# Patient Record
Sex: Female | Born: 1955 | Race: White | Hispanic: No | Marital: Single | Smoking: Never smoker
Health system: Southern US, Community
[De-identification: ages and names within clinical notes are randomized; demographics above are authoritative.]

## PROBLEM LIST (undated history)

## (undated) ENCOUNTER — Emergency Department (HOSPITAL_BASED_OUTPATIENT_CLINIC_OR_DEPARTMENT_OTHER): Admission: EM | Payer: Medicare Other | Source: Home / Self Care

## (undated) DIAGNOSIS — F1120 Opioid dependence, uncomplicated: Secondary | ICD-10-CM

## (undated) DIAGNOSIS — G43909 Migraine, unspecified, not intractable, without status migrainosus: Secondary | ICD-10-CM

## (undated) DIAGNOSIS — R7881 Bacteremia: Secondary | ICD-10-CM

## (undated) DIAGNOSIS — F419 Anxiety disorder, unspecified: Secondary | ICD-10-CM

## (undated) DIAGNOSIS — R479 Unspecified speech disturbances: Secondary | ICD-10-CM

## (undated) DIAGNOSIS — G629 Polyneuropathy, unspecified: Secondary | ICD-10-CM

## (undated) DIAGNOSIS — M51369 Other intervertebral disc degeneration, lumbar region without mention of lumbar back pain or lower extremity pain: Secondary | ICD-10-CM

## (undated) DIAGNOSIS — Z5181 Encounter for therapeutic drug level monitoring: Secondary | ICD-10-CM

## (undated) DIAGNOSIS — G479 Sleep disorder, unspecified: Secondary | ICD-10-CM

## (undated) DIAGNOSIS — E785 Hyperlipidemia, unspecified: Secondary | ICD-10-CM

## (undated) DIAGNOSIS — I10 Essential (primary) hypertension: Secondary | ICD-10-CM

## (undated) DIAGNOSIS — G253 Myoclonus: Secondary | ICD-10-CM

## (undated) DIAGNOSIS — E079 Disorder of thyroid, unspecified: Secondary | ICD-10-CM

## (undated) DIAGNOSIS — K5909 Other constipation: Secondary | ICD-10-CM

## (undated) DIAGNOSIS — F1110 Opioid abuse, uncomplicated: Secondary | ICD-10-CM

## (undated) DIAGNOSIS — J309 Allergic rhinitis, unspecified: Secondary | ICD-10-CM

## (undated) DIAGNOSIS — M1990 Unspecified osteoarthritis, unspecified site: Secondary | ICD-10-CM

## (undated) DIAGNOSIS — E119 Type 2 diabetes mellitus without complications: Secondary | ICD-10-CM

## (undated) DIAGNOSIS — T8149XA Infection following a procedure, other surgical site, initial encounter: Secondary | ICD-10-CM

## (undated) DIAGNOSIS — L930 Discoid lupus erythematosus: Secondary | ICD-10-CM

## (undated) DIAGNOSIS — Z22322 Carrier or suspected carrier of Methicillin resistant Staphylococcus aureus: Secondary | ICD-10-CM

## (undated) DIAGNOSIS — F3289 Other specified depressive episodes: Secondary | ICD-10-CM

## (undated) DIAGNOSIS — R9089 Other abnormal findings on diagnostic imaging of central nervous system: Secondary | ICD-10-CM

## (undated) DIAGNOSIS — K219 Gastro-esophageal reflux disease without esophagitis: Secondary | ICD-10-CM

## (undated) DIAGNOSIS — L409 Psoriasis, unspecified: Secondary | ICD-10-CM

## (undated) DIAGNOSIS — F6810 Factitious disorder, unspecified: Secondary | ICD-10-CM

## (undated) DIAGNOSIS — D509 Iron deficiency anemia, unspecified: Secondary | ICD-10-CM

## (undated) DIAGNOSIS — G894 Chronic pain syndrome: Secondary | ICD-10-CM

## (undated) DIAGNOSIS — T801XXA Vascular complications following infusion, transfusion and therapeutic injection, initial encounter: Secondary | ICD-10-CM

## (undated) DIAGNOSIS — G43709 Chronic migraine without aura, not intractable, without status migrainosus: Secondary | ICD-10-CM

## (undated) DIAGNOSIS — I739 Peripheral vascular disease, unspecified: Secondary | ICD-10-CM

## (undated) DIAGNOSIS — E039 Hypothyroidism, unspecified: Secondary | ICD-10-CM

## (undated) DIAGNOSIS — F609 Personality disorder, unspecified: Secondary | ICD-10-CM

## (undated) DIAGNOSIS — R51 Headache: Secondary | ICD-10-CM

## (undated) DIAGNOSIS — C787 Secondary malignant neoplasm of liver and intrahepatic bile duct: Secondary | ICD-10-CM

## (undated) DIAGNOSIS — IMO0002 Reserved for concepts with insufficient information to code with codable children: Secondary | ICD-10-CM

## (undated) DIAGNOSIS — R519 Headache, unspecified: Secondary | ICD-10-CM

## (undated) DIAGNOSIS — C50919 Malignant neoplasm of unspecified site of unspecified female breast: Secondary | ICD-10-CM

## (undated) DIAGNOSIS — M069 Rheumatoid arthritis, unspecified: Secondary | ICD-10-CM

## (undated) DIAGNOSIS — I809 Phlebitis and thrombophlebitis of unspecified site: Secondary | ICD-10-CM

## (undated) DIAGNOSIS — M5416 Radiculopathy, lumbar region: Secondary | ICD-10-CM

## (undated) DIAGNOSIS — M5136 Other intervertebral disc degeneration, lumbar region: Secondary | ICD-10-CM

## (undated) DIAGNOSIS — M329 Systemic lupus erythematosus, unspecified: Secondary | ICD-10-CM

## (undated) DIAGNOSIS — R569 Unspecified convulsions: Secondary | ICD-10-CM

## (undated) DIAGNOSIS — F329 Major depressive disorder, single episode, unspecified: Secondary | ICD-10-CM

## (undated) DIAGNOSIS — Z9181 History of falling: Secondary | ICD-10-CM

## (undated) DIAGNOSIS — M797 Fibromyalgia: Secondary | ICD-10-CM

## (undated) DIAGNOSIS — D72829 Elevated white blood cell count, unspecified: Secondary | ICD-10-CM

## (undated) DIAGNOSIS — E271 Primary adrenocortical insufficiency: Secondary | ICD-10-CM

## (undated) DIAGNOSIS — A6920 Lyme disease, unspecified: Secondary | ICD-10-CM

## (undated) DIAGNOSIS — I8290 Acute embolism and thrombosis of unspecified vein: Secondary | ICD-10-CM

## (undated) HISTORY — PX: PERIPHERALLY INSERTED CENTRAL CATHETER INSERTION: SHX2221

## (undated) HISTORY — DX: Elevated white blood cell count, unspecified: D72.829

## (undated) HISTORY — DX: Infection following a procedure, other surgical site, initial encounter: T81.49XA

## (undated) HISTORY — DX: Lyme disease, unspecified: A69.20

## (undated) HISTORY — DX: Malignant neoplasm of unspecified site of unspecified female breast: C50.919

## (undated) HISTORY — PX: OTHER SURGICAL HISTORY: SHX169

## (undated) HISTORY — DX: Other specified depressive episodes: F32.89

## (undated) HISTORY — DX: Disorder of thyroid, unspecified: E07.9

## (undated) HISTORY — DX: Personality disorder, unspecified: F60.9

## (undated) HISTORY — DX: Peripheral vascular disease, unspecified: I73.9

## (undated) HISTORY — DX: Iron deficiency anemia, unspecified: D50.9

## (undated) HISTORY — DX: Migraine, unspecified, not intractable, without status migrainosus: G43.909

## (undated) HISTORY — DX: Opioid dependence, uncomplicated: F11.20

## (undated) HISTORY — DX: Fibromyalgia: M79.7

## (undated) HISTORY — DX: Secondary malignant neoplasm of liver and intrahepatic bile duct: C78.7

## (undated) HISTORY — DX: Encounter for therapeutic drug level monitoring: Z51.81

## (undated) HISTORY — DX: Psoriasis, unspecified: L40.9

## (undated) HISTORY — DX: Hyperlipidemia, unspecified: E78.5

## (undated) HISTORY — DX: Unspecified convulsions: R56.9

## (undated) HISTORY — DX: Unspecified osteoarthritis, unspecified site: M19.90

## (undated) HISTORY — DX: Polyneuropathy, unspecified: G62.9

## (undated) HISTORY — DX: Other intervertebral disc degeneration, lumbar region: M51.36

## (undated) HISTORY — PX: I & D EXTREMITY: SHX5045

## (undated) HISTORY — DX: Radiculopathy, lumbar region: M54.16

## (undated) HISTORY — DX: Unspecified speech disturbances: R47.9

## (undated) HISTORY — DX: Myoclonus: G25.3

## (undated) HISTORY — DX: History of falling: Z91.81

## (undated) HISTORY — DX: Discoid lupus erythematosus: L93.0

## (undated) HISTORY — DX: Other intervertebral disc degeneration, lumbar region without mention of lumbar back pain or lower extremity pain: M51.369

## (undated) HISTORY — DX: Sleep disorder, unspecified: G47.9

## (undated) HISTORY — DX: Other abnormal findings on diagnostic imaging of central nervous system: R90.89

## (undated) HISTORY — DX: Major depressive disorder, single episode, unspecified: F32.9

## (undated) HISTORY — DX: Headache, unspecified: R51.9

## (undated) HISTORY — DX: Headache: R51

## (undated) HISTORY — DX: Carrier or suspected carrier of methicillin resistant Staphylococcus aureus: Z22.322

## (undated) HISTORY — DX: Bacteremia: R78.81

## (undated) HISTORY — DX: Chronic pain syndrome: G89.4

## (undated) HISTORY — DX: Gastro-esophageal reflux disease without esophagitis: K21.9

## (undated) HISTORY — DX: Chronic migraine without aura, not intractable, without status migrainosus: G43.709

## (undated) HISTORY — DX: Type 2 diabetes mellitus without complications: E11.9

## (undated) HISTORY — DX: Other constipation: K59.09

## (undated) HISTORY — DX: Opioid abuse, uncomplicated: F11.10

## (undated) HISTORY — DX: Essential (primary) hypertension: I10

## (undated) HISTORY — DX: Allergic rhinitis, unspecified: J30.9

## (undated) HISTORY — DX: Primary adrenocortical insufficiency: E27.1

## (undated) HISTORY — DX: Factitious disorder imposed on self, unspecified: F68.10

## (undated) HISTORY — DX: Systemic lupus erythematosus, unspecified: M32.9

---

## 1999-11-21 ENCOUNTER — Emergency Department (HOSPITAL_COMMUNITY): Admission: EM | Admit: 1999-11-21 | Discharge: 1999-11-22 | Payer: Self-pay | Admitting: Emergency Medicine

## 1999-11-23 ENCOUNTER — Emergency Department (HOSPITAL_COMMUNITY): Admission: EM | Admit: 1999-11-23 | Discharge: 1999-11-23 | Payer: Self-pay | Admitting: Emergency Medicine

## 2004-08-14 ENCOUNTER — Emergency Department (HOSPITAL_COMMUNITY): Admission: EM | Admit: 2004-08-14 | Discharge: 2004-08-15 | Payer: Self-pay | Admitting: Emergency Medicine

## 2006-09-25 ENCOUNTER — Emergency Department (HOSPITAL_COMMUNITY): Admission: EM | Admit: 2006-09-25 | Discharge: 2006-09-25 | Payer: Self-pay | Admitting: Emergency Medicine

## 2007-08-12 ENCOUNTER — Emergency Department (HOSPITAL_BASED_OUTPATIENT_CLINIC_OR_DEPARTMENT_OTHER): Admission: EM | Admit: 2007-08-12 | Discharge: 2007-08-12 | Payer: Self-pay | Admitting: Emergency Medicine

## 2008-08-09 ENCOUNTER — Emergency Department (HOSPITAL_COMMUNITY): Admission: EM | Admit: 2008-08-09 | Discharge: 2008-08-09 | Payer: Self-pay | Admitting: Emergency Medicine

## 2009-01-11 ENCOUNTER — Emergency Department (HOSPITAL_BASED_OUTPATIENT_CLINIC_OR_DEPARTMENT_OTHER): Admission: EM | Admit: 2009-01-11 | Discharge: 2009-01-11 | Payer: Self-pay | Admitting: Emergency Medicine

## 2009-01-25 ENCOUNTER — Emergency Department (HOSPITAL_BASED_OUTPATIENT_CLINIC_OR_DEPARTMENT_OTHER): Admission: EM | Admit: 2009-01-25 | Discharge: 2009-01-26 | Payer: Self-pay | Admitting: Emergency Medicine

## 2009-02-07 ENCOUNTER — Emergency Department (HOSPITAL_BASED_OUTPATIENT_CLINIC_OR_DEPARTMENT_OTHER)
Admission: EM | Admit: 2009-02-07 | Discharge: 2009-02-07 | Payer: Self-pay | Source: Home / Self Care | Admitting: Emergency Medicine

## 2009-02-14 ENCOUNTER — Emergency Department (HOSPITAL_COMMUNITY): Admission: EM | Admit: 2009-02-14 | Discharge: 2009-02-14 | Payer: Self-pay | Admitting: Emergency Medicine

## 2009-02-18 ENCOUNTER — Emergency Department (HOSPITAL_COMMUNITY): Admission: EM | Admit: 2009-02-18 | Discharge: 2009-02-19 | Payer: Self-pay | Admitting: Emergency Medicine

## 2009-02-27 ENCOUNTER — Emergency Department (HOSPITAL_COMMUNITY): Admission: EM | Admit: 2009-02-27 | Discharge: 2009-02-28 | Payer: Self-pay | Admitting: Emergency Medicine

## 2009-03-04 ENCOUNTER — Emergency Department (HOSPITAL_BASED_OUTPATIENT_CLINIC_OR_DEPARTMENT_OTHER): Admission: EM | Admit: 2009-03-04 | Discharge: 2009-03-05 | Payer: Self-pay | Admitting: Emergency Medicine

## 2009-03-08 ENCOUNTER — Ambulatory Visit: Payer: Self-pay | Admitting: Diagnostic Radiology

## 2009-03-08 ENCOUNTER — Encounter: Payer: Self-pay | Admitting: Emergency Medicine

## 2009-03-08 ENCOUNTER — Inpatient Hospital Stay (HOSPITAL_COMMUNITY): Admission: EM | Admit: 2009-03-08 | Discharge: 2009-03-12 | Payer: Self-pay | Admitting: Internal Medicine

## 2009-03-19 ENCOUNTER — Emergency Department (HOSPITAL_BASED_OUTPATIENT_CLINIC_OR_DEPARTMENT_OTHER): Admission: EM | Admit: 2009-03-19 | Discharge: 2009-03-19 | Payer: Self-pay | Admitting: Emergency Medicine

## 2009-04-22 ENCOUNTER — Observation Stay (HOSPITAL_COMMUNITY): Admission: EM | Admit: 2009-04-22 | Discharge: 2009-04-22 | Payer: Self-pay | Admitting: Emergency Medicine

## 2009-04-29 ENCOUNTER — Encounter: Admission: RE | Admit: 2009-04-29 | Discharge: 2009-05-04 | Payer: Self-pay | Admitting: Orthopedic Surgery

## 2009-05-04 ENCOUNTER — Ambulatory Visit: Payer: Self-pay | Admitting: Diagnostic Radiology

## 2009-05-04 ENCOUNTER — Encounter: Payer: Self-pay | Admitting: Emergency Medicine

## 2009-05-05 ENCOUNTER — Inpatient Hospital Stay (HOSPITAL_COMMUNITY): Admission: EM | Admit: 2009-05-05 | Discharge: 2009-05-09 | Payer: Self-pay

## 2009-05-21 ENCOUNTER — Emergency Department (HOSPITAL_BASED_OUTPATIENT_CLINIC_OR_DEPARTMENT_OTHER): Admission: EM | Admit: 2009-05-21 | Discharge: 2009-05-21 | Payer: Self-pay | Admitting: Emergency Medicine

## 2009-05-21 ENCOUNTER — Ambulatory Visit: Payer: Self-pay | Admitting: Diagnostic Radiology

## 2009-06-04 ENCOUNTER — Emergency Department (HOSPITAL_BASED_OUTPATIENT_CLINIC_OR_DEPARTMENT_OTHER): Admission: EM | Admit: 2009-06-04 | Discharge: 2009-06-05 | Payer: Self-pay | Admitting: Emergency Medicine

## 2009-06-10 ENCOUNTER — Ambulatory Visit: Payer: Self-pay | Admitting: Diagnostic Radiology

## 2009-06-10 ENCOUNTER — Emergency Department (HOSPITAL_BASED_OUTPATIENT_CLINIC_OR_DEPARTMENT_OTHER): Admission: EM | Admit: 2009-06-10 | Discharge: 2009-06-10 | Payer: Self-pay | Admitting: Emergency Medicine

## 2009-06-19 ENCOUNTER — Emergency Department (HOSPITAL_BASED_OUTPATIENT_CLINIC_OR_DEPARTMENT_OTHER): Admission: EM | Admit: 2009-06-19 | Discharge: 2009-06-20 | Payer: Self-pay | Admitting: Emergency Medicine

## 2009-06-24 ENCOUNTER — Emergency Department (HOSPITAL_BASED_OUTPATIENT_CLINIC_OR_DEPARTMENT_OTHER): Admission: EM | Admit: 2009-06-24 | Discharge: 2009-06-25 | Payer: Self-pay | Admitting: Emergency Medicine

## 2009-06-25 ENCOUNTER — Ambulatory Visit: Payer: Self-pay | Admitting: Radiology

## 2009-07-27 ENCOUNTER — Emergency Department (HOSPITAL_BASED_OUTPATIENT_CLINIC_OR_DEPARTMENT_OTHER): Admission: EM | Admit: 2009-07-27 | Discharge: 2009-07-27 | Payer: Self-pay | Admitting: Emergency Medicine

## 2009-07-29 ENCOUNTER — Emergency Department (HOSPITAL_BASED_OUTPATIENT_CLINIC_OR_DEPARTMENT_OTHER): Admission: EM | Admit: 2009-07-29 | Discharge: 2009-07-30 | Payer: Self-pay | Admitting: Emergency Medicine

## 2009-08-06 ENCOUNTER — Emergency Department (HOSPITAL_BASED_OUTPATIENT_CLINIC_OR_DEPARTMENT_OTHER): Admission: EM | Admit: 2009-08-06 | Discharge: 2009-08-06 | Payer: Self-pay | Admitting: Emergency Medicine

## 2009-08-08 ENCOUNTER — Inpatient Hospital Stay (HOSPITAL_COMMUNITY): Admission: EM | Admit: 2009-08-08 | Discharge: 2009-08-14 | Payer: Self-pay | Admitting: Internal Medicine

## 2009-08-08 ENCOUNTER — Encounter: Payer: Self-pay | Admitting: Internal Medicine

## 2009-08-08 ENCOUNTER — Ambulatory Visit: Payer: Self-pay | Admitting: Cardiovascular Disease

## 2009-08-08 ENCOUNTER — Encounter: Payer: Self-pay | Admitting: Emergency Medicine

## 2009-08-08 ENCOUNTER — Ambulatory Visit: Payer: Self-pay | Admitting: Diagnostic Radiology

## 2009-08-08 LAB — CONVERTED CEMR LAB: Hgb A1c MFr Bld: 6.7 %

## 2009-08-09 ENCOUNTER — Encounter: Payer: Self-pay | Admitting: Internal Medicine

## 2009-08-09 LAB — CONVERTED CEMR LAB
HDL: 88 mg/dL
Triglyceride fasting, serum: 109 mg/dL

## 2009-08-11 ENCOUNTER — Encounter (INDEPENDENT_AMBULATORY_CARE_PROVIDER_SITE_OTHER): Payer: Self-pay | Admitting: Internal Medicine

## 2009-08-11 ENCOUNTER — Ambulatory Visit: Payer: Self-pay | Admitting: Surgery

## 2009-08-12 ENCOUNTER — Ambulatory Visit: Payer: Self-pay | Admitting: Physical Medicine & Rehabilitation

## 2009-08-13 ENCOUNTER — Ambulatory Visit: Payer: Self-pay | Admitting: Physical Medicine & Rehabilitation

## 2009-08-14 ENCOUNTER — Encounter (INDEPENDENT_AMBULATORY_CARE_PROVIDER_SITE_OTHER): Payer: Self-pay | Admitting: Internal Medicine

## 2009-08-14 ENCOUNTER — Encounter: Payer: Self-pay | Admitting: Internal Medicine

## 2009-08-14 LAB — CONVERTED CEMR LAB
HCT: 29.6 %
Hemoglobin: 9.2 g/dL
Platelets: 221 10*3/uL
RBC: 3.9 M/uL
WBC: 7 10*3/uL

## 2009-08-15 LAB — HM MAMMOGRAPHY: HM Mammogram: NORMAL

## 2009-08-24 ENCOUNTER — Emergency Department (HOSPITAL_BASED_OUTPATIENT_CLINIC_OR_DEPARTMENT_OTHER): Admission: EM | Admit: 2009-08-24 | Discharge: 2009-08-25 | Payer: Self-pay | Admitting: Emergency Medicine

## 2009-08-26 ENCOUNTER — Emergency Department (HOSPITAL_BASED_OUTPATIENT_CLINIC_OR_DEPARTMENT_OTHER): Admission: EM | Admit: 2009-08-26 | Discharge: 2009-08-26 | Payer: Self-pay | Admitting: Emergency Medicine

## 2009-08-31 ENCOUNTER — Emergency Department (HOSPITAL_BASED_OUTPATIENT_CLINIC_OR_DEPARTMENT_OTHER): Admission: EM | Admit: 2009-08-31 | Discharge: 2009-08-31 | Payer: Self-pay | Admitting: Emergency Medicine

## 2009-08-31 ENCOUNTER — Ambulatory Visit: Payer: Self-pay | Admitting: Diagnostic Radiology

## 2009-09-01 ENCOUNTER — Encounter: Payer: Self-pay | Admitting: Internal Medicine

## 2009-09-01 DIAGNOSIS — F609 Personality disorder, unspecified: Secondary | ICD-10-CM | POA: Insufficient documentation

## 2009-09-01 DIAGNOSIS — N259 Disorder resulting from impaired renal tubular function, unspecified: Secondary | ICD-10-CM | POA: Insufficient documentation

## 2009-09-01 DIAGNOSIS — F339 Major depressive disorder, recurrent, unspecified: Secondary | ICD-10-CM

## 2009-09-01 LAB — CONVERTED CEMR LAB
AST: 16 units/L
BUN: 9 mg/dL
Calcium: 8.7 mg/dL
Creatinine, Ser: 1.2 mg/dL
HCT: 28.9 %
Hemoglobin: 8.9 g/dL
Potassium: 3.4 meq/L
RDW: 18.1 %
Total Bilirubin: 0.4 mg/dL
Total Protein: 6.3 g/dL

## 2009-09-05 ENCOUNTER — Ambulatory Visit: Payer: Self-pay | Admitting: Diagnostic Radiology

## 2009-09-05 ENCOUNTER — Emergency Department (HOSPITAL_BASED_OUTPATIENT_CLINIC_OR_DEPARTMENT_OTHER): Admission: EM | Admit: 2009-09-05 | Discharge: 2009-09-05 | Payer: Self-pay | Admitting: Emergency Medicine

## 2009-09-07 ENCOUNTER — Ambulatory Visit: Payer: Self-pay | Admitting: Internal Medicine

## 2009-09-07 ENCOUNTER — Telehealth: Payer: Self-pay | Admitting: Internal Medicine

## 2009-09-07 DIAGNOSIS — D509 Iron deficiency anemia, unspecified: Secondary | ICD-10-CM

## 2009-09-07 DIAGNOSIS — K219 Gastro-esophageal reflux disease without esophagitis: Secondary | ICD-10-CM

## 2009-09-07 DIAGNOSIS — J309 Allergic rhinitis, unspecified: Secondary | ICD-10-CM | POA: Insufficient documentation

## 2009-09-07 DIAGNOSIS — R569 Unspecified convulsions: Secondary | ICD-10-CM

## 2009-09-07 DIAGNOSIS — E785 Hyperlipidemia, unspecified: Secondary | ICD-10-CM

## 2009-09-07 DIAGNOSIS — C50919 Malignant neoplasm of unspecified site of unspecified female breast: Secondary | ICD-10-CM | POA: Insufficient documentation

## 2009-09-07 DIAGNOSIS — G43909 Migraine, unspecified, not intractable, without status migrainosus: Secondary | ICD-10-CM | POA: Insufficient documentation

## 2009-09-07 DIAGNOSIS — IMO0002 Reserved for concepts with insufficient information to code with codable children: Secondary | ICD-10-CM

## 2009-09-07 DIAGNOSIS — G894 Chronic pain syndrome: Secondary | ICD-10-CM | POA: Insufficient documentation

## 2009-09-07 DIAGNOSIS — E119 Type 2 diabetes mellitus without complications: Secondary | ICD-10-CM | POA: Insufficient documentation

## 2009-09-07 HISTORY — PX: OTHER SURGICAL HISTORY: SHX169

## 2009-09-07 HISTORY — PX: BREAST SURGERY: SHX581

## 2009-09-08 ENCOUNTER — Emergency Department (HOSPITAL_BASED_OUTPATIENT_CLINIC_OR_DEPARTMENT_OTHER): Admission: EM | Admit: 2009-09-08 | Discharge: 2009-09-09 | Payer: Self-pay | Admitting: Emergency Medicine

## 2009-09-12 ENCOUNTER — Emergency Department (HOSPITAL_BASED_OUTPATIENT_CLINIC_OR_DEPARTMENT_OTHER): Admission: EM | Admit: 2009-09-12 | Discharge: 2009-09-12 | Payer: Self-pay | Admitting: Emergency Medicine

## 2009-09-14 ENCOUNTER — Encounter: Payer: Self-pay | Admitting: Internal Medicine

## 2009-09-16 ENCOUNTER — Encounter: Payer: Self-pay | Admitting: Internal Medicine

## 2009-09-17 ENCOUNTER — Telehealth: Payer: Self-pay | Admitting: Internal Medicine

## 2009-09-28 ENCOUNTER — Emergency Department (HOSPITAL_BASED_OUTPATIENT_CLINIC_OR_DEPARTMENT_OTHER): Admission: EM | Admit: 2009-09-28 | Discharge: 2009-09-29 | Payer: Self-pay | Admitting: Emergency Medicine

## 2009-10-13 ENCOUNTER — Encounter: Payer: Self-pay | Admitting: Internal Medicine

## 2009-11-11 ENCOUNTER — Emergency Department (HOSPITAL_BASED_OUTPATIENT_CLINIC_OR_DEPARTMENT_OTHER): Admission: EM | Admit: 2009-11-11 | Discharge: 2009-11-11 | Payer: Self-pay | Admitting: Emergency Medicine

## 2009-11-14 ENCOUNTER — Emergency Department (HOSPITAL_BASED_OUTPATIENT_CLINIC_OR_DEPARTMENT_OTHER): Admission: EM | Admit: 2009-11-14 | Discharge: 2009-11-15 | Payer: Self-pay | Admitting: Emergency Medicine

## 2009-11-27 ENCOUNTER — Emergency Department (HOSPITAL_BASED_OUTPATIENT_CLINIC_OR_DEPARTMENT_OTHER): Admission: EM | Admit: 2009-11-27 | Discharge: 2009-11-27 | Payer: Self-pay | Admitting: Emergency Medicine

## 2009-12-07 ENCOUNTER — Encounter: Payer: Self-pay | Admitting: Internal Medicine

## 2009-12-11 ENCOUNTER — Telehealth: Payer: Self-pay | Admitting: Internal Medicine

## 2009-12-12 ENCOUNTER — Emergency Department (HOSPITAL_BASED_OUTPATIENT_CLINIC_OR_DEPARTMENT_OTHER): Admission: EM | Admit: 2009-12-12 | Discharge: 2009-12-13 | Payer: Self-pay | Admitting: Emergency Medicine

## 2009-12-14 ENCOUNTER — Telehealth: Payer: Self-pay | Admitting: Internal Medicine

## 2009-12-17 ENCOUNTER — Emergency Department (HOSPITAL_BASED_OUTPATIENT_CLINIC_OR_DEPARTMENT_OTHER): Admission: EM | Admit: 2009-12-17 | Discharge: 2009-12-17 | Payer: Self-pay | Admitting: Emergency Medicine

## 2009-12-19 ENCOUNTER — Emergency Department (HOSPITAL_BASED_OUTPATIENT_CLINIC_OR_DEPARTMENT_OTHER): Admission: EM | Admit: 2009-12-19 | Discharge: 2009-12-19 | Payer: Self-pay | Admitting: Emergency Medicine

## 2009-12-26 ENCOUNTER — Emergency Department (HOSPITAL_BASED_OUTPATIENT_CLINIC_OR_DEPARTMENT_OTHER): Admission: EM | Admit: 2009-12-26 | Discharge: 2009-12-26 | Payer: Self-pay | Admitting: Emergency Medicine

## 2009-12-29 ENCOUNTER — Emergency Department (HOSPITAL_BASED_OUTPATIENT_CLINIC_OR_DEPARTMENT_OTHER): Admission: EM | Admit: 2009-12-29 | Discharge: 2009-12-29 | Payer: Self-pay | Admitting: Emergency Medicine

## 2010-01-01 ENCOUNTER — Emergency Department (HOSPITAL_BASED_OUTPATIENT_CLINIC_OR_DEPARTMENT_OTHER): Admission: EM | Admit: 2010-01-01 | Discharge: 2010-01-01 | Payer: Self-pay | Admitting: Emergency Medicine

## 2010-01-03 ENCOUNTER — Emergency Department (HOSPITAL_BASED_OUTPATIENT_CLINIC_OR_DEPARTMENT_OTHER): Admission: EM | Admit: 2010-01-03 | Discharge: 2010-01-03 | Payer: Self-pay | Admitting: Emergency Medicine

## 2010-01-04 ENCOUNTER — Encounter: Payer: Self-pay | Admitting: Internal Medicine

## 2010-01-05 ENCOUNTER — Encounter: Payer: Self-pay | Admitting: Internal Medicine

## 2010-01-09 ENCOUNTER — Emergency Department (HOSPITAL_BASED_OUTPATIENT_CLINIC_OR_DEPARTMENT_OTHER): Admission: EM | Admit: 2010-01-09 | Discharge: 2010-01-09 | Payer: Self-pay | Admitting: Emergency Medicine

## 2010-01-13 ENCOUNTER — Encounter: Payer: Self-pay | Admitting: Internal Medicine

## 2010-01-14 ENCOUNTER — Emergency Department (HOSPITAL_COMMUNITY): Admission: EM | Admit: 2010-01-14 | Discharge: 2009-07-25 | Payer: Self-pay | Admitting: Emergency Medicine

## 2010-01-14 ENCOUNTER — Emergency Department (HOSPITAL_BASED_OUTPATIENT_CLINIC_OR_DEPARTMENT_OTHER): Admission: EM | Admit: 2010-01-14 | Discharge: 2009-06-01 | Payer: Self-pay | Admitting: Emergency Medicine

## 2010-01-23 ENCOUNTER — Emergency Department (HOSPITAL_BASED_OUTPATIENT_CLINIC_OR_DEPARTMENT_OTHER)
Admission: EM | Admit: 2010-01-23 | Discharge: 2010-01-23 | Payer: Self-pay | Source: Home / Self Care | Admitting: Emergency Medicine

## 2010-01-31 ENCOUNTER — Emergency Department (HOSPITAL_BASED_OUTPATIENT_CLINIC_OR_DEPARTMENT_OTHER)
Admission: EM | Admit: 2010-01-31 | Discharge: 2010-01-31 | Payer: Self-pay | Source: Home / Self Care | Admitting: Emergency Medicine

## 2010-02-12 ENCOUNTER — Emergency Department (HOSPITAL_BASED_OUTPATIENT_CLINIC_OR_DEPARTMENT_OTHER)
Admission: EM | Admit: 2010-02-12 | Discharge: 2010-02-13 | Payer: Self-pay | Source: Home / Self Care | Admitting: Emergency Medicine

## 2010-02-13 ENCOUNTER — Emergency Department (HOSPITAL_BASED_OUTPATIENT_CLINIC_OR_DEPARTMENT_OTHER)
Admission: EM | Admit: 2010-02-13 | Discharge: 2010-02-14 | Payer: Self-pay | Source: Home / Self Care | Admitting: Emergency Medicine

## 2010-02-16 ENCOUNTER — Emergency Department (HOSPITAL_BASED_OUTPATIENT_CLINIC_OR_DEPARTMENT_OTHER)
Admission: EM | Admit: 2010-02-16 | Discharge: 2010-02-17 | Payer: Self-pay | Source: Home / Self Care | Admitting: Emergency Medicine

## 2010-02-20 ENCOUNTER — Emergency Department (HOSPITAL_BASED_OUTPATIENT_CLINIC_OR_DEPARTMENT_OTHER)
Admission: EM | Admit: 2010-02-20 | Discharge: 2010-02-21 | Payer: Self-pay | Source: Home / Self Care | Admitting: Emergency Medicine

## 2010-02-22 LAB — CBC
HCT: 39.1 % (ref 36.0–46.0)
Hemoglobin: 12.9 g/dL (ref 12.0–15.0)
MCH: 28.5 pg (ref 26.0–34.0)
MCHC: 33 g/dL (ref 30.0–36.0)
MCV: 86.5 fL (ref 78.0–100.0)
Platelets: 197 10*3/uL (ref 150–400)
RBC: 4.52 MIL/uL (ref 3.87–5.11)
RDW: 14.8 % (ref 11.5–15.5)
WBC: 7.3 10*3/uL (ref 4.0–10.5)

## 2010-02-22 LAB — DIFFERENTIAL
Basophils Absolute: 0 10*3/uL (ref 0.0–0.1)
Basophils Relative: 0 % (ref 0–1)
Eosinophils Absolute: 0.1 10*3/uL (ref 0.0–0.7)
Eosinophils Relative: 1 % (ref 0–5)
Lymphocytes Relative: 13 % (ref 12–46)
Lymphs Abs: 0.9 10*3/uL (ref 0.7–4.0)
Monocytes Absolute: 0.6 10*3/uL (ref 0.1–1.0)
Monocytes Relative: 8 % (ref 3–12)
Neutro Abs: 5.7 10*3/uL (ref 1.7–7.7)
Neutrophils Relative %: 79 % — ABNORMAL HIGH (ref 43–77)

## 2010-02-22 LAB — BASIC METABOLIC PANEL
BUN: 13 mg/dL (ref 6–23)
CO2: 30 mEq/L (ref 19–32)
Calcium: 8.5 mg/dL (ref 8.4–10.5)
Chloride: 94 mEq/L — ABNORMAL LOW (ref 96–112)
Creatinine, Ser: 0.9 mg/dL (ref 0.4–1.2)
GFR calc Af Amer: >60 mL/min (ref >60)
GFR calc non Af Amer: >60 mL/min (ref >60)
Glucose, Bld: 129 mg/dL — ABNORMAL HIGH (ref 70–99)
Potassium: 3.1 mEq/L — ABNORMAL LOW (ref 3.5–5.1)
Sodium: 138 mEq/L (ref 135–145)

## 2010-02-27 ENCOUNTER — Emergency Department (HOSPITAL_BASED_OUTPATIENT_CLINIC_OR_DEPARTMENT_OTHER)
Admission: EM | Admit: 2010-02-27 | Discharge: 2010-02-27 | Payer: Self-pay | Source: Home / Self Care | Admitting: Emergency Medicine

## 2010-02-27 ENCOUNTER — Emergency Department (HOSPITAL_COMMUNITY)
Admission: EM | Admit: 2010-02-27 | Discharge: 2010-02-28 | Payer: Self-pay | Source: Home / Self Care | Admitting: Emergency Medicine

## 2010-03-09 NOTE — Progress Notes (Signed)
  Phone Note Refill Request Message from:  Patient on December 11, 2009 3:07 PM  Refills Requested: Medication #1:  PREDNISONE 5 MG TABS take 1 by mouth once daily  Medication #2:  TRAZODONE HCL 150 MG TABS take 2 at bedtime RXs prescribed by previous MD   Method Requested: Electronic Initial call taken by: Margaret Pyle, CMA,  December 11, 2009 3:07 PM  Follow-up for Phone Call        ok to prescribe both as requested, each with 6 month refills Follow-up by: Newt Lukes MD,  December 11, 2009 4:37 PM    Prescriptions: TRAZODONE HCL 150 MG TABS (TRAZODONE HCL) take 2 at bedtime  #60 x 5   Entered by:   Margaret Pyle, CMA   Authorized by:   Newt Lukes MD   Signed by:   Margaret Pyle, CMA on 12/11/2009   Method used:   Electronically to        [facility] [phone]* (retail)       [address]       [place], [place]  [address]       Ph: [phone]       Fax: [phone]   RxID:   [id number] PREDNISONE 5 MG TABS (PREDNISONE) take 1 by mouth once daily  #30 x 5   Entered by:   Margaret Pyle, CMA   Authorized by:   Newt Lukes MD   Signed by:   Margaret Pyle, CMA on 12/11/2009   Method used:   Electronically to        [facility] [phone]* (retail)       [address]       [place], [place]  [address]       Ph: [phone]       Fax: [phone]   RxID:   [id number]

## 2010-03-09 NOTE — Letter (Signed)
[facility]   Imported By: Sherian Rein 09/25/2009 15:07:40  _____________________________________________________________________  External Attachment:    Type:   Image     Comment:   External Document

## 2010-03-09 NOTE — Letter (Signed)
Summary: [facility]N for Diabetes Supplies/[facility]  [facility] for [facility] Supplies/[facility]   Imported By: Sherian Rein 01/08/2010 08:33:37  _____________________________________________________________________  External Attachment:    Type:   Image     Comment:   External Document

## 2010-03-09 NOTE — Assessment & Plan Note (Signed)
Summary: NEW / MEDICARE,MEDICAID/#/CD   Vital Signs:  Patient profile:   55 year old female Height:      64.5 inches (163.83 cm) Weight:      197.8 pounds (89.91 kg) BMI:     33.55 O2 Sat:      97 % on Room air Temp:     98.8 degrees F (37.11 degrees C) oral Pulse rate:   87 / minute BP sitting:   128 / 72  (left arm) Cuff size:   large  Vitals Entered By: Orlan Leavens RMA (September 07, 2009 1:19 PM)  O2 Flow:  Room air CC: New patient/ D/c from hosp 08/14/09 Is Patient Diabetic? No Pain Assessment Patient in pain? no        Primary Care Provider:  Newt Lukes MD  CC:  New patient/ D/c from hosp 08/14/09.  History of Present Illness: new pt to me and our practice, here to est care  1) hosp 7/2-7/8 - CP at [facility] - neg r/o MI - feels most her pain is realted to pericarditis from SLE - follows with rheum for same at [facility]  2) hx SLE - on chronic pred for same -   3) chronic back pain - chronic narc dep issues - follows with [facility] but would like different pain mgmt center if available -  has seen ortho spine and has 7 reuptured disks but not surgical canidate per her report  4) reports n/v - daily with any oral intake - planning eval and tx of same with GI in [place] - no weight loss  5) new dx breast cancer  08/25/09, reports large mass on right side just noted in past 2 weeks - planning for double mastectomy per her report - seeing gen surg in [place] - OV 8/8 to discuss surg  6) c/o pain in left  lateral upper arm - onset 3 days ago - precipitated by benadryl shot given for pain c/o daily fever and chills - a/w redness and warmth and tender to touch- +hx sme - knots and "necrosis" thru out butttocks from similar shots in past  Preventive Screening-Counseling & Management  Alcohol-Tobacco     Alcohol drinks/day: 0     Alcohol Counseling: not indicated; patient does not drink     Smoking Status: never     Tobacco Counseling: not indicated; no tobacco  use  Caffeine-Diet-Exercise     Does Patient Exercise: no     Exercise Counseling: to improve exercise regimen     Depression Counseling: not indicated; screening negative for depression  Safety-Violence-Falls     Seat Belt Counseling: not indicated; patient wears seat belts     Helmet Counseling: not indicated; patient wears helmet when riding bicycle/motocycle     Firearms in the Home: no firearms in the home     Firearm Counseling: not indicated; uses recommended firearm safety measures     Smoke Detectors: yes     Violence in the Home: no risk noted  Clinical Review Panels:  Prevention   Last Mammogram:  No specific mammographic evidence of malignancy.   (08/25/2009)  Immunizations   Last Tetanus Booster:  Historical (02/07/2006)   Last Pneumovax:  Historical (02/08/2008)  Lipid Management   Cholesterol:  272 (08/09/2009)   LDL (bad choesterol):  162 (08/09/2009)   HDL (good cholesterol):  88 (08/09/2009)   Triglycerides:  109 (08/09/2009)  Diabetes Management   HgBA1C:  6.7 (08/08/2009)   Creatinine:  1.20 (09/01/2009)   Last Pneumovax:  Historical (02/08/2008)  CBC   WBC:  9.6 (09/01/2009)   RBC:  3.82 (09/01/2009)   Hgb:  8.9 (09/01/2009)   Hct:  28.9 (09/01/2009)   Platelets:  231 (09/01/2009)   MCV  75.7 (09/01/2009)   RDW  18.1 (09/01/2009)   PMN:  74 (09/01/2009)   Monos:  7 (09/01/2009)   Eosinophils:  1 (09/01/2009)   Basophil:  3 (09/01/2009)  Complete Metabolic Panel   Glucose:  87 (09/01/2009)   Sodium:  140 (09/01/2009)   Potassium:  3.4 (09/01/2009)   Chloride:  104 (09/01/2009)   CO2:  27 (09/01/2009)   BUN:  9 (09/01/2009)   Creatinine:  1.20 (09/01/2009)   Albumin:  3.4 (09/01/2009)   Total Protein:  6.3 (09/01/2009)   Calcium:  8.7 (09/01/2009)   Total Bili:  0.4 (09/01/2009)   Alk Phos:  76 (09/01/2009)   SGPT (ALT):  17 (09/01/2009)   SGOT (AST):  16 (09/01/2009)   Current Medications (verified): 1)  Reglan 10 Mg Tabs  (Metoclopramide Hcl) .... Take 1 Before Meals and Bedtime Prn 2)  Duragesic-75 75 Mcg/hr Pt72 (Fentanyl) .... Use Transdermally Q 3 Days 3)  Neurontin 300 Mg Caps (Gabapentin) .... Take 3 Three Times A Day 4)  Klonopin 1 Mg Tabs (Clonazepam) .... Take 1-2 At Bedtime 5)  Prednisone 5 Mg Tabs (Prednisone) .... Take 1 By Mouth Once Daily 6)  Klor-Con M20 20 Meq Cr-Tabs (Potassium Chloride Crys Cr) .... Take 1 Two Times A Day 7)  Trazodone Hcl 150 Mg Tabs (Trazodone Hcl) .... Take 2 At Bedtime 8)  Probiotic  Caps (Probiotic Product) .... Take 1-3 Two Times A Day 9)  Topamax 100 Mg Tabs (Topiramate) .... Take 1 Q Am and 2 At Bedtime 10)  Hydrocodone-Acetaminophen 10-325 Mg Tabs (Hydrocodone-Acetaminophen) .... Take 1-2 By Mouth Once Daily As Needed 11)  Cymbalta 60 Mg Cpep (Duloxetine Hcl) .... Take 1 By Mouth Two Times A Day 12)  Prilosec 20 Mg Cpdr (Omeprazole) .... Take 1 Two Times A Day 13)  Colace 100 Mg Caps (Docusate Sodium) .... Take 2 Two Times A Day 14)  Soma 350 Mg Tabs (Carisoprodol) .... Take 1-4 By Mouth Once Daily As Needed 15)  Aldactazide 25-25 Mg Tabs (Spironolactone-Hctz) .... Take 1 Two Times A Day  Allergies (verified): 1)  ! Nsaids 2)  ! * Biactin 3)  ! Nubain 4)  ! Methotrexate 5)  ! * Contrast Dye 6)  ! Toradol 7)  ! * Amonia 8)  ! Celebrex 9)  ! * Remicade 10)  ! 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 Insuff - d/t chronic pred migraines  MD roster: ortho - [facility] - [facility] - [facility]) neuro - [facility] ([place]) rheum - [facility] ([facility]) gyn - cope ([place]) GI - draelos  Past Surgical History: Breast biospy (08/2009)  Family History: Family History  of Arthritis Family History Diabetes 1st degree relative Family History High cholesterol Family History Hypertension Family History Lung cancer (both parents) mom expired late 3s yo - lung ca - smoker dad expired 70yo - lung ca -smoker  Social History: Never Smoked, no alcohol single, lives alone disabled - former  nurse/EMT/youth minister Smoking Status:  never Does Patient Exercise:  no  Review of Systems       The patient complains of anorexia, fever, hoarseness, syncope, peripheral edema, headaches, abdominal pain, severe indigestion/heartburn, incontinence, suspicious skin lesions, unusual weight change, and enlarged lymph nodes.         also see HPI above. I have reviewed all other systems and they were negative.   Physical Exam  General:  overweight-appearing.  alert, well-developed, well-nourished, and cooperative to examination.  female friend at side Head:  Normocephalic and atraumatic without obvious abnormalities. No apparent alopecia or balding. Eyes:  vision grossly intact; pupils equal, round and reactive to light.  conjunctiva and lids normal.    Ears:  normal pinnae bilaterally, without erythema, swelling, or tenderness to palpation. TMs clear, without effusion, or cerumen impaction. Hearing grossly normal bilaterally  Mouth:  teeth and gums in good repair; mucous membranes moist, without lesions or ulcers. oropharynx clear without exudate, no erythema.  Neck:  thick, supple, full ROM, no masses, no thyromegaly; no thyroid nodules or tenderness. no JVD or carotid bruits.   Lungs:  normal respiratory effort, no intercostal retractions or use of accessory muscles; normal breath sounds bilaterally - no crackles and no wheezes.    Heart:  normal rate, regular rhythm, no murmur, and no rub. BLE without edema. normal DP pulses and normal cap refill in all 4 extremities    Msk:  No deformity or scoliosis noted of thoracic or lumbar spine.   Neurologic:  alert & oriented X3  and cranial nerves II-XII symetrically intact.  strength normal in all extremities, sensation intact to light touch, and gait normal. speech fluent without dysarthria or aphasia; follows commands with good comprehension.  Skin:  infiltration changes: warmth/redness - over lateral upper left arm at site of injection - no nodules or abscess - Psych:  Oriented X3, memory intact for recent and remote, normally interactive, good eye contact, not anxious appearing, not depressed appearing, and not agitated.     Impression & Recommendations:  Problem # 1:  CELLULITIS, ARM (ICD-682.3)  tx with emperic doxy - though pt expresses uncertaintly re: keeping meds down - request home IV abx but poor veins for IV - supportive care with ice and heat - no fever or need for IV abx at this time - advised against IM shots Her updated medication list for this problem includes:    Doxycycline Hyclate 100 Mg Tabs (Doxycycline hyclate) .Marland Kitchen... 1 by mouth two times a day x 7 days  Orders: Prescription Created Electronically [phone])  Problem # 2:  CHRONIC PAIN SYNDROME (ICD-338.4) long hx reviewed and [place] narc registry reviewed- i declined to provide pt's narc meds but can make referral as needed - pt to cont to follow with current providers until such time as new clinic can be arranged -  Problem # 3:  MIGRAINE HEADACHE (ICD-346.90) on Topamax for same - to cont to follow with neuro as ongoing The following medications were removed from the medication list:    Dilaudid 2 Mg Tabs (Hydromorphone hcl) .Marland Kitchen... Take 1 by mouth q 8 hours as needed    Vicodin 5-500 Mg Tabs (Hydrocodone-acetaminophen) .Marland Kitchen... Take 1 q 6 hours as needed Her updated medication list for this problem includes:    Duragesic-75 75 Mcg/hr Pt72 (Fentanyl) ..... Use transdermally q 3 days    Hydrocodone-acetaminophen 10-325 Mg Tabs (Hydrocodone-acetaminophen) .Marland Kitchen... Take 1-2 by mouth once daily as needed  Problem # 4:  CARCINOMA, BREAST (  ICD-174.9) i  have no info re: this recent dx -  i have asked pt to have her providers outside of the [facility] include me in these tx going forward, but will not send for records at this time  Problem # 5:  DEPRESSION (ICD-311)  Her updated medication list for this problem includes:    Klonopin 1 Mg Tabs (Clonazepam) .Marland Kitchen... Take 1-2 at bedtime    Trazodone Hcl 150 Mg Tabs (Trazodone hcl) .Marland Kitchen... Take 2 at bedtime    Cymbalta 60 Mg Cpep (Duloxetine hcl) .Marland Kitchen... Take 1 by mouth two times a day reports cymbalta primarily used (ineffectively) for pain, not depression - would consider psyc input if pt agreeable as pain symptoms distressing to pt - but no referral done at this time Time spent with patient 45 minutes, more than 50% of this time was spent counseling patient on pain and reviewing her hosp at [facility] last months - also referral to new pain mgmt ---- note, pt then declined referral to pain ceneter - states she will stay at [facility]  Complete Medication List: 1)  Reglan 10 Mg Tabs (Metoclopramide hcl) .... Take 1 before meals and bedtime prn 2)  Duragesic-75 75 Mcg/hr Pt72 (Fentanyl) .... Use transdermally q 3 days 3)  Neurontin 300 Mg Caps (Gabapentin) .... Take 3 three times a day 4)  Klonopin 1 Mg Tabs (Clonazepam) .... Take 1-2 at bedtime 5)  Prednisone 5 Mg Tabs (Prednisone) .... Take 1 by mouth once daily 6)  Klor-con M20 20 Meq Cr-tabs (Potassium chloride crys cr) .... Take 1 two times a day 7)  Trazodone Hcl 150 Mg Tabs (Trazodone hcl) .... Take 2 at bedtime 8)  Probiotic Caps (Probiotic product) .... Take 1-3 two times a day 9)  Topamax 100 Mg Tabs (Topiramate) .... Take 1 q am and 2 at bedtime 10)  Hydrocodone-acetaminophen 10-325 Mg Tabs (Hydrocodone-acetaminophen) .... Take 1-2 by mouth once daily as needed 11)  Cymbalta 60 Mg Cpep (Duloxetine hcl) .... Take 1 by mouth two times a day 12)  Prilosec 20 Mg Cpdr (Omeprazole) .... Take 1 two times a day 13)  Colace 100 Mg Caps (Docusate sodium) .... Take 2  two times a day 14)  Soma 350 Mg Tabs (Carisoprodol) .... Take 1-4 by mouth once daily as needed 15)  Aldactazide 25-25 Mg Tabs (Spironolactone-hctz) .... Take 1 two times a day 16)  Doxycycline Hyclate 100 Mg Tabs (Doxycycline hyclate) .Marland Kitchen.. 1 by mouth two times a day x 7 days 17)  Promethazine Hcl 25 Mg Supp (Promethazine hcl) .Marland Kitchen.. 1 pr every 4 hours as needed for n/v (if unable to take by mouth) 18)  Promethazine Hcl 25 Mg Tabs (Promethazine hcl) .Marland Kitchen.. 1 by mouth every 4 hours as needed for nausea or vomitting  Patient Instructions: 1)  it was good to see you today. 2)  medical history, recent hosp at [facility] andmedications reviewed today - 3)  we'll make referral to new pain mgmt clinic (besides [facility]) . Our office will contact you regarding this appointment once made. you should continue going to [facility] until this is done because this office will not prescribe ongoing chronic pain medications or refills 4)  doxyclcyline antibioitcs and phenergan as discussed - 5)  use alternating heat and cold packs on your left arm as needed 6)  please have your other providers send me fax copies of their notes at future visits going forward - especially regarding your breast cancer and treatment, GI problems and lupus -  7)  Please schedule a follow-up appointment in 3-4 months to continue review of primary care needs, call sooner if problems.  Prescriptions: PROMETHAZINE HCL 25 MG TABS (PROMETHAZINE HCL) 1 by mouth every 4 hours as needed for nausea or vomitting  #40 x 1   Entered and Authorized by:   Newt Lukes MD   Signed by:   Newt Lukes MD on 09/07/2009   Method used:   Print then Give to Patient   RxID:   [id number] PROMETHAZINE HCL 25 MG SUPP (PROMETHAZINE HCL) 1 PR every 4 hours as needed for n/v (if unable to take by mouth)  #30 x 1   Entered and Authorized by:   Newt Lukes MD   Signed by:   Newt Lukes MD on 09/07/2009   Method used:   Print then Give to  Patient   RxID:   [id number] DOXYCYCLINE HYCLATE 100 MG TABS (DOXYCYCLINE HYCLATE) 1 by mouth two times a day x 7 days  #14 x 0   Entered and Authorized by:   Newt Lukes MD   Signed by:   Newt Lukes MD on 09/07/2009   Method used:   Print then Give to Patient   RxID:   [id number]    Immunization History:  Tetanus/Td Immunization History:    Tetanus/Td:  historical (02/07/2006)  Pneumovax Immunization History:    Pneumovax:  historical (02/08/2008)    Mammogram  Procedure date:  08/25/2009  Findings:      No specific mammographic evidence of malignancy.

## 2010-03-09 NOTE — Letter (Signed)
[facility]   Imported By: Sherian Rein 09/17/2009 11:45:00  _____________________________________________________________________  External Attachment:    Type:   Image     Comment:   External Document

## 2010-03-09 NOTE — Progress Notes (Signed)
Summary: Rx refill req  Phone Note Call from Patient Call back at Home Phone [phone]   Caller: Patient Summary of Call: Pt called stating that she needed refills of HCTZ per Cancer center before [facility] a Cath is placed tomorrow. I called pt back to verify Rx and was informed that medication was removed in eror, she states that she is still taking medication but had ran out. Pt requested that we list medication and send refills to [facility]. Initial call taken by: Margaret Pyle, CMA,  September 17, 2009 3:38 PM    New/Updated Medications: HYDROCHLOROTHIAZIDE 25 MG TABS (HYDROCHLOROTHIAZIDE) 1 by mouth once daily Prescriptions: HYDROCHLOROTHIAZIDE 25 MG TABS (HYDROCHLOROTHIAZIDE) 1 by mouth once daily  #30 x 11   Entered by:   Margaret Pyle, CMA   Authorized by:   Newt Lukes MD   Signed by:   Margaret Pyle, CMA on 09/17/2009   Method used:   Electronically to        [facility] [phone]* (retail)       [address]       [place], [place]  [address]       Ph: [phone]       Fax: [phone]   RxID:   [id number]

## 2010-03-09 NOTE — Progress Notes (Signed)
Summary: pharmacy change  Phone Note Refill Request Message from:  Patient on December 14, 2009 3:52 PM  Refills Requested: Medication #1:  TRAZODONE HCL 150 MG TABS take 2 at bedtime  Medication #2:  PREDNISONE 5 MG TABS take 1 by mouth once daily Pt states original Rx was sent ot the wrong pharmacy, Rx re-sent.   Method Requested: Electronic Initial call taken by: Margaret Pyle, CMA,  December 14, 2009 3:55 PM    Prescriptions: TRAZODONE HCL 150 MG TABS (TRAZODONE HCL) take 2 at bedtime  #60 x 5   Entered by:   Margaret Pyle, CMA   Authorized by:   Newt Lukes MD   Signed by:   Margaret Pyle, CMA on 12/14/2009   Method used:   Electronically to        [facility] # [phone]* (retail)       [address] [place], [place]  [address]       Ph: [phone]       Fax: [phone]   RxID:   [id number] PREDNISONE 5 MG TABS (PREDNISONE) take 1 by mouth once daily  #30 x 5   Entered by:   Margaret Pyle, CMA   Authorized by:   Newt Lukes MD   Signed by:   Margaret Pyle, CMA on 12/14/2009   Method used:   Electronically to        [facility] # [phone]* (retail)       [address] [place], [place]  [address]       Ph: [phone]       Fax: [phone]   RxID:   [id number]

## 2010-03-09 NOTE — Letter (Signed)
Summary: [facility]/Oncology  [facility]/Oncology   Imported By: Lester [place] 12/10/2009 10:44:13  _____________________________________________________________________  External Attachment:    Type:   Image     Comment:   External Document

## 2010-03-09 NOTE — Letter (Signed)
Summary: [name]   Imported By: Lennie Odor 09/09/2009 11:17:37  _____________________________________________________________________  External Attachment:    Type:   Image     Comment:   External Document

## 2010-03-09 NOTE — Medication Information (Signed)
Summary: Diabetic Supplies / [facility]  Diabetic Supplies / [facility]   Imported By: Lennie Odor 01/06/2010 15:11:22  _____________________________________________________________________  External Attachment:    Type:   Image     Comment:   External Document

## 2010-03-09 NOTE — Letter (Signed)
Summary: Diabetes Testing Supplies / [facility]  Diabetes Testing Supplies / [facility]   Imported By: Lennie Odor 10/16/2009 15:43:52  _____________________________________________________________________  External Attachment:    Type:   Image     Comment:   External Document

## 2010-03-09 NOTE — Progress Notes (Signed)
Summary: pt declines pain referral  ---- Converted from flag ---- ---- 09/07/2009 2:15 PM, Orlan Leavens RMA wrote: Per pt she states she will stay with Dr. Tommy Rainwater (pain management). No need for new referral ------------------------------  ok, noted - thanks

## 2010-03-11 NOTE — Procedures (Signed)
Summary: [facility]  [facility]   Imported By: Sherian Rein 01/21/2010 14:25:46  _____________________________________________________________________  External Attachment:    Type:   Image     Comment:   External Document

## 2010-03-13 ENCOUNTER — Emergency Department (HOSPITAL_BASED_OUTPATIENT_CLINIC_OR_DEPARTMENT_OTHER)
Admission: EM | Admit: 2010-03-13 | Discharge: 2010-03-13 | Disposition: A | Discharge status: Home / Self Care | Payer: Medicare Other | Attending: Emergency Medicine | Admitting: Emergency Medicine

## 2010-03-13 DIAGNOSIS — Z853 Personal history of malignant neoplasm of breast: Secondary | ICD-10-CM | POA: Insufficient documentation

## 2010-03-13 DIAGNOSIS — R51 Headache: Secondary | ICD-10-CM | POA: Insufficient documentation

## 2010-03-13 DIAGNOSIS — E119 Type 2 diabetes mellitus without complications: Secondary | ICD-10-CM | POA: Insufficient documentation

## 2010-03-13 DIAGNOSIS — Z79899 Other long term (current) drug therapy: Secondary | ICD-10-CM | POA: Insufficient documentation

## 2010-03-13 DIAGNOSIS — G8929 Other chronic pain: Secondary | ICD-10-CM | POA: Insufficient documentation

## 2010-04-08 HISTORY — PX: PORT-A-CATH REMOVAL: SHX5289

## 2010-04-19 LAB — BASIC METABOLIC PANEL
BUN: 12 mg/dL (ref 6–23)
CO2: 29 mEq/L (ref 19–32)
Chloride: 100 mEq/L (ref 96–112)
Chloride: 98 mEq/L (ref 96–112)
GFR calc Af Amer: >60 mL/min (ref >60)
GFR calc non Af Amer: 58 mL/min — ABNORMAL LOW (ref >60)
Glucose, Bld: 110 mg/dL — ABNORMAL HIGH (ref 70–99)
Glucose, Bld: 123 mg/dL — ABNORMAL HIGH (ref 70–99)
Potassium: 3.6 mEq/L (ref 3.5–5.1)
Sodium: 139 mEq/L (ref 135–145)

## 2010-04-19 LAB — DIFFERENTIAL
Basophils Absolute: 0 K/uL (ref 0.0–0.1)
Basophils Relative: 0 % (ref 0–1)
Eosinophils Absolute: 0.2 K/uL (ref 0.0–0.7)
Eosinophils Relative: 2 % (ref 0–5)
Eosinophils Relative: 2 % (ref 0–5)
Lymphocytes Relative: 17 % (ref 12–46)
Lymphocytes Relative: 17 % (ref 12–46)
Lymphs Abs: 1.2 K/uL (ref 0.7–4.0)
Monocytes Absolute: 0.3 10*3/uL (ref 0.1–1.0)
Monocytes Absolute: 0.9 10*3/uL (ref 0.1–1.0)
Monocytes Relative: 12 % (ref 3–12)
Monocytes Relative: 5 % (ref 3–12)
Neutro Abs: 5.2 10*3/uL (ref 1.7–7.7)
Neutro Abs: 5.4 10*3/uL (ref 1.7–7.7)
Neutrophils Relative %: 76 % (ref 43–77)

## 2010-04-19 LAB — CBC
HCT: 37 % (ref 36.0–46.0)
HCT: 40.6 % (ref 36.0–46.0)
Hemoglobin: 11.8 g/dL — ABNORMAL LOW (ref 12.0–15.0)
Hemoglobin: 13.1 g/dL (ref 12.0–15.0)
MCH: 28.8 pg (ref 26.0–34.0)
MCHC: 31.9 g/dL (ref 30.0–36.0)
MCHC: 32.3 g/dL (ref 30.0–36.0)
MCV: 89.2 fL (ref 78.0–100.0)
MCV: 89.4 fL (ref 78.0–100.0)
Platelets: 250 K/uL (ref 150–400)
RBC: 4.55 MIL/uL (ref 3.87–5.11)
RDW: 15.7 % — ABNORMAL HIGH (ref 11.5–15.5)
WBC: 6.9 10*3/uL (ref 4.0–10.5)
WBC: 7.9 10*3/uL (ref 4.0–10.5)

## 2010-04-19 LAB — BASIC METABOLIC PANEL WITH GFR
BUN: 14 mg/dL (ref 6–23)
Calcium: 8.9 mg/dL (ref 8.4–10.5)
Creatinine, Ser: 0.9 mg/dL (ref 0.4–1.2)
GFR calc non Af Amer: >60 mL/min (ref >60)
Potassium: 3.7 meq/L (ref 3.5–5.1)
Sodium: 138 meq/L (ref 135–145)

## 2010-04-20 LAB — BASIC METABOLIC PANEL
BUN: 12 mg/dL (ref 6–23)
BUN: 14 mg/dL (ref 6–23)
BUN: 18 mg/dL (ref 6–23)
CO2: 30 mEq/L (ref 19–32)
Calcium: 8.7 mg/dL (ref 8.4–10.5)
Chloride: 101 mEq/L (ref 96–112)
Chloride: 101 mEq/L (ref 96–112)
Chloride: 102 mEq/L (ref 96–112)
Creatinine, Ser: 0.9 mg/dL (ref 0.4–1.2)
Creatinine, Ser: 0.9 mg/dL (ref 0.4–1.2)
Creatinine, Ser: 0.9 mg/dL (ref 0.4–1.2)
Creatinine, Ser: 0.9 mg/dL (ref 0.4–1.2)
GFR calc Af Amer: >60 mL/min (ref >60)
GFR calc non Af Amer: >60 mL/min (ref >60)
Glucose, Bld: 128 mg/dL — ABNORMAL HIGH (ref 70–99)
Glucose, Bld: 90 mg/dL (ref 70–99)
Potassium: 3.6 mEq/L (ref 3.5–5.1)

## 2010-04-20 LAB — CBC
MCH: 29.8 pg (ref 26.0–34.0)
MCH: 30.2 pg (ref 26.0–34.0)
MCHC: 33.8 g/dL (ref 30.0–36.0)
MCV: 88.2 fL (ref 78.0–100.0)
MCV: 89.1 fL (ref 78.0–100.0)
Platelets: 127 10*3/uL — ABNORMAL LOW (ref 150–400)
Platelets: 156 10*3/uL (ref 150–400)
Platelets: 238 10*3/uL (ref 150–400)
RBC: 3.27 MIL/uL — ABNORMAL LOW (ref 3.87–5.11)
RBC: 3.3 MIL/uL — ABNORMAL LOW (ref 3.87–5.11)
RDW: 21.1 % — ABNORMAL HIGH (ref 11.5–15.5)
RDW: 22.4 % — ABNORMAL HIGH (ref 11.5–15.5)
WBC: 3.6 10*3/uL — ABNORMAL LOW (ref 4.0–10.5)
WBC: 7.2 10*3/uL (ref 4.0–10.5)

## 2010-04-20 LAB — DIFFERENTIAL
Basophils Absolute: 0 10*3/uL (ref 0.0–0.1)
Eosinophils Absolute: 0.1 10*3/uL (ref 0.0–0.7)
Eosinophils Relative: 1 % (ref 0–5)
Lymphocytes Relative: 5 % — ABNORMAL LOW (ref 12–46)
Lymphs Abs: 0.3 10*3/uL — ABNORMAL LOW (ref 0.7–4.0)
Lymphs Abs: 0.7 10*3/uL (ref 0.7–4.0)
Lymphs Abs: 1 10*3/uL (ref 0.7–4.0)
Monocytes Absolute: 0.2 10*3/uL (ref 0.1–1.0)
Monocytes Absolute: 0.3 10*3/uL (ref 0.1–1.0)
Monocytes Relative: 5 % (ref 3–12)
Neutrophils Relative %: 95 % — ABNORMAL HIGH (ref 43–77)

## 2010-04-22 LAB — URINE CULTURE: Culture  Setup Time: 201110091205

## 2010-04-22 LAB — CULTURE, BLOOD (ROUTINE X 2)
Culture  Setup Time: 201110090106
Culture: NO GROWTH

## 2010-04-22 LAB — URINALYSIS, ROUTINE W REFLEX MICROSCOPIC
Bilirubin Urine: NEGATIVE
Glucose, UA: NEGATIVE mg/dL
Hgb urine dipstick: NEGATIVE
Ketones, ur: NEGATIVE mg/dL
Nitrite: NEGATIVE

## 2010-04-22 LAB — COMPREHENSIVE METABOLIC PANEL
ALT: 32 U/L (ref 0–35)
Albumin: 3.8 g/dL (ref 3.5–5.2)
Alkaline Phosphatase: 93 U/L (ref 39–117)
BUN: 13 mg/dL (ref 6–23)
Chloride: 101 mEq/L (ref 96–112)
Potassium: 3.3 mEq/L — ABNORMAL LOW (ref 3.5–5.1)
Sodium: 137 mEq/L (ref 135–145)
Total Bilirubin: 0.6 mg/dL (ref 0.3–1.2)

## 2010-04-22 LAB — CBC
HCT: 38.7 % (ref 36.0–46.0)
MCV: 84.6 fL (ref 78.0–100.0)
Platelets: 275 10*3/uL (ref 150–400)
RBC: 4.57 MIL/uL (ref 3.87–5.11)
WBC: 14.8 10*3/uL — ABNORMAL HIGH (ref 4.0–10.5)

## 2010-04-22 LAB — DIFFERENTIAL
Basophils Relative: 3 % — ABNORMAL HIGH (ref 0–1)
Eosinophils Absolute: 0 10*3/uL (ref 0.0–0.7)
Lymphs Abs: 1.6 10*3/uL (ref 0.7–4.0)
Monocytes Absolute: 0.9 10*3/uL (ref 0.1–1.0)
Neutro Abs: 11.9 10*3/uL — ABNORMAL HIGH (ref 1.7–7.7)
Neutrophils Relative %: 80 % — ABNORMAL HIGH (ref 43–77)

## 2010-04-23 LAB — DIFFERENTIAL
Basophils Absolute: 0.2 10*3/uL — ABNORMAL HIGH (ref 0.0–0.1)
Basophils Relative: 3 % — ABNORMAL HIGH (ref 0–1)
Eosinophils Absolute: 0.1 10*3/uL (ref 0.0–0.7)
Eosinophils Relative: 2 % (ref 0–5)
Lymphocytes Relative: 18 % (ref 12–46)
Monocytes Absolute: 0.7 10*3/uL (ref 0.1–1.0)

## 2010-04-23 LAB — CBC
HCT: 40.6 % (ref 36.0–46.0)
MCH: 26 pg (ref 26.0–34.0)
MCHC: 32.2 g/dL (ref 30.0–36.0)
MCV: 80.9 fL (ref 78.0–100.0)
RDW: 20.6 % — ABNORMAL HIGH (ref 11.5–15.5)
WBC: 8.6 10*3/uL (ref 4.0–10.5)

## 2010-04-23 LAB — COMPREHENSIVE METABOLIC PANEL
Alkaline Phosphatase: 96 U/L (ref 39–117)
BUN: 12 mg/dL (ref 6–23)
Glucose, Bld: 82 mg/dL (ref 70–99)
Potassium: 4 mEq/L (ref 3.5–5.1)
Total Bilirubin: 0.5 mg/dL (ref 0.3–1.2)
Total Protein: 7.6 g/dL (ref 6.0–8.3)

## 2010-04-24 LAB — DIFFERENTIAL
Basophils Absolute: 0.2 10*3/uL — ABNORMAL HIGH (ref 0.0–0.1)
Basophils Relative: 2 % — ABNORMAL HIGH (ref 0–1)
Lymphocytes Relative: 15 % (ref 12–46)
Lymphocytes Relative: 19 % (ref 12–46)
Lymphs Abs: 1.4 10*3/uL (ref 0.7–4.0)
Monocytes Relative: 8 % (ref 3–12)
Neutro Abs: 6 10*3/uL (ref 1.7–7.7)
Neutrophils Relative %: 74 % (ref 43–77)

## 2010-04-24 LAB — CBC
HCT: 30.3 % — ABNORMAL LOW (ref 36.0–46.0)
Hemoglobin: 8.9 g/dL — ABNORMAL LOW (ref 12.0–15.0)
MCH: 23.5 pg — ABNORMAL LOW (ref 26.0–34.0)
MCHC: 31 g/dL (ref 30.0–36.0)
MCV: 75.3 fL — ABNORMAL LOW (ref 78.0–100.0)
RBC: 4.02 MIL/uL (ref 3.87–5.11)
WBC: 8.7 10*3/uL (ref 4.0–10.5)

## 2010-04-24 LAB — BASIC METABOLIC PANEL
Chloride: 99 mEq/L (ref 96–112)
GFR calc Af Amer: >60 mL/min (ref >60)
Potassium: 3.4 mEq/L — ABNORMAL LOW (ref 3.5–5.1)
Sodium: 139 mEq/L (ref 135–145)

## 2010-04-24 LAB — COMPREHENSIVE METABOLIC PANEL
AST: 16 U/L (ref 0–37)
CO2: 30 mEq/L (ref 19–32)
Calcium: 8.7 mg/dL (ref 8.4–10.5)
Creatinine, Ser: 1.2 mg/dL (ref 0.4–1.2)
GFR calc Af Amer: 57 mL/min — ABNORMAL LOW (ref >60)
GFR calc non Af Amer: 47 mL/min — ABNORMAL LOW (ref >60)
Glucose, Bld: 105 mg/dL — ABNORMAL HIGH (ref 70–99)

## 2010-04-24 LAB — POCT CARDIAC MARKERS
CKMB, poc: 1.2 ng/mL (ref 1.0–8.0)
Myoglobin, poc: 101 ng/mL (ref 12–200)

## 2010-04-24 LAB — LIPASE, BLOOD: Lipase: 121 U/L (ref 23–300)

## 2010-04-25 LAB — LIPID PANEL
Cholesterol: 272 mg/dL — ABNORMAL HIGH (ref 0–200)
HDL: 88 mg/dL (ref >39)
LDL Cholesterol: 162 mg/dL — ABNORMAL HIGH (ref 0–99)
Total CHOL/HDL Ratio: 3.1 RATIO
Triglycerides: 109 mg/dL (ref <150)

## 2010-04-25 LAB — MAGNESIUM
Magnesium: 2.1 mg/dL (ref 1.5–2.5)
Magnesium: 2.1 mg/dL (ref 1.5–2.5)
Magnesium: 2.1 mg/dL (ref 1.5–2.5)
Magnesium: 2.2 mg/dL (ref 1.5–2.5)

## 2010-04-25 LAB — GLUCOSE, CAPILLARY
Glucose-Capillary: 101 mg/dL — ABNORMAL HIGH (ref 70–99)
Glucose-Capillary: 107 mg/dL — ABNORMAL HIGH (ref 70–99)
Glucose-Capillary: 111 mg/dL — ABNORMAL HIGH (ref 70–99)
Glucose-Capillary: 113 mg/dL — ABNORMAL HIGH (ref 70–99)
Glucose-Capillary: 114 mg/dL — ABNORMAL HIGH (ref 70–99)
Glucose-Capillary: 117 mg/dL — ABNORMAL HIGH (ref 70–99)
Glucose-Capillary: 129 mg/dL — ABNORMAL HIGH (ref 70–99)
Glucose-Capillary: 133 mg/dL — ABNORMAL HIGH (ref 70–99)
Glucose-Capillary: 133 mg/dL — ABNORMAL HIGH (ref 70–99)
Glucose-Capillary: 137 mg/dL — ABNORMAL HIGH (ref 70–99)
Glucose-Capillary: 103 mg/dL — ABNORMAL HIGH (ref 70–99)
Glucose-Capillary: 114 mg/dL — ABNORMAL HIGH (ref 70–99)
Glucose-Capillary: 121 mg/dL — ABNORMAL HIGH (ref 70–99)
Glucose-Capillary: 136 mg/dL — ABNORMAL HIGH (ref 70–99)
Glucose-Capillary: 94 mg/dL (ref 70–99)

## 2010-04-25 LAB — COMPREHENSIVE METABOLIC PANEL
ALT: 16 U/L (ref 0–35)
ALT: 18 U/L (ref 0–35)
ALT: 20 U/L (ref 0–35)
ALT: 10 U/L (ref 0–35)
AST: 16 U/L (ref 0–37)
AST: 22 U/L (ref 0–37)
AST: 17 U/L (ref 0–37)
Albumin: 3.2 g/dL — ABNORMAL LOW (ref 3.5–5.2)
Albumin: 3.8 g/dL (ref 3.5–5.2)
Alkaline Phosphatase: 69 U/L (ref 39–117)
Alkaline Phosphatase: 63 U/L (ref 39–117)
BUN: 14 mg/dL (ref 6–23)
CO2: 26 mEq/L (ref 19–32)
CO2: 26 mEq/L (ref 19–32)
CO2: 33 mEq/L — ABNORMAL HIGH (ref 19–32)
Calcium: 8.4 mg/dL (ref 8.4–10.5)
Calcium: 8.7 mg/dL (ref 8.4–10.5)
Chloride: 99 mEq/L (ref 96–112)
Creatinine, Ser: 1.02 mg/dL (ref 0.4–1.2)
GFR calc Af Amer: 55 mL/min — ABNORMAL LOW (ref >60)
GFR calc Af Amer: >60 mL/min (ref >60)
GFR calc Af Amer: >60 mL/min (ref >60)
GFR calc Af Amer: >60 mL/min (ref >60)
GFR calc non Af Amer: 56 mL/min — ABNORMAL LOW (ref >60)
GFR calc non Af Amer: >60 mL/min (ref >60)
GFR calc non Af Amer: >60 mL/min (ref >60)
Glucose, Bld: 128 mg/dL — ABNORMAL HIGH (ref 70–99)
Glucose, Bld: 98 mg/dL (ref 70–99)
Potassium: 4.1 mEq/L (ref 3.5–5.1)
Potassium: 3.6 mEq/L (ref 3.5–5.1)
Sodium: 137 mEq/L (ref 135–145)
Sodium: 139 mEq/L (ref 135–145)
Sodium: 140 mEq/L (ref 135–145)
Sodium: 140 mEq/L (ref 135–145)
Total Bilirubin: 0.3 mg/dL (ref 0.3–1.2)
Total Protein: 6.7 g/dL (ref 6.0–8.3)

## 2010-04-25 LAB — TSH: TSH: 0.763 u[IU]/mL (ref 0.350–4.500)

## 2010-04-25 LAB — CBC
HCT: 28.9 % — ABNORMAL LOW (ref 36.0–46.0)
HCT: 29.6 % — ABNORMAL LOW (ref 36.0–46.0)
Hemoglobin: 10 g/dL — ABNORMAL LOW (ref 12.0–15.0)
Hemoglobin: 9.2 g/dL — ABNORMAL LOW (ref 12.0–15.0)
Hemoglobin: 9.3 g/dL — ABNORMAL LOW (ref 12.0–15.0)
Hemoglobin: 9.4 g/dL — ABNORMAL LOW (ref 12.0–15.0)
MCH: 24.1 pg — ABNORMAL LOW (ref 26.0–34.0)
MCHC: 31.2 g/dL (ref 30.0–36.0)
MCHC: 32 g/dL (ref 30.0–36.0)
MCHC: 32.1 g/dL (ref 30.0–36.0)
MCV: 76 fL — ABNORMAL LOW (ref 78.0–100.0)
MCV: 75.3 fL — ABNORMAL LOW (ref 78.0–100.0)
Platelets: 266 10*3/uL (ref 150–400)
Platelets: 185 10*3/uL (ref 150–400)
Platelets: 280 10*3/uL (ref 150–400)
RBC: 3.88 MIL/uL (ref 3.87–5.11)
RBC: 3.93 MIL/uL (ref 3.87–5.11)
RBC: 4.25 MIL/uL (ref 3.87–5.11)
RDW: 17.2 % — ABNORMAL HIGH (ref 11.5–15.5)
RDW: 18.1 % — ABNORMAL HIGH (ref 11.5–15.5)
WBC: 7 10*3/uL (ref 4.0–10.5)
WBC: 8.1 10*3/uL (ref 4.0–10.5)
WBC: 12.2 10*3/uL — ABNORMAL HIGH (ref 4.0–10.5)

## 2010-04-25 LAB — BASIC METABOLIC PANEL
BUN: 14 mg/dL (ref 6–23)
BUN: 9 mg/dL (ref 6–23)
BUN: 24 mg/dL — ABNORMAL HIGH (ref 6–23)
CO2: 29 mEq/L (ref 19–32)
CO2: 32 mEq/L (ref 19–32)
Calcium: 9.1 mg/dL (ref 8.4–10.5)
Calcium: 8.4 mg/dL (ref 8.4–10.5)
Calcium: 8.4 mg/dL (ref 8.4–10.5)
Chloride: 104 mEq/L (ref 96–112)
Chloride: 98 mEq/L (ref 96–112)
Chloride: 98 mEq/L (ref 96–112)
Creatinine, Ser: 1.1 mg/dL (ref 0.4–1.2)
GFR calc Af Amer: >60 mL/min (ref >60)
GFR calc Af Amer: >60 mL/min (ref >60)
GFR calc Af Amer: >60 mL/min (ref >60)
GFR calc non Af Amer: 52 mL/min — ABNORMAL LOW (ref >60)
GFR calc non Af Amer: 47 mL/min — ABNORMAL LOW (ref >60)
GFR calc non Af Amer: 49 mL/min — ABNORMAL LOW (ref >60)
Glucose, Bld: 146 mg/dL — ABNORMAL HIGH (ref 70–99)
Glucose, Bld: 103 mg/dL — ABNORMAL HIGH (ref 70–99)
Glucose, Bld: 87 mg/dL (ref 70–99)
Glucose, Bld: 115 mg/dL — ABNORMAL HIGH (ref 70–99)
Potassium: 2.7 mEq/L — CL (ref 3.5–5.1)
Potassium: 3 mEq/L — ABNORMAL LOW (ref 3.5–5.1)
Potassium: 3.4 mEq/L — ABNORMAL LOW (ref 3.5–5.1)
Potassium: 3.6 mEq/L (ref 3.5–5.1)
Potassium: 3 mEq/L — ABNORMAL LOW (ref 3.5–5.1)
Sodium: 140 mEq/L (ref 135–145)
Sodium: 136 mEq/L (ref 135–145)
Sodium: 136 mEq/L (ref 135–145)
Sodium: 138 mEq/L (ref 135–145)
Sodium: 140 mEq/L (ref 135–145)

## 2010-04-25 LAB — DIFFERENTIAL
Basophils Absolute: 0.2 10*3/uL — ABNORMAL HIGH (ref 0.0–0.1)
Basophils Absolute: 0.1 10*3/uL (ref 0.0–0.1)
Basophils Absolute: 0.3 10*3/uL — ABNORMAL HIGH (ref 0.0–0.1)
Basophils Relative: 2 % — ABNORMAL HIGH (ref 0–1)
Basophils Relative: 0 % (ref 0–1)
Basophils Relative: 1 % (ref 0–1)
Basophils Relative: 3 % — ABNORMAL HIGH (ref 0–1)
Eosinophils Absolute: 0.1 10*3/uL (ref 0.0–0.7)
Eosinophils Absolute: 0.1 10*3/uL (ref 0.0–0.7)
Eosinophils Absolute: 0.1 10*3/uL (ref 0.0–0.7)
Eosinophils Absolute: 0.1 10*3/uL (ref 0.0–0.7)
Eosinophils Relative: 1 % (ref 0–5)
Eosinophils Relative: 2 % (ref 0–5)
Eosinophils Relative: 1 % (ref 0–5)
Lymphocytes Relative: 21 % (ref 12–46)
Lymphocytes Relative: 17 % (ref 12–46)
Lymphocytes Relative: 16 % (ref 12–46)
Lymphs Abs: 1.4 10*3/uL (ref 0.7–4.0)
Lymphs Abs: 1.7 10*3/uL (ref 0.7–4.0)
Lymphs Abs: 2 10*3/uL (ref 0.7–4.0)
Monocytes Absolute: 0.8 10*3/uL (ref 0.1–1.0)
Monocytes Absolute: 0.6 10*3/uL (ref 0.1–1.0)
Monocytes Absolute: 1 10*3/uL (ref 0.1–1.0)
Monocytes Relative: 6 % (ref 3–12)
Monocytes Relative: 7 % (ref 3–12)
Monocytes Relative: 8 % (ref 3–12)
Monocytes Relative: 8 % (ref 3–12)
Neutro Abs: 7.3 10*3/uL (ref 1.7–7.7)
Neutro Abs: 4.9 10*3/uL (ref 1.7–7.7)
Neutro Abs: 8.8 10*3/uL — ABNORMAL HIGH (ref 1.7–7.7)
Neutrophils Relative %: 75 % (ref 43–77)
Neutrophils Relative %: 71 % (ref 43–77)
Neutrophils Relative %: 73 % (ref 43–77)

## 2010-04-25 LAB — HEMOGLOBIN A1C
Hgb A1c MFr Bld: 6.7 % — ABNORMAL HIGH (ref <5.7)
Mean Plasma Glucose: 146 mg/dL — ABNORMAL HIGH (ref <117)

## 2010-04-25 LAB — RAPID URINE DRUG SCREEN, HOSP PERFORMED
Benzodiazepines: POSITIVE — AB
Cocaine: NOT DETECTED
Opiates: POSITIVE — AB
Tetrahydrocannabinol: NOT DETECTED

## 2010-04-25 LAB — CARDIAC PANEL(CRET KIN+CKTOT+MB+TROPI)
CK, MB: 1.6 ng/mL (ref 0.3–4.0)
CK, MB: 2 ng/mL (ref 0.3–4.0)
Total CK: 50 U/L (ref 7–177)
Total CK: 50 U/L (ref 7–177)

## 2010-04-25 LAB — POCT CARDIAC MARKERS
CKMB, poc: 1 ng/mL (ref 1.0–8.0)
Myoglobin, poc: 76.1 ng/mL (ref 12–200)

## 2010-04-25 LAB — T4: T4, Total: 4.9 ug/dL — ABNORMAL LOW (ref 5.0–12.5)

## 2010-04-25 LAB — T3: T3, Total: 66.1 ng/dl — ABNORMAL LOW (ref 80.0–204.0)

## 2010-04-25 LAB — LACTIC ACID, PLASMA: Lactic Acid, Venous: 2.1 mmol/L (ref 0.5–2.2)

## 2010-04-26 LAB — CBC
HCT: 30.3 % — ABNORMAL LOW (ref 36.0–46.0)
Hemoglobin: 9.8 g/dL — ABNORMAL LOW (ref 12.0–15.0)
RBC: 3.97 MIL/uL (ref 3.87–5.11)

## 2010-04-26 LAB — BASIC METABOLIC PANEL
CO2: 28 mEq/L (ref 19–32)
Calcium: 8.4 mg/dL (ref 8.4–10.5)
GFR calc Af Amer: >60 mL/min (ref >60)
GFR calc non Af Amer: >60 mL/min (ref >60)
Potassium: 3.7 mEq/L (ref 3.5–5.1)
Sodium: 138 mEq/L (ref 135–145)

## 2010-04-26 LAB — DIFFERENTIAL
Lymphocytes Relative: 16 % (ref 12–46)
Monocytes Absolute: 0.8 10*3/uL (ref 0.1–1.0)
Monocytes Relative: 7 % (ref 3–12)
Neutro Abs: 8 10*3/uL — ABNORMAL HIGH (ref 1.7–7.7)

## 2010-04-28 LAB — BASIC METABOLIC PANEL
CO2: 28 mEq/L (ref 19–32)
Chloride: 101 mEq/L (ref 96–112)
Glucose, Bld: 94 mg/dL (ref 70–99)
Potassium: 4 mEq/L (ref 3.5–5.1)
Sodium: 140 mEq/L (ref 135–145)

## 2010-04-28 LAB — CBC
HCT: 32.2 % — ABNORMAL LOW (ref 36.0–46.0)
Hemoglobin: 10.2 g/dL — ABNORMAL LOW (ref 12.0–15.0)
MCHC: 31.6 g/dL (ref 30.0–36.0)
MCV: 78 fL (ref 78.0–100.0)
RDW: 15.8 % — ABNORMAL HIGH (ref 11.5–15.5)

## 2010-04-28 LAB — DIFFERENTIAL
Basophils Absolute: 0.1 10*3/uL (ref 0.0–0.1)
Basophils Relative: 2 % — ABNORMAL HIGH (ref 0–1)
Eosinophils Absolute: 0 10*3/uL (ref 0.0–0.7)
Eosinophils Relative: 0 % (ref 0–5)
Lymphocytes Relative: 21 % (ref 12–46)
Monocytes Absolute: 0.5 10*3/uL (ref 0.1–1.0)

## 2010-04-28 LAB — POCT CARDIAC MARKERS: Troponin i, poc: <0.05 ng/mL (ref 0.00–0.09)

## 2010-04-28 LAB — GLUCOSE, CAPILLARY
Glucose-Capillary: 112 mg/dL — ABNORMAL HIGH (ref 70–99)
Glucose-Capillary: 129 mg/dL — ABNORMAL HIGH (ref 70–99)
Glucose-Capillary: 129 mg/dL — ABNORMAL HIGH (ref 70–99)
Glucose-Capillary: 149 mg/dL — ABNORMAL HIGH (ref 70–99)
Glucose-Capillary: 119 mg/dL — ABNORMAL HIGH (ref 70–99)
Glucose-Capillary: 143 mg/dL — ABNORMAL HIGH (ref 70–99)
Glucose-Capillary: 152 mg/dL — ABNORMAL HIGH (ref 70–99)
Glucose-Capillary: 180 mg/dL — ABNORMAL HIGH (ref 70–99)

## 2010-04-28 LAB — CARDIAC PANEL(CRET KIN+CKTOT+MB+TROPI): Relative Index: INVALID (ref 0.0–2.5)

## 2010-04-28 LAB — WOUND CULTURE: Gram Stain: NONE SEEN

## 2010-04-28 LAB — ANA: Anti Nuclear Antibody(ANA): NEGATIVE

## 2010-05-02 LAB — BASIC METABOLIC PANEL
CO2: 31 mEq/L (ref 19–32)
Chloride: 98 mEq/L (ref 96–112)
GFR calc Af Amer: >60 mL/min (ref >60)
Sodium: 138 mEq/L (ref 135–145)

## 2010-05-02 LAB — DIFFERENTIAL
Basophils Relative: 2 % — ABNORMAL HIGH (ref 0–1)
Eosinophils Absolute: 0.1 10*3/uL (ref 0.0–0.7)
Lymphs Abs: 1.9 10*3/uL (ref 0.7–4.0)
Monocytes Absolute: 0.7 10*3/uL (ref 0.1–1.0)
Monocytes Relative: 7 % (ref 3–12)
Neutro Abs: 6.7 10*3/uL (ref 1.7–7.7)

## 2010-05-02 LAB — CBC
Hemoglobin: 10.2 g/dL — ABNORMAL LOW (ref 12.0–15.0)
MCHC: 32.4 g/dL (ref 30.0–36.0)
MCV: 78.6 fL (ref 78.0–100.0)
RBC: 4 MIL/uL (ref 3.87–5.11)
WBC: 9.6 10*3/uL (ref 4.0–10.5)

## 2010-05-03 LAB — COMPREHENSIVE METABOLIC PANEL
AST: 23 U/L (ref 0–37)
BUN: 18 mg/dL (ref 6–23)
CO2: 29 mEq/L (ref 19–32)
Calcium: 9.2 mg/dL (ref 8.4–10.5)
Chloride: 97 mEq/L (ref 96–112)
Creatinine, Ser: 1 mg/dL (ref 0.4–1.2)
GFR calc Af Amer: >60 mL/min (ref >60)
GFR calc non Af Amer: 58 mL/min — ABNORMAL LOW (ref >60)
Total Bilirubin: 0.7 mg/dL (ref 0.3–1.2)

## 2010-05-03 LAB — CBC
HCT: 29 % — ABNORMAL LOW (ref 36.0–46.0)
HCT: 35.3 % — ABNORMAL LOW (ref 36.0–46.0)
Hemoglobin: 9.1 g/dL — ABNORMAL LOW (ref 12.0–15.0)
Hemoglobin: 9.4 g/dL — ABNORMAL LOW (ref 12.0–15.0)
MCHC: 31.9 g/dL (ref 30.0–36.0)
MCHC: 32.5 g/dL (ref 30.0–36.0)
MCV: 78.7 fL (ref 78.0–100.0)
RBC: 3.56 MIL/uL — ABNORMAL LOW (ref 3.87–5.11)
RBC: 3.67 MIL/uL — ABNORMAL LOW (ref 3.87–5.11)
RBC: 4.49 MIL/uL (ref 3.87–5.11)
RDW: 16.4 % — ABNORMAL HIGH (ref 11.5–15.5)
WBC: 7.2 10*3/uL (ref 4.0–10.5)

## 2010-05-03 LAB — BASIC METABOLIC PANEL
CO2: 29 mEq/L (ref 19–32)
CO2: 32 mEq/L (ref 19–32)
Calcium: 8.5 mg/dL (ref 8.4–10.5)
Calcium: 8.6 mg/dL (ref 8.4–10.5)
Chloride: 102 mEq/L (ref 96–112)
Chloride: 106 mEq/L (ref 96–112)
Creatinine, Ser: 0.91 mg/dL (ref 0.4–1.2)
GFR calc Af Amer: >60 mL/min (ref >60)
GFR calc Af Amer: >60 mL/min (ref >60)
Glucose, Bld: 103 mg/dL — ABNORMAL HIGH (ref 70–99)
Glucose, Bld: 126 mg/dL — ABNORMAL HIGH (ref 70–99)
Potassium: 3.1 mEq/L — ABNORMAL LOW (ref 3.5–5.1)
Sodium: 139 mEq/L (ref 135–145)

## 2010-05-03 LAB — IRON AND TIBC
Iron: 67 ug/dL (ref 42–135)
Saturation Ratios: 13 % — ABNORMAL LOW (ref 20–55)
TIBC: 512 ug/dL — ABNORMAL HIGH (ref 250–470)
UIBC: 445 ug/dL

## 2010-05-03 LAB — RETICULOCYTES: Retic Ct Pct: 1.5 % (ref 0.4–3.1)

## 2010-05-17 LAB — URINALYSIS, ROUTINE W REFLEX MICROSCOPIC
Bilirubin Urine: NEGATIVE
Glucose, UA: NEGATIVE mg/dL
Ketones, ur: NEGATIVE mg/dL
Nitrite: NEGATIVE
pH: 8 (ref 5.0–8.0)

## 2010-05-17 LAB — COMPREHENSIVE METABOLIC PANEL
ALT: 27 U/L (ref 0–35)
AST: 25 U/L (ref 0–37)
CO2: 28 mEq/L (ref 19–32)
Calcium: 8.7 mg/dL (ref 8.4–10.5)
Chloride: 103 mEq/L (ref 96–112)
GFR calc Af Amer: >60 mL/min (ref >60)
GFR calc non Af Amer: >60 mL/min (ref >60)
Potassium: 3.8 mEq/L (ref 3.5–5.1)
Sodium: 137 mEq/L (ref 135–145)

## 2010-05-17 LAB — DIFFERENTIAL
Eosinophils Absolute: 0.1 10*3/uL (ref 0.0–0.7)
Eosinophils Relative: 1 % (ref 0–5)
Lymphs Abs: 2 10*3/uL (ref 0.7–4.0)

## 2010-05-17 LAB — CBC
HCT: 32.6 % — ABNORMAL LOW (ref 36.0–46.0)
Hemoglobin: 10.9 g/dL — ABNORMAL LOW (ref 12.0–15.0)
MCV: 88.3 fL (ref 78.0–100.0)
RBC: 3.69 MIL/uL — ABNORMAL LOW (ref 3.87–5.11)
WBC: 6.5 10*3/uL (ref 4.0–10.5)

## 2010-05-17 LAB — TROPONIN I: Troponin I: 0.02 ng/mL (ref 0.00–0.06)

## 2010-05-17 LAB — CK TOTAL AND CKMB (NOT AT ARMC): Total CK: 138 U/L (ref 7–177)

## 2010-05-17 LAB — LIPASE, BLOOD: Lipase: 21 U/L (ref 11–59)

## 2010-05-17 LAB — URINE MICROSCOPIC-ADD ON

## 2010-06-03 ENCOUNTER — Other Ambulatory Visit: Payer: Self-pay | Admitting: Internal Medicine

## 2010-06-17 ENCOUNTER — Encounter: Payer: Self-pay | Admitting: Internal Medicine

## 2010-06-19 ENCOUNTER — Inpatient Hospital Stay (HOSPITAL_COMMUNITY)
Admission: EM | Admit: 2010-06-19 | Discharge: 2010-06-28 | DRG: 092 | Disposition: A | Discharge status: Home / Self Care | Payer: Medicare Other | Attending: Internal Medicine | Admitting: Internal Medicine

## 2010-06-19 DIAGNOSIS — Z79899 Other long term (current) drug therapy: Secondary | ICD-10-CM

## 2010-06-19 DIAGNOSIS — F609 Personality disorder, unspecified: Secondary | ICD-10-CM | POA: Diagnosis present

## 2010-06-19 DIAGNOSIS — F329 Major depressive disorder, single episode, unspecified: Secondary | ICD-10-CM | POA: Diagnosis present

## 2010-06-19 DIAGNOSIS — M329 Systemic lupus erythematosus, unspecified: Secondary | ICD-10-CM | POA: Diagnosis present

## 2010-06-19 DIAGNOSIS — K5900 Constipation, unspecified: Secondary | ICD-10-CM | POA: Diagnosis present

## 2010-06-19 DIAGNOSIS — G620 Drug-induced polyneuropathy: Secondary | ICD-10-CM | POA: Diagnosis present

## 2010-06-19 DIAGNOSIS — K5289 Other specified noninfective gastroenteritis and colitis: Secondary | ICD-10-CM | POA: Diagnosis not present

## 2010-06-19 DIAGNOSIS — M1990 Unspecified osteoarthritis, unspecified site: Secondary | ICD-10-CM | POA: Diagnosis present

## 2010-06-19 DIAGNOSIS — E876 Hypokalemia: Secondary | ICD-10-CM | POA: Diagnosis not present

## 2010-06-19 DIAGNOSIS — R0602 Shortness of breath: Secondary | ICD-10-CM | POA: Diagnosis not present

## 2010-06-19 DIAGNOSIS — L408 Other psoriasis: Secondary | ICD-10-CM | POA: Diagnosis present

## 2010-06-19 DIAGNOSIS — F1920 Other psychoactive substance dependence, uncomplicated: Secondary | ICD-10-CM | POA: Diagnosis present

## 2010-06-19 DIAGNOSIS — D649 Anemia, unspecified: Secondary | ICD-10-CM | POA: Diagnosis present

## 2010-06-19 DIAGNOSIS — R110 Nausea: Secondary | ICD-10-CM | POA: Diagnosis present

## 2010-06-19 DIAGNOSIS — F3289 Other specified depressive episodes: Secondary | ICD-10-CM | POA: Diagnosis present

## 2010-06-19 DIAGNOSIS — C50919 Malignant neoplasm of unspecified site of unspecified female breast: Secondary | ICD-10-CM | POA: Diagnosis present

## 2010-06-19 DIAGNOSIS — G43909 Migraine, unspecified, not intractable, without status migrainosus: Secondary | ICD-10-CM | POA: Diagnosis present

## 2010-06-19 DIAGNOSIS — Z8614 Personal history of Methicillin resistant Staphylococcus aureus infection: Secondary | ICD-10-CM

## 2010-06-19 DIAGNOSIS — C787 Secondary malignant neoplasm of liver and intrahepatic bile duct: Secondary | ICD-10-CM | POA: Diagnosis present

## 2010-06-19 DIAGNOSIS — G894 Chronic pain syndrome: Principal | ICD-10-CM | POA: Diagnosis present

## 2010-06-19 DIAGNOSIS — T451X5A Adverse effect of antineoplastic and immunosuppressive drugs, initial encounter: Secondary | ICD-10-CM | POA: Diagnosis present

## 2010-06-20 ENCOUNTER — Inpatient Hospital Stay (HOSPITAL_COMMUNITY): Payer: Medicare Other

## 2010-06-20 LAB — COMPREHENSIVE METABOLIC PANEL
ALT: 16 U/L (ref 0–35)
AST: 24 U/L (ref 0–37)
Albumin: 3.5 g/dL (ref 3.5–5.2)
CO2: 28 mEq/L (ref 19–32)
Calcium: 8.9 mg/dL (ref 8.4–10.5)
Chloride: 97 mEq/L (ref 96–112)
GFR calc Af Amer: >60 mL/min (ref >60)
GFR calc non Af Amer: 58 mL/min — ABNORMAL LOW (ref >60)
Sodium: 135 mEq/L (ref 135–145)
Total Bilirubin: 0.2 mg/dL — ABNORMAL LOW (ref 0.3–1.2)

## 2010-06-20 LAB — URINALYSIS, ROUTINE W REFLEX MICROSCOPIC
Bilirubin Urine: NEGATIVE
Hgb urine dipstick: NEGATIVE
Ketones, ur: NEGATIVE mg/dL
Nitrite: NEGATIVE
pH: 7.5 (ref 5.0–8.0)

## 2010-06-20 LAB — CBC
Hemoglobin: 11.8 g/dL — ABNORMAL LOW (ref 12.0–15.0)
RBC: 4.16 MIL/uL (ref 3.87–5.11)

## 2010-06-20 LAB — DIFFERENTIAL
Basophils Absolute: 0 10*3/uL (ref 0.0–0.1)
Basophils Relative: 1 % (ref 0–1)
Neutro Abs: 4.5 10*3/uL (ref 1.7–7.7)
Neutrophils Relative %: 62 % (ref 43–77)

## 2010-06-21 ENCOUNTER — Ambulatory Visit: Payer: Medicare Other | Admitting: Internal Medicine

## 2010-06-21 LAB — GLUCOSE, CAPILLARY: Glucose-Capillary: 67 mg/dL — ABNORMAL LOW (ref 70–99)

## 2010-06-22 ENCOUNTER — Inpatient Hospital Stay (HOSPITAL_COMMUNITY): Payer: Medicare Other

## 2010-06-22 LAB — GLUCOSE, CAPILLARY: Glucose-Capillary: 99 mg/dL (ref 70–99)

## 2010-06-24 ENCOUNTER — Inpatient Hospital Stay (HOSPITAL_COMMUNITY): Payer: Medicare Other

## 2010-06-24 LAB — COMPREHENSIVE METABOLIC PANEL
BUN: 14 mg/dL (ref 6–23)
CO2: 34 mEq/L — ABNORMAL HIGH (ref 19–32)
Calcium: 8.8 mg/dL (ref 8.4–10.5)
Creatinine, Ser: 1.01 mg/dL (ref 0.4–1.2)
GFR calc Af Amer: >60 mL/min (ref >60)
GFR calc non Af Amer: 57 mL/min — ABNORMAL LOW (ref >60)
Glucose, Bld: 140 mg/dL — ABNORMAL HIGH (ref 70–99)
Total Bilirubin: 0.6 mg/dL (ref 0.3–1.2)

## 2010-06-24 LAB — DIFFERENTIAL
Basophils Absolute: 0 K/uL (ref 0.0–0.1)
Basophils Relative: 0 % (ref 0–1)
Eosinophils Absolute: 0.2 K/uL (ref 0.0–0.7)
Eosinophils Relative: 1 % (ref 0–5)
Lymphocytes Relative: 12 % (ref 12–46)
Lymphs Abs: 1.9 K/uL (ref 0.7–4.0)
Monocytes Absolute: 1.9 K/uL — ABNORMAL HIGH (ref 0.1–1.0)
Monocytes Relative: 12 % (ref 3–12)
Neutro Abs: 11.6 K/uL — ABNORMAL HIGH (ref 1.7–7.7)
Neutrophils Relative %: 75 % (ref 43–77)

## 2010-06-24 LAB — CBC
HCT: 35.9 % — ABNORMAL LOW (ref 36.0–46.0)
Hemoglobin: 11.2 g/dL — ABNORMAL LOW (ref 12.0–15.0)
MCH: 27.7 pg (ref 26.0–34.0)
MCHC: 31.2 g/dL (ref 30.0–36.0)
MCV: 88.6 fL (ref 78.0–100.0)

## 2010-06-24 LAB — URINALYSIS, ROUTINE W REFLEX MICROSCOPIC
Bilirubin Urine: NEGATIVE
Ketones, ur: NEGATIVE mg/dL
Nitrite: NEGATIVE
pH: 7.5 (ref 5.0–8.0)

## 2010-06-24 LAB — MAGNESIUM: Magnesium: 2.2 mg/dL (ref 1.5–2.5)

## 2010-06-24 MED ORDER — XENON XE 133 GAS
8.1000 | GAS_FOR_INHALATION | Freq: Once | RESPIRATORY_TRACT | Status: AC | PRN
  Administered 2010-06-24: 8.1 via RESPIRATORY_TRACT

## 2010-06-24 MED ORDER — TECHNETIUM TO 99M ALBUMIN AGGREGATED
5.6000 | Freq: Once | INTRAVENOUS | Status: AC | PRN
  Administered 2010-06-24: 5.6 via INTRAVENOUS

## 2010-06-25 LAB — DIFFERENTIAL
Basophils Absolute: 0 10*3/uL (ref 0.0–0.1)
Basophils Relative: 0 % (ref 0–1)
Eosinophils Absolute: 0.1 10*3/uL (ref 0.0–0.7)
Neutro Abs: 11.6 10*3/uL — ABNORMAL HIGH (ref 1.7–7.7)
Neutrophils Relative %: 82 % — ABNORMAL HIGH (ref 43–77)

## 2010-06-25 LAB — COMPREHENSIVE METABOLIC PANEL
AST: 22 U/L (ref 0–37)
Albumin: 3.1 g/dL — ABNORMAL LOW (ref 3.5–5.2)
Alkaline Phosphatase: 92 U/L (ref 39–117)
Chloride: 88 mEq/L — ABNORMAL LOW (ref 96–112)
GFR calc Af Amer: >60 mL/min (ref >60)
Potassium: 3.3 mEq/L — ABNORMAL LOW (ref 3.5–5.1)
Total Bilirubin: 0.4 mg/dL (ref 0.3–1.2)

## 2010-06-25 LAB — WOUND CULTURE

## 2010-06-25 LAB — CBC
Hemoglobin: 10.1 g/dL — ABNORMAL LOW (ref 12.0–15.0)
MCV: 86.5 fL (ref 78.0–100.0)
Platelets: 160 10*3/uL (ref 150–400)
RBC: 3.56 MIL/uL — ABNORMAL LOW (ref 3.87–5.11)
WBC: 14.3 10*3/uL — ABNORMAL HIGH (ref 4.0–10.5)

## 2010-06-25 LAB — URINE CULTURE

## 2010-06-26 LAB — CBC
HCT: 29.8 % — ABNORMAL LOW (ref 36.0–46.0)
MCV: 87.4 fL (ref 78.0–100.0)
Platelets: 149 10*3/uL — ABNORMAL LOW (ref 150–400)
RBC: 3.41 MIL/uL — ABNORMAL LOW (ref 3.87–5.11)
WBC: 6.2 10*3/uL (ref 4.0–10.5)

## 2010-06-26 LAB — DIFFERENTIAL
Eosinophils Absolute: 0.2 10*3/uL (ref 0.0–0.7)
Lymphocytes Relative: 15 % (ref 12–46)
Lymphs Abs: 1 10*3/uL (ref 0.7–4.0)
Neutrophils Relative %: 70 % (ref 43–77)

## 2010-06-26 LAB — COMPREHENSIVE METABOLIC PANEL
ALT: 11 U/L (ref 0–35)
Albumin: 3 g/dL — ABNORMAL LOW (ref 3.5–5.2)
Alkaline Phosphatase: 84 U/L (ref 39–117)
BUN: 13 mg/dL (ref 6–23)
Chloride: 92 mEq/L — ABNORMAL LOW (ref 96–112)
Potassium: 3.3 mEq/L — ABNORMAL LOW (ref 3.5–5.1)
Total Bilirubin: 0.2 mg/dL — ABNORMAL LOW (ref 0.3–1.2)

## 2010-06-27 LAB — CBC
MCH: 27.7 pg (ref 26.0–34.0)
MCHC: 31.3 g/dL (ref 30.0–36.0)
Platelets: 179 10*3/uL (ref 150–400)
RBC: 4.01 MIL/uL (ref 3.87–5.11)
RDW: 13.8 % (ref 11.5–15.5)

## 2010-06-27 LAB — DIFFERENTIAL
Basophils Relative: 1 % (ref 0–1)
Eosinophils Absolute: 0.2 10*3/uL (ref 0.0–0.7)
Eosinophils Relative: 3 % (ref 0–5)
Monocytes Absolute: 0.6 10*3/uL (ref 0.1–1.0)
Monocytes Relative: 9 % (ref 3–12)
Neutrophils Relative %: 59 % (ref 43–77)

## 2010-06-27 LAB — COMPREHENSIVE METABOLIC PANEL
AST: 17 U/L (ref 0–37)
Albumin: 3.5 g/dL (ref 3.5–5.2)
BUN: 11 mg/dL (ref 6–23)
Calcium: 9.5 mg/dL (ref 8.4–10.5)
Creatinine, Ser: 0.86 mg/dL (ref 0.4–1.2)
GFR calc Af Amer: >60 mL/min (ref >60)

## 2010-06-28 NOTE — Discharge Summary (Signed)
NAMEJESSECA, [name] Carr NO.:  [id number]  MEDICAL RECORD NO.:  [id number]           PATIENT TYPE:  I  LOCATION:  1311                         FACILITY:  [facility]  PHYSICIAN:  Talmage Nap, MD  DATE OF BIRTH:  July 11, 1955  DATE OF ADMISSION:  06/19/2010 DATE OF DISCHARGE:  06/28/2010                        DISCHARGE SUMMARY - REFERRING   DISCHARGING PHYSICIAN:  Talmage Nap, M.D.  PRIMARY CARE PHYSICIAN:  Valerie A. Felicity Coyer, M.D.  Haem-Oncology: Fredricka Bonine  DISCHARGE DIAGNOSES: 1. Chronic pain syndrome. 2. Sigmoid colitis. 3. Hypokalemia, corrected. 4. History of metastatic breast carcinoma. 5. Anemia of chronic disorder. 6. Psoriasis, right elbow. 7. History of lupus. 8. History of chronic migraines. 9. History of chronic narcotic dependence. 10.Depression. 11.Personality disorder.  HISTORY:  The patient is a 55 year old Caucasian female with history of metastatic breast CA and chronic narcotic dependence who was admitted to the hospital on Jun 19, 2010, by Dr. Zannie Cove with a 5-day history of pain in the legs and in the arms and this was said to be getting progressively worse.  She denied any systemic symptoms, i.e. no fever, no chills, no rigor.  She denied any involvement of the bowel or of the genitourinary tract.  She denied any dysuria or hematuria.  Her pain was said to be waxing and waning and progressively getting worse.  Hence, she presented to the hospital to be evaluated.  MEDICATIONS:  Please, for her preadmission medications, refer to home medications.  ALLERGIES: 1. REMICADE. 2. METHOTREXATE. 3. NSAID. 4. NUBAIN. 5. BIAXIN. 6. CONTRAST DYE.  PAST SURGICAL HISTORY:  Laceration in the forearm, complicated by MRSA status post debridement.  SOCIAL HISTORY:  Negative for alcohol or tobacco use.  The patient is said to be presently on disability.  FAMILY HISTORY:  Family history is positive for lupus.  REVIEW  OF SYSTEMS:  Review of systems is essentially documented in the initial history and physical.  ADMISSION PHYSICAL EXAMINATION:  VITAL SIGNS:  At the time that the patient was seen by the admitting physician, her temperature 98.2, pulse is 69, blood pressure is 120/65, respiratory rate 20, saturating 98% on room air. OTHER SYSTEMIC EXAMINATIONS:  Not documented in the initial history and physical.  LABORATORY DATA:  Initial complete blood count with differential showed a WBC of 7.2, hemoglobin 11.8, hematocrit of 37.3, MCV of 89.7 with a platelet count of 201.  Normal differentials.  Comprehensive metabolic panel showed a sodium of 135, potassium of 3.9, chloride of 97 with a bicarbonate of 28, glucose is 100, BUN is 13, creatinine is 1.10. Urinalysis unremarkable.  Routine MRSA screening negative.  Ammonia level 3.5.  The wound culture done on May 14th was said to be positive for enterococci species.  Blood culture, no growth.  Magnesium level is 2.4.  Urine culture, no growth.  A repeat complete blood count done on Jun 26, 2010, showed a WBC of 6.2, hemoglobin of 9.7, hematocrit of 29.8, MCV of 87.4 with a platelet count of 149.  Comprehensive metabolic panel done on Jun 26, 2010, showed a sodium of 131, potassium of 3.3, chloride of 92 with a  bicarbonate of 33, glucose is 105, BUN is 13, creatinine 0.80, magnesium level is 2.3 and a repeat complete blood count with differential done on Jun 27, 2010, showed a WBC of 6.6, hemoglobin 11.1, hematocrit of 35.5, MCV of 88.5 with a platelet count of 179.  Normal differentials and comprehensive metabolic panel showed a sodium of 137, potassium of 3.8, chloride of 90 with a bicarbonate of 31, glucose is 97, BUN is 11, creatinine is 0.86 and magnesium level is 2.2.  IMAGING STUDIES:  Imaging studies done include a chest x-ray, which showed the tip of the cavoatrial junction in good position.  Acute abdominal series showed constipation  without bowel obstruction.  Chest x- ray done on Jun 24, 2010, showed low lung volume with no acute cardiopulmonary abnormality.  CT angiogram, no evidence of pulmonary embolism.  CT head normal.  CT of the abdomen and pelvis done on Jun 24, 2010, showed a sigmoid colitis and small hiatal hernia with patulous gastroesophageal junction.  HOSPITAL COURSE:  The patient was admitted to the medical surgical floor with an impression of chronic pain.  She was started on IV Dilaudid that was periodically adjusted and also given fentanyl patch 150 mcg hourly times 72 hours and because of her chronic constipation was given Colace 100 mg p.o. b.i.d.  She was placed on Lovenox 40 mg subcutaneous q.24 hours for GI prophylaxis and she was also given Zofran for nausea.  Also given to the patient for pain control was OxyContin 20 mg p.o. b.i.d. to be held because of sedation.  The patient was seen by me for the very first time in this index admission on Jun 24, 2010, and during this encounter she complained about being short of breath and a subsequent CT thorax with PE protocol ordered was negative for PE and because of chronic constipation that was not resolvable with Colace, she was given Fleet's enema times one and she was able to move her bowels.  She was followed and evaluated by me on a daily basis.  On Jun 25, 2010, a review of the patient's CT of the abdomen which showed sigmoid colitis and subsequently was started on Flagyl 500 mg IV q.8 hours and Cipro 400 mg IV q.12 hours.  She was found to be hypokalemic and subsequently given KCl 20 mEq p.o. b.i.d.  The patient was again evaluated and followed by me on a daily basis and adjustments were made in her pain medications.  She was seen by me today, which is Jun 28, 2010.  The patient was very insistent on going home with her PICC line.  However, after extensive discussion with the patient alongside with my case manager and the RN, she was  agreeable that the PICC line should be taken off.  So far she has remained medically stable.  Occasionally complaining about abdominal discomfort, but there is no nausea or vomiting and no systemic symptoms, i.e. no fever, no chills, no rigor. Examination of the patient was unremarkable.  Her vital signs:  Blood pressure is 130/84, temperature is 98.3, pulse is 82, respiratory rate 20.  She is medically stable.  DISPOSITION:  The plan is for the patient to be discharged home today.  ACTIVITY:  As tolerated.  DIET:  Low-sodium, low-cholesterol diet.  FOLLOWUP:  Follow up with her primary care doctor as well as with oncologist.  DISCHARGE MEDICATIONS:  Medications to be taken at home will include: 1. Ciprofloxacin 500 mg one p.o. b.i.d. for the next 5 days.  2. Bacitracin ointment applied topically daily. 3. Colace 100 mg two tablets p.o. b.i.d. 4. Cymbalta (duloxetine) 60 mg one p.o. b.i.d. 5. Dilaudid (hydromorphone) 4 mg one p.o. q.4 p.r.n. 6. Fentanyl patch 100 mcg per hour patch transdermal q.72 h. 7. Fentanyl patch 50 mg per hour transdermal patch q.72 h. 8. Gabapentin 300 mg three capsules p.o. t.i.d. 9. Hydrochlorothiazide 25 mg one p.o. daily. 10.Hydrocortisone topical cream apply daily. 11.Klonopin (clonazepam) 1 mg p.o. b.i.d. 12.Maalox (magnesium hydroxide/aluminum hydroxide) 30 cc p.o. b.i.d.     p.r.n. 13.Migraine cocktail (diphenhydramine/ketorolac) IM p.r.n. 14.Nystatin topical cream applied daily p.r.n. 15.Nystatin topical powder apply topically. 16.Phenergan (promethazine) 25 mg one to two tablets p.o. q.6 h.     p.r.n. 17.Phenergan rectal suppository (promethazine) 25 mg q.4 p.r.n. 18.Prednisone 5 mg one p.o. daily with meals. 19.Prilosec (omeprazole) 40 mg one p.o. daily. 20.Potassium chloride 20 mEq one p.o. daily. 21.Senna/docusate four tablets p.o. daily p.r.n. 22.Soma (carisoprodol) 350 mg one to two tablets p.o. q.4-6 h.  P.r.n. 23.Spironolactone/hydrochlorothiazide 25/25 one p.o. b.i.d. 24.Trazodone 150 mg one to two tablets p.o. daily at bedtime. 25.Tums (calcium carbonate) over-the-counter two tablets p.o. q.a.m.     Talmage Nap, MD     CN/MEDQ  D:  06/28/2010  T:  06/28/2010  Job:  [id number]  cc:   Vikki Ports A. Felicity Coyer, MD [address] [place], [place] [address]  Electronically Signed by Talmage Nap  on 06/28/2010 06:27:38 PM

## 2010-06-30 LAB — CULTURE, BLOOD (ROUTINE X 2)
Culture  Setup Time: 201205170925
Culture: NO GROWTH

## 2010-07-07 ENCOUNTER — Other Ambulatory Visit: Payer: Self-pay

## 2010-07-07 MED ORDER — CLONAZEPAM 1 MG PO TABS: 1.0000 mg | ORAL_TABLET | Freq: Every evening | ORAL | Status: DC | PRN

## 2010-07-07 NOTE — Telephone Encounter (Signed)
Rx faxed to pharmacy  

## 2010-07-14 ENCOUNTER — Ambulatory Visit (INDEPENDENT_AMBULATORY_CARE_PROVIDER_SITE_OTHER): Payer: Medicare Other | Admitting: Internal Medicine

## 2010-07-14 ENCOUNTER — Encounter: Payer: Self-pay | Admitting: Internal Medicine

## 2010-07-14 DIAGNOSIS — C50919 Malignant neoplasm of unspecified site of unspecified female breast: Secondary | ICD-10-CM

## 2010-07-14 DIAGNOSIS — G894 Chronic pain syndrome: Secondary | ICD-10-CM

## 2010-07-14 DIAGNOSIS — F329 Major depressive disorder, single episode, unspecified: Secondary | ICD-10-CM

## 2010-07-14 DIAGNOSIS — F3289 Other specified depressive episodes: Secondary | ICD-10-CM

## 2010-07-14 DIAGNOSIS — L409 Psoriasis, unspecified: Secondary | ICD-10-CM

## 2010-07-14 DIAGNOSIS — L408 Other psoriasis: Secondary | ICD-10-CM

## 2010-07-14 MED ORDER — EPINEPHRINE 0.3 MG/0.3ML IJ DEVI: 0.3000 mg | Freq: Once | INTRAMUSCULAR | Status: DC

## 2010-07-14 MED ORDER — CLONAZEPAM 1 MG PO TABS: 1.0000 mg | ORAL_TABLET | Freq: Two times a day (BID) | ORAL | Status: DC | PRN

## 2010-07-14 MED ORDER — CLOBETASOL PROPIONATE 0.05 % EX OINT: TOPICAL_OINTMENT | Freq: Two times a day (BID) | CUTANEOUS | Status: DC

## 2010-07-14 NOTE — Patient Instructions (Signed)
It was good to see you today. We have reviewed your prior records including labs and tests today Medications reviewed, see below for changes at this time. Refill on medication(s) as discussed today. Let us know if you need referral to [facility] oncology group or if you will continue to follow with Dr. Jeanie Sewer Please schedule followup in 3-4 months, call sooner if problems.

## 2010-07-14 NOTE — Progress Notes (Signed)
  Subjective:    Patient ID: [name] Carr, female    DOB: 07/15/1955, 55 y.o.   MRN: [id number]  HPI Here for follow up - interval hx since last OV reviewed - complains of skin wound R breast - denies injury or surgery at same location Tender and swollen but no drainage, fever or redness of breast  R Breast cancer with reported liver mets - s/p chemo [place] [facility] but now on hold - pt reports interest in palliative approach and no resumption of chemo, but may need new oncologist in [place] because has been dismissed by her onc in [place] in past few weeks while hospitalized at [facility]..  hosp 06/2010 for colitis and abd pain - GI symptoms resolved - pain level at baseline  Chronic pain - managed by pain clinic Dr. Nilsa Nutting  SLE - hx pericarditis - on chronic pred for same - still follows with rheum at [facility] but not seen in past months - no fever, no new rash - plaque L elbow from psoriasis - ?cream for same  Past Medical History  Diagnosis Date  . PERSONALITY DISORDER   . Chronic pain syndrome     chronic narcotics, dependancy hx - [facility] for pain mgmt  . MIGRAINE HEADACHE   . Lyme disease   . ALLERGIC RHINITIS   . ANEMIA-IRON DEFICIENCY   . HYPERLIPIDEMIA   . GERD   . DIABETES MELLITUS, TYPE II   . DEPRESSION   . SEIZURE DISORDER   . CARCINOMA, BREAST 08/2009 dx    R breast, reports mets to liver  . SLE (systemic lupus erythematosus)     [place] [place] - chronic pred  . Psoriasis     Review of Systems  Constitutional: Positive for fatigue. Negative for fever.  Respiratory: Negative for shortness of breath.   Cardiovascular: Negative for leg swelling.  Psychiatric/Behavioral: Negative for confusion.       Objective:   Physical Exam BP 118/70  Pulse 80  Temp(Src) 97.7 F (36.5 C) (Oral)  Ht 5' 4.5" (1.638 m)  Wt 194 lb (87.998 kg)  BMI 32.79 kg/m2  SpO2 96% Physical Exam  Constitutional: She is obese; oriented to person, place, and time. She appears well-developed and  well-nourished. No distress.  HENT: Head: Normocephalic and atraumatic. Ears; B TMs ok, no erythema or effusion; Nose: Nose normal.  Mouth/Throat: Oropharynx is clear and moist. No oropharyngeal exudate.  Eyes: Conjunctivae and EOM are normal. Pupils are equal, round, and reactive to light. No scleral icterus.  Neck: Thick; Normal range of motion. Neck supple. No JVD present. No thyromegaly present.  Cardiovascular: Normal rate, regular rhythm and normal heart sounds.  No murmur heard. No BLE edema. Pulmonary/Chest: Effort normal and breath sounds normal. No respiratory distress. She has no wheezes.   Abdomen: obese, soft, non tender and non distended Skin:R lateral breast at 9o'clock region with 2cmx1cm granulated wound - no cellulitis or fluctuance, no drainage or odor.  Psorasis rash/plaque noted over right elbow, extensor surface. No erythema.  Psychiatric: She has a normal mood and affect. Her behavior is normal. Judgment and thought content normal.        Assessment & Plan:  See problem list. Medications and labs reviewed today. Time spent with pt today 45 minutes, greater than 50% time spent counseling patient on breast cancer, colitis, breast wound and medication review. Also review of prior records

## 2010-07-14 NOTE — Assessment & Plan Note (Signed)
Dx 08/2009 - follows with onc [place] [facility] - susan williford, but may considering change to [place] provider Mets to liver reported by pt - last chemo 04/2010 and no plans to resume due to quality of life issues, neuropathy Superficial wound on right breast - continue silvadene and local care with follow up at onc as planned

## 2010-07-15 ENCOUNTER — Encounter: Payer: Self-pay | Admitting: Internal Medicine

## 2010-07-15 NOTE — Assessment & Plan Note (Signed)
Narcotic dependency - follows with pain mgmt for same No change in med mgmt as ongoing

## 2010-07-15 NOTE — Assessment & Plan Note (Signed)
Long hx same complicated with personality disorder and medical comorbidities Reports long use of bid klonopin - rx adjustment done Cont cymbalta, trazadone and BZ

## 2010-07-17 ENCOUNTER — Emergency Department (HOSPITAL_COMMUNITY)
Admission: EM | Admit: 2010-07-17 | Discharge: 2010-07-18 | Disposition: A | Discharge status: Home / Self Care | Payer: Medicare Other | Attending: Emergency Medicine | Admitting: Emergency Medicine

## 2010-07-17 DIAGNOSIS — C50919 Malignant neoplasm of unspecified site of unspecified female breast: Secondary | ICD-10-CM | POA: Insufficient documentation

## 2010-07-17 DIAGNOSIS — N390 Urinary tract infection, site not specified: Secondary | ICD-10-CM | POA: Insufficient documentation

## 2010-07-17 DIAGNOSIS — R197 Diarrhea, unspecified: Secondary | ICD-10-CM | POA: Insufficient documentation

## 2010-07-17 DIAGNOSIS — G8929 Other chronic pain: Secondary | ICD-10-CM | POA: Insufficient documentation

## 2010-07-17 DIAGNOSIS — E119 Type 2 diabetes mellitus without complications: Secondary | ICD-10-CM | POA: Insufficient documentation

## 2010-07-17 DIAGNOSIS — C801 Malignant (primary) neoplasm, unspecified: Secondary | ICD-10-CM | POA: Insufficient documentation

## 2010-07-17 DIAGNOSIS — R112 Nausea with vomiting, unspecified: Secondary | ICD-10-CM | POA: Insufficient documentation

## 2010-07-18 ENCOUNTER — Emergency Department (HOSPITAL_COMMUNITY): Payer: Medicare Other

## 2010-07-18 LAB — COMPREHENSIVE METABOLIC PANEL
ALT: 12 U/L (ref 0–35)
Alkaline Phosphatase: 83 U/L (ref 39–117)
BUN: 12 mg/dL (ref 6–23)
CO2: 30 mEq/L (ref 19–32)
GFR calc Af Amer: >60 mL/min (ref >60)
GFR calc non Af Amer: 57 mL/min — ABNORMAL LOW (ref >60)
Glucose, Bld: 121 mg/dL — ABNORMAL HIGH (ref 70–99)
Potassium: 3.4 mEq/L — ABNORMAL LOW (ref 3.5–5.1)
Sodium: 136 mEq/L (ref 135–145)
Total Protein: 5.9 g/dL — ABNORMAL LOW (ref 6.0–8.3)

## 2010-07-18 LAB — URINALYSIS, ROUTINE W REFLEX MICROSCOPIC
Bilirubin Urine: NEGATIVE
Hgb urine dipstick: NEGATIVE
Ketones, ur: NEGATIVE mg/dL
Nitrite: NEGATIVE
Protein, ur: NEGATIVE mg/dL
Specific Gravity, Urine: 1.013 (ref 1.005–1.030)
Urobilinogen, UA: 0.2 mg/dL (ref 0.0–1.0)

## 2010-07-18 LAB — CBC
HCT: 35.6 % — ABNORMAL LOW (ref 36.0–46.0)
Hemoglobin: 11.3 g/dL — ABNORMAL LOW (ref 12.0–15.0)
MCH: 27.8 pg (ref 26.0–34.0)
MCHC: 31.7 g/dL (ref 30.0–36.0)
RBC: 4.06 MIL/uL (ref 3.87–5.11)

## 2010-07-18 LAB — DIFFERENTIAL
Basophils Absolute: 0 10*3/uL (ref 0.0–0.1)
Lymphocytes Relative: 26 % (ref 12–46)
Monocytes Absolute: 0.4 10*3/uL (ref 0.1–1.0)
Monocytes Relative: 7 % (ref 3–12)
Neutro Abs: 4.2 10*3/uL (ref 1.7–7.7)
Neutrophils Relative %: 63 % (ref 43–77)

## 2010-07-18 LAB — LIPASE, BLOOD: Lipase: 24 U/L (ref 11–59)

## 2010-07-20 ENCOUNTER — Telehealth: Payer: Self-pay | Admitting: Internal Medicine

## 2010-07-20 MED ORDER — PREDNISONE 5 MG PO TABS: 5.0000 mg | ORAL_TABLET | Freq: Every day | ORAL | Status: DC

## 2010-07-20 NOTE — Telephone Encounter (Signed)
Called pt to verify which pharmacy [facility] [phone]. Rx was fax to correct pharmacy. Called pharmacy spoke with Mijadalia/pharmacist gave clonazepam refill over phone. Let pt know rx should be ready for pick-up today, also pt states she needed refill on her prednisone, also sent electronically to [facility].Marland KitchenMarland Kitchen6/12/12@ 1:49pm/LMB

## 2010-07-20 NOTE — Telephone Encounter (Signed)
Pt request refill of clonazapam,[facility] - pt states she has called pharmacy. [phone]

## 2010-07-29 ENCOUNTER — Telehealth: Payer: Self-pay

## 2010-07-29 DIAGNOSIS — C50919 Malignant neoplasm of unspecified site of unspecified female breast: Secondary | ICD-10-CM

## 2010-07-29 NOTE — Telephone Encounter (Signed)
done

## 2010-07-29 NOTE — Telephone Encounter (Signed)
Pt called requesting referral to Dr Cyndie Chime as discussed at last OV.

## 2010-07-29 NOTE — Telephone Encounter (Signed)
Pt advised and will expect a call from [facility] with appt info 

## 2010-07-31 ENCOUNTER — Other Ambulatory Visit: Payer: Self-pay | Admitting: Internal Medicine

## 2010-08-09 ENCOUNTER — Emergency Department (HOSPITAL_COMMUNITY)
Admission: EM | Admit: 2010-08-09 | Discharge: 2010-08-09 | Disposition: A | Discharge status: Home / Self Care | Payer: Medicare Other | Attending: Emergency Medicine | Admitting: Emergency Medicine

## 2010-08-09 ENCOUNTER — Ambulatory Visit (INDEPENDENT_AMBULATORY_CARE_PROVIDER_SITE_OTHER): Payer: Medicare Other | Admitting: Internal Medicine

## 2010-08-09 ENCOUNTER — Encounter: Payer: Self-pay | Admitting: Internal Medicine

## 2010-08-09 ENCOUNTER — Emergency Department (HOSPITAL_COMMUNITY): Payer: Medicare Other

## 2010-08-09 DIAGNOSIS — G894 Chronic pain syndrome: Secondary | ICD-10-CM

## 2010-08-09 DIAGNOSIS — F3289 Other specified depressive episodes: Secondary | ICD-10-CM | POA: Insufficient documentation

## 2010-08-09 DIAGNOSIS — E119 Type 2 diabetes mellitus without complications: Secondary | ICD-10-CM | POA: Insufficient documentation

## 2010-08-09 DIAGNOSIS — Z853 Personal history of malignant neoplasm of breast: Secondary | ICD-10-CM | POA: Insufficient documentation

## 2010-08-09 DIAGNOSIS — R112 Nausea with vomiting, unspecified: Secondary | ICD-10-CM

## 2010-08-09 DIAGNOSIS — E2749 Other adrenocortical insufficiency: Secondary | ICD-10-CM | POA: Insufficient documentation

## 2010-08-09 DIAGNOSIS — M2550 Pain in unspecified joint: Secondary | ICD-10-CM | POA: Insufficient documentation

## 2010-08-09 DIAGNOSIS — R0602 Shortness of breath: Secondary | ICD-10-CM | POA: Insufficient documentation

## 2010-08-09 DIAGNOSIS — M1990 Unspecified osteoarthritis, unspecified site: Secondary | ICD-10-CM | POA: Insufficient documentation

## 2010-08-09 DIAGNOSIS — R079 Chest pain, unspecified: Secondary | ICD-10-CM | POA: Insufficient documentation

## 2010-08-09 DIAGNOSIS — R109 Unspecified abdominal pain: Secondary | ICD-10-CM

## 2010-08-09 DIAGNOSIS — R509 Fever, unspecified: Secondary | ICD-10-CM | POA: Insufficient documentation

## 2010-08-09 DIAGNOSIS — M329 Systemic lupus erythematosus, unspecified: Secondary | ICD-10-CM | POA: Insufficient documentation

## 2010-08-09 DIAGNOSIS — F329 Major depressive disorder, single episode, unspecified: Secondary | ICD-10-CM | POA: Insufficient documentation

## 2010-08-09 LAB — URINALYSIS, ROUTINE W REFLEX MICROSCOPIC
Glucose, UA: NEGATIVE mg/dL
Nitrite: NEGATIVE
Specific Gravity, Urine: 1.011 (ref 1.005–1.030)
pH: 7.5 (ref 5.0–8.0)

## 2010-08-09 LAB — URINE MICROSCOPIC-ADD ON

## 2010-08-09 LAB — COMPREHENSIVE METABOLIC PANEL
AST: 14 U/L (ref 0–37)
Albumin: 3.5 g/dL (ref 3.5–5.2)
Alkaline Phosphatase: 79 U/L (ref 39–117)
BUN: 15 mg/dL (ref 6–23)
CO2: 27 mEq/L (ref 19–32)
Chloride: 100 mEq/L (ref 96–112)
Creatinine, Ser: 1.02 mg/dL (ref 0.50–1.10)
GFR calc non Af Amer: 56 mL/min — ABNORMAL LOW (ref >60)
Potassium: 4.3 mEq/L (ref 3.5–5.1)
Total Bilirubin: 0.2 mg/dL — ABNORMAL LOW (ref 0.3–1.2)

## 2010-08-09 LAB — DIFFERENTIAL
Basophils Absolute: 0 10*3/uL (ref 0.0–0.1)
Lymphocytes Relative: 20 % (ref 12–46)
Lymphs Abs: 1.7 10*3/uL (ref 0.7–4.0)
Neutro Abs: 5.9 10*3/uL (ref 1.7–7.7)
Neutrophils Relative %: 72 % (ref 43–77)

## 2010-08-09 LAB — TROPONIN I: Troponin I: <0.3 ng/mL (ref <0.30)

## 2010-08-09 LAB — CBC
HCT: 36.4 % (ref 36.0–46.0)
Hemoglobin: 11.5 g/dL — ABNORMAL LOW (ref 12.0–15.0)
MCV: 86.3 fL (ref 78.0–100.0)
RBC: 4.22 MIL/uL (ref 3.87–5.11)
RDW: 14.8 % (ref 11.5–15.5)
WBC: 8.2 10*3/uL (ref 4.0–10.5)

## 2010-08-09 LAB — LIPASE, BLOOD: Lipase: 25 U/L (ref 11–59)

## 2010-08-09 LAB — CK TOTAL AND CKMB (NOT AT ARMC): Total CK: 48 U/L (ref 7–177)

## 2010-08-09 MED ORDER — METOCLOPRAMIDE HCL 5 MG PO TABS: 10.0000 mg | ORAL_TABLET | Freq: Two times a day (BID) | ORAL | Status: DC

## 2010-08-09 MED ORDER — ONDANSETRON HCL 8 MG PO TABS: 8.0000 mg | ORAL_TABLET | Freq: Three times a day (TID) | ORAL | Status: AC | PRN

## 2010-08-09 MED ORDER — PROMETHAZINE HCL 25 MG/ML IJ SOLN
25.0000 mg | Freq: Four times a day (QID) | INTRAMUSCULAR | Status: DC | PRN
  Administered 2010-08-09: 25 mg via INTRAMUSCULAR

## 2010-08-09 MED ORDER — PROMETHAZINE HCL 25 MG RE SUPP: 25.0000 mg | RECTAL | Status: DC | PRN

## 2010-08-09 MED ORDER — MEPERIDINE HCL 100 MG/ML IJ SOLN
100.0000 mg | Freq: Once | INTRAMUSCULAR | Status: AC
  Administered 2010-08-09: 100 mg via INTRAMUSCULAR

## 2010-08-09 MED ORDER — ONDANSETRON 8 MG PO TBDP: 8.0000 mg | ORAL_TABLET | Freq: Three times a day (TID) | ORAL | Status: DC | PRN

## 2010-08-09 NOTE — Assessment & Plan Note (Signed)
Narcotic dependency - follows with pain mgmt for same - [facility] No change in med mgmt as ongoing -  i declined to provide rx for home injectable narcotics or arrange for IP tx at this time but should go to ER prn

## 2010-08-09 NOTE — Patient Instructions (Signed)
It was good to see you today. if your symptoms continue to worsen (pain, fever, etc), or if you are unable take anything by mouth (pills, fluids, etc), you should go to the emergency room for further evaluation and treatment. Demerol with phenergan given in office today Use zofran with phenergan and retry reglan at lower dose for nausea and vomiting symptoms  - Your prescription(s) have been submitted to your pharmacy. Please take as directed and contact our office if you believe you are having problem(s) with the medication(s).

## 2010-08-09 NOTE — Progress Notes (Signed)
Subjective:    Patient ID: [name] Carr, female    DOB: 23-Jul-1955, 55 y.o.   MRN: [id number]  HPI  Here for follow up -complains of uncontrolled pain - unable to keep down dilaudid pills Also nausea and vomiting - similar to 06/2010 hosp Onset 1 week ago - no diarrhea and no fever No travel, no high risk foods Not improved with promethazine or zofran - not using both together Prev on reglan for gastroparesis - stopped due to "violent shaking" ?if can be hospitalized for pain control  Also reviewed chronic medical issues: R Breast cancer with reported liver mets - s/p chemo [place] [facility] but now tx on hold - pt previously reports interest in palliative approach OV 07/14/10 and no resumption of chemo, but now reconsidering - pending eval by new oncologist in [place] because has been dismissed by her onc in [place] while hospitalized [facility] 06/2010.  hosp 06/2010 for colitis and abd pain - GI symptoms recur in past 1 week - see above  Chronic pain - managed by pain clinic Dr. Nilsa Nutting - reports remotely on home self administered narcotic injections and wishes to resume same due to nausea and vomiting and difficulty keeping pills down - duragesic patchs (2-74mcg) "helps but not enough"  SLE - hx pericarditis - on chronic pred for same -  follows with rheum at [facility] but not seen in recent months - no fever, no new rash -   Psoriasis - chronic plaque L elbow - uses steroid cream for same  Past Medical History  Diagnosis Date  . PERSONALITY DISORDER   . Chronic pain syndrome     chronic narcotics, dependancy hx - [facility] for pain mgmt  . MIGRAINE HEADACHE   . Lyme disease   . ALLERGIC RHINITIS   . ANEMIA-IRON DEFICIENCY   . HYPERLIPIDEMIA   . GERD   . DIABETES MELLITUS, TYPE II   . DEPRESSION   . SEIZURE DISORDER   . CARCINOMA, BREAST 08/2009 dx    R breast, reports mets to liver  . SLE (systemic lupus erythematosus)     [place] - chronic pred  . Psoriasis     Review of Systems    Constitutional: Positive for fatigue. Negative for fever.  Respiratory: Negative for shortness of breath.   Cardiovascular: Negative for palpitations.  Gastrointestinal: Negative for blood in stool.  Neurological: Negative for dizziness.       Objective:   Physical Exam  BP 112/74  Pulse 96  Temp(Src) 98.5 F (36.9 C) (Oral)  Ht 5' 4.5" (1.638 m)  Wt 199 lb (90.266 kg)  BMI 33.63 kg/m2  SpO2 99% Physical Exam  Constitutional: She is obese; oriented to person, place, and time. She appears well-developed and well-nourished. No distress. 2 friends at side Cardiovascular: Normal rate, regular rhythm and normal heart sounds.  No murmur heard. No BLE edema. Pulmonary/Chest: Effort normal and breath sounds normal. No respiratory distress. She has no wheezes.   Abdomen: obese, soft, non tender and non distended Psychiatric: She has a normal mood and nonchalant affect. Her behavior is normal. Judgment and thought content normal.       Assessment & Plan:  See problem list. Medications and labs reviewed today.  Abdominal pain with nausea and vomiting - nontoxic exam today, VSS - reports unable to keep down her dilaudid causing pain crisis - pt may go to ER if she feels symptoms severe but i have no reason to recommend hospitalization  at this time - IM demerol+phenergan given today - also ok to use zofran with prometh and resume low dose reglan (high dose previously caused "jerking" but will try again to balance benefit with side effects) - new erx done

## 2010-08-11 NOTE — H&P (Signed)
[name] Carr, [name] Carr NO.:  [id number]  MEDICAL RECORD NO.:  [id number]           PATIENT TYPE:  I  LOCATION:  1311                         FACILITY:  [facility]  PHYSICIAN:  Zannie Cove, MD     DATE OF BIRTH:  Nov 30, 1955  DATE OF ADMISSION:  06/19/2010 DATE OF DISCHARGE:                             HISTORY & PHYSICAL   PRIMARY CARE PHYSICIAN:  Valerie A. Felicity Coyer, MD, pain physician of the Pain Clinic in [place].  ONCOLOGIST:  Fredricka Bonine, in [place].  CHIEF COMPLAINT:  Pain in her arms and legs.  HISTORY OF PRESENT ILLNESS:  Ms. [name] Carr is a 55 year old female with history of chronic degenerative joint disease, chronic narcotic dependence, presented to the hospital with complaints of increasing pain in her arms and legs for the last 5 days.  The patient reports that she has had pain in her arms and legs for the last several months ever since she started chemo back in August 2011 for her stage IV breast cancer. However, her pain started getting increasingly severe at home, pins and needle sharp pain in her legs and was not controlled with 3 doses of IV Dilaudid that she got in the ED and was admitted subsequently for pain control.  In addition, the patient also reports that she has chronic degenerative joint disease in her lower back, which is currently stable. She denies any fevers or chills.  She denies any changes in her bowel habits.  She takes 150 mcg of fentanyl patch, n.p.o. Dilaudid at home for this along with gabapentin, Cymbalta and several other medicines. Denies any trauma.  PAST MEDICAL HISTORY: 1. Significant for stage IV breast cancer, had her last chemo in     February, sees Dr. Fredricka Bonine at [facility]. 2. History of lupus. 3. History of chronic migraines. 4. Chronic narcotic dependence. 5. Chronic pain syndrome. 6. Personality disorder. 7. Depression. 8. History of chronic migraine headaches. 9. Personality disorder,  not otherwise specified.  HOME MEDICATIONS:  Include: 1. Fentanyl patch 150 mcg. 2. P.o. Dilaudid. 3. Cymbalta. 4. Trazodone. 5. Gabapentin, etc. doses of which need to be confirmed.  ALLERGIES:  Include REMICADE, METHOTREXATE, NSAID, NUBAIN, BIAXIN, TORADOL and CONTRAST DYE.  SOCIAL HISTORY:  Lives alone at home, has lady who helps to take care of her.  She is single.  Denies any history of alcohol, smoking or illicit drugs.  FAMILY HISTORY:  Both parents deceased secondary to oat-cell cancer of the lungs.  REVIEW OF SYSTEMS:  Negative except per HPI.  PHYSICAL EXAMINATION:  VITAL SIGNS:  Temperature is 98.2, pulse 69, blood pressure 120/75, respirations 20, satting 98% room air.  LABORATORY DATA:  CBC unremarkable except for hemoglobin 11.8.  BMET is normal.  UA is normal.  ASSESSMENT/PLAN:  Ms. [name] Carr is a 55 year old with: 1. Acute on chronic neuropathy pain. 2. Chronic pain syndrome. 3. Chronic narcotic dependence. 4. Personality disorder, not otherwise specified. 5. History of stage IV breast cancer.  PLAN:  We will continue her fentanyl patch, start her on IV Dilaudid 1-2 mg q.4 h p.r.n., stool softeners, laxatives.  Continue her gabapentin  and Cymbalta.  Unfortunately, she is intolerant to Lyrica, also get a physical therapy evaluation.  I expect this to be a challenge to wean her off narcotics based on prior documentation of difficulty with being able to do this before.  Further management as condition evolves.  Rest of the chronic medical problems remained stable.     Zannie Cove, MD     PJ/MEDQ  D:  06/20/2010  T:  06/20/2010  Job:  [id number]  Electronically Signed by Zannie Cove  on 08/11/2010 02:14:22 PM

## 2010-08-31 ENCOUNTER — Telehealth: Payer: Self-pay

## 2010-08-31 NOTE — Telephone Encounter (Signed)
Tammi called requesting Dr Felicity Coyer either call her directly at [phone] or page her at [phone] to discuss pt.

## 2010-09-01 NOTE — Telephone Encounter (Signed)
i paged Tammi this AM- spoke with her about pt's behavior (demanding port, IV dilaudid, etc) in interaction with them - confirmed this is not unusual behavior for pt - i advised direction of her to her pain mgmt team or ER as needed for future episodes or to direct pt here for non onc mgmt issues

## 2010-09-09 ENCOUNTER — Other Ambulatory Visit: Payer: Self-pay | Admitting: Internal Medicine

## 2010-09-09 NOTE — Telephone Encounter (Signed)
[facility] for Soma only. MD denied the prednisone...09/09/10@1 :25pm/LMB

## 2010-09-09 NOTE — Telephone Encounter (Signed)
Soma yes, pred pak no -

## 2010-09-10 ENCOUNTER — Other Ambulatory Visit: Payer: Self-pay

## 2010-09-10 NOTE — Telephone Encounter (Signed)
Pt called requesting refills of Prednisone 10 mg 1 po qd and Gabapentin 300 mg 3 caps po tid. This is not the same dosages we have on file for her, please advise. Pt also states she take daily prednisone for Lupus.

## 2010-09-13 MED ORDER — PREDNISONE 10 MG PO TABS: 10.0000 mg | ORAL_TABLET | Freq: Every day | ORAL | Status: DC

## 2010-09-13 MED ORDER — GABAPENTIN 300 MG PO CAPS: 900.0000 mg | ORAL_CAPSULE | Freq: Three times a day (TID) | ORAL | Status: DC

## 2010-09-13 NOTE — Telephone Encounter (Signed)
Update med list and refills done as requested - but pt also needs to follow regularly with her rheumatologist for lupus - i need a copy of these consult notes re: lupus if i am going to continue to rx daily pred -i can make referral to rheum if needed - thanks

## 2010-09-13 NOTE — Telephone Encounter (Signed)
Pt advised and states she will contact a Rheumatologist for an appt.

## 2010-09-23 ENCOUNTER — Other Ambulatory Visit: Payer: Self-pay | Admitting: Oncology

## 2010-09-23 ENCOUNTER — Encounter (HOSPITAL_BASED_OUTPATIENT_CLINIC_OR_DEPARTMENT_OTHER): Payer: Medicare Other | Admitting: Oncology

## 2010-09-23 DIAGNOSIS — C50419 Malignant neoplasm of upper-outer quadrant of unspecified female breast: Secondary | ICD-10-CM

## 2010-09-23 LAB — CBC WITH DIFFERENTIAL/PLATELET
BASO%: 0.4 % (ref 0.0–2.0)
EOS%: 0.8 % (ref 0.0–7.0)
MCH: 28.1 pg (ref 25.1–34.0)
MCHC: 33.5 g/dL (ref 31.5–36.0)
MCV: 84 fL (ref 79.5–101.0)
MONO%: 1 % (ref 0.0–14.0)
RBC: 4.68 10*6/uL (ref 3.70–5.45)
RDW: 15.6 % — ABNORMAL HIGH (ref 11.2–14.5)
lymph#: 1.7 10*3/uL (ref 0.9–3.3)

## 2010-09-23 LAB — COMPREHENSIVE METABOLIC PANEL
ALT: 12 U/L (ref 0–35)
AST: 13 U/L (ref 0–37)
Albumin: 4.1 g/dL (ref 3.5–5.2)
Alkaline Phosphatase: 99 U/L (ref 39–117)
BUN: 16 mg/dL (ref 6–23)
Potassium: 2.7 mEq/L — CL (ref 3.5–5.3)
Sodium: 133 mEq/L — ABNORMAL LOW (ref 135–145)
Total Protein: 6.9 g/dL (ref 6.0–8.3)

## 2010-09-27 ENCOUNTER — Telehealth: Payer: Self-pay | Admitting: Internal Medicine

## 2010-09-27 DIAGNOSIS — E876 Hypokalemia: Secondary | ICD-10-CM

## 2010-09-27 NOTE — Telephone Encounter (Signed)
i received fax of labs done at heme-onc office 8/16 showing K=2.7 - please call pt and verify that this has been addressed with her -  if not previously addressed, please ask pt to increase potassium tab from daily to qid x 3 days and recheck lab only K on Thursday (icd9 276.8) - thanks

## 2010-09-27 NOTE — Telephone Encounter (Signed)
Pt informed and labs schedule

## 2010-09-29 ENCOUNTER — Encounter (HOSPITAL_BASED_OUTPATIENT_CLINIC_OR_DEPARTMENT_OTHER): Payer: Medicare Other | Admitting: Oncology

## 2010-09-29 DIAGNOSIS — C50419 Malignant neoplasm of upper-outer quadrant of unspecified female breast: Secondary | ICD-10-CM

## 2010-10-01 ENCOUNTER — Other Ambulatory Visit: Payer: Self-pay | Admitting: Internal Medicine

## 2010-10-01 ENCOUNTER — Other Ambulatory Visit (INDEPENDENT_AMBULATORY_CARE_PROVIDER_SITE_OTHER): Payer: Medicare Other

## 2010-10-01 DIAGNOSIS — E876 Hypokalemia: Secondary | ICD-10-CM

## 2010-10-01 LAB — POTASSIUM: Potassium: 3.1 mEq/L — ABNORMAL LOW (ref 3.5–5.1)

## 2010-10-01 MED ORDER — POTASSIUM CHLORIDE CRYS ER 20 MEQ PO TBCR: 20.0000 meq | EXTENDED_RELEASE_TABLET | Freq: Two times a day (BID) | ORAL | Status: DC

## 2010-10-06 ENCOUNTER — Ambulatory Visit (INDEPENDENT_AMBULATORY_CARE_PROVIDER_SITE_OTHER): Payer: Medicare Other | Admitting: General Surgery

## 2010-10-06 ENCOUNTER — Encounter (INDEPENDENT_AMBULATORY_CARE_PROVIDER_SITE_OTHER): Payer: Self-pay | Admitting: General Surgery

## 2010-10-06 VITALS — BP 136/84 | HR 72 | Temp 97.5°F | Ht 65.0 in | Wt 205.0 lb

## 2010-10-06 DIAGNOSIS — C50919 Malignant neoplasm of unspecified site of unspecified female breast: Secondary | ICD-10-CM

## 2010-10-06 DIAGNOSIS — C787 Secondary malignant neoplasm of liver and intrahepatic bile duct: Secondary | ICD-10-CM

## 2010-10-06 NOTE — Progress Notes (Signed)
Subjective:   Breast cancer  Patient ID: [name] Carr, female   DOB: August 17, 1955, 55 y.o.   MRN: [id number]  HPI Patient is a 55 year old female referred by Dr. Marlena Clipper for consideration for surgical treatment for breast cancer. The patient initially felt a mass in her right breast in July of 2011. Her initial workup and treatment was in  [facility]. I have secondhand reports of her initial findings but not the original studies. 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 PET scan unfortunately also revealed liver and department and biopsy confirmed poorly differentiated adenocarcinoma consistent with metastatic breast.  The patient initially underwent chemotherapy in [place] with Cytoxan and Taxotere. She was switched to a Abraxane due to poor tolerance. She had good clinical response with shrinkage of her breast mass. She subsequently elected to stop treatment. Her Port-A-Cath was removed due to a staph infection. She subsequently ended up seeing Dr. Marlena Clipper and is now on oral Xeloda. CT scan in [place] in May when she presented to the emergency room with multiple complaints did not show any evidence of liver disease.  She is here today to discuss surgical treatment for her primary tumor. Of note is that the patient requests bilateral mastectomy. She does not want to worry about followup and screening of her breasts. She also has macromastia and has back pain that she feels is related to the weight of her breasts and also chronic fungal infections inframammary and for this reason feels that she would also benefit from bilateral mastectomy.  Most recent imaging in [facility] is reviewed. This was just done in the last couple of weeks and by mammogram and her tumor now measures 2.9 cm down from 4.3 cm at presentation. Past Medical History  Diagnosis Date  . PERSONALITY DISORDER   . Chronic pain syndrome     chronic  narcotics, dependancy hx - [facility] for pain mgmt  . MIGRAINE HEADACHE   . Lyme disease   . ALLERGIC RHINITIS   . ANEMIA-IRON DEFICIENCY   . HYPERLIPIDEMIA   . GERD   . DIABETES MELLITUS, TYPE II   . DEPRESSION   . SEIZURE DISORDER   . CARCINOMA, BREAST 08/2009 dx    R breast, reports mets to liver  . SLE (systemic lupus erythematosus)     [facility] - chronic pred  . Psoriasis   . Addison's disease   . Metastases to the liver   . Degenerative joint disease   . Migraine headache    Past Surgical History  Procedure Date  . Breast surgery     breast biopsy  . Liver bopsy   . Port a cath placement   . Port-a-cath removal    Current Outpatient Prescriptions  Medication Sig Dispense Refill  . carisoprodol (SOMA) 350 MG tablet TAKE 1 TABLET BY MOUTH FOUR TIMES DAILY AS NEEDED  120 tablet  0  . clobetasol (TEMOVATE) 0.05 % ointment Apply topically 2 (two) times daily.  30 g  0  . clonazePAM (KLONOPIN) 1 MG tablet Take 1 tablet (1 mg total) by mouth 2 (two) times daily as needed.  60 tablet  6  . docusate sodium (COLACE) 100 MG capsule Take 100 mg by mouth 2 (two) times daily.        . DULoxetine (CYMBALTA) 60 MG capsule Take 60 mg by mouth 2 (two) times daily.        Marland Kitchen EPINEPHrine (EPIPEN) 0.3 mg/0.3  mL DEVI Inject 0.3 mLs (0.3 mg total) into the muscle once.  1 Device  1  . fentaNYL (DURAGESIC - DOSED MCG/HR) 75 MCG/HR Place 2 patches onto the skin every 3 (three) days.        Marland Kitchen gabapentin (NEURONTIN) 300 MG capsule Take 3 capsules (900 mg total) by mouth 3 (three) times daily.  270 capsule  3  . hydrochlorothiazide 25 MG tablet Take 25 mg by mouth daily.        Marland Kitchen HYDROmorphone (DILAUDID) 2 MG tablet Take 2 mg by mouth every 4 (four) hours as needed.        . metoCLOPramide (REGLAN) 5 MG tablet Take 2 tablets (10 mg total) by mouth 2 (two) times daily.  60 tablet  1  . omeprazole (PRILOSEC) 40 MG capsule Take 40 mg by mouth every morning.        . potassium chloride SA  (K-DUR,KLOR-CON) 20 MEQ tablet Take 1 tablet (20 mEq total) by mouth 2 (two) times daily. Or as directed  60 tablet  3  . predniSONE (DELTASONE) 10 MG tablet Take 1 tablet (10 mg total) by mouth daily.  30 tablet  3  . Probiotic Product (PROBIOTIC FORMULA) CAPS Take by mouth daily.        . promethazine (PHENERGAN) 25 MG tablet TAKE 1 TABLET BY MOUTH EVERY 4 HOURS AS NEEDED FOR NAUSEA AND VOMITING  40 tablet  0  . spironolactone-hydrochlorothiazide (ALDACTAZIDE) 25-25 MG per tablet Take 1 tablet by mouth 2 (two) times daily.        . traZODone (DESYREL) 150 MG tablet Take 150 mg by mouth at bedtime.         Current Facility-Administered Medications  Medication Dose Route Frequency Provider Last Rate Last Dose  . promethazine (PHENERGAN) injection 25 mg  25 mg Intramuscular Q6H PRN Rene Paci, MD   25 mg at 08/09/10 1515   Allergies  Allergen Reactions  . Celecoxib   . Infliximab   . Iohexol      Code: SOB   . Ketorolac Tromethamine     REACTION: but pt states not injectable  . Methotrexate   . Nalbuphine   . Nsaids    History  Substance Use Topics  . Smoking status: Never Smoker   . Smokeless tobacco: Not on file  . Alcohol Use: No     .Review of Systems  Constitutional: Positive for chills and fatigue.  Eyes: Positive for visual disturbance.  Cardiovascular: Positive for palpitations.  Gastrointestinal: Positive for nausea and constipation. Negative for diarrhea.  Musculoskeletal: Positive for arthralgias.  Neurological: Positive for headaches.       Objective:   Physical Exam General: Morbid obese Caucasian female in no acute distress Skin: No rash or infection although there may be some minor monilial rash beneath her breasts HEENT: No palpable mass or thyromegaly. Sclera nonicteric. Lungs: Clear without wheezing or increased work of breathing Cardiovascular: Regular rate and rhythm. No murmur. Trace ankle edema. Breasts: Very large and ptotic breasts  bilaterally. There is likely a palpable mass in the lateral aspect of the right breast but this is supple. No skin changes. Left breast negative for masses. Lymph nodes: No cervical or subclavicular or axillary nodes palpable. Abdomen: Obese soft and nontender without masses or organomegaly appreciated Extremities: Trace edema. No joint swelling Neurologic: Alert and fully oriented. Gait normal.    Assessment:     55 year old female with multiple medical problems and morbid obesity who presents with carcinoma  of the right breast metastatic to the liver. She has had a good response to chemotherapy with no tumor apparent in her liver on recent CT scan and shrinkage of the tumor in her breast. We discussed and she understands that this is a treatable but not curable disease. I believe she would benefit from surgical treatment to control her primary tumor as this was likely be an early site a symptomatic recurrence and could become a difficult open wound.  I discussed with her that lobectomy would be a very reasonable choice as it is minimally invasive and would remove her primary tumor. She understands that long-term risks of recurrence or new cancer in the opposite breast are really not very significant in her clinical situation. She however still strongly prefers bilateral mastectomy. She does have ongoing problems from macromastia including back pain and skin rashes. Including these issues I do not think it would be entirely unreasonable to go ahead with bilateral mastectomy as long as she understands all the issues which she appears to. I asked her to think about options for the next few days but if she still prefers bilateral mastectomy I would agree to do this. I will discuss with Dr. Marlena Clipper if she needs a Port-A-Cath placement as well.    Plan:     Patient will call me back next week with her decision. Likely bilateral total mastectomy and Port-A-Cath placement. I will discuss this with Dr.  Marlena Clipper.

## 2010-10-06 NOTE — Patient Instructions (Signed)
We were confirmed by phone next week surgical options we discussed today. Please call Thursday if you have not heard from me.

## 2010-10-08 ENCOUNTER — Telehealth: Payer: Self-pay

## 2010-10-08 ENCOUNTER — Other Ambulatory Visit: Payer: Self-pay | Admitting: Internal Medicine

## 2010-10-08 NOTE — Telephone Encounter (Signed)
Pt informed, 20 meq bid.

## 2010-10-08 NOTE — Telephone Encounter (Signed)
Please continue same k+ supplement

## 2010-10-08 NOTE — Telephone Encounter (Signed)
Pt called stating she was scheduled to have potassium checked at our lab today (please see telephone encounter from 08/20) but she was in ER last in in [facility] for an unrelated problem and had labs done there. Per pt her potassium was 3.7. Does MD have any advisement for pt?

## 2010-10-08 NOTE — Telephone Encounter (Signed)
Sent harcopy to pharmacy

## 2010-10-13 ENCOUNTER — Telehealth (INDEPENDENT_AMBULATORY_CARE_PROVIDER_SITE_OTHER): Payer: Self-pay | Admitting: General Surgery

## 2010-10-13 ENCOUNTER — Encounter (HOSPITAL_BASED_OUTPATIENT_CLINIC_OR_DEPARTMENT_OTHER): Payer: Self-pay | Admitting: *Deleted

## 2010-10-13 ENCOUNTER — Emergency Department (HOSPITAL_BASED_OUTPATIENT_CLINIC_OR_DEPARTMENT_OTHER)
Admission: EM | Admit: 2010-10-13 | Discharge: 2010-10-13 | Disposition: A | Discharge status: Home / Self Care | Payer: Medicare Other | Attending: Emergency Medicine | Admitting: Emergency Medicine

## 2010-10-13 ENCOUNTER — Other Ambulatory Visit: Payer: Self-pay | Admitting: Internal Medicine

## 2010-10-13 DIAGNOSIS — E119 Type 2 diabetes mellitus without complications: Secondary | ICD-10-CM | POA: Insufficient documentation

## 2010-10-13 DIAGNOSIS — L0390 Cellulitis, unspecified: Secondary | ICD-10-CM

## 2010-10-13 DIAGNOSIS — Z79899 Other long term (current) drug therapy: Secondary | ICD-10-CM | POA: Insufficient documentation

## 2010-10-13 DIAGNOSIS — M79609 Pain in unspecified limb: Secondary | ICD-10-CM | POA: Insufficient documentation

## 2010-10-13 DIAGNOSIS — E785 Hyperlipidemia, unspecified: Secondary | ICD-10-CM | POA: Insufficient documentation

## 2010-10-13 DIAGNOSIS — L03119 Cellulitis of unspecified part of limb: Secondary | ICD-10-CM | POA: Insufficient documentation

## 2010-10-13 DIAGNOSIS — L02419 Cutaneous abscess of limb, unspecified: Secondary | ICD-10-CM | POA: Insufficient documentation

## 2010-10-13 DIAGNOSIS — G8929 Other chronic pain: Secondary | ICD-10-CM | POA: Insufficient documentation

## 2010-10-13 HISTORY — DX: Reserved for concepts with insufficient information to code with codable children: IMO0002

## 2010-10-13 MED ORDER — CEPHALEXIN 500 MG PO CAPS: 500.0000 mg | ORAL_CAPSULE | Freq: Four times a day (QID) | ORAL | Status: DC

## 2010-10-13 MED ORDER — HYDROMORPHONE HCL 2 MG PO TABS
2.0000 mg | ORAL_TABLET | Freq: Once | ORAL | Status: DC
  Filled 2010-10-13: qty 1

## 2010-10-13 MED ORDER — ONDANSETRON 4 MG PO TBDP
4.0000 mg | ORAL_TABLET | Freq: Once | ORAL | Status: AC
  Administered 2010-10-13: 4 mg via ORAL
  Filled 2010-10-13: qty 1

## 2010-10-13 MED ORDER — CEPHALEXIN 250 MG PO CAPS
500.0000 mg | ORAL_CAPSULE | Freq: Once | ORAL | Status: AC
  Administered 2010-10-13: 500 mg via ORAL
  Filled 2010-10-13: qty 2

## 2010-10-13 MED ORDER — HYDROCODONE-ACETAMINOPHEN 5-325 MG PO TABS
2.0000 | ORAL_TABLET | Freq: Once | ORAL | Status: AC
  Administered 2010-10-13: 2 via ORAL
  Filled 2010-10-13: qty 2

## 2010-10-13 NOTE — ED Notes (Signed)
Pt assisted back to bed. Dr Bernette Mayers at bedside.

## 2010-10-13 NOTE — ED Notes (Signed)
Pt instructed to call for a ride home before receiving pain meds. Pt states she will try to contact someone to pick her up.

## 2010-10-13 NOTE — ED Notes (Signed)
Pt states she started a new round of chemo pills Monday, which was when the pain started to worsen. Also has been running a low grade temperature since that time.

## 2010-10-13 NOTE — Telephone Encounter (Signed)
Called patient and discussed again her surgical options. She still strongly desires bilateral mastectomy. All her questions were answered and we will schedule this for her.

## 2010-10-13 NOTE — ED Notes (Signed)
Pt assisted to bedside commode. Instructed to use callbell when finished and allow staff to assist her back to bed

## 2010-10-13 NOTE — ED Notes (Signed)
Pt upset about not receiving a dilaudid injection. Pt made aware that we do not have the dilaudid tablets, and she became  Very upset stating "i dont want any pills. im sick cant you see? i dont want pills. Please give me vicodin if anything. Oh gosh im going to report this"

## 2010-10-13 NOTE — ED Notes (Signed)
No ride has come to pick pt up yet. Will continue to wait. Brena aware that her friend has not yet arrived. Lights dimmed for comfort.

## 2010-10-13 NOTE — ED Notes (Signed)
Per EMS: pt called ambulance out to home for c/o "hurting all over", mainly to the bilateral legs. Pt described it as "like a bone marrow transplant".

## 2010-10-13 NOTE — ED Notes (Signed)
Pt's friend arrived to drive her home. Pt assisted from wheelchair to car without difficulty.

## 2010-10-13 NOTE — ED Notes (Signed)
Patient off the bsc without assistance and with no difficulty. Patient back to bed without difficulty.

## 2010-10-13 NOTE — ED Provider Notes (Signed)
History     CSN: [id number] Arrival date & time: 10/13/2010  1:05 AM  Chief Complaint  Patient presents with  . Leg Pain   HPI Pt with multiple chronic pain syndromes reports severe sharp and aching pain in bilateral legs, mostly in the thighs, not controlled with home pain medications. She is currently using fentanyl patches (178mcg/hr) and dilaudid 2mg  PO. She states she has been vomiting for 2 days since starting a new chemo therapy drug and unable to keep down her meds tonight. She has had multiple ED visits for pain complaints and has apparently been going to [facility] ED recently. Seen there twice in the last 5 days.   Past Medical History  Diagnosis Date  . PERSONALITY DISORDER   . Chronic pain syndrome     chronic narcotics, dependancy hx - [facility] for pain mgmt  . MIGRAINE HEADACHE   . Lyme disease   . ALLERGIC RHINITIS   . ANEMIA-IRON DEFICIENCY   . HYPERLIPIDEMIA   . GERD   . DIABETES MELLITUS, TYPE II   . DEPRESSION   . SEIZURE DISORDER   . CARCINOMA, BREAST 08/2009 dx    R breast, reports mets to liver  . SLE (systemic lupus erythematosus)     [place] - chronic pred  . Psoriasis   . Addison's disease   . Metastases to the liver   . Degenerative joint disease   . Migraine headache   . DDD (degenerative disc disease)     Past Surgical History  Procedure Date  . Breast surgery     breast biopsy  . Liver bopsy   . Port a cath placement   . Port-a-cath removal     Family History  Problem Relation Age of Onset  . Lung cancer Mother   . Lung cancer Father   . Arthritis Other   . Diabetes Other   . Hyperlipidemia Other     History  Substance Use Topics  . Smoking status: Never Smoker   . Smokeless tobacco: Not on file  . Alcohol Use: No    OB History    Grav Para Term Preterm Abortions TAB SAB Ect Mult Living                  Review of Systems All other systems reviewed and are negative except as noted in HPI.   Physical Exam    BP 99/59  Pulse 91  Temp(Src) 99.7 F (37.6 C) (Oral)  Resp 18  Ht 5\' 5"  (1.651 m)  Wt 215 lb (97.523 kg)  BMI 35.78 kg/m2  Physical Exam  Nursing note and vitals reviewed. Constitutional: She is oriented to person, place, and time. She appears well-developed and well-nourished.  HENT:  Head: Normocephalic and atraumatic.  Eyes: EOM are normal. Pupils are equal, round, and reactive to light.  Neck: Normal range of motion. Neck supple.  Cardiovascular: Normal rate, normal heart sounds and intact distal pulses.   Pulmonary/Chest: Effort normal and breath sounds normal.  Abdominal: Bowel sounds are normal. She exhibits no distension. There is no tenderness.  Musculoskeletal: Normal range of motion. She exhibits tenderness. She exhibits no edema.       Soft tissue tenderness of the thighs bilaterally. There is mild bilateral lower leg erythema with some warmth, no skin breaks, no edema  Neurological: She is alert and oriented to person, place, and time. No cranial nerve deficit.  Skin: Skin is warm and dry. No rash noted.  Psychiatric:  She has a normal mood and affect.    ED Course  Procedures  MDM Pt has not had any vomiting while in the ED for the last 2 hours. Offered her oral medications for pain and antibiotic for possible early cellulitis of her legs. She became very agitated and argumentative. Demanding IV meds and admission for treatment stating she cannot take antibiotics by mouth and denying that she has any chronic pain. Advised her that she does not show any signs of dehydration or vomiting in the ED and that parenteral medications were not indicated for her. She has no apparent reason for admission.       Charles B. Bernette Mayers, MD 10/13/10 [phone]

## 2010-10-20 ENCOUNTER — Ambulatory Visit (INDEPENDENT_AMBULATORY_CARE_PROVIDER_SITE_OTHER): Payer: Medicare Other | Admitting: Internal Medicine

## 2010-10-20 ENCOUNTER — Other Ambulatory Visit (INDEPENDENT_AMBULATORY_CARE_PROVIDER_SITE_OTHER): Payer: Medicare Other

## 2010-10-20 ENCOUNTER — Encounter: Payer: Self-pay | Admitting: Internal Medicine

## 2010-10-20 VITALS — BP 108/60 | HR 88 | Temp 98.6°F | Ht 66.0 in | Wt 218.1 lb

## 2010-10-20 DIAGNOSIS — R609 Edema, unspecified: Secondary | ICD-10-CM

## 2010-10-20 DIAGNOSIS — G894 Chronic pain syndrome: Secondary | ICD-10-CM

## 2010-10-20 DIAGNOSIS — E876 Hypokalemia: Secondary | ICD-10-CM

## 2010-10-20 DIAGNOSIS — F329 Major depressive disorder, single episode, unspecified: Secondary | ICD-10-CM

## 2010-10-20 DIAGNOSIS — C50919 Malignant neoplasm of unspecified site of unspecified female breast: Secondary | ICD-10-CM

## 2010-10-20 MED ORDER — DULOXETINE HCL 60 MG PO CPEP: 60.0000 mg | ORAL_CAPSULE | Freq: Two times a day (BID) | ORAL | Status: DC

## 2010-10-20 MED ORDER — FUROSEMIDE 20 MG PO TABS: 20.0000 mg | ORAL_TABLET | Freq: Every day | ORAL | Status: DC

## 2010-10-20 NOTE — Patient Instructions (Signed)
It was good to see you today. Test(s) ordered today. Your results will be called to you after review (48-72hours after test completion). If any changes need to be made, you will be notified at that time. Start lasix for swelling and refill Cymbalata - Your prescription(s) have been submitted to your pharmacy. Please take as directed and contact our office if you believe you are having problem(s) with the medication(s). Other Medications reviewed, no changes at this time. Please schedule followup in 4 months, call sooner if problems.

## 2010-10-20 NOTE — Assessment & Plan Note (Signed)
Long hx same complicated with personality disorder and medical comorbidities Reports long use of bid klonopin - rx adjustment done Cont cymbalta, trazadone and BZ - refills provided

## 2010-10-20 NOTE — Assessment & Plan Note (Addendum)
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  PET scan unfortunately also revealed liver involvement and biopsy confirmed poorly differentiated adenocarcinoma consistent with metastatic breast.  initially underwent chemotherapy in [place] with Cytoxan and Taxotere. She was switched to a Abraxane due to poor tolerance. She had good clinical response with shrinkage of her breast mass. She subsequently elected to stop treatment. Her Port-A-Cath was removed due to a staph infection. She subsequently ended up seeing Dr. Marlena Clipper who rx'd oral Xeloda, but not tolerating med due to nausea. CT scan in [place] in May 2012 when she presented to the emergency room with multiple complaints did not show any evidence of liver disease. Most recent imaging in [facility] (mammogram) shows her tumor now measures 2.9 cm down from 4.3 cm at presentation. Planning double mastectomy 11/19/10

## 2010-10-20 NOTE — Assessment & Plan Note (Signed)
Narcotic dependency - follows with pain mgmt for same - [facility] No change in med mgmt as ongoing -

## 2010-10-20 NOTE — Progress Notes (Signed)
Subjective:    Patient ID: [name] Carr, female    DOB: 1955-06-07, 55 y.o.   MRN: [id number]  HPI  Here for follow up - reviewed chronic medical issues:  R Breast cancer with reported liver mets - s/p chemo at [place] but now tx on hold - pt previously reports interest in palliative approach OV 07/14/10 and no resumption of chemo, but now reconsidering - dismissed by her onc in [place] while hospitalized at [facility] 06/2010. Now seen by by Dr. Cyndie Chime & hoxworth for consideration for surgical treatment for breast cancer. The patient initially felt a mass in her right breast in July 2011. 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 PET scan unfortunately also revealed liver involvement and biopsy confirmed poorly differentiated adenocarcinoma consistent with metastatic breast.  initially underwent chemotherapy in [place] with Cytoxan and Taxotere. She was switched to a Abraxane due to poor tolerance. She had good clinical response with shrinkage of her breast mass. She subsequently elected to stop treatment. Her Port-A-Cath was removed due to a staph infection. She subsequently ended up seeing Dr. Marlena Clipper who rx'd oral Xeloda, but not tolerating med due to nausea. CT scan in [place] in May 2012 when she presented to the emergency room with multiple complaints did not show any evidence of liver disease. Most recent imaging in [facility] (mammogram) shows her tumor now measures 2.9 cm down from 4.3 cm at presentation.  Chronic pain - managed by pain clinic Dr. Nilsa Nutting - reports remotely on home self administered narcotic injections and wishes to resume same due to nausea and vomiting and difficulty keeping pills down - duragesic patchs (2-45mcg) "helps but not enough" - requests refill on cymbalta  SLE - hx pericarditis - on chronic pred for same -  follows with rheum at [facility] but not seen in recent months - no fever, no new rash    Psoriasis - chronic plaque L elbow - uses steroid cream for same  Past Medical History  Diagnosis Date  . PERSONALITY DISORDER   . Chronic pain syndrome     chronic narcotics, dependancy hx - [place] for pain mgmt  . MIGRAINE HEADACHE   . Lyme disease   . ALLERGIC RHINITIS   . ANEMIA-IRON DEFICIENCY   . HYPERLIPIDEMIA   . GERD   . DIABETES MELLITUS, TYPE II   . DEPRESSION   . SEIZURE DISORDER   . CARCINOMA, BREAST 08/2009 dx    R breast, reports mets to liver  . SLE (systemic lupus erythematosus)     [place] ([place] - chronic pred  . Psoriasis   . Addison's disease   . Metastases to the liver   . Degenerative joint disease   . Migraine headache   . DDD (degenerative disc disease)     Review of Systems  Constitutional: Positive for fatigue. Negative for fever.  Respiratory: Negative for shortness of breath.   Cardiovascular: Negative for palpitations.  Gastrointestinal: Negative for blood in stool.  Neurological: Negative for dizziness.       Objective:   Physical Exam  BP 108/60  Pulse 88  Temp(Src) 98.6 F (37 C) (Oral)  Ht 5\' 6"  (1.676 m)  Wt 218 lb 1.9 oz (98.939 kg)  BMI 35.21 kg/m2  SpO2 93% Wt Readings from Last 3 Encounters:  10/20/10 218 lb 1.9 oz (98.939 kg)  10/13/10 215 lb (97.523 kg)  10/06/10 205 lb (92.987 kg)   Constitutional: She  is obese; oriented to person, place, and time. She appears well-developed and well-nourished. No distress.  Cardiovascular: Normal rate, regular rhythm and normal heart sounds.  No murmur heard. 1+ pitting BLE edema. Pulmonary/Chest: Effort normal and breath sounds normal. No respiratory distress. She has no wheezes.   Abdomen: obese, soft, non tender and non distended Psychiatric: She has a normal mood and nonchalant affect. Her behavior is normal. Judgment and thought content normal.       Assessment & Plan:  See problem list. Medications and labs reviewed today.  Edema - resume Lasix

## 2010-10-26 ENCOUNTER — Encounter (HOSPITAL_BASED_OUTPATIENT_CLINIC_OR_DEPARTMENT_OTHER): Payer: Medicare Other | Admitting: Oncology

## 2010-10-26 DIAGNOSIS — Z171 Estrogen receptor negative status [ER-]: Secondary | ICD-10-CM

## 2010-10-26 DIAGNOSIS — IMO0001 Reserved for inherently not codable concepts without codable children: Secondary | ICD-10-CM

## 2010-10-26 DIAGNOSIS — C50519 Malignant neoplasm of lower-outer quadrant of unspecified female breast: Secondary | ICD-10-CM

## 2010-10-26 DIAGNOSIS — Z23 Encounter for immunization: Secondary | ICD-10-CM

## 2010-11-02 ENCOUNTER — Telehealth: Payer: Self-pay | Admitting: *Deleted

## 2010-11-02 MED ORDER — SPIRONOLACTONE-HCTZ 25-25 MG PO TABS: 1.0000 | ORAL_TABLET | Freq: Two times a day (BID) | ORAL | Status: DC

## 2010-11-02 NOTE — Telephone Encounter (Signed)
Pt left vm need new rx for spironlactone. [facility] in [facility].Marland KitchenMarland KitchenNotified pt  [facility] med to [facility]/ 11/02/10@12 :08pm/LMB

## 2010-11-04 ENCOUNTER — Emergency Department (HOSPITAL_COMMUNITY)
Admission: EM | Admit: 2010-11-04 | Discharge: 2010-11-05 | Disposition: A | Discharge status: Home / Self Care | Payer: Medicare Other | Attending: Emergency Medicine | Admitting: Emergency Medicine

## 2010-11-04 ENCOUNTER — Emergency Department (HOSPITAL_COMMUNITY): Payer: Medicare Other

## 2010-11-04 DIAGNOSIS — R079 Chest pain, unspecified: Secondary | ICD-10-CM | POA: Insufficient documentation

## 2010-11-04 DIAGNOSIS — Z79899 Other long term (current) drug therapy: Secondary | ICD-10-CM | POA: Insufficient documentation

## 2010-11-04 DIAGNOSIS — N390 Urinary tract infection, site not specified: Secondary | ICD-10-CM | POA: Insufficient documentation

## 2010-11-04 DIAGNOSIS — E119 Type 2 diabetes mellitus without complications: Secondary | ICD-10-CM | POA: Insufficient documentation

## 2010-11-04 DIAGNOSIS — R109 Unspecified abdominal pain: Secondary | ICD-10-CM | POA: Insufficient documentation

## 2010-11-04 DIAGNOSIS — C50919 Malignant neoplasm of unspecified site of unspecified female breast: Secondary | ICD-10-CM | POA: Insufficient documentation

## 2010-11-04 DIAGNOSIS — M329 Systemic lupus erythematosus, unspecified: Secondary | ICD-10-CM | POA: Insufficient documentation

## 2010-11-04 DIAGNOSIS — C787 Secondary malignant neoplasm of liver and intrahepatic bile duct: Secondary | ICD-10-CM | POA: Insufficient documentation

## 2010-11-04 DIAGNOSIS — E2749 Other adrenocortical insufficiency: Secondary | ICD-10-CM | POA: Insufficient documentation

## 2010-11-04 LAB — URINALYSIS, ROUTINE W REFLEX MICROSCOPIC
Ketones, ur: 15 — AB
Nitrite: NEGATIVE
Nitrite: NEGATIVE
Protein, ur: NEGATIVE
Specific Gravity, Urine: 1.012 (ref 1.005–1.030)
Urobilinogen, UA: 0.2 mg/dL (ref 0.0–1.0)
pH: 6.5

## 2010-11-04 LAB — URINE MICROSCOPIC-ADD ON

## 2010-11-04 LAB — BASIC METABOLIC PANEL
BUN: 13
Calcium: 9.6
Creatinine, Ser: 1
GFR calc non Af Amer: 58 — ABNORMAL LOW

## 2010-11-04 LAB — DIFFERENTIAL
Lymphocytes Relative: 7 — ABNORMAL LOW
Lymphs Abs: 0.6 — ABNORMAL LOW
Neutrophils Relative %: 87 — ABNORMAL HIGH

## 2010-11-04 LAB — CBC
Platelets: 150
WBC: 9.5

## 2010-11-05 LAB — DIFFERENTIAL
Basophils Absolute: 0 10*3/uL (ref 0.0–0.1)
Basophils Relative: 0 % (ref 0–1)
Eosinophils Absolute: 0.2 10*3/uL (ref 0.0–0.7)
Eosinophils Relative: 2 % (ref 0–5)
Lymphocytes Relative: 23 % (ref 12–46)
Lymphs Abs: 2.1 10*3/uL (ref 0.7–4.0)
Monocytes Absolute: 0.9 10*3/uL (ref 0.1–1.0)
Monocytes Relative: 10 % (ref 3–12)
Neutro Abs: 5.8 10*3/uL (ref 1.7–7.7)
Neutrophils Relative %: 65 % (ref 43–77)

## 2010-11-05 LAB — COMPREHENSIVE METABOLIC PANEL
ALT: 13 U/L (ref 0–35)
AST: 20 U/L (ref 0–37)
Albumin: 3.5 g/dL (ref 3.5–5.2)
Alkaline Phosphatase: 76 U/L (ref 39–117)
BUN: 25 mg/dL — ABNORMAL HIGH (ref 6–23)
CO2: 34 mEq/L — ABNORMAL HIGH (ref 19–32)
Calcium: 9 mg/dL (ref 8.4–10.5)
Chloride: 95 mEq/L — ABNORMAL LOW (ref 96–112)
Creatinine, Ser: 1.07 mg/dL (ref 0.50–1.10)
GFR calc Af Amer: >60 mL/min (ref >60)
GFR calc non Af Amer: 53 mL/min — ABNORMAL LOW (ref >60)
Glucose, Bld: 85 mg/dL (ref 70–99)
Potassium: 2.8 mEq/L — ABNORMAL LOW (ref 3.5–5.1)
Sodium: 138 mEq/L (ref 135–145)
Total Bilirubin: 0.2 mg/dL — ABNORMAL LOW (ref 0.3–1.2)
Total Protein: 6.4 g/dL (ref 6.0–8.3)

## 2010-11-05 LAB — CBC
HCT: 32.5 % — ABNORMAL LOW (ref 36.0–46.0)
Hemoglobin: 10.7 g/dL — ABNORMAL LOW (ref 12.0–15.0)
MCH: 27.9 pg (ref 26.0–34.0)
MCHC: 32.9 g/dL (ref 30.0–36.0)
MCV: 84.6 fL (ref 78.0–100.0)
Platelets: 186 10*3/uL (ref 150–400)
RBC: 3.84 MIL/uL — ABNORMAL LOW (ref 3.87–5.11)
RDW: 15.7 % — ABNORMAL HIGH (ref 11.5–15.5)
WBC: 9 10*3/uL (ref 4.0–10.5)

## 2010-11-05 LAB — GLUCOSE, CAPILLARY: Glucose-Capillary: 121 mg/dL — ABNORMAL HIGH (ref 70–99)

## 2010-11-06 LAB — URINE CULTURE: Colony Count: 70000

## 2010-11-11 ENCOUNTER — Encounter (HOSPITAL_COMMUNITY)
Admission: RE | Admit: 2010-11-11 | Discharge: 2010-11-11 | Disposition: A | Discharge status: Home / Self Care | Payer: Medicare Other | Source: Ambulatory Visit | Attending: General Surgery | Admitting: General Surgery

## 2010-11-11 LAB — CBC
HCT: 40.1 % (ref 36.0–46.0)
Hemoglobin: 13.1 g/dL (ref 12.0–15.0)
MCH: 27.5 pg (ref 26.0–34.0)
MCHC: 32.7 g/dL (ref 30.0–36.0)
MCV: 84.1 fL (ref 78.0–100.0)
RBC: 4.77 MIL/uL (ref 3.87–5.11)

## 2010-11-11 LAB — COMPREHENSIVE METABOLIC PANEL
ALT: 17 U/L (ref 0–35)
BUN: 24 mg/dL — ABNORMAL HIGH (ref 6–23)
CO2: 30 mEq/L (ref 19–32)
Calcium: 9.5 mg/dL (ref 8.4–10.5)
GFR calc Af Amer: 61 mL/min — ABNORMAL LOW (ref >90)
GFR calc non Af Amer: 53 mL/min — ABNORMAL LOW (ref >90)
Glucose, Bld: 125 mg/dL — ABNORMAL HIGH (ref 70–99)
Total Protein: 7.4 g/dL (ref 6.0–8.3)

## 2010-11-11 LAB — SURGICAL PCR SCREEN: MRSA, PCR: NEGATIVE

## 2010-11-18 ENCOUNTER — Telehealth (INDEPENDENT_AMBULATORY_CARE_PROVIDER_SITE_OTHER): Payer: Self-pay

## 2010-11-18 NOTE — Telephone Encounter (Signed)
Pt called and states she is scheduled for surgery tomorrow.  She had a fever of 101.8 this am and sweats.  She took Tylenol and her temp is now 97 degrees.  She has no cold symptoms or congestion and no signs of open wounds or skin infection.  She was in the hospital 2 days ago for chest pain and pressure at [facility].  All tests were negative and they ruled it out as stress/anxiety.  I spoke to Dr Biagio Quint who advised she can still go for surgery tomorrow with the understanding that surgery could be cancelled if any sign of infection is found.  I left Dr Johna Sheriff a voicemail and notified the patient.  She agrees.

## 2010-11-19 ENCOUNTER — Inpatient Hospital Stay (HOSPITAL_COMMUNITY): Payer: Medicare Other

## 2010-11-19 ENCOUNTER — Other Ambulatory Visit (INDEPENDENT_AMBULATORY_CARE_PROVIDER_SITE_OTHER): Payer: Self-pay | Admitting: General Surgery

## 2010-11-19 ENCOUNTER — Inpatient Hospital Stay (HOSPITAL_COMMUNITY)
Admission: RE | Admit: 2010-11-19 | Discharge: 2010-11-22 | DRG: 580 | Disposition: A | Discharge status: Home / Self Care | Payer: Medicare Other | Source: Ambulatory Visit | Attending: General Surgery | Admitting: General Surgery

## 2010-11-19 DIAGNOSIS — E119 Type 2 diabetes mellitus without complications: Secondary | ICD-10-CM | POA: Diagnosis present

## 2010-11-19 DIAGNOSIS — K219 Gastro-esophageal reflux disease without esophagitis: Secondary | ICD-10-CM | POA: Diagnosis present

## 2010-11-19 DIAGNOSIS — M329 Systemic lupus erythematosus, unspecified: Secondary | ICD-10-CM | POA: Diagnosis present

## 2010-11-19 DIAGNOSIS — C787 Secondary malignant neoplasm of liver and intrahepatic bile duct: Secondary | ICD-10-CM | POA: Diagnosis present

## 2010-11-19 DIAGNOSIS — C50919 Malignant neoplasm of unspecified site of unspecified female breast: Principal | ICD-10-CM | POA: Diagnosis present

## 2010-11-19 DIAGNOSIS — D249 Benign neoplasm of unspecified breast: Secondary | ICD-10-CM

## 2010-11-19 DIAGNOSIS — IMO0002 Reserved for concepts with insufficient information to code with codable children: Secondary | ICD-10-CM

## 2010-11-19 DIAGNOSIS — Z01812 Encounter for preprocedural laboratory examination: Secondary | ICD-10-CM

## 2010-11-19 DIAGNOSIS — Z79899 Other long term (current) drug therapy: Secondary | ICD-10-CM

## 2010-11-19 HISTORY — PX: MASTECTOMY: SHX3

## 2010-11-19 LAB — GLUCOSE, CAPILLARY
Glucose-Capillary: 128 mg/dL — ABNORMAL HIGH (ref 70–99)
Glucose-Capillary: 130 mg/dL — ABNORMAL HIGH (ref 70–99)

## 2010-11-20 LAB — GLUCOSE, CAPILLARY
Glucose-Capillary: 152 mg/dL — ABNORMAL HIGH (ref 70–99)
Glucose-Capillary: 152 mg/dL — ABNORMAL HIGH (ref 70–99)

## 2010-11-21 LAB — GLUCOSE, CAPILLARY
Glucose-Capillary: 118 mg/dL — ABNORMAL HIGH (ref 70–99)
Glucose-Capillary: 144 mg/dL — ABNORMAL HIGH (ref 70–99)

## 2010-11-22 LAB — GLUCOSE, CAPILLARY

## 2010-11-23 ENCOUNTER — Telehealth (INDEPENDENT_AMBULATORY_CARE_PROVIDER_SITE_OTHER): Payer: Self-pay

## 2010-11-23 NOTE — Telephone Encounter (Signed)
Patient called in inquiring about pain medication, she wanted to know if she could take oxycontin with diluadid?  Patient was informed that she need's to take her medication as directed by the prescribing Physician.  [name] Carr called our office again stating she's has had a continuous fever since she was in the hospital of 100.5, I was having difficulty understanding the patient because her difficulty with speaking clearly (slurring).  Patient states the cost of her medication  (oxycontin) was not covered by her insurance and would like another prescription for pain medication since she couldn't afford this one, I did repeat the question back to the patient? I paged Dr. Johna Sheriff and reviewed this with him while I kept the patient on hold and I was informed that she need's to be seen due to fever and under no circumstances is [name] Carr to receive a prescription for any form of pain medication.

## 2010-11-24 ENCOUNTER — Encounter (INDEPENDENT_AMBULATORY_CARE_PROVIDER_SITE_OTHER): Payer: Medicare Other | Admitting: General Surgery

## 2010-11-26 ENCOUNTER — Emergency Department (HOSPITAL_COMMUNITY)
Admission: EM | Admit: 2010-11-26 | Discharge: 2010-11-26 | Disposition: A | Discharge status: Home / Self Care | Payer: Medicare Other | Attending: Emergency Medicine | Admitting: Emergency Medicine

## 2010-11-26 ENCOUNTER — Emergency Department (HOSPITAL_COMMUNITY): Payer: Medicare Other

## 2010-11-26 ENCOUNTER — Other Ambulatory Visit: Payer: Self-pay | Admitting: Internal Medicine

## 2010-11-26 DIAGNOSIS — E119 Type 2 diabetes mellitus without complications: Secondary | ICD-10-CM | POA: Insufficient documentation

## 2010-11-26 DIAGNOSIS — G43909 Migraine, unspecified, not intractable, without status migrainosus: Secondary | ICD-10-CM | POA: Insufficient documentation

## 2010-11-26 DIAGNOSIS — C787 Secondary malignant neoplasm of liver and intrahepatic bile duct: Secondary | ICD-10-CM | POA: Insufficient documentation

## 2010-11-26 DIAGNOSIS — R112 Nausea with vomiting, unspecified: Secondary | ICD-10-CM | POA: Insufficient documentation

## 2010-11-26 DIAGNOSIS — E2749 Other adrenocortical insufficiency: Secondary | ICD-10-CM | POA: Insufficient documentation

## 2010-11-26 DIAGNOSIS — Z79899 Other long term (current) drug therapy: Secondary | ICD-10-CM | POA: Insufficient documentation

## 2010-11-26 DIAGNOSIS — R509 Fever, unspecified: Secondary | ICD-10-CM | POA: Insufficient documentation

## 2010-11-26 DIAGNOSIS — G8918 Other acute postprocedural pain: Secondary | ICD-10-CM | POA: Insufficient documentation

## 2010-11-26 DIAGNOSIS — M1990 Unspecified osteoarthritis, unspecified site: Secondary | ICD-10-CM | POA: Insufficient documentation

## 2010-11-26 DIAGNOSIS — L930 Discoid lupus erythematosus: Secondary | ICD-10-CM | POA: Insufficient documentation

## 2010-11-26 DIAGNOSIS — E876 Hypokalemia: Secondary | ICD-10-CM | POA: Insufficient documentation

## 2010-11-26 DIAGNOSIS — F329 Major depressive disorder, single episode, unspecified: Secondary | ICD-10-CM | POA: Insufficient documentation

## 2010-11-26 DIAGNOSIS — C50919 Malignant neoplasm of unspecified site of unspecified female breast: Secondary | ICD-10-CM | POA: Insufficient documentation

## 2010-11-26 DIAGNOSIS — R079 Chest pain, unspecified: Secondary | ICD-10-CM | POA: Insufficient documentation

## 2010-11-26 DIAGNOSIS — F3289 Other specified depressive episodes: Secondary | ICD-10-CM | POA: Insufficient documentation

## 2010-11-26 LAB — POCT I-STAT, CHEM 8
Chloride: 92 mEq/L — ABNORMAL LOW (ref 96–112)
Glucose, Bld: 113 mg/dL — ABNORMAL HIGH (ref 70–99)
HCT: 26 % — ABNORMAL LOW (ref 36.0–46.0)
Hemoglobin: 8.8 g/dL — ABNORMAL LOW (ref 12.0–15.0)
Potassium: 2.9 mEq/L — ABNORMAL LOW (ref 3.5–5.1)
Sodium: 133 mEq/L — ABNORMAL LOW (ref 135–145)

## 2010-11-26 LAB — CBC
HCT: 28.3 % — ABNORMAL LOW (ref 36.0–46.0)
MCHC: 31.4 g/dL (ref 30.0–36.0)
RDW: 15.8 % — ABNORMAL HIGH (ref 11.5–15.5)
WBC: 10.3 10*3/uL (ref 4.0–10.5)

## 2010-11-26 LAB — URINALYSIS, ROUTINE W REFLEX MICROSCOPIC
Leukocytes, UA: NEGATIVE
Nitrite: NEGATIVE
Specific Gravity, Urine: 1.013 (ref 1.005–1.030)
Urobilinogen, UA: 0.2 mg/dL (ref 0.0–1.0)
pH: 7 (ref 5.0–8.0)

## 2010-11-26 LAB — POTASSIUM: Potassium: 3.5 mEq/L (ref 3.5–5.1)

## 2010-11-26 LAB — DIFFERENTIAL
Basophils Absolute: 0 10*3/uL (ref 0.0–0.1)
Basophils Relative: 0 % (ref 0–1)
Eosinophils Relative: 1 % (ref 0–5)
Lymphocytes Relative: 20 % (ref 12–46)
Neutro Abs: 7.3 10*3/uL (ref 1.7–7.7)

## 2010-11-27 NOTE — Consult Note (Signed)
[name] Carr, [name] NO.:  [id number]  MEDICAL RECORD NO.:  [id number]  LOCATION:  WLED                         FACILITY:  [facility]  PHYSICIAN:  Ardeth Sportsman, MD     DATE OF BIRTH:  [birth year]  DATE OF CONSULTATION: DATE OF DISCHARGE:                                CONSULTATION   PRIMARY CARE PHYSICIAN:  Valerie A. Felicity Coyer, M.D.  SURGEON:  Lorne Skeens. Hoxworth, M.D.  MEDICAL ONCOLOGIST:  Genene Churn. Cyndie Chime, M.D., F.A.C.P.  REQUESTING PHYSICIAN:  Doug Sou, M.D., [facility].  REASON FOR VISIT: 1. Increased surgical pain, status post mastectomy. 2. Hypokalemia. 3. Anemia.  HISTORY OF PRESENT ILLNESS:  [name] Carr is a 55 year old morbidly obese female with numerous chronic medical problems including chronic pain& narcotic dependence, on fentanyl patches and oral Dilaudid followed by chronic pain specialist; history of chronic migraines, depression, questionable personality disorder, lupus, stage IV breast cancer with metastasis to liver followed by Dr. Fredricka Bonine at [facility].    She had a significant large right breast cancer.  She had good control for metastatic disease and it was felt for local control as she is secondary to lot of recurrent skin infections, fungal infections, and bulkiness, she underwent bilateral mastectomies done by Dr. Johna Sheriff on October 12, 7 days ago.  She stayed for a few days for pain control issues and went home.  She was sent home on oral OxyContin and oxycodone and to continue her baseline fentanyl and Dilaudid.  The patient notes that the oxycodone pills have helped, but do not last very long.  She never got the OxyContin pills secondary to cost issues.    She claimed she had a temperature up to 100.  She had an episode of vomiting.  She has a history of urinary tract infection causing vomiting.  She came to the emergency room today secondary to the vomiting, low-grade fever, and  increased pain.  Workup noted some anemia and decreased potassium.  Chest x-ray showed some gas in the chest wall Dr. Ethelda Chick of [facility] ED requested surgical evaluation.  He was concerned about possible infection and perhaps a seroma or that the incision needed to be opened up.  The patient denies any sick contacts aside from a friend who has bronchitis.  No gastroenteritis, no change in her diet, no rectal bleeding, no hematochezia or hematemesis.  She does have a history of chronic anemia.  She has been using a little Tylenol and again 3 different narcotics.  PAST MEDICAL HISTORY: 1. Noted above with stage IV breast cancer with metastasis to the     liver.  Pathology from last week reveals a 4.3-cm high-grade poorly-     differentiated invasive ductal carcinoma with tumor of 1.4 cm from     nearest margin.  Left breast does not show any malignancy.  Given     her metastatic disease, she did not have a separate lymph node     dissection that day. 2. Other diagnoses include lupus managed on oral prednisone. 3. 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 4. Chronic narcotic dependence and chronic pain followed by a pain     specialist. 5. Depression.  6. Personality disorder. 7. Chronic migraines. 8. Intermittent hypokalemia. 9. History of sigmoid colitis in the past, but no definite flares     recently. 10.History of MRSA in the past.  MEDICATIONS:  Noted in the chart and include; 1. Two fentanyl patches 150 mcg daily. 2. Oxycodone 5 mg p.o. q.4 hours p.r.n. pain. 3. Cymbalta 60 mg p.o. b.i.d. 4. Prednisone 10 mg daily. 5. Neurontin 900 mg p.o. t.i.d. 6. Hydrochlorothiazide 25 mg daily. 7. Lasix 20 mg daily. 8. Reglan 10 mg p.o. b.i.d. 9. Prilosec 20 mg daily. 10.Colace 100 mg daily. 11.Lemon juice, olive oil, and bowel regimen per patient. 12.Klonopin 1 mg b.i.d. 13.Trazodone 150-300 mg nightly. 14.Dilaudid 2 mg p.o. q.4-6 hours p.r.n. pain. 15.Recent records note she uses Tums,  spirolactone, Soma, potassium, and     Phenergan in the past; but she denies it right now.  ALLERGIES:  Numerous and include BIAXIN, IVP DYE, METHOTREXATE, ALL NSAIDS, NUBAIN, PLAQUENIL, __________, VERSED, CELEBREX, AMMONIA, VIOXX, REMICADE, TORADOL, and INSECT STINGS.  SOCIAL HISTORY:  No tobacco use, narcotically dependent, comes in with family member.  FAMILY HISTORY:  Noted for diabetes, hypertension, and stroke.  Some question that she has diabetes as well although I do not see she is on any diabetic medications.  There is question of Addison disease as well that may be the reason why she is on prednisone.  REVIEW OF SYSTEMS:  As noted per HPI.  GENERAL:  She has had low-grade fever.  No chills or sweats and her weight is stable.  ENT:  Negative for now.  CHEST:  No major chest pain or cold, coughs, or flus.  She is walking relatively well. NEUROLOGICAL:  She had a little bit of confusion, but seems to be stable now.  No specific focal weakness. PSYCH:  No auditory or visual hallucinations.  GI:  No severe nausea now.  No diarrhea, no hematochezia or melena as noted above.  GYN, GU, HEME, LYMPH, ALLERGIC:  Otherwise negative.  HEPATIC, RENAL, ENDOCRINE: Otherwise negative except as noted above.  PHYSICAL EXAMINATION:  VITAL SIGNS:  Temperature 99.5, respirations 18, pulse 88, blood pressure was 156/101, currently 136/68. GENERAL:  She is a well-developed, well-nourished, morbidly obese female walking in the hallways, in no acute distress. PSYCH:  She has a little bit of that mild grogginess and fogginess of a narcotic patient, but no evidence of any dementia, delirium, psychosis, or paranoia.  Her recall actually seems pretty good. HEENT:  Eyes; pupils equal, round, reactive to light.  Extraocular movements are intact.  Her sclerae are not really injected.  there is no evidence of any icterus. NEUROLOGICAL:  Cranial nerves II through XII are intact.  Hand grip is 5/5 and  equal and symmetrical.  She has normal gait. NECK:  Supple.  No masses.  I can obviously see the catheter from her Port-A-Cath going to left IJ, normal.  She was convinced something a bit left in there and once I reassured her that that is just the Port-A-Cath tubing, she felt reassured. CHEST:  Clear to auscultation bilaterally.  No wheezes, rales or rhonchi. HEART:  Regular rate and rhythm.  No murmurs, gallops, or rubs. BREASTS:  She is status post bilateral mastectomy and a morbidly obese chest wall.  Staple line looks clear.  There is minimal ecchymoses.  On the right middle area, there is a 1 x 1 cm area of mild erythema at the staples consistent with postoperative changes.  I feel no fluid wave, I see  no enlarged hematoma.  Her right mastectomy drain is gone.  She said she accidentally pulled that out on postop day 1.  Left mastectomy drain is in place with thin serosanguineous fluid.  No evidence of any purulence. ABDOMEN:  Soft, nontender, nondistended. HEENT:  She is normocephalic.  Mucous membranes are moist.  Nasopharynx and oropharynx are clear. SKIN:  I see mastectomy areas as noted above.  I see no cellulitis or any significant psoriasis flares nor any deep fungal infections or appendicular infections.  LABORATORY VALUES:  Her urinalysis is normal.  Her potassium is 2.9 and her sodium is 133.  Hemoglobin is 8.9.  In the past year, it has been 9.7 to 13.1.  White count is normal at 10.3 without a left shift.  Her glucose is running 116 to 144 over the past week, currently at 113 in the ER.  Chest x-ray shows no pneumothorax.  The port is coming into left IJ and internal jugular vein looks stable without any migration.  There are a few dots of subcutaneous area on the chest wall consistent with weak postop, especially on the right side where there is no drain anymore.  ASSESSMENT AND PLAN: 1. Morbidly obese female, heavily narcotically-dependent, chronically     pain  patient who I think felt pine on her pain control secondary to     not feeling her OxyContin, want to stop all oxycodone. 2. Increase Dilaudid to 4-8 mg p.o. q.6 hours p.r.n. pain. 3. Do Tylenol around the clock.  She cannot tolerate other NSAIDs. 4. Ice knee p.r.n. 5. Keep appointment with Dr. Johna Sheriff next week for probable drain     removal. 6. Call if she has worsening drainage or pain from her incisions. 7. Anemia.  Probably postop and stable.  I trust the 10.7 more than     the 13 as that was an outlayer from last week and given bilateral     mastectomies, it would not surprise if she drop to a point or two     after surgery, but she does not look symptomatic from at this     point.  With her history of severe constipation, I would hold off     on doing any iron right now and followup. 8. Increase oral potassium to prior 20 mEq p.o. b.i.d. over the     weekend and then see Dr. Felicity Coyer, her primary care physician early     next week in about 4-5 days to make sure she is doing okay. 9. Follow up with her chronic pain specialist to help further manage. 10.There is no evidence of any abscess or infection.  I would not     change anything on the incision at this time.  I suspect or prior     do some staple removal and probable drain removal next week.  I     discussed this with patient, her friend, the nurse, and Dr.     Ethelda Chick.  All are in agreement with this plan.     Ardeth Sportsman, MD     SCG/MEDQ  D:  11/26/2010  T:  11/26/2010  Job:  [id number]  cc:   Fredricka Bonine, MD Fax: [phone]  Electronically Signed by Karie Soda MD on 11/27/2010 08:11:45 AM

## 2010-11-29 NOTE — Op Note (Signed)
[name] Carr, [name] Carr NO.:  [id number]  MEDICAL RECORD NO.:  [id number]  LOCATION:  2550                         FACILITY:  [facility]  PHYSICIAN:  Sharlet Salina T. Shakaya Bhullar, M.D.DATE OF BIRTH:  August 07, 1955  DATE OF PROCEDURE:  11/19/2010 DATE OF DISCHARGE:                              OPERATIVE REPORT   PREOPERATIVE DIAGNOSIS:  Cancer of the right breast.  POSTOPERATIVE DIAGNOSIS:  Cancer of the right breast.  SURGICAL PROCEDURE: 1. Bilateral total mastectomy. 2. Placement of PowerPort, subcutaneous venous port via left internal     jugular vein.  SURGEON:  Lorne Skeens. Alysa Duca, MD  ASSISTANT:  Abigail Miyamoto, MD.  BRIEF HISTORY:  This patient is a 55 year old female who was diagnosed number of months ago with a large primary invasive ductal carcinoma of the right breast.  She was also found to have a metastatic lesion to the liver biopsy proven.  She underwent neoadjuvant chemotherapy with shrinkage of the mass.  She has been followed by her medical oncologist and myself that local control would be beneficial due to her minimal metastatic disease.  The patient has marked macromastia with fungal infections beneath her breasts and chronic back pain due to the size of her breast and she desires bilateral total mastectomy.  We discussed these procedures in detail with the patient including risks of anesthetic complications, bleeding, infection, wound healing problems and chronic pain.  We discussed alternatives i.e. lumpectomy.  After several discussions and counseling, is very firm and comfortable and decision for bilateral mastectomy.  She will also need venous access and we have recommended placement of a subcutaneous venous port at the same operation.  The nature of the surgery and risks of bleeding, infection, pneumothorax, thrombosis, catheter malfunction were discussed and understood.  She is now brought to operating room for these  procedures.  DESCRIPTION OPERATION:  The patient was brought to the operating room, placed in supine position on the operating table and general orotracheal anesthesia was induced.  The entire chest, upper abdomen, shoulders and neck were then widely sterilely prepped and draped.  She received preoperative IV antibiotics.  PAS were in place.  Correct patient and procedure were verified.  We approached the right side first.  An elliptical incision was planned encompassing the breast nipple-areolar complex obliquely oriented from the sternum up toward the axilla creating fairly short skin and subcutaneous flaps relative the breast size and the patient was not interested in reconstruction.  Incision was made and dissection carried down to the subcutaneous tissue.  Skin and subcutaneous flaps were then raised superiorly toward the clavicle, inferiorly toward the origin of the rectus, medially to the edge of the sternum and laterally out to the anterior border latissimus dorsi which was defined and dissected.  The breast was then reflected up off the anterior chest wall with cautery.  We worked medial to lateral.  The perforators were controlled with cautery or figure-of-eight sutures of 3- 0 Vicryl.  The breast was reflected off of the serratus and lateral edge of the pectoralis major and then freed off of the latissimus up toward the axilla until we were down just to the attachment of the tail of [address]  to the axillary contents.  We then came across the low axilla with [facility] clamps.  The specimens removed and this was tied with 3-0 Vicryl.  The wound was copiously irrigated with saline and complete hemostasis obtained with cautery and a couple of figure-of-eight sutures of Vicryl.  A single close suction drain [address] was left through a lateral stab wound beneath both flaps.  The subcu was closed with running 2-0 Vicryl and skin closed with staples.  Following this, attention was turned to  the right side.  An identical incision, dissection and procedure was then performed on the right side.  There was no gross evidence of tumor at any point during the dissection.  The axilla was grossly negative and again we came across the low axilla with Kelly clamps and tied this with 3-0 Vicryl.  Both specimens were oriented and sent for permanent pathology.  The wounds were closed identically on either side as described previously.  Following this, attention was turned to the Port-A-Cath placement.  I changed gown and gloves.  The left anterior neck was already widely sterilely prepped. Initially, ultrasound was used to localize the left internal jugular vein, and this was marked.  I was then able to easily cannulate the left internal jugular vein at the mid neck with a needle and guidewire, which was threaded distally and the guidewire confirmed in the superior vena cava.  Following this, the introducer was threaded over the guidewire and the flushed catheter placed via the introducer and it was positioned with its tip in the superior vena cava atrial junction after stripping away the introducer.  A site on the left anterior chest wall wasidentified, a small transverse incision was made and subcutaneous pocket created.  The catheter was tunneled subcutaneously to the pocket, trimmed to length and attached to the port which was sutured to the anterior chest wall with interrupted Prolene.  This incision was closed with a running 3-0 Vicryl subcu and skin was closed with Monocryl, Steri- Strips and Dermabond.  Final fluoroscopy confirmed the catheter in good position.  The port was accessed and aspirated and flushed easily and was left accessed and flushed with concentrated heparin solution. Sponge and needle counts correct.  Dry sterile dressings and a breast binder were placed.  The patient was taken to recovery in good condition.     Lorne Skeens. Kijana Cromie, M.D.     Tory Emerald  D:   11/19/2010  T:  11/19/2010  Job:  [id number]  cc:   Levert Feinstein, M.D., F.A.C.P.  Electronically Signed by Glenna Fellows M.D. on 11/29/2010 02:43:55 PM

## 2010-11-30 ENCOUNTER — Inpatient Hospital Stay (HOSPITAL_COMMUNITY)
Admission: RE | Admit: 2010-11-30 | Discharge: 2010-12-08 | DRG: 862 | Disposition: A | Discharge status: Home Health | Payer: Medicare Other | Source: Other Acute Inpatient Hospital | Attending: General Surgery | Admitting: General Surgery

## 2010-11-30 DIAGNOSIS — N61 Mastitis without abscess: Secondary | ICD-10-CM | POA: Diagnosis present

## 2010-11-30 DIAGNOSIS — F329 Major depressive disorder, single episode, unspecified: Secondary | ICD-10-CM | POA: Diagnosis present

## 2010-11-30 DIAGNOSIS — F609 Personality disorder, unspecified: Secondary | ICD-10-CM | POA: Diagnosis present

## 2010-11-30 DIAGNOSIS — IMO0002 Reserved for concepts with insufficient information to code with codable children: Secondary | ICD-10-CM

## 2010-11-30 DIAGNOSIS — Y838 Other surgical procedures as the cause of abnormal reaction of the patient, or of later complication, without mention of misadventure at the time of the procedure: Secondary | ICD-10-CM | POA: Diagnosis present

## 2010-11-30 DIAGNOSIS — Z880 Allergy status to penicillin: Secondary | ICD-10-CM

## 2010-11-30 DIAGNOSIS — T8140XA Infection following a procedure, unspecified, initial encounter: Principal | ICD-10-CM | POA: Diagnosis present

## 2010-11-30 DIAGNOSIS — F3289 Other specified depressive episodes: Secondary | ICD-10-CM | POA: Diagnosis present

## 2010-11-30 DIAGNOSIS — Z8614 Personal history of Methicillin resistant Staphylococcus aureus infection: Secondary | ICD-10-CM

## 2010-11-30 DIAGNOSIS — C50919 Malignant neoplasm of unspecified site of unspecified female breast: Secondary | ICD-10-CM | POA: Diagnosis present

## 2010-11-30 DIAGNOSIS — M329 Systemic lupus erythematosus, unspecified: Secondary | ICD-10-CM | POA: Diagnosis present

## 2010-11-30 DIAGNOSIS — A412 Sepsis due to unspecified staphylococcus: Secondary | ICD-10-CM | POA: Diagnosis present

## 2010-11-30 LAB — DIFFERENTIAL
Eosinophils Absolute: 0.1 10*3/uL (ref 0.0–0.7)
Eosinophils Relative: 1 % (ref 0–5)
Lymphocytes Relative: 16 % (ref 12–46)
Lymphs Abs: 2.7 10*3/uL (ref 0.7–4.0)
Monocytes Relative: 10 % (ref 3–12)

## 2010-11-30 LAB — CBC
HCT: 32.9 % — ABNORMAL LOW (ref 36.0–46.0)
MCH: 26.7 pg (ref 26.0–34.0)
MCV: 83.7 fL (ref 78.0–100.0)
Platelets: 383 10*3/uL (ref 150–400)
RDW: 15.9 % — ABNORMAL HIGH (ref 11.5–15.5)
WBC: 16.9 10*3/uL — ABNORMAL HIGH (ref 4.0–10.5)

## 2010-12-01 LAB — CBC
HCT: 29.4 % — ABNORMAL LOW (ref 36.0–46.0)
Hemoglobin: 9.3 g/dL — ABNORMAL LOW (ref 12.0–15.0)
MCH: 26.5 pg (ref 26.0–34.0)
MCHC: 31.6 g/dL (ref 30.0–36.0)
RDW: 15.8 % — ABNORMAL HIGH (ref 11.5–15.5)

## 2010-12-01 LAB — VANCOMYCIN, TROUGH: Vancomycin Tr: 22.6 ug/mL — ABNORMAL HIGH (ref 10.0–20.0)

## 2010-12-01 LAB — GLUCOSE, CAPILLARY: Glucose-Capillary: 143 mg/dL — ABNORMAL HIGH (ref 70–99)

## 2010-12-02 LAB — CBC
Hemoglobin: 9.5 g/dL — ABNORMAL LOW (ref 12.0–15.0)
MCH: 26.3 pg (ref 26.0–34.0)
MCHC: 30.4 g/dL (ref 30.0–36.0)
MCV: 86.4 fL (ref 78.0–100.0)
RBC: 3.61 MIL/uL — ABNORMAL LOW (ref 3.87–5.11)

## 2010-12-02 LAB — CULTURE, BLOOD (ROUTINE X 2)

## 2010-12-03 ENCOUNTER — Encounter (INDEPENDENT_AMBULATORY_CARE_PROVIDER_SITE_OTHER): Payer: Medicare Other | Admitting: General Surgery

## 2010-12-03 DIAGNOSIS — C50919 Malignant neoplasm of unspecified site of unspecified female breast: Secondary | ICD-10-CM

## 2010-12-03 DIAGNOSIS — T8140XA Infection following a procedure, unspecified, initial encounter: Secondary | ICD-10-CM

## 2010-12-04 LAB — VANCOMYCIN, TROUGH: Vancomycin Tr: 20.2 ug/mL — ABNORMAL HIGH (ref 10.0–20.0)

## 2010-12-04 NOTE — H&P (Signed)
[name] Carr, [name] Carr NO.:  [id number]  MEDICAL RECORD NO.:  [id number]  LOCATION:  1517                         FACILITY:  [facility]  PHYSICIAN:  Sandria Bales. Ezzard Standing, M.D.  DATE OF BIRTH:  Sep 19, 1955  DATE OF ADMISSION:  11/30/2010                              HISTORY & PHYSICAL   HISTORY OF PRESENT ILLNESS:  [name] Carr is a 55 year old white female, who is a patient of Dr. Glenna Fellows, currently seeing Dr. Cephas Darby for the surgical treatment of her breast cancer.  Her story goes back that she actually initially had a right breast cancer diagnosed in July 2011, which was high-grade triple negative.  A PET scan revealed liver mets.  She underwent chemotherapy at [facility] with Cytoxan and Taxotere, had a good clinical response.  She after seeing Dr. Cyndie Chime in [place] was on Xeloda.  A CT scan in May 2012, did not show any evidence of liver disease.  Dr. Johna Sheriff took her to the operating room on November 19, 2010, where he did a bilateral mastectomy and placed a left internal jugular port.  She has been back to the [facility] Emergency Room on October 19 and saw Dr. Michaell Cowing in the emergency room because of increasing pain in her sites, hypokalemia, and anemia, but he let her go home increasing her oral Dilaudid.  She then represented to the [facility] Emergency Room today, but her potassium was low.  She is followed by the [facility] physician, who thought she had increasing redness of her mastectomy incisions of left worse than the right, and white blood count was drawn, that was 21,200, a significant increase from just 3 days earlier.  The [facility] ER physician contacted me, I thought she would be best hospitalized at least for 24-hour observation to make sure there is not something more significant going on.  PAST MEDICAL HISTORY: 1. Significant that she has again a stage IV breast cancer with     evidence of mass of the liver, which  appears to have improved with     her treatment. 2. She has systemic lupus erythematosus. 3. She is on chronic oral prednisone for this. 4. She has psoriasis of her elbows. 5. She is chronic narcotic dependence with chronic back pain and other     pains.  She sees a pain specialist for this. 6. She suffers from depression. 7. She has personality disorder. 8. She has chronic migraines.  This was main reason why she goes to     the [facility] ER.  According to the ER at [facility], she has been     there for some 40 times this year. 9. She has intermittent hypokalemia.  I am not sure how this is     related to her symptoms. 10.She has a history of sigmoid colitis. 11.She has a history of MRSA, though her cultures before her     mastectomy, which showed she was staph positive but not MRSA     positive.  She is a little fuzzy on her medicines what I have that she takes: 1. Fentanyl 150 mg patch every 3 days. 2. She takes oxycodone  5 mg every 4 hours p.r.n., pain. 3. She takes Cymbalta 60 mg b.i.d. 4. She takes prednisone 50 mg daily. 5. She takes Neurontin 100 mg daily. 6. She takes hydrochlorothiazide 25 mg daily. 7. She takes Lasix 20 mg daily. 8. She takes Reglan 10 mg b.i.d. 9. She takes Prilosec 20 mg daily. 10.She takes Colace. 11.She also takes Klonopin and she has Dilaudid 2 mg every 4 to 6     hours p.r.n., pain. She says she has multiple allergies, which include: 1. IVP DYE. 2. CELEBREX. 3. ANY KIND OF NSAID. 4. TORADOL. 5. METHOTREXATE.  PHYSICAL EXAMINATION:  VITAL SIGNS:  Her temperature is 99.9, pulse is 89, respirations 18, blood pressure is 165/70. HEENT:  Unremarkable. NECK:  Supple.  I feel no mass. LUNGS:  Symmetric with good breath sounds. HEART:  Has a regular rate and rhythm.  She has some redness to her left mastectomy incision on left; on the right, she has a drain coming up also, the right-sided syringe fell out.  I probed both the right and left  mastectomy incisions with Q-tips, but got no fluid, actually she is very easily distracted and she is complaining about pain where I even probed this with Q-tips, it did not bother. ABDOMEN:  Soft. EXTREMITIES:  She has scars on both of her forearms.  She said it got cut from tin.  Again, I did not repeat her labs here.  Her labs done in [facility] showed a hemoglobin of 12, hematocrit of 38, white blood count 21,200, platelets were in clumps, I do not see a cannula there.  Sodium is 131, potassium is 3.5, chloride of 89, CO2 of 26, glucose of 192, BUN of 10, creatinine of 1.06.  Her liver functions look okay.  IMPRESSION: 1. Cellulitis of the breast incisions, left worse than right, though     really I think it is not severe as I expected and could possibly if     after 24 hours, there is no worse, can be sent home on oral     antibiotics.  I will plan IV antibiotics for now with vancomycin     and Zosyn, but we will recheck white blood count on her and go from     there. 2. History of metastatic breast cancer, though apparently no gross     injury at this time. 3. Systemic lupus erythematosus. 4. Chronic steroids with oral prednisone. 5. Chronic narcotic dependence with chronic back pain and other     elements. 6. Depression. 7. Personality disorder. 8. Chronic migraines. 9. History of sigmoid colitis.    Sandria Bales. Ezzard Standing, M.D.    DHN/MEDQ  D:  11/30/2010  T:  11/30/2010  Job:  [id number]  cc:   Lorne Skeens. Hoxworth, M.D. [address] [place] [address]  Levert Feinstein, M.D., F.A.C.P. Fax: [phone]  Franne Forts, MD Fax: [phone]  Vikki Ports A. Felicity Coyer, MD [address] [place], [place] [address]  Paulino Rily, MD [facility]  Tonye Royalty, MD Fax: [phone]  Electronically Signed by Ovidio Kin M.D. on 12/04/2010 11:51:00 AM

## 2010-12-06 LAB — CREATININE, SERUM
Creatinine, Ser: 1 mg/dL (ref 0.50–1.10)
GFR calc Af Amer: 72 mL/min — ABNORMAL LOW (ref >90)

## 2010-12-07 DIAGNOSIS — C50919 Malignant neoplasm of unspecified site of unspecified female breast: Secondary | ICD-10-CM

## 2010-12-08 LAB — CREATININE, SERUM
Creatinine, Ser: 0.86 mg/dL (ref 0.50–1.10)
GFR calc non Af Amer: 74 mL/min — ABNORMAL LOW (ref >90)

## 2010-12-13 LAB — CULTURE, BLOOD (SINGLE)

## 2010-12-14 ENCOUNTER — Telehealth (INDEPENDENT_AMBULATORY_CARE_PROVIDER_SITE_OTHER): Payer: Self-pay | Admitting: General Surgery

## 2010-12-14 NOTE — Discharge Summary (Signed)
NAMEXCARET, MORAD NO.:  [id number]  MEDICAL RECORD NO.:  [id number]  LOCATION:  1517                         FACILITY:  [facility]  PHYSICIAN:  Sharlet Salina T. 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  SURGICAL PROCEDURE:  Bilateral simple mastectomy and placement of PowerPort subcutaneous venous port via left internal jugular vein, Dr. Johna Sheriff on November 19, 2010.  HISTORY OF PRESENT ILLNESS:  The patient is a 55 year old female diagnosed with invasive cancer of the right breast in July 2011 in [place].  Biopsy revealed triple negative invasive mammary carcinoma and PET scan unfortunately revealed a liver metastasis confirmed with core biopsy.  The patient initially underwent chemotherapy in [facility] with Cytoxan and Taxotere, switched to Abraxane with good clinical response. She subsequently elected to stop treatment.  She had a Port-A-Cath that was removed due to staph infection.  She transferred her care to Dr. Cyndie Chime, is now on oral Xeloda.  Followup CT in [place] in May showed no evidence of liver disease.  I have evaluated her for initial surgical treatment options.  Dr. Cyndie Chime and I both feel that local control despite metastatic disease will be beneficial.  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  After extensive consultation in the office,  we have elected to proceed with bilateral total mastectomy.  She is admitted for this procedure.  PAST MEDICAL HISTORY:  She has a diagnosis of personality disorder, chronic pain syndrome, migraines headaches, history of Lyme disease, allergic rhinitis, iron deficiency  anemia, hyperlipidemia, GERD, type diabetes diet controlled, depression, and seizure disorder.  Also history of Addison disease and degenerative joint disease.  Previous surgery only as above.  Medications are multiple.  Please see admission H and P dictated.  ALLERGIES:  CELECOXIB, INFLIXIMAB, IOHEXOL, KETOROLAC, METHOTREXATE, NALBUPHINE, and NSAIDs.  SOCIAL HISTORY:  See detailed H and P.  FAMILY HISTORY:  See detailed H and P.  REVIEW OF SYSTEMS:  See detailed H and P.  PERTINENT PHYSICAL EXAMINATION:  GENERAL:  She is an obese Caucasian female in no acute distress. LYMPH NODES:  No palpable adenopathy. LUNGS:  Clear. BREASTS:  Large breasts bilaterally.  There is palpable mass in the lateral aspect of the right breast.  No skin changes.  HOSPITAL COURSE:  The patient was admitted on the morning of procedure. She underwent uneventful bilateral simple mastectomy and placement of Port-A-Cath via the right internal jugular vein.  Postoperatively her course was relatively smooth.  She did accidentally pull out her JP drain on the right one night and this was dressed and left out.  Pain was controlled with IV Dilaudid.  Pain control was somewhat of an issue, but this was resolved with oral OxyIR and OxyContin recommended by pharmacy.  She was discharged on November 22, 2010.  Her wounds are healing well.  Left JP is in place.  Discharge medications  are the same as admission plus OxyIR 5 to 10 mg every 4 hours p.r.n. and OxyContin 80 mg b.i.d.  Followup is to be in my office in 1 week.  Pathology is pending.     Lorne Skeens. Jillisa Harris, M.D.     Tory Emerald  D:  12/06/2010  T:  12/06/2010  Job:  [id number]

## 2010-12-14 NOTE — Telephone Encounter (Signed)
Nurse called re: patient's wound care. She needs extension to continue wound care and wanted to change orders to hydrogel so they can come out every other day. Wound care orders ended yesterday and she did not hear back yesterday. Paged Dr Johna Sheriff. He gave the okay to change orders to hydrogel every other day and to extend her wound care. They will come out Monday, Wednesday, Friday. Deanna, nurse with [facility] made aware.

## 2010-12-14 NOTE — Discharge Summary (Signed)
NAMENAVI, EWTON NO.:  [id number]  MEDICAL RECORD NO.:  [id number]  LOCATION:  1614                         FACILITY:  [facility]  PHYSICIAN:  [name] Carr, M.D.DATE OF BIRTH:  1956/01/17  DATE OF ADMISSION:  11/30/2010 DATE OF DISCHARGE:  12/08/2010                              DISCHARGE SUMMARY   DISCHARGE DIAGNOSIS:  Cellulitis bilaterally, status post bilateral mastectomy; and staph septicemia.  PROCEDURES:  None.  HISTORY OF PRESENT ILLNESS:  [name] is a 55 year old female, status post bilateral mastectomy for stage IV cancer of the breast with large tumor on the right.  She underwent bilateral simple mastectomy and Port- A-Cath placement on October 12th.  She began to feel ill a day or 2 before admission and presented to [facility] Emergency Room.  At that point, she was found to have some redness of her mastectomy incisions bilaterally, left worse than right; and [place] [address]. She was transferred here for admission and further care.  PAST MEDICAL HISTORY: 1. Stage IV breast cancer as above. 2. Systemic lupus erythematosus. 3. Psoriasis. 4. Chronic pain and narcotic dependence with back pain and other     chronic pain. 5. Depression. 6. Personality disorder. 7. Chronic migraines. 8. History of MRSA.  ADMISSION MEDICATIONS: 1. Zofran p.r.n. 2. Prednisone 5 daily. 3. EpiPen p.r.n. 4. Calcium 5. Maalox. 6. Gabapentin 300 mg t.i.d. 7. Cymbalta 60 b.i.d. 8. Trazodone 150 q.h.s. 9. Klonopin p.r.n. 10.Docusate sodium p.r.n. 11.Soma. 12.Spironolactone 25 b.i.d. 13.Dilaudid 4 mg q.4 hours p.r.n. 14.Fentanyl patch total 150 mcg q.3 days. 15.Promethazine p.r.n. 16.Metoclopramide 10 mg b.i.d. 17.Protonix 40 daily. 18.Tums.  ALLERGIES:  Penicillin, Nubain, contrast media, NSAIDs, Versed, infliximab, methotrexate, hydroxychloroquine, ketorolac.  SOCIAL HISTORY:  See detailed H and P.  FAMILY HISTORY:  See  detailed H and P.  PERTINENT PHYSICAL EXAMINATION:  VITAL SIGNS:  The patient was febrile up to as high as 102 on admission.  GENERAL:  Mildly obese, in no distress. BREASTS:  Examination of the mastectomy sites showed bilateral moderate cellulitis.  Port site looked fine. ABDOMEN:  Soft and nontender.  HOSPITAL COURSE:  The patient was admitted, started on IV vancomycin and Zosyn.  First day, white count was down to 16.9.  Second day; she did have temp to 101.4, white count was down to 11.7.  Her cellulitis continued to improve.  Her regular medications were all continued.  By October 25, her white count was normal at 7.3.  Blood cultures from [facility] did return showing Gram-positive cocci.  Dr. __________ saw the patient, and we both felt that her Port-A-Cath to be salvaged as there was no evidence of infection around the port.  She would have required 3 weeks of IV vancomycin.  The patient continued to improve overall, although she developed a persistent area of induration and redness in the lateral aspect of the left wound only.  At about the fifth day, I took several staples out here and opened a small portion of this wound and drained a small to moderate amount of pus from beneath the flaps. She continued with saline packing of this area daily.  This area continued to steadily  improve.  She was felt ready for discharge on October 31.  She has minimal erythema around the 2 cm open area for lateral left mastectomy site and we will have moist saline gauze packing done daily by home health.  We will continue IV vancomycin for 2 more weeks via Port-A-Cath by home health.  Follow up in my office in 1 week. Discharge medications are the same as admission.     [name] Carr, M.D.     Tory Emerald  D:  12/08/2010  T:  12/08/2010  Job:  [id number]  Electronically Signed by Glenna Fellows M.D. on 12/14/2010 12:10:17 PM

## 2010-12-17 ENCOUNTER — Ambulatory Visit (INDEPENDENT_AMBULATORY_CARE_PROVIDER_SITE_OTHER): Payer: Medicare Other | Admitting: General Surgery

## 2010-12-17 ENCOUNTER — Encounter (INDEPENDENT_AMBULATORY_CARE_PROVIDER_SITE_OTHER): Payer: Self-pay | Admitting: General Surgery

## 2010-12-17 VITALS — BP 138/88 | HR 64 | Temp 96.8°F | Resp 20 | Ht 65.75 in | Wt 190.5 lb

## 2010-12-17 DIAGNOSIS — T8140XA Infection following a procedure, unspecified, initial encounter: Secondary | ICD-10-CM

## 2010-12-17 NOTE — Progress Notes (Signed)
History: Patient returns to the office with a history of bilateral total mastectomy approximately 4 weeks ago complicated by postoperative bilateral wound cellulitis and a superficial abscess laterally in the left wound and positive blood cultures for staph. The [place] [facility] I received a report of gram-positive cocci in clusters on her blood culture but I requested it did not obtain the sensitivity results. The patient was hospitalized on IV vancomycin for over a week in the hospital with marked improvement. She had a Port-A-Cath placed the time of her mastectomy but there is never been any cellulitis around the port. She is now on IV vancomycin at home. She reports she is generally feeling better. She reports a low-grade fever but nothing over 100. She has developed an area of redness and tenderness in her left anterior thigh where she states she had a heparin injection while in the hospital.  Exam: Generally in no distress. The mastectomy wounds are much improved with really no evidence of infection. I removed half the staples. There is a small open area lateral to the left pack which appears clean and this was repacked. There is a moderate seroma on the right side and I aspirated 80 cc of serous fluid.On the left anterior thigh is a 2 cm area of induration and redness with some slight central fluctuance.  After discussing the procedure and local anesthesia did a small I&D of her left anterior thigh and obtained a clot consistent with sterile hematoma. A small iodoform wick was placed which she will remove tomorrow.  Assessment and plan: Overall improving status post wound cellulitis and gram-positive septicemia. She will be on her IV vancomycin another week. I will see her at that time and remove the remainder of her staples.

## 2010-12-17 NOTE — Patient Instructions (Signed)
Continue mastectomy wound care as you are doing. Removed the small gauze packing from the leg tomorrow and keep covered with a Band-Aid.

## 2010-12-18 ENCOUNTER — Other Ambulatory Visit: Payer: Self-pay | Admitting: Internal Medicine

## 2010-12-19 ENCOUNTER — Encounter (HOSPITAL_COMMUNITY): Payer: Self-pay

## 2010-12-19 ENCOUNTER — Inpatient Hospital Stay (HOSPITAL_COMMUNITY)
Admission: EM | Admit: 2010-12-19 | Discharge: 2011-01-03 | DRG: 314 | Disposition: A | Discharge status: Home Health | Payer: Medicare Other | Attending: Internal Medicine | Admitting: Internal Medicine

## 2010-12-19 DIAGNOSIS — R609 Edema, unspecified: Secondary | ICD-10-CM | POA: Diagnosis not present

## 2010-12-19 DIAGNOSIS — R600 Localized edema: Secondary | ICD-10-CM

## 2010-12-19 DIAGNOSIS — F3289 Other specified depressive episodes: Secondary | ICD-10-CM | POA: Diagnosis present

## 2010-12-19 DIAGNOSIS — IMO0002 Reserved for concepts with insufficient information to code with codable children: Secondary | ICD-10-CM

## 2010-12-19 DIAGNOSIS — Z8614 Personal history of Methicillin resistant Staphylococcus aureus infection: Secondary | ICD-10-CM

## 2010-12-19 DIAGNOSIS — I82C19 Acute embolism and thrombosis of unspecified internal jugular vein: Secondary | ICD-10-CM | POA: Diagnosis not present

## 2010-12-19 DIAGNOSIS — R509 Fever, unspecified: Secondary | ICD-10-CM

## 2010-12-19 DIAGNOSIS — T80211A Bloodstream infection due to central venous catheter, initial encounter: Principal | ICD-10-CM | POA: Diagnosis present

## 2010-12-19 DIAGNOSIS — E876 Hypokalemia: Secondary | ICD-10-CM

## 2010-12-19 DIAGNOSIS — A419 Sepsis, unspecified organism: Secondary | ICD-10-CM | POA: Diagnosis present

## 2010-12-19 DIAGNOSIS — D62 Acute posthemorrhagic anemia: Secondary | ICD-10-CM | POA: Diagnosis not present

## 2010-12-19 DIAGNOSIS — C50919 Malignant neoplasm of unspecified site of unspecified female breast: Secondary | ICD-10-CM | POA: Diagnosis present

## 2010-12-19 DIAGNOSIS — M542 Cervicalgia: Secondary | ICD-10-CM | POA: Diagnosis not present

## 2010-12-19 DIAGNOSIS — J309 Allergic rhinitis, unspecified: Secondary | ICD-10-CM | POA: Diagnosis present

## 2010-12-19 DIAGNOSIS — I82409 Acute embolism and thrombosis of unspecified deep veins of unspecified lower extremity: Secondary | ICD-10-CM | POA: Diagnosis not present

## 2010-12-19 DIAGNOSIS — K5900 Constipation, unspecified: Secondary | ICD-10-CM | POA: Diagnosis not present

## 2010-12-19 DIAGNOSIS — F329 Major depressive disorder, single episode, unspecified: Secondary | ICD-10-CM | POA: Diagnosis present

## 2010-12-19 DIAGNOSIS — R109 Unspecified abdominal pain: Secondary | ICD-10-CM

## 2010-12-19 DIAGNOSIS — F609 Personality disorder, unspecified: Secondary | ICD-10-CM | POA: Diagnosis present

## 2010-12-19 DIAGNOSIS — L409 Psoriasis, unspecified: Secondary | ICD-10-CM

## 2010-12-19 DIAGNOSIS — E119 Type 2 diabetes mellitus without complications: Secondary | ICD-10-CM | POA: Diagnosis present

## 2010-12-19 DIAGNOSIS — D649 Anemia, unspecified: Secondary | ICD-10-CM | POA: Diagnosis present

## 2010-12-19 DIAGNOSIS — Z801 Family history of malignant neoplasm of trachea, bronchus and lung: Secondary | ICD-10-CM

## 2010-12-19 DIAGNOSIS — R040 Epistaxis: Secondary | ICD-10-CM | POA: Diagnosis not present

## 2010-12-19 DIAGNOSIS — Y849 Medical procedure, unspecified as the cause of abnormal reaction of the patient, or of later complication, without mention of misadventure at the time of the procedure: Secondary | ICD-10-CM | POA: Diagnosis present

## 2010-12-19 DIAGNOSIS — R4182 Altered mental status, unspecified: Secondary | ICD-10-CM | POA: Diagnosis present

## 2010-12-19 DIAGNOSIS — M1990 Unspecified osteoarthritis, unspecified site: Secondary | ICD-10-CM | POA: Diagnosis present

## 2010-12-19 DIAGNOSIS — A4189 Other specified sepsis: Secondary | ICD-10-CM | POA: Diagnosis present

## 2010-12-19 DIAGNOSIS — M329 Systemic lupus erythematosus, unspecified: Secondary | ICD-10-CM | POA: Diagnosis present

## 2010-12-19 DIAGNOSIS — E669 Obesity, unspecified: Secondary | ICD-10-CM | POA: Diagnosis present

## 2010-12-19 DIAGNOSIS — L02219 Cutaneous abscess of trunk, unspecified: Secondary | ICD-10-CM | POA: Diagnosis present

## 2010-12-19 DIAGNOSIS — E2749 Other adrenocortical insufficiency: Secondary | ICD-10-CM | POA: Diagnosis present

## 2010-12-19 DIAGNOSIS — R112 Nausea with vomiting, unspecified: Secondary | ICD-10-CM

## 2010-12-19 DIAGNOSIS — L408 Other psoriasis: Secondary | ICD-10-CM | POA: Diagnosis present

## 2010-12-19 DIAGNOSIS — G894 Chronic pain syndrome: Secondary | ICD-10-CM | POA: Diagnosis present

## 2010-12-19 DIAGNOSIS — G40909 Epilepsy, unspecified, not intractable, without status epilepticus: Secondary | ICD-10-CM | POA: Diagnosis present

## 2010-12-19 DIAGNOSIS — K219 Gastro-esophageal reflux disease without esophagitis: Secondary | ICD-10-CM | POA: Diagnosis present

## 2010-12-19 DIAGNOSIS — E785 Hyperlipidemia, unspecified: Secondary | ICD-10-CM | POA: Diagnosis present

## 2010-12-19 DIAGNOSIS — T80219A Unspecified infection due to central venous catheter, initial encounter: Secondary | ICD-10-CM | POA: Diagnosis present

## 2010-12-19 DIAGNOSIS — Z91041 Radiographic dye allergy status: Secondary | ICD-10-CM

## 2010-12-19 DIAGNOSIS — Z9010 Acquired absence of unspecified breast and nipple: Secondary | ICD-10-CM

## 2010-12-19 DIAGNOSIS — J3489 Other specified disorders of nose and nasal sinuses: Secondary | ICD-10-CM | POA: Diagnosis not present

## 2010-12-19 DIAGNOSIS — G43909 Migraine, unspecified, not intractable, without status migrainosus: Secondary | ICD-10-CM | POA: Diagnosis present

## 2010-12-19 DIAGNOSIS — C787 Secondary malignant neoplasm of liver and intrahepatic bile duct: Secondary | ICD-10-CM | POA: Diagnosis present

## 2010-12-19 DIAGNOSIS — K30 Functional dyspepsia: Secondary | ICD-10-CM

## 2010-12-19 DIAGNOSIS — Z79899 Other long term (current) drug therapy: Secondary | ICD-10-CM

## 2010-12-19 LAB — URINALYSIS, ROUTINE W REFLEX MICROSCOPIC
Leukocytes, UA: NEGATIVE
Nitrite: NEGATIVE
Specific Gravity, Urine: 1.003 — ABNORMAL LOW (ref 1.005–1.030)
pH: 6 (ref 5.0–8.0)

## 2010-12-19 MED ORDER — PIPERACILLIN-TAZOBACTAM 3.375 G IVPB: 3.3750 g | Freq: Three times a day (TID) | INTRAVENOUS | Status: DC

## 2010-12-19 NOTE — ED Notes (Signed)
Pt rec'd 2 runs of KCL for Potassium 2.5.  Second is running now. Also 60 meq KCL po also completed.  Also rec'd zosyn 4.5g, Dilaudid 1mg  and zofran 4mg . Tylenol 1 gm po. Pt arrived with xray report from [facility].

## 2010-12-19 NOTE — ED Notes (Signed)
Pt has portacath IV in place from [place].  Pt had site accessed prior to arrival for home meds.

## 2010-12-19 NOTE — ED Provider Notes (Signed)
History     CSN: [id number] Arrival date & time: 12/19/2010  7:58 PM   First MD Initiated Contact with Patient 12/19/10 2044      Chief Complaint  Patient presents with  . Altered Mental Status   Patient is a 55 y.o. female presenting with altered mental status.  Altered Mental Status   history provided by EMS in previous records. Patient transferred from [facility] for further evaluation of fever and altered mental status. Patient has significant history for stage IV breast cancer and recent bilateral mastectomy last month that was complicated with an wound cellulitis and abscess. Patient arrived to [facility] regional earlier today with complaints of headache dizziness and feeling sick. On presentation the patient had a temperature of 102.1 orally blood pressure 155/58 heart rate of 104 and harsh and saturation of 94% room air. Patient had labs significant for mild WBC elevation of 11.5 hypokalemia with 2.5. Patient had a normal-appearing urine sample and an unchanged chest x-ray with left Port-A-Cath in normal position.  The on-call oncology specialist was notified and patient was recommended for transfer to list on emergency room for admission by the hospitalist for continued evaluation of fever in a cancer patient with unknown origin. Patient was given Zosyn and 2 runs of potassium at [facility].  Past Medical History  Diagnosis Date  . PERSONALITY DISORDER   . Chronic pain syndrome     chronic narcotics, dependancy hx - [place] for pain mgmt  . MIGRAINE HEADACHE   . Lyme disease   . ALLERGIC RHINITIS   . ANEMIA-IRON DEFICIENCY   . HYPERLIPIDEMIA   . GERD   . DIABETES MELLITUS, TYPE II   . DEPRESSION   . SEIZURE DISORDER   . CARCINOMA, BREAST 08/2009 dx    R breast, reports mets to liver  . SLE (systemic lupus erythematosus)     r[facility]um [facility] - chronic pred  . Psoriasis   . Addison's disease   . Metastases to the liver   . Degenerative joint disease   .  Migraine headache   . DDD (degenerative disc disease)     Past Surgical History  Procedure Date  . Breast surgery     breast biopsy  . Liver bopsy   . Port a cath placement   . Port-a-cath removal   . Mastectomy 11/19/10    bilateral mastectomy     Family History  Problem Relation Age of Onset  . Lung cancer Mother   . Lung cancer Father   . Arthritis Other   . Diabetes Other   . Hyperlipidemia Other     History  Substance Use Topics  . Smoking status: Never Smoker   . Smokeless tobacco: Never Used  . Alcohol Use: No    OB History    Grav Para Term Preterm Abortions TAB SAB Ect Mult Living                  Review of Systems  Unable to perform ROS Psychiatric/Behavioral: Positive for altered mental status.    Allergies  Celecoxib; Infliximab; Iohexol; Ketorolac tromethamine; Methotrexate; Nalbuphine; and Nsaids  Home Medications   Current Outpatient Rx  Name Route Sig Dispense Refill  . CARISOPRODOL 350 MG PO TABS  TAKE 1 TABLET BY MOUTH FOUR TIMES DAILY AS NEEDED 120 tablet 0  . CEPHALEXIN 500 MG PO CAPS Oral Take 1 capsule (500 mg total) by mouth 4 (four) times daily. 20 capsule 0  . CLOBETASOL PROPIONATE  0.05 % EX OINT Topical Apply topically 2 (two) times daily. 30 g 0  . CLONAZEPAM 1 MG PO TABS Oral Take 1 tablet (1 mg total) by mouth 2 (two) times daily as needed. 60 tablet 6  . DOCUSATE SODIUM 100 MG PO CAPS Oral Take 100 mg by mouth 2 (two) times daily.      . DULOXETINE HCL 60 MG PO CPEP Oral Take 1 capsule (60 mg total) by mouth 2 (two) times daily. 60 capsule 6  . EPINEPHRINE 0.3 MG/0.3ML IJ DEVI Intramuscular Inject 0.3 mLs (0.3 mg total) into the muscle once. 1 Device 1  . FENTANYL 75 MCG/HR TD PT72 Transdermal Place 2 patches onto the skin every 3 (three) days.      . FUROSEMIDE 20 MG PO TABS Oral Take 1 tablet (20 mg total) by mouth daily. 30 tablet 3  . GABAPENTIN 300 MG PO CAPS Oral Take 3 capsules (900 mg total) by mouth 3 (three) times  daily. 270 capsule 3  . HYDROCHLOROTHIAZIDE 25 MG PO TABS  TAKE 1 TABLET BY MOUTH EVERY DAY 30 tablet 5  . HYDROMORPHONE HCL 2 MG PO TABS Oral Take 4 mg by mouth every 4 (four) hours as needed.     Marland Kitchen METOCLOPRAMIDE HCL 5 MG PO TABS Oral Take 2 tablets (10 mg total) by mouth 2 (two) times daily. 60 tablet 1  . OMEPRAZOLE 40 MG PO CPDR Oral Take 40 mg by mouth every morning.      Marland Kitchen POTASSIUM CHLORIDE CRYS CR 20 MEQ PO TBCR Oral Take 1 tablet (20 mEq total) by mouth 2 (two) times daily. Or as directed 60 tablet 3  . PREDNISONE 10 MG PO TABS Oral Take 1 tablet (10 mg total) by mouth daily. 30 tablet 3  . PROBIOTIC FORMULA PO CAPS Oral Take by mouth daily.      Marland Kitchen PROMETHAZINE HCL 25 MG RE SUPP Rectal Place 1 suppository (25 mg total) rectally every 4 (four) hours as needed for nausea. 12 each 0  . PROMETHAZINE HCL 25 MG PO TABS  TAKE 1 TABLET BY MOUTH EVERY 4 HOURS AS NEEDED FOR NAUSEA AND VOMITING 40 tablet 0  . SPIRONOLACTONE-HCTZ 25-25 MG PO TABS Oral Take 1 tablet by mouth 2 (two) times daily. 60 tablet 5  . TRAZODONE HCL 150 MG PO TABS Oral Take 150 mg by mouth at bedtime.        BP 146/72  Pulse 85  Temp(Src) 99.4 F (37.4 C) (Oral)  Resp 17  SpO2 100%  Physical Exam  Constitutional: She appears well-developed and well-nourished.  HENT:  Head: Normocephalic and atraumatic.  Eyes: EOM are normal. Pupils are equal, round, and reactive to light.  Cardiovascular: Normal rate.   No murmur heard. Pulmonary/Chest: Effort normal and breath sounds normal. She has no rales.  Abdominal: Soft. Normal appearance. There is tenderness in the right lower quadrant. There is tenderness at McBurney's point. There is no rebound, no guarding and negative Murphy's sign.  Musculoskeletal: She exhibits no edema and no tenderness.       Movement all extremities. No deformity, swelling, erythema.  Neurological: She is alert.       Patient with altered mental status awake and alert but unaware of  surroundings. Patient follows simple commands and answers questions normally. She appears drowsy.  Skin: Skin is warm.       Bilateral healing mastectomy incisions with staples in place. Incision clean dry and intact. Skin with no erythema or induration.  Left chest Port-A-Cath in place with no erythema or skin. The 5 mm healing incision over anterior left thigh with mild skin induration from likely recent I&D.    ED Course  Procedures (including critical care time)  1. Fever   2. Altered mental status     MDM  Patient seen and evaluated.  Spoke with Dr. hospitalist they will see patient and admit.      Angus Seller, [place] 12/19/10 2141

## 2010-12-19 NOTE — ED Notes (Signed)
JXB:[id number]<GN> Expected date:12/19/10<BR> Expected time:<BR> Means of arrival:<BR> Comments:<BR> Transfer from [facility]- [facility] - cancer pt - bil mastectomy - port-o-cath accessed - K 2.5, WBC, 11.5, temp 102, blood culture

## 2010-12-19 NOTE — H&P (Signed)
PCP:   Rene Paci, MD, MD  Oncology: Dr. Jonny Ruiz Surgeon: Dr. Glenna Fellows  Chief Complaint:  Fever  HPI: This is an unfortunate 55 year old female, whose had breast cancer since 2011. She's undergone incomplete chemotherapy and more recently bilateral mastectomies on October 12. She subsequently developed a staph infection and sepsis from her surgical wound site. She was discharged home in IV antibiotic - vancomycin. Yesterday she noted she could not think straight, she was confused and disoriented. She checked her temperature it was 103.8. She reports no nausea no vomiting. No increased drainage from her surgical site. She reports some shortness of breath and chills. She reports no wheeze, no burning on urination. She had missed a single dose of vancomycin yesterday morning because she woke up late. Because of her temperature she called 911 she was brought to the [facility], her oncologist was contacted he recommended transfer to [facility] with admission to the hospitalist service for treatment of sepsis in an immunocompromised patient. History obtained from patient who appears reliable.  Review of Systems: [Positive bolded] The patient denies anorexia, fever, weight loss,, vision loss, decreased hearing, hoarseness, chest pain, syncope, dyspnea on exertion, peripheral edema, balance deficits, hemoptysis, abdominal pain, melena, hematochezia, severe indigestion/heartburn, hematuria, incontinence, genital sores, muscle weakness, suspicious skin lesions, transient blindness, difficulty walking, depression, unusual weight change, abnormal bleeding, enlarged lymph nodes, angioedema, and breast masses.  Past Medical History: Past Medical History  Diagnosis Date  . PERSONALITY DISORDER   . Chronic pain syndrome     chronic narcotics, dependancy hx - [place] for pain mgmt  . MIGRAINE HEADACHE   . Lyme disease   . ALLERGIC RHINITIS   . ANEMIA-IRON DEFICIENCY   . HYPERLIPIDEMIA   . GERD    . DIABETES MELLITUS, TYPE II   . DEPRESSION   . SEIZURE DISORDER   . CARCINOMA, BREAST 08/2009 dx    R breast, reports mets to liver  . SLE (systemic lupus erythematosus)     [place] ([place]) - chronic pred  . Psoriasis   . Addison's disease   . Metastases to the liver   . Degenerative joint disease   . Migraine headache   . DDD (degenerative disc disease)    Past Surgical History  Procedure Date  . Breast surgery     breast biopsy  . Liver bopsy   . Port a cath placement   . Port-a-cath removal   . Mastectomy 11/19/10    bilateral mastectomy     Medications: Prior to Admission medications   Medication Sig Start Date End Date Taking? Authorizing Provider  carisoprodol (SOMA) 350 MG tablet TAKE 1 TABLET BY MOUTH FOUR TIMES DAILY AS NEEDED 10/13/10   Rene Paci, MD  cephALEXin (KEFLEX) 500 MG capsule Take 1 capsule (500 mg total) by mouth 4 (four) times daily. 10/13/10 10/23/10  Charles B. Bernette Mayers, MD  clobetasol (TEMOVATE) 0.05 % ointment Apply topically 2 (two) times daily. 07/14/10 07/14/11  Rene Paci, MD  clonazePAM (KLONOPIN) 1 MG tablet Take 1 tablet (1 mg total) by mouth 2 (two) times daily as needed. 07/14/10   Rene Paci, MD  docusate sodium (COLACE) 100 MG capsule Take 100 mg by mouth 2 (two) times daily.      Historical Provider, MD  DULoxetine (CYMBALTA) 60 MG capsule Take 1 capsule (60 mg total) by mouth 2 (two) times daily. 10/20/10   Rene Paci, MD  EPINEPHrine (EPIPEN) 0.3 mg/0.3 mL DEVI Inject 0.3 mLs (0.3 mg total) into the  muscle once. 07/14/10   Rene Paci, MD  fentaNYL (DURAGESIC - DOSED MCG/HR) 75 MCG/HR Place 2 patches onto the skin every 3 (three) days.      Historical Provider, MD  furosemide (LASIX) 20 MG tablet Take 1 tablet (20 mg total) by mouth daily. 10/20/10 10/20/11  Rene Paci, MD  gabapentin (NEURONTIN) 300 MG capsule Take 3 capsules (900 mg total) by mouth 3 (three) times daily. 09/13/10   Rene Paci, MD    hydrochlorothiazide (HYDRODIURIL) 25 MG tablet TAKE 1 TABLET BY MOUTH EVERY DAY 11/26/10   Rene Paci, MD  HYDROmorphone (DILAUDID) 2 MG tablet Take 4 mg by mouth every 4 (four) hours as needed.     Historical Provider, MD  metoCLOPramide (REGLAN) 5 MG tablet Take 2 tablets (10 mg total) by mouth 2 (two) times daily. 08/09/10   Rene Paci, MD  omeprazole (PRILOSEC) 40 MG capsule Take 40 mg by mouth every morning.      Historical Provider, MD  potassium chloride SA (K-DUR,KLOR-CON) 20 MEQ tablet Take 1 tablet (20 mEq total) by mouth 2 (two) times daily. Or as directed 10/01/10   Rene Paci, MD  predniSONE (DELTASONE) 10 MG tablet Take 1 tablet (10 mg total) by mouth daily. 09/13/10   Rene Paci, MD  Probiotic Product (PROBIOTIC FORMULA) CAPS Take by mouth daily.      Historical Provider, MD  promethazine (PHENERGAN) 25 MG suppository Place 1 suppository (25 mg total) rectally every 4 (four) hours as needed for nausea. 08/09/10 08/16/10  Rene Paci, MD  promethazine (PHENERGAN) 25 MG tablet TAKE 1 TABLET BY MOUTH EVERY 4 HOURS AS NEEDED FOR NAUSEA AND VOMITING 06/03/10   Rene Paci, MD  spironolactone-hydrochlorothiazide (ALDACTAZIDE) 25-25 MG per tablet Take 1 tablet by mouth 2 (two) times daily. 11/02/10   Rene Paci, MD  traZODone (DESYREL) 150 MG tablet Take 150 mg by mouth at bedtime.      Historical Provider, MD    Allergies:   Allergies  Allergen Reactions  . Celecoxib   . Infliximab   . Iohexol      Code: SOB   . Ketorolac Tromethamine     REACTION: but pt states not injectable  . Methotrexate   . Nalbuphine   . Nsaids     Social History:  reports that she has never smoked. She has never used smokeless tobacco. She reports that she does not drink alcohol or use illicit drugs. patient lives alone. Visiting home nurse comes every other day for dressing change. From advanced nursing. Patient uses a wheelchair, walker and cane. She is a fall risk. No  home oxygen.  Family History: Family History  Problem Relation Age of Onset  . Lung cancer Mother   . Lung cancer Father   . Arthritis Other   . Diabetes Other   . Hyperlipidemia Other     Physical Exam: Filed Vitals:   12/19/10 1948 12/19/10 2008  BP: 139/72 146/72  Pulse: 86 85  Temp:  99.4 F (37.4 C)  TempSrc:  Oral  Resp: 16 17  SpO2: 97% 100%    General:  Alert and oriented times three, well developed and nourished, ill-appearing female, weak Eyes: PERRLA, pink conjunctiva, no scleral icterus, dilated pupils ENT: Dry oral mucosa, neck supple, no thyromegaly Lungs: clear to ascultation, no wheeze, no crackles, no use of accessory muscles Chest: Bilateral mastectomy scars, staples in place. Right breast surgical scar with drainage and dressing. Mediport left chest wall. No cellulitis, no erythema. Mediport doesn't  appear infected. Cardiovascular: regular rate and rhythm, no regurgitation, no gallops, no murmurs. No carotid bruits, no JVD Abdomen: soft, positive BS, non-tender, non-distended, no organomegaly, not an acute abdomen GU: not examined Neuro: CN II - XII grossly intact, decreased sensation bilateral lower extremity Musculoskeletal: strength 5/5 all extremities, no clubbing, cyanosis or edema, myotonic jerks Skin: no rash, no subcutaneous crepitation, no decubitus Psych: appropriate patient, generally weak patient   Labs on Admission: Erect [done at [place]] UA negative no ketones, no nitrates, no leukocytes esterase CBC: White blood count 11.5, hemoglobin 10.7, platelets 180 CMP: Sodium 128, potassium 2.5, chloride 86, CO2 28, glucose 159, BUN 11, creatinine 1.03, LFTs are okay.  Radiological Exams on Admission: Chest x-ray: No changes, no acute findings.  Assessment/Plan Present on Admission:  .Sepsis syndrome .Immunocompromised patient admit patient Blood cultures x 3, including through medi-port. IV antibiotics, continue vancomycin, add  zosyn Patient's prednisone will be held  Electrolyte imbalance Check urine sodium and osmolarity. Check plasma osmolarity Normal saline IV, with potassium Check magnesium level  Likely due to diuretics, these have been discontinued Repeat BMP in a.m.  Marland KitchenCARCINOMA, BREAST Consults oncology Dr. Jonny Ruiz in a.m.  S/p B/L Mastectomy Done by Dr. Johna Sheriff, consult in a.m. Consults wound care for dressing change Will obtain wound culture .DIABETES MELLITUS, TYPE II .HYPERLIPIDEMIA .PERSONALITY DISORDER .MIGRAINE HEADACHE .Chronic pain syndrome .GERD Stable Resume home meds    CODE STATUS: Full code  DVT/GI prophylaxis Team 2/Dr. Lorel Monaco, Larayne Baxley 12/19/2010, 10:48 PM

## 2010-12-20 LAB — BASIC METABOLIC PANEL
CO2: 31 mEq/L (ref 19–32)
Calcium: 7.9 mg/dL — ABNORMAL LOW (ref 8.4–10.5)
Creatinine, Ser: 1.01 mg/dL (ref 0.50–1.10)
GFR calc non Af Amer: 61 mL/min — ABNORMAL LOW (ref >90)

## 2010-12-20 LAB — CBC
MCH: 25.3 pg — ABNORMAL LOW (ref 26.0–34.0)
MCV: 82 fL (ref 78.0–100.0)
Platelets: 164 10*3/uL (ref 150–400)
RDW: 15.8 % — ABNORMAL HIGH (ref 11.5–15.5)
WBC: 6.8 10*3/uL (ref 4.0–10.5)

## 2010-12-20 LAB — MRSA PCR SCREENING: MRSA by PCR: NEGATIVE

## 2010-12-20 LAB — OSMOLALITY: Osmolality: 263 mOsm/kg — ABNORMAL LOW (ref 275–300)

## 2010-12-20 LAB — SODIUM, URINE, RANDOM: Sodium, Ur: 41 mEq/L

## 2010-12-20 LAB — MAGNESIUM: Magnesium: 1.3 mg/dL — ABNORMAL LOW (ref 1.5–2.5)

## 2010-12-20 MED ORDER — DOCUSATE SODIUM 100 MG PO CAPS
100.0000 mg | ORAL_CAPSULE | Freq: Two times a day (BID) | ORAL | Status: DC
  Administered 2010-12-20 – 2011-01-03 (×27): 100 mg via ORAL
  Filled 2010-12-20 (×31): qty 1

## 2010-12-20 MED ORDER — DULOXETINE HCL 60 MG PO CPEP
60.0000 mg | ORAL_CAPSULE | Freq: Two times a day (BID) | ORAL | Status: DC
  Administered 2010-12-20 – 2011-01-03 (×28): 60 mg via ORAL
  Filled 2010-12-20 (×33): qty 1

## 2010-12-20 MED ORDER — ONDANSETRON HCL 4 MG PO TABS
4.0000 mg | ORAL_TABLET | Freq: Four times a day (QID) | ORAL | Status: DC | PRN
  Filled 2010-12-20: qty 1

## 2010-12-20 MED ORDER — ACETAMINOPHEN 650 MG RE SUPP
650.0000 mg | Freq: Four times a day (QID) | RECTAL | Status: DC | PRN
  Filled 2010-12-20: qty 1

## 2010-12-20 MED ORDER — ONDANSETRON HCL 4 MG/2ML IJ SOLN
4.0000 mg | Freq: Four times a day (QID) | INTRAMUSCULAR | Status: DC | PRN
  Administered 2010-12-20 – 2011-01-02 (×29): 4 mg via INTRAVENOUS
  Filled 2010-12-20 (×31): qty 2

## 2010-12-20 MED ORDER — HYDROMORPHONE HCL 4 MG PO TABS
ORAL_TABLET | ORAL | Status: AC
  Administered 2010-12-20: 4 mg via ORAL
  Filled 2010-12-20: qty 1

## 2010-12-20 MED ORDER — PROMETHAZINE HCL 25 MG PO TABS
12.5000 mg | ORAL_TABLET | Freq: Four times a day (QID) | ORAL | Status: DC | PRN
  Filled 2010-12-20: qty 1

## 2010-12-20 MED ORDER — METOCLOPRAMIDE HCL 10 MG PO TABS
10.0000 mg | ORAL_TABLET | Freq: Two times a day (BID) | ORAL | Status: DC
  Administered 2010-12-20 – 2011-01-03 (×17): 10 mg via ORAL
  Filled 2010-12-20 (×34): qty 1

## 2010-12-20 MED ORDER — PROMETHAZINE HCL 25 MG/ML IJ SOLN
12.5000 mg | Freq: Four times a day (QID) | INTRAMUSCULAR | Status: DC | PRN
  Administered 2010-12-20 – 2010-12-26 (×16): 12.5 mg via INTRAVENOUS
  Filled 2010-12-20 (×16): qty 1

## 2010-12-20 MED ORDER — CLONAZEPAM 1 MG PO TABS
1.0000 mg | ORAL_TABLET | Freq: Two times a day (BID) | ORAL | Status: DC | PRN
  Administered 2010-12-20 – 2010-12-29 (×9): 1 mg via ORAL
  Filled 2010-12-20 (×9): qty 1

## 2010-12-20 MED ORDER — TRAZODONE HCL 150 MG PO TABS
150.0000 mg | ORAL_TABLET | Freq: Every day | ORAL | Status: DC
  Administered 2010-12-21 – 2011-01-02 (×11): 150 mg via ORAL
  Filled 2010-12-20 (×16): qty 1

## 2010-12-20 MED ORDER — VANCOMYCIN HCL IN DEXTROSE 1-5 GM/200ML-% IV SOLN
1000.0000 mg | Freq: Three times a day (TID) | INTRAVENOUS | Status: DC
  Administered 2010-12-20: 1000 mg via INTRAVENOUS
  Filled 2010-12-20 (×3): qty 200

## 2010-12-20 MED ORDER — VANCOMYCIN HCL IN DEXTROSE 1-5 GM/200ML-% IV SOLN
1000.0000 mg | Freq: Two times a day (BID) | INTRAVENOUS | Status: DC
  Administered 2010-12-20 – 2010-12-22 (×5): 1000 mg via INTRAVENOUS
  Filled 2010-12-20 (×6): qty 200

## 2010-12-20 MED ORDER — HYDROMORPHONE HCL 4 MG PO TABS
4.0000 mg | ORAL_TABLET | ORAL | Status: DC | PRN
  Administered 2010-12-20: 4 mg via ORAL
  Filled 2010-12-20: qty 1

## 2010-12-20 MED ORDER — CARISOPRODOL 350 MG PO TABS
350.0000 mg | ORAL_TABLET | Freq: Three times a day (TID) | ORAL | Status: DC | PRN
Start: 2010-12-20 — End: 2011-01-03

## 2010-12-20 MED ORDER — ACETAMINOPHEN 325 MG PO TABS
650.0000 mg | ORAL_TABLET | Freq: Four times a day (QID) | ORAL | Status: DC | PRN
  Administered 2010-12-20 – 2011-01-02 (×3): 650 mg via ORAL
  Filled 2010-12-20 (×3): qty 2

## 2010-12-20 MED ORDER — POTASSIUM CHLORIDE 10 MEQ/100ML IV SOLN
10.0000 meq | INTRAVENOUS | Status: AC
  Administered 2010-12-20 (×2): 10 meq via INTRAVENOUS
  Filled 2010-12-20 (×4): qty 100

## 2010-12-20 MED ORDER — GABAPENTIN 300 MG PO CAPS
900.0000 mg | ORAL_CAPSULE | Freq: Three times a day (TID) | ORAL | Status: DC
  Administered 2010-12-20 – 2011-01-03 (×42): 900 mg via ORAL
  Filled 2010-12-20 (×47): qty 3

## 2010-12-20 MED ORDER — POTASSIUM CHLORIDE CRYS ER 20 MEQ PO TBCR
20.0000 meq | EXTENDED_RELEASE_TABLET | ORAL | Status: AC
  Administered 2010-12-20: 20 meq via ORAL
  Filled 2010-12-20 (×3): qty 1

## 2010-12-20 MED ORDER — POTASSIUM CHLORIDE IN NACL 20-0.9 MEQ/L-% IV SOLN
INTRAVENOUS | Status: DC
  Administered 2010-12-20 – 2010-12-24 (×7): via INTRAVENOUS
  Administered 2010-12-26: 1000 mL via INTRAVENOUS
  Administered 2010-12-28: 19:00:00 via INTRAVENOUS
  Administered 2010-12-31: 20 mL/h via INTRAVENOUS
  Administered 2011-01-02: 04:00:00 via INTRAVENOUS
  Filled 2010-12-20 (×16): qty 1000

## 2010-12-20 MED ORDER — PANTOPRAZOLE SODIUM 40 MG PO TBEC
40.0000 mg | DELAYED_RELEASE_TABLET | Freq: Every day | ORAL | Status: DC
  Administered 2010-12-20 – 2011-01-03 (×14): 40 mg via ORAL
  Filled 2010-12-20 (×16): qty 1

## 2010-12-20 MED ORDER — POTASSIUM CHLORIDE CRYS ER 20 MEQ PO TBCR
20.0000 meq | EXTENDED_RELEASE_TABLET | Freq: Two times a day (BID) | ORAL | Status: DC
  Administered 2010-12-20 – 2011-01-03 (×28): 20 meq via ORAL
  Filled 2010-12-20 (×33): qty 1

## 2010-12-20 MED ORDER — ENOXAPARIN SODIUM 40 MG/0.4ML ~~LOC~~ SOLN
40.0000 mg | SUBCUTANEOUS | Status: DC
  Administered 2010-12-20 – 2010-12-22 (×3): 40 mg via SUBCUTANEOUS
  Filled 2010-12-20 (×4): qty 0.4

## 2010-12-20 MED ORDER — HYDROCODONE-ACETAMINOPHEN 5-325 MG PO TABS
1.0000 | ORAL_TABLET | ORAL | Status: DC | PRN
  Administered 2010-12-20 – 2010-12-24 (×2): 2 via ORAL
  Filled 2010-12-20 (×2): qty 2

## 2010-12-20 MED ORDER — FENTANYL 75 MCG/HR TD PT72
150.0000 ug | MEDICATED_PATCH | TRANSDERMAL | Status: DC
  Administered 2010-12-20 – 2010-12-26 (×3): 150 ug via TRANSDERMAL
  Filled 2010-12-20 (×3): qty 2

## 2010-12-20 MED ORDER — PIPERACILLIN-TAZOBACTAM 3.375 G IVPB
3.3750 g | Freq: Three times a day (TID) | INTRAVENOUS | Status: DC
  Administered 2010-12-20 – 2010-12-22 (×8): 3.375 g via INTRAVENOUS
  Filled 2010-12-20 (×10): qty 50

## 2010-12-20 MED ORDER — HYDROMORPHONE HCL PF 1 MG/ML IJ SOLN
1.0000 mg | INTRAMUSCULAR | Status: DC | PRN
  Administered 2010-12-20 – 2010-12-24 (×15): 1 mg via INTRAVENOUS
  Filled 2010-12-20 (×17): qty 1

## 2010-12-20 NOTE — Telephone Encounter (Signed)
Denied-last refill given 10/20/10 #30x3. Too soon for Rx refill.

## 2010-12-20 NOTE — Consult Note (Signed)
Chief complaint: Fever, chills, headache.  History: Patient is a 55 year old female well-known to me status post bilateral total mastectomy and Port-A-Cath placement approximately 4 weeks ago for stage IV carcinoma and locally advanced disease in her breast. Her postoperative course was complicated by bilateral wound cellulitis and required a small portion of the lateral left mastectomy site to be opened. She had positive blood culture for gram-positive cocci in [place] at that time I was unable to find ultimate ID and sensitivities. The patient was treated with almost 2 weeks of IV vancomycin and initially one week of IV Zosyn and was discharged 4 days ago doing well on IV vancomycin at home. She never had any evidence of infection around her port.  She reports her the day before yesterday she developed a fairly rapid onset of confusion shaking chills and took her temperature which she states was almost 104. The patient has been admitted by the hospitalist service. Today she is complaining only of headache and requesting pain medication for this.  Exam: Blood pressure 134/76, pulse 99, temperature 99.3 F (37.4 C), temperature source Oral, resp. rate 20, height 5' 5.5" (1.664 m), weight 196 lb (88.905 kg), SpO2 93.00%. General: Alert in no distress Chest: Port-A-Cath site in the left anterior chest is without erythema or other signs of infection. Breasts: Mastectomy wounds appear clean without evidence of infection. There is a small open area, about 1 cm, laterally on the left that is very clean without purulent drainage or erythema it looks improved compared to last week.  Laboratory results: Potassium 2.4, white blood count 6.8, hemoglobin 9.4, urinalysis negative.  Blood cultures have been drawn and are pending but negative so far. He  Assessment and plan: I see no evidence of active wound infection at this time. I agree with broad-spectrum antibiotic coverage and observation for now.  Dressing change with moist saline gauze over her lateral left mastectomy site daily. She has had no documented high fever or markedly elevated white count while hospitalized. I have some question whether her subjective fever is correct. If she were to continue to be febrile with no other source found she may need her Port-A-Cath removed. I would want a second opinion from oncology and/or infectious disease before removing her Port-A-Cath. I will follow with you.  Mariella Saa MD, FACS  12/20/2010, 2:43 PM

## 2010-12-20 NOTE — ED Provider Notes (Signed)
Medical screening examination/treatment/procedure(s) were performed by non-physician practitioner and as supervising physician I was immediately available for consultation/collaboration.   Gwyneth Sprout, MD 12/20/10 [id number]

## 2010-12-20 NOTE — Progress Notes (Signed)
ANTIBIOTIC CONSULT NOTE - FOLLOW-UP   Pharmacy Consult for Vancomycin Indication: Sepsis Syndrome  Allergies  Allergen Reactions  . Celecoxib   . Infliximab   . Iohexol      Code: SOB   . Ketorolac Tromethamine     REACTION: but pt states not injectable  . Methotrexate   . Nalbuphine   . Nsaids     Patient Measurements: Height: 5' 5.5" (166.4 cm) Weight: 196 lb (88.905 kg) IBW/kg (Calculated) : 58.15     Vital Signs: Temp: 99.3 F (37.4 C) (11/12 0503) Temp src: Oral (11/12 0503) BP: 134/76 mmHg (11/12 0503) Pulse Rate: 99  (11/12 0503) Intake/Output from previous day: 11/11 0701 - 11/12 0700 In: 1000 [P.O.:1000] Out: 600 [Urine:600] Intake/Output from this shift: Total I/O In: -  Out: 175 [Urine:175]  Labs:  [facility] 12/20/10 0550  WBC 6.8  HGB 9.4*  PLT 164  LABCREA --  CREATININE 1.01   Estimated Creatinine Clearance: 69.2 ml/min (by C-G formula based on Cr of 1.01). No results found for this basename: VANCOTROUGH:2,VANCOPEAK:2,VANCORANDOM:2,GENTTROUGH:2,GENTPEAK:2,GENTRANDOM:2,TOBRATROUGH:2,TOBRAPEAK:2,TOBRARND:2,AMIKACINPEAK:2,AMIKACINTROU:2,AMIKACIN:2, in the last 72 hours   Microbiology: Recent Results (from the past 720 hour(s))  CULTURE, BLOOD (ROUTINE X 2)     Status: Normal   Collection Time   11/26/10  2:20 AM      Component Value Range Status Comment   Specimen Description BLOOD LEFT PAC   Final    Special Requests BOTTLES DRAWN AEROBIC AND ANAEROBIC 6 CC EACH   Final    Setup Time [id number]   Final    Culture NO GROWTH 5 DAYS   Final    Report Status 12/02/2010 FINAL   Final   CULTURE, BLOOD (ROUTINE SINGLE)     Status: Normal   Collection Time   12/07/10 10:25 AM      Component Value Range Status Comment   Specimen Description BLOOD   Final    Special Requests BOTTLES DRAWN AEROBIC AND ANAEROBIC   Final    Setup Time [id number]   Final    Culture NO GROWTH 5 DAYS   Final    Report Status 12/13/2010 FINAL   Final     Medical  History: Past Medical History  Diagnosis Date  . PERSONALITY DISORDER   . Chronic pain syndrome     chronic narcotics, dependancy hx - [place] for pain mgmt  . MIGRAINE HEADACHE   . Lyme disease   . ALLERGIC RHINITIS   . ANEMIA-IRON DEFICIENCY   . HYPERLIPIDEMIA   . GERD   . DIABETES MELLITUS, TYPE II   . DEPRESSION   . SEIZURE DISORDER   . CARCINOMA, BREAST 08/2009 dx    R breast, reports mets to liver  . SLE (systemic lupus erythematosus)     [place] ([place]) - chronic pred  . Psoriasis   . Addison's disease   . Metastases to the liver   . Degenerative joint disease   . Migraine headache   . DDD (degenerative disc disease)     Medications:  Scheduled:     . docusate sodium  100 mg Oral BID  . DULoxetine  60 mg Oral BID  . enoxaparin  40 mg Subcutaneous Q24H  . fentaNYL  150 mcg Transdermal Q72H  . gabapentin  900 mg Oral TID  . metoCLOPramide  10 mg Oral BID  . pantoprazole  40 mg Oral Q1200  . piperacillin-tazobactam (ZOSYN)  IV  3.375 g Intravenous Q8H  . potassium chloride  10 mEq Intravenous Q1 Hr  x 2  . potassium chloride SA  20 mEq Oral BID  . potassium chloride  20 mEq Oral Q4H  . traZODone  150 mg Oral QHS  . vancomycin  1,000 mg Intravenous Q8H  . DISCONTD: piperacillin-tazobactam (ZOSYN)  IV  3.375 g Intravenous Q8H   Infusions:     . 0.9 % NaCl with KCl 20 mEq / L 75 mL/hr at 12/20/10 1610   Assessment: Patient's weight now know and due to current SCr = 1.01 on  12/20/10 and CrCl (n) = 70 ml/min will change dose from Vanc 1g IV q8 to 1g IV q12 per protocol.  Goal of Therapy:  Vancomycin trough level 15-20 mcg/ml  Plan:  1.  Change Vanc to 1g IV q12 2. Continue current Zosyn dosing 3.  Check Vancomycin trough level as needed.  Berkley Harvey 12/20/2010,9:03 AM

## 2010-12-20 NOTE — Progress Notes (Signed)
UR completed 

## 2010-12-20 NOTE — Progress Notes (Signed)
Subjective: Patient complains of hurting all over, especially she complains of a headache. She is tearful and requesting more pain medication. She denies any chest pain or shortness of breath. She main complaint is of hurting. She denies any drainage from her wound site.  Objective: Weight change:   Intake/Output Summary (Last 24 hours) at 12/20/10 1714 Last data filed at 12/20/10 1500  Gross per 24 hour  Intake   1740 ml  Output   1626 ml  Net    114 ml   BP 143/67  Pulse 82  Temp(Src) 98.9 F (37.2 C) (Oral)  Resp 18  Ht 5' 5.5" (1.664 m)  Wt 88.905 kg (196 lb)  BMI 32.12 kg/m2  SpO2 98% General appearance: alert, appears older than stated age, mild distress and mildly obese Lungs: clear to auscultation bilaterally Heart: regular rate and rhythm, S1, S2 normal, no murmur, click, rub or gallop Abdomen: soft, non-tender; bowel sounds normal; no masses,  no organomegaly Extremities: extremities normal, atraumatic, no cyanosis or edema No drainage from small open area on left-sided breast.  Lab Results: Basic Metabolic Panel:  Basename 12/20/10 0550  NA 130*  Carr 2.4*  CL 90*  CO2 31  GLUCOSE 111*  BUN 7  CREATININE 1.01  CALCIUM 7.9*  MG 1.3*  PHOS --   CBC:  Basename 12/20/10 0550  WBC 6.8  NEUTROABS --  HGB 9.4*  HCT 30.5*  MCV 82.0  PLT 164  Thyroid Function Tests:  Basename 12/20/10 0550  TSH 1.322  T4TOTAL --  FREET4 --  T3FREE --  THYROIDAB --   Studies/Results: Dg Chest 2 View  11/26/2010 IMPRESSION: No evidence of active pulmonary disease.  Air-fluid levels in the subcutaneous tissues of the right lateral anterior chest wall may represent postoperative fluid collection or soft tissue abscess.   Medications: Scheduled Meds:   . docusate sodium  100 mg Oral BID  . DULoxetine  60 mg Oral BID  . enoxaparin  40 mg Subcutaneous Q24H  . fentaNYL  150 mcg Transdermal Q72H  . gabapentin  900 mg Oral TID  . metoCLOPramide  10 mg Oral BID  .  pantoprazole  40 mg Oral Q1200  . piperacillin-tazobactam (ZOSYN)  IV  3.375 g Intravenous Q8H  . potassium chloride  10 mEq Intravenous Q1 Hr x 2  . potassium chloride SA  20 mEq Oral BID  . potassium chloride  20 mEq Oral Q4H  . traZODone  150 mg Oral QHS  . vancomycin  1,000 mg Intravenous Q12H  . DISCONTD: piperacillin-tazobactam (ZOSYN)  IV  3.375 g Intravenous Q8H  . DISCONTD: vancomycin  1,000 mg Intravenous Q8H   Continuous Infusions:   . 0.9 % NaCl with KCl 20 mEq / L 75 mL/hr at 12/20/10 1400   PRN Meds:.acetaminophen, acetaminophen, carisoprodol, clonazePAM, HYDROcodone-acetaminophen, HYDROmorphone (DILAUDID) injection, ondansetron (ZOFRAN) IV, ondansetron, promethazine, promethazine, DISCONTD: HYDROmorphone  Assessment/Plan: Patient Active Hospital Problem List: Immunocompromised patient (12/19/2010)   Assessment: Stable.  CARCINOMA, BREAST (09/07/2009)   Assessment: Status post mastectomy bilateral.  DIABETES MELLITUS, TYPE II (09/07/2009)   Assessment: Following CBGs  HYPERLIPIDEMIA (09/07/2009)   Assessment: Stable  PERSONALITY DISORDER (09/01/2009)   Assessment: Continue home meds  Chronic pain syndrome (09/07/2009)   Assessment: No narcotics other than which is currently on.  MIGRAINE HEADACHE (09/07/2009)   Assessment: Migraine medicine as needed, no narcotics  GERD (09/07/2009)   Assessment: Continue PPI  Sepsis syndrome (12/19/2010)   Assessment: No clear answer yet if this is a continued  infection. Patient has had no further temperature spikes since arrival to this hospital. Her eyes temperatures to 99.9. Continue IV antibiotics and continue to monitor.   LOS: 1 day   [name] Carr 12/20/2010, 5:14 PM

## 2010-12-20 NOTE — ED Notes (Signed)
Per Dr. Donnetta Simpers ok to stick peripherally for 1 BC, obtain other set from port.

## 2010-12-20 NOTE — Progress Notes (Signed)
ANTIBIOTIC CONSULT NOTE - INITIAL  Pharmacy Consult for Vancomycin Indication: Sepsis Syndrome  Allergies  Allergen Reactions  . Celecoxib   . Infliximab   . Iohexol      Code: SOB   . Ketorolac Tromethamine     REACTION: but pt states not injectable  . Methotrexate   . Nalbuphine   . Nsaids     Patient Measurements:      Vital Signs: Temp: 99.9 F (37.7 C) (11/12 0028) Temp src: Oral (11/11 2008) BP: 159/73 mmHg (11/11 2315) Pulse Rate: 81  (11/11 2019) Intake/Output from previous day:   Intake/Output from this shift:    Labs: No results found for this basename: WBC:3,HGB:3,PLT:3,LABCREA:3,CREATININE:3 in the last 72 hours The CrCl is unknown because both a height and weight (above a minimum accepted value) are required for this calculation. No results found for this basename: VANCOTROUGH:2,VANCOPEAK:2,VANCORANDOM:2,GENTTROUGH:2,GENTPEAK:2,GENTRANDOM:2,TOBRATROUGH:2,TOBRAPEAK:2,TOBRARND:2,AMIKACINPEAK:2,AMIKACINTROU:2,AMIKACIN:2, in the last 72 hours   Microbiology: Recent Results (from the past 720 hour(s))  CULTURE, BLOOD (ROUTINE X 2)     Status: Normal   Collection Time   11/26/10  2:20 AM      Component Value Range Status Comment   Specimen Description BLOOD LEFT PAC   Final    Special Requests BOTTLES DRAWN AEROBIC AND ANAEROBIC 6 CC EACH   Final    Setup Time [id number]   Final    Culture NO GROWTH 5 DAYS   Final    Report Status 12/02/2010 FINAL   Final   CULTURE, BLOOD (ROUTINE SINGLE)     Status: Normal   Collection Time   12/07/10 10:25 AM      Component Value Range Status Comment   Specimen Description BLOOD   Final    Special Requests BOTTLES DRAWN AEROBIC AND ANAEROBIC   Final    Setup Time [id number]   Final    Culture NO GROWTH 5 DAYS   Final    Report Status 12/13/2010 FINAL   Final     Medical History: Past Medical History  Diagnosis Date  . PERSONALITY DISORDER   . Chronic pain syndrome     chronic narcotics, dependancy hx -  [place] for pain mgmt  . MIGRAINE HEADACHE   . Lyme disease   . ALLERGIC RHINITIS   . ANEMIA-IRON DEFICIENCY   . HYPERLIPIDEMIA   . GERD   . DIABETES MELLITUS, TYPE II   . DEPRESSION   . SEIZURE DISORDER   . CARCINOMA, BREAST 08/2009 dx    R breast, reports mets to liver  . SLE (systemic lupus erythematosus)     rheum [place] ([place]) - chronic pred  . Psoriasis   . Addison's disease   . Metastases to the liver   . Degenerative joint disease   . Migraine headache   . DDD (degenerative disc disease)     Medications:  Scheduled:    . docusate sodium  100 mg Oral BID  . DULoxetine  60 mg Oral BID  . enoxaparin  40 mg Subcutaneous Q24H  . fentaNYL  150 mcg Transdermal Q72H  . gabapentin  900 mg Oral TID  . HYDROmorphone      . metoCLOPramide  10 mg Oral BID  . pantoprazole  40 mg Oral Q1200  . piperacillin-tazobactam (ZOSYN)  IV  3.375 g Intravenous Q8H  . potassium chloride SA  20 mEq Oral BID  . traZODone  150 mg Oral QHS  . DISCONTD: piperacillin-tazobactam (ZOSYN)  IV  3.375 g Intravenous Q8H   Infusions:    .  0.9 % NaCl with KCl 20 mEq / L     Assessment: 55 yr female s/p bilateral mastectomies in October 2012 was started on Vancomycin during that admission for cellulitis and discharged on 1250mg  IV q24h.  Per MD note, pt missed Vancomycin dose on 12/18/10. MD note states currently no cellulitis and IV Vancomycin and Zosyn to begin for sepsis syndrome.  Scr = 0.86 on 12/08/10 and CrCl (n) = 84 ml/min  Goal of Therapy:  Vancomycin trough level 15-20 mcg/ml  Plan:  1.  Vancomycin 1000mg  IV q8h 2.  Check Vancomycin trough level as needed.  Lorely Bubb, Joselyn Glassman 12/20/2010,2:29 AM

## 2010-12-21 ENCOUNTER — Inpatient Hospital Stay (HOSPITAL_COMMUNITY): Payer: Medicare Other

## 2010-12-21 DIAGNOSIS — E876 Hypokalemia: Secondary | ICD-10-CM

## 2010-12-21 LAB — CBC
Platelets: 137 10*3/uL — ABNORMAL LOW (ref 150–400)
RDW: 15.8 % — ABNORMAL HIGH (ref 11.5–15.5)
WBC: 4.6 10*3/uL (ref 4.0–10.5)

## 2010-12-21 LAB — BASIC METABOLIC PANEL
Calcium: 8 mg/dL — ABNORMAL LOW (ref 8.4–10.5)
Creatinine, Ser: 0.98 mg/dL (ref 0.50–1.10)
GFR calc Af Amer: 73 mL/min — ABNORMAL LOW (ref >90)

## 2010-12-21 MED ORDER — VITAMINS A & D EX OINT
TOPICAL_OINTMENT | CUTANEOUS | Status: AC
  Administered 2010-12-21: 5
  Filled 2010-12-21: qty 5

## 2010-12-21 MED ORDER — POTASSIUM CHLORIDE CRYS ER 20 MEQ PO TBCR
40.0000 meq | EXTENDED_RELEASE_TABLET | Freq: Once | ORAL | Status: AC
  Administered 2010-12-21: 40 meq via ORAL
  Filled 2010-12-21: qty 2

## 2010-12-21 NOTE — Progress Notes (Signed)
Subjective: The patient complains of hurting all over, especially over the right side of her right mastectomy, especially deep inspiration. No shortness of breath. Her headache she states that she little bit better. Her appetite is better.  Objective: Weight change:   Intake/Output Summary (Last 24 hours) at 12/21/10 1223 Last data filed at 12/21/10 0900  Gross per 24 hour  Intake   1252 ml  Output   1700 ml  Net   -448 ml   BP 116/71  Pulse 74  Temp(Src) 98.9 F (37.2 C) (Oral)  Resp 18  Ht 5' 5.5" (1.664 m)  Wt 88.905 kg (196 lb)  BMI 32.12 kg/m2  SpO2 93% General appearance: alert, appears older than stated age, no distress and mildly obese  Lungs: clear to auscultation bilaterally  Heart: regular rate and rhythm, S1, S2 normal, no murmur, click, rub or gallop  Abdomen: soft, non-tender; bowel sounds normal; no masses, no organomegaly  Extremities: extremities normal, atraumatic, no cyanosis or edema  Bilateral mastectomy sites look clean no drainage from wounds. No severe erythema. No focal areas of tenderness.   Lab Results: Basic Metabolic Panel:  Basename 12/21/10 0454 12/20/10 0550  NA 135 130*  Carr 2.9* 2.4*  CL 97 90*  CO2 33* 31  GLUCOSE 161* 111*  BUN 7 7  CREATININE 0.98 1.01  CALCIUM 8.0* 7.9*  MG -- 1.3*  PHOS -- --   CBC:  Basename 12/21/10 0454 12/20/10 0550  WBC 4.6 6.8  NEUTROABS -- --  HGB 9.4* 9.4*  HCT 30.5* 30.5*  MCV 83.3 82.0  PLT 137* 164  Thyroid Function Tests:  Basename 12/20/10 0550  TSH 1.322  T4TOTAL --  FREET4 --  T3FREE --  THYROIDAB --   Ct Chest Wo Contrast  12/21/2010  IMPRESSION:  1.  Bilateral chest wall cellulitis. 2.  12 x 4 cm fluid collection in the subcutaneous fat on the right side could reflect a postoperative hematoma, seroma or abscess. 3.  No acute pulmonary findings.  Original Report Authenticated By: P. Loralie Champagne, M.D.   Medications: Scheduled Meds:   . docusate sodium  100 mg Oral BID  .  DULoxetine  60 mg Oral BID  . enoxaparin  40 mg Subcutaneous Q24H  . fentaNYL  150 mcg Transdermal Q72H  . gabapentin  900 mg Oral TID  . metoCLOPramide  10 mg Oral BID  . pantoprazole  40 mg Oral Q1200  . piperacillin-tazobactam (ZOSYN)  IV  3.375 g Intravenous Q8H  . potassium chloride SA  20 mEq Oral BID  . potassium chloride  20 mEq Oral Q4H  . potassium chloride  40 mEq Oral Once  . traZODone  150 mg Oral QHS  . vancomycin  1,000 mg Intravenous Q12H   Continuous Infusions:   . 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 This is after she's been on IV antibiotics broad-spectrum for several days. I went ahead and ordered and chest CT looking for signs of an abscess. Wo does know bilateral cellulitis and fluid collection consistent with a possible seroma versus abscess, I discussed his findings with surgery the patient well. The fluid collection and cellulitis are not new findings  and clinically her incisions and wound does look fine. He had tried multiple times previously aspirated the fluid. I have to concur with surgery that likely the source of her infection and fever is from her Port-A-Cath. Surgery will followup and plans to take the patient to or for Port-A-Cath removal. Continue antibiotics.   CARCINOMA, BREAST (09/07/2009) Assessment: Status post mastectomy bilateral.  DIABETES MELLITUS, TYPE II (09/07/2009) Assessment: Following CBGs  HYPERLIPIDEMIA (09/07/2009) Assessment: Stable  PERSONALITY DISORDER (09/01/2009) Assessment: Continue home meds  Chronic pain syndrome  (09/07/2009) Assessment: No narcotics other than which is currently on.  MIGRAINE HEADACHE (09/07/2009) Assessment: Migraine medicine as needed, no narcotics  GERD (09/07/2009) Assessment: Continue PPI    Hypokalemia (12/21/2010)  replaced    LOS: 2 days   [name] Carr 12/21/2010, 12:23 PM

## 2010-12-21 NOTE — Progress Notes (Signed)
[id number]/Abelardo Seidner, RN, BSN, CCM/CHART REVIEW FOR UR PERFORMED. 

## 2010-12-21 NOTE — Progress Notes (Signed)
Subjective: High fever and malaise last night, feels OK now  Objective: Vital signs in last 24 hours: Temp:  [98.9 F (37.2 C)-102.4 F (39.1 C)] 98.9 F (37.2 C) (11/13 0619) Pulse Rate:  [74-89] 74  (11/13 0619) Resp:  [16-18] 18  (11/13 0619) BP: (116-143)/(67-71) 116/71 mmHg (11/13 0619) SpO2:  [93 %-98 %] 93 % (11/13 0619) Last BM Date: 12/21/10  Intake/Output from previous day: 11/12 0701 - 11/13 0700 In: 1512 [P.O.:965; I.V.:547] Out: 1576 [Urine:1575; Emesis/NG output:1] Intake/Output this shift: Total I/O In: 240 [P.O.:240] Out: 300 [Urine:300]  General appearance: alert and no distress Chest wall: Mastectomy wounds without evidence of infection.  Small/mod seroma R known, previously aspirated-serous  Lab Results:   Basename 12/21/10 0454 12/20/10 0550  WBC 4.6 6.8  HGB 9.4* 9.4*  HCT 30.5* 30.5*  PLT 137* 164   BMET  Basename 12/21/10 0454 12/20/10 0550  NA 135 130*  K 2.9* 2.4*  CL 97 90*  CO2 33* 31  GLUCOSE 161* 111*  BUN 7 7  CREATININE 0.98 1.01  CALCIUM 8.0* 7.9*   PT/INR No results found for this basename: LABPROT:2,INR:2 in the last 72 hours ABG No results found for this basename: PHART:2,PCO2:2,PO2:2,HCO3:2 in the last 72 hours  Studies/Results: Ct Chest Wo Contrast  12/21/2010  *RADIOLOGY REPORT*  Clinical Data: Status post bilateral mastectomy with cellulitis and fever.  CT CHEST WITHOUT CONTRAST  Technique:  Multidetector CT imaging of the chest was performed following the standard protocol without IV contrast.  Comparison: None  Findings: There are skin staples and surgical changes from bilateral mastectomies.  Bilateral interstitial changes in the subcutaneous fat suggesting cellulitis.  There is a large postoperative fluid collection on the right side measuring 12 x 4 cm.  This could be a postoperative liquefied hematoma but cannot exclude an abscess.  The Port-A-Cath is been good position without complicating features.  The  underlying bony thorax is intact.  The heart is normal in size.  No pericardial effusion.  No mediastinal or hilar lymphadenopathy.  The esophagus is grossly normal.  Examination of the lung parenchyma demonstrates no acute pulmonary findings.  No infiltrates, effusions or edema.  No worrisome pulmonary nodules or masses.  The upper abdomen is unremarkable.  The gallbladder is contracted.  IMPRESSION:  1.  Bilateral chest wall cellulitis. 2.  12 x 4 cm fluid collection in the subcutaneous fat on the right side could reflect a postoperative hematoma, seroma or abscess. 3.  No acute pulmonary findings.  Original Report Authenticated By: P. Loralie Champagne, M.D.    Anti-infectives: Anti-infectives     Start     Dose/Rate Route Frequency Ordered Stop   12/20/10 1500   vancomycin (VANCOCIN) IVPB 1000 mg/200 mL premix        1,000 mg 200 mL/hr over 60 Minutes Intravenous Every 12 hours 12/20/10 0910     12/20/10 0300   vancomycin (VANCOCIN) IVPB 1000 mg/200 mL premix  Status:  Discontinued        1,000 mg 200 mL/hr over 60 Minutes Intravenous Every 8 hours 12/20/10 0256 12/20/10 0910   12/20/10 0150  piperacillin-tazobactam (ZOSYN) IVPB 3.375 g       3.375 g 12.5 mL/hr over 240 Minutes Intravenous 3 times per day 12/20/10 0150     12/19/10 2245   piperacillin-tazobactam (ZOSYN) IVPB 3.375 g  Status:  Discontinued        3.375 g 12.5 mL/hr over 240 Minutes Intravenous 3 times per day  12/19/10 2243 12/19/10 2247          Assessment/Plan: Continued fever spikes.  No evidence that  this is coming from her mastectomy wounds.  CT is consistent with known, previously aspirated non infected seroma.  I think she has catheter related sepsis.  Pt is very reluctant, essentially refusing porta cath removal without an ID consult. I think this is not unreasonable and will discuss with primary team    LOS: 2 days    [name] Carr T 12/21/2010

## 2010-12-22 LAB — BASIC METABOLIC PANEL
BUN: 4 mg/dL — ABNORMAL LOW (ref 6–23)
Chloride: 98 mEq/L (ref 96–112)
Creatinine, Ser: 0.98 mg/dL (ref 0.50–1.10)
GFR calc Af Amer: 73 mL/min — ABNORMAL LOW (ref >90)

## 2010-12-22 LAB — WOUND CULTURE

## 2010-12-22 LAB — CBC
HCT: 30.1 % — ABNORMAL LOW (ref 36.0–46.0)
MCHC: 30.9 g/dL (ref 30.0–36.0)
MCV: 83.1 fL (ref 78.0–100.0)
RDW: 15.6 % — ABNORMAL HIGH (ref 11.5–15.5)

## 2010-12-22 MED ORDER — DEXTROSE 5 % IV SOLN
2.0000 g | Freq: Two times a day (BID) | INTRAVENOUS | Status: DC
  Administered 2010-12-22 – 2010-12-27 (×10): 2 g via INTRAVENOUS
  Filled 2010-12-22 (×12): qty 2

## 2010-12-22 NOTE — Consult Note (Signed)
Reason for Consult portacath and blood stream infection Referring Physician: Dr. Matthew Folks [name] Carr is an 55 y.o. female. With stage IV breast cancer, partial partially treated with chemotherapy ultimately undergoing a bilateral mastectomy in early October his course was complicated by coagulase-negative staphylococcal bacteremia. Her coagulase-negative Staphylococcus species was isolated on blood culture drawn on the 22nd which did 2 blood cultures were positive for coagulase-negative Staphylococcus species. This enterococcus species was sensitive to oxacillin and gentamicin levofloxacin vancomycin clindamycin erythromycin rifampin and tetracycline was resistant to penicillin. The patient was managed as an inpatient the surgery team had attempted to treat through the Port-A-Cath and center home on vancomycin. Unfortunately while the patient was administering vancomycin she developed severe fevers up to 104 along with malaise rigors nd some mild confusion. She was readmitted to was a long hospital and blood cultures were added and taken again this time on the 11th at [facility], the 12th that was a long period. The blood cultures on the 11th are growing no organisms from one set of cultures and some gram-negative rods that appear to be likely an Enterobacter species on the other isolated culture. The patient was placed on vancomycin and Zosyn but continues to have fevers. There is great concern for infection of the Port-A-Cath site. Relates to me a prior history of a Port-A-Cath infection approximately a year ago that at that time was a mix of a staph species and gram-negative species. She does have a history of having had methicillin-resistant staph aureus having been isolated from a wound culture on her leg as well as an ampicillin sensitive enterococcus have been isolated from a breast lesion. Currently she is being followed closely by internal medicine as well as general surgery who did not  feel the patient has much to suggest abscesses in the chest wall themselves. She does have some lower back pain which is chronic but has worsened recently. Her consult and assist in management of this patient with likely Port-A-Cath infection and bacteremia. I spent greater than 60 minutes with the patient including greater than 50% of time in face to face counsel of the patient and in coordination of their care.    Past Medical History  Diagnosis Date  . PERSONALITY DISORDER   . Chronic pain syndrome     chronic narcotics, dependancy hx - [facility] for pain mgmt  . MIGRAINE HEADACHE   . Lyme disease   . ALLERGIC RHINITIS   . ANEMIA-IRON DEFICIENCY   . HYPERLIPIDEMIA   . GERD   . DIABETES MELLITUS, TYPE II   . DEPRESSION   . SEIZURE DISORDER   . CARCINOMA, BREAST 08/2009 dx    R breast, reports mets to liver  . SLE (systemic lupus erythematosus)     r[facility] - chronic pred  . Psoriasis   . Addison's disease   . Metastases to the liver   . Degenerative joint disease   . Migraine headache   . DDD (degenerative disc disease)   Prior Portacath infection  Past Surgical History  Procedure Date  . Breast surgery     breast biopsy  . Liver bopsy   . Port a cath placement   . Port-a-cath removal   . Mastectomy 11/19/10    bilateral mastectomy     Family History  Problem Relation Age of Onset  . Lung cancer Mother   . Lung cancer Father   . Arthritis Other   . Diabetes Other   . Hyperlipidemia  Other     Social History:  reports that she has never smoked. She has never used smokeless tobacco. She reports that she does not drink alcohol or use illicit drugs.  Allergies:  Allergies  Allergen Reactions  . Infliximab Nausea And Vomiting  . Iohexol      Code: SOB   . Ketorolac Tromethamine     REACTION: but pt states not injectable  . Methotrexate Other (See Comments)    unknown  . Nalbuphine Other (See Comments)    unknown  . Nsaids Nausea And Vomiting  .  Celecoxib Rash    Medications: I have reviewed the patient's current medications.  Anti-infectives     Start     Dose/Rate Route Frequency Ordered Stop   12/20/10 1500   vancomycin (VANCOCIN) IVPB 1000 mg/200 mL premix        1,000 mg 200 mL/hr over 60 Minutes Intravenous Every 12 hours 12/20/10 0910     12/20/10 0300   vancomycin (VANCOCIN) IVPB 1000 mg/200 mL premix  Status:  Discontinued        1,000 mg 200 mL/hr over 60 Minutes Intravenous Every 8 hours 12/20/10 0256 12/20/10 0910   12/20/10 0150  piperacillin-tazobactam (ZOSYN) IVPB 3.375 g       3.375 g 12.5 mL/hr over 240 Minutes Intravenous 3 times per day 12/20/10 0150     12/19/10 2245   piperacillin-tazobactam (ZOSYN) IVPB 3.375 g  Status:  Discontinued        3.375 g 12.5 mL/hr over 240 Minutes Intravenous 3 times per day 12/19/10 2243 12/19/10 2247          Blood pressure 130/74, pulse 86, temperature 98.4 F (36.9 C), temperature source Oral, resp. rate 18, height 5' 5.5" (1.664 m), weight 196 lb (88.905 kg), SpO2 97.00%.  PHYSICAL EXAM: Gen. a pleasant lady in no acute distress  HEENT normocephalic macules that are quite icteric oropharynx clear without lesions . Cardiovascular exam exam regular rate with a n no murmurs gallops or rubs heard  Pulmonary clear to auscultation bilaterally without wheezes rhonchi rales.  Abdomen soft nondistended non-tender positive bowel sounds  Extremities without edema.  Skin the patient's incision sites are clean dry and intact without obvious fluctuance or purulence. Her Port-A-Cath site has some warmth about it but is not obviously fluctuant.  Muscular skeletal patient hasn't tenderness along her lumbar spine.   Neurological exam nonfocal with intact strength and sensation throughout.      Ct Chest Wo Contrast  12/21/2010  *RADIOLOGY REPORT*  Clinical Data: Status post bilateral mastectomy with cellulitis and fever.  CT CHEST WITHOUT CONTRAST  Technique:   Multidetector CT imaging of the chest was performed following the standard protocol without IV contrast.  Comparison: None  Findings: There are skin staples and surgical changes from bilateral mastectomies.  Bilateral interstitial changes in the subcutaneous fat suggesting cellulitis.  There is a large postoperative fluid collection on the right side measuring 12 x 4 cm.  This could be a postoperative liquefied hematoma but cannot exclude an abscess.  The Port-A-Cath is been good position without complicating features.  The underlying bony thorax is intact.  The heart is normal in size.  No pericardial effusion.  No mediastinal or hilar lymphadenopathy.  The esophagus is grossly normal.  Examination of the lung parenchyma demonstrates no acute pulmonary findings.  No infiltrates, effusions or edema.  No worrisome pulmonary nodules or masses.  The upper abdomen is unremarkable.  The gallbladder is contracted.  IMPRESSION:  1.  Bilateral chest wall cellulitis. 2.  12 x 4 cm fluid collection in the subcutaneous fat on the right side could reflect a postoperative hematoma, seroma or abscess. 3.  No acute pulmonary findings.  Original Report Authenticated By: P. Loralie Champagne, M.D.    Results for orders placed during the hospital encounter of 12/19/10 (from the past 48 hour(s))  BASIC METABOLIC PANEL     Status: Abnormal   Collection Time   12/21/10  4:54 AM      Component Value Range Comment   Sodium 135  135 - 145 (mEq/L)    Potassium 2.9 (*) 3.5 - 5.1 (mEq/L)    Chloride 97  96 - 112 (mEq/L)    CO2 33 (*) 19 - 32 (mEq/L)    Glucose, Bld 161 (*) 70 - 99 (mg/dL)    BUN 7  6 - 23 (mg/dL)    Creatinine, Ser 9.56  0.50 - 1.10 (mg/dL)    Calcium 8.0 (*) 8.4 - 10.5 (mg/dL)    GFR calc non Af Amer 63 (*) >90 (mL/min)    GFR calc Af Amer 73 (*) >90 (mL/min)   CBC     Status: Abnormal   Collection Time   12/21/10  4:54 AM      Component Value Range Comment   WBC 4.6  4.0 - 10.5 (K/uL)    RBC 3.66 (*)  3.87 - 5.11 (MIL/uL)    Hemoglobin 9.4 (*) 12.0 - 15.0 (g/dL)    HCT 21.3 (*) 08.6 - 46.0 (%)    MCV 83.3  78.0 - 100.0 (fL)    MCH 25.7 (*) 26.0 - 34.0 (pg)    MCHC 30.8  30.0 - 36.0 (g/dL)    RDW 57.8 (*) 46.9 - 15.5 (%)    Platelets 137 (*) 150 - 400 (K/uL)   VANCOMYCIN, TROUGH     Status: Normal   Collection Time   12/22/10  2:54 AM      Component Value Range Comment   Vancomycin Tr 15.7  10.0 - 20.0 (ug/mL)   CBC     Status: Abnormal   Collection Time   12/22/10  2:54 AM      Component Value Range Comment   WBC 4.7  4.0 - 10.5 (K/uL)    RBC 3.62 (*) 3.87 - 5.11 (MIL/uL)    Hemoglobin 9.3 (*) 12.0 - 15.0 (g/dL)    HCT 62.9 (*) 52.8 - 46.0 (%)    MCV 83.1  78.0 - 100.0 (fL)    MCH 25.7 (*) 26.0 - 34.0 (pg)    MCHC 30.9  30.0 - 36.0 (g/dL)    RDW 41.3 (*) 24.4 - 15.5 (%)    Platelets 128 (*) 150 - 400 (K/uL) REPEATED TO VERIFY  BASIC METABOLIC PANEL     Status: Abnormal   Collection Time   12/22/10  2:54 AM      Component Value Range Comment   Sodium 135  135 - 145 (mEq/L)    Potassium 3.2 (*) 3.5 - 5.1 (mEq/L)    Chloride 98  96 - 112 (mEq/L)    CO2 30  19 - 32 (mEq/L)    Glucose, Bld 110 (*) 70 - 99 (mg/dL)    BUN 4 (*) 6 - 23 (mg/dL)    Creatinine, Ser 0.10  0.50 - 1.10 (mg/dL)    Calcium 8.0 (*) 8.4 - 10.5 (mg/dL)    GFR calc non Af Amer 63 (*) >90 (mL/min)    GFR calc  Af Amer 73 (*) >90 (mL/min)        Recent Results (from the past 720 hour(s))  CULTURE, BLOOD (ROUTINE X 2)     Status: Normal   Collection Time   11/26/10  2:20 AM      Component Value Range Status Comment   Specimen Description BLOOD LEFT PAC   Final    Special Requests BOTTLES DRAWN AEROBIC AND ANAEROBIC 6 CC EACH   Final    Setup Time [id number]   Final    Culture NO GROWTH 5 DAYS   Final    Report Status 12/02/2010 FINAL   Final   CULTURE, BLOOD (ROUTINE SINGLE)     Status: Normal   Collection Time   12/07/10 10:25 AM      Component Value Range Status Comment   Specimen  Description BLOOD   Final    Special Requests BOTTLES DRAWN AEROBIC AND ANAEROBIC   Final    Setup Time [id number]   Final    Culture NO GROWTH 5 DAYS   Final    Report Status 12/13/2010 FINAL   Final   CULTURE, BLOOD (SINGLE)     Status: Normal (Preliminary result)   Collection Time   12/20/10 12:10 AM      Component Value Range Status Comment   Specimen Description BLOOD LEFT CHEST PORT   Final    Special Requests BOTTLES DRAWN AEROBIC AND ANAEROBIC 5CC   Final    Setup Time [id number]   Final    Culture     Final    Value:        BLOOD CULTURE RECEIVED NO GROWTH TO DATE CULTURE WILL BE HELD FOR 5 DAYS BEFORE ISSUING A FINAL NEGATIVE REPORT   Report Status PENDING   Incomplete   CULTURE, BLOOD (SINGLE)     Status: Normal (Preliminary result)   Collection Time   12/20/10 12:20 AM      Component Value Range Status Comment   Specimen Description BLOOD R HAND   Final    Special Requests BOTTLES DRAWN AEROBIC ONLY 5CC   Final    Setup Time [id number]   Final    Culture     Final    Value:        BLOOD CULTURE RECEIVED NO GROWTH TO DATE CULTURE WILL BE HELD FOR 5 DAYS BEFORE ISSUING A FINAL NEGATIVE REPORT   Report Status PENDING   Incomplete   WOUND CULTURE     Status: Normal   Collection Time   12/20/10  9:45 AM      Component Value Range Status Comment   Specimen Description BREAST   Final    Special Requests NONE   Final    Gram Stain     Final    Value: RARE WBC PRESENT, PREDOMINANTLY MONONUCLEAR     NO SQUAMOUS EPITHELIAL CELLS SEEN     NO ORGANISMS SEEN   Culture NO GROWTH 2 DAYS   Final    Report Status 12/22/2010 FINAL   Final   MRSA PCR SCREENING     Status: Normal   Collection Time   12/20/10  9:45 AM      Component Value Range Status Comment   MRSA by PCR NEGATIVE  NEGATIVE  Final     ROS: as per HPI otherwise negative on 12 point reveiw      Assessment/Plan: 55 year old Caucasian lady with stage IV breast cancer sp bilateral mastectomies earlier in  October also placement of  Port-A-Cath who developed Port-A-Cath related bacteremia with coagulase-negative Staphylococcus species who has failed IV antibiotic therapy and attempts to salvage the port now readmitted with fevers and persistent Port-A-Cath infection.  #1coaguluase negative staphylococcal bacteremia associated with Port-A-Cath: Patient is failed attempts to salvage the Port-A-Cath with IV Avelox alone in the Port-A-Cath does need be surgically removed. She is also now drying A. Enterobacter from one blood culture on the 11th although at the site of the other blood culture is negative.  --Discuss with surgery about the urgent need to remove the Port-A-Cath --One could consider getting a transesophageal echocardiogram to exclude endocarditis but the patient would not tolerate another procedure to do this. Rather out air on the side of preserving the patient might have endocarditis and treat her for at least 4 if not 6 weeks of IV antibiotics. Given the sensitivity profile of the cartilage negative staph this could be likely done with 2 g of Ancef given 3 times daily. For the time being however I will keep her on low bit more broad coverage in the form of cefepime to cover the Enterobacter which was isolated from one of her 2 blood cultures obtained on the 11th. Again I recommend removing the Port-A-Cath and if there is purulent material along the Port-A-Cath site would also send this for culture. Again my suspicion is that the prime culprit here is the coagulase-negative staph at the end her bacteria is more likely a contaminant. For now however we will call cover both. --But also contemplating an MRI with contrast of the lumbar spine dementia the patient has not developed evidence of discitis given her back pain and given the bacteremia that she had.  #2 Portacath infection'; again this should come out and I recommend getting cultures from any purulent material found along portacath site as well  as sending the tip of catheter for culture        Paulette Blanch Dam 12/22/2010, 3:25 PM

## 2010-12-22 NOTE — Progress Notes (Signed)
Subjective: Some pain at surgical sites  Objective: Vital signs in last 24 hours: Temp:  [98.7 F (37.1 C)-99.7 F (37.6 C)] 98.7 F (37.1 C) (11/14 0527) Pulse Rate:  [77-86] 77  (11/14 0527) Resp:  [15-16] 16  (11/14 0527) BP: (142-155)/(67-79) 142/67 mmHg (11/14 0527) SpO2:  [91 %-99 %] 91 % (11/14 0527) Last BM Date: 02/19/10  Intake/Output from previous day: 11/13 0701 - 11/14 0700 In: 3224 [P.O.:1545; I.V.:1379; IV Piggyback:300] Out: 2754 [Urine:2750; Stool:4] Intake/Output this shift:    General appearance: alert and no distress Incision/Wound:Clean without evidence of infection  Lab Results:   [facility] 12/22/10 0254 12/21/10 0454  WBC 4.7 4.6  HGB 9.3* 9.4*  HCT 30.1* 30.5*  PLT 128* 137*   BMET  Basename 12/22/10 0254 12/21/10 0454  NA 135 135  K 3.2* 2.9*  CL 98 97  CO2 30 33*  GLUCOSE 110* 161*  BUN 4* 7  CREATININE 0.98 0.98  CALCIUM 8.0* 8.0*   PT/INR No results found for this basename: LABPROT:2,INR:2 in the last 72 hours ABG No results found for this basename: PHART:2,PCO2:2,PO2:2,HCO3:2 in the last 72 hours  Studies/Results: Ct Chest Wo Contrast  12/21/2010  *RADIOLOGY REPORT*  Clinical Data: Status post bilateral mastectomy with cellulitis and fever.  CT CHEST WITHOUT CONTRAST  Technique:  Multidetector CT imaging of the chest was performed following the standard protocol without IV contrast.  Comparison: None  Findings: There are skin staples and surgical changes from bilateral mastectomies.  Bilateral interstitial changes in the subcutaneous fat suggesting cellulitis.  There is a large postoperative fluid collection on the right side measuring 12 x 4 cm.  This could be a postoperative liquefied hematoma but cannot exclude an abscess.  The Port-A-Cath is been good position without complicating features.  The underlying bony thorax is intact.  The heart is normal in size.  No pericardial effusion.  No mediastinal or hilar lymphadenopathy.   The esophagus is grossly normal.  Examination of the lung parenchyma demonstrates no acute pulmonary findings.  No infiltrates, effusions or edema.  No worrisome pulmonary nodules or masses.  The upper abdomen is unremarkable.  The gallbladder is contracted.  IMPRESSION:  1.  Bilateral chest wall cellulitis. 2.  12 x 4 cm fluid collection in the subcutaneous fat on the right side could reflect a postoperative hematoma, seroma or abscess. 3.  No acute pulmonary findings.  Original Report Authenticated By: P. Loralie Champagne, M.D.    Anti-infectives: Anti-infectives     Start     Dose/Rate Route Frequency Ordered Stop   12/20/10 1500   vancomycin (VANCOCIN) IVPB 1000 mg/200 mL premix        1,000 mg 200 mL/hr over 60 Minutes Intravenous Every 12 hours 12/20/10 0910     12/20/10 0300   vancomycin (VANCOCIN) IVPB 1000 mg/200 mL premix  Status:  Discontinued        1,000 mg 200 mL/hr over 60 Minutes Intravenous Every 8 hours 12/20/10 0256 12/20/10 0910   12/20/10 0150  piperacillin-tazobactam (ZOSYN) IVPB 3.375 g       3.375 g 12.5 mL/hr over 240 Minutes Intravenous 3 times per day 12/20/10 0150     12/19/10 2245   piperacillin-tazobactam (ZOSYN) IVPB 3.375 g  Status:  Discontinued        3.375 g 12.5 mL/hr over 240 Minutes Intravenous 3 times per day 12/19/10 2243 12/19/10 2247          Assessment/Plan: Stable without fever for 24hr.  Await ID eval re removal portacath    LOS: 3 days    Mostyn Varnell T 12/22/2010

## 2010-12-22 NOTE — Progress Notes (Signed)
redudnat note EPic wont let me delete Subjective:    Patient ID: [name] Carr, female    DOB: Apr 12, 1955, 55 y.o.   MRN: [id number]  Altered Mental Status      Review of Systems  Psychiatric/Behavioral: Positive for altered mental status.       Objective:   Physical Exam        Assessment & Plan:

## 2010-12-22 NOTE — Progress Notes (Signed)
ANTIBIOTIC CONSULT NOTE - INITIAL  Pharmacy Consult for Cefepime Indication: Coagulase-negative Staphylococcus species bacteremia  Allergies  Allergen Reactions  . Infliximab Nausea And Vomiting  . Iohexol      Code: SOB   . Ketorolac Tromethamine     REACTION: but pt states not injectable  . Methotrexate Other (See Comments)    unknown  . Nalbuphine Other (See Comments)    unknown  . Nsaids Nausea And Vomiting  . Celecoxib Rash    Patient Measurements: Height: 5' 5.5" (166.4 cm) Weight: 196 lb (88.905 kg) IBW/kg (Calculated) : 58.15    Vital Signs: Temp: 98.4 F (36.9 C) (11/14 1340) Temp src: Oral (11/14 1340) BP: 130/74 mmHg (11/14 1340) Pulse Rate: 86  (11/14 1340) Intake/Output from previous day: 11/13 0701 - 11/14 0700 In: 3224 [P.O.:1545; I.V.:1379; IV Piggyback:300] Out: 2754 [Urine:2750; Stool:4] Intake/Output from this shift: Total I/O In: 2545 [P.O.:1785; I.V.:760] Out: 1051 [Urine:1050; Stool:1]  Labs:  [facility] 12/22/10 0254 12/21/10 0454 12/20/10 0550  WBC 4.7 4.6 6.8  HGB 9.3* 9.4* 9.4*  PLT 128* 137* 164  LABCREA -- -- --  CREATININE 0.98 0.98 1.01   Estimated Creatinine Clearance: 71.3 ml/min (by C-G formula based on Cr of 0.98).  Basename 12/22/10 0254  VANCOTROUGH 15.7  VANCOPEAK --  VANCORANDOM --  GENTTROUGH --  GENTPEAK --  GENTRANDOM --  TOBRATROUGH --  TOBRAPEAK --  TOBRARND --  AMIKACINPEAK --  AMIKACINTROU --  AMIKACIN --     Microbiology: Recent Results (from the past 720 hour(s))  CULTURE, BLOOD (ROUTINE X 2)     Status: Normal   Collection Time   11/26/10  2:20 AM      Component Value Range Status Comment   Specimen Description BLOOD LEFT PAC   Final    Special Requests BOTTLES DRAWN AEROBIC AND ANAEROBIC 6 CC EACH   Final    Setup Time [id number]   Final    Culture NO GROWTH 5 DAYS   Final    Report Status 12/02/2010 FINAL   Final   CULTURE, BLOOD (ROUTINE SINGLE)     Status: Normal   Collection Time   12/07/10 10:25 AM      Component Value Range Status Comment   Specimen Description BLOOD   Final    Special Requests BOTTLES DRAWN AEROBIC AND ANAEROBIC   Final    Setup Time [id number]   Final    Culture NO GROWTH 5 DAYS   Final    Report Status 12/13/2010 FINAL   Final   CULTURE, BLOOD (SINGLE)     Status: Normal (Preliminary result)   Collection Time   12/20/10 12:10 AM      Component Value Range Status Comment   Specimen Description BLOOD LEFT CHEST PORT   Final    Special Requests BOTTLES DRAWN AEROBIC AND ANAEROBIC 5CC   Final    Setup Time [id number]   Final    Culture     Final    Value:        BLOOD CULTURE RECEIVED NO GROWTH TO DATE CULTURE WILL BE HELD FOR 5 DAYS BEFORE ISSUING A FINAL NEGATIVE REPORT   Report Status PENDING   Incomplete   CULTURE, BLOOD (SINGLE)     Status: Normal (Preliminary result)   Collection Time   12/20/10 12:20 AM      Component Value Range Status Comment   Specimen Description BLOOD R HAND   Final    Special Requests BOTTLES DRAWN AEROBIC ONLY 5CC  Final    Setup Time [phone]   Final    Culture     Final    Value:        BLOOD CULTURE RECEIVED NO GROWTH TO DATE CULTURE WILL BE HELD FOR 5 DAYS BEFORE ISSUING A FINAL NEGATIVE REPORT   Report Status PENDING   Incomplete   WOUND CULTURE     Status: Normal   Collection Time   12/20/10  9:45 AM      Component Value Range Status Comment   Specimen Description BREAST   Final    Special Requests NONE   Final    Gram Stain     Final    Value: RARE WBC PRESENT, PREDOMINANTLY MONONUCLEAR     NO SQUAMOUS EPITHELIAL CELLS SEEN     NO ORGANISMS SEEN   Culture NO GROWTH 2 DAYS   Final    Report Status 12/22/2010 FINAL   Final   MRSA PCR SCREENING     Status: Normal   Collection Time   12/20/10  9:45 AM      Component Value Range Status Comment   MRSA by PCR NEGATIVE  NEGATIVE  Final     Medical History: Past Medical History  Diagnosis Date  . PERSONALITY DISORDER   . Chronic pain  syndrome     chronic narcotics, dependancy hx - [place] for pain mgmt  . MIGRAINE HEADACHE   . Lyme disease   . ALLERGIC RHINITIS   . ANEMIA-IRON DEFICIENCY   . HYPERLIPIDEMIA   . GERD   . DIABETES MELLITUS, TYPE II   . DEPRESSION   . SEIZURE DISORDER   . CARCINOMA, BREAST 08/2009 dx    R breast, reports mets to liver  . SLE (systemic lupus erythematosus)     rheum [facility] [place] [facility] - chronic pred  . Psoriasis   . Addison's disease   . Metastases to the liver   . Degenerative joint disease   . Migraine headache   . DDD (degenerative disc disease)     Medications:  Scheduled:    . ceFEPIme (MAXIPIME) 2 GM IVP  2 g Intravenous Q12H  . docusate sodium  100 mg Oral BID  . DULoxetine  60 mg Oral BID  . enoxaparin  40 mg Subcutaneous Q24H  . fentaNYL  150 mcg Transdermal Q72H  . gabapentin  900 mg Oral TID  . metoCLOPramide  10 mg Oral BID  . pantoprazole  40 mg Oral Q1200  . potassium chloride SA  20 mEq Oral BID  . traZODone  150 mg Oral QHS  . vitamin A & D      . DISCONTD: piperacillin-tazobactam (ZOSYN)  IV  3.375 g Intravenous Q8H  . DISCONTD: vancomycin  1,000 mg Intravenous Q12H   Assessment: 55YOF to begin cefepime for coagulase-negative Staphylococcus bacteremia from persistent Port-A-Cath infection.  Broad spectrum antibiotic chosen to also cover for recent history of Enterobacter bacteremia.  Length of treatment per ID consult. Cefepime dose adjusted for Estimated Creatinine Clearance: 71.3 ml/min (by C-G formula based on Cr of 0.98).  Plan:  Cefepime 2g IV q12h.   Clance Boll 12/22/2010,4:13 PM

## 2010-12-22 NOTE — Progress Notes (Signed)
Subjective: Patient doing okay. She complains of mild headache and some generalized body pains but nothing focal. She denies any shortness of breath. She was relieved to hear that we have asked infectious disease to evaluate her.  Objective: Weight change:   Intake/Output Summary (Last 24 hours) at 12/22/10 1357 Last data filed at 12/22/10 0500  Gross per 24 hour  Intake 2623.95 ml  Output   1254 ml  Net 1369.95 ml   BP 142/67  Pulse 77  Temp(Src) 98.7 F (37.1 C) (Oral)  Resp 16  Ht 5' 5.5" (1.664 m)  Wt 88.905 kg (196 lb)  BMI 32.12 kg/m2  SpO2 91% General appearance: alert, appears older than stated age, no distress and mildly obese  Lungs: clear to auscultation bilaterally  Heart: regular rate and rhythm, S1, S2 normal, no murmur, click, rub or gallop  Abdomen: soft, non-tender; bowel sounds normal; no masses, no organomegaly  Extremities: extremities normal, atraumatic, no cyanosis or edema   Lab Results: Basic Metabolic Panel:  Basename 12/22/10 0254 12/21/10 0454 12/20/10 0550  NA 135 135 --  K 3.2* 2.9* --  CL 98 97 --  CO2 30 33* --  GLUCOSE 110* 161* --  BUN 4* 7 --  CREATININE 0.98 0.98 --  CALCIUM 8.0* 8.0* --  MG -- -- 1.3*  PHOS -- -- --   CBC:  Basename 12/22/10 0254 12/21/10 0454  WBC 4.7 4.6  NEUTROABS -- --  HGB 9.3* 9.4*  HCT 30.1* 30.5*  MCV 83.1 83.3  PLT 128* 137*  Thyroid Function Tests:  Basename 12/20/10 0550  TSH 1.322  T4TOTAL --  FREET4 --  T3FREE --  THYROIDAB --   Ct Chest Wo Contrast  12/21/2010  IMPRESSION:  1.  Bilateral chest wall cellulitis. 2.  12 x 4 cm fluid collection in the subcutaneous fat on the right side could reflect a postoperative hematoma, seroma or abscess. 3.  No acute pulmonary findings.  Original Report Authenticated By: P. Loralie Champagne, M.D.   Medications: Scheduled Meds:    . docusate sodium  100 mg Oral BID  . DULoxetine  60 mg Oral BID  . enoxaparin  40 mg Subcutaneous Q24H  . fentaNYL   150 mcg Transdermal Q72H  . gabapentin  900 mg Oral TID  . metoCLOPramide  10 mg Oral BID  . pantoprazole  40 mg Oral Q1200  . piperacillin-tazobactam (ZOSYN)  IV  3.375 g Intravenous Q8H  . potassium chloride SA  20 mEq Oral BID  . traZODone  150 mg Oral QHS  . vancomycin  1,000 mg Intravenous Q12H  . vitamin A & D       Continuous Infusions:    . 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 As per the patient's wishes at consult infectious disease. Dr. Daiva Eves will come see the patient to clarify. If the patient indeed does have an infected Port-A-Cath, surgery is following and plan will be for patient to go to the OR tomorrow or Friday for removal.   CARCINOMA, BREAST (09/07/2009) Assessment: Status post mastectomy bilateral.  DIABETES MELLITUS, TYPE II (09/07/2009) Assessment: Following CBGs  HYPERLIPIDEMIA (09/07/2009) Assessment: Stable  PERSONALITY DISORDER (09/01/2009) Assessment: Continue home meds  Chronic  pain syndrome (09/07/2009) Assessment: No narcotics other than which is currently on.  MIGRAINE HEADACHE (09/07/2009) Assessment: Migraine medicine as needed, no narcotics  GERD (09/07/2009) Assessment: Continue PPI    Hypokalemia (12/21/2010)  replaced    LOS: 3 days   KRISHNAN,SENDIL K 12/22/2010, 1:57 PM

## 2010-12-22 NOTE — Progress Notes (Addendum)
ANTIBIOTIC CONSULT NOTE - FOLLOW UP  Pharmacy Consult for Vancomycin Indication: Sepsis Syndrome  Allergies  Allergen Reactions  . Infliximab Nausea And Vomiting  . Iohexol      Code: SOB   . Ketorolac Tromethamine     REACTION: but pt states not injectable  . Methotrexate Other (See Comments)    unknown  . Nalbuphine Other (See Comments)    unknown  . Nsaids Nausea And Vomiting  . Celecoxib Rash    Patient Measurements: Height: 5' 5.5" (166.4 cm) Weight: 196 lb (88.905 kg) IBW/kg (Calculated) : 58.15    Vital Signs: Temp: 99.7 F (37.6 C) (11/13 2145) Temp src: Oral (11/13 2145) BP: 155/79 mmHg (11/13 2145) Pulse Rate: 86  (11/13 2145) Intake/Output from previous day: 11/13 0701 - 11/14 0700 In: 1694 [P.O.:1425; I.V.:269] Out: 2754 [Urine:2750; Stool:4] Intake/Output from this shift: Total I/O In: 225 [P.O.:225] Out: 650 [Urine:650]  Labs:  [facility] 12/22/10 0254 12/21/10 0454 12/20/10 0550  WBC -- 4.6 6.8  HGB -- 9.4* 9.4*  PLT -- 137* 164  LABCREA -- -- --  CREATININE 0.98 0.98 1.01   Estimated Creatinine Clearance: 71.3 ml/min (by C-G formula based on Cr of 0.98).  Basename 12/22/10 0254  VANCOTROUGH 15.7  VANCOPEAK --  VANCORANDOM --  GENTTROUGH --  GENTPEAK --  GENTRANDOM --  TOBRATROUGH --  TOBRAPEAK --  TOBRARND --  AMIKACINPEAK --  AMIKACINTROU --  AMIKACIN --     Microbiology: Recent Results (from the past 720 hour(s))  CULTURE, BLOOD (ROUTINE X 2)     Status: Normal   Collection Time   11/26/10  2:20 AM      Component Value Range Status Comment   Specimen Description BLOOD LEFT PAC   Final    Special Requests BOTTLES DRAWN AEROBIC AND ANAEROBIC 6 CC EACH   Final    Setup Time [id number]   Final    Culture NO GROWTH 5 DAYS   Final    Report Status 12/02/2010 FINAL   Final   CULTURE, BLOOD (ROUTINE SINGLE)     Status: Normal   Collection Time   12/07/10 10:25 AM      Component Value Range Status Comment   Specimen  Description BLOOD   Final    Special Requests BOTTLES DRAWN AEROBIC AND ANAEROBIC   Final    Setup Time [id number]   Final    Culture NO GROWTH 5 DAYS   Final    Report Status 12/13/2010 FINAL   Final   CULTURE, BLOOD (SINGLE)     Status: Normal (Preliminary result)   Collection Time   12/20/10 12:10 AM      Component Value Range Status Comment   Specimen Description BLOOD LEFT CHEST PORT   Final    Special Requests BOTTLES DRAWN AEROBIC AND ANAEROBIC 5CC   Final    Setup Time [id number]   Final    Culture     Final    Value:        BLOOD CULTURE RECEIVED NO GROWTH TO DATE CULTURE WILL BE HELD FOR 5 DAYS BEFORE ISSUING A FINAL NEGATIVE REPORT   Report Status PENDING   Incomplete   CULTURE, BLOOD (SINGLE)     Status: Normal (Preliminary result)   Collection Time   12/20/10 12:20 AM      Component Value Range Status Comment   Specimen Description BLOOD R HAND   Final    Special Requests BOTTLES DRAWN AEROBIC ONLY 5CC   Final  Setup Time [phone]   Final    Culture     Final    Value:        BLOOD CULTURE RECEIVED NO GROWTH TO DATE CULTURE WILL BE HELD FOR 5 DAYS BEFORE ISSUING A FINAL NEGATIVE REPORT   Report Status PENDING   Incomplete   WOUND CULTURE     Status: Normal (Preliminary result)   Collection Time   12/20/10  9:45 AM      Component Value Range Status Comment   Specimen Description BREAST   Final    Special Requests NONE   Final    Gram Stain     Final    Value: RARE WBC PRESENT, PREDOMINANTLY MONONUCLEAR     NO SQUAMOUS EPITHELIAL CELLS SEEN     NO ORGANISMS SEEN   Culture NO GROWTH 1 DAY   Final    Report Status PENDING   Incomplete   MRSA PCR SCREENING     Status: Normal   Collection Time   12/20/10  9:45 AM      Component Value Range Status Comment   MRSA by PCR NEGATIVE  NEGATIVE  Final     Anti-infectives     Start     Dose/Rate Route Frequency Ordered Stop   12/20/10 1500   vancomycin (VANCOCIN) IVPB 1000 mg/200 mL premix        1,000 mg 200  mL/hr over 60 Minutes Intravenous Every 12 hours 12/20/10 0910     12/20/10 0300   vancomycin (VANCOCIN) IVPB 1000 mg/200 mL premix  Status:  Discontinued        1,000 mg 200 mL/hr over 60 Minutes Intravenous Every 8 hours 12/20/10 0256 12/20/10 0910   12/20/10 0150  piperacillin-tazobactam (ZOSYN) IVPB 3.375 g       3.375 g 12.5 mL/hr over 240 Minutes Intravenous 3 times per day 12/20/10 0150     12/19/10 2245   piperacillin-tazobactam (ZOSYN) IVPB 3.375 g  Status:  Discontinued        3.375 g 12.5 mL/hr over 240 Minutes Intravenous 3 times per day 12/19/10 2243 12/19/10 2247          Assessment: Trough = 15.7 mg/L   Goal of Therapy:  Vancomycin trough level 15-20 mcg/ml  Plan:  Continue Vancomycin  1Gm IV q12h. F/U SCr/further levels as needed.  Susanne Greenhouse R 12/22/2010,3:40 AM

## 2010-12-22 NOTE — Anesthesia Preprocedure Evaluation (Addendum)
Anesthesia Evaluation  Patient identified by MRN, date of birth, ID band Patient awake    Reviewed: Allergy & Precautions, H&P , NPO status , Patient's Chart, lab work & pertinent test results  Airway Mallampati: II TM Distance: >3 FB Neck ROM: Full    Dental No notable dental hx. 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   Chronic Narcotics  Renal/GU negative Renal ROS  Genitourinary negative   Musculoskeletal negative musculoskeletal ROS (+)   Abdominal Normal abdominal exam  (+)   Peds negative pediatric ROS (+)  Hematology negative hematology ROS (+) Anemia thrombocytopenia   Anesthesia Other Findings SLE Lyme disease Bil. mastectomies  Reproductive/Obstetrics negative OB ROS                     Anesthesia  Anesthesia Physical Anesthesia Plan  ASA: III  Anesthesia Plan: MAC   Post-op Pain Management:    Induction: Intravenous  Airway Management Planned: Simple Face Mask  Additional Equipment:   Intra-op Plan:   Post-operative Plan:   Informed Consent: I have reviewed the patients History and Physical, chart, labs and discussed the procedure including the risks, benefits and alternatives for the proposed anesthesia with the patient or authorized representative who has indicated his/her understanding and acceptance.   Dental advisory given  Plan Discussed with: CRNA and Surgeon  Anesthesia Plan Comments:         Anesthesia Quick Evaluation

## 2010-12-23 ENCOUNTER — Inpatient Hospital Stay (HOSPITAL_COMMUNITY): Payer: Medicare Other | Admitting: Anesthesiology

## 2010-12-23 ENCOUNTER — Other Ambulatory Visit: Payer: Self-pay

## 2010-12-23 ENCOUNTER — Encounter (HOSPITAL_COMMUNITY): Payer: Self-pay | Admitting: Anesthesiology

## 2010-12-23 ENCOUNTER — Encounter (HOSPITAL_COMMUNITY)
Admission: EM | Disposition: A | Discharge status: Home Health | Payer: Self-pay | Source: Home / Self Care | Attending: Internal Medicine

## 2010-12-23 ENCOUNTER — Encounter (HOSPITAL_COMMUNITY): Payer: Self-pay

## 2010-12-23 ENCOUNTER — Inpatient Hospital Stay (HOSPITAL_COMMUNITY): Payer: Medicare Other

## 2010-12-23 DIAGNOSIS — Z452 Encounter for adjustment and management of vascular access device: Secondary | ICD-10-CM

## 2010-12-23 DIAGNOSIS — R509 Fever, unspecified: Secondary | ICD-10-CM

## 2010-12-23 DIAGNOSIS — A419 Sepsis, unspecified organism: Secondary | ICD-10-CM

## 2010-12-23 HISTORY — PX: PORT-A-CATH REMOVAL: SHX5289

## 2010-12-23 SURGERY — REMOVAL PORT-A-CATH
Anesthesia: Monitor Anesthesia Care | Site: Chest | Laterality: Left | Wound class: Dirty or Infected

## 2010-12-23 MED ORDER — PROPOFOL 10 MG/ML IV EMUL
INTRAVENOUS | Status: DC | PRN
  Administered 2010-12-23: 180 mg via INTRAVENOUS

## 2010-12-23 MED ORDER — LIDOCAINE HCL (CARDIAC) 20 MG/ML IV SOLN
INTRAVENOUS | Status: DC | PRN
  Administered 2010-12-23: 100 mg via INTRAVENOUS

## 2010-12-23 MED ORDER — FENTANYL CITRATE 0.05 MG/ML IJ SOLN
INTRAMUSCULAR | Status: DC | PRN
  Administered 2010-12-23 (×2): 50 ug via INTRAVENOUS

## 2010-12-23 MED ORDER — HYDROMORPHONE HCL PF 1 MG/ML IJ SOLN
0.2500 mg | INTRAMUSCULAR | Status: DC | PRN
  Administered 2010-12-23 (×2): 0.25 mg via INTRAVENOUS

## 2010-12-23 MED ORDER — LACTATED RINGERS IV SOLN
INTRAVENOUS | Status: DC
  Administered 2010-12-23: 1000 mL via INTRAVENOUS

## 2010-12-23 MED ORDER — ONDANSETRON HCL 4 MG/2ML IJ SOLN
INTRAMUSCULAR | Status: DC | PRN
  Administered 2010-12-23: 4 mg via INTRAVENOUS

## 2010-12-23 MED ORDER — PROMETHAZINE HCL 25 MG/ML IJ SOLN: 6.2500 mg | INTRAMUSCULAR | Status: DC | PRN

## 2010-12-23 MED ORDER — FENTANYL CITRATE 0.05 MG/ML IJ SOLN
25.0000 ug | INTRAMUSCULAR | Status: DC | PRN
  Administered 2010-12-23: 50 ug via INTRAVENOUS
  Administered 2010-12-23 (×2): 25 ug via INTRAVENOUS

## 2010-12-23 SURGICAL SUPPLY — 32 items
APL SKNCLS STERI-STRIP NONHPOA (GAUZE/BANDAGES/DRESSINGS) ×1
BENZOIN TINCTURE PRP APPL 2/3 (GAUZE/BANDAGES/DRESSINGS) ×2 IMPLANT
BLADE HEX COATED 2.75 (ELECTRODE) ×2 IMPLANT
BLADE SURG 15 STRL LF DISP TIS (BLADE) ×2 IMPLANT
BLADE SURG 15 STRL SS (BLADE) ×2
CANISTER SUCTION 2500CC (MISCELLANEOUS) ×2 IMPLANT
CLOSURE STERI STRIP 1/2 X4 (GAUZE/BANDAGES/DRESSINGS) ×1 IMPLANT
CLOTH BEACON ORANGE TIMEOUT ST (SAFETY) ×2 IMPLANT
DECANTER SPIKE VIAL GLASS SM (MISCELLANEOUS) ×2 IMPLANT
DRAPE LAPAROTOMY TRNSV 102X78 (DRAPE) ×2 IMPLANT
DRSG TEGADERM 1.75X1.75 (GAUZE/BANDAGES/DRESSINGS) ×1 IMPLANT
ELECT REM PT RETURN 9FT ADLT (ELECTROSURGICAL) ×2
ELECTRODE REM PT RTRN 9FT ADLT (ELECTROSURGICAL) ×1 IMPLANT
GAUZE SPONGE 4X4 16PLY XRAY LF (GAUZE/BANDAGES/DRESSINGS) ×2 IMPLANT
GLOVE BIOGEL M 8.0 STRL (GLOVE) ×2 IMPLANT
GLOVE BIOGEL PI IND STRL 7.0 (GLOVE) ×1 IMPLANT
GLOVE BIOGEL PI INDICATOR 7.0 (GLOVE) ×1
GOWN STRL NON-REIN LRG LVL3 (GOWN DISPOSABLE) ×2 IMPLANT
GOWN STRL REIN XL XLG (GOWN DISPOSABLE) ×4 IMPLANT
KIT BASIN OR (CUSTOM PROCEDURE TRAY) ×2 IMPLANT
NDL HYPO 25X1 1.5 SAFETY (NEEDLE) ×1 IMPLANT
NEEDLE HYPO 25X1 1.5 SAFETY (NEEDLE) ×2 IMPLANT
NS IRRIG 1000ML POUR BTL (IV SOLUTION) ×2 IMPLANT
PACK BASIC VI WITH GOWN DISP (CUSTOM PROCEDURE TRAY) ×2 IMPLANT
PEN SKIN MARKING BROAD (MISCELLANEOUS) ×2 IMPLANT
PENCIL BUTTON HOLSTER BLD 10FT (ELECTRODE) ×2 IMPLANT
SPONGE GAUZE 4X4 12PLY (GAUZE/BANDAGES/DRESSINGS) ×2 IMPLANT
STRIP CLOSURE SKIN 1/2X4 (GAUZE/BANDAGES/DRESSINGS) ×2 IMPLANT
SUT VIC AB 4-0 SH 18 (SUTURE) ×2 IMPLANT
SYR CONTROL 10ML LL (SYRINGE) ×2 IMPLANT
TAPE CLOTH SURG 4X10 WHT LF (GAUZE/BANDAGES/DRESSINGS) ×1 IMPLANT
YANKAUER SUCT BULB TIP 10FT TU (MISCELLANEOUS) ×2 IMPLANT

## 2010-12-23 NOTE — Anesthesia Postprocedure Evaluation (Signed)
  Anesthesia Post-op Note  Patient: [name] Carr  Procedure(s) Performed:  REMOVAL PORT-A-CATH  Patient Location: PACU  Anesthesia Type: General  Level of Consciousness: awake and alert   Airway and Oxygen Therapy: Patient Spontanous Breathing  Post-op Pain: mild  Post-op Assessment: Post-op Vital signs reviewed, Patient's Cardiovascular Status Stable, Respiratory Function Stable, Patent Airway and No signs of Nausea or vomiting  Post-op Vital Signs: stable  Complications: No apparent anesthesia complications

## 2010-12-23 NOTE — Progress Notes (Signed)
Subjective: Patient status post Port-A-Cath removal. Seen briefly in recovery. At that time resting comfortably. No complaints. Upon arrival to floor, nursing called noting no IV access. Unable to place peripheral. Unable to get PICC line because of bilateral mastectomy. Called CCM who attempted to place a right IJ catheter. Very limited and tenuous IJ placed given clotted off catheter unable to thread full catheter through.  Objective: Weight change:   Intake/Output Summary (Last 24 hours) at 12/23/10 1838 Last data filed at 12/23/10 1500  Gross per 24 hour  Intake 2481.5 ml  Output    901 ml  Net 1580.5 ml   BP 130/77  Pulse 97  Temp(Src) 99.6 F (37.6 C) (Oral)  Resp 18  Ht 5' 5.5" (1.664 m)  Wt 88.905 kg (196 lb)  BMI 32.12 kg/m2  SpO2 92% General appearance: alert, appears older than stated age, no distress and mildly obese  Lungs: clear to auscultation bilaterally  Heart: regular rate and rhythm, S1, S2 normal, no murmur, click, rub or gallop  Abdomen: soft, non-tender; bowel sounds normal; no masses, no organomegaly  Extremities: extremities normal, atraumatic, no cyanosis or edema   Lab Results: Basic Metabolic Panel:  Basename 12/22/10 0254 12/21/10 0454  NA 135 135  K 3.2* 2.9*  CL 98 97  CO2 30 33*  GLUCOSE 110* 161*  BUN 4* 7  CREATININE 0.98 0.98  CALCIUM 8.0* 8.0*  MG -- --  PHOS -- --   CBC:  Basename 12/22/10 0254 12/21/10 0454  WBC 4.7 4.6  NEUTROABS -- --  HGB 9.3* 9.4*  HCT 30.1* 30.5*  MCV 83.1 83.3  PLT 128* 137*    Medications: Scheduled Meds:    . ceFEPIme (MAXIPIME) 2 GM IVP  2 g Intravenous Q12H  . docusate sodium  100 mg Oral BID  . DULoxetine  60 mg Oral BID  . fentaNYL  150 mcg Transdermal Q72H  . gabapentin  900 mg Oral TID  . metoCLOPramide  10 mg Oral BID  . pantoprazole  40 mg Oral Q1200  . potassium chloride SA  20 mEq Oral BID  . traZODone  150 mg Oral QHS  . DISCONTD: enoxaparin  40 mg Subcutaneous Q24H    Continuous Infusions:    . 0.9 % NaCl with KCl 20 mEq / L 75 mL/hr at 12/23/10 1816  . 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 Port-A-Cath removed. Infectious disease following. Most immediate issue is with IV access. Need to discuss with surgery and infectious disease as to how soon new Port-A-Cath can be placed.   CARCINOMA, BREAST (09/07/2009) Assessment: Status post mastectomy bilateral.  DIABETES MELLITUS, TYPE II (09/07/2009) Assessment: Following CBGs  HYPERLIPIDEMIA (09/07/2009) Assessment: Stable  PERSONALITY DISORDER (09/01/2009) Assessment: Continue home meds  Chronic pain syndrome (09/07/2009) Assessment: No narcotics other than which is currently on.  MIGRAINE HEADACHE (09/07/2009) Assessment: Migraine medicine as needed, no narcotics  GERD (09/07/2009) Assessment: Continue PPI    Hypokalemia (12/21/2010)  replaced    LOS: 4 days   [name] Carr K 12/23/2010, 6:38 PM

## 2010-12-23 NOTE — Plan of Care (Signed)
Problem: Phase II Progression Outcomes Goal: Other Phase II Outcomes/Goals Outcome: Completed/Met Date Met:  12/23/10 Talked about procedure to remove pAC that will take place today

## 2010-12-23 NOTE — Addendum Note (Signed)
Addendum  created 12/23/10 1515 by Riesa Pope, MD   Modules edited:Notes Section, Orders, PRL Based Order Sets

## 2010-12-23 NOTE — Transfer of Care (Signed)
Immediate Anesthesia Transfer of Care Note  Patient: [name] Carr  Procedure(s) Performed:  REMOVAL PORT-A-CATH  Patient Location: PACU  Anesthesia Type: General  Level of Consciousness: awake and alert   Airway & Oxygen Therapy: Patient Spontanous Breathing and Patient connected to face mask oxygen  Post-op Assessment: Report given to PACU RN and Post -op Vital signs reviewed and stable  Post vital signs: Reviewed and stable  Complications: No apparent anesthesia complications

## 2010-12-23 NOTE — Procedures (Signed)
Central Venous Catheter Insertion Procedure Note [name] Carr [id number] 04/01/1955  Procedure: Insertion of Central Venous Catheter Indications: Drug and/or fluid administration  Procedure Details Consent: Risks of procedure as well as the alternatives and risks of each were explained to the (patient/caregiver).  Consent for procedure obtained. Time Out: Verified patient identification, verified procedure, site/side was marked, verified correct patient position, special equipment/implants available, medications/allergies/relevent history reviewed, required imaging and test results available.  Performed  Maximum sterile technique was used including antiseptics, cap, gloves, gown, hand hygiene, mask and sheet. Skin prep: Chlorhexidine; local anesthetic administered A antimicrobial bonded/coated single lumen catheter was placed in the right internal jugular vein using the Seldinger technique.  Evaluation Blood flow good Complications: No apparent complications Patient did tolerate procedure well. Chest X-ray ordered to verify placement.  CXR: pending   ###### Unable to pass wire or central line due to obstruction.  Vessel easily cannulated with good blood flow.  Small catheter sutured in place for temporary access. ######  [name] Brim, NP-C Pgr: [phone]   I was present for and supervised the entire procedure. Discussed with Dr Rito Ehrlich. She will need replacement of portacath as soon as feasible   Billy Fischer, MD;  PCCM service; Mobile [phone]

## 2010-12-23 NOTE — Anesthesia Postprocedure Evaluation (Deleted)
  Anesthesia Post-op Note  Patient: [name] Carr  Procedure(s) Performed:  REMOVAL PORT-A-CATH  Patient Location: PACU  Anesthesia Type: MAC  Level of Consciousness: awake and alert   Airway and Oxygen Therapy: Patient Spontanous Breathing  Post-op Pain: mild  Post-op Assessment: Post-op Vital signs reviewed, Patient's Cardiovascular Status Stable, Respiratory Function Stable, Patent Airway and No signs of Nausea or vomiting  Post-op Vital Signs: stable  Complications: No apparent anesthesia complications

## 2010-12-23 NOTE — Op Note (Signed)
Procedure: Explantation of power port Anesthesia: Gen. Surgeon: Daphine Deutscher Description of procedure: This 55 year old lady who is status post recent left mastectomy with a indwelling Port-A-Cath and sepsis. She is followed by Dr. Johna Sheriff and needed her Port-A-Cath removed for infection control. She was seen by me in the holding area and Dr. Johna Sheriff had previously given her informed consent regarding its removal.  He was taken to room 11 in given anesthesia. She was prepped with PCMX draped sterilely. A timeout was performed. Transverse incision was made in her previous incision and the power port was mobilized and the 2 cephalad sutures were removed and it was explanted in toto. Cultures were obtained from the pocket as well as the tip of the device was cut and placed in the aerobic culture medium. The incision was then closed with subcuticular 4-0 Vicryl with benzoin and Steri-Strips. Patient was taken to recovery room be transferred back to the floor. Condition stable

## 2010-12-24 ENCOUNTER — Encounter (INDEPENDENT_AMBULATORY_CARE_PROVIDER_SITE_OTHER): Payer: Medicare Other | Admitting: General Surgery

## 2010-12-24 ENCOUNTER — Telehealth (INDEPENDENT_AMBULATORY_CARE_PROVIDER_SITE_OTHER): Payer: Self-pay | Admitting: General Surgery

## 2010-12-24 ENCOUNTER — Inpatient Hospital Stay (HOSPITAL_COMMUNITY): Payer: Medicare Other

## 2010-12-24 ENCOUNTER — Encounter (HOSPITAL_COMMUNITY): Payer: Self-pay | Admitting: Surgery

## 2010-12-24 DIAGNOSIS — M542 Cervicalgia: Secondary | ICD-10-CM | POA: Diagnosis not present

## 2010-12-24 MED ORDER — HYDROMORPHONE HCL PF 1 MG/ML IJ SOLN
1.0000 mg | INTRAMUSCULAR | Status: DC | PRN
  Administered 2010-12-24 – 2010-12-25 (×12): 1 mg via INTRAVENOUS
  Filled 2010-12-24 (×13): qty 1

## 2010-12-24 MED ORDER — ENOXAPARIN SODIUM 40 MG/0.4ML ~~LOC~~ SOLN
40.0000 mg | SUBCUTANEOUS | Status: DC
  Administered 2010-12-24: 40 mg via SUBCUTANEOUS
  Filled 2010-12-24 (×2): qty 0.4

## 2010-12-24 NOTE — Telephone Encounter (Signed)
done

## 2010-12-24 NOTE — Progress Notes (Signed)
Subjective: C/o neck pain esp with flexion  Objective: Weight change:   Intake/Output Summary (Last 24 hours) at 12/24/10 1516 Last data filed at 12/24/10 1300  Gross per 24 hour  Intake   2575 ml  Output   2250 ml  Net    325 ml   Blood pressure 134/77, pulse 99, temperature 99.6 F (37.6 C), temperature source Oral, resp. rate 16, height 5' 5.5" (1.664 m), weight 196 lb (88.905 kg), SpO2 93.00%. Temp:  [97 F (36.1 C)-99.6 F (37.6 C)] 99.6 F (37.6 C) (11/16 1304) Pulse Rate:  [97-113] 99  (11/16 1304) Resp:  [16-18] 16  (11/16 1304) BP: (130-144)/(75-77) 134/77 mmHg (11/16 1304) SpO2:  [90 %-93 %] 93 % (11/16 1304)  Physical Exam: General: Alert and awake, oriented x3, not in any acute distress. HEENT: anicteric sclera, pupils reactive to light and accommodation, Neck with central line, pain with flexion. No palpable mass, no fluctuance   CVS regular rate, normal r,  no murmur rubs or gallops Chest: clear to auscultation bilaterally, no wheezing, rales or rhonchi Abdomen: soft nontender, nondistended, normal bowel sounds, Extremities: no  clubbing or edema noted bilaterally Skin: portacath site bandaged Lymph: no new lymphadenopathy Neuro: nonfocal  Lab Results:  Basename 12/22/10 0254  WBC 4.7  HGB 9.3*  HCT 30.1*  PLT 128*   BMET  Basename 12/22/10 0254  NA 135  K 3.2*  CL 98  CO2 30  GLUCOSE 110*  BUN 4*  CREATININE 0.98  CALCIUM 8.0*    Micro Results: Recent Results (from the past 240 hour(s))  CULTURE, BLOOD (SINGLE)     Status: Normal (Preliminary result)   Collection Time   12/20/10 12:10 AM      Component Value Range Status Comment   Specimen Description BLOOD LEFT CHEST PORT   Final    Special Requests BOTTLES DRAWN AEROBIC AND ANAEROBIC 5CC   Final    Setup Time [id number]   Final    Culture     Final    Value:        BLOOD CULTURE RECEIVED NO GROWTH TO DATE CULTURE WILL BE HELD FOR 5 DAYS BEFORE ISSUING A FINAL NEGATIVE REPORT   Report Status PENDING   Incomplete   CULTURE, BLOOD (SINGLE)     Status: Normal (Preliminary result)   Collection Time   12/20/10 12:20 AM      Component Value Range Status Comment   Specimen Description BLOOD R HAND   Final    Special Requests BOTTLES DRAWN AEROBIC ONLY 5CC   Final    Setup Time [id number]   Final    Culture     Final    Value:        BLOOD CULTURE RECEIVED NO GROWTH TO DATE CULTURE WILL BE HELD FOR 5 DAYS BEFORE ISSUING A FINAL NEGATIVE REPORT   Report Status PENDING   Incomplete   WOUND CULTURE     Status: Normal   Collection Time   12/20/10  9:45 AM      Component Value Range Status Comment   Specimen Description BREAST   Final    Special Requests NONE   Final    Gram Stain     Final    Value: RARE WBC PRESENT, PREDOMINANTLY MONONUCLEAR     NO SQUAMOUS EPITHELIAL CELLS SEEN     NO ORGANISMS SEEN   Culture NO GROWTH 2 DAYS   Final    Report Status 12/22/2010 FINAL   Final  MRSA PCR SCREENING     Status: Normal   Collection Time   12/20/10  9:45 AM      Component Value Range Status Comment   MRSA by PCR NEGATIVE  NEGATIVE  Final   WOUND CULTURE (INCLUDES ANAEROBIC)     Status: Normal (Preliminary result)   Collection Time   12/23/10  2:05 PM      Component Value Range Status Comment   Specimen Description WOUND LEFT CHEST PORT A CATH POCKET   Final    Special Requests PATIENT ON FOLLOWING ZOSYN   Final    Gram Stain     Final    Value: RARE WBC PRESENT,BOTH PMN AND MONONUCLEAR     NO SQUAMOUS EPITHELIAL CELLS SEEN     NO ORGANISMS SEEN   Culture NO GROWTH   Final    Report Status PENDING   Incomplete   ANAEROBIC CULTURE     Status: Normal (Preliminary result)   Collection Time   12/23/10  2:05 PM      Component Value Range Status Comment   Specimen Description WOUND LEFT CHEST PORT A CATH POCKET   Final    Special Requests PATIENT ON FOLLOWING ZOSYN   Final    Gram Stain     Final    Value: RARE WBC PRESENT,BOTH PMN AND MONONUCLEAR     NO SQUAMOUS  EPITHELIAL CELLS SEEN     NO ORGANISMS SEEN   Culture     Final    Value: NO ANAEROBES ISOLATED; CULTURE IN PROGRESS FOR 5 DAYS   Report Status PENDING   Incomplete   CATH TIP CULTURE     Status: Normal (Preliminary result)   Collection Time   12/23/10  2:05 PM      Component Value Range Status Comment   Specimen Description CATH TIP LEFT CHEST PORT A CATH   Final    Special Requests PATIENT ON FOLLOWING ZOSYN   Final    Culture NO GROWTH   Final    Report Status PENDING   Incomplete    12/20/2010: BLOOD CULTURE #1 Pantoea agglomerans Sensitive to cefepime 12/20/2010 BLOOD CULTURE NO GROWTH Studies/Results: Dg Chest 2 View  11/26/2010  *RADIOLOGY REPORT*  Clinical Data: Fever over 101.  Mid chest pain.  Bilateral mastectomies on 11/19/2010.  CHEST - 2 VIEW  Comparison: 11/19/2010  Findings: Normal heart size and pulmonary vascularity.  No focal airspace consolidation in the lungs.  No atelectasis.  No pneumothorax.  No pleural effusions.  Power port Infuse-A-Port with tip over the upper SVC region.  Skin clips in the anterior chest wall.  There is an air-fluid level in the subcutaneous soft tissues over the right lateral chest wall.  This could represent postoperative fluid collection of subcutaneous abscess should be considered, given the patient's history of fevers.  IMPRESSION: No evidence of active pulmonary disease.  Air-fluid levels in the subcutaneous tissues of the right lateral anterior chest wall may represent postoperative fluid collection or soft tissue abscess.  Original Report Authenticated By: Marlon Pel, M.D.   Ct Chest Wo Contrast  12/21/2010  *RADIOLOGY REPORT*  Clinical Data: Status post bilateral mastectomy with cellulitis and fever.  CT CHEST WITHOUT CONTRAST  Technique:  Multidetector CT imaging of the chest was performed following the standard protocol without IV contrast.  Comparison: None  Findings: There are skin staples and surgical changes from bilateral  mastectomies.  Bilateral interstitial changes in the subcutaneous fat suggesting cellulitis.  There is a large  postoperative fluid collection on the right side measuring 12 x 4 cm.  This could be a postoperative liquefied hematoma but cannot exclude an abscess.  The Port-A-Cath is been good position without complicating features.  The underlying bony thorax is intact.  The heart is normal in size.  No pericardial effusion.  No mediastinal or hilar lymphadenopathy.  The esophagus is grossly normal.  Examination of the lung parenchyma demonstrates no acute pulmonary findings.  No infiltrates, effusions or edema.  No worrisome pulmonary nodules or masses.  The upper abdomen is unremarkable.  The gallbladder is contracted.  IMPRESSION:  1.  Bilateral chest wall cellulitis. 2.  12 x 4 cm fluid collection in the subcutaneous fat on the right side could reflect a postoperative hematoma, seroma or abscess. 3.  No acute pulmonary findings.  Original Report Authenticated By: P. Loralie Champagne, M.D.   Dg Chest Port 1v Same Day  12/23/2010  *RADIOLOGY REPORT*  Clinical Data: Status post attempted central line placement.  PORTABLE CHEST - 1 VIEW SAME DAY  Comparison: Chest x-ray 11/26/2010.  Findings: Port-A-Cath has been removed.  Surgical staples are seen over the right and left chest.  There is mild left basilar atelectasis.  There is no visible pneumothorax.  IMPRESSION: No visible pneumothorax.  Mild left basilar atelectasis.  Original Report Authenticated By: Elsie Stain, M.D.    Antibiotics:  Anti-infectives     Start     Dose/Rate Route Frequency Ordered Stop   12/22/10 1700   ceFEPIme (MAXIPIME) 2 g in dextrose 5 % 50 mL IVPB        2 g 100 mL/hr over 30 Minutes Intravenous Every 12 hours 12/22/10 1612     12/20/10 1500   vancomycin (VANCOCIN) IVPB 1000 mg/200 mL premix  Status:  Discontinued        1,000 mg 200 mL/hr over 60 Minutes Intravenous Every 12 hours 12/20/10 0910 12/22/10 1539   12/20/10  0300   vancomycin (VANCOCIN) IVPB 1000 mg/200 mL premix  Status:  Discontinued        1,000 mg 200 mL/hr over 60 Minutes Intravenous Every 8 hours 12/20/10 0256 12/20/10 0910   12/20/10 0150   piperacillin-tazobactam (ZOSYN) IVPB 3.375 g  Status:  Discontinued        3.375 g 12.5 mL/hr over 240 Minutes Intravenous 3 times per day 12/20/10 0150 12/22/10 1539   12/19/10 2245   piperacillin-tazobactam (ZOSYN) IVPB 3.375 g  Status:  Discontinued        3.375 g 12.5 mL/hr over 240 Minutes Intravenous 3 times per day 12/19/10 2243 12/19/10 2247          Medications: Scheduled Meds:   . ceFEPIme (MAXIPIME) 2 GM IVP  2 g Intravenous Q12H  . docusate sodium  100 mg Oral BID  . DULoxetine  60 mg Oral BID  . fentaNYL  150 mcg Transdermal Q72H  . gabapentin  900 mg Oral TID  . metoCLOPramide  10 mg Oral BID  . pantoprazole  40 mg Oral Q1200  . potassium chloride SA  20 mEq Oral BID  . traZODone  150 mg Oral QHS   Continuous Infusions:   . 0.9 % NaCl with KCl 20 mEq / L 20 mL/hr at 12/24/10 1316  . DISCONTD: lactated ringers 1,000 mL (12/23/10 1309)   PRN Meds:.acetaminophen, acetaminophen, carisoprodol, clonazePAM, HYDROcodone-acetaminophen, HYDROmorphone (DILAUDID) injection, ondansetron (ZOFRAN) IV, ondansetron, promethazine, promethazine, DISCONTD: fentaNYL, DISCONTD: HYDROmorphone, DISCONTD:  HYDROmorphone (DILAUDID) injection, DISCONTD: promethazine  Assessment/Plan: [name] Guile  Carr is a 56 y.o. 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 She was readmitted with fevers on antibiotics. One of two blood cultures at [facility] grew a Pantoea agglomerans (formerly known as enterobacter agglomerans), cultures here at [facility] from blood and from portacath after removal are without growth  Portacath infection: COag negative Staph is likely the sole culprit, however we will keep her on broader coverage with cefepime given the Pantoea agglomerans  that  grew while we awiat further data from intraoperative cultures Agree with imaging the neck given pain and recent bacteremia --would also image L spine with MRI --check 2decho --would treat for at least 4 weeks of effective IV antibiotics (likely with ancef) for the CNS  Bacteremia: --see above discussion and plan    LOS: 5 days   Acey Lav 12/24/2010, 3:16 PM

## 2010-12-24 NOTE — Progress Notes (Signed)
Interval History: [name] Carr 55 year old female who was admitted on 12/19/2010 with fever altered mental status in the setting of being treated for a staph surgical wound infection. She had been on IV vancomycin at home when she developed a fever as high as 103.8. She was placed on broad-spectrum antibiotics. A CT scan of her chest was done on 12/21/2010 which showed bilateral chest wall cellulitis and a 12x4 centimeter fluid collection in the subcutaneous fat on the right. General surgery and the infectious disease physicians were subsequently consulted. The patient had her Port-A-Cath removed on 12/23/2010. She has been stable postoperatively. ROS: [name] Carr complains of neck pain. She states the neck pain started about 3 days ago. She has difficulty breathing when she is lying flat and states the pain is worse when lying flat. He also continues to have significant pain and required a dosage increase in her dilaudid from every 4 hours when necessary to every 2 hours when necessary. Her appetite is good.   Objective: Vital signs in last 24 hours: Temp:  [97 F (36.1 C)-99.6 F (37.6 C)] 98.3 F (36.8 C) (11/16 0500) Pulse Rate:  [97-113] 113  (11/16 0500) Resp:  [17-20] 18  (11/16 0500) BP: (124-164)/(75-85) 137/77 mmHg (11/16 0500) SpO2:  [90 %-98 %] 91 % (11/16 0500) Weight change:  Last BM Date: 12/22/10  Intake/Output from previous day:  Intake/Output Summary (Last 24 hours) at 12/24/10 1224 Last data filed at 12/24/10 0900  Gross per 24 hour  Intake 3866.5 ml  Output   1850 ml  Net 2016.5 ml     Physical Exam:  Gen:  No acute distress. Neck: No thyromegaly or lymphadenopathy. No palpable masses. IJ line present in the right neck. Cardiovascular:  You were rate, and rhythm. No murmurs, rubs, or gallops. Respiratory: Lungs clear to auscultation bilaterally. Gastrointestinal: Abdomen soft, nontender, nondistended with normal active bowel sounds. Extremities: 2+ pitting edema  bilaterally.   Lab Results: Results for orders placed during the hospital encounter of 12/19/10 (from the past 24 hour(s))  WOUND CULTURE (INCLUDES ANAEROBIC)     Status: Normal (Preliminary result)   Collection Time   12/23/10  2:05 PM      Component Value Range   Specimen Description WOUND LEFT CHEST PORT A CATH POCKET     Special Requests PATIENT ON FOLLOWING ZOSYN     Gram Stain       Value: RARE WBC PRESENT,BOTH PMN AND MONONUCLEAR     NO SQUAMOUS EPITHELIAL CELLS SEEN     NO ORGANISMS SEEN   Culture NO GROWTH     Report Status PENDING    ANAEROBIC CULTURE     Status: Normal (Preliminary result)   Collection Time   12/23/10  2:05 PM      Component Value Range   Specimen Description WOUND LEFT CHEST PORT A CATH POCKET     Special Requests PATIENT ON FOLLOWING ZOSYN     Gram Stain       Value: RARE WBC PRESENT,BOTH PMN AND MONONUCLEAR     NO SQUAMOUS EPITHELIAL CELLS SEEN     NO ORGANISMS SEEN   Culture       Value: NO ANAEROBES ISOLATED; CULTURE IN PROGRESS FOR 5 DAYS   Report Status PENDING    CATH TIP CULTURE     Status: Normal (Preliminary result)   Collection Time   12/23/10  2:05 PM      Component Value Range   Specimen Description CATH TIP LEFT CHEST PORT  A CATH     Special Requests PATIENT ON FOLLOWING ZOSYN     Culture NO GROWTH     Report Status PENDING     Recent Results (from the past 240 hour(s))  CULTURE, BLOOD (SINGLE)     Status: Normal (Preliminary result)   Collection Time   12/20/10 12:10 AM      Component Value Range Status Comment   Specimen Description BLOOD LEFT CHEST PORT   Final    Special Requests BOTTLES DRAWN AEROBIC AND ANAEROBIC 5CC   Final    Setup Time [id number]   Final    Culture     Final    Value:        BLOOD CULTURE RECEIVED NO GROWTH TO DATE CULTURE WILL BE HELD FOR 5 DAYS BEFORE ISSUING A FINAL NEGATIVE REPORT   Report Status PENDING   Incomplete   CULTURE, BLOOD (SINGLE)     Status: Normal (Preliminary result)    Collection Time   12/20/10 12:20 AM      Component Value Range Status Comment   Specimen Description BLOOD R HAND   Final    Special Requests BOTTLES DRAWN AEROBIC ONLY 5CC   Final    Setup Time [id number]   Final    Culture     Final    Value:        BLOOD CULTURE RECEIVED NO GROWTH TO DATE CULTURE WILL BE HELD FOR 5 DAYS BEFORE ISSUING A FINAL NEGATIVE REPORT   Report Status PENDING   Incomplete   WOUND CULTURE     Status: Normal   Collection Time   12/20/10  9:45 AM      Component Value Range Status Comment   Specimen Description BREAST   Final    Special Requests NONE   Final    Gram Stain     Final    Value: RARE WBC PRESENT, PREDOMINANTLY MONONUCLEAR     NO SQUAMOUS EPITHELIAL CELLS SEEN     NO ORGANISMS SEEN   Culture NO GROWTH 2 DAYS   Final    Report Status 12/22/2010 FINAL   Final   MRSA PCR SCREENING     Status: Normal   Collection Time   12/20/10  9:45 AM      Component Value Range Status Comment   MRSA by PCR NEGATIVE  NEGATIVE  Final   WOUND CULTURE (INCLUDES ANAEROBIC)     Status: Normal (Preliminary result)   Collection Time   12/23/10  2:05 PM      Component Value Range Status Comment   Specimen Description WOUND LEFT CHEST PORT A CATH POCKET   Final    Special Requests PATIENT ON FOLLOWING ZOSYN   Final    Gram Stain     Final    Value: RARE WBC PRESENT,BOTH PMN AND MONONUCLEAR     NO SQUAMOUS EPITHELIAL CELLS SEEN     NO ORGANISMS SEEN   Culture NO GROWTH   Final    Report Status PENDING   Incomplete   ANAEROBIC CULTURE     Status: Normal (Preliminary result)   Collection Time   12/23/10  2:05 PM      Component Value Range Status Comment   Specimen Description WOUND LEFT CHEST PORT A CATH POCKET   Final    Special Requests PATIENT ON FOLLOWING ZOSYN   Final    Gram Stain     Final    Value: RARE WBC PRESENT,BOTH PMN AND MONONUCLEAR  NO SQUAMOUS EPITHELIAL CELLS SEEN     NO ORGANISMS SEEN   Culture     Final    Value: NO ANAEROBES ISOLATED;  CULTURE IN PROGRESS FOR 5 DAYS   Report Status PENDING   Incomplete   CATH TIP CULTURE     Status: Normal (Preliminary result)   Collection Time   12/23/10  2:05 PM      Component Value Range Status Comment   Specimen Description CATH TIP LEFT CHEST PORT A CATH   Final    Special Requests PATIENT ON FOLLOWING ZOSYN   Final    Culture NO GROWTH   Final    Report Status PENDING   Incomplete     Studies/Results: Dg Chest Port 1v Same Day  12/23/2010  *RADIOLOGY REPORT*  Clinical Data: Status post attempted central line placement.  PORTABLE CHEST - 1 VIEW SAME DAY  Comparison: Chest x-ray 11/26/2010.  Findings: Port-A-Cath has been removed.  Surgical staples are seen over the right and left chest.  There is mild left basilar atelectasis.  There is no visible pneumothorax.  IMPRESSION: No visible pneumothorax.  Mild left basilar atelectasis.  Original Report Authenticated By: Elsie Stain, M.D.    Medications: Scheduled Meds:   . ceFEPIme (MAXIPIME) 2 GM IVP  2 g Intravenous Q12H  . docusate sodium  100 mg Oral BID  . DULoxetine  60 mg Oral BID  . fentaNYL  150 mcg Transdermal Q72H  . gabapentin  900 mg Oral TID  . metoCLOPramide  10 mg Oral BID  . pantoprazole  40 mg Oral Q1200  . potassium chloride SA  20 mEq Oral BID  . traZODone  150 mg Oral QHS   Continuous Infusions:   . 0.9 % NaCl with KCl 20 mEq / L 75 mL/hr at 12/23/10 1816  . 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 Her Port-A-Cath was removed yesterday. Will followup with culture results. Active  Problems:  CARCINOMA, BREAST The patient is status post bilateral mastectomies. She can followup with her oncologist post discharge.  DIABETES MELLITUS, TYPE II The patient was not taking any diabetic medications prior to admission. Her fasting glucoses on her daily being that have ranged from 110-161. I suspect she is diet controlled. Check a hemoglobin A1c.  HYPERLIPIDEMIA Patient is not currently on any medications to address this.  PERSONALITY DISORDER Continue Klonopin for anxiety.  Chronic pain syndrome Continue pain medications for pain control.  MIGRAINE HEADACHE Continue to monitor.  GERD No active complaints at this time.  Immunocompromised patient The patient is not currently neutropenic.  Hypokalemia The patient's potassium was 3.2 on 12/22/2010. She is on routine supplementation therapy.  Neck Pain We'll obtain a CT scan of the patient's neck given her history of malignancy.  Lower extremity edema KVO IVFs.    LOS: 5 days   Hillery Aldo, MD Pager [phone]  12/24/2010, 12:24 PM

## 2010-12-25 DIAGNOSIS — M79609 Pain in unspecified limb: Secondary | ICD-10-CM

## 2010-12-25 DIAGNOSIS — M7989 Other specified soft tissue disorders: Secondary | ICD-10-CM

## 2010-12-25 MED ORDER — ALUM & MAG HYDROXIDE-SIMETH 200-200-20 MG/5ML PO SUSP
30.0000 mL | Freq: Four times a day (QID) | ORAL | Status: DC | PRN
  Administered 2010-12-25 – 2010-12-26 (×2): 30 mL via ORAL
  Filled 2010-12-25 (×4): qty 30

## 2010-12-25 MED ORDER — HYDROMORPHONE HCL PF 1 MG/ML IJ SOLN: 1.0000 mg | INTRAMUSCULAR | Status: DC | PRN

## 2010-12-25 MED ORDER — PREDNISONE 50 MG PO TABS
50.0000 mg | ORAL_TABLET | Freq: Four times a day (QID) | ORAL | Status: AC
  Administered 2010-12-25 – 2010-12-26 (×3): 50 mg via ORAL
  Filled 2010-12-25 (×3): qty 1

## 2010-12-25 MED ORDER — SODIUM CHLORIDE 0.9 % IJ SOLN
3.0000 mL | Freq: Two times a day (BID) | INTRAMUSCULAR | Status: DC
  Administered 2010-12-26 (×2): 3 mL via INTRAVENOUS

## 2010-12-25 MED ORDER — HYDROMORPHONE HCL PF 2 MG/ML IJ SOLN
INTRAMUSCULAR | Status: AC
  Administered 2010-12-25: 1 mg via INTRAVENOUS
  Filled 2010-12-25: qty 1

## 2010-12-25 MED ORDER — DIPHENHYDRAMINE HCL 50 MG PO CAPS
50.0000 mg | ORAL_CAPSULE | Freq: Once | ORAL | Status: AC
  Administered 2010-12-26: 50 mg via ORAL
  Filled 2010-12-25: qty 1

## 2010-12-25 MED ORDER — ENOXAPARIN SODIUM 100 MG/ML ~~LOC~~ SOLN
1.0000 mg/kg | Freq: Two times a day (BID) | SUBCUTANEOUS | Status: DC
  Administered 2010-12-25 – 2010-12-27 (×4): 90 mg via SUBCUTANEOUS
  Administered 2010-12-28: 04:00:00 via SUBCUTANEOUS
  Filled 2010-12-25 (×8): qty 1

## 2010-12-25 MED ORDER — FUROSEMIDE 20 MG PO TABS
20.0000 mg | ORAL_TABLET | Freq: Every day | ORAL | Status: AC
  Administered 2010-12-25: 20 mg via ORAL
  Filled 2010-12-25: qty 1

## 2010-12-25 MED ORDER — SODIUM CHLORIDE 0.9 % IJ SOLN: 3.0000 mL | INTRAMUSCULAR | Status: DC | PRN

## 2010-12-25 MED ORDER — SPIRONOLACTONE-HCTZ 25-25 MG PO TABS
1.0000 | ORAL_TABLET | Freq: Every day | ORAL | Status: DC
  Filled 2010-12-25: qty 1

## 2010-12-25 MED ORDER — SODIUM CHLORIDE 0.9 % IV SOLN: 250.0000 mL | INTRAVENOUS | Status: DC

## 2010-12-25 NOTE — Progress Notes (Signed)
  2 Days Post-Op  Subjective: Some swelling and heat left arm.  Objective: Vital signs in last 24 hours: Temp:  [98.6 F (37 C)-99.6 F (37.6 C)] 98.6 F (37 C) (11/17 0500) Pulse Rate:  [86-99] 86  (11/17 0500) Resp:  [16-18] 16  (11/17 0500) BP: (95-146)/(66-77) 95/66 mmHg (11/17 0500) SpO2:  [93 %-96 %] 96 % (11/17 0500) Last BM Date: 12/22/10  Intake/Output from previous day: 11/16 0701 - 11/17 0700 In: 2461 [P.O.:1765; I.V.:396; IV Piggyback:300] Out: 1600 [Urine:1600] Intake/Output this shift:    General appearance: alert and no distress Extremities: Mild swelling, warmth LUE, no erythema Incision/Wound:Mastectomy sites OK, seroma on R.  Portacath site dressed and clean  Lab Results:  No results found for this basename: WBC:2,HGB:2,HCT:2,PLT:2 in the last 72 hours BMET No results found for this basename: NA:2,K:2,CL:2,CO2:2,GLUCOSE:2,BUN:2,CREATININE:2,CALCIUM:2 in the last 72 hours PT/INR No results found for this basename: LABPROT:2,INR:2 in the last 72 hours ABG No results found for this basename: PHART:2,PCO2:2,PO2:2,HCO3:2 in the last 72 hours  Studies/Results: Dg Chest Port 1v Same Day  12/23/2010  *RADIOLOGY REPORT*  Clinical Data: Status post attempted central line placement.  PORTABLE CHEST - 1 VIEW SAME DAY  Comparison: Chest x-ray 11/26/2010.  Findings: Port-A-Cath has been removed.  Surgical staples are seen over the right and left chest.  There is mild left basilar atelectasis.  There is no visible pneumothorax.  IMPRESSION: No visible pneumothorax.  Mild left basilar atelectasis.  Original Report Authenticated By: Elsie Stain, M.D.    Anti-infectives: Anti-infectives     Start     Dose/Rate Route Frequency Ordered Stop   12/22/10 1700   ceFEPIme (MAXIPIME) 2 g in dextrose 5 % 50 mL IVPB        2 g 100 mL/hr over 30 Minutes Intravenous Every 12 hours 12/22/10 1612     12/20/10 1500   vancomycin (VANCOCIN) IVPB 1000 mg/200 mL premix  Status:   Discontinued        1,000 mg 200 mL/hr over 60 Minutes Intravenous Every 12 hours 12/20/10 0910 12/22/10 1539   12/20/10 0300   vancomycin (VANCOCIN) IVPB 1000 mg/200 mL premix  Status:  Discontinued        1,000 mg 200 mL/hr over 60 Minutes Intravenous Every 8 hours 12/20/10 0256 12/20/10 0910   12/20/10 0150   piperacillin-tazobactam (ZOSYN) IVPB 3.375 g  Status:  Discontinued        3.375 g 12.5 mL/hr over 240 Minutes Intravenous 3 times per day 12/20/10 0150 12/22/10 1539   12/19/10 2245   piperacillin-tazobactam (ZOSYN) IVPB 3.375 g  Status:  Discontinued        3.375 g 12.5 mL/hr over 240 Minutes Intravenous 3 times per day 12/19/10 2243 12/19/10 2247          Assessment/Plan: s/p Procedure(s): REMOVAL PORT-A-CATH Fever improved.  ?DVT LUE? Doppler pending.  Will plan to aspirate seroma R chest   LOS: 6 days    Mirage Pfefferkorn T 12/25/2010

## 2010-12-25 NOTE — Progress Notes (Signed)
Interval History: Mrs. Harcum 55 year old female who was admitted on 12/19/2010 with fever altered mental status in the setting of being treated for a staph surgical wound infection. She had been on IV vancomycin at home when she developed a fever as high as 103.8. She was placed on broad-spectrum antibiotics. A CT scan of her chest was done on 12/21/2010 which showed bilateral chest wall cellulitis and a 12x4 centimeter fluid collection in the subcutaneous fat on the right. General surgery and the infectious disease physicians were subsequently consulted. The patient had her Port-A-Cath removed on 12/23/2010. She has been stable postoperatively although over the past 24 hours, has been complaining of neck pain. Infectious disease consultants are recommending further imaging of the neck given her bacteremia as well as a two-dimensional echocardiogram to rule out occult endocarditis.  ROS: Mrs. Ovens continues to complain of neck pain. She has some nausea but no vomiting. She complains of diffuse swelling to her hands and feet and is requesting that we resume her diuretic therapy. She also complains of left arm pain and a sensation of warmth.   Objective: Vital signs in last 24 hours: Temp:  [98.6 F (37 C)-99.6 F (37.6 C)] 98.6 F (37 C) (11/17 0500) Pulse Rate:  [86-99] 86  (11/17 0500) Resp:  [16-18] 16  (11/17 0500) BP: (95-146)/(66-77) 95/66 mmHg (11/17 0500) SpO2:  [93 %-96 %] 96 % (11/17 0500) Weight change:  Last BM Date: 12/22/10  Intake/Output from previous day:  Intake/Output Summary (Last 24 hours) at 12/25/10 0931 Last data filed at 12/25/10 0531  Gross per 24 hour  Intake   1981 ml  Output   1200 ml  Net    781 ml     Physical Exam:  Gen:  No acute distress. Cardiovascular:  Regular rate, and rhythm. No murmurs, rubs, or gallops. Respiratory: Lungs clear to auscultation bilaterally. Gastrointestinal: Abdomen soft, nontender, nondistended with normal active bowel  sounds. Extremities: 1-2+ edema to the bilateral hands and feet.   Lab Results: CBC    Component Value Date/Time   WBC 4.7 12/22/2010 0254   WBC 11.0* 09/23/2010 1425   RBC 3.62* 12/22/2010 0254   HGB 9.3* 12/22/2010 0254   HGB 13.2 09/23/2010 1425   HCT 30.1* 12/22/2010 0254   HCT 39.3 09/23/2010 1425   PLT 128* 12/22/2010 0254   PLT 181 09/23/2010 1425   MCV 83.1 12/22/2010 0254   MCV 84.0 09/23/2010 1425   MCH 25.7* 12/22/2010 0254   MCHC 30.9 12/22/2010 0254   RDW 15.6* 12/22/2010 0254   LYMPHSABS 2.7 11/30/2010 0655   MONOABS 1.7* 11/30/2010 0655   EOSABS 0.1 11/30/2010 0655   EOSABS 0.1 09/23/2010 1425   BASOSABS 0.0 11/30/2010 0655   BASOSABS 0.0 09/23/2010 1425    BMET    Component Value Date/Time   NA 135 12/22/2010 0254   K 3.2* 12/22/2010 0254   CL 98 12/22/2010 0254   CO2 30 12/22/2010 0254   GLUCOSE 110* 12/22/2010 0254   BUN 4* 12/22/2010 0254   CREATININE 0.98 12/22/2010 0254   CALCIUM 8.0* 12/22/2010 0254   GFRNONAA 63* 12/22/2010 0254   GFRAA 73* 12/22/2010 0254    Recent Results (from the past 240 hour(s))  CULTURE, BLOOD (SINGLE)     Status: Normal (Preliminary result)   Collection Time   12/20/10 12:10 AM      Component Value Range Status Comment   Specimen Description BLOOD LEFT CHEST PORT   Final    Special Requests BOTTLES  DRAWN AEROBIC AND ANAEROBIC 5CC   Final    Setup Time [phone]   Final    Culture     Final    Value:        BLOOD CULTURE RECEIVED NO GROWTH TO DATE CULTURE WILL BE HELD FOR 5 DAYS BEFORE ISSUING A FINAL NEGATIVE REPORT   Report Status PENDING   Incomplete   CULTURE, BLOOD (SINGLE)     Status: Normal (Preliminary result)   Collection Time   12/20/10 12:20 AM      Component Value Range Status Comment   Specimen Description BLOOD R HAND   Final    Special Requests BOTTLES DRAWN AEROBIC ONLY 5CC   Final    Setup Time [id number]   Final    Culture     Final    Value:        BLOOD CULTURE RECEIVED NO GROWTH TO DATE  CULTURE WILL BE HELD FOR 5 DAYS BEFORE ISSUING A FINAL NEGATIVE REPORT   Report Status PENDING   Incomplete   WOUND CULTURE     Status: Normal   Collection Time   12/20/10  9:45 AM      Component Value Range Status Comment   Specimen Description BREAST   Final    Special Requests NONE   Final    Gram Stain     Final    Value: RARE WBC PRESENT, PREDOMINANTLY MONONUCLEAR     NO SQUAMOUS EPITHELIAL CELLS SEEN     NO ORGANISMS SEEN   Culture NO GROWTH 2 DAYS   Final    Report Status 12/22/2010 FINAL   Final   MRSA PCR SCREENING     Status: Normal   Collection Time   12/20/10  9:45 AM      Component Value Range Status Comment   MRSA by PCR NEGATIVE  NEGATIVE  Final   WOUND CULTURE (INCLUDES ANAEROBIC)     Status: Normal (Preliminary result)   Collection Time   12/23/10  2:05 PM      Component Value Range Status Comment   Specimen Description WOUND LEFT CHEST PORT A CATH POCKET   Final    Special Requests PATIENT ON FOLLOWING ZOSYN   Final    Gram Stain     Final    Value: RARE WBC PRESENT,BOTH PMN AND MONONUCLEAR     NO SQUAMOUS EPITHELIAL CELLS SEEN     NO ORGANISMS SEEN   Culture NO GROWTH 1 DAY   Final    Report Status PENDING   Incomplete   ANAEROBIC CULTURE     Status: Normal (Preliminary result)   Collection Time   12/23/10  2:05 PM      Component Value Range Status Comment   Specimen Description WOUND LEFT CHEST PORT A CATH POCKET   Final    Special Requests PATIENT ON FOLLOWING ZOSYN   Final    Gram Stain     Final    Value: RARE WBC PRESENT,BOTH PMN AND MONONUCLEAR     NO SQUAMOUS EPITHELIAL CELLS SEEN     NO ORGANISMS SEEN   Culture     Final    Value: NO ANAEROBES ISOLATED; CULTURE IN PROGRESS FOR 5 DAYS   Report Status PENDING   Incomplete   CATH TIP CULTURE     Status: Normal (Preliminary result)   Collection Time   12/23/10  2:05 PM      Component Value Range Status Comment   Specimen Description CATH TIP LEFT CHEST  PORT A CATH   Final    Special Requests  PATIENT ON FOLLOWING ZOSYN   Final    Culture NO GROWTH 1 DAY   Final    Report Status PENDING   Incomplete     Studies/Results: Dg Chest Port 1v Same Day  12/23/2010  *RADIOLOGY REPORT*  Clinical Data: Status post attempted central line placement.  PORTABLE CHEST - 1 VIEW SAME DAY  Comparison: Chest x-ray 11/26/2010.  Findings: Port-A-Cath has been removed.  Surgical staples are seen over the right and left chest.  There is mild left basilar atelectasis.  There is no visible pneumothorax.  IMPRESSION: No visible pneumothorax.  Mild left basilar atelectasis.  Original Report Authenticated By: Elsie Stain, M.D.    Medications: Scheduled Meds:   . ceFEPIme (MAXIPIME) 2 GM IVP  2 g Intravenous Q12H  . docusate sodium  100 mg Oral BID  . DULoxetine  60 mg Oral BID  . enoxaparin (LOVENOX) injection  40 mg Subcutaneous Q24H  . fentaNYL  150 mcg Transdermal Q72H  . gabapentin  900 mg Oral TID  . metoCLOPramide  10 mg Oral BID  . pantoprazole  40 mg Oral Q1200  . potassium chloride SA  20 mEq Oral BID  . traZODone  150 mg Oral QHS   Continuous Infusions:   . 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 Her Port-A-Cath was removed on 12/23/2010. One of 2 blood cultures at [facility] grew Pantoea agglomerans but her blood cultures have remained negative here. Given the recommendations from ID, we do need to rule out endocarditis and discitis given her neck pain. I have canceled the TTE and ordered a TEE for evaluation of her heart valves. An MRI of the cervical and lumbar spine has  been ordered to rule out discitis. Active Problems:  Neck pain We do need to rule out discitis given her bacteremia. MRI scans of the cervical and lumbar spine have been ordered. Patient is allergic to contrast and therefore could not have him.  CARCINOMA, BREAST The patient is status post bilateral mastectomies. She can followup with her oncologist post discharge.  PERSONALITY DISORDER Continue Klonopin for anxiety.  Chronic pain syndrome Continue pain medications for pain control.  Hypokalemia The patient's potassium was 3.2 on 12/22/2010. She is on routine supplementation therapy.  Labs unable to be drawn today due to line issues.    Edema extremities IVF were KVO'd yesterday.  Will give lasix x 1 today.  DIABETES MELLITUS, TYPE II The patient states that she has not been on any diabetic medications for the past 5 years. We will run a hemoglobin A1c is soon as we can establish access and get a blood sample. Poor IV access I have spoken with the pulmonary critical care team, and they are unable to pass a central line given that they attempted to pass a line and this was unsuccessful. They did cannulate a vessel on the right neck, however this IV has become dislodged and is no longer operable. I spoke with the interventional radiologist, Dr. Deanne Coffer, about putting in a central line. The patient will need a 13 hour steroid prep given her history of allergy to IV contrast. In  the interim, I have authorized the nurses to use her right arm for peripheral IV for 24 hour use. This is suboptimal given her history of bilateral mastectomies but we really do not have any other options for IV access. The IV team was unable to find a spot in her feet. This has fully been explained to the patient and she wishes to proceed.     LOS: 6 days   Hillery Aldo, MD Pager [phone]  12/25/2010, 9:31 AM

## 2010-12-25 NOTE — Progress Notes (Signed)
Central Catheter Line placement requested for antibiotic therapy. Difficult venous access.  Pt has a history of contrast dye allergy. 13 hour prednisone protocol ordered as well as benadryl for tomorrow AM. Discussed procedure with patient along with risks and benefits. Informed consent obtained. Pt also states she is allergic to Versed and has had anaphylaxis in past. Discussed with Dr. Deanne Coffer. Procedure planned for 7am tomorrow.

## 2010-12-25 NOTE — Progress Notes (Signed)
ANTIBIOTIC CONSULT NOTE - Follow Up  Pharmacy Consult for Cefepime Indication: Coagulase-negative Staphylococcus species bacteremia; portacath associated CNS bacteremia  Allergies  Allergen Reactions  . Infliximab Nausea And Vomiting  . Iohexol      Code: SOB   . Ketorolac Tromethamine     REACTION: but pt states not injectable  . Methotrexate Other (See Comments)    unknown  . Nalbuphine Other (See Comments)    unknown  . Nsaids Nausea And Vomiting  . Celecoxib Rash    Patient Measurements: Height: 5' 5.5" (166.4 cm) Weight: 196 lb (88.905 kg) IBW/kg (Calculated) : 58.15    Vital Signs: Temp: 98.6 F (37 C) (11/17 0500) Temp src: Oral (11/17 0500) BP: 95/66 mmHg (11/17 0500) Pulse Rate: 86  (11/17 0500) Intake/Output from previous day: 11/16 0701 - 11/17 0700 In: 2461 [P.O.:1765; I.V.:396; IV Piggyback:300] Out: 1600 [Urine:1600] Intake/Output from this shift:    Labs: No results found for this basename: WBC:3,HGB:3,PLT:3,LABCREA:3,CREATININE:3 in the last 72 hours Estimated Creatinine Clearance: 71.3 ml/min (by C-G formula based on Cr of 0.98). No results found for this basename: VANCOTROUGH:2,VANCOPEAK:2,VANCORANDOM:2,GENTTROUGH:2,GENTPEAK:2,GENTRANDOM:2,TOBRATROUGH:2,TOBRAPEAK:2,TOBRARND:2,AMIKACINPEAK:2,AMIKACINTROU:2,AMIKACIN:2, in the last 72 hours   Microbiology: Recent Results (from the past 720 hour(s))  CULTURE, BLOOD (ROUTINE X 2)     Status: Normal   Collection Time   11/26/10  2:20 AM      Component Value Range Status Comment   Specimen Description BLOOD LEFT PAC   Final    Special Requests BOTTLES DRAWN AEROBIC AND ANAEROBIC 6 CC EACH   Final    Setup Time [id number]   Final    Culture NO GROWTH 5 DAYS   Final    Report Status 12/02/2010 FINAL   Final   CULTURE, BLOOD (ROUTINE SINGLE)     Status: Normal   Collection Time   12/07/10 10:25 AM      Component Value Range Status Comment   Specimen Description BLOOD   Final    Special Requests  BOTTLES DRAWN AEROBIC AND ANAEROBIC   Final    Setup Time [id number]   Final    Culture NO GROWTH 5 DAYS   Final    Report Status 12/13/2010 FINAL   Final   CULTURE, BLOOD (SINGLE)     Status: Normal (Preliminary result)   Collection Time   12/20/10 12:10 AM      Component Value Range Status Comment   Specimen Description BLOOD LEFT CHEST PORT   Final    Special Requests BOTTLES DRAWN AEROBIC AND ANAEROBIC 5CC   Final    Setup Time [id number]   Final    Culture     Final    Value:        BLOOD CULTURE RECEIVED NO GROWTH TO DATE CULTURE WILL BE HELD FOR 5 DAYS BEFORE ISSUING A FINAL NEGATIVE REPORT   Report Status PENDING   Incomplete   CULTURE, BLOOD (SINGLE)     Status: Normal (Preliminary result)   Collection Time   12/20/10 12:20 AM      Component Value Range Status Comment   Specimen Description BLOOD R HAND   Final    Special Requests BOTTLES DRAWN AEROBIC ONLY 5CC   Final    Setup Time [id number]   Final    Culture     Final    Value:        BLOOD CULTURE RECEIVED NO GROWTH TO DATE CULTURE WILL BE HELD FOR 5 DAYS BEFORE ISSUING A FINAL NEGATIVE REPORT   Report Status PENDING  Incomplete   WOUND CULTURE     Status: Normal   Collection Time   12/20/10  9:45 AM      Component Value Range Status Comment   Specimen Description BREAST   Final    Special Requests NONE   Final    Gram Stain     Final    Value: RARE WBC PRESENT, PREDOMINANTLY MONONUCLEAR     NO SQUAMOUS EPITHELIAL CELLS SEEN     NO ORGANISMS SEEN   Culture NO GROWTH 2 DAYS   Final    Report Status 12/22/2010 FINAL   Final   MRSA PCR SCREENING     Status: Normal   Collection Time   12/20/10  9:45 AM      Component Value Range Status Comment   MRSA by PCR NEGATIVE  NEGATIVE  Final   WOUND CULTURE (INCLUDES ANAEROBIC)     Status: Normal (Preliminary result)   Collection Time   12/23/10  2:05 PM      Component Value Range Status Comment   Specimen Description WOUND LEFT CHEST PORT A CATH POCKET   Final      Special Requests PATIENT ON FOLLOWING ZOSYN   Final    Gram Stain     Final    Value: RARE WBC PRESENT,BOTH PMN AND MONONUCLEAR     NO SQUAMOUS EPITHELIAL CELLS SEEN     NO ORGANISMS SEEN   Culture NO GROWTH 1 DAY   Final    Report Status PENDING   Incomplete   ANAEROBIC CULTURE     Status: Normal (Preliminary result)   Collection Time   12/23/10  2:05 PM      Component Value Range Status Comment   Specimen Description WOUND LEFT CHEST PORT A CATH POCKET   Final    Special Requests PATIENT ON FOLLOWING ZOSYN   Final    Gram Stain     Final    Value: RARE WBC PRESENT,BOTH PMN AND MONONUCLEAR     NO SQUAMOUS EPITHELIAL CELLS SEEN     NO ORGANISMS SEEN   Culture     Final    Value: NO ANAEROBES ISOLATED; CULTURE IN PROGRESS FOR 5 DAYS   Report Status PENDING   Incomplete   CATH TIP CULTURE     Status: Normal (Preliminary result)   Collection Time   12/23/10  2:05 PM      Component Value Range Status Comment   Specimen Description CATH TIP LEFT CHEST PORT A CATH   Final    Special Requests PATIENT ON FOLLOWING ZOSYN   Final    Culture NO GROWTH 1 DAY   Final    Report Status PENDING   Incomplete     Medical History: Past Medical History  Diagnosis Date  . PERSONALITY DISORDER   . Chronic pain syndrome     chronic narcotics, dependancy hx - [facility] for pain mgmt  . MIGRAINE HEADACHE   . Lyme disease   . ALLERGIC RHINITIS   . ANEMIA-IRON DEFICIENCY   . HYPERLIPIDEMIA   . GERD   . DIABETES MELLITUS, TYPE II   . DEPRESSION   . SEIZURE DISORDER   . CARCINOMA, BREAST 08/2009 dx    R breast, reports mets to liver  . SLE (systemic lupus erythematosus)     rheum at [place] ([place]) - chronic pred  . Psoriasis   . Addison's disease   . Metastases to the liver   . Degenerative joint disease   . Migraine headache   .  DDD (degenerative disc disease)     Medications:  Scheduled:     . ceFEPIme (MAXIPIME) 2 GM IVP  2 g Intravenous Q12H  . diphenhydrAMINE  50 mg Oral Once  .  docusate sodium  100 mg Oral BID  . DULoxetine  60 mg Oral BID  . enoxaparin (LOVENOX) injection  40 mg Subcutaneous Q24H  . fentaNYL  150 mcg Transdermal Q72H  . furosemide  20 mg Oral Daily  . gabapentin  900 mg Oral TID  . metoCLOPramide  10 mg Oral BID  . pantoprazole  40 mg Oral Q1200  . potassium chloride SA  20 mEq Oral BID  . predniSONE  50 mg Oral Q6H  . sodium chloride  3 mL Intravenous Q12H  . traZODone  150 mg Oral QHS   Assessment: 55YOF on cefepime D4 for coagulase-negative Staphylococcus CNS bacteremia from persistent Port-A-Cath infection.  Broad spectrum antibiotic chosen to also cover for recent history of Enterobacter bacteremia. TEE pending to r/o IE. Length of treatment per ID consult (abour ~ 4 weeks). Cefepime dose adjusted for Estimated Creatinine Clearance: 71.3 ml/min (by C-G formula based on Cr of 0.98). No BMET since 11/14.   Plan:  Continue Cefepime 2g IV q12h.   Lucille Crichlow Sameh 12/25/2010,11:36 AM

## 2010-12-25 NOTE — Progress Notes (Signed)
*  PRELIMINARY RESULTS*  There is an acute, partially occlusive deep vein thrombosis noted in the left internal jugular vein.  All other veins appear thrombus free. has been performed.  [name] Carr 12/25/2010, 1:20 PM

## 2010-12-25 NOTE — Progress Notes (Signed)
ANTICOAGULATION CONSULT NOTE - Initial Consult  Pharmacy Consult for Lovenox  Indication: DVT  Allergies  Allergen Reactions  . Versed Anaphylaxis  . Infliximab Nausea And Vomiting  . Iohexol      Code: SOB   . Ketorolac Tromethamine     REACTION: but pt states not injectable  . Methotrexate Other (See Comments)    unknown  . Nalbuphine Other (See Comments)    unknown  . Nsaids Nausea And Vomiting  . Celecoxib Rash    Patient Measurements: Height: 5' 5.5" (166.4 cm) Weight: 196 lb (88.905 kg) IBW/kg (Calculated) : 58.15   Vital Signs: Temp: 98.6 F (37 C) (11/17 0500) Temp src: Oral (11/17 0500) BP: 95/66 mmHg (11/17 0500) Pulse Rate: 86  (11/17 0500)  Labs: No results found for this basename: HGB:2,HCT:3,PLT:3,APTT:3,LABPROT:3,INR:3,HEPARINUNFRC:3,CREATININE:3,CKTOTAL:3,CKMB:3,TROPONINI:3 in the last 72 hours Estimated Creatinine Clearance: 71.3 ml/min (by C-G formula based on Cr of 0.98).  Medical History: Past Medical History  Diagnosis Date  . PERSONALITY DISORDER   . Chronic pain syndrome     chronic narcotics, dependancy hx - [facility] for pain mgmt  . MIGRAINE HEADACHE   . Lyme disease   . ALLERGIC RHINITIS   . ANEMIA-IRON DEFICIENCY   . HYPERLIPIDEMIA   . GERD   . DIABETES MELLITUS, TYPE II   . DEPRESSION   . SEIZURE DISORDER   . CARCINOMA, BREAST 08/2009 dx    R breast, reports mets to liver  . SLE (systemic lupus erythematosus)     rheum [place] ([place]) - chronic pred  . Psoriasis   . Addison's disease   . Metastases to the liver   . Degenerative joint disease   . Migraine headache   . DDD (degenerative disc disease)     Medications:  Scheduled:    . ceFEPIme (MAXIPIME) 2 GM IVP  2 g Intravenous Q12H  . diphenhydrAMINE  50 mg Oral Once  . docusate sodium  100 mg Oral BID  . DULoxetine  60 mg Oral BID  . fentaNYL  150 mcg Transdermal Q72H  . furosemide  20 mg Oral Daily  . gabapentin  900 mg Oral TID  . metoCLOPramide  10 mg Oral BID  .  pantoprazole  40 mg Oral Q1200  . potassium chloride SA  20 mEq Oral BID  . predniSONE  50 mg Oral Q6H  . sodium chloride  3 mL Intravenous Q12H  . traZODone  150 mg Oral QHS  . DISCONTD: enoxaparin (LOVENOX) injection  40 mg Subcutaneous Q24H   Infusions:    . sodium chloride    . 0.9 % NaCl with KCl 20 mEq / L 20 mL/hr at 12/24/10 1316   PRN: acetaminophen, acetaminophen, carisoprodol, clonazePAM, HYDROcodone-acetaminophen, HYDROmorphone (DILAUDID) injection, ondansetron (ZOFRAN) IV, ondansetron, promethazine, promethazine, sodium chloride  Assessment: 55 yoF to start Lovenox for acute DVT. No CBC since 11/14.   Plan:  1. Start Lovenox 90 mg q 12 hours starting now. 2. Will obtain CBC with AM labs  Captola Teschner, Lorra Hals 12/25/2010,2:17 PM

## 2010-12-26 ENCOUNTER — Inpatient Hospital Stay: Payer: Medicare Other

## 2010-12-26 ENCOUNTER — Inpatient Hospital Stay (HOSPITAL_COMMUNITY): Payer: Medicare Other

## 2010-12-26 DIAGNOSIS — I82409 Acute embolism and thrombosis of unspecified deep veins of unspecified lower extremity: Secondary | ICD-10-CM | POA: Diagnosis not present

## 2010-12-26 LAB — WOUND CULTURE: Culture: NO GROWTH

## 2010-12-26 LAB — BASIC METABOLIC PANEL
Calcium: 9 mg/dL (ref 8.4–10.5)
Creatinine, Ser: 0.9 mg/dL (ref 0.50–1.10)
GFR calc non Af Amer: 70 mL/min — ABNORMAL LOW (ref >90)
Glucose, Bld: 209 mg/dL — ABNORMAL HIGH (ref 70–99)
Sodium: 137 mEq/L (ref 135–145)

## 2010-12-26 LAB — HEMOGLOBIN A1C
Hgb A1c MFr Bld: 6.1 % — ABNORMAL HIGH (ref <5.7)
Mean Plasma Glucose: 128 mg/dL — ABNORMAL HIGH (ref <117)

## 2010-12-26 LAB — CBC
MCH: 25.5 pg — ABNORMAL LOW (ref 26.0–34.0)
MCHC: 31.1 g/dL (ref 30.0–36.0)
MCV: 81.9 fL (ref 78.0–100.0)
Platelets: 228 10*3/uL (ref 150–400)

## 2010-12-26 LAB — CATH TIP CULTURE: Culture: NO GROWTH

## 2010-12-26 LAB — CULTURE, BLOOD (SINGLE)

## 2010-12-26 MED ORDER — HYDROCHLOROTHIAZIDE 25 MG PO TABS
25.0000 mg | ORAL_TABLET | Freq: Every day | ORAL | Status: DC
  Administered 2010-12-26 – 2011-01-03 (×9): 25 mg via ORAL
  Filled 2010-12-26 (×9): qty 1

## 2010-12-26 MED ORDER — PROMETHAZINE HCL 25 MG/ML IJ SOLN
12.5000 mg | Freq: Four times a day (QID) | INTRAMUSCULAR | Status: DC | PRN
  Administered 2010-12-26: 12.5 mg via INTRAVENOUS
  Filled 2010-12-26: qty 1

## 2010-12-26 MED ORDER — FENTANYL 75 MCG/HR TD PT72
150.0000 ug | MEDICATED_PATCH | TRANSDERMAL | Status: DC
  Administered 2010-12-29 – 2011-01-01 (×2): 150 ug via TRANSDERMAL
  Filled 2010-12-26 (×2): qty 2

## 2010-12-26 MED ORDER — HYDROMORPHONE HCL PF 2 MG/ML IJ SOLN
1.0000 mg | INTRAMUSCULAR | Status: DC | PRN
  Administered 2010-12-25 – 2010-12-26 (×4): 1 mg via INTRAVENOUS
  Filled 2010-12-26 (×2): qty 1

## 2010-12-26 MED ORDER — PROMETHAZINE HCL 25 MG/ML IJ SOLN
25.0000 mg | Freq: Four times a day (QID) | INTRAMUSCULAR | Status: DC | PRN
  Administered 2010-12-26 – 2010-12-31 (×15): 25 mg via INTRAVENOUS
  Administered 2010-12-31: 12.5 mg via INTRAVENOUS
  Administered 2010-12-31 – 2011-01-03 (×6): 25 mg via INTRAVENOUS
  Filled 2010-12-26 (×23): qty 1

## 2010-12-26 MED ORDER — HYDROMORPHONE HCL PF 2 MG/ML IJ SOLN
INTRAMUSCULAR | Status: AC
  Administered 2010-12-26: 1 mg via INTRAVENOUS
  Filled 2010-12-26: qty 1

## 2010-12-26 MED ORDER — PROMETHAZINE HCL 25 MG/ML IJ SOLN: 12.5000 mg | Freq: Four times a day (QID) | INTRAMUSCULAR | Status: DC | PRN

## 2010-12-26 MED ORDER — PROMETHAZINE HCL 25 MG PO TABS
25.0000 mg | ORAL_TABLET | Freq: Four times a day (QID) | ORAL | Status: DC | PRN
  Administered 2011-01-02: 25 mg via ORAL
  Filled 2010-12-26: qty 1

## 2010-12-26 MED ORDER — FENTANYL 75 MCG/HR TD PT72: 175.0000 ug | MEDICATED_PATCH | TRANSDERMAL | Status: DC

## 2010-12-26 MED ORDER — SPIRONOLACTONE 25 MG PO TABS
25.0000 mg | ORAL_TABLET | Freq: Every day | ORAL | Status: DC
  Administered 2010-12-26 – 2011-01-03 (×9): 25 mg via ORAL
  Filled 2010-12-26 (×9): qty 1

## 2010-12-26 MED ORDER — PROMETHAZINE HCL 25 MG PO TABS: 12.5000 mg | ORAL_TABLET | Freq: Four times a day (QID) | ORAL | Status: DC | PRN

## 2010-12-26 MED ORDER — FENTANYL CITRATE 0.05 MG/ML IJ SOLN
INTRAMUSCULAR | Status: AC | PRN
  Administered 2010-12-26: 100 ug via INTRAVENOUS

## 2010-12-26 MED ORDER — HYDROMORPHONE HCL PF 2 MG/ML IJ SOLN
1.5000 mg | INTRAMUSCULAR | Status: DC | PRN
  Administered 2010-12-26 – 2010-12-27 (×6): 1.5 mg via INTRAVENOUS
  Filled 2010-12-26 (×6): qty 1

## 2010-12-26 MED ORDER — PROMETHAZINE HCL 25 MG PO TABS: 25.0000 mg | ORAL_TABLET | Freq: Four times a day (QID) | ORAL | Status: DC | PRN

## 2010-12-26 MED ORDER — LIDOCAINE HCL (PF) 1 % IJ SOLN
INTRAMUSCULAR | Status: AC
  Filled 2010-12-26: qty 30

## 2010-12-26 NOTE — Progress Notes (Signed)
  3 Days Post-Op  Subjective: Some neck discomfort but better  Objective: Vital signs in last 24 hours: Temp:  [98.2 F (36.8 C)-98.5 F (36.9 C)] 98.3 F (36.8 C) (11/18 0521) Pulse Rate:  [76-92] 92  (11/18 0831) Resp:  [11-30] 30  (11/18 0831) BP: (123-167)/(68-87) 140/68 mmHg (11/18 0831) SpO2:  [94 %-99 %] 99 % (11/18 0831) Last BM Date: 12/22/10  Intake/Output from previous day: 11/17 0701 - 11/18 0700 In: 588 [P.O.:420; I.V.:168] Out: 2100 [Urine:2100] Intake/Output this shift:    General appearance: alert and no distress Incision/Wound:Wounds all clean without evidence of infection 250cc straw colored clear seroma aspirated right mastectomy wound with sterile technique  Lab Results:  No results found for this basename: WBC:2,HGB:2,HCT:2,PLT:2 in the last 72 hours BMET No results found for this basename: NA:2,K:2,CL:2,CO2:2,GLUCOSE:2,BUN:2,CREATININE:2,CALCIUM:2 in the last 72 hours PT/INR No results found for this basename: LABPROT:2,INR:2 in the last 72 hours ABG No results found for this basename: PHART:2,PCO2:2,PO2:2,HCO3:2 in the last 72 hours  Studies/Results: No results found.  Anti-infectives: Anti-infectives     Start     Dose/Rate Route Frequency Ordered Stop   12/22/10 1700   ceFEPIme (MAXIPIME) 2 g in dextrose 5 % 50 mL IVPB        2 g 100 mL/hr over 30 Minutes Intravenous Every 12 hours 12/22/10 1612     12/20/10 1500   vancomycin (VANCOCIN) IVPB 1000 mg/200 mL premix  Status:  Discontinued        1,000 mg 200 mL/hr over 60 Minutes Intravenous Every 12 hours 12/20/10 0910 12/22/10 1539   12/20/10 0300   vancomycin (VANCOCIN) IVPB 1000 mg/200 mL premix  Status:  Discontinued        1,000 mg 200 mL/hr over 60 Minutes Intravenous Every 8 hours 12/20/10 0256 12/20/10 0910   12/20/10 0150   piperacillin-tazobactam (ZOSYN) IVPB 3.375 g  Status:  Discontinued        3.375 g 12.5 mL/hr over 240 Minutes Intravenous 3 times per day 12/20/10 0150  12/22/10 1539   12/19/10 2245   piperacillin-tazobactam (ZOSYN) IVPB 3.375 g  Status:  Discontinued        3.375 g 12.5 mL/hr over 240 Minutes Intravenous 3 times per day 12/19/10 2243 12/19/10 2247          Assessment/Plan: s/p Procedure(s): REMOVAL PORT-A-CATH S/P bilateral mastectomy-wounds well healed-seroma aspirateed Gr pos sepsis, cath out DVT R IJV, on Lovenox Stable/improving   LOS: 7 days    Seraj Dunnam T 12/26/2010  ;l

## 2010-12-26 NOTE — ED Notes (Signed)
Pt. Continues to  Complain about little things.

## 2010-12-26 NOTE — ED Notes (Signed)
Vital signs stable. 

## 2010-12-26 NOTE — ED Notes (Signed)
Patient denies pain and is resting comfortably.  

## 2010-12-26 NOTE — Progress Notes (Signed)
Interval History: [name] Carr 55 year old female who was admitted on 12/19/2010 with fever altered mental status in the setting of being treated for a staph surgical wound infection. She had been on IV vancomycin at home when she developed a fever as high as 103.8. She was placed on broad-spectrum antibiotics. A CT scan of her chest was done on 12/21/2010 which showed bilateral chest wall cellulitis and a 12x4 centimeter fluid collection in the subcutaneous fat on the right. General surgery and the infectious disease physicians were subsequently consulted. The patient had her Port-A-Cath removed on 12/23/2010. Her hospital course has been complicated by difficulty obtaining IV access. Neither of her arms were considered good sources for IV access due to her history of bilateral mastectomy and risk of lymphedema. Her Port-A-Cath had been removed and she had active cellulitis to her chest wall preventing access through the chest wall. The critical care team attempted to put a central line into her right internal jugular vein but they were unable to thread the catheter and put a peripheral line in that became dislodged shortly afterwards. She also developed a DVT involving the left internal jugular. The patient ultimately had a right IJ tunnel catheter placed by the interventional radiologist on 12/26/2010.  ROS: [name] Carr feels that her neck discomfort has improved. She is hungry and requesting a diet. The edema in her hands and feet have also improved with a dose of Lasix given yesterday.   Objective: Vital signs in last 24 hours: Temp:  [98.2 F (36.8 C)-98.5 F (36.9 C)] 98.3 F (36.8 C) (11/18 0521) Pulse Rate:  [76-92] 92  (11/18 0831) Resp:  [11-30] 30  (11/18 0831) BP: (123-167)/(68-87) 140/68 mmHg (11/18 0831) SpO2:  [94 %-99 %] 99 % (11/18 0831) Weight change:  Last BM Date: 12/22/10  Intake/Output from previous day:  Intake/Output Summary (Last 24 hours) at 12/26/10 0904 Last data  filed at 12/25/10 2100  Gross per 24 hour  Intake    588 ml  Output   2100 ml  Net  -1512 ml     Physical Exam:  Gen:  No acute distress. Cardiovascular:  Heart sounds regular. No murmurs, rubs, or gallops. Respiratory: Lungs clear to auscultation bilaterally. Gastrointestinal: Abdomen soft, nontender, nondistended with normal active bowel sounds. Extremities: Decreased edema but still at 2+.   Lab Results: CBC    Component Value Date/Time   WBC 4.7 12/22/2010 0254   WBC 11.0* 09/23/2010 1425   RBC 3.62* 12/22/2010 0254   HGB 9.3* 12/22/2010 0254   HGB 13.2 09/23/2010 1425   HCT 30.1* 12/22/2010 0254   HCT 39.3 09/23/2010 1425   PLT 128* 12/22/2010 0254   PLT 181 09/23/2010 1425   MCV 83.1 12/22/2010 0254   MCV 84.0 09/23/2010 1425   MCH 25.7* 12/22/2010 0254   MCHC 30.9 12/22/2010 0254   RDW 15.6* 12/22/2010 0254   LYMPHSABS 2.7 11/30/2010 0655   MONOABS 1.7* 11/30/2010 0655   EOSABS 0.1 11/30/2010 0655   EOSABS 0.1 09/23/2010 1425   BASOSABS 0.0 11/30/2010 0655   BASOSABS 0.0 09/23/2010 1425    BMET    Component Value Date/Time   NA 135 12/22/2010 0254   K 3.2* 12/22/2010 0254   CL 98 12/22/2010 0254   CO2 30 12/22/2010 0254   GLUCOSE 110* 12/22/2010 0254   BUN 4* 12/22/2010 0254   CREATININE 0.98 12/22/2010 0254   CALCIUM 8.0* 12/22/2010 0254   GFRNONAA 63* 12/22/2010 0254   GFRAA 73* 12/22/2010 0254  Recent Results (from the past 240 hour(s))  CULTURE, BLOOD (SINGLE)     Status: Normal (Preliminary result)   Collection Time   12/20/10 12:10 AM      Component Value Range Status Comment   Specimen Description BLOOD LEFT CHEST PORT   Final    Special Requests BOTTLES DRAWN AEROBIC AND ANAEROBIC 5CC   Final    Setup Time [id number]   Final    Culture     Final    Value:        BLOOD CULTURE RECEIVED NO GROWTH TO DATE CULTURE WILL BE HELD FOR 5 DAYS BEFORE ISSUING A FINAL NEGATIVE REPORT   Report Status PENDING   Incomplete   CULTURE, BLOOD (SINGLE)      Status: Normal (Preliminary result)   Collection Time   12/20/10 12:20 AM      Component Value Range Status Comment   Specimen Description BLOOD R HAND   Final    Special Requests BOTTLES DRAWN AEROBIC ONLY 5CC   Final    Setup Time [id number]   Final    Culture     Final    Value:        BLOOD CULTURE RECEIVED NO GROWTH TO DATE CULTURE WILL BE HELD FOR 5 DAYS BEFORE ISSUING A FINAL NEGATIVE REPORT   Report Status PENDING   Incomplete   WOUND CULTURE     Status: Normal   Collection Time   12/20/10  9:45 AM      Component Value Range Status Comment   Specimen Description BREAST   Final    Special Requests NONE   Final    Gram Stain     Final    Value: RARE WBC PRESENT, PREDOMINANTLY MONONUCLEAR     NO SQUAMOUS EPITHELIAL CELLS SEEN     NO ORGANISMS SEEN   Culture NO GROWTH 2 DAYS   Final    Report Status 12/22/2010 FINAL   Final   MRSA PCR SCREENING     Status: Normal   Collection Time   12/20/10  9:45 AM      Component Value Range Status Comment   MRSA by PCR NEGATIVE  NEGATIVE  Final   WOUND CULTURE (INCLUDES ANAEROBIC)     Status: Normal   Collection Time   12/23/10  2:05 PM      Component Value Range Status Comment   Specimen Description WOUND LEFT CHEST PORT A CATH POCKET   Final    Special Requests PATIENT ON FOLLOWING ZOSYN   Final    Gram Stain     Final    Value: RARE WBC PRESENT,BOTH PMN AND MONONUCLEAR     NO SQUAMOUS EPITHELIAL CELLS SEEN     NO ORGANISMS SEEN   Culture NO GROWTH 2 DAYS   Final    Report Status 12/26/2010 FINAL   Final   ANAEROBIC CULTURE     Status: Normal (Preliminary result)   Collection Time   12/23/10  2:05 PM      Component Value Range Status Comment   Specimen Description WOUND LEFT CHEST PORT A CATH POCKET   Final    Special Requests PATIENT ON FOLLOWING ZOSYN   Final    Gram Stain     Final    Value: RARE WBC PRESENT,BOTH PMN AND MONONUCLEAR     NO SQUAMOUS EPITHELIAL CELLS SEEN     NO ORGANISMS SEEN   Culture     Final     Value: NO ANAEROBES ISOLATED;  CULTURE IN PROGRESS FOR 5 DAYS   Report Status PENDING   Incomplete   CATH TIP CULTURE     Status: Normal   Collection Time   12/23/10  2:05 PM      Component Value Range Status Comment   Specimen Description CATH TIP LEFT CHEST PORT A CATH   Final    Special Requests PATIENT ON FOLLOWING ZOSYN   Final    Culture NO GROWTH 2 DAYS   Final    Report Status 12/26/2010 FINAL   Final     Studies/Results: No results found.  Medications: Scheduled Meds:   . ceFEPIme (MAXIPIME) 2 GM IVP  2 g Intravenous Q12H  . diphenhydrAMINE  50 mg Oral Once  . docusate sodium  100 mg Oral BID  . DULoxetine  60 mg Oral BID  . enoxaparin (LOVENOX) injection  1 mg/kg Subcutaneous Q12H  . fentaNYL  150 mcg Transdermal Q72H  . furosemide  20 mg Oral Daily  . gabapentin  900 mg Oral TID  . hydrochlorothiazide  25 mg Oral Daily  . HYDROmorphone      . lidocaine      . metoCLOPramide  10 mg Oral BID  . pantoprazole  40 mg Oral Q1200  . potassium chloride SA  20 mEq Oral BID  . predniSONE  50 mg Oral Q6H  . sodium chloride  3 mL Intravenous Q12H  . spironolactone  25 mg Oral Daily  . traZODone  150 mg Oral QHS  . DISCONTD: enoxaparin (LOVENOX) injection  40 mg Subcutaneous Q24H  . DISCONTD: spironolactone-hydrochlorothiazide  1 tablet Oral Daily   Continuous Infusions:   . sodium chloride    . 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 Her neck pain has improved and given her left IJ DVT, I suspect that her neck discomfort was secondary to the blood clot and therefore we'll cancel the MRI scans ordered to rule out discitis. Patient also does not want to have a TEE done and the TTE is not likely to be definitive. Since she is on a prolonged course of antibiotic therapy, I don't think a positive TEE would change our management anyway, so I have canceled the test. Active Problems:  Neck pain Likely secondary to IJ DVT given her symptomatic improvement with the initiation of therapeutic dose Lovenox.  DVT (deep venous thrombosis) of the left IJ The patient was started on therapeutic dose Lovenox.  CARCINOMA, BREAST The patient is status post bilateral mastectomy. She is not undergoing active treatment at the present time. She can followup with her oncologist post discharge.  PERSONALITY DISORDER Continue Cymbalta and when necessary Klonopin. Mood and affect are stable.  Chronic pain syndrome Pain is currently controlled.  Hypokalemia Followup with a BNET. Now that she has IV access, we should be able to obtain labs.  Edema extremities The patient's IV fluids were discontinued. She was given a dose of Lasix on 12/25/2010 with good diuresis. We have resumed her usual home medications with hydrochlorothiazide.  DIABETES MELLITUS, TYPE II No active treatment for at least  the past 5 years. We will obtain a hemoglobin A1c to check her glycemic control. Poor IV access A right internal jugular tunneled DL catheter was placed by interventional radiology. DC the peripheral IV in the right forearm.   LOS: 7 days   Hillery Aldo, MD Pager [phone]  12/26/2010, 9:04 AM

## 2010-12-26 NOTE — Procedures (Signed)
R IJ #[id number] tunneled DL catheter to SVC/RA jct No complication

## 2010-12-27 ENCOUNTER — Encounter (INDEPENDENT_AMBULATORY_CARE_PROVIDER_SITE_OTHER): Payer: Self-pay | Admitting: General Surgery

## 2010-12-27 ENCOUNTER — Encounter (HOSPITAL_COMMUNITY)
Admission: EM | Disposition: A | Discharge status: Home Health | Payer: Self-pay | Source: Home / Self Care | Attending: Internal Medicine

## 2010-12-27 DIAGNOSIS — D649 Anemia, unspecified: Secondary | ICD-10-CM | POA: Diagnosis present

## 2010-12-27 LAB — CULTURE, BLOOD (SINGLE): Culture  Setup Time: 201211120909

## 2010-12-27 LAB — CBC
Hemoglobin: 8.4 g/dL — ABNORMAL LOW (ref 12.0–15.0)
Platelets: 293 10*3/uL (ref 150–400)
RBC: 3.35 MIL/uL — ABNORMAL LOW (ref 3.87–5.11)

## 2010-12-27 LAB — GLUCOSE, CAPILLARY
Glucose-Capillary: 130 mg/dL — ABNORMAL HIGH (ref 70–99)
Glucose-Capillary: 114 mg/dL — ABNORMAL HIGH (ref 70–99)

## 2010-12-27 SURGERY — ECHOCARDIOGRAM, TRANSESOPHAGEAL
Anesthesia: Moderate Sedation

## 2010-12-27 MED ORDER — MAGNESIUM HYDROXIDE 400 MG/5ML PO SUSP
30.0000 mL | Freq: Every day | ORAL | Status: DC | PRN
  Administered 2010-12-27 – 2011-01-02 (×4): 30 mL via ORAL
  Filled 2010-12-27 (×3): qty 30

## 2010-12-27 MED ORDER — CEFAZOLIN SODIUM-DEXTROSE 2-3 GM-% IV SOLR
2.0000 g | Freq: Three times a day (TID) | INTRAVENOUS | Status: DC
  Administered 2010-12-27 – 2011-01-03 (×21): 2 g via INTRAVENOUS
  Filled 2010-12-27 (×25): qty 50

## 2010-12-27 MED ORDER — INSULIN ASPART 100 UNIT/ML ~~LOC~~ SOLN
0.0000 [IU] | Freq: Every day | SUBCUTANEOUS | Status: DC
  Administered 2010-12-31: 1 [IU] via SUBCUTANEOUS

## 2010-12-27 MED ORDER — HYDROMORPHONE HCL PF 2 MG/ML IJ SOLN
2.0000 mg | INTRAMUSCULAR | Status: DC | PRN
  Administered 2010-12-27 – 2011-01-01 (×45): 2 mg via INTRAVENOUS
  Filled 2010-12-27 (×46): qty 1

## 2010-12-27 MED ORDER — SODIUM CHLORIDE 0.9 % IV SOLN: 250.0000 mL | INTRAVENOUS | Status: DC

## 2010-12-27 MED ORDER — SODIUM CHLORIDE 0.9 % IJ SOLN: 3.0000 mL | INTRAMUSCULAR | Status: DC | PRN

## 2010-12-27 MED ORDER — MAGNESIUM HYDROXIDE NICU ORAL SYRINGE 400 MG/5 ML
30.0000 mL | Freq: Every day | ORAL | Status: DC | PRN
Start: 2010-12-27 — End: 2010-12-27
  Filled 2010-12-27: qty 30

## 2010-12-27 MED ORDER — SODIUM CHLORIDE 0.9 % IJ SOLN
3.0000 mL | Freq: Two times a day (BID) | INTRAMUSCULAR | Status: DC
  Administered 2010-12-27 – 2011-01-01 (×9): 3 mL via INTRAVENOUS
  Administered 2011-01-02: 22:00:00 via INTRAVENOUS
  Administered 2011-01-03: 3 mL via INTRAVENOUS

## 2010-12-27 MED ORDER — INSULIN ASPART 100 UNIT/ML ~~LOC~~ SOLN
0.0000 [IU] | Freq: Three times a day (TID) | SUBCUTANEOUS | Status: DC
  Administered 2010-12-28 – 2010-12-29 (×3): 1 [IU] via SUBCUTANEOUS
  Administered 2010-12-29 – 2010-12-30 (×2): 2 [IU] via SUBCUTANEOUS
  Administered 2010-12-30 – 2011-01-03 (×8): 1 [IU] via SUBCUTANEOUS
  Filled 2010-12-27: qty 3

## 2010-12-27 NOTE — Progress Notes (Signed)
Contacted [facility] about home care services @ home.  They say there is no support system,& not a safe home environment recommend SNF.

## 2010-12-27 NOTE — Progress Notes (Signed)
4 Days Post-Op portacath removal Subjective: Has chronic pain and some pain at surgical sites and neck/arm  Objective: Vital signs in last 24 hours:fTemp:  [97.8 F (36.6 C)-98.1 F (36.7 C)] 97.8 F (36.6 C) (11/19 0545) Pulse Rate:  [87-92] 87  (11/19 0545) Resp:  [16-18] 18  (11/19 0545) BP: (115-181)/(61-79) 125/66 mmHg (11/19 0545) SpO2:  [92 %-96 %] 96 % (11/19 0545) Last BM Date: 12/22/10  Intake/Output from previous day: 11/18 0701 - 11/19 0700 In: 733 [P.O.:240; I.V.:359; IV Piggyback:134] Out: 4200 [Urine:4200] Intake/Output this shift:    General appearance: alert and no distress Incision/Wound:Mastectomy and port removal incisions well healed without evidence of infection  Lab Results:   Basename 12/27/10 0516 12/26/10 1100  WBC 13.1* 8.0  HGB 8.4* 8.9*  HCT 28.0* 28.6*  PLT 293 228   BMET  Basename 12/26/10 1100  NA 137  K 3.6  CL 100  CO2 30  GLUCOSE 209*  BUN 10  CREATININE 0.90  CALCIUM 9.0   PT/INR No results found for this basename: LABPROT:2,INR:2 in the last 72 hours ABG No results found for this basename: PHART:2,PCO2:2,PO2:2,HCO3:2 in the last 72 hours  Studies/Results: Ir Fluoro Guide Cv Line Right  12/26/2010  *RADIOLOGY REPORT*  Clinical data: Breast cancer.  Wound infection, needs access for IV antibiotics. 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  RIGHT IJ TUNNELED CENTRAL VENOUS CATHETER PLACEMENT UNDER ULTRASOUND AND FLUOROSCOPIC GUIDANCE:  Technique and findings: The patient was premedicated for examination.  The procedure, risks (including but not limited to bleeding, infection, organ damage), benefits, and alternatives were explained to the patient.  Questions regarding the procedure were encouraged and answered.  The patient understands and consents to the procedure. Patency of the right IJ vein was confirmed with ultrasound with image documentation. Noncompressible thrombus was noted in the left  IJ vein.  An appropriate skin site was determined. Skin site was marked. Region was prepped using maximum barrier technique including cap and mask, sterile gown, sterile gloves, large sterile sheet, and Chlorhexidine   as cutaneous antisepsis.  The region was infiltrated locally with 1% lidocaine. Under real-time ultrasound guidance, the right IJ vein was accessed with a 21 gauge micropuncture needle; the needle tip within the vein was confirmed with ultrasound image documentation. A 5-French cuffed power injectable dual lumen catheter was tunneled from a right anterior chest wall approach to the IJ dermatotomy site.  The needle exchanged over a guidewire for peel-away sheath through which an the 5 [place] power injectable dual lumen catheter was advanced. This was positioned with the tip at the cavoatrial junction. Spot chest radiograph shows good positioning and no pneumothorax. Catheter was flushed and sutured externally with 0-Prolene sutures. IJ dermatotomy closed with Dermabond.  Patient tolerated the procedure well, with no immediate complication.  IMPRESSION: 1. Technically successful right IJ tunneled double lumen power injectable central venous catheter placement.  Original Report Authenticated By: Osa Craver, M.D.   Ir US Guide Vasc Access Right  12/26/2010  *RADIOLOGY REPORT*  Clinical data: Breast cancer.  Wound infection, needs access for IV antibiotics. 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  RIGHT IJ TUNNELED CENTRAL VENOUS CATHETER PLACEMENT UNDER ULTRASOUND AND FLUOROSCOPIC GUIDANCE:  Technique and findings: The patient was premedicated for examination.  The procedure, risks (including but not limited to bleeding, infection, organ damage), benefits, and alternatives were explained to the patient.  Questions regarding the procedure were encouraged and answered.  The patient  understands and consents to the procedure. Patency of the right IJ vein was  confirmed with ultrasound with image documentation. Noncompressible thrombus was noted in the left IJ vein.  An appropriate skin site was determined. Skin site was marked. Region was prepped using maximum barrier technique including cap and mask, sterile gown, sterile gloves, large sterile sheet, and Chlorhexidine   as cutaneous antisepsis.  The region was infiltrated locally with 1% lidocaine. Under real-time ultrasound guidance, the right IJ vein was accessed with a 21 gauge micropuncture needle; the needle tip within the vein was confirmed with ultrasound image documentation. A 5-French cuffed power injectable dual lumen catheter was tunneled from a right anterior chest wall approach to the IJ dermatotomy site.  The needle exchanged over a guidewire for peel-away sheath through which an the 5 [place] power injectable dual lumen catheter was advanced. This was positioned with the tip at the cavoatrial junction. Spot chest radiograph shows good positioning and no pneumothorax. Catheter was flushed and sutured externally with 0-Prolene sutures. IJ dermatotomy closed with Dermabond.  Patient tolerated the procedure well, with no immediate complication.  IMPRESSION: 1. Technically successful right IJ tunneled double lumen power injectable central venous catheter placement.  Original Report Authenticated By: Osa Craver, M.D.    Anti-infectives: Anti-infectives     Start     Dose/Rate Route Frequency Ordered Stop   12/22/10 1700   ceFEPIme (MAXIPIME) 2 g in dextrose 5 % 50 mL IVPB        2 g 100 mL/hr over 30 Minutes Intravenous Every 12 hours 12/22/10 1612     12/20/10 1500   vancomycin (VANCOCIN) IVPB 1000 mg/200 mL premix  Status:  Discontinued        1,000 mg 200 mL/hr over 60 Minutes Intravenous Every 12 hours 12/20/10 0910 12/22/10 1539   12/20/10 0300   vancomycin (VANCOCIN) IVPB 1000 mg/200 mL premix  Status:  Discontinued        1,000 mg 200 mL/hr over 60 Minutes Intravenous Every 8  hours 12/20/10 0256 12/20/10 0910   12/20/10 0150   piperacillin-tazobactam (ZOSYN) IVPB 3.375 g  Status:  Discontinued        3.375 g 12.5 mL/hr over 240 Minutes Intravenous 3 times per day 12/20/10 0150 12/22/10 1539   12/19/10 2245   piperacillin-tazobactam (ZOSYN) IVPB 3.375 g  Status:  Discontinued        3.375 g 12.5 mL/hr over 240 Minutes Intravenous 3 times per day 12/19/10 2243 12/19/10 2247          Assessment/Plan: s/p Procedure(s): REMOVAL PORT-A-CATH Principal Problem:  *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  No active surgical issues.  Will sign off.  Pt to call for office apt for 3 weeks   LOS: 8 days    Petrea Fredenburg T 12/27/2010

## 2010-12-27 NOTE — Progress Notes (Signed)
Subjective: Neck pain better, had dvt neck at site of IJ (removed)  Objective: Weight change:   Intake/Output Summary (Last 24 hours) at 12/27/10 1530 Last data filed at 12/27/10 0509  Gross per 24 hour  Intake    493 ml  Output   3200 ml  Net  -2707 ml   Blood pressure 125/66, pulse 87, temperature 97.8 F (36.6 C), temperature source Oral, resp. rate 18, height 5' 5.5" (1.664 m), weight 196 lb (88.905 kg), SpO2 96.00%. Temp:  [97.8 F (36.6 C)-98.1 F (36.7 C)] 97.8 F (36.6 C) (11/19 0545) Pulse Rate:  [87-92] 87  (11/19 0545) Resp:  [16-18] 18  (11/19 0545) BP: (115-125)/(61-66) 125/66 mmHg (11/19 0545) SpO2:  [94 %-96 %] 96 % (11/19 0545)  Physical Exam: General: Alert and awake, oriented x3, not in any acute distress. HEENT: anicteric sclera, pupils reactive to light and accommodation,   CVS regular rate, normal r,  no murmur rubs or gallops Chest: clear to auscultation bilaterally, no wheezing, rales or rhonchi Abdomen: soft nontender, nondistended, normal bowel sounds, Extremities: no  clubbing or edema noted bilaterally Skin: portacath site bandaged, new PICC right chest Lymph: no new lymphadenopathy Neuro: nonfocal  Lab Results:  Basename 12/27/10 0516 12/26/10 1100  WBC 13.1* 8.0  HGB 8.4* 8.9*  HCT 28.0* 28.6*  PLT 293 228   BMET  Basename 12/26/10 1100  NA 137  K 3.6  CL 100  CO2 30  GLUCOSE 209*  BUN 10  CREATININE 0.90  CALCIUM 9.0    Micro Results: Recent Results (from the past 240 hour(s))  CULTURE, BLOOD (SINGLE)     Status: Normal   Collection Time   12/20/10 12:10 AM      Component Value Range Status Comment   Specimen Description BLOOD LEFT CHEST PORT   Final    Special Requests BOTTLES DRAWN AEROBIC AND ANAEROBIC 5CC   Final    Setup Time [id number]   Final    Culture NO GROWTH 5 DAYS   Final    Report Status 12/26/2010 FINAL   Final   CULTURE, BLOOD (SINGLE)     Status: Normal   Collection Time   12/20/10 12:20 AM   Component Value Range Status Comment   Specimen Description BLOOD R HAND   Final    Special Requests BOTTLES DRAWN AEROBIC ONLY 5CC   Final    Setup Time [id number]   Final    Culture NO GROWTH 5 DAYS   Final    Report Status 12/26/2010 FINAL   Final   WOUND CULTURE     Status: Normal   Collection Time   12/20/10  9:45 AM      Component Value Range Status Comment   Specimen Description BREAST   Final    Special Requests NONE   Final    Gram Stain     Final    Value: RARE WBC PRESENT, PREDOMINANTLY MONONUCLEAR     NO SQUAMOUS EPITHELIAL CELLS SEEN     NO ORGANISMS SEEN   Culture NO GROWTH 2 DAYS   Final    Report Status 12/22/2010 FINAL   Final   MRSA PCR SCREENING     Status: Normal   Collection Time   12/20/10  9:45 AM      Component Value Range Status Comment   MRSA by PCR NEGATIVE  NEGATIVE  Final   WOUND CULTURE (INCLUDES ANAEROBIC)     Status: Normal   Collection Time   12/23/10  2:05 PM      Component Value Range Status Comment   Specimen Description WOUND LEFT CHEST PORT A CATH POCKET   Final    Special Requests PATIENT ON FOLLOWING ZOSYN   Final    Gram Stain     Final    Value: RARE WBC PRESENT,BOTH PMN AND MONONUCLEAR     NO SQUAMOUS EPITHELIAL CELLS SEEN     NO ORGANISMS SEEN   Culture NO GROWTH 2 DAYS   Final    Report Status 12/26/2010 FINAL   Final   ANAEROBIC CULTURE     Status: Normal (Preliminary result)   Collection Time   12/23/10  2:05 PM      Component Value Range Status Comment   Specimen Description WOUND LEFT CHEST PORT A CATH POCKET   Final    Special Requests PATIENT ON FOLLOWING ZOSYN   Final    Gram Stain     Final    Value: RARE WBC PRESENT,BOTH PMN AND MONONUCLEAR     NO SQUAMOUS EPITHELIAL CELLS SEEN     NO ORGANISMS SEEN   Culture     Final    Value: NO ANAEROBES ISOLATED; CULTURE IN PROGRESS FOR 5 DAYS   Report Status PENDING   Incomplete   CATH TIP CULTURE     Status: Normal   Collection Time   12/23/10  2:05 PM      Component  Value Range Status Comment   Specimen Description CATH TIP LEFT CHEST PORT A CATH   Final    Special Requests PATIENT ON FOLLOWING ZOSYN   Final    Culture NO GROWTH 2 DAYS   Final    Report Status 12/26/2010 FINAL   Final    12/20/2010: BLOOD CULTURE #1 Pantoea agglomerans Sensitive to cefepime 12/20/2010 BLOOD CULTURE NO GROWTH Studies/Results: Dg Chest 2 View  11/26/2010  *RADIOLOGY REPORT*  Clinical Data: Fever over 101.  Mid chest pain.  Bilateral mastectomies on 11/19/2010.  CHEST - 2 VIEW  Comparison: 11/19/2010  Findings: Normal heart size and pulmonary vascularity.  No focal airspace consolidation in the lungs.  No atelectasis.  No pneumothorax.  No pleural effusions.  Power port Infuse-A-Port with tip over the upper SVC region.  Skin clips in the anterior chest wall.  There is an air-fluid level in the subcutaneous soft tissues over the right lateral chest wall.  This could represent postoperative fluid collection of subcutaneous abscess should be considered, given the patient's history of fevers.  IMPRESSION: No evidence of active pulmonary disease.  Air-fluid levels in the subcutaneous tissues of the right lateral anterior chest wall may represent postoperative fluid collection or soft tissue abscess.  Original Report Authenticated By: Marlon Pel, M.D.   Ct Chest Wo Contrast  12/21/2010  *RADIOLOGY REPORT*  Clinical Data: Status post bilateral mastectomy with cellulitis and fever.  CT CHEST WITHOUT CONTRAST  Technique:  Multidetector CT imaging of the chest was performed following the standard protocol without IV contrast.  Comparison: None  Findings: There are skin staples and surgical changes from bilateral mastectomies.  Bilateral interstitial changes in the subcutaneous fat suggesting cellulitis.  There is a large postoperative fluid collection on the right side measuring 12 x 4 cm.  This could be a postoperative liquefied hematoma but cannot exclude an abscess.  The  Port-A-Cath is been good position without complicating features.  The underlying bony thorax is intact.  The heart is normal in size.  No pericardial effusion.  No mediastinal or  hilar lymphadenopathy.  The esophagus is grossly normal.  Examination of the lung parenchyma demonstrates no acute pulmonary findings.  No infiltrates, effusions or edema.  No worrisome pulmonary nodules or masses.  The upper abdomen is unremarkable.  The gallbladder is contracted.  IMPRESSION:  1.  Bilateral chest wall cellulitis. 2.  12 x 4 cm fluid collection in the subcutaneous fat on the right side could reflect a postoperative hematoma, seroma or abscess. 3.  No acute pulmonary findings.  Original Report Authenticated By: P. Loralie Champagne, M.D.   Dg Chest Port 1v Same Day  12/23/2010  *RADIOLOGY REPORT*  Clinical Data: Status post attempted central line placement.  PORTABLE CHEST - 1 VIEW SAME DAY  Comparison: Chest x-ray 11/26/2010.  Findings: Port-A-Cath has been removed.  Surgical staples are seen over the right and left chest.  There is mild left basilar atelectasis.  There is no visible pneumothorax.  IMPRESSION: No visible pneumothorax.  Mild left basilar atelectasis.  Original Report Authenticated By: Elsie Stain, M.D.    Antibiotics:  Anti-infectives     Start     Dose/Rate Route Frequency Ordered Stop   12/22/10 1700   ceFEPIme (MAXIPIME) 2 g in dextrose 5 % 50 mL IVPB        2 g 100 mL/hr over 30 Minutes Intravenous Every 12 hours 12/22/10 1612     12/20/10 1500   vancomycin (VANCOCIN) IVPB 1000 mg/200 mL premix  Status:  Discontinued        1,000 mg 200 mL/hr over 60 Minutes Intravenous Every 12 hours 12/20/10 0910 12/22/10 1539   12/20/10 0300   vancomycin (VANCOCIN) IVPB 1000 mg/200 mL premix  Status:  Discontinued        1,000 mg 200 mL/hr over 60 Minutes Intravenous Every 8 hours 12/20/10 0256 12/20/10 0910   12/20/10 0150   piperacillin-tazobactam (ZOSYN) IVPB 3.375 g  Status:  Discontinued          3.375 g 12.5 mL/hr over 240 Minutes Intravenous 3 times per day 12/20/10 0150 12/22/10 1539   12/19/10 2245   piperacillin-tazobactam (ZOSYN) IVPB 3.375 g  Status:  Discontinued        3.375 g 12.5 mL/hr over 240 Minutes Intravenous 3 times per day 12/19/10 2243 12/19/10 2247          Medications: Scheduled Meds:    . ceFEPIme (MAXIPIME) 2 GM IVP  2 g Intravenous Q12H  . docusate sodium  100 mg Oral BID  . DULoxetine  60 mg Oral BID  . enoxaparin (LOVENOX) injection  1 mg/kg Subcutaneous Q12H  . fentaNYL  150 mcg Transdermal Q72H  . gabapentin  900 mg Oral TID  . hydrochlorothiazide  25 mg Oral Daily  . insulin aspart  0-5 Units Subcutaneous QHS  . insulin aspart  0-9 Units Subcutaneous TID WC  . lidocaine      . metoCLOPramide  10 mg Oral BID  . pantoprazole  40 mg Oral Q1200  . potassium chloride SA  20 mEq Oral BID  . sodium chloride  3 mL Intravenous Q12H  . spironolactone  25 mg Oral Daily  . traZODone  150 mg Oral QHS  . DISCONTD: sodium chloride  3 mL Intravenous Q12H   Continuous Infusions:    . sodium chloride    . 0.9 % NaCl with KCl 20 mEq / L 20 mL/hr at 12/26/10 2125  . DISCONTD: sodium chloride     PRN Meds:.acetaminophen, acetaminophen, alum & mag hydroxide-simeth, carisoprodol, clonazePAM,  HYDROcodone-acetaminophen, HYDROmorphone (DILAUDID) injection, magnesium hydroxide, ondansetron (ZOFRAN) IV, ondansetron, promethazine, promethazine, sodium chloride, DISCONTD:  HYDROmorphone (DILAUDID) injection, DISCONTD: sodium chloride  Assessment/Plan: [name] Carr is a 55 y.o. 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 She was readmitted with fevers on antibiotics. One of two blood cultures at [facility] grew a Pantoea agglomerans (formerly known as enterobacter agglomerans), cultures here at [place] [facility] from blood and from portacath after removal are without growth  Portacath infection: COag negative Staph is likely  the sole culprit. I think the Pantoea agglomerans  that grew was likely a contaminant --will change to IV ancef and treat for 6 weeks total with weekly check of BMp, cbc faxed to me at [phone] --check 2decho --I am happy to see in [facility] clinic for followup, please call [name] Carr at [phone] to schedule followup (would plan on seeing her about 5 weeks) --otherwise I will sign off for now   Bacteremia: --see above discussion and plan    LOS: 8 days   Paulette Blanch Dam 12/27/2010, 3:30 PM

## 2010-12-27 NOTE — Progress Notes (Signed)
ANTIBIOTIC CONSULT NOTE - INITIAL  Pharmacy Consult for Cefazolin  Indication: bacteremia   Allergies  Allergen Reactions  . Versed Anaphylaxis  . Infliximab Nausea And Vomiting  . Iohexol      Code: SOB   . Ketorolac Tromethamine     REACTION: but pt states not injectable  . Methotrexate Other (See Comments)    unknown  . Nalbuphine Other (See Comments)    unknown  . Nsaids Nausea And Vomiting  . Celecoxib Rash    Patient Measurements: Height: 5' 5.5" (166.4 cm) Weight: 196 lb (88.905 kg) IBW/kg (Calculated) : 58.15   Vital Signs: Temp: 98.7 F (37.1 C) (11/19 2121) Temp src: Oral (11/19 2121) BP: 179/75 mmHg (11/19 2121) Pulse Rate: 80  (11/19 2121) Intake/Output from previous day: 11/18 0701 - 11/19 0700 In: 733 [P.O.:240; I.V.:359; IV Piggyback:134] Out: 4200 [Urine:4200] Intake/Output from this shift: Total I/O In: 350 [P.O.:350] Out: 1500 [Urine:1500]  Labs:  [facility] 12/27/10 0516 12/26/10 1100  WBC 13.1* 8.0  HGB 8.4* 8.9*  PLT 293 228  LABCREA -- --  CREATININE -- 0.90   Estimated Creatinine Clearance: 77.7 ml/min (by C-G formula based on Cr of 0.9). No results found for this basename: VANCOTROUGH:2,VANCOPEAK:2,VANCORANDOM:2,GENTTROUGH:2,GENTPEAK:2,GENTRANDOM:2,TOBRATROUGH:2,TOBRAPEAK:2,TOBRARND:2,AMIKACINPEAK:2,AMIKACINTROU:2,AMIKACIN:2, in the last 72 hours   Microbiology: Recent Results (from the past 720 hour(s))  CULTURE, BLOOD (ROUTINE SINGLE)     Status: Normal   Collection Time   12/07/10 10:25 AM      Component Value Range Status Comment   Specimen Description BLOOD   Final    Special Requests BOTTLES DRAWN AEROBIC AND ANAEROBIC   Final    Setup Time [id number]   Final    Culture NO GROWTH 5 DAYS   Final    Report Status 12/13/2010 FINAL   Final   CULTURE, BLOOD (SINGLE)     Status: Normal   Collection Time   12/20/10 12:10 AM      Component Value Range Status Comment   Specimen Description BLOOD LEFT CHEST PORT   Final    Special Requests BOTTLES DRAWN AEROBIC AND ANAEROBIC 5CC   Final    Setup Time [id number]   Final    Culture NO GROWTH 5 DAYS   Final    Report Status 12/26/2010 FINAL   Final   CULTURE, BLOOD (SINGLE)     Status: Normal   Collection Time   12/20/10 12:20 AM      Component Value Range Status Comment   Specimen Description BLOOD R HAND   Final    Special Requests BOTTLES DRAWN AEROBIC ONLY 5CC   Final    Setup Time [id number]   Final    Culture NO GROWTH 5 DAYS   Final    Report Status 12/26/2010 FINAL   Final   WOUND CULTURE     Status: Normal   Collection Time   12/20/10  9:45 AM      Component Value Range Status Comment   Specimen Description BREAST   Final    Special Requests NONE   Final    Gram Stain     Final    Value: RARE WBC PRESENT, PREDOMINANTLY MONONUCLEAR     NO SQUAMOUS EPITHELIAL CELLS SEEN     NO ORGANISMS SEEN   Culture NO GROWTH 2 DAYS   Final    Report Status 12/22/2010 FINAL   Final   MRSA PCR SCREENING     Status: Normal   Collection Time   12/20/10  9:45 AM  Component Value Range Status Comment   MRSA by PCR NEGATIVE  NEGATIVE  Final   WOUND CULTURE (INCLUDES ANAEROBIC)     Status: Normal   Collection Time   12/23/10  2:05 PM      Component Value Range Status Comment   Specimen Description WOUND LEFT CHEST PORT A CATH POCKET   Final    Special Requests PATIENT ON FOLLOWING ZOSYN   Final    Gram Stain     Final    Value: RARE WBC PRESENT,BOTH PMN AND MONONUCLEAR     NO SQUAMOUS EPITHELIAL CELLS SEEN     NO ORGANISMS SEEN   Culture NO GROWTH 2 DAYS   Final    Report Status 12/26/2010 FINAL   Final   ANAEROBIC CULTURE     Status: Normal (Preliminary result)   Collection Time   12/23/10  2:05 PM      Component Value Range Status Comment   Specimen Description WOUND LEFT CHEST PORT A CATH POCKET   Final    Special Requests PATIENT ON FOLLOWING ZOSYN   Final    Gram Stain     Final    Value: RARE WBC PRESENT,BOTH PMN AND MONONUCLEAR     NO  SQUAMOUS EPITHELIAL CELLS SEEN     NO ORGANISMS SEEN   Culture     Final    Value: NO ANAEROBES ISOLATED; CULTURE IN PROGRESS FOR 5 DAYS   Report Status PENDING   Incomplete   CATH TIP CULTURE     Status: Normal   Collection Time   12/23/10  2:05 PM      Component Value Range Status Comment   Specimen Description CATH TIP LEFT CHEST PORT A CATH   Final    Special Requests PATIENT ON FOLLOWING ZOSYN   Final    Culture NO GROWTH 2 DAYS   Final    Report Status 12/26/2010 FINAL   Final     Medical History: Past Medical History  Diagnosis Date  . PERSONALITY DISORDER   . Chronic pain syndrome     chronic narcotics, dependancy hx - [place] for pain mgmt  . MIGRAINE HEADACHE   . Lyme disease   . ALLERGIC RHINITIS   . ANEMIA-IRON DEFICIENCY   . HYPERLIPIDEMIA   . GERD   . DIABETES MELLITUS, TYPE II   . DEPRESSION   . SEIZURE DISORDER   . CARCINOMA, BREAST 08/2009 dx    R breast, reports mets to liver  . SLE (systemic lupus erythematosus)     [facility] - chronic pred  . Psoriasis   . Addison's disease   . Metastases to the liver   . Degenerative joint disease   . Migraine headache   . DDD (degenerative disc disease)     Medications:  Scheduled:    . ceFAZolin (ANCEF) IV  2 g Intravenous Q8H  . docusate sodium  100 mg Oral BID  . DULoxetine  60 mg Oral BID  . enoxaparin (LOVENOX) injection  1 mg/kg Subcutaneous Q12H  . fentaNYL  150 mcg Transdermal Q72H  . gabapentin  900 mg Oral TID  . hydrochlorothiazide  25 mg Oral Daily  . insulin aspart  0-5 Units Subcutaneous QHS  . insulin aspart  0-9 Units Subcutaneous TID WC  . metoCLOPramide  10 mg Oral BID  . pantoprazole  40 mg Oral Q1200  . potassium chloride SA  20 mEq Oral BID  . sodium chloride  3 mL Intravenous Q12H  .  spironolactone  25 mg Oral Daily  . traZODone  150 mg Oral QHS  . DISCONTD: ceFEPIme (MAXIPIME) 2 GM IVP  2 g Intravenous Q12H  . DISCONTD: sodium chloride  3 mL Intravenous Q12H    Infusions:    . sodium chloride    . 0.9 % NaCl with KCl 20 mEq / L 20 mL/hr at 12/26/10 2125  . DISCONTD: sodium chloride     Assessment:  55 YOF with portacath associated coag neg staph bacteremia to start cefazolin per ID.  Per ID, blood cultures x 2 at [facility] positive for Pantoea agglomerans.  All cultures at [facility] = no growth.  Dr. Daiva Eves thinks Pantoea agglomerans was likely a contaminant and plan to start ancef for 6 weeks.  Good renal function  Plan:   Ancef 2gm IV q8hrs (first dose received at 1932 today)  F/u plans for IV abx outpatient  Geoffry Paradise Thi 12/27/2010,9:47 PM

## 2010-12-27 NOTE — Progress Notes (Signed)
Spoke to patient about d/c plans. She prefers plans to go to a rehab facility for her iv abx. SW notified. She has used [facility] in past.

## 2010-12-27 NOTE — Progress Notes (Signed)
Inpatient Diabetes Program Recommendations  AACE/ADA: New Consensus Statement on Inpatient Glycemic Control (2009)  Target Ranges:  Prepandial:   less than 140 mg/dL      Peak postprandial:   less than 180 mg/dL (1-2 hours)      Critically ill patients:  140 - 180 mg/dL   Reason for Visit:  Pt with + History of Diabetes Lab glucose 11/18: 209 mg/dl  Inpatient Diabetes Program Recommendations Correction (SSI): Please check CBGs tid ac + HS and cover with Novolog Sensitive correction scale if elevated. HgbA1C: Good control at home- A1C 6.1% (12/26/10)  Note:

## 2010-12-27 NOTE — Progress Notes (Addendum)
Interval History: [name] Carr 55 year old female who was admitted on 12/19/2010 with fever altered mental status in the setting of being treated for a staph surgical wound infection. She had been on IV vancomycin at home when she developed a fever as high as 103.8. She was placed on broad-spectrum antibiotics. A CT scan of her chest was done on 12/21/2010 which showed bilateral chest wall cellulitis and a 12x4 centimeter fluid collection in the subcutaneous fat on the right. General surgery and the infectious disease physicians were subsequently consulted. The patient had her Port-A-Cath removed on 12/23/2010. Her hospital course has been complicated by difficulty obtaining IV access. Neither of her arms were considered good sources for IV access due to her history of bilateral mastectomy and risk of lymphedema. Her Port-A-Cath had been removed and she had active cellulitis to her chest wall preventing access through the chest wall. The critical care team attempted to put a central line into her right internal jugular vein but they were unable to thread the catheter and put a peripheral line in that became dislodged shortly afterwards. She also developed a DVT involving the left internal jugular.and is now on therapeutic dose lovenox. The patient ultimately had a right IJ tunnel catheter placed by the interventional radiologist on 12/26/2010.   ROS: [name] Carr is complaining of severe left arm pain and neck pain that has become worse.  (Lovenox placed on hold x 24 hours while awaiting central line placement.).  She is constipated.  No nausea or vomiting.  Appetite is good.   Objective: Vital signs in last 24 hours: Temp:  [97.8 F (36.6 C)-98.1 F (36.7 C)] 97.8 F (36.6 C) (11/19 0545) Pulse Rate:  [87-92] 87  (11/19 0545) Resp:  [16-18] 18  (11/19 0545) BP: (115-181)/(61-79) 125/66 mmHg (11/19 0545) SpO2:  [92 %-96 %] 96 % (11/19 0545) Weight change:  Last BM Date: 12/22/10  Intake/Output from  previous day:  Intake/Output Summary (Last 24 hours) at 12/27/10 0834 Last data filed at 12/27/10 0509  Gross per 24 hour  Intake    733 ml  Output   4200 ml  Net  -3467 ml     Physical Exam:  Gen: No acute distress.  Cardiovascular: Heart sounds regular. No murmurs, rubs, or gallops.  Respiratory: Lungs clear to auscultation bilaterally.  Gastrointestinal: Abdomen soft, nontender, nondistended with normal active bowel sounds.  Extremities: Decreased edema, now at 1+.   Lab Results: CBC    Component Value Date/Time   WBC 13.1* 12/27/2010 0516   WBC 11.0* 09/23/2010 1425   RBC 3.35* 12/27/2010 0516   HGB 8.4* 12/27/2010 0516   HGB 13.2 09/23/2010 1425   HCT 28.0* 12/27/2010 0516   HCT 39.3 09/23/2010 1425   PLT 293 12/27/2010 0516   PLT 181 09/23/2010 1425   MCV 83.6 12/27/2010 0516   MCV 84.0 09/23/2010 1425   MCH 25.1* 12/27/2010 0516   MCHC 30.0 12/27/2010 0516   RDW 16.0* 12/27/2010 0516   LYMPHSABS 2.7 11/30/2010 0655   MONOABS 1.7* 11/30/2010 0655   EOSABS 0.1 11/30/2010 0655   EOSABS 0.1 09/23/2010 1425   BASOSABS 0.0 11/30/2010 0655   BASOSABS 0.0 09/23/2010 1425    BMET    Component Value Date/Time   NA 137 12/26/2010 1100   K 3.6 12/26/2010 1100   CL 100 12/26/2010 1100   CO2 30 12/26/2010 1100   GLUCOSE 209* 12/26/2010 1100   BUN 10 12/26/2010 1100   CREATININE 0.90 12/26/2010 1100   CALCIUM  9.0 12/26/2010 1100   GFRNONAA 70* 12/26/2010 1100   GFRAA 81* 12/26/2010 1100    Recent Results (from the past 240 hour(s))  CULTURE, BLOOD (SINGLE)     Status: Normal   Collection Time   12/20/10 12:10 AM      Component Value Range Status Comment   Specimen Description BLOOD LEFT CHEST PORT   Final    Special Requests BOTTLES DRAWN AEROBIC AND ANAEROBIC 5CC   Final    Setup Time [id number]   Final    Culture NO GROWTH 5 DAYS   Final    Report Status 12/26/2010 FINAL   Final   CULTURE, BLOOD (SINGLE)     Status: Normal (Preliminary result)   Collection  Time   12/20/10 12:20 AM      Component Value Range Status Comment   Specimen Description BLOOD R HAND   Final    Special Requests BOTTLES DRAWN AEROBIC ONLY 5CC   Final    Setup Time [id number]   Final    Culture     Final    Value:        BLOOD CULTURE RECEIVED NO GROWTH TO DATE CULTURE WILL BE HELD FOR 5 DAYS BEFORE ISSUING A FINAL NEGATIVE REPORT   Report Status PENDING   Incomplete   WOUND CULTURE     Status: Normal   Collection Time   12/20/10  9:45 AM      Component Value Range Status Comment   Specimen Description BREAST   Final    Special Requests NONE   Final    Gram Stain     Final    Value: RARE WBC PRESENT, PREDOMINANTLY MONONUCLEAR     NO SQUAMOUS EPITHELIAL CELLS SEEN     NO ORGANISMS SEEN   Culture NO GROWTH 2 DAYS   Final    Report Status 12/22/2010 FINAL   Final   MRSA PCR SCREENING     Status: Normal   Collection Time   12/20/10  9:45 AM      Component Value Range Status Comment   MRSA by PCR NEGATIVE  NEGATIVE  Final   WOUND CULTURE (INCLUDES ANAEROBIC)     Status: Normal   Collection Time   12/23/10  2:05 PM      Component Value Range Status Comment   Specimen Description WOUND LEFT CHEST PORT A CATH POCKET   Final    Special Requests PATIENT ON FOLLOWING ZOSYN   Final    Gram Stain     Final    Value: RARE WBC PRESENT,BOTH PMN AND MONONUCLEAR     NO SQUAMOUS EPITHELIAL CELLS SEEN     NO ORGANISMS SEEN   Culture NO GROWTH 2 DAYS   Final    Report Status 12/26/2010 FINAL   Final   ANAEROBIC CULTURE     Status: Normal (Preliminary result)   Collection Time   12/23/10  2:05 PM      Component Value Range Status Comment   Specimen Description WOUND LEFT CHEST PORT A CATH POCKET   Final    Special Requests PATIENT ON FOLLOWING ZOSYN   Final    Gram Stain     Final    Value: RARE WBC PRESENT,BOTH PMN AND MONONUCLEAR     NO SQUAMOUS EPITHELIAL CELLS SEEN     NO ORGANISMS SEEN   Culture     Final    Value: NO ANAEROBES ISOLATED; CULTURE IN PROGRESS FOR 5  DAYS   Report Status PENDING  Incomplete   CATH TIP CULTURE     Status: Normal   Collection Time   12/23/10  2:05 PM      Component Value Range Status Comment   Specimen Description CATH TIP LEFT CHEST PORT A CATH   Final    Special Requests PATIENT ON FOLLOWING ZOSYN   Final    Culture NO GROWTH 2 DAYS   Final    Report Status 12/26/2010 FINAL   Final     Studies/Results: Ir Fluoro Guide Cv Line Right  12/26/2010  *RADIOLOGY REPORT*  Clinical data: Breast cancer.  Wound infection, needs access for IV antibiotics. 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  RIGHT IJ TUNNELED CENTRAL VENOUS CATHETER PLACEMENT UNDER ULTRASOUND AND FLUOROSCOPIC GUIDANCE:  Technique and findings: The patient was premedicated for examination.  The procedure, risks (including but not limited to bleeding, infection, organ damage), benefits, and alternatives were explained to the patient.  Questions regarding the procedure were encouraged and answered.  The patient understands and consents to the procedure. Patency of the right IJ vein was confirmed with ultrasound with image documentation. Noncompressible thrombus was noted in the left IJ vein.  An appropriate skin site was determined. Skin site was marked. Region was prepped using maximum barrier technique including cap and mask, sterile gown, sterile gloves, large sterile sheet, and Chlorhexidine   as cutaneous antisepsis.  The region was infiltrated locally with 1% lidocaine. Under real-time ultrasound guidance, the right IJ vein was accessed with a 21 gauge micropuncture needle; the needle tip within the vein was confirmed with ultrasound image documentation. A 5-French cuffed power injectable dual lumen catheter was tunneled from a right anterior chest wall approach to the IJ dermatotomy site.  The needle exchanged over a guidewire for peel-away sheath through which an the 5 [place] power injectable dual lumen catheter was advanced. This  was positioned with the tip at the cavoatrial junction. Spot chest radiograph shows good positioning and no pneumothorax. Catheter was flushed and sutured externally with 0-Prolene sutures. IJ dermatotomy closed with Dermabond.  Patient tolerated the procedure well, with no immediate complication.  IMPRESSION: 1. Technically successful right IJ tunneled double lumen power injectable central venous catheter placement.  Original Report Authenticated By: Osa Craver, M.D.   Ir US Guide Vasc Access Right  12/26/2010  *RADIOLOGY REPORT*  Clinical data: Breast cancer.  Wound infection, needs access for IV antibiotics. 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  RIGHT IJ TUNNELED CENTRAL VENOUS CATHETER PLACEMENT UNDER ULTRASOUND AND FLUOROSCOPIC GUIDANCE:  Technique and findings: The patient was premedicated for examination.  The procedure, risks (including but not limited to bleeding, infection, organ damage), benefits, and alternatives were explained to the patient.  Questions regarding the procedure were encouraged and answered.  The patient understands and consents to the procedure. Patency of the right IJ vein was confirmed with ultrasound with image documentation. Noncompressible thrombus was noted in the left IJ vein.  An appropriate skin site was determined. Skin site was marked. Region was prepped using maximum barrier technique including cap and mask, sterile gown, sterile gloves, large sterile sheet, and Chlorhexidine   as cutaneous antisepsis.  The region was infiltrated locally with 1% lidocaine. Under real-time ultrasound guidance, the right IJ vein was accessed with a 21 gauge micropuncture needle; the needle tip within the vein was confirmed with ultrasound image documentation. A 5-French cuffed power injectable dual lumen catheter was tunneled from a right anterior chest wall approach  to the IJ dermatotomy site.  The needle exchanged over a guidewire for  peel-away sheath through which an the 5 [place] power injectable dual lumen catheter was advanced. This was positioned with the tip at the cavoatrial junction. Spot chest radiograph shows good positioning and no pneumothorax. Catheter was flushed and sutured externally with 0-Prolene sutures. IJ dermatotomy closed with Dermabond.  Patient tolerated the procedure well, with no immediate complication.  IMPRESSION: 1. Technically successful right IJ tunneled double lumen power injectable central venous catheter placement.  Original Report Authenticated By: Osa Craver, M.D.    Medications: Scheduled Meds:   . ceFEPIme (MAXIPIME) 2 GM IVP  2 g Intravenous Q12H  . docusate sodium  100 mg Oral BID  . DULoxetine  60 mg Oral BID  . enoxaparin (LOVENOX) injection  1 mg/kg Subcutaneous Q12H  . fentaNYL  150 mcg Transdermal Q72H  . gabapentin  900 mg Oral TID  . hydrochlorothiazide  25 mg Oral Daily  . lidocaine      . metoCLOPramide  10 mg Oral BID  . pantoprazole  40 mg Oral Q1200  . potassium chloride SA  20 mEq Oral BID  . sodium chloride  3 mL Intravenous Q12H  . sodium chloride  3 mL Intravenous Q12H  . spironolactone  25 mg Oral Daily  . traZODone  150 mg Oral QHS  . DISCONTD: fentaNYL  150 mcg Transdermal Q72H  . DISCONTD: fentaNYL  175 mcg Transdermal Q72H   Continuous Infusions:   . sodium chloride    . sodium chloride    . 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 Her neck pain is a bit worse today, and given her left IJ DVT, I suspect that her neck discomfort is secondary to the blood clot and therefore we canceled the MRI scans ordered to rule out discitis. Patient also does not want to have a TEE done and the TTE is not likely to be definitive. Since she is on a prolonged course of antibiotic therapy, I don't think a positive TEE would change our management anyway, so I have canceled the test.  Active Problems:  Neck pain  Likely secondary to IJ DVT given her symptomatic improvement with the initiation of therapeutic dose Lovenox. Lovenox is scheduled to be resumed today. DVT (deep venous thrombosis) of the left IJ  The patient was started on therapeutic dose Lovenox, which will be resumed today.  CARCINOMA, BREAST  The patient is status post bilateral mastectomy. She is not undergoing active treatment at the present time. She can followup with her oncologist post discharge.  PERSONALITY DISORDER  Continue Cymbalta and when necessary Klonopin. Mood and affect are stable.  Chronic pain syndrome  Pain is currently not controlled.  I have increased her dilaudid to 2 mg every 2 hours as needed, but have told her that this is for short-term only.  Hypokalemia  Corrected. Edema extremities  The patient's IV fluids were discontinued. She was given a dose of Lasix on 12/25/2010 with good diuresis. We have resumed her  usual home medications with hydrochlorothiazide. Her edema is gradually improving. DIABETES MELLITUS, TYPE II  No active treatment for at least the past 5 years. Her hemoglobin A1c is 6.1. SSI added. Poor IV access  A right internal jugular tunneled DL catheter was placed by interventional radiology.     LOS: 8 days   Hillery Aldo, MD Pager [phone]  12/27/2010, 8:34 AM

## 2010-12-28 ENCOUNTER — Encounter (INDEPENDENT_AMBULATORY_CARE_PROVIDER_SITE_OTHER): Payer: Self-pay | Admitting: General Surgery

## 2010-12-28 LAB — GLUCOSE, CAPILLARY: Glucose-Capillary: 104 mg/dL — ABNORMAL HIGH (ref 70–99)

## 2010-12-28 LAB — ANAEROBIC CULTURE

## 2010-12-28 MED ORDER — CEFAZOLIN SODIUM-DEXTROSE 2-3 GM-% IV SOLR: 2.0000 g | Freq: Three times a day (TID) | INTRAVENOUS | Status: DC

## 2010-12-28 MED ORDER — ENOXAPARIN SODIUM 150 MG/ML ~~LOC~~ SOLN: 130.0000 mg | SUBCUTANEOUS | Status: DC

## 2010-12-28 MED ORDER — HYDROMORPHONE HCL 2 MG PO TABS: 4.0000 mg | ORAL_TABLET | ORAL | Status: DC | PRN

## 2010-12-28 MED ORDER — ENOXAPARIN SODIUM 150 MG/ML ~~LOC~~ SOLN
130.0000 mg | SUBCUTANEOUS | Status: DC
  Administered 2010-12-28 – 2011-01-02 (×6): 130 mg via SUBCUTANEOUS
  Filled 2010-12-28 (×7): qty 1

## 2010-12-28 NOTE — Progress Notes (Signed)
Patient now wants home w/HH.[facility] contacted & will follow for [facility] RN-IV abx,will need orders,& picc line flush care.

## 2010-12-28 NOTE — Progress Notes (Signed)
Subjective: Sleeping comfortably  Objective: Weight change:   Intake/Output Summary (Last 24 hours) at 12/28/10 1153 Last data filed at 12/28/10 0500  Gross per 24 hour  Intake   1424 ml  Output   4600 ml  Net  -3176 ml   Blood pressure 147/99, pulse 113, temperature 98.4 F (36.9 C), temperature source Oral, resp. rate 20, height 5' 5.5" (1.664 m), weight 196 lb (88.905 kg), SpO2 98.00%. Temp:  [98.2 F (36.8 C)-98.7 F (37.1 C)] 98.4 F (36.9 C) (11/20 0536) Pulse Rate:  [80-113] 113  (11/20 0536) Resp:  [16-20] 20  (11/20 0536) BP: (112-179)/(70-99) 147/99 mmHg (11/20 0536) SpO2:  [80 %-98 %] 98 % (11/20 0536)  Physical Exam: General: Alert and awake, oriented x3, not in any acute distress. HEENT: anicteric sclera, pupils reactive to light and accommodation,   CVS regular rate, normal r,  no murmur rubs or gallops Chest: clear to auscultation bilaterally, no wheezing, rales or rhonchi Abdomen: soft nontender, nondistended, normal bowel sounds, Extremities: no  clubbing or edema noted bilaterally Skin: portacath site bandaged, new PICC right chest Lymph: no new lymphadenopathy Neuro: nonfocal  Lab Results:  Basename 12/27/10 0516 12/26/10 1100  WBC 13.1* 8.0  HGB 8.4* 8.9*  HCT 28.0* 28.6*  PLT 293 228   BMET  Basename 12/26/10 1100  NA 137  K 3.6  CL 100  CO2 30  GLUCOSE 209*  BUN 10  CREATININE 0.90  CALCIUM 9.0    Micro Results: Recent Results (from the past 240 hour(s))  CULTURE, BLOOD (SINGLE)     Status: Normal   Collection Time   12/20/10 12:10 AM      Component Value Range Status Comment   Specimen Description BLOOD LEFT CHEST PORT   Final    Special Requests BOTTLES DRAWN AEROBIC AND ANAEROBIC 5CC   Final    Setup Time [id number]   Final    Culture NO GROWTH 5 DAYS   Final    Report Status 12/26/2010 FINAL   Final   CULTURE, BLOOD (SINGLE)     Status: Normal   Collection Time   12/20/10 12:20 AM      Component Value Range Status  Comment   Specimen Description BLOOD R HAND   Final    Special Requests BOTTLES DRAWN AEROBIC ONLY 5CC   Final    Setup Time [id number]   Final    Culture NO GROWTH 5 DAYS   Final    Report Status 12/26/2010 FINAL   Final   WOUND CULTURE     Status: Normal   Collection Time   12/20/10  9:45 AM      Component Value Range Status Comment   Specimen Description BREAST   Final    Special Requests NONE   Final    Gram Stain     Final    Value: RARE WBC PRESENT, PREDOMINANTLY MONONUCLEAR     NO SQUAMOUS EPITHELIAL CELLS SEEN     NO ORGANISMS SEEN   Culture NO GROWTH 2 DAYS   Final    Report Status 12/22/2010 FINAL   Final   MRSA PCR SCREENING     Status: Normal   Collection Time   12/20/10  9:45 AM      Component Value Range Status Comment   MRSA by PCR NEGATIVE  NEGATIVE  Final   WOUND CULTURE (INCLUDES ANAEROBIC)     Status: Normal   Collection Time   12/23/10  2:05 PM  Component Value Range Status Comment   Specimen Description WOUND LEFT CHEST PORT A CATH POCKET   Final    Special Requests PATIENT ON FOLLOWING ZOSYN   Final    Gram Stain     Final    Value: RARE WBC PRESENT,BOTH PMN AND MONONUCLEAR     NO SQUAMOUS EPITHELIAL CELLS SEEN     NO ORGANISMS SEEN   Culture NO GROWTH 2 DAYS   Final    Report Status 12/26/2010 FINAL   Final   ANAEROBIC CULTURE     Status: Normal   Collection Time   12/23/10  2:05 PM      Component Value Range Status Comment   Specimen Description WOUND LEFT CHEST PORT A CATH POCKET   Final    Special Requests PATIENT ON FOLLOWING ZOSYN   Final    Gram Stain     Final    Value: RARE WBC PRESENT,BOTH PMN AND MONONUCLEAR     NO SQUAMOUS EPITHELIAL CELLS SEEN     NO ORGANISMS SEEN   Culture NO ANAEROBES ISOLATED   Final    Report Status 12/28/2010 FINAL   Final   CATH TIP CULTURE     Status: Normal   Collection Time   12/23/10  2:05 PM      Component Value Range Status Comment   Specimen Description CATH TIP LEFT CHEST PORT A CATH   Final      Special Requests PATIENT ON FOLLOWING ZOSYN   Final    Culture NO GROWTH 2 DAYS   Final    Report Status 12/26/2010 FINAL   Final    12/20/2010: BLOOD CULTURE #1 Pantoea agglomerans Sensitive to cefepime 12/20/2010 BLOOD CULTURE NO GROWTH Studies/Results: Dg Chest 2 View  11/26/2010  *RADIOLOGY REPORT*  Clinical Data: Fever over 101.  Mid chest pain.  Bilateral mastectomies on 11/19/2010.  CHEST - 2 VIEW  Comparison: 11/19/2010  Findings: Normal heart size and pulmonary vascularity.  No focal airspace consolidation in the lungs.  No atelectasis.  No pneumothorax.  No pleural effusions.  Power port Infuse-A-Port with tip over the upper SVC region.  Skin clips in the anterior chest wall.  There is an air-fluid level in the subcutaneous soft tissues over the right lateral chest wall.  This could represent postoperative fluid collection of subcutaneous abscess should be considered, given the patient's history of fevers.  IMPRESSION: No evidence of active pulmonary disease.  Air-fluid levels in the subcutaneous tissues of the right lateral anterior chest wall may represent postoperative fluid collection or soft tissue abscess.  Original Report Authenticated By: Marlon Pel, M.D.   Ct Chest Wo Contrast  12/21/2010  *RADIOLOGY REPORT*  Clinical Data: Status post bilateral mastectomy with cellulitis and fever.  CT CHEST WITHOUT CONTRAST  Technique:  Multidetector CT imaging of the chest was performed following the standard protocol without IV contrast.  Comparison: None  Findings: There are skin staples and surgical changes from bilateral mastectomies.  Bilateral interstitial changes in the subcutaneous fat suggesting cellulitis.  There is a large postoperative fluid collection on the right side measuring 12 x 4 cm.  This could be a postoperative liquefied hematoma but cannot exclude an abscess.  The Port-A-Cath is been good position without complicating features.  The underlying bony thorax is  intact.  The heart is normal in size.  No pericardial effusion.  No mediastinal or hilar lymphadenopathy.  The esophagus is grossly normal.  Examination of the lung parenchyma demonstrates no acute pulmonary  findings.  No infiltrates, effusions or edema.  No worrisome pulmonary nodules or masses.  The upper abdomen is unremarkable.  The gallbladder is contracted.  IMPRESSION:  1.  Bilateral chest wall cellulitis. 2.  12 x 4 cm fluid collection in the subcutaneous fat on the right side could reflect a postoperative hematoma, seroma or abscess. 3.  No acute pulmonary findings.  Original Report Authenticated By: P. Loralie Champagne, M.D.   Dg Chest Port 1v Same Day  12/23/2010  *RADIOLOGY REPORT*  Clinical Data: Status post attempted central line placement.  PORTABLE CHEST - 1 VIEW SAME DAY  Comparison: Chest x-ray 11/26/2010.  Findings: Port-A-Cath has been removed.  Surgical staples are seen over the right and left chest.  There is mild left basilar atelectasis.  There is no visible pneumothorax.  IMPRESSION: No visible pneumothorax.  Mild left basilar atelectasis.  Original Report Authenticated By: Elsie Stain, M.D.    Antibiotics:  Anti-infectives     Start     Dose/Rate Route Frequency Ordered Stop   12/27/10 1800   ceFAZolin (ANCEF) IVPB 2 g/50 mL premix        2 g 100 mL/hr over 30 Minutes Intravenous Every 8 hours 12/27/10 1718     12/22/10 1700   ceFEPIme (MAXIPIME) 2 g in dextrose 5 % 50 mL IVPB  Status:  Discontinued        2 g 100 mL/hr over 30 Minutes Intravenous Every 12 hours 12/22/10 1612 12/27/10 1534   12/20/10 1500   vancomycin (VANCOCIN) IVPB 1000 mg/200 mL premix  Status:  Discontinued        1,000 mg 200 mL/hr over 60 Minutes Intravenous Every 12 hours 12/20/10 0910 12/22/10 1539   12/20/10 0300   vancomycin (VANCOCIN) IVPB 1000 mg/200 mL premix  Status:  Discontinued        1,000 mg 200 mL/hr over 60 Minutes Intravenous Every 8 hours 12/20/10 0256 12/20/10 0910    12/20/10 0150   piperacillin-tazobactam (ZOSYN) IVPB 3.375 g  Status:  Discontinued        3.375 g 12.5 mL/hr over 240 Minutes Intravenous 3 times per day 12/20/10 0150 12/22/10 1539   12/19/10 2245   piperacillin-tazobactam (ZOSYN) IVPB 3.375 g  Status:  Discontinued        3.375 g 12.5 mL/hr over 240 Minutes Intravenous 3 times per day 12/19/10 2243 12/19/10 2247          Medications: Scheduled Meds:    . ceFAZolin (ANCEF) IV  2 g Intravenous Q8H  . docusate sodium  100 mg Oral BID  . DULoxetine  60 mg Oral BID  . enoxaparin (LOVENOX) injection  1 mg/kg Subcutaneous Q12H  . fentaNYL  150 mcg Transdermal Q72H  . gabapentin  900 mg Oral TID  . hydrochlorothiazide  25 mg Oral Daily  . insulin aspart  0-5 Units Subcutaneous QHS  . insulin aspart  0-9 Units Subcutaneous TID WC  . metoCLOPramide  10 mg Oral BID  . pantoprazole  40 mg Oral Q1200  . potassium chloride SA  20 mEq Oral BID  . sodium chloride  3 mL Intravenous Q12H  . spironolactone  25 mg Oral Daily  . traZODone  150 mg Oral QHS  . DISCONTD: ceFEPIme (MAXIPIME) 2 GM IVP  2 g Intravenous Q12H   Continuous Infusions:    . sodium chloride    . 0.9 % NaCl with KCl 20 mEq / L 20 mL/hr at 12/26/10 2125   PRN Meds:.acetaminophen,  acetaminophen, alum & mag hydroxide-simeth, carisoprodol, clonazePAM, HYDROcodone-acetaminophen, HYDROmorphone (DILAUDID) injection, magnesium hydroxide, ondansetron (ZOFRAN) IV, ondansetron, promethazine, promethazine, sodium chloride, DISCONTD: magnesium hydroxide  Assessment/Plan: ANTIA RAHAL is a 55 y.o. 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 She was readmitted with fevers on antibiotics. 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 I think the Pantoea agglomerans  that grew was likely a contaminant --will change to IV ancef and treat for 6 weeks total with weekly check of BMp, cbc faxed to me at [phone] --check 2decho --I have scheduled the patient for followup with me on January 2nd at 4pm in [facility] located in [address], in [employer] building on [address]. Office phone [phone] clinic [phone] --please call with further questions   Bacteremia: --see above discussion and plan    LOS: 9 days   Acey Lav 12/28/2010, 11:53 AM

## 2010-12-28 NOTE — Progress Notes (Signed)
CSW met with patient to discuss d/c planning, pt stated that she would like to d/c home with [facility] that she feels comfortable with the IV antibiotics and she has a friend that lives with her that would also be able to help her. RNCM, Olegario Messier made aware and is checked with [facility] to see if they would be willing to take patient. As a backup CSW completed FL2 and faxed out to [facility], CSW provided patient with list of facilities to look over. Will f/u.   [name] Carr, LCSWA [phone]

## 2010-12-28 NOTE — Discharge Summary (Signed)
Physician Discharge Summary  Patient ID: [name] Carr MRN: [id number] DOB/AGE: 09-Oct-1955 55 y.o.  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 Qty: 90 each, Refills: 0    enoxaparin (LOVENOX) 150 MG/ML injection Inject 0.87 mLs (130 mg total) into the skin daily. Qty: 30 mL, Refills: 3      CONTINUE these medications which have CHANGED   Details  HYDROmorphone (DILAUDID) 2 MG tablet Take 2 tablets (4 mg total) by mouth every 4 (four) hours as needed. Qty: 60 tablet, Refills: 0      CONTINUE these medications which have NOT CHANGED   Details  carisoprodol (SOMA) 350 MG tablet TAKE 1 TABLET BY MOUTH FOUR TIMES DAILY AS NEEDED Qty: 120 tablet, Refills: 0    clobetasol (TEMOVATE) 0.05 % ointment Apply topically 2 (two) times daily. Qty: 30 g, Refills: 0   Associated Diagnoses: Psoriasis    clonazePAM (KLONOPIN) 1 MG tablet Take 1 tablet (1 mg total) by mouth 2 (two) times daily as needed. Qty: 60 tablet, Refills: 6    docusate sodium (COLACE) 100 MG capsule Take 100 mg by mouth 2 (two) times daily.      DULoxetine (CYMBALTA) 60 MG capsule Take 1 capsule (60 mg total) by mouth 2 (two) times daily. Qty: 60 capsule, Refills: 6   Associated Diagnoses: Chronic pain syndrome    EPINEPHrine (EPIPEN) 0.3 mg/0.3 mL DEVI Inject 0.3 mLs (0.3 mg total) into the muscle once. Qty: 1 Device, Refills: 1    fentaNYL (DURAGESIC - DOSED MCG/HR) 75 MCG/HR Place 2 patches onto the skin every 3 (three) days.     furosemide  (LASIX) 20 MG tablet Take 1 tablet (20 mg total) by mouth daily. Qty: 30 tablet, Refills: 3   Associated Diagnoses: Edema    gabapentin (NEURONTIN) 300 MG capsule Take 3 capsules (900 mg total) by mouth 3 (three) times daily. Qty: 270 capsule, Refills: 3    hydrochlorothiazide (HYDRODIURIL) 25 MG tablet TAKE 1 TABLET BY MOUTH EVERY DAY Qty: 30 tablet, Refills: 5    metoCLOPramide (REGLAN) 5 MG tablet Take 2 tablets (10 mg total) by mouth 2 (two) times daily. Qty: 60 tablet, Refills: 1   Associated Diagnoses: Abdominal pain, unspecified site; Nausea with vomiting    omeprazole (PRILOSEC) 40 MG capsule Take 40 mg by mouth every morning.      potassium chloride SA (K-DUR,KLOR-CON) 20 MEQ tablet Take 1 tablet (20 mEq total) by mouth 2 (two) times daily. Or as directed Qty: 60 tablet, Refills: 3   Associated Diagnoses: Hypopotassemia    Probiotic Product (PROBIOTIC FORMULA) CAPS Take by mouth daily.      spironolactone-hydrochlorothiazide (ALDACTAZIDE) 25-25 MG per tablet Take 1 tablet by mouth 2 (two) times daily. Qty: 60 tablet, Refills: 5    traZODone (DESYREL) 150 MG tablet Take 150 mg by mouth at bedtime.        STOP taking these medications     predniSONE (DELTASONE) 10 MG tablet          Disposition and Follow-up: The patient will followup with her  primary care physician , surgeon, pain care specialist, Oncologist, and infectious disease specialist as specified in the discharge followup section.  Consults: Dr. Paulette Blanch Dam of infectious diseases; Dr. Glenna Fellows of general surgery; Dr. Deanne Coffer of interventional radiology.   Significant Diagnostic Studies:  Ct Chest Wo Contrast  12/21/2010  *RADIOLOGY REPORT*  Clinical Data: Status post bilateral mastectomy with cellulitis and fever.  CT CHEST WITHOUT CONTRAST  Technique:  Multidetector CT imaging of the chest was performed following the standard protocol without IV contrast.  Comparison: None  Findings: There  are skin staples and surgical changes from bilateral mastectomies.  Bilateral interstitial changes in the subcutaneous fat suggesting cellulitis.  There is a large postoperative fluid collection on the right side measuring 12 x 4 cm.  This could be a postoperative liquefied hematoma but cannot exclude an abscess.  The Port-A-Cath is been good position without complicating features.  The underlying bony thorax is intact.  The heart is normal in size.  No pericardial effusion.  No mediastinal or hilar lymphadenopathy.  The esophagus is grossly normal.  Examination of the lung parenchyma demonstrates no acute pulmonary findings.  No infiltrates, effusions or edema.  No worrisome pulmonary nodules or masses.  The upper abdomen is unremarkable.  The gallbladder is contracted.  IMPRESSION:  1.  Bilateral chest wall cellulitis. 2.  12 x 4 cm fluid collection in the subcutaneous fat on the right side could reflect a postoperative hematoma, seroma or abscess. 3.  No acute pulmonary findings.  Original Report Authenticated By: P. Loralie Champagne, M.D.   Ir Fluoro Guide Cv Line Right  12/26/2010  *RADIOLOGY REPORT*  Clinical data: Breast cancer.  Wound infection, needs access for IV antibiotics. 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  RIGHT IJ TUNNELED CENTRAL VENOUS CATHETER PLACEMENT UNDER ULTRASOUND AND FLUOROSCOPIC GUIDANCE:  Technique and findings: The patient was premedicated for examination.  The procedure, risks (including but not limited to bleeding, infection, organ damage), benefits, and alternatives were explained to the patient.  Questions regarding the procedure were encouraged and answered.  The patient understands and consents to the procedure. Patency of the right IJ vein was confirmed with ultrasound with image documentation. Noncompressible thrombus was noted in the left IJ vein.  An appropriate skin site was determined. Skin site was marked. Region was prepped using  maximum barrier technique including cap and mask, sterile gown, sterile gloves, large sterile sheet, and Chlorhexidine   as cutaneous antisepsis.  The region was infiltrated locally with 1% lidocaine. Under real-time ultrasound guidance, the right IJ vein was accessed with a 21 gauge micropuncture needle; the needle tip within the vein was confirmed with ultrasound image documentation. A 5-French cuffed power injectable dual lumen catheter was tunneled from a right anterior chest wall approach to the IJ dermatotomy site.  The needle exchanged over a guidewire for peel-away sheath through which an the 5 [place] power injectable dual lumen catheter was advanced. This was positioned with the tip at the cavoatrial junction. Spot chest radiograph shows good positioning and no pneumothorax. Catheter was flushed and sutured externally with 0-Prolene sutures. IJ dermatotomy closed with Dermabond.  Patient tolerated the procedure well, with no immediate complication.  IMPRESSION: 1. Technically successful right IJ tunneled double lumen power injectable central venous catheter placement.  Original Report Authenticated By: Osa Craver, M.D.   Ir US Guide Vasc Access Right  12/26/2010  *RADIOLOGY REPORT*  Clinical data: Breast cancer.  Wound infection, needs access for  IV antibiotics. 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  RIGHT IJ TUNNELED CENTRAL VENOUS CATHETER PLACEMENT UNDER ULTRASOUND AND FLUOROSCOPIC GUIDANCE:  Technique and findings: The patient was premedicated for examination.  The procedure, risks (including but not limited to bleeding, infection, organ damage), benefits, and alternatives were explained to the patient.  Questions regarding the procedure were encouraged and answered.  The patient understands and consents to the procedure. Patency of the right IJ vein was confirmed with ultrasound with image documentation. Noncompressible thrombus was noted in the left IJ  vein.  An appropriate skin site was determined. Skin site was marked. Region was prepped using maximum barrier technique including cap and mask, sterile gown, sterile gloves, large sterile sheet, and Chlorhexidine   as cutaneous antisepsis.  The region was infiltrated locally with 1% lidocaine. Under real-time ultrasound guidance, the right IJ vein was accessed with a 21 gauge micropuncture needle; the needle tip within the vein was confirmed with ultrasound image documentation. A 5-French cuffed power injectable dual lumen catheter was tunneled from a right anterior chest wall approach to the IJ dermatotomy site.  The needle exchanged over a guidewire for peel-away sheath through which an the 5 [place] power injectable dual lumen catheter was advanced. This was positioned with the tip at the cavoatrial junction. Spot chest radiograph shows good positioning and no pneumothorax. Catheter was flushed and sutured externally with 0-Prolene sutures. IJ dermatotomy closed with Dermabond.  Patient tolerated the procedure well, with no immediate complication.  IMPRESSION: 1. Technically successful right IJ tunneled double lumen power injectable central venous catheter placement.  Original Report Authenticated By: Osa Craver, M.D.   Dg Chest Port 1v Same Day  12/23/2010  *RADIOLOGY REPORT*  Clinical Data: Status post attempted central line placement.  PORTABLE CHEST - 1 VIEW SAME DAY  Comparison: Chest x-ray 11/26/2010.  Findings: Port-A-Cath has been removed.  Surgical staples are seen over the right and left chest.  There is mild left basilar atelectasis.  There is no visible pneumothorax.  IMPRESSION: No visible pneumothorax.  Mild left basilar atelectasis.  Original Report Authenticated By: Elsie Stain, M.D.    Discharge Laboratory Values: Results for orders placed during the hospital encounter of 12/19/10 (from the past 48 hour(s))  CBC     Status: Abnormal   Collection Time   12/27/10  5:16 AM        Component Value Range Comment   WBC 13.1 (*) 4.0 - 10.5 (K/uL)    RBC 3.35 (*) 3.87 - 5.11 (MIL/uL)    Hemoglobin 8.4 (*) 12.0 - 15.0 (g/dL)    HCT 16.1 (*) 09.6 - 46.0 (%)    MCV 83.6  78.0 - 100.0 (fL)    MCH 25.1 (*) 26.0 - 34.0 (pg)    MCHC 30.0  30.0 - 36.0 (g/dL)    RDW 04.5 (*) 40.9 - 15.5 (%)    Platelets 293  150 - 400 (K/uL)   GLUCOSE, CAPILLARY     Status: Abnormal   Collection Time   12/27/10  4:56 PM      Component Value Range Comment   Glucose-Capillary 130 (*) 70 - 99 (mg/dL)    Comment 1 Notify RN     GLUCOSE, CAPILLARY     Status: Abnormal   Collection Time   12/27/10  6:51 PM      Component Value Range Comment   Glucose-Capillary 114 (*) 70 - 99 (mg/dL)   GLUCOSE, CAPILLARY  Status: Abnormal   Collection Time   12/27/10  9:27 PM      Component Value Range Comment   Glucose-Capillary 142 (*) 70 - 99 (mg/dL)    Comment 1 Notify RN     GLUCOSE, CAPILLARY     Status: Abnormal   Collection Time   12/28/10  9:05 AM      Component Value Range Comment   Glucose-Capillary 104 (*) 70 - 99 (mg/dL)   GLUCOSE, CAPILLARY     Status: Abnormal   Collection Time   12/28/10 12:09 PM      Component Value Range Comment   Glucose-Capillary 144 (*) 70 - 99 (mg/dL)     Brief H and P: For complete details please refer to admission H and P, but in brief, [name] Carr is an unfortunate 55 year old female, who has had breast cancer since 2011. She's undergone incomplete chemotherapy and more recently bilateral mastectomies on November 19, 2010. She subsequently developed a staph infection and sepsis from her surgical wound site. She was discharged home in IV antibiotic - vancomycin. On the day prior to admission,  she noted she could not think straight, was confused and disoriented. She checked her temperature it was 103.8. She was brought to the hospital for evaluation and ultimately referred for admission due to her sepsis coming in in a compromised status, and likely  cellulitis/Port-A-Cath infection   Physical Exam at Discharge: BP 148/75  Pulse 79  Temp(Src) 98.4 F (36.9 C) (Oral)  Resp 16  Ht 5' 5.5" (1.664 m)  Wt 88.905 kg (196 lb)  BMI 32.12 kg/m2  SpO2 97% Gen: No acute distress.  Cardiovascular: Regular rate, and rhythm. No murmurs, rubs, or gallops.  Respiratory: Lungs clear to auscultation bilaterally.  Gastrointestinal: Abdomen soft, nontender, nondistended with normal active bowel sounds.  Extremities: 1+ edema to the bilateral hands and feet.   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 She was seen by Dr. Johna Sheriff on 12/20/2010 to evaluate her Port-A-Cath as she had culture confirmed coagulase-negative Staphylococcus in her blood, the site felt to be from an infected Port-A-Cath. She failed outpatient IV antibiotic therapy with vancomycin to salvage the port. A CT scan of her chestwas done on 12/21/2010 which showed bilateral chest wall cellulitis and a 12x4 centimeter fluid collection in the subcutaneous fat on the right. The infectious disease physicians were subsequently consulted. The patient had her Port-A-Cath removed on 12/23/2010. Her hospital course has been complicated by difficulty obtaining IV access. Neither of her arms were considered good sources for IV access due to her history of bilateral mastectomy and risk of lymphedema. Her Port-A-Cath had been removed and she had active cellulitis to her chest wall preventing access through the chest wall. The critical care team attempted to put a central line into her right internal jugular vein but they were unable to thread the catheter and put a peripheral line in that became dislodged shortly afterwards. The patient ultimately had a right IJ tunnel catheter placed by the interventional radiologist on 12/26/2010. The plan now is to send her home on 4-6 weeks of IV therapy with Ancef per  infectious disease recommendations. She has followup scheduled with Dr. Daiva Eves on 02/09/11. The patient will be sent home with home health care nursing and she indicates that she is able to administer her IV doses of Ancef. Active Problems:  Neck pain secondary to DVT (deep  venous thrombosis) The patient complained of neck pain and a diagnostic evaluation ultimately showed that she had a left internal jugular DVT. She has been placed on therapeutic dose Lovenox. The patient indicates that she knows how to self administer Lovenox.  CARCINOMA, BREAST The patient has followup scheduled with Dr. Cyndie Chime on 01/07/2011.  PERSONALITY DISORDER The patient's mood and affect were stable. She was continued on her usual doses of Cymbalta and when necessary Klonopin.  Chronic pain syndrome The patient indicates that she receives her pain medications from the [facility]. She was very medication seeking while in the hospital and very resistant to attempts to titrate down her medicines. A followup appointment has been made at her pain clinic on 01/10/2011. She has been instructed to bring her discharge paperwork with her for confirmation of her discharge prescriptions. She has been given a prescription for 60 tablets of Dilaudid-HP, 4 mg.  Hypokalemia Patient's electrolytes were monitored closely and her potassium was replaced.  Edema extremities Improved with diuresis and resumption of her home doses of spironolactone and hydrochlorothiazide.  Normocytic anemia Likely secondary to anemia of chronic disease. No further workup was undertaken. Her hemoglobin and hematocrit were stable.  DIABETES MELLITUS, TYPE II Recommended carbohydrate modified diet at discharge. Her hemoglobin A1c is 6.1%. She was maintained on sliding scale insulin while in hospital.   Time spent on Discharge: 1 hour.  Signed: Dr. Trula Ore Jlynn Langille Pager [phone] 12/28/2010, 3:11 PM

## 2010-12-28 NOTE — Progress Notes (Signed)
ANTICOAGULATION CONSULT NOTE - Follow Up Consult  Pharmacy Consult for Lovenox Indication: DVT  Allergies  Allergen Reactions  . Versed Anaphylaxis  . Infliximab Nausea And Vomiting  . Iohexol      Code: SOB   . Ketorolac Tromethamine     REACTION: but pt states not injectable  . Methotrexate Other (See Comments)    unknown  . Nalbuphine Other (See Comments)    unknown  . Nsaids Nausea And Vomiting  . Celecoxib Rash    Patient Measurements: Height: 5' 5.5" (166.4 cm) Weight: 196 lb (88.905 kg) IBW/kg (Calculated) : 58.15  Adjusted Body Weight:   Vital Signs: Temp: 98.4 F (36.9 C) (11/20 0536) Temp src: Oral (11/20 0536) BP: 147/99 mmHg (11/20 0536) Pulse Rate: 113  (11/20 0536)  Labs:  Basename 12/27/10 0516 12/26/10 1100  HGB 8.4* 8.9*  HCT 28.0* 28.6*  PLT 293 228  APTT -- --  LABPROT -- --  INR -- --  HEPARINUNFRC -- --  CREATININE -- 0.90  CKTOTAL -- --  CKMB -- --  TROPONINI -- --   Estimated Creatinine Clearance: 77.7 ml/min (by C-G formula based on Cr of 0.9).   Medications:  Scheduled:    . ceFAZolin (ANCEF) IV  2 g Intravenous Q8H  . docusate sodium  100 mg Oral BID  . DULoxetine  60 mg Oral BID  . enoxaparin (LOVENOX) injection  130 mg Subcutaneous Q24H  . fentaNYL  150 mcg Transdermal Q72H  . gabapentin  900 mg Oral TID  . hydrochlorothiazide  25 mg Oral Daily  . insulin aspart  0-5 Units Subcutaneous QHS  . insulin aspart  0-9 Units Subcutaneous TID WC  . metoCLOPramide  10 mg Oral BID  . pantoprazole  40 mg Oral Q1200  . potassium chloride SA  20 mEq Oral BID  . sodium chloride  3 mL Intravenous Q12H  . spironolactone  25 mg Oral Daily  . traZODone  150 mg Oral QHS  . DISCONTD: ceFEPIme (MAXIPIME) 2 GM IVP  2 g Intravenous Q12H  . DISCONTD: enoxaparin (LOVENOX) injection  1 mg/kg Subcutaneous Q12H   Infusions:    . sodium chloride    . 0.9 % NaCl with KCl 20 mEq / L 20 mL/hr at 12/26/10 2125   PRN: acetaminophen,  acetaminophen, alum & mag hydroxide-simeth, carisoprodol, clonazePAM, HYDROcodone-acetaminophen, HYDROmorphone (DILAUDID) injection, magnesium hydroxide, ondansetron (ZOFRAN) IV, ondansetron, promethazine, promethazine, sodium chloride, DISCONTD: magnesium hydroxide Anti-infectives     Start     Dose/Rate Route Frequency Ordered Stop   12/27/10 1800   ceFAZolin (ANCEF) IVPB 2 g/50 mL premix        2 g 100 mL/hr over 30 Minutes Intravenous Every 8 hours 12/27/10 1718     12/22/10 1700   ceFEPIme (MAXIPIME) 2 g in dextrose 5 % 50 mL IVPB  Status:  Discontinued        2 g 100 mL/hr over 30 Minutes Intravenous Every 12 hours 12/22/10 1612 12/27/10 1534   12/20/10 1500   vancomycin (VANCOCIN) IVPB 1000 mg/200 mL premix  Status:  Discontinued        1,000 mg 200 mL/hr over 60 Minutes Intravenous Every 12 hours 12/20/10 0910 12/22/10 1539   12/20/10 0300   vancomycin (VANCOCIN) IVPB 1000 mg/200 mL premix  Status:  Discontinued        1,000 mg 200 mL/hr over 60 Minutes Intravenous Every 8 hours 12/20/10 0256 12/20/10 0910   12/20/10 0150   piperacillin-tazobactam (ZOSYN) IVPB 3.375  g  Status:  Discontinued        3.375 g 12.5 mL/hr over 240 Minutes Intravenous 3 times per day 12/20/10 0150 12/22/10 1539   12/19/10 2245   piperacillin-tazobactam (ZOSYN) IVPB 3.375 g  Status:  Discontinued        3.375 g 12.5 mL/hr over 240 Minutes Intravenous 3 times per day 12/19/10 2243 12/19/10 2247          Assessment: Day 4 Lovenox for L IJ VTE, associated with PAC  Goal of Therapy:  Lovenox: will change to 1.5 mg/kg/ q24 hr, pt to follow-up at [facility] for VTE treatment.    Plan:  Lovenox dose to 130 mg q24hr. Follow CBC,  Caree Wolpert L 12/28/2010,12:42 PM

## 2010-12-29 DIAGNOSIS — R040 Epistaxis: Secondary | ICD-10-CM | POA: Diagnosis not present

## 2010-12-29 LAB — GLUCOSE, CAPILLARY
Glucose-Capillary: 125 mg/dL — ABNORMAL HIGH (ref 70–99)
Glucose-Capillary: 100 mg/dL — ABNORMAL HIGH (ref 70–99)

## 2010-12-29 LAB — CBC
MCH: 25.1 pg — ABNORMAL LOW (ref 26.0–34.0)
MCV: 82.9 fL (ref 78.0–100.0)
Platelets: 340 10*3/uL (ref 150–400)
RDW: 16 % — ABNORMAL HIGH (ref 11.5–15.5)
WBC: 8.4 10*3/uL (ref 4.0–10.5)

## 2010-12-29 LAB — APTT: aPTT: 32 seconds (ref 24–37)

## 2010-12-29 LAB — BASIC METABOLIC PANEL
Calcium: 9.3 mg/dL (ref 8.4–10.5)
GFR calc Af Amer: 80 mL/min — ABNORMAL LOW (ref >90)
GFR calc non Af Amer: 69 mL/min — ABNORMAL LOW (ref >90)
Potassium: 4.2 mEq/L (ref 3.5–5.1)
Sodium: 139 mEq/L (ref 135–145)

## 2010-12-29 LAB — PROTIME-INR
INR: 1.06 (ref 0.00–1.49)
Prothrombin Time: 14 seconds (ref 11.6–15.2)

## 2010-12-29 LAB — HEPARIN ANTI-XA: Heparin LMW: 0.8 IU/mL

## 2010-12-29 LAB — DIFFERENTIAL
Basophils Absolute: 0 10*3/uL (ref 0.0–0.1)
Eosinophils Absolute: 0.3 10*3/uL (ref 0.0–0.7)
Eosinophils Relative: 3 % (ref 0–5)
Lymphocytes Relative: 27 % (ref 12–46)
Neutrophils Relative %: 60 % (ref 43–77)

## 2010-12-29 NOTE — Progress Notes (Signed)
ANTICOAGULATION CONSULT NOTE - Follow Up Consult  Pharmacy Consult for Lovenox Indication: DVT  Allergies  Allergen Reactions  . Versed Anaphylaxis  . Infliximab Nausea And Vomiting  . Iohexol      Code: SOB   . Ketorolac Tromethamine     REACTION: but pt states not injectable  . Methotrexate Other (See Comments)    unknown  . Nalbuphine Other (See Comments)    unknown  . Nsaids Nausea And Vomiting  . Celecoxib Rash    Patient Measurements: Height: 5' 5.5" (166.4 cm) Weight: 196 lb (88.905 kg) IBW/kg (Calculated) : 58.15    Vital Signs: Temp: 97.7 F (36.5 C) (11/21 0507) BP: 165/81 mmHg (11/21 0507) Pulse Rate: 98  (11/21 0507)  Labs:  Basename 12/29/10 0925 12/27/10 0516  HGB 8.8* 8.4*  HCT 29.1* 28.0*  PLT 340 293  APTT 32 --  LABPROT 14.0 --  INR 1.06 --  HEPARINUNFRC -- --  CREATININE 0.91 --  CKTOTAL -- --  CKMB -- --  TROPONINI -- --   Estimated Creatinine Clearance: 76.8 ml/min (by C-G formula based on Cr of 0.91).   Medications:  Scheduled:     . ceFAZolin (ANCEF) IV  2 g Intravenous Q8H  . docusate sodium  100 mg Oral BID  . DULoxetine  60 mg Oral BID  . enoxaparin (LOVENOX) injection  130 mg Subcutaneous Q24H  . fentaNYL  150 mcg Transdermal Q72H  . gabapentin  900 mg Oral TID  . hydrochlorothiazide  25 mg Oral Daily  . insulin aspart  0-5 Units Subcutaneous QHS  . insulin aspart  0-9 Units Subcutaneous TID WC  . metoCLOPramide  10 mg Oral BID  . pantoprazole  40 mg Oral Q1200  . potassium chloride SA  20 mEq Oral BID  . sodium chloride  3 mL Intravenous Q12H  . spironolactone  25 mg Oral Daily  . traZODone  150 mg Oral QHS  . DISCONTD: enoxaparin (LOVENOX) injection  1 mg/kg Subcutaneous Q12H   Infusions:     . sodium chloride    . 0.9 % NaCl with KCl 20 mEq / L 20 mL/hr at 12/28/10 1832   PRN: acetaminophen, acetaminophen, alum & mag hydroxide-simeth, carisoprodol, clonazePAM, HYDROcodone-acetaminophen, HYDROmorphone  (DILAUDID) injection, magnesium hydroxide, ondansetron (ZOFRAN) IV, ondansetron, promethazine, promethazine, sodium chloride Anti-infectives     Start     Dose/Rate Route Frequency Ordered Stop   12/28/10 0000   ceFAZolin (ANCEF) 2-3 GM-% SOLR        2 g 100 mL/hr over 30 Minutes Intravenous Every 8 hours 12/28/10 1506     12/27/10 1800   ceFAZolin (ANCEF) IVPB 2 g/50 mL premix        2 g 100 mL/hr over 30 Minutes Intravenous Every 8 hours 12/27/10 1718     12/22/10 1700   ceFEPIme (MAXIPIME) 2 g in dextrose 5 % 50 mL IVPB  Status:  Discontinued        2 g 100 mL/hr over 30 Minutes Intravenous Every 12 hours 12/22/10 1612 12/27/10 1534   12/20/10 1500   vancomycin (VANCOCIN) IVPB 1000 mg/200 mL premix  Status:  Discontinued        1,000 mg 200 mL/hr over 60 Minutes Intravenous Every 12 hours 12/20/10 0910 12/22/10 1539   12/20/10 0300   vancomycin (VANCOCIN) IVPB 1000 mg/200 mL premix  Status:  Discontinued        1,000 mg 200 mL/hr over 60 Minutes Intravenous Every 8 hours 12/20/10 0256 12/20/10  4098   12/20/10 0150   piperacillin-tazobactam (ZOSYN) IVPB 3.375 g  Status:  Discontinued        3.375 g 12.5 mL/hr over 240 Minutes Intravenous 3 times per day 12/20/10 0150 12/22/10 1539   12/19/10 2245   piperacillin-tazobactam (ZOSYN) IVPB 3.375 g  Status:  Discontinued        3.375 g 12.5 mL/hr over 240 Minutes Intravenous 3 times per day 12/19/10 2243 12/19/10 2247          Assessment: Day 5 Lovenox for L IJ VTE, associated with PAC. Changed to 1.5mg /kg SQ q24h dosing yesterday. Epistaxis overnight. Patient picking may be contributing. Right side packed by ENT this am. PTT, PT/INR all wnl. CBC stable. At 0.8units/l, the heparin level is a little higher than expected drawn 17.5hrs after last dose but not significantly.   Goal of Therapy:  Heparin level 1-2units/l drawn 4hrs after dose.   Plan:  Continue Lovenox 1.5mg /kg/day: 130 mg q24hr. Follow-up daily.  Reece Packer 12/29/2010,11:03 AM

## 2010-12-29 NOTE — Progress Notes (Signed)
Pharmacy will sign-off on Ancef dosing since renal function is stable.   Charolotte Eke, PharmD, pager [phone]. 12/29/2010, ,8:48 AM.

## 2010-12-29 NOTE — Progress Notes (Signed)
Subjective: Received a call from nursing the patient having epistaxis.  Interval history: Patient reports epistaxis since approximately 3:30 PM yesterday afternoon. She states that the epistaxis has been continuous mostly throughout the night. She's having blood clots via the mouth as a result of drainage from the nostrils. At the bedside the patient is safe some of the clot formation for me to see. The nurse reports that the patient has been digging into the nostrils causing trauma to the nasal mucosa. Please note that patient is on Lovenox at therapeutic dose aiming for an internal jugular vein DVT. Otherwise the patient has no other complaints. She denies any dizziness, or any feeling of generalized weakness. Objective: Filed Vitals:   12/28/10 0536 12/28/10 1326 12/28/10 2054 12/29/10 0507  BP: 147/99 148/75 168/73 165/81  Pulse: 113 79 87 98  Temp: 98.4 F (36.9 C) 98.4 F (36.9 C) 97.7 F (36.5 C) 97.7 F (36.5 C)  TempSrc: Oral Oral Oral   Resp: 20 16 16 18   Height:      Weight:      SpO2: 98% 97% 94% 91%   Weight change:   Intake/Output Summary (Last 24 hours) at 12/29/10 0840 Last data filed at 12/29/10 0500  Gross per 24 hour  Intake   1970 ml  Output   1150 ml  Net    820 ml    General: Alert, awake, oriented x3, in no acute distress.  HEENT: /AT PEERL, EOMI, patient has active bleeding from both nares. Examination of the nares reveals bright red blood.  Neck: Trachea midline,  no masses, no thyromegal,y no JVD, no carotid bruit OROPHARYNX:  Moist, No exudate/ erythema/lesions. No active bleeding in the oropharynx Heart: Regular rate and rhythm, without murmurs, rubs, gallops, PMI non-displaced, no heaves or thrills on palpation.  Lungs: Clear to auscultation, no wheezing or rhonchi noted. No increased vocal fremitus resonant to percussion  Abdomen: Soft, nontender, nondistended, positive bowel sounds, no masses no hepatosplenomegaly noted..  Neuro: No focal  neurological deficits noted cranial nerves II through XII grossly intact. DTRs 2+ bilaterally upper and lower extremities. Strength 4/5 in bilateral upper and lower extremities. Musculoskeletal: No warm swelling or erythema around joints, no spinal tenderness noted. Psychiatric: Patient alert and oriented x3, good insight and cognition, good recent to remote recall. Lymph node survey: No cervical axillary or inguinal lymphadenopathy noted.     Lab Results:  Basename 12/26/10 1100  NA 137  K 3.6  CL 100  CO2 30  GLUCOSE 209*  BUN 10  CREATININE 0.90  CALCIUM 9.0  MG --  PHOS --   No results found for this basename: AST:2,ALT:2,ALKPHOS:2,BILITOT:2,PROT:2,ALBUMIN:2 in the last 72 hours No results found for this basename: LIPASE:2,AMYLASE:2 in the last 72 hours  Basename 12/27/10 0516 12/26/10 1100  WBC 13.1* 8.0  NEUTROABS -- --  HGB 8.4* 8.9*  HCT 28.0* 28.6*  MCV 83.6 81.9  PLT 293 228   No results found for this basename: CKTOTAL:3,CKMB:3,CKMBINDEX:3,TROPONINI:3 in the last 72 hours No results found for this basename: POCBNP:3 in the last 72 hours No results found for this basename: DDIMER:2 in the last 72 hours  Basename 12/26/10 1100  HGBA1C 6.1*   No results found for this basename: CHOL:2,HDL:2,LDLCALC:2,TRIG:2,CHOLHDL:2,LDLDIRECT:2 in the last 72 hours No results found for this basename: TSH,T4TOTAL,FREET3,T3FREE,THYROIDAB in the last 72 hours No results found for this basename: VITAMINB12:2,FOLATE:2,FERRITIN:2,TIBC:2,IRON:2,RETICCTPCT:2 in the last 72 hours  Micro Results: Recent Results (from the past 240 hour(s))  CULTURE, BLOOD (SINGLE)  Status: Normal   Collection Time   12/20/10 12:10 AM      Component Value Range Status Comment   Specimen Description BLOOD LEFT CHEST PORT   Final    Special Requests BOTTLES DRAWN AEROBIC AND ANAEROBIC 5CC   Final    Setup Time [id number]   Final    Culture NO GROWTH 5 DAYS   Final    Report Status 12/26/2010  FINAL   Final   CULTURE, BLOOD (SINGLE)     Status: Normal   Collection Time   12/20/10 12:20 AM      Component Value Range Status Comment   Specimen Description BLOOD R HAND   Final    Special Requests BOTTLES DRAWN AEROBIC ONLY 5CC   Final    Setup Time [id number]   Final    Culture NO GROWTH 5 DAYS   Final    Report Status 12/26/2010 FINAL   Final   WOUND CULTURE     Status: Normal   Collection Time   12/20/10  9:45 AM      Component Value Range Status Comment   Specimen Description BREAST   Final    Special Requests NONE   Final    Gram Stain     Final    Value: RARE WBC PRESENT, PREDOMINANTLY MONONUCLEAR     NO SQUAMOUS EPITHELIAL CELLS SEEN     NO ORGANISMS SEEN   Culture NO GROWTH 2 DAYS   Final    Report Status 12/22/2010 FINAL   Final   MRSA PCR SCREENING     Status: Normal   Collection Time   12/20/10  9:45 AM      Component Value Range Status Comment   MRSA by PCR NEGATIVE  NEGATIVE  Final   WOUND CULTURE (INCLUDES ANAEROBIC)     Status: Normal   Collection Time   12/23/10  2:05 PM      Component Value Range Status Comment   Specimen Description WOUND LEFT CHEST PORT A CATH POCKET   Final    Special Requests PATIENT ON FOLLOWING ZOSYN   Final    Gram Stain     Final    Value: RARE WBC PRESENT,BOTH PMN AND MONONUCLEAR     NO SQUAMOUS EPITHELIAL CELLS SEEN     NO ORGANISMS SEEN   Culture NO GROWTH 2 DAYS   Final    Report Status 12/26/2010 FINAL   Final   ANAEROBIC CULTURE     Status: Normal   Collection Time   12/23/10  2:05 PM      Component Value Range Status Comment   Specimen Description WOUND LEFT CHEST PORT A CATH POCKET   Final    Special Requests PATIENT ON FOLLOWING ZOSYN   Final    Gram Stain     Final    Value: RARE WBC PRESENT,BOTH PMN AND MONONUCLEAR     NO SQUAMOUS EPITHELIAL CELLS SEEN     NO ORGANISMS SEEN   Culture NO ANAEROBES ISOLATED   Final    Report Status 12/28/2010 FINAL   Final   CATH TIP CULTURE     Status: Normal   Collection  Time   12/23/10  2:05 PM      Component Value Range Status Comment   Specimen Description CATH TIP LEFT CHEST PORT A CATH   Final    Special Requests PATIENT ON FOLLOWING ZOSYN   Final    Culture NO GROWTH 2 DAYS   Final  Report Status 12/26/2010 FINAL   Final     Studies/Results: Ct Chest Wo Contrast  12/21/2010  *RADIOLOGY REPORT*  Clinical Data: Status post bilateral mastectomy with cellulitis and fever.  CT CHEST WITHOUT CONTRAST  Technique:  Multidetector CT imaging of the chest was performed following the standard protocol without IV contrast.  Comparison: None  Findings: There are skin staples and surgical changes from bilateral mastectomies.  Bilateral interstitial changes in the subcutaneous fat suggesting cellulitis.  There is a large postoperative fluid collection on the right side measuring 12 x 4 cm.  This could be a postoperative liquefied hematoma but cannot exclude an abscess.  The Port-A-Cath is been good position without complicating features.  The underlying bony thorax is intact.  The heart is normal in size.  No pericardial effusion.  No mediastinal or hilar lymphadenopathy.  The esophagus is grossly normal.  Examination of the lung parenchyma demonstrates no acute pulmonary findings.  No infiltrates, effusions or edema.  No worrisome pulmonary nodules or masses.  The upper abdomen is unremarkable.  The gallbladder is contracted.  IMPRESSION:  1.  Bilateral chest wall cellulitis. 2.  12 x 4 cm fluid collection in the subcutaneous fat on the right side could reflect a postoperative hematoma, seroma or abscess. 3.  No acute pulmonary findings.  Original Report Authenticated By: P. Loralie Champagne, M.D.   Ir Fluoro Guide Cv Line Right  12/26/2010  *RADIOLOGY REPORT*  Clinical data: Breast cancer.  Wound infection, needs access for IV antibiotics. 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  RIGHT IJ TUNNELED CENTRAL VENOUS CATHETER PLACEMENT UNDER  ULTRASOUND AND FLUOROSCOPIC GUIDANCE:  Technique and findings: The patient was premedicated for examination.  The procedure, risks (including but not limited to bleeding, infection, organ damage), benefits, and alternatives were explained to the patient.  Questions regarding the procedure were encouraged and answered.  The patient understands and consents to the procedure. Patency of the right IJ vein was confirmed with ultrasound with image documentation. Noncompressible thrombus was noted in the left IJ vein.  An appropriate skin site was determined. Skin site was marked. Region was prepped using maximum barrier technique including cap and mask, sterile gown, sterile gloves, large sterile sheet, and Chlorhexidine   as cutaneous antisepsis.  The region was infiltrated locally with 1% lidocaine. Under real-time ultrasound guidance, the right IJ vein was accessed with a 21 gauge micropuncture needle; the needle tip within the vein was confirmed with ultrasound image documentation. A 5-French cuffed power injectable dual lumen catheter was tunneled from a right anterior chest wall approach to the IJ dermatotomy site.  The needle exchanged over a guidewire for peel-away sheath through which an the 5 [place] power injectable dual lumen catheter was advanced. This was positioned with the tip at the cavoatrial junction. Spot chest radiograph shows good positioning and no pneumothorax. Catheter was flushed and sutured externally with 0-Prolene sutures. IJ dermatotomy closed with Dermabond.  Patient tolerated the procedure well, with no immediate complication.  IMPRESSION: 1. Technically successful right IJ tunneled double lumen power injectable central venous catheter placement.  Original Report Authenticated By: Osa Craver, M.D.   Ir US Guide Vasc Access Right  12/26/2010  *RADIOLOGY REPORT*  Clinical data: Breast cancer.  Wound infection, needs access for IV antibiotics. History of difficult venous access  and central venous occlusive disease.  RIGHT IJ TUNNELED CENTRAL VENOUS CATHETER PLACEMENT UNDER ULTRASOUND AND FLUOROSCOPIC GUIDANCE:  Technique and findings: The patient was premedicated for  examination.  The procedure, risks (including but not limited to bleeding, infection, organ damage), benefits, and alternatives were explained to the patient.  Questions regarding the procedure were encouraged and answered.  The patient understands and consents to the procedure. Patency of the right IJ vein was confirmed with ultrasound with image documentation. Noncompressible thrombus was noted in the left IJ vein.  An appropriate skin site was determined. Skin site was marked. Region was prepped using maximum barrier technique including cap and mask, sterile gown, sterile gloves, large sterile sheet, and Chlorhexidine   as cutaneous antisepsis.  The region was infiltrated locally with 1% lidocaine. Under real-time ultrasound guidance, the right IJ vein was accessed with a 21 gauge micropuncture needle; the needle tip within the vein was confirmed with ultrasound image documentation. A 5-French cuffed power injectable dual lumen catheter was tunneled from a right anterior chest wall approach to the IJ dermatotomy site.  The needle exchanged over a guidewire for peel-away sheath through which an the 5 [place] power injectable dual lumen catheter was advanced. This was positioned with the tip at the cavoatrial junction. Spot chest radiograph shows good positioning and no pneumothorax. Catheter was flushed and sutured externally with 0-Prolene sutures. IJ dermatotomy closed with Dermabond.  Patient tolerated the procedure well, with no immediate complication.  IMPRESSION: 1. Technically successful right IJ tunneled double lumen power injectable central venous catheter placement.  Original Report Authenticated By: Osa Craver, M.D.   Dg Chest Port 1v Same Day  12/23/2010  *RADIOLOGY REPORT*  Clinical Data: Status  post attempted central line placement.  PORTABLE CHEST - 1 VIEW SAME DAY  Comparison: Chest x-ray 11/26/2010.  Findings: Port-A-Cath has been removed.  Surgical staples are seen over the right and left chest.  There is mild left basilar atelectasis.  There is no visible pneumothorax.  IMPRESSION: No visible pneumothorax.  Mild left basilar atelectasis.  Original Report Authenticated By: Elsie Stain, M.D.    Medications: I have reviewed the patient's current medications. Scheduled Meds:   . ceFAZolin (ANCEF) IV  2 g Intravenous Q8H  . docusate sodium  100 mg Oral BID  . DULoxetine  60 mg Oral BID  . enoxaparin (LOVENOX) injection  130 mg Subcutaneous Q24H  . fentaNYL  150 mcg Transdermal Q72H  . gabapentin  900 mg Oral TID  . hydrochlorothiazide  25 mg Oral Daily  . insulin aspart  0-5 Units Subcutaneous QHS  . insulin aspart  0-9 Units Subcutaneous TID WC  . metoCLOPramide  10 mg Oral BID  . pantoprazole  40 mg Oral Q1200  . potassium chloride SA  20 mEq Oral BID  . sodium chloride  3 mL Intravenous Q12H  . spironolactone  25 mg Oral Daily  . traZODone  150 mg Oral QHS  . DISCONTD: enoxaparin (LOVENOX) injection  1 mg/kg Subcutaneous Q12H   Continuous Infusions:   . sodium chloride    . 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    CARCINOMA, BREAST (09/07/2009)   Assessment: The patient has a followup appointment with Dr. Lorinda Creed on 01/07/2011    DIABETES MELLITUS, TYPE  II (09/07/2009)   Assessment:  Blood sugars are well controlled     PERSONALITY DISORDER (09/01/2009)   Assessment: Patient's mood and affect are stable. She expresses disappointment at not being discharged today but understands that it is unsafe for her to be discharged while still having an active nosebleed.    Chronic pain syndrome (09/07/2009)   Assessment: Continue as per pain management Center     Epistaxis (12/29/2010)   Assessment: The patient is having nosebleeds since yesterday. She is on Lovenox for treatment of the DVT.    Plan: I have consulted Dr. Suszanne Conners for ENT to evaluate the patient for nose pack. I will also check a Lovenox level and check a level in her hemoglobin as well as her renal function. We'll also check a PT PTT and the patient. I will make further decisions regarding the Lovenox administration based upon the results of the testing.   DVT (deep venous thrombosis) (12/26/2010)   Assessment: The patient has a left internal jugular vein DVT. She has been placed on therapeutic Lovenox for this. However, if the patient continues to have epistaxis it is quite conceivable that the Lovenox may have to be discontinued.    Normocytic anemia (12/27/2010)   Assessment: Likely secondary to anemia of chronic disease. However in light of current epistaxis we'll check a hemoglobin to ensure stability.     LOS: 10 days

## 2010-12-29 NOTE — Progress Notes (Signed)
Pt has had an intermittent nosebleed through the night. Pt observed throughout night digging in nose with fingers and with kleenex and dampened cloths. Pt instructed that digging would keep her nose bleeding. When pt fell asleep during the night, no bleeding occurred. Pt denies digging while continuing to dig in front of nurse. Pt given ice for nose and clonazepam for anxiety. Sterile water added to oxygen through nasal cannula to relieve c/o of dryness in the nose that preceded the nosebleed.

## 2010-12-29 NOTE — Consult Note (Signed)
Reason for Consult: Left epistaxis Referring Physician: Dr. Marthann Schiller  HPI: [name] Carr is an 55 y.o. female who was admitted for treatment of her infected central venous catheter.  The patient has a left internal jugular vein DVT. She has been placed on therapeutic Lovenox. She began experiencing bilateral epistaxis since approximately 3:30 PM yesterday afternoon. She bleeds both anteriorly and posteriorly. She states that the epistaxis has been continuous mostly throughout the night.  The nurse reports that the patient has been digging into the nostrils causing trauma to the nasal mucosa.  The pt has a h/o a large septal perforation.  She has a habit of picking on her nose.  She denies the use of cocaine. She denies any previous h/o ENT or nasal surgery.  No previous h/o epistaxis.    Past Medical History  Diagnosis Date  . PERSONALITY DISORDER   . Chronic pain syndrome     chronic narcotics, dependancy hx - [facility] for pain mgmt  . MIGRAINE HEADACHE   . Lyme disease   . ALLERGIC RHINITIS   . ANEMIA-IRON DEFICIENCY   . HYPERLIPIDEMIA   . GERD   . DIABETES MELLITUS, TYPE II   . DEPRESSION   . SEIZURE DISORDER   . CARCINOMA, BREAST 08/2009 dx    R breast, reports mets to liver  . SLE (systemic lupus erythematosus)     [facility] - chronic pred  . Psoriasis   . Addison's disease   . Metastases to the liver   . Degenerative joint disease   . Migraine headache   . DDD (degenerative disc disease)     Past Surgical History  Procedure Date  . Breast surgery     breast biopsy  . Liver bopsy   . Port a cath placement   . Port-a-cath removal   . Mastectomy 11/19/10    bilateral mastectomy   . Port-a-cath removal 12/23/2010    Procedure: REMOVAL PORT-A-CATH;  Surgeon: Valarie Merino, MD;  Location: [facility];  Service: General;  Laterality: Left;    Family History  Problem Relation Age of Onset  . Lung cancer Mother   . Lung cancer Father   . Arthritis  Other   . Diabetes Other   . Hyperlipidemia Other     Social History:  reports that she has never smoked. She has never used smokeless tobacco. She reports that she does not drink alcohol or use illicit drugs.  Allergies:  Allergies  Allergen Reactions  . Versed Anaphylaxis  . Infliximab Nausea And Vomiting  . Iohexol      Code: SOB   . Ketorolac Tromethamine     REACTION: but pt states not injectable  . Methotrexate Other (See Comments)    unknown  . Nalbuphine Other (See Comments)    unknown  . Nsaids Nausea And Vomiting  . Celecoxib Rash    Medications:  I have reviewed the patient's current medications. Prior to Admission:  Prescriptions prior to admission  Medication Sig Dispense Refill  . carisoprodol (SOMA) 350 MG tablet TAKE 1 TABLET BY MOUTH FOUR TIMES DAILY AS NEEDED  120 tablet  0  . clobetasol (TEMOVATE) 0.05 % ointment Apply topically 2 (two) times daily.  30 g  0  . clonazePAM (KLONOPIN) 1 MG tablet Take 1 tablet (1 mg total) by mouth 2 (two) times daily as needed.  60 tablet  6  . docusate sodium (COLACE) 100 MG capsule Take 100 mg by mouth 2 (two) times  daily.        . DULoxetine (CYMBALTA) 60 MG capsule Take 1 capsule (60 mg total) by mouth 2 (two) times daily.  60 capsule  6  . EPINEPHrine (EPIPEN) 0.3 mg/0.3 mL DEVI Inject 0.3 mLs (0.3 mg total) into the muscle once.  1 Device  1  . fentaNYL (DURAGESIC - DOSED MCG/HR) 75 MCG/HR Place 2 patches onto the skin every 3 (three) days.       . furosemide (LASIX) 20 MG tablet Take 1 tablet (20 mg total) by mouth daily.  30 tablet  3  . gabapentin (NEURONTIN) 300 MG capsule Take 3 capsules (900 mg total) by mouth 3 (three) times daily.  270 capsule  3  . hydrochlorothiazide (HYDRODIURIL) 25 MG tablet TAKE 1 TABLET BY MOUTH EVERY DAY  30 tablet  5  . metoCLOPramide (REGLAN) 5 MG tablet Take 2 tablets (10 mg total) by mouth 2 (two) times daily.  60 tablet  1  . omeprazole (PRILOSEC) 40 MG capsule Take 40 mg by mouth  every morning.        . potassium chloride SA (K-DUR,KLOR-CON) 20 MEQ tablet Take 1 tablet (20 mEq total) by mouth 2 (two) times daily. Or as directed  60 tablet  3  . Probiotic Product (PROBIOTIC FORMULA) CAPS Take by mouth daily.        Marland Kitchen spironolactone-hydrochlorothiazide (ALDACTAZIDE) 25-25 MG per tablet Take 1 tablet by mouth 2 (two) times daily.  60 tablet  5  . traZODone (DESYREL) 150 MG tablet Take 150 mg by mouth at bedtime.        Marland Kitchen DISCONTD: HYDROmorphone (DILAUDID) 2 MG tablet Take 4 mg by mouth every 4 (four) hours as needed.       Marland Kitchen DISCONTD: predniSONE (DELTASONE) 10 MG tablet Take 1 tablet (10 mg total) by mouth daily.  30 tablet  3   Scheduled:   . ceFAZolin (ANCEF) IV  2 g Intravenous Q8H  . docusate sodium  100 mg Oral BID  . DULoxetine  60 mg Oral BID  . enoxaparin (LOVENOX) injection  130 mg Subcutaneous Q24H  . fentaNYL  150 mcg Transdermal Q72H  . gabapentin  900 mg Oral TID  . hydrochlorothiazide  25 mg Oral Daily  . insulin aspart  0-5 Units Subcutaneous QHS  . insulin aspart  0-9 Units Subcutaneous TID WC  . metoCLOPramide  10 mg Oral BID  . pantoprazole  40 mg Oral Q1200  . potassium chloride SA  20 mEq Oral BID  . sodium chloride  3 mL Intravenous Q12H  . spironolactone  25 mg Oral Daily  . traZODone  150 mg Oral QHS  . DISCONTD: enoxaparin (LOVENOX) injection  1 mg/kg Subcutaneous Q12H   Continuous:   . sodium chloride    . 0.9 % NaCl with KCl 20 mEq / L 20 mL/hr at 12/28/10 1832   ZOX:[id number], acetaminophen, alum & mag hydroxide-simeth, carisoprodol, clonazePAM, HYDROcodone-acetaminophen, HYDROmorphone (DILAUDID) injection, magnesium hydroxide, ondansetron (ZOFRAN) IV, ondansetron, promethazine, promethazine, sodium chloride  Results for orders placed during the hospital encounter of 12/19/10 (from the past 48 hour(s))  GLUCOSE, CAPILLARY     Status: Abnormal   Collection Time   12/27/10  4:56 PM      Component Value Range Comment    Glucose-Capillary 130 (*) 70 - 99 (mg/dL)    Comment 1 Notify RN     GLUCOSE, CAPILLARY     Status: Abnormal   Collection Time   12/27/10  6:51 PM  Component Value Range Comment   Glucose-Capillary 114 (*) 70 - 99 (mg/dL)   GLUCOSE, CAPILLARY     Status: Abnormal   Collection Time   12/27/10  9:27 PM      Component Value Range Comment   Glucose-Capillary 142 (*) 70 - 99 (mg/dL)    Comment 1 Notify RN     GLUCOSE, CAPILLARY     Status: Abnormal   Collection Time   12/28/10  9:05 AM      Component Value Range Comment   Glucose-Capillary 104 (*) 70 - 99 (mg/dL)   GLUCOSE, CAPILLARY     Status: Abnormal   Collection Time   12/28/10 12:09 PM      Component Value Range Comment   Glucose-Capillary 144 (*) 70 - 99 (mg/dL)   GLUCOSE, CAPILLARY     Status: Abnormal   Collection Time   12/28/10  4:54 PM      Component Value Range Comment   Glucose-Capillary 127 (*) 70 - 99 (mg/dL)    Comment 1 Notify RN     GLUCOSE, CAPILLARY     Status: Abnormal   Collection Time   12/28/10  9:18 PM      Component Value Range Comment   Glucose-Capillary 142 (*) 70 - 99 (mg/dL)    Comment 1 Notify RN     GLUCOSE, CAPILLARY     Status: Abnormal   Collection Time   12/29/10  8:22 AM      Component Value Range Comment   Glucose-Capillary 125 (*) 70 - 99 (mg/dL)    Comment 1 Documented in Chart      Comment 2 Notify RN      Review of Systems: [Positive bolded]  The patient denies anorexia, fever, weight loss,, vision loss, decreased hearing, hoarseness, chest pain, syncope, dyspnea on exertion, peripheral edema, balance deficits, hemoptysis, abdominal pain, melena, hematochezia, severe indigestion/heartburn, hematuria, incontinence, genital sores, muscle weakness, suspicious skin lesions, transient blindness, difficulty walking, depression, unusual weight change, previous abnormal bleeding, enlarged lymph nodes, or angioedema.  Blood pressure 165/81, pulse 98, temperature 97.7 F (36.5 C),  temperature source Oral, resp. rate 18, height 5' 5.5" (1.664 m), weight 88.905 kg (196 lb), SpO2 91.00%. Physical Exam: General: Alert and oriented times three, well developed and nourished HEENT: Her pupils are equal, round, reactive to light. Extraocular motion is intact. Examination of the ears shows normal auricles and external auditory canals bilaterally. Both tympanic membranes are intact. No middle ear effusion or hemotympanum is noted. Nasal examination shows active bleeding from the right nostril.  A large 1.5cm septal perforation is noted. Facial examination shows no asymmetry. Palpation of the face elicit no significant tenderness. Oral cavity examination shows no mucosal lacerations. No significant trismus is noted. Palpation of the neck reveals no lymphadenopathy or mass. The trachea is midline. The thyroid is not significantly enlarged. Cranial nerves 2-12 are all grossly in tact.  Procedure: Anterior/Posterior nasal packing for control of right epistaxis. Anesthesia: Topical xylocaine and Afrin Description: The patient is placed upright in her hospital bed.  Blood clot is suctions from the right nasal cavity. Bleeding is noted from anterior and posterior right nasal cavity.Topical xylocaine and Afrin are applied. A 10 cm Merocel packing is placed in the right nasal cavity with good hemostasis.  The patient tolerated the procedure well.  Assessment/Plan: Bilateral anterior and posterior epistaxis.  Only the right side is actively bleeding at this time.  The patient also has a large nasal septal perforation.  A  right 10cm Merocel packing is placed.  Continue Ancef for gram positive coverage.  Latishia Suitt,SUI W 12/29/2010, 9:08 AM

## 2010-12-30 LAB — DIFFERENTIAL
Eosinophils Absolute: 0.3 10*3/uL (ref 0.0–0.7)
Lymphocytes Relative: 24 % (ref 12–46)
Lymphs Abs: 2.7 10*3/uL (ref 0.7–4.0)
Neutro Abs: 7.4 10*3/uL (ref 1.7–7.7)
Neutrophils Relative %: 66 % (ref 43–77)

## 2010-12-30 LAB — CBC
MCH: 25.4 pg — ABNORMAL LOW (ref 26.0–34.0)
Platelets: 354 10*3/uL (ref 150–400)
RBC: 3.51 MIL/uL — ABNORMAL LOW (ref 3.87–5.11)
WBC: 11.2 10*3/uL — ABNORMAL HIGH (ref 4.0–10.5)

## 2010-12-30 LAB — GLUCOSE, CAPILLARY
Glucose-Capillary: 101 mg/dL — ABNORMAL HIGH (ref 70–99)
Glucose-Capillary: 119 mg/dL — ABNORMAL HIGH (ref 70–99)

## 2010-12-30 NOTE — Progress Notes (Signed)
Subjective: Patient had no active bleeding from her nostrils since the [employer] is in place yesterday. I reviewed hemoglobin shows a stable hemoglobin. Patient is being continued on her Lovenox without any further bleeding. The [employer] has not impeded her ability to eat and has not caused respiratory compromise. Noted at the [facility] examination yesterday that the patient has septal perforation Objective: Filed Vitals:   12/29/10 1450 12/29/10 2238 12/30/10 0545 12/30/10 1320  BP: 168/84 171/84 160/83 132/80  Pulse: 83 91 88 94  Temp: 98.2 F (36.8 C) 99.7 F (37.6 C) 98.6 F (37 C) 97.1 F (36.2 C)  TempSrc: Oral Oral Oral Oral  Resp: 18 16 18 18   Height:      Weight:      SpO2: 96% 98% 94% 93%   Weight change:   Intake/Output Summary (Last 24 hours) at 12/30/10 1533 Last data filed at 12/30/10 1300  Gross per 24 hour  Intake   1065 ml  Output   5102 ml  Net  -4037 ml    General: Alert, awake, oriented x3, in no acute distress.  HEENT: [place]/AT PEERL, EOMI. right nasal packing in place. Neck: Trachea midline,  no masses, no thyromegal,y no JVD, no carotid bruit OROPHARYNX:  Moist, No exudate/ erythema/lesions.  Heart: Regular rate and rhythm, without murmurs, rubs, gallops, PMI non-displaced, no heaves or thrills on palpation.  Lungs: Clear to auscultation, no wheezing or rhonchi noted. No increased vocal fremitus resonant to percussion  Abdomen: Soft, nontender, nondistended, positive bowel sounds, no masses no hepatosplenomegaly noted..  Neuro: No focal neurological deficits noted cranial nerves II through XII grossly intact. DTRs 2+ bilaterally upper and lower extremities. Strength 5 out of 5 in bilateral upper and lower extremities. Musculoskeletal: No warm swelling or erythema around joints, no spinal tenderness noted. Psychiatric: Patient alert and oriented x3, good insight and cognition, good recent to remote recall. Lymph node survey: No cervical axillary or  inguinal lymphadenopathy noted.     Lab Results:  [facility] 12/29/10 0925  NA 139  K 4.2  CL 99  CO2 32  GLUCOSE 109*  BUN 11  CREATININE 0.91  CALCIUM 9.3  MG --  PHOS --   No results found for this basename: AST:2,ALT:2,ALKPHOS:2,BILITOT:2,PROT:2,ALBUMIN:2 in the last 72 hours No results found for this basename: LIPASE:2,AMYLASE:2 in the last 72 hours  Basename 12/30/10 0638 12/29/10 0925  WBC 11.2* 8.4  NEUTROABS 7.4 5.0  HGB 8.9* 8.8*  HCT 29.3* 29.1*  MCV 83.5 82.9  PLT 354 340   No results found for this basename: CKTOTAL:3,CKMB:3,CKMBINDEX:3,TROPONINI:3 in the last 72 hours No results found for this basename: POCBNP:3 in the last 72 hours No results found for this basename: DDIMER:2 in the last 72 hours No results found for this basename: HGBA1C:2 in the last 72 hours No results found for this basename: CHOL:2,HDL:2,LDLCALC:2,TRIG:2,CHOLHDL:2,LDLDIRECT:2 in the last 72 hours No results found for this basename: TSH,T4TOTAL,FREET3,T3FREE,THYROIDAB in the last 72 hours No results found for this basename: VITAMINB12:2,FOLATE:2,FERRITIN:2,TIBC:2,IRON:2,RETICCTPCT:2 in the last 72 hours  Micro Results: Recent Results (from the past 240 hour(s))  WOUND CULTURE (INCLUDES ANAEROBIC)     Status: Normal   Collection Time   12/23/10  2:05 PM      Component Value Range Status Comment   Specimen Description WOUND LEFT CHEST PORT A CATH POCKET   Final    Special Requests PATIENT ON FOLLOWING ZOSYN   Final    Gram Stain     Final    Value:  RARE WBC PRESENT,BOTH PMN AND MONONUCLEAR     NO SQUAMOUS EPITHELIAL CELLS SEEN     NO ORGANISMS SEEN   Culture NO GROWTH 2 DAYS   Final    Report Status 12/26/2010 FINAL   Final   ANAEROBIC CULTURE     Status: Normal   Collection Time   12/23/10  2:05 PM      Component Value Range Status Comment   Specimen Description WOUND LEFT CHEST PORT A CATH POCKET   Final    Special Requests PATIENT ON FOLLOWING ZOSYN   Final    Gram Stain      Final    Value: RARE WBC PRESENT,BOTH PMN AND MONONUCLEAR     NO SQUAMOUS EPITHELIAL CELLS SEEN     NO ORGANISMS SEEN   Culture NO ANAEROBES ISOLATED   Final    Report Status 12/28/2010 FINAL   Final   CATH TIP CULTURE     Status: Normal   Collection Time   12/23/10  2:05 PM      Component Value Range Status Comment   Specimen Description CATH TIP LEFT CHEST PORT A CATH   Final    Special Requests PATIENT ON FOLLOWING ZOSYN   Final    Culture NO GROWTH 2 DAYS   Final    Report Status 12/26/2010 FINAL   Final     Studies/Results: Ct Chest Wo Contrast  12/21/2010  *RADIOLOGY REPORT*  Clinical Data: Status post bilateral mastectomy with cellulitis and fever.  CT CHEST WITHOUT CONTRAST  Technique:  Multidetector CT imaging of the chest was performed following the standard protocol without IV contrast.  Comparison: None  Findings: There are skin staples and surgical changes from bilateral mastectomies.  Bilateral interstitial changes in the subcutaneous fat suggesting cellulitis.  There is a large postoperative fluid collection on the right side measuring 12 x 4 cm.  This could be a postoperative liquefied hematoma but cannot exclude an abscess.  The Port-A-Cath is been good position without complicating features.  The underlying bony thorax is intact.  The heart is normal in size.  No pericardial effusion.  No mediastinal or hilar lymphadenopathy.  The esophagus is grossly normal.  Examination of the lung parenchyma demonstrates no acute pulmonary findings.  No infiltrates, effusions or edema.  No worrisome pulmonary nodules or masses.  The upper abdomen is unremarkable.  The gallbladder is contracted.  IMPRESSION:  1.  Bilateral chest wall cellulitis. 2.  12 x 4 cm fluid collection in the subcutaneous fat on the right side could reflect a postoperative hematoma, seroma or abscess. 3.  No acute pulmonary findings.  Original Report Authenticated By: P. Loralie Champagne, M.D.   Ir Fluoro Guide Cv Line  Right  12/26/2010  *RADIOLOGY REPORT*  Clinical data: Breast cancer.  Wound infection, needs access for IV antibiotics. 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  RIGHT IJ TUNNELED CENTRAL VENOUS CATHETER PLACEMENT UNDER ULTRASOUND AND FLUOROSCOPIC GUIDANCE:  Technique and findings: The patient was premedicated for examination.  The procedure, risks (including but not limited to bleeding, infection, organ damage), benefits, and alternatives were explained to the patient.  Questions regarding the procedure were encouraged and answered.  The patient understands and consents to the procedure. Patency of the right IJ vein was confirmed with ultrasound with image documentation. Noncompressible thrombus was noted in the left IJ vein.  An appropriate skin site was determined. Skin site was marked. Region was prepped using maximum barrier technique including cap and  mask, sterile gown, sterile gloves, large sterile sheet, and Chlorhexidine   as cutaneous antisepsis.  The region was infiltrated locally with 1% lidocaine. Under real-time ultrasound guidance, the right IJ vein was accessed with a 21 gauge micropuncture needle; the needle tip within the vein was confirmed with ultrasound image documentation. A 5-French cuffed power injectable dual lumen catheter was tunneled from a right anterior chest wall approach to the IJ dermatotomy site.  The needle exchanged over a guidewire for peel-away sheath through which an the 5 [place] power injectable dual lumen catheter was advanced. This was positioned with the tip at the cavoatrial junction. Spot chest radiograph shows good positioning and no pneumothorax. Catheter was flushed and sutured externally with 0-Prolene sutures. IJ dermatotomy closed with Dermabond.  Patient tolerated the procedure well, with no immediate complication.  IMPRESSION: 1. Technically successful right IJ tunneled double lumen power injectable central venous catheter  placement.  Original Report Authenticated By: Osa Craver, M.D.   Ir US Guide Vasc Access Right  12/26/2010  *RADIOLOGY REPORT*  Clinical data: Breast cancer.  Wound infection, needs access for IV antibiotics. 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  RIGHT IJ TUNNELED CENTRAL VENOUS CATHETER PLACEMENT UNDER ULTRASOUND AND FLUOROSCOPIC GUIDANCE:  Technique and findings: The patient was premedicated for examination.  The procedure, risks (including but not limited to bleeding, infection, organ damage), benefits, and alternatives were explained to the patient.  Questions regarding the procedure were encouraged and answered.  The patient understands and consents to the procedure. Patency of the right IJ vein was confirmed with ultrasound with image documentation. Noncompressible thrombus was noted in the left IJ vein.  An appropriate skin site was determined. Skin site was marked. Region was prepped using maximum barrier technique including cap and mask, sterile gown, sterile gloves, large sterile sheet, and Chlorhexidine   as cutaneous antisepsis.  The region was infiltrated locally with 1% lidocaine. Under real-time ultrasound guidance, the right IJ vein was accessed with a 21 gauge micropuncture needle; the needle tip within the vein was confirmed with ultrasound image documentation. A 5-French cuffed power injectable dual lumen catheter was tunneled from a right anterior chest wall approach to the IJ dermatotomy site.  The needle exchanged over a guidewire for peel-away sheath through which an the 5 [place] power injectable dual lumen catheter was advanced. This was positioned with the tip at the cavoatrial junction. Spot chest radiograph shows good positioning and no pneumothorax. Catheter was flushed and sutured externally with 0-Prolene sutures. IJ dermatotomy closed with Dermabond.  Patient tolerated the procedure well, with no immediate complication.  IMPRESSION: 1.  Technically successful right IJ tunneled double lumen power injectable central venous catheter placement.  Original Report Authenticated By: Osa Craver, M.D.   Dg Chest Port 1v Same Day  12/23/2010  *RADIOLOGY REPORT*  Clinical Data: Status post attempted central line placement.  PORTABLE CHEST - 1 VIEW SAME DAY  Comparison: Chest x-ray 11/26/2010.  Findings: Port-A-Cath has been removed.  Surgical staples are seen over the right and left chest.  There is mild left basilar atelectasis.  There is no visible pneumothorax.  IMPRESSION: No visible pneumothorax.  Mild left basilar atelectasis.  Original Report Authenticated By: Elsie Stain, M.D.    Medications: I have reviewed the patient's current medications. Scheduled Meds:   . ceFAZolin (ANCEF) IV  2 g Intravenous Q8H  . docusate sodium  100 mg Oral BID  . DULoxetine  60 mg Oral BID  .  enoxaparin (LOVENOX) injection  130 mg Subcutaneous Q24H  . fentaNYL  150 mcg Transdermal Q72H  . gabapentin  900 mg Oral TID  . hydrochlorothiazide  25 mg Oral Daily  . insulin aspart  0-5 Units Subcutaneous QHS  . insulin aspart  0-9 Units Subcutaneous TID WC  . metoCLOPramide  10 mg Oral BID  . pantoprazole  40 mg Oral Q1200  . potassium chloride SA  20 mEq Oral BID  . sodium chloride  3 mL Intravenous Q12H  . spironolactone  25 mg Oral Daily  . traZODone  150 mg Oral QHS   Continuous Infusions:   . sodium chloride    . 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     Epistaxis (12/29/2010)    Assessment: No further bleeding since nasal packing placed.    CARCINOMA, BREAST (09/07/2009)   Assessment: Noted    DIABETES MELLITUS, TYPE II (09/07/2009)   Assessment: Blood sugars adequately controlled    PERSONALITY DISORDER (09/01/2009)   Assessment: Patient has normal affect and disposition.    Chronic pain syndrome (09/07/2009)   Assessment: Noted stable     Hypokalemia (12/21/2010)   Assessment: Resolved    DVT (deep venous thrombosis) (12/26/2010)   Assessment: On Lovenox 1.5 mg per kilogram daily. Continue     Normocytic anemia (12/27/2010)   Assessment: Hemoglobin stable     If hemoglobin remains stable and no further epistaxis we'll plan for discharge tomorrow.   LOS: 11 days

## 2010-12-30 NOTE — Progress Notes (Signed)
Subjective: Pt reports no more active bleeding since the nasal packing.  Tolerating po well.  No hemoptysis.  Objective: Vital signs in last 24 hours: Temp:  [98.2 F (36.8 C)-99.7 F (37.6 C)] 98.6 F (37 C) (11/22 0545) Pulse Rate:  [83-91] 88  (11/22 0545) Resp:  [16-18] 18  (11/22 0545) BP: (160-171)/(83-84) 160/83 mmHg (11/22 0545) SpO2:  [94 %-98 %] 94 % (11/22 0545)  General appearance: alert, cooperative and no distress Head: Normocephalic, without obvious abnormality, atraumatic, sinuses nontender to percussion Eyes: conjunctivae/corneas clear. PERRL, EOM's intact. Fundi benign. Ears: normal TM's and external ear canals both ears Nose: Nares normal. Septum midline. Mucosa normal. No drainage or sinus tenderness., Right nasal packing in place.  No bleeding.  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 Throat: lips, mucosa, and tongue normal; teeth and gums normal and no bleeding is noted. Neck: no adenopathy, no carotid bruit, no JVD, supple, symmetrical, trachea midline and thyroid not enlarged, symmetric, no tenderness/mass/nodules Lymph nodes: Cervical, supraclavicular, and axillary nodes normal. Neurologic: Grossly normal   Basename 12/29/10 0925  NA 139  K 4.2  CL 99  CO2 32  GLUCOSE 109*  BUN 11  CREATININE 0.91  CALCIUM 9.3   Basename  12/29/10 0925  12/27/10 0516   HGB  8.8*  8.4*   HCT  29.1*  28.0*   PLT  340  293     Medications: I have reviewed the patient's current medications.  Assessment/Plan: No more epistaxis since right nasal packing was placed yesterday.  Will leave nasal packing in place for 5-7 days.  Will need Gram+ abx coverage while packing is in place (Keflex/Ancef is fine).  Pt will f/u in my office in 5-7 days.  Contact info given to pt.   LOS: 11 days   Becci Batty,SUI W 12/30/2010, 9:50 AM

## 2010-12-31 LAB — CBC
HCT: 21.2 % — ABNORMAL LOW (ref 36.0–46.0)
Hemoglobin: 6.3 g/dL — CL (ref 12.0–15.0)
MCH: 24.9 pg — ABNORMAL LOW (ref 26.0–34.0)
MCHC: 29.7 g/dL — ABNORMAL LOW (ref 30.0–36.0)

## 2010-12-31 LAB — GLUCOSE, CAPILLARY
Glucose-Capillary: 113 mg/dL — ABNORMAL HIGH (ref 70–99)
Glucose-Capillary: 123 mg/dL — ABNORMAL HIGH (ref 70–99)

## 2010-12-31 LAB — BASIC METABOLIC PANEL
BUN: 11 mg/dL (ref 6–23)
Calcium: 9.3 mg/dL (ref 8.4–10.5)
GFR calc non Af Amer: 63 mL/min — ABNORMAL LOW (ref >90)
Glucose, Bld: 128 mg/dL — ABNORMAL HIGH (ref 70–99)
Potassium: 3.8 mEq/L (ref 3.5–5.1)

## 2010-12-31 NOTE — Progress Notes (Signed)
ANTICOAGULATION CONSULT NOTE - Follow Up Consult  Pharmacy Consult for Lovenox Indication: DVT  Allergies  Allergen Reactions  . Versed Anaphylaxis  . Infliximab Nausea And Vomiting  . Iohexol      Code: SOB   . Ketorolac Tromethamine     REACTION: but pt states not injectable  . Methotrexate Other (See Comments)    unknown  . Nalbuphine Other (See Comments)    unknown  . Nsaids Nausea And Vomiting  . Celecoxib Rash    Patient Measurements: Height: 5' 5.5" (166.4 cm) Weight: 196 lb (88.905 kg) IBW/kg (Calculated) : 58.15    Vital Signs: Temp: 98 F (36.7 C) (11/23 0500) Temp src: Oral (11/23 0500) BP: 124/67 mmHg (11/23 0500) Pulse Rate: 92  (11/23 0500)  Labs:  Basename 12/31/10 0443 12/30/10 0638 12/29/10 0925  HGB 6.3* 8.9* --  HCT 21.2* 29.3* 29.1*  PLT 369 354 340  APTT -- -- 32  LABPROT -- -- 14.0  INR -- -- 1.06  HEPARINUNFRC -- -- --  CREATININE 0.99 -- 0.91  CKTOTAL -- -- --  CKMB -- -- --  TROPONINI -- -- --   Estimated Creatinine Clearance: 70.6 ml/min (by C-G formula based on Cr of 0.99).   Medications:  Scheduled:     . ceFAZolin (ANCEF) IV  2 g Intravenous Q8H  . docusate sodium  100 mg Oral BID  . DULoxetine  60 mg Oral BID  . enoxaparin (LOVENOX) injection  130 mg Subcutaneous Q24H  . fentaNYL  150 mcg Transdermal Q72H  . gabapentin  900 mg Oral TID  . hydrochlorothiazide  25 mg Oral Daily  . insulin aspart  0-5 Units Subcutaneous QHS  . insulin aspart  0-9 Units Subcutaneous TID WC  . metoCLOPramide  10 mg Oral BID  . pantoprazole  40 mg Oral Q1200  . potassium chloride SA  20 mEq Oral BID  . sodium chloride  3 mL Intravenous Q12H  . spironolactone  25 mg Oral Daily  . traZODone  150 mg Oral QHS   Infusions:     . sodium chloride    . 0.9 % NaCl with KCl 20 mEq / L 20 mL/hr (12/31/10 0034)   PRN: acetaminophen, acetaminophen, alum & mag hydroxide-simeth, carisoprodol, clonazePAM, HYDROcodone-acetaminophen, HYDROmorphone  (DILAUDID) injection, magnesium hydroxide, ondansetron (ZOFRAN) IV, ondansetron, promethazine, promethazine, sodium chloride Anti-infectives     Start     Dose/Rate Route Frequency Ordered Stop   12/28/10 0000   ceFAZolin (ANCEF) 2-3 GM-% SOLR        2 g 100 mL/hr over 30 Minutes Intravenous Every 8 hours 12/28/10 1506     12/27/10 1800   ceFAZolin (ANCEF) IVPB 2 g/50 mL premix        2 g 100 mL/hr over 30 Minutes Intravenous Every 8 hours 12/27/10 1718     12/22/10 1700   ceFEPIme (MAXIPIME) 2 g in dextrose 5 % 50 mL IVPB  Status:  Discontinued        2 g 100 mL/hr over 30 Minutes Intravenous Every 12 hours 12/22/10 1612 12/27/10 1534   12/20/10 1500   vancomycin (VANCOCIN) IVPB 1000 mg/200 mL premix  Status:  Discontinued        1,000 mg 200 mL/hr over 60 Minutes Intravenous Every 12 hours 12/20/10 0910 12/22/10 1539   12/20/10 0300   vancomycin (VANCOCIN) IVPB 1000 mg/200 mL premix  Status:  Discontinued        1,000 mg 200 mL/hr over 60 Minutes Intravenous  Every 8 hours 12/20/10 0256 12/20/10 0910   12/20/10 0150   piperacillin-tazobactam (ZOSYN) IVPB 3.375 g  Status:  Discontinued        3.375 g 12.5 mL/hr over 240 Minutes Intravenous 3 times per day 12/20/10 0150 12/22/10 1539   12/19/10 2245   piperacillin-tazobactam (ZOSYN) IVPB 3.375 g  Status:  Discontinued        3.375 g 12.5 mL/hr over 240 Minutes Intravenous 3 times per day 12/19/10 2243 12/19/10 2247          Assessment: Day 7 Lovenox for L IJ VTE, associated with PAC. No further epistaxis reported.   Goal of Therapy:  Heparin level 1-2units/l drawn 4hrs after dose.   Plan:  Continue Lovenox 1.5mg /kg/day: 130 mg q24hr. Follow-up daily.  Reece Packer 12/31/2010,8:58 AM

## 2010-12-31 NOTE — Progress Notes (Signed)
UR CHART REVIEWED 

## 2010-12-31 NOTE — Progress Notes (Signed)
Subjective: Patient had a drop in her hemoglobin overnight down to 6.3 and is currently receiving a transfusion of 2 units of packed red blood cells. The patient denies any further active epistaxis. She has had one or 2 occasional clots going down the back of her throat. However presently she has no active bleeding. The patient also complains of redness of her bilateral lower extremities. Objective: Filed Vitals:   12/30/10 1320 12/30/10 2107 12/31/10 0500 12/31/10 1500  BP: 132/80 133/76 124/67 116/75  Pulse: 94 93 92 103  Temp: 97.1 F (36.2 C) 98 F (36.7 C) 98 F (36.7 C) 98.2 F (36.8 C)  TempSrc: Oral Oral Oral   Resp: 18 19 16 18   Height:      Weight:      SpO2: 93% 99% 93% 93%   Weight change:   Intake/Output Summary (Last 24 hours) at 12/31/10 1757 Last data filed at 12/31/10 1500  Gross per 24 hour  Intake    875 ml  Output   2850 ml  Net  -1975 ml   Temp:  [98 F (36.7 C)-98.2 F (36.8 C)] 98.2 F (36.8 C) (11/23 1500) Pulse Rate:  [92-103] 103  (11/23 1500) Resp:  [16-19] 18  (11/23 1500) BP: (116-133)/(67-76) 116/75 mmHg (11/23 1500) SpO2:  [93 %-99 %] 93 % (11/23 1500)   General: Alert, awake, oriented x3, in no acute distress.  HEENT: [place]/AT PEERL, EOMI. right nasal packing in place.  Neck: Trachea midline, no masses, no thyromegal,y no JVD, no carotid bruit  OROPHARYNX: Moist, No exudate/ erythema/lesions.  Heart: Regular rate and rhythm, without murmurs, rubs, gallops, PMI non-displaced, no heaves or thrills on palpation.  Lungs: Clear to auscultation, no wheezing or rhonchi noted. No increased vocal fremitus resonant to percussion  Abdomen: Soft, nontender, nondistended, positive bowel sounds, no masses no hepatosplenomegaly noted..  Neuro: No focal neurological deficits noted cranial nerves II through XII grossly intact. DTRs 2+ bilaterally upper and lower extremities. Strength 5 out of 5 in bilateral upper and lower extremities.  Musculoskeletal: Patient  has erythema on the bilateral lower legs in a stocking fashion. However she has no warmth and no swelling associated with it.Marland Kitchen  Psychiatric: Patient alert and oriented x3, good insight and cognition, good recent to remote recall.  Lymph node survey: No cervical axillary or inguinal lymphadenopathy noted.    Lab Results:  Basename 12/31/10 0443 12/29/10 0925  NA 136 139  K 3.8 4.2  CL 96 99  CO2 31 32  GLUCOSE 128* 109*  BUN 11 11  CREATININE 0.99 0.91  CALCIUM 9.3 9.3  MG -- --  PHOS -- --   No results found for this basename: AST:2,ALT:2,ALKPHOS:2,BILITOT:2,PROT:2,ALBUMIN:2 in the last 72 hours No results found for this basename: LIPASE:2,AMYLASE:2 in the last 72 hours  Basename 12/31/10 0443 12/30/10 0638 12/29/10 0925  WBC 11.9* 11.2* --  NEUTROABS -- 7.4 5.0  HGB 6.3* 8.9* --  HCT 21.2* 29.3* --  MCV 83.8 83.5 --  PLT 369 354 --   No results found for this basename: CKTOTAL:3,CKMB:3,CKMBINDEX:3,TROPONINI:3 in the last 72 hours No results found for this basename: POCBNP:3 in the last 72 hours No results found for this basename: DDIMER:2 in the last 72 hours No results found for this basename: HGBA1C:2 in the last 72 hours No results found for this basename: CHOL:2,HDL:2,LDLCALC:2,TRIG:2,CHOLHDL:2,LDLDIRECT:2 in the last 72 hours No results found for this basename: TSH,T4TOTAL,FREET3,T3FREE,THYROIDAB in the last 72 hours No results found for this basename: VITAMINB12:2,FOLATE:2,FERRITIN:2,TIBC:2,IRON:2,RETICCTPCT:2 in the  last 72 hours  Micro Results: Recent Results (from the past 240 hour(s))  WOUND CULTURE (INCLUDES ANAEROBIC)     Status: Normal   Collection Time   12/23/10  2:05 PM      Component Value Range Status Comment   Specimen Description WOUND LEFT CHEST PORT A CATH POCKET   Final    Special Requests PATIENT ON FOLLOWING ZOSYN   Final    Gram Stain     Final    Value: RARE WBC PRESENT,BOTH PMN AND MONONUCLEAR     NO SQUAMOUS EPITHELIAL CELLS SEEN     NO  ORGANISMS SEEN   Culture NO GROWTH 2 DAYS   Final    Report Status 12/26/2010 FINAL   Final   ANAEROBIC CULTURE     Status: Normal   Collection Time   12/23/10  2:05 PM      Component Value Range Status Comment   Specimen Description WOUND LEFT CHEST PORT A CATH POCKET   Final    Special Requests PATIENT ON FOLLOWING ZOSYN   Final    Gram Stain     Final    Value: RARE WBC PRESENT,BOTH PMN AND MONONUCLEAR     NO SQUAMOUS EPITHELIAL CELLS SEEN     NO ORGANISMS SEEN   Culture NO ANAEROBES ISOLATED   Final    Report Status 12/28/2010 FINAL   Final   CATH TIP CULTURE     Status: Normal   Collection Time   12/23/10  2:05 PM      Component Value Range Status Comment   Specimen Description CATH TIP LEFT CHEST PORT A CATH   Final    Special Requests PATIENT ON FOLLOWING ZOSYN   Final    Culture NO GROWTH 2 DAYS   Final    Report Status 12/26/2010 FINAL   Final     Studies/Results: Ct Chest Wo Contrast  12/21/2010  *RADIOLOGY REPORT*  Clinical Data: Status post bilateral mastectomy with cellulitis and fever.  CT CHEST WITHOUT CONTRAST  Technique:  Multidetector CT imaging of the chest was performed following the standard protocol without IV contrast.  Comparison: None  Findings: There are skin staples and surgical changes from bilateral mastectomies.  Bilateral interstitial changes in the subcutaneous fat suggesting cellulitis.  There is a large postoperative fluid collection on the right side measuring 12 x 4 cm.  This could be a postoperative liquefied hematoma but cannot exclude an abscess.  The Port-A-Cath is been good position without complicating features.  The underlying bony thorax is intact.  The heart is normal in size.  No pericardial effusion.  No mediastinal or hilar lymphadenopathy.  The esophagus is grossly normal.  Examination of the lung parenchyma demonstrates no acute pulmonary findings.  No infiltrates, effusions or edema.  No worrisome pulmonary nodules or masses.  The upper  abdomen is unremarkable.  The gallbladder is contracted.  IMPRESSION:  1.  Bilateral chest wall cellulitis. 2.  12 x 4 cm fluid collection in the subcutaneous fat on the right side could reflect a postoperative hematoma, seroma or abscess. 3.  No acute pulmonary findings.  Original Report Authenticated By: P. Loralie Champagne, M.D.   Ir Fluoro Guide Cv Line Right  12/26/2010  *RADIOLOGY REPORT*  Clinical data: Breast cancer.  Wound infection, needs access for IV antibiotics. 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  RIGHT IJ TUNNELED CENTRAL VENOUS CATHETER PLACEMENT UNDER ULTRASOUND AND FLUOROSCOPIC GUIDANCE:  Technique and findings: The patient was premedicated  for examination.  The procedure, risks (including but not limited to bleeding, infection, organ damage), benefits, and alternatives were explained to the patient.  Questions regarding the procedure were encouraged and answered.  The patient understands and consents to the procedure. Patency of the right IJ vein was confirmed with ultrasound with image documentation. Noncompressible thrombus was noted in the left IJ vein.  An appropriate skin site was determined. Skin site was marked. Region was prepped using maximum barrier technique including cap and mask, sterile gown, sterile gloves, large sterile sheet, and Chlorhexidine   as cutaneous antisepsis.  The region was infiltrated locally with 1% lidocaine. Under real-time ultrasound guidance, the right IJ vein was accessed with a 21 gauge micropuncture needle; the needle tip within the vein was confirmed with ultrasound image documentation. A 5-French cuffed power injectable dual lumen catheter was tunneled from a right anterior chest wall approach to the IJ dermatotomy site.  The needle exchanged over a guidewire for peel-away sheath through which an the 5 [place] power injectable dual lumen catheter was advanced. This was positioned with the tip at the cavoatrial junction.  Spot chest radiograph shows good positioning and no pneumothorax. Catheter was flushed and sutured externally with 0-Prolene sutures. IJ dermatotomy closed with Dermabond.  Patient tolerated the procedure well, with no immediate complication.  IMPRESSION: 1. Technically successful right IJ tunneled double lumen power injectable central venous catheter placement.  Original Report Authenticated By: Osa Craver, M.D.   Ir US Guide Vasc Access Right  12/26/2010  *RADIOLOGY REPORT*  Clinical data: Breast cancer.  Wound infection, needs access for IV antibiotics. 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  RIGHT IJ TUNNELED CENTRAL VENOUS CATHETER PLACEMENT UNDER ULTRASOUND AND FLUOROSCOPIC GUIDANCE:  Technique and findings: The patient was premedicated for examination.  The procedure, risks (including but not limited to bleeding, infection, organ damage), benefits, and alternatives were explained to the patient.  Questions regarding the procedure were encouraged and answered.  The patient understands and consents to the procedure. Patency of the right IJ vein was confirmed with ultrasound with image documentation. Noncompressible thrombus was noted in the left IJ vein.  An appropriate skin site was determined. Skin site was marked. Region was prepped using maximum barrier technique including cap and mask, sterile gown, sterile gloves, large sterile sheet, and Chlorhexidine   as cutaneous antisepsis.  The region was infiltrated locally with 1% lidocaine. Under real-time ultrasound guidance, the right IJ vein was accessed with a 21 gauge micropuncture needle; the needle tip within the vein was confirmed with ultrasound image documentation. A 5-French cuffed power injectable dual lumen catheter was tunneled from a right anterior chest wall approach to the IJ dermatotomy site.  The needle exchanged over a guidewire for peel-away sheath through which an the 5 [place] power injectable  dual lumen catheter was advanced. This was positioned with the tip at the cavoatrial junction. Spot chest radiograph shows good positioning and no pneumothorax. Catheter was flushed and sutured externally with 0-Prolene sutures. IJ dermatotomy closed with Dermabond.  Patient tolerated the procedure well, with no immediate complication.  IMPRESSION: 1. Technically successful right IJ tunneled double lumen power injectable central venous catheter placement.  Original Report Authenticated By: Osa Craver, M.D.   Dg Chest Port 1v Same Day  12/23/2010  *RADIOLOGY REPORT*  Clinical Data: Status post attempted central line placement.  PORTABLE CHEST - 1 VIEW SAME DAY  Comparison: Chest x-ray 11/26/2010.  Findings: Port-A-Cath has been removed.  Surgical staples are seen over the right and left chest.  There is mild left basilar atelectasis.  There is no visible pneumothorax.  IMPRESSION: No visible pneumothorax.  Mild left basilar atelectasis.  Original Report Authenticated By: Elsie Stain, M.D.    Medications: I have reviewed the patient's current medications. Scheduled Meds:   . ceFAZolin (ANCEF) IV  2 g Intravenous Q8H  . docusate sodium  100 mg Oral BID  . DULoxetine  60 mg Oral BID  . enoxaparin (LOVENOX) injection  130 mg Subcutaneous Q24H  . fentaNYL  150 mcg Transdermal Q72H  . gabapentin  900 mg Oral TID  . hydrochlorothiazide  25 mg Oral Daily  . insulin aspart  0-5 Units Subcutaneous QHS  . insulin aspart  0-9 Units Subcutaneous TID WC  . metoCLOPramide  10 mg Oral BID  . pantoprazole  40 mg Oral Q1200  . potassium chloride SA  20 mEq Oral BID  . sodium chloride  3 mL Intravenous Q12H  . spironolactone  25 mg Oral Daily  . traZODone  150 mg Oral QHS   Continuous Infusions:   . sodium chloride    . 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 The patient has a chronic anemia which is now worsened by acute blood loss the review secondary to epistaxis. The patient is receiving 2 units of packed red blood cells and recheck her hemoglobin and hematocrit one-hour posttransfusion. There is no active bleeding occurring at this time and is to continue with Lovenox for treatment of the DVT and the internal jugular vein. If however the patient's hematocrit continues to drop then discontinue the Lovenox may have to be an option for this patient.  2. 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  3. Deep vein thrombosis: Patient is continued on Lovenox. Please see above  4. Diabetes type 2: Blood sugars are adequately controlled  Disposition is to skilled nursing facility at the time of discharge.  LOS: 12 days

## 2011-01-01 LAB — GLUCOSE, CAPILLARY: Glucose-Capillary: 130 mg/dL — ABNORMAL HIGH (ref 70–99)

## 2011-01-01 LAB — CBC
HCT: 31.9 % — ABNORMAL LOW (ref 36.0–46.0)
Hemoglobin: 10.3 g/dL — ABNORMAL LOW (ref 12.0–15.0)
MCHC: 32.3 g/dL (ref 30.0–36.0)
RBC: 3.84 MIL/uL — ABNORMAL LOW (ref 3.87–5.11)
WBC: 7.4 10*3/uL (ref 4.0–10.5)

## 2011-01-01 MED ORDER — SODIUM CHLORIDE 0.45 % IV SOLN: INTRAVENOUS | Status: DC

## 2011-01-01 MED ORDER — BENZOCAINE 20 % MT SOLN: 1.0000 "application " | OROMUCOSAL | Status: DC | PRN

## 2011-01-01 MED ORDER — FENTANYL CITRATE 0.05 MG/ML IJ SOLN: 250.0000 ug | Freq: Once | INTRAMUSCULAR | Status: DC

## 2011-01-01 MED ORDER — HYDROMORPHONE HCL 4 MG PO TABS
4.0000 mg | ORAL_TABLET | ORAL | Status: DC | PRN
  Administered 2011-01-01 – 2011-01-03 (×6): 4 mg via ORAL
  Filled 2011-01-01 (×6): qty 1

## 2011-01-01 MED ORDER — PREDNISONE 50 MG PO TABS: 50.0000 mg | ORAL_TABLET | Freq: Every day | ORAL | Status: DC

## 2011-01-01 MED ORDER — PREDNISONE 50 MG PO TABS: 50.0000 mg | ORAL_TABLET | Freq: Once | ORAL | Status: DC

## 2011-01-01 NOTE — Progress Notes (Signed)
Subjective: Patient has had no further bleeding. She does complain of some nasal congestion which is expected. Objective: Filed Vitals:   01/01/11 0519 01/01/11 0531 01/01/11 0556 01/01/11 1405  BP: 140/85 150/66 108/66 148/75  Pulse: 90 86 91 81  Temp: 97.9 F (36.6 C) 97.7 F (36.5 C) 97.8 F (36.6 C) 97.9 F (36.6 C)  TempSrc:    Oral  Resp: 16 18 18 18   Height:      Weight:      SpO2:  93% 92% 99%   Weight change:   Intake/Output Summary (Last 24 hours) at 01/01/11 1813 Last data filed at 01/01/11 1430  Gross per 24 hour  Intake 2285.33 ml  Output   4900 ml  Net -2614.67 ml  Temp:  [97.5 F (36.4 C)-98.3 F (36.8 C)] 97.9 F (36.6 C) (11/24 1405) Pulse Rate:  [81-92] 81  (11/24 1405) Resp:  [15-18] 18  (11/24 1405) BP: (108-153)/(66-86) 148/75 mmHg (11/24 1405) SpO2:  [86 %-99 %] 99 % (11/24 1405)  General: Alert, awake, oriented x3, in no acute distress.  HEENT: [place]/AT PEERL, EOMI. right nasal packing in place.  Neck: Trachea midline, no masses, no thyromegal,y no JVD, no carotid bruit  OROPHARYNX: Moist, No exudate/ erythema/lesions.  Heart: Regular rate and rhythm, without murmurs, rubs, gallops, PMI non-displaced, no heaves or thrills on palpation.  Lungs: Clear to auscultation, no wheezing or rhonchi noted. No increased vocal fremitus resonant to percussion  Abdomen: Soft, nontender, nondistended, positive bowel sounds, no masses no hepatosplenomegaly noted..  Neuro: No focal neurological deficits noted cranial nerves II through XII grossly intact. DTRs 2+ bilaterally upper and lower extremities. Strength 5 out of 5 in bilateral upper and lower extremities.  Musculoskeletal: Patient has erythema on the bilateral lower legs in a stocking fashion. However she has no warmth and no swelling associated with it.Marland Kitchen  Psychiatric: Patient alert and oriented x3, good insight and cognition, good recent to remote recall.  Lymph node survey: No cervical axillary or inguinal  lymphadenopathy noted.   Lab Results:  [facility] 12/31/10 0443  NA 136  K 3.8  CL 96  CO2 31  GLUCOSE 128*  BUN 11  CREATININE 0.99  CALCIUM 9.3  MG --  PHOS --   No results found for this basename: AST:2,ALT:2,ALKPHOS:2,BILITOT:2,PROT:2,ALBUMIN:2 in the last 72 hours No results found for this basename: LIPASE:2,AMYLASE:2 in the last 72 hours  Basename 01/01/11 0454 12/31/10 0443 12/30/10 0638  WBC 7.4 11.9* --  NEUTROABS -- -- 7.4  HGB 10.3* 6.3* --  HCT 31.9* 21.2* --  MCV 83.1 83.8 --  PLT 300 369 --   No results found for this basename: CKTOTAL:3,CKMB:3,CKMBINDEX:3,TROPONINI:3 in the last 72 hours No results found for this basename: POCBNP:3 in the last 72 hours No results found for this basename: DDIMER:2 in the last 72 hours No results found for this basename: HGBA1C:2 in the last 72 hours No results found for this basename: CHOL:2,HDL:2,LDLCALC:2,TRIG:2,CHOLHDL:2,LDLDIRECT:2 in the last 72 hours No results found for this basename: TSH,T4TOTAL,FREET3,T3FREE,THYROIDAB in the last 72 hours No results found for this basename: VITAMINB12:2,FOLATE:2,FERRITIN:2,TIBC:2,IRON:2,RETICCTPCT:2 in the last 72 hours  Micro Results: Recent Results (from the past 240 hour(s))  WOUND CULTURE (INCLUDES ANAEROBIC)     Status: Normal   Collection Time   12/23/10  2:05 PM      Component Value Range Status Comment   Specimen Description WOUND LEFT CHEST PORT A CATH POCKET   Final    Special Requests PATIENT ON FOLLOWING ZOSYN  Final    Gram Stain     Final    Value: RARE WBC PRESENT,BOTH PMN AND MONONUCLEAR     NO SQUAMOUS EPITHELIAL CELLS SEEN     NO ORGANISMS SEEN   Culture NO GROWTH 2 DAYS   Final    Report Status 12/26/2010 FINAL   Final   ANAEROBIC CULTURE     Status: Normal   Collection Time   12/23/10  2:05 PM      Component Value Range Status Comment   Specimen Description WOUND LEFT CHEST PORT A CATH POCKET   Final    Special Requests PATIENT ON FOLLOWING ZOSYN   Final     Gram Stain     Final    Value: RARE WBC PRESENT,BOTH PMN AND MONONUCLEAR     NO SQUAMOUS EPITHELIAL CELLS SEEN     NO ORGANISMS SEEN   Culture NO ANAEROBES ISOLATED   Final    Report Status 12/28/2010 FINAL   Final   CATH TIP CULTURE     Status: Normal   Collection Time   12/23/10  2:05 PM      Component Value Range Status Comment   Specimen Description CATH TIP LEFT CHEST PORT A CATH   Final    Special Requests PATIENT ON FOLLOWING ZOSYN   Final    Culture NO GROWTH 2 DAYS   Final    Report Status 12/26/2010 FINAL   Final     Studies/Results: Ct Chest Wo Contrast  12/21/2010  *RADIOLOGY REPORT*  Clinical Data: Status post bilateral mastectomy with cellulitis and fever.  CT CHEST WITHOUT CONTRAST  Technique:  Multidetector CT imaging of the chest was performed following the standard protocol without IV contrast.  Comparison: None  Findings: There are skin staples and surgical changes from bilateral mastectomies.  Bilateral interstitial changes in the subcutaneous fat suggesting cellulitis.  There is a large postoperative fluid collection on the right side measuring 12 x 4 cm.  This could be a postoperative liquefied hematoma but cannot exclude an abscess.  The Port-A-Cath is been good position without complicating features.  The underlying bony thorax is intact.  The heart is normal in size.  No pericardial effusion.  No mediastinal or hilar lymphadenopathy.  The esophagus is grossly normal.  Examination of the lung parenchyma demonstrates no acute pulmonary findings.  No infiltrates, effusions or edema.  No worrisome pulmonary nodules or masses.  The upper abdomen is unremarkable.  The gallbladder is contracted.  IMPRESSION:  1.  Bilateral chest wall cellulitis. 2.  12 x 4 cm fluid collection in the subcutaneous fat on the right side could reflect a postoperative hematoma, seroma or abscess. 3.  No acute pulmonary findings.  Original Report Authenticated By: P. Loralie Champagne, M.D.   Ir  Fluoro Guide Cv Line Right  12/26/2010  *RADIOLOGY REPORT*  Clinical data: Breast cancer.  Wound infection, needs access for IV antibiotics. 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  RIGHT IJ TUNNELED CENTRAL VENOUS CATHETER PLACEMENT UNDER ULTRASOUND AND FLUOROSCOPIC GUIDANCE:  Technique and findings: The patient was premedicated for examination.  The procedure, risks (including but not limited to bleeding, infection, organ damage), benefits, and alternatives were explained to the patient.  Questions regarding the procedure were encouraged and answered.  The patient understands and consents to the procedure. Patency of the right IJ vein was confirmed with ultrasound with image documentation. Noncompressible thrombus was noted in the left IJ vein.  An appropriate skin site was  determined. Skin site was marked. Region was prepped using maximum barrier technique including cap and mask, sterile gown, sterile gloves, large sterile sheet, and Chlorhexidine   as cutaneous antisepsis.  The region was infiltrated locally with 1% lidocaine. Under real-time ultrasound guidance, the right IJ vein was accessed with a 21 gauge micropuncture needle; the needle tip within the vein was confirmed with ultrasound image documentation. A 5-French cuffed power injectable dual lumen catheter was tunneled from a right anterior chest wall approach to the IJ dermatotomy site.  The needle exchanged over a guidewire for peel-away sheath through which an the 5 [place] power injectable dual lumen catheter was advanced. This was positioned with the tip at the cavoatrial junction. Spot chest radiograph shows good positioning and no pneumothorax. Catheter was flushed and sutured externally with 0-Prolene sutures. IJ dermatotomy closed with Dermabond.  Patient tolerated the procedure well, with no immediate complication.  IMPRESSION: 1. Technically successful right IJ tunneled double lumen power injectable central  venous catheter placement.  Original Report Authenticated By: Osa Craver, M.D.   Ir US Guide Vasc Access Right  12/26/2010  *RADIOLOGY REPORT*  Clinical data: Breast cancer.  Wound infection, needs access for IV antibiotics. 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  RIGHT IJ TUNNELED CENTRAL VENOUS CATHETER PLACEMENT UNDER ULTRASOUND AND FLUOROSCOPIC GUIDANCE:  Technique and findings: The patient was premedicated for examination.  The procedure, risks (including but not limited to bleeding, infection, organ damage), benefits, and alternatives were explained to the patient.  Questions regarding the procedure were encouraged and answered.  The patient understands and consents to the procedure. Patency of the right IJ vein was confirmed with ultrasound with image documentation. Noncompressible thrombus was noted in the left IJ vein.  An appropriate skin site was determined. Skin site was marked. Region was prepped using maximum barrier technique including cap and mask, sterile gown, sterile gloves, large sterile sheet, and Chlorhexidine   as cutaneous antisepsis.  The region was infiltrated locally with 1% lidocaine. Under real-time ultrasound guidance, the right IJ vein was accessed with a 21 gauge micropuncture needle; the needle tip within the vein was confirmed with ultrasound image documentation. A 5-French cuffed power injectable dual lumen catheter was tunneled from a right anterior chest wall approach to the IJ dermatotomy site.  The needle exchanged over a guidewire for peel-away sheath through which an the 5 [place] power injectable dual lumen catheter was advanced. This was positioned with the tip at the cavoatrial junction. Spot chest radiograph shows good positioning and no pneumothorax. Catheter was flushed and sutured externally with 0-Prolene sutures. IJ dermatotomy closed with Dermabond.  Patient tolerated the procedure well, with no immediate complication.   IMPRESSION: 1. Technically successful right IJ tunneled double lumen power injectable central venous catheter placement.  Original Report Authenticated By: Osa Craver, M.D.   Dg Chest Port 1v Same Day  12/23/2010  *RADIOLOGY REPORT*  Clinical Data: Status post attempted central line placement.  PORTABLE CHEST - 1 VIEW SAME DAY  Comparison: Chest x-ray 11/26/2010.  Findings: Port-A-Cath has been removed.  Surgical staples are seen over the right and left chest.  There is mild left basilar atelectasis.  There is no visible pneumothorax.  IMPRESSION: No visible pneumothorax.  Mild left basilar atelectasis.  Original Report Authenticated By: Elsie Stain, M.D.    Medications: I have reviewed the patient's current medications. Scheduled Meds:   . ceFAZolin (ANCEF) IV  2 g Intravenous Q8H  .  docusate sodium  100 mg Oral BID  . DULoxetine  60 mg Oral BID  . enoxaparin (LOVENOX) injection  130 mg Subcutaneous Q24H  . fentaNYL  150 mcg Transdermal Q72H  . gabapentin  900 mg Oral TID  . hydrochlorothiazide  25 mg Oral Daily  . insulin aspart  0-5 Units Subcutaneous QHS  . insulin aspart  0-9 Units Subcutaneous TID WC  . metoCLOPramide  10 mg Oral BID  . pantoprazole  40 mg Oral Q1200  . potassium chloride SA  20 mEq Oral BID  . sodium chloride  3 mL Intravenous Q12H  . spironolactone  25 mg Oral Daily  . traZODone  150 mg Oral QHS  . DISCONTD: fentaNYL  250 mcg Intravenous Once  . DISCONTD: predniSONE  50 mg Oral Once  . DISCONTD: predniSONE  50 mg Oral QAC breakfast   Continuous Infusions:   . sodium chloride    . 0.9 % NaCl with KCl 20 mEq / L 20 mL/hr (12/31/10 0034)  . 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  Chronic pain syndrome (09/07/2009)   Assessment: We'll DC IV Dilaudid and resume by mouth Dilaudid.   DVT (deep venous thrombosis) (12/26/2010)   Assessment: Continue Lovenox  Normocytic anemia (12/27/2010)   Assessment: Post transfusion hemoglobin stable. Will recheck in the a.m. to ensure continued stability.      LOS: 13 days

## 2011-01-01 NOTE — Progress Notes (Signed)
Subjective: Pt reports no more bleeding. She is tolerating po well.  No new ENT c/o.  Objective: Vital signs in last 24 hours: Temp:  [97.5 F (36.4 C)-98.8 F (37.1 C)] 97.8 F (36.6 C) (11/24 0556) Pulse Rate:  [81-103] 91  (11/24 0556) Resp:  [15-18] 18  (11/24 0556) BP: (108-153)/(66-86) 108/66 mmHg (11/24 0556) SpO2:  [86 %-93 %] 92 % (11/24 0556)  General appearance: alert, cooperative and no distress  Head: Normocephalic, without obvious abnormality, atraumatic, sinuses nontender to percussion  Eyes: conjunctivae/corneas clear. PERRL, EOM's intact. Fundi benign.  Ears: normal TM's and external ear canals both ears  Nose: Right nasal packing in place. No bleeding. 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  Throat: lips, mucosa, and tongue normal; teeth and gums normal and no bleeding is noted.  Neck: no adenopathy, no carotid bruit, no JVD, supple, symmetrical, trachea midline and thyroid not enlarged, symmetric, no tenderness/mass/nodules  Lymph nodes: Cervical, supraclavicular, and axillary nodes normal.  Neurologic: Grossly normal   Basename 01/01/11 0454 12/31/10 0443  WBC 7.4 11.9*  HGB 10.3* 6.3*  HCT 31.9* 21.2*  PLT 300 369    Basename 12/31/10 0443  NA 136  K 3.8  CL 96  CO2 31  GLUCOSE 128*  BUN 11  CREATININE 0.99  CALCIUM 9.3    Medications:  I have reviewed the patient's current medications. Scheduled:   . ceFAZolin (ANCEF) IV  2 g Intravenous Q8H  . docusate sodium  100 mg Oral BID  . DULoxetine  60 mg Oral BID  . enoxaparin (LOVENOX) injection  130 mg Subcutaneous Q24H  . fentaNYL  150 mcg Transdermal Q72H  . gabapentin  900 mg Oral TID  . hydrochlorothiazide  25 mg Oral Daily  . insulin aspart  0-5 Units Subcutaneous QHS  . insulin aspart  0-9 Units Subcutaneous TID WC  . metoCLOPramide  10 mg Oral BID  . pantoprazole  40 mg Oral Q1200  . potassium chloride SA  20 mEq Oral BID  . sodium chloride  3 mL Intravenous Q12H  .  spironolactone  25 mg Oral Daily  . traZODone  150 mg Oral QHS  . DISCONTD: fentaNYL  250 mcg Intravenous Once  . DISCONTD: predniSONE  50 mg Oral Once  . DISCONTD: predniSONE  50 mg Oral QAC breakfast   Continuous:   . sodium chloride    . 0.9 % NaCl with KCl 20 mEq / L 20 mL/hr (12/31/10 0034)  . DISCONTD: sodium chloride      Assessment/Plan: No more epistaxis since right nasal packing was placed. Will leave nasal packing in place for 5-7 days. Will need Gram+ abx coverage while packing is in place (Keflex/Ancef is fine). Pt will f/u in my office in early next week. Contact info given to pt.   LOS: 13 days   [name] Carr,SUI W 01/01/2011, 10:38 AM

## 2011-01-02 LAB — BASIC METABOLIC PANEL
BUN: 7 mg/dL (ref 6–23)
CO2: 32 mEq/L (ref 19–32)
Calcium: 9.4 mg/dL (ref 8.4–10.5)
GFR calc non Af Amer: 67 mL/min — ABNORMAL LOW (ref >90)
Glucose, Bld: 125 mg/dL — ABNORMAL HIGH (ref 70–99)
Sodium: 135 mEq/L (ref 135–145)

## 2011-01-02 LAB — CBC
HCT: 33.7 % — ABNORMAL LOW (ref 36.0–46.0)
Hemoglobin: 10.5 g/dL — ABNORMAL LOW (ref 12.0–15.0)
MCH: 26.1 pg (ref 26.0–34.0)
MCHC: 31.2 g/dL (ref 30.0–36.0)
RBC: 4.03 MIL/uL (ref 3.87–5.11)

## 2011-01-02 LAB — GLUCOSE, CAPILLARY
Glucose-Capillary: 143 mg/dL — ABNORMAL HIGH (ref 70–99)
Glucose-Capillary: 114 mg/dL — ABNORMAL HIGH (ref 70–99)
Glucose-Capillary: 122 mg/dL — ABNORMAL HIGH (ref 70–99)

## 2011-01-02 NOTE — Progress Notes (Signed)
Subjective: Pt reports no nasal bleeding. No other complaints.  Objective: Vital signs in last 24 hours: Temp:  [98 F (36.7 C)-98.6 F (37 C)] 98.6 F (37 C) (11/25 1329) Pulse Rate:  [88-90] 89  (11/25 1329) Resp:  [18-20] 18  (11/25 1329) BP: (114-124)/(60-73) 124/73 mmHg (11/25 1329) SpO2:  [92 %-98 %] 93 % (11/25 1329)  General appearance: alert, cooperative and no distress  Head: Normocephalic, without obvious abnormality, atraumatic, sinuses nontender to percussion  Eyes: conjunctivae/corneas clear. PERRL, EOM's intact. Fundi benign.  Ears: normal TM's and external ear canals both ears  Nose: Right nasal packing in place. No bleeding. 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  Throat: lips, mucosa, and tongue normal; teeth and gums normal and no bleeding is noted.  Neck: no adenopathy, no carotid bruit, no JVD, supple, symmetrical, trachea midline and thyroid not enlarged, symmetric, no tenderness/mass/nodules  Lymph nodes: Cervical, supraclavicular, and axillary nodes normal.  Neurologic: Grossly normal   Basename 01/02/11 0500 01/01/11 0454  WBC 6.8 7.4  HGB 10.5* 10.3*  HCT 33.7* 31.9*  PLT 328 300    Basename 01/02/11 0500 12/31/10 0443  NA 135 136  K 3.8 3.8  CL 96 96  CO2 32 31  GLUCOSE 125* 128*  BUN 7 11  CREATININE 0.94 0.99  CALCIUM 9.4 9.3    Medications:  I have reviewed the patient's current medications. Scheduled:   . ceFAZolin (ANCEF) IV  2 g Intravenous Q8H  . docusate sodium  100 mg Oral BID  . DULoxetine  60 mg Oral BID  . enoxaparin (LOVENOX) injection  130 mg Subcutaneous Q24H  . fentaNYL  150 mcg Transdermal Q72H  . gabapentin  900 mg Oral TID  . hydrochlorothiazide  25 mg Oral Daily  . insulin aspart  0-5 Units Subcutaneous QHS  . insulin aspart  0-9 Units Subcutaneous TID WC  . metoCLOPramide  10 mg Oral BID  . pantoprazole  40 mg Oral Q1200  . potassium chloride SA  20 mEq Oral BID  . sodium chloride  3 mL Intravenous Q12H  .  spironolactone  25 mg Oral Daily  . traZODone  150 mg Oral QHS    Assessment/Plan: No more epistaxis since right nasal packing was placed. Will leave nasal packing in place until early next week. Will need Gram+ abx coverage while packing is in place (Keflex/Ancef is fine). Will remove packing tomorrow if pt is still in the hospital.  Otherwise pt will f/u in my office early next week. Contact info given to pt.    LOS: 14 days   [name] Carr,SUI W 01/02/2011, 5:00 PM

## 2011-01-02 NOTE — Discharge Summary (Signed)
[name] Carr MRN: [id number] DOB/AGE: 05-02-1955 55 y.o.  Admit date: 12/19/2010 Discharge date: 01/02/2011  Primary Care Physician:  Rene Paci, MD, MD   Discharge Diagnoses:   Patient Active Problem List  Diagnoses  . CARCINOMA, BREAST  . DIABETES MELLITUS, TYPE II  . HYPERLIPIDEMIA  . ANEMIA-IRON DEFICIENCY  . PERSONALITY DISORDER  . DEPRESSION  . Chronic pain syndrome  . MIGRAINE HEADACHE  . ALLERGIC RHINITIS  . GERD  . RENAL INSUFFICIENCY  . SEIZURE DISORDER  . Infection due to central venous catheter  . Immunocompromised patient  . Hypokalemia  . Neck pain  . Edema extremities  . DVT (deep venous thrombosis)  . Normocytic anemia  . Epistaxis    DISCHARGE MEDICATION: Current Discharge Medication List    START taking these medications   Details  ceFAZolin (ANCEF) 2-3 GM-% SOLR Inject 50 mLs (2 g total) into the vein every 8 (eight) hours. Qty: 90 each, Refills: 0    enoxaparin (LOVENOX) 150 MG/ML injection Inject 0.87 mLs (130 mg total) into the skin daily. Qty: 30 mL, Refills: 3      CONTINUE these medications which have CHANGED   Details  HYDROmorphone (DILAUDID) 2 MG tablet Take 2 tablets (4 mg total) by mouth every 4 (four) hours as needed. Qty: 60 tablet, Refills: 0      CONTINUE these medications which have NOT CHANGED   Details  carisoprodol (SOMA) 350 MG tablet TAKE 1 TABLET BY MOUTH FOUR TIMES DAILY AS NEEDED Qty: 120 tablet, Refills: 0    clobetasol (TEMOVATE) 0.05 % ointment Apply topically 2 (two) times daily. Qty: 30 g, Refills: 0   Associated Diagnoses: Psoriasis    clonazePAM (KLONOPIN) 1 MG tablet Take 1 tablet (1 mg total) by mouth 2 (two) times daily as needed. Qty: 60 tablet, Refills: 6    docusate sodium (COLACE) 100 MG capsule Take 100 mg by mouth 2 (two) times daily.      DULoxetine (CYMBALTA) 60 MG capsule Take 1 capsule (60 mg total) by mouth 2 (two) times daily. Qty: 60 capsule, Refills: 6   Associated Diagnoses:  Chronic pain syndrome    EPINEPHrine (EPIPEN) 0.3 mg/0.3 mL DEVI Inject 0.3 mLs (0.3 mg total) into the muscle once. Qty: 1 Device, Refills: 1    fentaNYL (DURAGESIC - DOSED MCG/HR) 75 MCG/HR Place 2 patches onto the skin every 3 (three) days.     furosemide (LASIX) 20 MG tablet Take 1 tablet (20 mg total) by mouth daily. Qty: 30 tablet, Refills: 3   Associated Diagnoses: Edema    gabapentin (NEURONTIN) 300 MG capsule Take 3 capsules (900 mg total) by mouth 3 (three) times daily. Qty: 270 capsule, Refills: 3    hydrochlorothiazide (HYDRODIURIL) 25 MG tablet TAKE 1 TABLET BY MOUTH EVERY DAY Qty: 30 tablet, Refills: 5    metoCLOPramide (REGLAN) 5 MG tablet Take 2 tablets (10 mg total) by mouth 2 (two) times daily. Qty: 60 tablet, Refills: 1   Associated Diagnoses: Abdominal pain, unspecified site; Nausea with vomiting    omeprazole (PRILOSEC) 40 MG capsule Take 40 mg by mouth every morning.      potassium chloride SA (K-DUR,KLOR-CON) 20 MEQ tablet Take 1 tablet (20 mEq total) by mouth 2 (two) times daily. Or as directed Qty: 60 tablet, Refills: 3   Associated Diagnoses: Hypopotassemia    Probiotic Product (PROBIOTIC FORMULA) CAPS Take by mouth daily.      spironolactone-hydrochlorothiazide (ALDACTAZIDE) 25-25 MG per tablet Take 1 tablet by mouth 2 (  two) times daily. Qty: 60 tablet, Refills: 5    traZODone (DESYREL) 150 MG tablet Take 150 mg by mouth at bedtime.        STOP taking these medications     predniSONE (DELTASONE) 10 MG tablet            Consults: Treatment Team:  Levert Feinstein, MD Darletta Moll   SIGNIFICANT DIAGNOSTIC STUDIES:  Ct Chest Wo Contrast  12/21/2010  *RADIOLOGY REPORT*  Clinical Data: Status post bilateral mastectomy with cellulitis and fever.  CT CHEST WITHOUT CONTRAST  Technique:  Multidetector CT imaging of the chest was performed following the standard protocol without IV contrast.  Comparison: None  Findings: There are skin staples and  surgical changes from bilateral mastectomies.  Bilateral interstitial changes in the subcutaneous fat suggesting cellulitis.  There is a large postoperative fluid collection on the right side measuring 12 x 4 cm.  This could be a postoperative liquefied hematoma but cannot exclude an abscess.  The Port-A-Cath is been good position without complicating features.  The underlying bony thorax is intact.  The heart is normal in size.  No pericardial effusion.  No mediastinal or hilar lymphadenopathy.  The esophagus is grossly normal.  Examination of the lung parenchyma demonstrates no acute pulmonary findings.  No infiltrates, effusions or edema.  No worrisome pulmonary nodules or masses.  The upper abdomen is unremarkable.  The gallbladder is contracted.  IMPRESSION:  1.  Bilateral chest wall cellulitis. 2.  12 x 4 cm fluid collection in the subcutaneous fat on the right side could reflect a postoperative hematoma, seroma or abscess. 3.  No acute pulmonary findings.  Original Report Authenticated By: P. Loralie Champagne, M.D.   Ir Fluoro Guide Cv Line Right  12/26/2010  *RADIOLOGY REPORT*  Clinical data: Breast cancer.  Wound infection, needs access for IV antibiotics. 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  RIGHT IJ TUNNELED CENTRAL VENOUS CATHETER PLACEMENT UNDER ULTRASOUND AND FLUOROSCOPIC GUIDANCE:  Technique and findings: The patient was premedicated for examination.  The procedure, risks (including but not limited to bleeding, infection, organ damage), benefits, and alternatives were explained to the patient.  Questions regarding the procedure were encouraged and answered.  The patient understands and consents to the procedure. Patency of the right IJ vein was confirmed with ultrasound with image documentation. Noncompressible thrombus was noted in the left IJ vein.  An appropriate skin site was determined. Skin site was marked. Region was prepped using maximum barrier technique  including cap and mask, sterile gown, sterile gloves, large sterile sheet, and Chlorhexidine   as cutaneous antisepsis.  The region was infiltrated locally with 1% lidocaine. Under real-time ultrasound guidance, the right IJ vein was accessed with a 21 gauge micropuncture needle; the needle tip within the vein was confirmed with ultrasound image documentation. A 5-French cuffed power injectable dual lumen catheter was tunneled from a right anterior chest wall approach to the IJ dermatotomy site.  The needle exchanged over a guidewire for peel-away sheath through which an the 5 [place] power injectable dual lumen catheter was advanced. This was positioned with the tip at the cavoatrial junction. Spot chest radiograph shows good positioning and no pneumothorax. Catheter was flushed and sutured externally with 0-Prolene sutures. IJ dermatotomy closed with Dermabond.  Patient tolerated the procedure well, with no immediate complication.  IMPRESSION: 1. Technically successful right IJ tunneled double lumen power injectable central venous catheter placement.  Original Report Authenticated By: Thora Lance III,  M.D.   Ir US Guide Vasc Access Right  12/26/2010  *RADIOLOGY REPORT*  Clinical data: Breast cancer.  Wound infection, needs access for IV antibiotics. 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  RIGHT IJ TUNNELED CENTRAL VENOUS CATHETER PLACEMENT UNDER ULTRASOUND AND FLUOROSCOPIC GUIDANCE:  Technique and findings: The patient was premedicated for examination.  The procedure, risks (including but not limited to bleeding, infection, organ damage), benefits, and alternatives were explained to the patient.  Questions regarding the procedure were encouraged and answered.  The patient understands and consents to the procedure. Patency of the right IJ vein was confirmed with ultrasound with image documentation. Noncompressible thrombus was noted in the left IJ vein.  An appropriate  skin site was determined. Skin site was marked. Region was prepped using maximum barrier technique including cap and mask, sterile gown, sterile gloves, large sterile sheet, and Chlorhexidine   as cutaneous antisepsis.  The region was infiltrated locally with 1% lidocaine. Under real-time ultrasound guidance, the right IJ vein was accessed with a 21 gauge micropuncture needle; the needle tip within the vein was confirmed with ultrasound image documentation. A 5-French cuffed power injectable dual lumen catheter was tunneled from a right anterior chest wall approach to the IJ dermatotomy site.  The needle exchanged over a guidewire for peel-away sheath through which an the 5 [place] power injectable dual lumen catheter was advanced. This was positioned with the tip at the cavoatrial junction. Spot chest radiograph shows good positioning and no pneumothorax. Catheter was flushed and sutured externally with 0-Prolene sutures. IJ dermatotomy closed with Dermabond.  Patient tolerated the procedure well, with no immediate complication.  IMPRESSION: 1. Technically successful right IJ tunneled double lumen power injectable central venous catheter placement.  Original Report Authenticated By: Osa Craver, M.D.   Dg Chest Port 1v Same Day  12/23/2010  *RADIOLOGY REPORT*  Clinical Data: Status post attempted central line placement.  PORTABLE CHEST - 1 VIEW SAME DAY  Comparison: Chest x-ray 11/26/2010.  Findings: Port-A-Cath has been removed.  Surgical staples are seen over the right and left chest.  There is mild left basilar atelectasis.  There is no visible pneumothorax.  IMPRESSION: No visible pneumothorax.  Mild left basilar atelectasis.  Original Report Authenticated By: Elsie Stain, M.D.   BRIEF ADMITTING H & P: For complete details please refer to admission H and P, but in brief, Ms. Kobrin is an unfortunate 55 year old female, who has had breast cancer since 2011. She's undergone incomplete  chemotherapy and more recently bilateral mastectomies on November 19, 2010. She subsequently developed a staph infection and sepsis from her surgical wound site. She was discharged home in IV antibiotic - vancomycin. On the day prior to admission, she noted she could not think straight, was confused and disoriented. She checked her temperature it was 103.8. She was brought to the hospital for evaluation and ultimately referred for admission due to her sepsis coming in in a compromised status, and likely cellulitis/Port-A-Cath infection   Hospital Course:  Present on Admission:  .Infection due to central venous catheter:Patient on Ancef day #7. The plan is for the patient to have a total of 4-6 weeks of IV therapy. She has a followup appointment scheduled with Dr. Algis Liming on 02/09/2011. The patient is going home with home health care nurse and she states she is able to self administer her antibiotics.   Epistaxis (12/29/2010): The patient is also to be discharged and then developed epistaxis. She was seen in  consultation by Dr. Suszanne Conners of ENT on 12/29/2010. He placed a nasal packing into the right nostril and noted that the patient had nasal septal perforation. The patient had no further epistaxis. Dr. Suszanne Conners plans to remove the packing tomorrow if the patient is still here by noon.  Marland KitchenCARCINOMA, BREAST:The patient has followup scheduled with Dr. Cyndie Chime on 01/07/2011.  Marland KitchenDIABETES MELLITUS, TYPE ZO:[id number] carbohydrate modified diet at discharge. Her hemoglobin A1c is 6.1%. She was maintained on sliding scale insulin while in hospital.   .PERSONALITY DISORDER:The patient's mood and affect were stable. She was continued on her usual doses of Cymbalta and when necessary Klonopin.  .Chronic pain syndrome:The patient continues with her fentanyl patch and has been resumed on her oral Dilaudid.   Normocytic/acute blood loss anemia:Post transfusion hemoglobin stable.      Disposition and Follow-up:The  patient will followup with her primary care physician , surgeon, pain care specialist, Oncologist,ENT, and infectious disease specialist as specified in the discharge followup section.      Discharge Orders    Future Appointments: Provider: Department: Dept Phone: Center:   01/07/2011 4:30 PM Levert Feinstein, MD [facility] [phone] None   02/09/2011 4:00 PM Acey Lav, MD [facility] [phone] [facility]   02/23/2011 3:30 PM Rene Paci, MD [facility] [phone] [facility]     Future Orders Please Complete By Expires   Diet - low sodium heart healthy      Diet Carb Modified      Increase activity slowly      Change dressing (specify)      Comments:   Dressing change:Per wound care nurse.   Home Health      Scheduling Instructions:   Needs central line care, IV antibiotics, dressing changes/wound care.   Questions: Responses:   To provide the following care/treatments PT    OT    RN   Face-to-face encounter      Comments:   I RAMA,CHRISTINA certify that this patient is under my care and that I, or a nurse practitioner or physician's assistant working with me, had a face-to-face encounter that meets the physician face-to-face encounter requirements with this patient on 12/28/2010.       Questions: Responses:   The encounter with the patient was in whole, or in part, for the following medical condition, which is the primary reason for home health care Bacteremia, needs prolonged IV ABX   I certify that, based on my findings, the following services are medically necessary home health services Nursing    Physical therapy   My clinical findings support the need for the above services High Risk for rehospitalization   Further, I certify that my clinical findings support that this patient is homebound (i.e. absences from home require considerable and taxing effort and are for medical reasons or religious services or infrequently or of short duration when for other  reasons) Pain interferes with ambulation/mobility   To provide the following care/treatments PT    OT    RN      DISCHARGE EXAM:  Blood pressure 124/73, pulse 89, temperature 98.6 F (37 C), temperature source Oral, resp. rate 18, height 5' 5.5" (1.664 m), weight 88.905 kg (196 lb), SpO2 93.00%. General: Alert, awake, oriented x3, in no acute distress.  HEENT: [place]/AT PEERL, EOMI. right nasal packing in place.  Neck: Trachea midline, no masses, no thyromegal,y no JVD, no carotid bruit  OROPHARYNX: Moist, No exudate/ erythema/lesions.  Heart: Regular rate and rhythm, without murmurs, rubs, gallops,  PMI non-displaced, no heaves or thrills on palpation.  Lungs: Clear to auscultation, no wheezing or rhonchi noted. No increased vocal fremitus resonant to percussion  Abdomen: Soft, nontender, nondistended, positive bowel sounds, no masses no hepatosplenomegaly noted..  Neuro: No focal neurological deficits noted cranial nerves II through XII grossly intact. DTRs 2+ bilaterally upper and lower extremities. Strength 5 out of 5 in bilateral upper and lower extremities.  Musculoskeletal: Patient has erythema on the bilateral lower legs in a stocking fashion. However she has no warmth and no swelling associated with it.Marland Kitchen  Psychiatric: Patient alert and oriented x3, good insight and cognition, good recent to remote recall.  Lymph node survey: No cervical axillary or inguinal lymphadenopathy noted.       Basename 01/02/11 0500 12/31/10 0443  NA 135 136  K 3.8 3.8  CL 96 96  CO2 32 31  GLUCOSE 125* 128*  BUN 7 11  CREATININE 0.94 0.99  CALCIUM 9.4 9.3  MG -- --  PHOS -- --   No results found for this basename: AST:2,ALT:2,ALKPHOS:2,BILITOT:2,PROT:2,ALBUMIN:2 in the last 72 hours No results found for this basename: LIPASE:2,AMYLASE:2 in the last 72 hours  Basename 01/02/11 0500 01/01/11 0454  WBC 6.8 7.4  NEUTROABS -- --  HGB 10.5* 10.3*  HCT 33.7* 31.9*  MCV 83.6 83.1  PLT 328 300     Signed: Nimisha Rathel A. 01/02/2011, 9:24 PM

## 2011-01-02 NOTE — Progress Notes (Signed)
Subjective: Patient has had no further bleeding. She does complain of some nasal congestion which is expected. Objective: Filed Vitals:   01/01/11 1405 01/01/11 2104 01/02/11 0535 01/02/11 1329  BP: 148/75 118/60 114/65 124/73  Pulse: 81 88 90 89  Temp: 97.9 F (36.6 C) 98 F (36.7 C) 98.4 F (36.9 C) 98.6 F (37 C)  TempSrc: Oral Oral Oral Oral  Resp: 18 20 20 18   Height:      Weight:      SpO2: 99% 98% 92% 93%   Weight change:   Intake/Output Summary (Last 24 hours) at 01/02/11 2033 Last data filed at 01/02/11 1706  Gross per 24 hour  Intake   1622 ml  Output   5200 ml  Net  -3578 ml  Temp:  [97.5 F (36.4 C)-98.3 F (36.8 C)] 97.9 F (36.6 C) (11/24 1405) Pulse Rate:  [81-92] 81  (11/24 1405) Resp:  [15-18] 18  (11/24 1405) BP: (108-153)/(66-86) 148/75 mmHg (11/24 1405) SpO2:  [86 %-99 %] 99 % (11/24 1405)  General: Alert, awake, oriented x3, in no acute distress.  HEENT: [place]/AT PEERL, EOMI. right nasal packing in place.  Neck: Trachea midline, no masses, no thyromegal,y no JVD, no carotid bruit  OROPHARYNX: Moist, No exudate/ erythema/lesions.  Heart: Regular rate and rhythm, without murmurs, rubs, gallops, PMI non-displaced, no heaves or thrills on palpation.  Lungs: Clear to auscultation, no wheezing or rhonchi noted. No increased vocal fremitus resonant to percussion  Abdomen: Soft, nontender, nondistended, positive bowel sounds, no masses no hepatosplenomegaly noted..  Neuro: No focal neurological deficits noted cranial nerves II through XII grossly intact. DTRs 2+ bilaterally upper and lower extremities. Strength 5 out of 5 in bilateral upper and lower extremities.  Musculoskeletal: Patient has erythema on the bilateral lower legs in a stocking fashion. However she has no warmth and no swelling associated with it.Marland Kitchen  Psychiatric: Patient alert and oriented x3, good insight and cognition, good recent to remote recall.  Lymph node survey: No cervical axillary or  inguinal lymphadenopathy noted.   Lab Results:  Basename 01/02/11 0500 12/31/10 0443  NA 135 136  K 3.8 3.8  CL 96 96  CO2 32 31  GLUCOSE 125* 128*  BUN 7 11  CREATININE 0.94 0.99  CALCIUM 9.4 9.3  MG -- --  PHOS -- --   No results found for this basename: AST:2,ALT:2,ALKPHOS:2,BILITOT:2,PROT:2,ALBUMIN:2 in the last 72 hours No results found for this basename: LIPASE:2,AMYLASE:2 in the last 72 hours  Basename 01/02/11 0500 01/01/11 0454  WBC 6.8 7.4  NEUTROABS -- --  HGB 10.5* 10.3*  HCT 33.7* 31.9*  MCV 83.6 83.1  PLT 328 300   No results found for this basename: CKTOTAL:3,CKMB:3,CKMBINDEX:3,TROPONINI:3 in the last 72 hours No results found for this basename: POCBNP:3 in the last 72 hours No results found for this basename: DDIMER:2 in the last 72 hours No results found for this basename: HGBA1C:2 in the last 72 hours No results found for this basename: CHOL:2,HDL:2,LDLCALC:2,TRIG:2,CHOLHDL:2,LDLDIRECT:2 in the last 72 hours No results found for this basename: TSH,T4TOTAL,FREET3,T3FREE,THYROIDAB in the last 72 hours No results found for this basename: VITAMINB12:2,FOLATE:2,FERRITIN:2,TIBC:2,IRON:2,RETICCTPCT:2 in the last 72 hours  Micro Results: No results found for this or any previous visit (from the past 240 hour(s)).  Studies/Results: Ct Chest Wo Contrast  12/21/2010  *RADIOLOGY REPORT*  Clinical Data: Status post bilateral mastectomy with cellulitis and fever.  CT CHEST WITHOUT CONTRAST  Technique:  Multidetector CT imaging of the chest was performed following the standard  protocol without IV contrast.  Comparison: None  Findings: There are skin staples and surgical changes from bilateral mastectomies.  Bilateral interstitial changes in the subcutaneous fat suggesting cellulitis.  There is a large postoperative fluid collection on the right side measuring 12 x 4 cm.  This could be a postoperative liquefied hematoma but cannot exclude an abscess.  The Port-A-Cath is  been good position without complicating features.  The underlying bony thorax is intact.  The heart is normal in size.  No pericardial effusion.  No mediastinal or hilar lymphadenopathy.  The esophagus is grossly normal.  Examination of the lung parenchyma demonstrates no acute pulmonary findings.  No infiltrates, effusions or edema.  No worrisome pulmonary nodules or masses.  The upper abdomen is unremarkable.  The gallbladder is contracted.  IMPRESSION:  1.  Bilateral chest wall cellulitis. 2.  12 x 4 cm fluid collection in the subcutaneous fat on the right side could reflect a postoperative hematoma, seroma or abscess. 3.  No acute pulmonary findings.  Original Report Authenticated By: P. Loralie Champagne, M.D.   Ir Fluoro Guide Cv Line Right  12/26/2010  *RADIOLOGY REPORT*  Clinical data: Breast cancer.  Wound infection, needs access for IV antibiotics. 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  RIGHT IJ TUNNELED CENTRAL VENOUS CATHETER PLACEMENT UNDER ULTRASOUND AND FLUOROSCOPIC GUIDANCE:  Technique and findings: The patient was premedicated for examination.  The procedure, risks (including but not limited to bleeding, infection, organ damage), benefits, and alternatives were explained to the patient.  Questions regarding the procedure were encouraged and answered.  The patient understands and consents to the procedure. Patency of the right IJ vein was confirmed with ultrasound with image documentation. Noncompressible thrombus was noted in the left IJ vein.  An appropriate skin site was determined. Skin site was marked. Region was prepped using maximum barrier technique including cap and mask, sterile gown, sterile gloves, large sterile sheet, and Chlorhexidine   as cutaneous antisepsis.  The region was infiltrated locally with 1% lidocaine. Under real-time ultrasound guidance, the right IJ vein was accessed with a 21 gauge micropuncture needle; the needle tip within the vein  was confirmed with ultrasound image documentation. A 5-French cuffed power injectable dual lumen catheter was tunneled from a right anterior chest wall approach to the IJ dermatotomy site.  The needle exchanged over a guidewire for peel-away sheath through which an the 5 [place] power injectable dual lumen catheter was advanced. This was positioned with the tip at the cavoatrial junction. Spot chest radiograph shows good positioning and no pneumothorax. Catheter was flushed and sutured externally with 0-Prolene sutures. IJ dermatotomy closed with Dermabond.  Patient tolerated the procedure well, with no immediate complication.  IMPRESSION: 1. Technically successful right IJ tunneled double lumen power injectable central venous catheter placement.  Original Report Authenticated By: Osa Craver, M.D.   Ir US Guide Vasc Access Right  12/26/2010  *RADIOLOGY REPORT*  Clinical data: Breast cancer.  Wound infection, needs access for IV antibiotics. 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  RIGHT IJ TUNNELED CENTRAL VENOUS CATHETER PLACEMENT UNDER ULTRASOUND AND FLUOROSCOPIC GUIDANCE:  Technique and findings: The patient was premedicated for examination.  The procedure, risks (including but not limited to bleeding, infection, organ damage), benefits, and alternatives were explained to the patient.  Questions regarding the procedure were encouraged and answered.  The patient understands and consents to the procedure. Patency of the right IJ vein was confirmed with ultrasound with  image documentation. Noncompressible thrombus was noted in the left IJ vein.  An appropriate skin site was determined. Skin site was marked. Region was prepped using maximum barrier technique including cap and mask, sterile gown, sterile gloves, large sterile sheet, and Chlorhexidine   as cutaneous antisepsis.  The region was infiltrated locally with 1% lidocaine. Under real-time ultrasound guidance, the  right IJ vein was accessed with a 21 gauge micropuncture needle; the needle tip within the vein was confirmed with ultrasound image documentation. A 5-French cuffed power injectable dual lumen catheter was tunneled from a right anterior chest wall approach to the IJ dermatotomy site.  The needle exchanged over a guidewire for peel-away sheath through which an the 5 [place] power injectable dual lumen catheter was advanced. This was positioned with the tip at the cavoatrial junction. Spot chest radiograph shows good positioning and no pneumothorax. Catheter was flushed and sutured externally with 0-Prolene sutures. IJ dermatotomy closed with Dermabond.  Patient tolerated the procedure well, with no immediate complication.  IMPRESSION: 1. Technically successful right IJ tunneled double lumen power injectable central venous catheter placement.  Original Report Authenticated By: Osa Craver, M.D.   Dg Chest Port 1v Same Day  12/23/2010  *RADIOLOGY REPORT*  Clinical Data: Status post attempted central line placement.  PORTABLE CHEST - 1 VIEW SAME DAY  Comparison: Chest x-ray 11/26/2010.  Findings: Port-A-Cath has been removed.  Surgical staples are seen over the right and left chest.  There is mild left basilar atelectasis.  There is no visible pneumothorax.  IMPRESSION: No visible pneumothorax.  Mild left basilar atelectasis.  Original Report Authenticated By: Elsie Stain, M.D.    Medications: I have reviewed the patient's current medications. Scheduled Meds:    . ceFAZolin (ANCEF) IV  2 g Intravenous Q8H  . docusate sodium  100 mg Oral BID  . DULoxetine  60 mg Oral BID  . enoxaparin (LOVENOX) injection  130 mg Subcutaneous Q24H  . fentaNYL  150 mcg Transdermal Q72H  . gabapentin  900 mg Oral TID  . hydrochlorothiazide  25 mg Oral Daily  . insulin aspart  0-5 Units Subcutaneous QHS  . insulin aspart  0-9 Units Subcutaneous TID WC  . metoCLOPramide  10 mg Oral BID  . pantoprazole  40 mg  Oral Q1200  . potassium chloride SA  20 mEq Oral BID  . sodium chloride  3 mL Intravenous Q12H  . spironolactone  25 mg Oral Daily  . traZODone  150 mg Oral QHS   Continuous Infusions:    . sodium chloride    . 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 The plan is for the patient to have a total of 4-6 weeks of IV therapy. She has a followup appointment scheduled with Dr. Algis Liming on 02/09/2011. The patient is going home with home health care nurse and she states she is able to self administer her antibiotics.    Epistaxis (12/29/2010): The patient is also to be discharged and then developed epistaxis. She was seen in consultation by Dr. Suszanne Conners of ENT on 12/29/2010. He placed a nasal packing into the right nostril and noted that the patient had nasal septal perforation. The patient had no further epistaxis. Dr.  Teoh plans to remove the packing tomorrow if the patient is still here by noon.  Chronic pain syndrome (09/07/2009): The patient continues with her fentanyl patch and has been resumed on her oral Dilaudid.    DVT (deep venous thrombosis) (12/26/2010): The patient continues on full dose Lovenox    Normocytic anemia (12/27/2010):  Assessment: Post transfusion hemoglobin stable.    CARCINOMA, BREAST: The patient has followup scheduled with Dr. Cyndie Chime on 01/07/2011.  PERSONALITY DISORDER: The patient's mood and affect were stable. She was continued on her usual doses of Cymbalta and when necessary Klonopin.  DIABETES MELLITUS, TYPE II: Recommended carbohydrate modified diet at discharge. Her hemoglobin A1c is 6.1%. She was  maintained on sliding scale insulin while in hospital.     LOS: 14 days

## 2011-01-03 LAB — GLUCOSE, CAPILLARY: Glucose-Capillary: 128 mg/dL — ABNORMAL HIGH (ref 70–99)

## 2011-01-03 LAB — TYPE AND SCREEN
ABO/RH(D): A POS
Antibody Screen: NEGATIVE
Unit division: 0
Unit division: 0

## 2011-01-03 MED ORDER — ENOXAPARIN SODIUM 150 MG/ML ~~LOC~~ SOLN: 130.0000 mg | SUBCUTANEOUS | Status: AC

## 2011-01-03 MED ORDER — HEPARIN SOD (PORK) LOCK FLUSH 100 UNIT/ML IV SOLN
INTRAVENOUS | Status: AC
  Administered 2011-01-03: 12:00:00
  Filled 2011-01-03: qty 10

## 2011-01-03 NOTE — Progress Notes (Signed)
Subjective: Pt reports no more active bleeding.  She c/o nasal congestion.  No fever or facial pain. No nasal drainage.  Objective: Vital signs in last 24 hours: Temp:  [98.4 F (36.9 C)-98.9 F (37.2 C)] 98.9 F (37.2 C) (11/26 0500) Pulse Rate:  [86-89] 89  (11/26 0500) Resp:  [18-20] 20  (11/26 0500) BP: (123-136)/(73-74) 123/73 mmHg (11/26 0500) SpO2:  [91 %-93 %] 92 % (11/26 0500)  General appearance: alert, cooperative and no distress  Head: Normocephalic, without obvious abnormality, atraumatic, sinuses nontender to percussion  Eyes: conjunctivae/corneas clear. PERRL, EOM's intact. Fundi benign.  Ears: normal TM's and external ear canals both ears  Nose: Right nasal packing in place. No bleeding. 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  The nasal packing is removed without difficulty.  Blood clots are noted within the right nasal cavity. Throat: lips, mucosa, and tongue normal; teeth and gums normal and no bleeding is noted.  Neck: no adenopathy, no carotid bruit, no JVD, supple, symmetrical, trachea midline and thyroid not enlarged, symmetric, no tenderness/mass/nodules  Lymph nodes: Cervical, supraclavicular, and axillary nodes normal.  Neurologic: Grossly normal   Basename 01/02/11 0500 01/01/11 0454  WBC 6.8 7.4  HGB 10.5* 10.3*  HCT 33.7* 31.9*  PLT 328 300    Basename 01/02/11 0500  NA 135  K 3.8  CL 96  CO2 32  GLUCOSE 125*  BUN 7  CREATININE 0.94  CALCIUM 9.4   Procedure:  Flexible Fiberoptic Nasal Endoscopy Anesthesia: Topical oxymetazoline and lidocaine Indication: To evaluate the source of the patient's bleeding and the extent of her septal perforation. Description: Risks, benefits, and alternatives of flexible endoscopy were explained to the patient.  Specific mention was made of the risk of throat numbness with difficulty swallowing, possible bleeding from the nose and mouth, and pain from the procedure.  The patient gave oral consent to proceed.   The nasal cavities were decongested and anesthetised with a combination of oxymetazoline and 4% lidocaine solution.  The flexible scope was inserted into the right nasal cavity.  Endoscopy of the inferior and middle meatus was performed.  Old blood clots were noted. No polyp, mass, or lesion was appreciated.  Olfactory cleft was clear.  Nasopharynx was clear.  Turbinates were without mass. The large septal perforation was again noted.  The perforation covers 70% of the septal area. The procedure was repeated on the contralateral side with similar findings.  The patient tolerated the procedure well.  Instructions were given to avoid eating or drinking for 2 hours.    Medications:  I have reviewed the patient's current medications. Scheduled:   . ceFAZolin (ANCEF) IV  2 g Intravenous Q8H  . docusate sodium  100 mg Oral BID  . DULoxetine  60 mg Oral BID  . enoxaparin (LOVENOX) injection  130 mg Subcutaneous Q24H  . fentaNYL  150 mcg Transdermal Q72H  . gabapentin  900 mg Oral TID  . hydrochlorothiazide  25 mg Oral Daily  . insulin aspart  0-5 Units Subcutaneous QHS  . insulin aspart  0-9 Units Subcutaneous TID WC  . metoCLOPramide  10 mg Oral BID  . pantoprazole  40 mg Oral Q1200  . potassium chloride SA  20 mEq Oral BID  . sodium chloride  3 mL Intravenous Q12H  . spironolactone  25 mg Oral Daily  . traZODone  150 mg Oral QHS    Assessment/Plan: No more bleeding today. The right nasal packing is removed. A large 70% septal perforation is  noted. Pt is instructed to perform nasal saline irrigation. Avoid nasal picking.  Pt may f/u in my office prn. Contact info given to pt.    LOS: 15 days   Tyrrell Stephens,SUI W 01/03/2011, 9:41 AM

## 2011-01-03 NOTE — Progress Notes (Addendum)
Interval History: [name] Carr 55 year old female who was admitted on 12/19/2010 with fever altered mental status in the setting of being treated for a staph surgical wound infection. She had been on IV vancomycin at home when she developed a fever as high as 103.8. She was placed on broad-spectrum antibiotics. A CT scan of her chest was done on 12/21/2010 which showed bilateral chest wall cellulitis and a 12x4 centimeter fluid collection in the subcutaneous fat on the right. General surgery and the infectious disease physicians were subsequently consulted. The patient had her Port-A-Cath removed on 12/23/2010. Her hospital course has been complicated by difficulty obtaining IV access. Neither of her arms were considered good sources for IV access due to her history of bilateral mastectomy and risk of lymphedema. Her Port-A-Cath had been removed and she had active cellulitis to her chest wall preventing access through the chest wall. The critical care team attempted to put a central line into her right internal jugular vein but they were unable to thread the catheter and put a peripheral line in that became dislodged shortly afterwards. She also developed a DVT involving the left internal jugular.and is now on therapeutic dose lovenox. The patient ultimately had a right IJ tunnel catheter placed by the interventional radiologist on 12/26/2010. She was supposed to be d/c'd home on 12/29/10, but developed epistaxis that ultimately required nasal packing by ENT.  The packing has now been removed.  ROS: No specific complaints.  No nosebleeds since packing removed.   Objective: Vital signs in last 24 hours: Temp:  [98.4 F (36.9 C)-98.9 F (37.2 C)] 98.9 F (37.2 C) (11/26 0500) Pulse Rate:  [86-89] 89  (11/26 0500) Resp:  [18-20] 20  (11/26 0500) BP: (123-136)/(73-74) 123/73 mmHg (11/26 0500) SpO2:  [91 %-93 %] 92 % (11/26 0500) Weight change:  Last BM Date: 01/02/11  Intake/Output from previous  day:  Intake/Output Summary (Last 24 hours) at 01/03/11 1020 Last data filed at 01/03/11 0600  Gross per 24 hour  Intake   1380 ml  Output   4850 ml  Net  -3470 ml     Physical Exam:  Gen: No acute distress.  Cardiovascular: Heart sounds regular. No murmurs, rubs, or gallops.  Respiratory: Lungs clear to auscultation bilaterally.  Gastrointestinal: Abdomen soft, nontender, nondistended with normal active bowel sounds.  Extremities: No C/E/C.   Lab Results: Basic Metabolic Panel:  Lab 01/02/11 4540 12/31/10 0443 12/29/10 0925  NA 135 136 139  K 3.8 3.8 --  CL 96 96 99  CO2 32 31 32  GLUCOSE 125* 128* 109*  BUN 7 11 11   CREATININE 0.94 0.99 0.91  CALCIUM 9.4 9.3 9.3  MG -- -- --  PHOS -- -- --   CBC:  Lab 01/02/11 0500 01/01/11 0454 12/31/10 0443 12/30/10 0638 12/29/10 0925  WBC 6.8 7.4 11.9* 11.2* 8.4  NEUTROABS -- -- -- 7.4 5.0  HGB 10.5* 10.3* 6.3* 8.9* 8.8*  HCT 33.7* 31.9* 21.2* 29.3* 29.1*  MCV 83.6 83.1 83.8 83.5 82.9  PLT 328 300 369 354 340   CBG:  Lab 01/03/11 0800 01/02/11 2122 01/02/11 1705 01/02/11 1156 01/02/11 0722  GLUCAP 128* 99 122* 114* 143*    No results found for this or any previous visit (from the past 240 hour(s)).  Studies/Results: No results found.  Medications: Scheduled Meds:   . ceFAZolin (ANCEF) IV  2 g Intravenous Q8H  . docusate sodium  100 mg Oral BID  . DULoxetine  60 mg Oral BID  .  enoxaparin (LOVENOX) injection  130 mg Subcutaneous Q24H  . fentaNYL  150 mcg Transdermal Q72H  . gabapentin  900 mg Oral TID  . hydrochlorothiazide  25 mg Oral Daily  . insulin aspart  0-5 Units Subcutaneous QHS  . insulin aspart  0-9 Units Subcutaneous TID WC  . metoCLOPramide  10 mg Oral BID  . pantoprazole  40 mg Oral Q1200  . potassium chloride SA  20 mEq Oral BID  . sodium chloride  3 mL Intravenous Q12H  . spironolactone  25 mg Oral Daily  . traZODone  150 mg Oral QHS   Continuous Infusions:   . sodium chloride    . 0.9 %  NaCl with KCl 20 mEq / L 20 mL/hr at 01/02/11 1706   PRN Meds:.acetaminophen, acetaminophen, alum & mag hydroxide-simeth, carisoprodol, clonazePAM, HYDROmorphone, magnesium hydroxide, ondansetron (ZOFRAN) IV, ondansetron, promethazine, promethazine, sodium chloride  Assessment/Plan:  Principal Problem:  *Infection due to central venous catheter The patient was admitted and put on IV vancomycin and Zosyn. She was seen by Dr. Johna Sheriff on 12/20/2010 to evaluate her Port-A-Cath as she had culture confirmed coagulase-negative Staphylococcus in her blood, the site felt to be from an infected Port-A-Cath. She failed outpatient IV antibiotic therapy with vancomycin to salvage the port. A CT scan of her chestwas done on 12/21/2010 which showed bilateral chest wall cellulitis and a 12x4 centimeter fluid collection in the subcutaneous fat on the right. The infectious disease physicians were subsequently consulted. The patient had her Port-A-Cath removed on 12/23/2010. Her hospital course has been complicated by difficulty obtaining IV access. Neither of her arms were considered good sources for IV access due to her history of bilateral mastectomy and risk of lymphedema. Her Port-A-Cath had been removed and she had active cellulitis to her chest wall preventing access through the chest wall. The critical care team attempted to put a central line into her right internal jugular vein but they were unable to thread the catheter and put a peripheral line in that became dislodged shortly afterwards. The patient ultimately had a right IJ tunnel catheter placed by the interventional radiologist on 12/26/2010. The plan now is to send her home on 4-6 weeks of IV therapy with Ancef per infectious disease recommendations. She has followup scheduled with Dr. Daiva Eves on 02/09/11. The patient will be sent home with home health care nursing and she indicates that she is able to administer her IV doses of Ancef. Active Problems:   Epistaxis in the setting of therapeutic anticoagulation She was seen in consultation by Dr. Suszanne Conners of ENT on 12/29/2010. He placed a nasal packing into the right nostril and noted that the patient had nasal septal perforation. The patient had no further epistaxis. The nasal packing was removed 01/03/11 & she has been instructed to use saline nasal rinses post-discharge.  Neck pain Thought to be secondary to left IJ thrombosis.  DVT (deep venous thrombosis) She has been placed on therapeutic dose Lovenox. The patient indicates that she knows how to self administer Lovenox.  CARCINOMA, BREAST  The patient has followup scheduled with Dr. Cyndie Chime on 01/07/2011.  PERSONALITY DISORDER  The patient's mood and affect were stable. She was continued on her usual doses of Cymbalta and when necessary Klonopin.  Chronic pain syndrome  The patient indicates that she receives her pain medications from the [facility]. She was very medication seeking while in the hospital and very resistant to attempts to titrate down her medicines. A followup appointment has  been made at her pain clinic on 01/10/2011. She has been instructed to bring her discharge paperwork with her for confirmation of her discharge prescriptions. She has been given a prescription for 60 tablets of Dilaudid-HP, 4 mg.  Hypokalemia  Patient's electrolytes were monitored closely and her potassium was replaced.  Edema extremities  Improved with diuresis and resumption of her home doses of spironolactone and hydrochlorothiazide.  Normocytic anemia  Likely secondary to anemia of chronic disease. No further workup was undertaken. Her hemoglobin and hematocrit were stable.  DIABETES MELLITUS, TYPE II  Recommended carbohydrate modified diet at discharge. Her hemoglobin A1c is 6.1%. She was maintained on sliding scale insulin while in hospital.     LOS: 15 days   Hillery Aldo, MD Pager [phone]  01/03/2011, 10:20  AM    Time spent finalizing d/c plans = 45 minutes.  Please see Dr. Anastasio Auerbach d/c note done 11/26 for details.

## 2011-01-04 ENCOUNTER — Other Ambulatory Visit: Payer: Self-pay | Admitting: Internal Medicine

## 2011-01-07 ENCOUNTER — Ambulatory Visit: Payer: Medicare Other | Admitting: Oncology

## 2011-01-07 ENCOUNTER — Telehealth: Payer: Self-pay

## 2011-01-07 NOTE — Telephone Encounter (Signed)
Received call from pt stating that she will not make appt today with Dr Cyndie Chime d/t being sick - pt states "I have the crud," & is hoarse and coughing on the phone.  Dr Reece Agar notified.  Message forwarded to [facility], scheduler to r/s pt's appt. dph

## 2011-01-12 ENCOUNTER — Telehealth: Payer: Self-pay | Admitting: Infectious Disease

## 2011-01-12 DIAGNOSIS — T80219A Unspecified infection due to central venous catheter, initial encounter: Secondary | ICD-10-CM

## 2011-01-12 MED ORDER — LINEZOLID 600 MG PO TABS: 600.0000 mg | ORAL_TABLET | Freq: Two times a day (BID) | ORAL | Status: DC

## 2011-01-12 NOTE — Telephone Encounter (Signed)
Got call from infusion co. Pts hickman is not functioning. She has been picking at it. She also according the infusion co has hx of having been found unconscious after reportedl injecting crushed dilaudid via her portacath and other central lines. [facility] will not longer care for such IVs in her because of this. I am going to notify Dr. Carmie Kanner group as well as Dr. Cyndie Chime. I would like to change her to oral zyvox and get the hickman out

## 2011-01-12 NOTE — Telephone Encounter (Signed)
Pt called back. I will arrange to have PICC removed by IR. CCS did not insert. I did not confornt her about claims of drug abuse via the line, simply told her the line needed to come out and change to oral abx. If zyvox not covered by medicare/medicaid will  Look at another bioavailable abx.   Tamika can we get IR to remove this pICC?

## 2011-01-13 ENCOUNTER — Telehealth: Payer: Self-pay | Admitting: Infectious Disease

## 2011-01-13 ENCOUNTER — Telehealth: Payer: Self-pay | Admitting: *Deleted

## 2011-01-13 ENCOUNTER — Ambulatory Visit (HOSPITAL_COMMUNITY)
Admission: RE | Admit: 2011-01-13 | Discharge: 2011-01-13 | Disposition: A | Discharge status: Home / Self Care | Payer: Medicare Other | Source: Ambulatory Visit | Attending: Infectious Disease | Admitting: Infectious Disease

## 2011-01-13 DIAGNOSIS — T80219A Unspecified infection due to central venous catheter, initial encounter: Secondary | ICD-10-CM

## 2011-01-13 MED ORDER — CEPHALEXIN 500 MG PO CAPS: 500.0000 mg | ORAL_CAPSULE | Freq: Four times a day (QID) | ORAL | Status: DC

## 2011-01-13 NOTE — Telephone Encounter (Signed)
Radiology is calling the pt to come in & get her PICC pulled. It is infected. Wants an order to culture it. I gave her the md pager so she can ask the md

## 2011-01-14 ENCOUNTER — Other Ambulatory Visit (HOSPITAL_COMMUNITY): Payer: Self-pay | Admitting: Radiology

## 2011-01-14 DIAGNOSIS — R509 Fever, unspecified: Secondary | ICD-10-CM

## 2011-01-20 ENCOUNTER — Other Ambulatory Visit: Payer: Self-pay | Admitting: Oncology

## 2011-01-20 DIAGNOSIS — C50919 Malignant neoplasm of unspecified site of unspecified female breast: Secondary | ICD-10-CM

## 2011-01-25 ENCOUNTER — Ambulatory Visit (INDEPENDENT_AMBULATORY_CARE_PROVIDER_SITE_OTHER): Payer: Medicare Other | Admitting: General Surgery

## 2011-01-25 ENCOUNTER — Encounter (INDEPENDENT_AMBULATORY_CARE_PROVIDER_SITE_OTHER): Payer: Self-pay | Admitting: General Surgery

## 2011-01-25 VITALS — BP 144/86 | HR 64 | Temp 97.0°F | Resp 18 | Ht 64.5 in | Wt 195.4 lb

## 2011-01-25 DIAGNOSIS — C50919 Malignant neoplasm of unspecified site of unspecified female breast: Secondary | ICD-10-CM

## 2011-01-25 NOTE — Progress Notes (Signed)
Patient returns for followup of bilateral simple mastectomy with a history of stage IV breast cancer. Postoperatively she developed wound cellulitis and also staph sepsis and infection of her Port-A-Cath which had to be removed. She had fairly prolonged hospitalization on IV antibiotics on the hospitalist service. She was sent home with a PICC line on IV antibiotics but this subsequently became infected as well and was removed about 10 days ago. She is currently on oral Keflex and not having any fevers but the antibiotics are irritating her stomach. She has followup within the next week or 2 with Dr. Daiva Eves and Dr. Marlena Clipper. She states she is having quite a lot of pain in her mastectomy incisions last week.  On examination she is afebrile and does not appear ill. Both mastectomy wounds are now well-healed with no fluid or any evidence of infection or residual open wound  Assessment and plan: Stage IV breast cancer with history of liver metastasis resolved clinically after chemotherapy. Now status post bilateral mastectomy. Her wounds are well healed. She has had a lot of difficulty with line infections and is not clear that she's going to be able to have any type of central line. I certainly would not replace a Port-A-Cath. I think her wounds are healing well and I expect her pain did improve. She is given range of motion exercise instructions. She is given a prescription for mastectomy bras and prosthesis. I will see her back in 8 weeks or

## 2011-01-26 ENCOUNTER — Encounter: Payer: Self-pay | Admitting: Internal Medicine

## 2011-01-26 ENCOUNTER — Ambulatory Visit (INDEPENDENT_AMBULATORY_CARE_PROVIDER_SITE_OTHER): Payer: Medicare Other | Admitting: Internal Medicine

## 2011-01-26 ENCOUNTER — Other Ambulatory Visit (INDEPENDENT_AMBULATORY_CARE_PROVIDER_SITE_OTHER): Payer: Medicare Other

## 2011-01-26 DIAGNOSIS — C50919 Malignant neoplasm of unspecified site of unspecified female breast: Secondary | ICD-10-CM

## 2011-01-26 DIAGNOSIS — G894 Chronic pain syndrome: Secondary | ICD-10-CM

## 2011-01-26 DIAGNOSIS — R5383 Other fatigue: Secondary | ICD-10-CM

## 2011-01-26 DIAGNOSIS — I82409 Acute embolism and thrombosis of unspecified deep veins of unspecified lower extremity: Secondary | ICD-10-CM

## 2011-01-26 DIAGNOSIS — R5381 Other malaise: Secondary | ICD-10-CM

## 2011-01-26 DIAGNOSIS — E876 Hypokalemia: Secondary | ICD-10-CM

## 2011-01-26 DIAGNOSIS — G459 Transient cerebral ischemic attack, unspecified: Secondary | ICD-10-CM

## 2011-01-26 LAB — CBC WITH DIFFERENTIAL/PLATELET
Basophils Absolute: 0.1 10*3/uL (ref 0.0–0.1)
Eosinophils Absolute: 0 10*3/uL (ref 0.0–0.7)
HCT: 29.7 % — ABNORMAL LOW (ref 36.0–46.0)
Lymphs Abs: 0.8 10*3/uL (ref 0.7–4.0)
MCHC: 32.7 g/dL (ref 30.0–36.0)
Monocytes Relative: 3.2 % (ref 3.0–12.0)
Platelets: 218 10*3/uL (ref 150.0–400.0)
RDW: 17.5 % — ABNORMAL HIGH (ref 11.5–14.6)

## 2011-01-26 LAB — BASIC METABOLIC PANEL
CO2: 31 mEq/L (ref 19–32)
Calcium: 8.7 mg/dL (ref 8.4–10.5)
GFR: 51.33 mL/min — ABNORMAL LOW (ref >60.00)
Sodium: 143 mEq/L (ref 135–145)

## 2011-01-26 LAB — POTASSIUM: Potassium: 4.9 mEq/L (ref 3.5–5.1)

## 2011-01-26 NOTE — Progress Notes (Signed)
Subjective:    Patient ID: [name] Carr, female    DOB: 01-22-1956, 55 y.o.   MRN: [id number]  HPI Here for follow up - reviewed chronic medical issues:  Recent dx DVT L IJ 12/26/10- on LMWH for same since dx - related to PICC, now removed Planning 6 weeks tx, but holding tx last 3 days due to nose bleed from septal perf (eval by ENT for same, IP and OP)  R Breast cancer dx 08/2009 - hx liver mets, resolved s/p chemo (initially Cytoxan + Taxotere, then Abraxane)- Initial biopsy of her breast revealed 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 PET scan revealed liver involvement and biopsy confirmed poorly differentiated adenocarcinoma consistent with metastatic breast. Her Port-A-Cath and PICC was removed due to a staph infection.  Dr. Marlena Clipper rx'd oral Xeloda, but not tolerating med due to nausea.   Chronic pain - managed by pain clinic Dr. Nilsa Nutting - reports remotely on home self administered narcotic injections and wishes to resume same due to nausea and vomiting and difficulty keeping pills down - duragesic patchs (2-72mcg) "helps but not enough" - also on cymbalta  SLE - hx pericarditis - on chronic prednisone for same -   follows with rheum at [facility] but not seen in recent months - no fever, no new rash   Psoriasis - chronic plaque L elbow - uses steroid cream for same  Past Medical History  Diagnosis Date  . PERSONALITY DISORDER   . Chronic pain syndrome     chronic narcotics, dependancy hx - dakwa for pain mgmt  . MIGRAINE HEADACHE   . Lyme disease   . ALLERGIC RHINITIS   . ANEMIA-IRON DEFICIENCY   . HYPERLIPIDEMIA   . GERD   . DIABETES MELLITUS, TYPE II   . DEPRESSION   . SEIZURE DISORDER   . CARCINOMA, BREAST 08/2009 dx    R breast,  mets to liver - resolution s/p chemo, s/p B mastect  . SLE (systemic lupus erythematosus)     [facility] - chronic pred  . Psoriasis   . Addison's disease   . Metastases to the liver   .  Degenerative joint disease   . DDD (degenerative disc disease)     Review of Systems  Constitutional: Positive for fatigue. Negative for fever.  Gastrointestinal: Negative for blood in stool.  Neurological: Negative for dizziness.       Objective:   Physical Exam  BP 120/72  Pulse 78  Temp(Src) 98.6 F (37 C) (Oral)  Wt 197 lb (89.359 kg)  SpO2 96% Wt Readings from Last 3 Encounters:  01/26/11 197 lb (89.359 kg)  01/25/11 195 lb 6 oz (88.622 kg)  12/20/10 196 lb (88.905 kg)   Constitutional: She is obese; appears well-developed and well-nourished. No distress.  Cardiovascular: Normal rate, regular rhythm and normal heart sounds.  No murmur heard. trace pitting BLE edema. Pulmonary/Chest: Effort normal and breath sounds normal. No respiratory distress. She has no wheezes.   Abdomen: obese, soft, non tender and non distended Psychiatric: She has a normal mood and nonchalant affect. Her behavior is normal. Judgment and thought content normal.   Lab Results  Component Value Date   WBC 6.8 01/02/2011   HGB 10.5* 01/02/2011   HCT 33.7* 01/02/2011   PLT 328 01/02/2011   GLUCOSE 125* 01/02/2011   CHOL 272 08/09/2009   TRIG 109 08/08/2009   HDL 88 08/09/2009   LDLCALC 782 08/09/2009   ALT 17 11/11/2010  AST 21 11/11/2010   NA 135 01/02/2011   K 3.8 01/02/2011   CL 96 01/02/2011   CREATININE 0.94 01/02/2011   BUN 7 01/02/2011   CO2 32 01/02/2011   TSH 1.322 12/20/2010   INR 1.06 12/29/2010   HGBA1C 6.1* 12/26/2010        Assessment & Plan:  See problem list. Medications and labs reviewed today.

## 2011-01-26 NOTE — Progress Notes (Signed)
Addended by: Rene Paci A on: 01/26/2011 02:45 PM   Modules accepted: Orders

## 2011-01-26 NOTE — Patient Instructions (Signed)
It was good to see you today. Test(s) ordered today. Your results will be called to you after review (48-72hours after test completion). If any changes need to be made, you will be notified at that time. we'll make referral for neck ultrasound to follow up clot resolution - Will stop the Lovenox if clot has resolved due to your severe nose bleed complications, but off treatment, you are at risk for recurrent clots! Other Medications reviewed, no changes at this time. Please schedule followup in 4 months, call sooner if problems.

## 2011-01-26 NOTE — Assessment & Plan Note (Signed)
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  PET scan unfortunately also revealed liver involvement and biopsy confirmed poorly differentiated adenocarcinoma consistent with metastatic breast.   initially underwent chemotherapy in [place] with Cytoxan and Taxotere.  switched to a Abraxane due to poor tolerance. She had good clinical response with shrinkage of her breast mass.  Dr. Marlena Clipper rx'd oral Xeloda, but pt did not tolerating med due to nausea. 10/2010 imaging in [facility] (mammogram) shows her tumor now measures 2.9 cm down from 4.3 cm at presentation. s/p double mastectomy 11/19/10 PICC line and PortA Cath infection precluding indwelling line for IV access - tx ongoing per ID and onc

## 2011-01-26 NOTE — Assessment & Plan Note (Signed)
Narcotic dependency - follows with pain mgmt for same - dakwa No change in med mgmt as ongoing -  I declined to increase Klonopin dose as requested today

## 2011-01-26 NOTE — Assessment & Plan Note (Signed)
Related to central line mid Nov 2012 - L IJ - has been on full dose LMWH due to same since that time until last 72h Tx complicated by recurrent severe epistaxis with septal perf - reports would like to NOT resume tx for same if clot has resolved As inciting event (central line) has been removed, and complications exist (severe nosebleed), this plan is not unreasonable - Explained to pt however she is at risk for recurrent clots due to hypercoagulable state with malignancy - Pt understands and agrees to risk of same

## 2011-01-27 NOTE — Progress Notes (Signed)
Addended by: Rene Paci A on: 01/27/2011 12:10 PM   Modules accepted: Orders

## 2011-01-31 ENCOUNTER — Other Ambulatory Visit: Payer: Self-pay | Admitting: Internal Medicine

## 2011-01-31 MED ORDER — CLONAZEPAM 1 MG PO TABS: 1.0000 mg | ORAL_TABLET | Freq: Two times a day (BID) | ORAL | Status: DC | PRN

## 2011-01-31 NOTE — Telephone Encounter (Signed)
Pt informed

## 2011-01-31 NOTE — Telephone Encounter (Signed)
Patient is requesting a refill of her clonazapam be sent to her pharmacy at [facility] in [facility]. Patient will run out today.  Please Advise.

## 2011-01-31 NOTE — Telephone Encounter (Signed)
ok 

## 2011-02-04 ENCOUNTER — Telehealth: Payer: Self-pay | Admitting: *Deleted

## 2011-02-04 ENCOUNTER — Ambulatory Visit (INDEPENDENT_AMBULATORY_CARE_PROVIDER_SITE_OTHER): Payer: Medicare Other | Admitting: *Deleted

## 2011-02-04 ENCOUNTER — Ambulatory Visit (INDEPENDENT_AMBULATORY_CARE_PROVIDER_SITE_OTHER): Payer: Medicare Other | Admitting: Cardiology

## 2011-02-04 ENCOUNTER — Emergency Department (HOSPITAL_COMMUNITY): Payer: Medicare Other

## 2011-02-04 ENCOUNTER — Encounter: Payer: Self-pay | Admitting: *Deleted

## 2011-02-04 ENCOUNTER — Encounter (HOSPITAL_COMMUNITY): Payer: Self-pay | Admitting: *Deleted

## 2011-02-04 ENCOUNTER — Encounter: Payer: Self-pay | Admitting: Cardiology

## 2011-02-04 ENCOUNTER — Other Ambulatory Visit: Payer: Self-pay

## 2011-02-04 ENCOUNTER — Inpatient Hospital Stay (HOSPITAL_COMMUNITY)
Admission: EM | Admit: 2011-02-04 | Discharge: 2011-02-07 | DRG: 300 | Disposition: A | Discharge status: Home / Self Care | Payer: Medicare Other | Attending: Family Medicine | Admitting: Family Medicine

## 2011-02-04 ENCOUNTER — Encounter (INDEPENDENT_AMBULATORY_CARE_PROVIDER_SITE_OTHER): Payer: Medicare Other | Admitting: General Surgery

## 2011-02-04 DIAGNOSIS — R079 Chest pain, unspecified: Secondary | ICD-10-CM | POA: Diagnosis present

## 2011-02-04 DIAGNOSIS — Z79899 Other long term (current) drug therapy: Secondary | ICD-10-CM

## 2011-02-04 DIAGNOSIS — Z888 Allergy status to other drugs, medicaments and biological substances status: Secondary | ICD-10-CM

## 2011-02-04 DIAGNOSIS — Z86718 Personal history of other venous thrombosis and embolism: Secondary | ICD-10-CM

## 2011-02-04 DIAGNOSIS — F609 Personality disorder, unspecified: Secondary | ICD-10-CM

## 2011-02-04 DIAGNOSIS — G40909 Epilepsy, unspecified, not intractable, without status epilepticus: Secondary | ICD-10-CM | POA: Diagnosis present

## 2011-02-04 DIAGNOSIS — R600 Localized edema: Secondary | ICD-10-CM

## 2011-02-04 DIAGNOSIS — F3289 Other specified depressive episodes: Secondary | ICD-10-CM

## 2011-02-04 DIAGNOSIS — D649 Anemia, unspecified: Secondary | ICD-10-CM

## 2011-02-04 DIAGNOSIS — F1120 Opioid dependence, uncomplicated: Secondary | ICD-10-CM | POA: Diagnosis present

## 2011-02-04 DIAGNOSIS — N259 Disorder resulting from impaired renal tubular function, unspecified: Secondary | ICD-10-CM | POA: Diagnosis present

## 2011-02-04 DIAGNOSIS — D509 Iron deficiency anemia, unspecified: Secondary | ICD-10-CM

## 2011-02-04 DIAGNOSIS — R569 Unspecified convulsions: Secondary | ICD-10-CM | POA: Diagnosis present

## 2011-02-04 DIAGNOSIS — I82409 Acute embolism and thrombosis of unspecified deep veins of unspecified lower extremity: Secondary | ICD-10-CM

## 2011-02-04 DIAGNOSIS — G894 Chronic pain syndrome: Secondary | ICD-10-CM | POA: Diagnosis present

## 2011-02-04 DIAGNOSIS — J309 Allergic rhinitis, unspecified: Secondary | ICD-10-CM

## 2011-02-04 DIAGNOSIS — C50919 Malignant neoplasm of unspecified site of unspecified female breast: Secondary | ICD-10-CM

## 2011-02-04 DIAGNOSIS — Z91041 Radiographic dye allergy status: Secondary | ICD-10-CM

## 2011-02-04 DIAGNOSIS — R55 Syncope and collapse: Secondary | ICD-10-CM | POA: Diagnosis present

## 2011-02-04 DIAGNOSIS — E785 Hyperlipidemia, unspecified: Secondary | ICD-10-CM

## 2011-02-04 DIAGNOSIS — Z8614 Personal history of Methicillin resistant Staphylococcus aureus infection: Secondary | ICD-10-CM

## 2011-02-04 DIAGNOSIS — G43909 Migraine, unspecified, not intractable, without status migrainosus: Secondary | ICD-10-CM

## 2011-02-04 DIAGNOSIS — R509 Fever, unspecified: Secondary | ICD-10-CM

## 2011-02-04 DIAGNOSIS — F329 Major depressive disorder, single episode, unspecified: Secondary | ICD-10-CM

## 2011-02-04 DIAGNOSIS — C787 Secondary malignant neoplasm of liver and intrahepatic bile duct: Secondary | ICD-10-CM | POA: Diagnosis present

## 2011-02-04 DIAGNOSIS — IMO0002 Reserved for concepts with insufficient information to code with codable children: Secondary | ICD-10-CM | POA: Diagnosis present

## 2011-02-04 DIAGNOSIS — B370 Candidal stomatitis: Secondary | ICD-10-CM | POA: Diagnosis present

## 2011-02-04 DIAGNOSIS — R110 Nausea: Secondary | ICD-10-CM

## 2011-02-04 DIAGNOSIS — D849 Immunodeficiency, unspecified: Secondary | ICD-10-CM

## 2011-02-04 DIAGNOSIS — Z6833 Body mass index (BMI) 33.0-33.9, adult: Secondary | ICD-10-CM

## 2011-02-04 DIAGNOSIS — N289 Disorder of kidney and ureter, unspecified: Secondary | ICD-10-CM | POA: Diagnosis present

## 2011-02-04 DIAGNOSIS — M329 Systemic lupus erythematosus, unspecified: Secondary | ICD-10-CM

## 2011-02-04 DIAGNOSIS — R0602 Shortness of breath: Secondary | ICD-10-CM

## 2011-02-04 DIAGNOSIS — Z794 Long term (current) use of insulin: Secondary | ICD-10-CM

## 2011-02-04 DIAGNOSIS — Z87892 Personal history of anaphylaxis: Secondary | ICD-10-CM

## 2011-02-04 DIAGNOSIS — K219 Gastro-esophageal reflux disease without esophagitis: Secondary | ICD-10-CM

## 2011-02-04 DIAGNOSIS — E876 Hypokalemia: Secondary | ICD-10-CM | POA: Diagnosis present

## 2011-02-04 DIAGNOSIS — I82C19 Acute embolism and thrombosis of unspecified internal jugular vein: Secondary | ICD-10-CM | POA: Diagnosis present

## 2011-02-04 DIAGNOSIS — E119 Type 2 diabetes mellitus without complications: Secondary | ICD-10-CM | POA: Diagnosis present

## 2011-02-04 HISTORY — DX: Acute embolism and thrombosis of unspecified vein: I82.90

## 2011-02-04 HISTORY — DX: Vascular complications following infusion, transfusion and therapeutic injection, initial encounter: T80.1XXA

## 2011-02-04 HISTORY — DX: Phlebitis and thrombophlebitis of unspecified site: I80.9

## 2011-02-04 HISTORY — DX: Hypothyroidism, unspecified: E03.9

## 2011-02-04 HISTORY — DX: Polyneuropathy, unspecified: G62.9

## 2011-02-04 HISTORY — DX: Anxiety disorder, unspecified: F41.9

## 2011-02-04 LAB — BASIC METABOLIC PANEL
BUN: 15 mg/dL (ref 6–23)
CO2: 31 mEq/L (ref 19–32)
Calcium: 9.7 mg/dL (ref 8.4–10.5)
Chloride: 92 mEq/L — ABNORMAL LOW (ref 96–112)
Creatinine, Ser: 1.02 mg/dL (ref 0.50–1.10)
GFR calc Af Amer: 70 mL/min — ABNORMAL LOW (ref >90)
GFR calc non Af Amer: 60 mL/min — ABNORMAL LOW (ref >90)
Glucose, Bld: 136 mg/dL — ABNORMAL HIGH (ref 70–99)
Potassium: 3.4 mEq/L — ABNORMAL LOW (ref 3.5–5.1)
Sodium: 135 mEq/L (ref 135–145)

## 2011-02-04 LAB — CBC
HCT: 31.3 % — ABNORMAL LOW (ref 36.0–46.0)
Hemoglobin: 9.7 g/dL — ABNORMAL LOW (ref 12.0–15.0)
MCH: 24.7 pg — ABNORMAL LOW (ref 26.0–34.0)
MCHC: 31 g/dL (ref 30.0–36.0)
MCV: 79.8 fL (ref 78.0–100.0)
Platelets: 269 10*3/uL (ref 150–400)
RBC: 3.92 MIL/uL (ref 3.87–5.11)
RDW: 15.5 % (ref 11.5–15.5)
WBC: 11 10*3/uL — ABNORMAL HIGH (ref 4.0–10.5)

## 2011-02-04 LAB — POCT I-STAT TROPONIN I: Troponin i, poc: 0 ng/mL (ref 0.00–0.08)

## 2011-02-04 LAB — PROTIME-INR
INR: 1.02 (ref 0.00–1.49)
Prothrombin Time: 13.6 seconds (ref 11.6–15.2)

## 2011-02-04 LAB — APTT: aPTT: 26 seconds (ref 24–37)

## 2011-02-04 MED ORDER — ONDANSETRON 4 MG PO TBDP
ORAL_TABLET | ORAL | Status: AC
  Filled 2011-02-04: qty 2

## 2011-02-04 MED ORDER — SODIUM CHLORIDE 0.9 % IV BOLUS (SEPSIS)
1000.0000 mL | Freq: Once | INTRAVENOUS | Status: AC
  Administered 2011-02-04: 1000 mL via INTRAVENOUS

## 2011-02-04 MED ORDER — PROMETHAZINE HCL 25 MG/ML IJ SOLN
25.0000 mg | INTRAMUSCULAR | Status: AC
  Administered 2011-02-04: 25 mg via INTRAVENOUS
  Filled 2011-02-04: qty 1

## 2011-02-04 MED ORDER — HYDROCHLOROTHIAZIDE 25 MG PO TABS
25.0000 mg | ORAL_TABLET | Freq: Two times a day (BID) | ORAL | Status: DC
  Administered 2011-02-04 – 2011-02-06 (×4): 25 mg via ORAL
  Filled 2011-02-04 (×5): qty 1

## 2011-02-04 MED ORDER — SODIUM CHLORIDE 0.9 % IJ SOLN
3.0000 mL | Freq: Two times a day (BID) | INTRAMUSCULAR | Status: DC
  Administered 2011-02-04 – 2011-02-07 (×2): 3 mL via INTRAVENOUS

## 2011-02-04 MED ORDER — TECHNETIUM TO 99M ALBUMIN AGGREGATED
6.0000 | Freq: Once | INTRAVENOUS | Status: AC | PRN
  Administered 2011-02-04: 6 via INTRAVENOUS

## 2011-02-04 MED ORDER — POTASSIUM CHLORIDE CRYS ER 20 MEQ PO TBCR
20.0000 meq | EXTENDED_RELEASE_TABLET | Freq: Two times a day (BID) | ORAL | Status: DC
  Administered 2011-02-04 – 2011-02-07 (×6): 20 meq via ORAL
  Filled 2011-02-04 (×8): qty 1

## 2011-02-04 MED ORDER — XENON XE 133 GAS
10.0000 | GAS_FOR_INHALATION | Freq: Once | RESPIRATORY_TRACT | Status: AC | PRN
  Administered 2011-02-04: 10 via RESPIRATORY_TRACT

## 2011-02-04 MED ORDER — SPIRONOLACTONE 25 MG PO TABS
25.0000 mg | ORAL_TABLET | Freq: Two times a day (BID) | ORAL | Status: DC
  Administered 2011-02-05 – 2011-02-07 (×5): 25 mg via ORAL
  Filled 2011-02-04 (×7): qty 1

## 2011-02-04 MED ORDER — PROBIOTIC FORMULA PO CAPS: 1.0000 | ORAL_CAPSULE | Freq: Every day | ORAL | Status: DC

## 2011-02-04 MED ORDER — DOCUSATE SODIUM 100 MG PO CAPS
200.0000 mg | ORAL_CAPSULE | Freq: Two times a day (BID) | ORAL | Status: DC
  Administered 2011-02-04 – 2011-02-07 (×6): 200 mg via ORAL
  Filled 2011-02-04 (×7): qty 2

## 2011-02-04 MED ORDER — ENOXAPARIN SODIUM 40 MG/0.4ML ~~LOC~~ SOLN
40.0000 mg | Freq: Every day | SUBCUTANEOUS | Status: DC
  Administered 2011-02-04 – 2011-02-06 (×3): 40 mg via SUBCUTANEOUS
  Filled 2011-02-04 (×4): qty 0.4

## 2011-02-04 MED ORDER — SACCHAROMYCES BOULARDII 250 MG PO CAPS
250.0000 mg | ORAL_CAPSULE | Freq: Every day | ORAL | Status: DC
  Administered 2011-02-04 – 2011-02-07 (×4): 250 mg via ORAL
  Filled 2011-02-04 (×4): qty 1

## 2011-02-04 MED ORDER — VANCOMYCIN HCL IN DEXTROSE 1-5 GM/200ML-% IV SOLN
1000.0000 mg | Freq: Two times a day (BID) | INTRAVENOUS | Status: DC
  Administered 2011-02-04 – 2011-02-07 (×6): 1000 mg via INTRAVENOUS
  Filled 2011-02-04 (×7): qty 200

## 2011-02-04 MED ORDER — FENTANYL 50 MCG/HR TD PT72
150.0000 ug | MEDICATED_PATCH | TRANSDERMAL | Status: DC
  Administered 2011-02-04: 150 ug via TRANSDERMAL
  Filled 2011-02-04: qty 2
  Filled 2011-02-04: qty 1

## 2011-02-04 MED ORDER — CARISOPRODOL 350 MG PO TABS: 350.0000 mg | ORAL_TABLET | Freq: Four times a day (QID) | ORAL | Status: DC | PRN

## 2011-02-04 MED ORDER — PROMETHAZINE HCL 25 MG/ML IJ SOLN
25.0000 mg | Freq: Once | INTRAMUSCULAR | Status: AC
  Administered 2011-02-04: 25 mg via INTRAMUSCULAR

## 2011-02-04 MED ORDER — HYDROMORPHONE HCL PF 1 MG/ML IJ SOLN
2.0000 mg | INTRAMUSCULAR | Status: DC | PRN
  Administered 2011-02-04 – 2011-02-05 (×6): 2 mg via INTRAVENOUS
  Filled 2011-02-04 (×6): qty 2

## 2011-02-04 MED ORDER — DULOXETINE HCL 60 MG PO CPEP
60.0000 mg | ORAL_CAPSULE | Freq: Two times a day (BID) | ORAL | Status: DC
  Administered 2011-02-04 – 2011-02-07 (×6): 60 mg via ORAL
  Filled 2011-02-04 (×7): qty 1

## 2011-02-04 MED ORDER — PANTOPRAZOLE SODIUM 40 MG PO TBEC
40.0000 mg | DELAYED_RELEASE_TABLET | Freq: Every day | ORAL | Status: DC
  Administered 2011-02-04 – 2011-02-07 (×4): 40 mg via ORAL
  Filled 2011-02-04 (×4): qty 1

## 2011-02-04 MED ORDER — HYDROMORPHONE HCL PF 2 MG/ML IJ SOLN
2.0000 mg | Freq: Once | INTRAMUSCULAR | Status: AC
  Administered 2011-02-04: 2 mg via INTRAVENOUS
  Filled 2011-02-04: qty 1

## 2011-02-04 MED ORDER — VANCOMYCIN HCL IN DEXTROSE 1-5 GM/200ML-% IV SOLN
1000.0000 mg | Freq: Once | INTRAVENOUS | Status: AC
  Administered 2011-02-04: 1000 mg via INTRAVENOUS
  Filled 2011-02-04: qty 200

## 2011-02-04 MED ORDER — CLONAZEPAM 1 MG PO TABS
1.0000 mg | ORAL_TABLET | Freq: Two times a day (BID) | ORAL | Status: DC | PRN
  Administered 2011-02-05: 1 mg via ORAL
  Filled 2011-02-04: qty 1

## 2011-02-04 MED ORDER — SODIUM CHLORIDE 0.9 % IJ SOLN: 3.0000 mL | INTRAMUSCULAR | Status: DC | PRN

## 2011-02-04 MED ORDER — GABAPENTIN 300 MG PO CAPS
900.0000 mg | ORAL_CAPSULE | Freq: Three times a day (TID) | ORAL | Status: DC
  Administered 2011-02-04 – 2011-02-07 (×8): 900 mg via ORAL
  Filled 2011-02-04 (×10): qty 3

## 2011-02-04 MED ORDER — SODIUM CHLORIDE 0.9 % IV SOLN
250.0000 mL | INTRAVENOUS | Status: DC | PRN
  Administered 2011-02-05: 250 mL via INTRAVENOUS

## 2011-02-04 MED ORDER — HYDROMORPHONE HCL PF 2 MG/ML IJ SOLN
2.0000 mg | Freq: Once | INTRAMUSCULAR | Status: AC
  Administered 2011-02-04: 2 mg via INTRAMUSCULAR
  Filled 2011-02-04: qty 1

## 2011-02-04 MED ORDER — SPIRONOLACTONE-HCTZ 25-25 MG PO TABS: 1.0000 | ORAL_TABLET | Freq: Two times a day (BID) | ORAL | Status: DC

## 2011-02-04 NOTE — Assessment & Plan Note (Signed)
I do note the patient stopped prematurely her antibiotics. She has been followed by the infectious disease service. She will certainly need blood cultures and probably further therapy

## 2011-02-04 NOTE — Progress Notes (Signed)
Called to ER, Pod A, Room 10 to start iv for pt; pt has history of having Hickman catheters, as well as multiple picc lines for access; had bilateral mastectomy 2 months ago, but according to pt, "didn't involve lymph nodes;"  Pt with very poor peripheral access; att x 1 without success; suggest another central line for pt if admitted; please advise;  Thank you.

## 2011-02-04 NOTE — ED Notes (Signed)
[id number] READY

## 2011-02-04 NOTE — Assessment & Plan Note (Signed)
I suspect that this was a vagal episode related to all of her other medical issues. I don't see any acute cardiac issues. However, she needs to be evaluated for fever and chest pain. I discussed her case with Rene Paci, MD.  She suggested that the patient be referred to the emergency room for admission on the medical service. I have discussed her case with Dr. Oletta Lamas.

## 2011-02-04 NOTE — ED Provider Notes (Signed)
History      55yF referred from cardiologist's office after syncopal event. Pt was leaving after getting carotid US and felt like vision was getting black. "went limp." Apparently had brief LOC. Currently c/o CP which she says is similar to previous PE although such hx is not mentioned in PMHx nor did I see it mentioned in previous noted. No sezure activity reported. Pt with complicated PMHx. Of note, most recently admitted middle of November for infected breast wound and chest wall cellulitis. Hx of breast CA s/p mastectomy. Had port a cath that was removed because of concern for infection. Had central line in IJ and subsequently developed thromobosis of R IJ. Was supposed to be taking lovenox but apparently was stopped because of recurring nose bleeds that needs cautery.Per pt, is not taking any anticoagulation. Pt bacteremic during hospitalization and DC'd with PICC. Became infiltrated and pulled and transitioned to PO abx. Pt decided to stop taking abx because was causing GI upset but did not report this to health care providers.  CSN: [id number]  Arrival date & time 02/04/11  1427   First MD Initiated Contact with Patient 02/04/11 1449      Chief Complaint  Patient presents with  . Loss of Consciousness    carotid embolism left  . Chest Pain    (Consider location/radiation/quality/duration/timing/severity/associated sxs/prior treatment) HPI  Past Medical History  Diagnosis Date  . PERSONALITY DISORDER   . Chronic pain syndrome     chronic narcotics, dependancy hx - dakwa for pain mgmt  . MIGRAINE HEADACHE   . Lyme disease   . ALLERGIC RHINITIS   . ANEMIA-IRON DEFICIENCY   . HYPERLIPIDEMIA   . GERD   . DIABETES MELLITUS, TYPE II   . DEPRESSION   . SEIZURE DISORDER   . CARCINOMA, BREAST 08/2009 dx    R breast,  mets to liver - resolution s/p chemo, s/p B mastect  . SLE (systemic lupus erythematosus)     [facility] - chronic pred  . Psoriasis   . Addison's  disease   . Metastases to the liver   . Degenerative joint disease   . DDD (degenerative disc disease)   . Breast cancer     bilateral mastectomy and has mets to liver    Past Surgical History  Procedure Date  . Breast surgery     breast biopsy  . Liver bopsy   . Port a cath placement   . Port-a-cath removal   . Mastectomy 11/19/10    bilateral mastectomy   . Port-a-cath removal 12/23/2010    Procedure: REMOVAL PORT-A-CATH;  Surgeon: Valarie Merino, MD;  Location: [facility]S;  Service: General;  Laterality: Left;    Family History  Problem Relation Age of Onset  . Lung cancer Mother   . Lung cancer Father   . Arthritis Other   . Diabetes Other   . Hyperlipidemia Other     History  Substance Use Topics  . Smoking status: Never Smoker   . Smokeless tobacco: Never Used  . Alcohol Use: No    OB History    Grav Para Term Preterm Abortions TAB SAB Ect Mult Living                  Review of Systems   Review of symptoms negative unless otherwise noted in HPI.   Allergies  Versed; Ammonia; Contrast media; Infliximab; Iohexol; Ketorolac tromethamine; Methotrexate; Nalbuphine; Nsaids; and Celecoxib  Home Medications   Current Outpatient  Rx  Name Route Sig Dispense Refill  . CARISOPRODOL 350 MG PO TABS  TAKE 1 TABLET BY MOUTH FOUR TIMES DAILY AS NEEDED 120 tablet 0  . CLOBETASOL PROPIONATE 0.05 % EX OINT Topical Apply topically 2 (two) times daily. 30 g 0  . CLONAZEPAM 1 MG PO TABS Oral Take 1 tablet (1 mg total) by mouth 2 (two) times daily as needed. 60 tablet 5  . DOCUSATE SODIUM 100 MG PO CAPS Oral Take 100 mg by mouth 2 (two) times daily.      . DULOXETINE HCL 60 MG PO CPEP Oral Take 1 capsule (60 mg total) by mouth 2 (two) times daily. 60 capsule 6  . EPINEPHRINE 0.3 MG/0.3ML IJ DEVI Intramuscular Inject 0.3 mLs (0.3 mg total) into the muscle once. 1 Device 1  . FENTANYL 75 MCG/HR TD PT72 Transdermal Place 2 patches onto the skin every 3 (three) days.     .  FUROSEMIDE 20 MG PO TABS Oral Take 1 tablet (20 mg total) by mouth daily. 30 tablet 3  . GABAPENTIN 300 MG PO CAPS Oral Take 3 capsules (900 mg total) by mouth 3 (three) times daily. 270 capsule 3  . HYDROCHLOROTHIAZIDE 25 MG PO TABS  TAKE 1 TABLET BY MOUTH EVERY DAY 30 tablet 5  . HYDROMORPHONE HCL 2 MG PO TABS Oral Take 2 tablets (4 mg total) by mouth every 4 (four) hours as needed. 60 tablet 0  . OMEPRAZOLE 40 MG PO CPDR Oral Take 40 mg by mouth every morning.      Marland Kitchen POTASSIUM CHLORIDE CRYS CR 20 MEQ PO TBCR Oral Take 1 tablet (20 mEq total) by mouth 2 (two) times daily. Or as directed 60 tablet 3  . PROBIOTIC FORMULA PO CAPS Oral Take by mouth daily.      Marland Kitchen SPIRONOLACTONE-HCTZ 25-25 MG PO TABS Oral Take 1 tablet by mouth 2 (two) times daily. 60 tablet 5    BP 146/56  Pulse 83  Temp(Src) 98.3 F (36.8 C) (Oral)  Resp 20  SpO2 97%  Physical Exam  Nursing note and vitals reviewed. Constitutional: She appears well-developed and well-nourished. No distress.       obese  HENT:  Head: Normocephalic and atraumatic.  Eyes: Conjunctivae are normal. Pupils are equal, round, and reactive to light. Right eye exhibits no discharge. Left eye exhibits no discharge.  Neck: Neck supple.  Cardiovascular: Normal rate, regular rhythm and normal heart sounds.  Exam reveals no gallop and no friction rub.   No murmur heard. Pulmonary/Chest: Effort normal and breath sounds normal. No respiratory distress.  Abdominal: Soft. She exhibits no distension. There is no tenderness.  Musculoskeletal: She exhibits no tenderness.       Mild symmetric b/l LE edema  Neurological: She is alert. No cranial nerve deficit. She exhibits normal muscle tone. Coordination normal.  Skin: Skin is warm and dry.       Chest wounds appear to be healing well. Multiple scars on b/l forearms.  Psychiatric: She has a normal mood and affect. Her behavior is normal. Thought content normal.    ED Course  Procedures (including  critical care time)  Labs Reviewed  CBC - Abnormal; Notable for the following:    WBC 11.0 (*)    Hemoglobin 9.7 (*)    HCT 31.3 (*)    MCH 24.7 (*)    All other components within normal limits  BASIC METABOLIC PANEL - Abnormal; Notable for the following:    Potassium 3.4 (*)  Chloride 92 (*)    Glucose, Bld 136 (*)    GFR calc non Af Amer 60 (*)    GFR calc Af Amer 70 (*)    All other components within normal limits  POCT I-STAT TROPONIN I  I-STAT TROPONIN I  APTT  PROTIME-INR  CULTURE, BLOOD (ROUTINE X 2)  CULTURE, BLOOD (ROUTINE X 2)   No results found.  EKG:  Rhythm: normal sinus rhythm Rate:  88 Axis: normal Intervals: minimally prolonged QTc at 494. LAE. Small qwave in III. ST segments: mild anterolateral ST depression, this is not significantly changes from previous though.  5:22 PM Pt requesting multiple large doses of narcotic pain medication and feels that currently ignoring her pain complaints. Pt is followed by pain management. Explained to pt that not familiar with her and not comfortable given requested amount of dilaudid at this time but are addressing her pain concerns. Meds given and will continue to monitor response.  1. Chest pain   2. Internal jugular vein thrombosis       MDM  55yF with syncopal event. Consider PE. Pt with known thrombosis, although likely related to line placement. Not currently anticoagulated. Pt is at increased risk for thrombosis with hx of CA. VQ scan given allergy to IV contrast. EKG with ischemic findings but these do not appear to be acute. Trop WNL. Neuro exam is nonfocal. Hx of recent bacteremia and noncompliance with abx. Pt was tx'd for several week prior to stopping meds and suspect that is probably fine with regards to this. Given syncope though and mild leukocytosis will repeat blood cultures and tx empirically. Pt is medically complex and multiple ongoing issues that need to be addressed in relatively urgent manner. Do  not feel appropriate for DC at this time but that issues cannot be adequately addressed in reasonable time frame in ED.  Discussed with hospitalist who agreeable for admission.           Raeford Razor, MD 02/04/11 [phone]

## 2011-02-04 NOTE — Telephone Encounter (Signed)
Code blue called, pt in treadmill waiting room, was scheduled for carotid duplex.  Team responded pt was awake and sitting in chair, pt was alert and oriented. Pt c/o of dizziness,   It was stated that she "went limp". bp 113/65, P 84,  spo2 97% on room air/ c/o slight sob and placed on o2 @2  l/[place]. RBS 128, t 97.4, pt was taken to room/ EKG performed had c/o cp/ described to be the same she felt when she was DX with PE. Pt was on lovenox and stated she was having daily nose bleeds and having to be cauterized daily, she stated her doctors couldn't decide if she should go off lovenox  And so she stopped on her own. Dr Antoine Poche to assess.

## 2011-02-04 NOTE — ED Notes (Signed)
Pt was having carotid ultrasound done for a known embolism????? Pt had syncopal event and then had reproducible chest pain.  Pt sts pain increases with inspiration and sent her to r/o PE.    SR 85 on monitor per ems and vss.  Pt has dizziness with standing

## 2011-02-04 NOTE — Telephone Encounter (Signed)
Bp 114/64, p 84, spo2 99% on 2 L o2, cp 9/10 with inspiration and expiration.  Family member in room with her.

## 2011-02-04 NOTE — H&P (Signed)
PCP:   Rene Paci, MD, MD   Chief Complaint: Syncopal episode.   HPI: [name] Carr is an 55 y.o. female with multiple medical problems including breast cancer, status post bilateral mastectomy, recent left internal jugular thrombosis (about a month ago), recurrent nose bleed requiring cauterization and discontinuation of anticoagulation., Chronic pain syndrome and narcotic dependency, history of seizure disorder, diabetes, GERD, sent in from Dr. Areatha Keas office for sudden episode of loss of consciousness. She was having carotid Doppler done and had a "blackout". She denied any substernal chest pain, nausea, vomiting, diaphoresis, shortness of breath, bowel bladder incontinence, or any seizure activity. Evaluation in emergency room included a mild anemia with hemoglobin of 9.7, normal white count, negative cardiac markers, and a negative ventilation perfusion scan. She requests pain medication several times. She also has history of MRSA line infection and has been on antibiotic, however being noncompliant, she has stopped medications.  It should be noted that her hematologist/oncologist Dr. Marlena Clipper has tried Xarelto, but she was not able take it due to nausea.  Rewiew of Systems:  The patient denies anorexia, fever, weight loss,, vision loss, decreased hearing, hoarseness, chest pain,  dyspnea on exertion, peripheral edema, balance deficits, hemoptysis, abdominal pain, melena, hematochezia, severe indigestion/heartburn, hematuria, incontinence, genital sores, muscle weakness, suspicious skin lesions, transient blindness, difficulty walking, depression, unusual weight change, abnormal bleeding, enlarged lymph nodes, angioedema, and breast masses.   Past Medical History  Diagnosis Date  . PERSONALITY DISORDER   . Chronic pain syndrome     chronic narcotics, dependancy hx - [place] for pain mgmt  . MIGRAINE HEADACHE   . Lyme disease   . ALLERGIC RHINITIS   . ANEMIA-IRON DEFICIENCY   .  HYPERLIPIDEMIA   . GERD   . DIABETES MELLITUS, TYPE II   . DEPRESSION   . SEIZURE DISORDER   . CARCINOMA, BREAST 08/2009 dx    R breast,  mets to liver - resolution s/p chemo, s/p B mastect  . SLE (systemic lupus erythematosus)     [place] ([facility] - chronic pred  . Psoriasis   . Addison's disease   . Metastases to the liver   . Degenerative joint disease   . DDD (degenerative disc disease)   . Breast cancer     bilateral mastectomy and has mets to liver    Past Surgical History  Procedure Date  . Breast surgery     breast biopsy  . Liver bopsy   . Port a cath placement   . Port-a-cath removal   . Mastectomy 11/19/10    bilateral mastectomy   . Port-a-cath removal 12/23/2010    Procedure: REMOVAL PORT-A-CATH;  Surgeon: Valarie Merino, MD;  Location: [facility];  Service: General;  Laterality: Left;    Medications:  HOME MEDS: Prior to Admission medications   Medication Sig Start Date End Date Taking? Authorizing Provider  carisoprodol (SOMA) 350 MG tablet Take 350 mg by mouth 4 (four) times daily as needed. For pain.    Yes Historical Provider, MD  clonazePAM (KLONOPIN) 1 MG tablet Take 1 mg by mouth 2 (two) times daily as needed. For anxiety  01/31/11  Yes Rene Paci, MD  docusate sodium (COLACE) 100 MG capsule Take 200 mg by mouth 2 (two) times daily.    Yes Historical Provider, MD  DULoxetine (CYMBALTA) 60 MG capsule Take 1 capsule (60 mg total) by mouth 2 (two) times daily. 10/20/10  Yes Rene Paci, MD  furosemide (LASIX) 20 MG tablet Take  1 tablet (20 mg total) by mouth daily. 10/20/10 10/20/11 Yes Rene Paci, MD  gabapentin (NEURONTIN) 300 MG capsule Take 3 capsules (900 mg total) by mouth 3 (three) times daily. 09/13/10  Yes Rene Paci, MD  hydrochlorothiazide (HYDRODIURIL) 25 MG tablet Take 25 mg by mouth daily.     Yes Historical Provider, MD  HYDROmorphone (DILAUDID) 2 MG tablet Take 4 mg by mouth every 4 (four) hours as needed. For pain.   12/28/10  Yes Christina Rama  omeprazole (PRILOSEC) 40 MG capsule Take 40 mg by mouth every morning.     Yes Historical Provider, MD  potassium chloride SA (K-DUR,KLOR-CON) 20 MEQ tablet Take 20 mEq by mouth 2 (two) times daily. Or as directed  10/01/10  Yes Rene Paci, MD  Probiotic Product (PROBIOTIC FORMULA) CAPS Take 1 capsule by mouth daily.    Yes Historical Provider, MD  spironolactone-hydrochlorothiazide (ALDACTAZIDE) 25-25 MG per tablet Take 1 tablet by mouth 2 (two) times daily.   11/02/10  Yes Rene Paci, MD  EPINEPHrine (EPI-PEN) 0.3 mg/0.3 mL DEVI Inject 0.3 mg into the muscle once as needed. For bee stings.  07/14/10   Rene Paci, MD  fentaNYL (DURAGESIC - DOSED MCG/HR) 75 MCG/HR Place 2 patches onto the skin every 3 (three) days.     Historical Provider, MD     Allergies:  Allergies  Allergen Reactions  . Methotrexate Anaphylaxis  . Versed Anaphylaxis  . Ammonia Other (See Comments)    Ended up on ventilator from ammonia tabs  . Contrast Media (Iodinated Diagnostic Agents) Other (See Comments)    Doesn't breath well.  . Infliximab Nausea And Vomiting  . Iohexol Other (See Comments)    SOB   . Keflex Nausea And Vomiting and Other (See Comments)    Blisters.  . Ketorolac Tromethamine Other (See Comments)    Injectable doesn't work and pill hurts stomach.  . Nalbuphine Other (See Comments)    Unknown.  . Nsaids Nausea And Vomiting  . Celecoxib Rash    Social History:   reports that she has never smoked. She has never used smokeless tobacco. She reports that she does not drink alcohol or use illicit drugs.  Family History: Family History  Problem Relation Age of Onset  . Lung cancer Mother   . Lung cancer Father   . Arthritis Other   . Diabetes Other   . Hyperlipidemia Other      Physical Exam: Filed Vitals:   02/04/11 1431 02/04/11 1845 02/04/11 2100 02/04/11 2202  BP: 146/56 102/48  132/82  Pulse: 83 79  80  Temp: 98.3 F (36.8 C)   98  F (36.7 C)  TempSrc: Oral   Oral  Resp: 20   20  Height:   5\' 5"  (1.651 m) 5\' 5"  (1.651 m)  Weight:   87.998 kg (194 lb) 89.9 kg (198 lb 3.1 oz)  SpO2: 97% 99%  100%   Blood pressure 132/82, pulse 80, temperature 98 F (36.7 C), temperature source Oral, resp. rate 20, height 5\' 5"  (1.651 m), weight 89.9 kg (198 lb 3.1 oz), SpO2 100.00%.  GEN:  Pleasant person lying in the stretcher in no acute distress; cooperative with exam PSYCH:  alert and oriented x4; does not appear anxious does not appear depressed; affect is normal HEENT: Mucous membranes pink and anicteric; PERRLA; EOM intact; no cervical lymphadenopathy nor thyromegaly or carotid bruit; no JVD; Breasts:: Not examined, she is status post bilateral mastectomies CHEST WALL: No tenderness CHEST: Normal respiration,  clear to auscultation bilaterally HEART: Regular rate and rhythm; no murmurs rubs or gallops BACK: No kyphosis or scoliosis; no CVA tenderness ABDOMEN: Obese, soft non-tender; no masses, no organomegaly, normal abdominal bowel sounds; no pannus; no intertriginous candida. Rectal Exam: Not done EXTREMITIES: No bone or joint deformity; age-appropriate arthropathy of the hands and knees; no edema; no ulcerations. Genitalia: not examined PULSES: 2+ and symmetric SKIN: Normal hydration no rash or ulceration CNS: Cranial nerves 2-12 grossly intact no focal neurologic deficit   Labs & Imaging Results for orders placed during the hospital encounter of 02/04/11 (from the past 48 hour(s))  CBC     Status: Abnormal   Collection Time   02/04/11  3:22 PM      Component Value Range Comment   WBC 11.0 (*) 4.0 - 10.5 (K/uL)    RBC 3.92  3.87 - 5.11 (MIL/uL)    Hemoglobin 9.7 (*) 12.0 - 15.0 (g/dL)    HCT 16.1 (*) 09.6 - 46.0 (%)    MCV 79.8  78.0 - 100.0 (fL)    MCH 24.7 (*) 26.0 - 34.0 (pg)    MCHC 31.0  30.0 - 36.0 (g/dL)    RDW 04.5  40.9 - 81.1 (%)    Platelets 269  150 - 400 (K/uL)   BASIC METABOLIC PANEL     Status:  Abnormal   Collection Time   02/04/11  3:22 PM      Component Value Range Comment   Sodium 135  135 - 145 (mEq/L)    Potassium 3.4 (*) 3.5 - 5.1 (mEq/L)    Chloride 92 (*) 96 - 112 (mEq/L)    CO2 31  19 - 32 (mEq/L)    Glucose, Bld 136 (*) 70 - 99 (mg/dL)    BUN 15  6 - 23 (mg/dL)    Creatinine, Ser 9.14  0.50 - 1.10 (mg/dL)    Calcium 9.7  8.4 - 10.5 (mg/dL)    GFR calc non Af Amer 60 (*) >90 (mL/min)    GFR calc Af Amer 70 (*) >90 (mL/min)   POCT I-STAT TROPONIN I     Status: Normal   Collection Time   02/04/11  3:35 PM      Component Value Range Comment   Troponin i, poc 0.00  0.00 - 0.08 (ng/mL)    Comment 3            APTT     Status: Normal   Collection Time   02/04/11  4:24 PM      Component Value Range Comment   aPTT 26  24 - 37 (seconds)   PROTIME-INR     Status: Normal   Collection Time   02/04/11  4:24 PM      Component Value Range Comment   Prothrombin Time 13.6  11.6 - 15.2 (seconds)    INR 1.02  0.00 - 1.49     Dg Chest 2 View  02/04/2011  *RADIOLOGY REPORT*  Clinical Data: Chest pain.  Syncope.  Right internal jugular vein thrombosis related to a prior PICC.  CHEST - 2 VIEW 02/04/2011:  Comparison: Portable chest x-ray 12/23/2010, 06/20/2010 [facility] and two-view chest x-ray 09/05/2009 [facility].  Findings: Cardiomediastinal silhouette unremarkable and unchanged. Lungs clear.  Bronchovascular markings normal.  Pulmonary vascularity normal.  No pleural effusions.  Mild degenerative changes involving the thoracic spine.  No significant interval change.  IMPRESSION: No acute cardiopulmonary disease.  Stable examination.  Original Report Authenticated By: Marya Landry  Lyman Bishop, M.D.   [facility] Pulmonary Per & Vent  02/04/2011  *RADIOLOGY REPORT*  Clinical Data:  Syncope.  Right internal jugular vein thrombosis related to a prior PICC.  NUCLEAR MEDICINE VENTILATION - PERFUSION LUNG SCAN 02/04/2011:  Technique:  Wash-in, equilibrium, and wash-out phase  ventilation images were obtained using Xe-133 gas.  Perfusion images were obtained in multiple projections after intravenous injection of Tc- 56m MAA.  Radiopharmaceuticals:  10 mCi Xe-133 gas and 6 mCi Tc-76m MAA.  Comparison:  None.  Findings: Ventilation images demonstrate normal wash-in and wash- out without evidence of significant air trapping.  Perfusion images demonstrate homogeneous perfusion throughout both lungs.  No segmental or subsegmental perfusion defects are identified to suggest pulmonary embolism.  IMPRESSION: Normal ventilation - perfusion lung scan.  Original Report Authenticated By: Arnell Sieving, M.D.      Assessment Present on Admission:  .Syncope .SEIZURE DISORDER .DVT (deep venous thrombosis) .DIABETES MELLITUS, TYPE II .Chronic pain syndrome .Chest pain .CARCINOMA, BREAST .RENAL INSUFFICIENCY   PLAN: Will admit her to telemetry, rule out for MI with serial CPKs and troponins. I will give her some Dilaudid for pain control. With respect to her left internal jugular vein thrombosis, will defer that decision of anticoagulation to Dr. Marlena Clipper. Currently she is stable. We'll continue her other medications, and place on cardiac monitor. With respect to her line infection, will resume vancomycin intravenously. She is stable, full code, and will be admitted to [facility]. Further pain management should be deferred to her pain specialist.   Other plans as per orders.  Cleophus Mendonsa 02/04/2011, 10:05 PM

## 2011-02-04 NOTE — Progress Notes (Signed)
HPI The patient was added to my schedule today. She was here for carotid Dopplers. At the end of the study she said she felt lightheaded. She was in our waiting area when she went limp. There was apparently a brief loss of consciousness. Her blood pressure systolic when we took it immediately after this was 120. She recalls feeling somewhat lightheaded but can't otherwise give any details. She denies any other recent presyncope or syncope. She's had no palpitations. However, she has not felt well recently. She has a very complicated medical history. She has breast cancer. She has had multiple complications with venous access. She's had Port-A-Cath and Hickman catheters removed because of staph infection. She had a left IJ thrombus recently related to a PICC line. However, anticoagulation with Lovenox led to severe nosebleeds which were evaluated by ENT. Because of the nosebleed she stopped her Lovenox on her own 5 days ago. She did have a followup Doppler today which demonstrated chronic IJ thrombus.  (The final reading is pending).  She reports that she has had some chest discomfort over the last 3 or 4 days that is similar to her previous discomfort associated with her venous thrombus.  The pain is moderate. It is in her left chest and slightly in her back. It hurts when she breathes out.  She also reports fevers up to about 102. She stopped taking by mouth antibiotics recently. She was taking these for a staph bacteremia but stopped prior to the end of the course because she was having oral blisters and nausea. I discussed her case with Dr. Felicity Coyer today and she reports that the patient has had a long history of self management.  Allergies  Allergen Reactions  . Versed Anaphylaxis  . Infliximab Nausea And Vomiting  . Iohexol      Code: SOB   . Ketorolac Tromethamine     REACTION: but pt states not injectable  . Methotrexate Other (See Comments)    unknown  . Nalbuphine Other (See Comments)   unknown  . Nsaids Nausea And Vomiting  . Celecoxib Rash    Current Outpatient Prescriptions  Medication Sig Dispense Refill  . carisoprodol (SOMA) 350 MG tablet TAKE 1 TABLET BY MOUTH FOUR TIMES DAILY AS NEEDED  120 tablet  0  . clobetasol (TEMOVATE) 0.05 % ointment Apply topically 2 (two) times daily.  30 g  0  . clonazePAM (KLONOPIN) 1 MG tablet Take 1 tablet (1 mg total) by mouth 2 (two) times daily as needed.  60 tablet  5  . docusate sodium (COLACE) 100 MG capsule Take 100 mg by mouth 2 (two) times daily.        . DULoxetine (CYMBALTA) 60 MG capsule Take 1 capsule (60 mg total) by mouth 2 (two) times daily.  60 capsule  6  . EPINEPHrine (EPIPEN) 0.3 mg/0.3 mL DEVI Inject 0.3 mLs (0.3 mg total) into the muscle once.  1 Device  1  . fentaNYL (DURAGESIC - DOSED MCG/HR) 75 MCG/HR Place 2 patches onto the skin every 3 (three) days.       . furosemide (LASIX) 20 MG tablet Take 1 tablet (20 mg total) by mouth daily.  30 tablet  3  . gabapentin (NEURONTIN) 300 MG capsule Take 3 capsules (900 mg total) by mouth 3 (three) times daily.  270 capsule  3  . hydrochlorothiazide (HYDRODIURIL) 25 MG tablet TAKE 1 TABLET BY MOUTH EVERY DAY  30 tablet  5  . HYDROmorphone (DILAUDID) 2 MG tablet  Take 2 tablets (4 mg total) by mouth every 4 (four) hours as needed.  60 tablet  0  . omeprazole (PRILOSEC) 40 MG capsule Take 40 mg by mouth every morning.        . potassium chloride SA (K-DUR,KLOR-CON) 20 MEQ tablet Take 1 tablet (20 mEq total) by mouth 2 (two) times daily. Or as directed  60 tablet  3  . Probiotic Product (PROBIOTIC FORMULA) CAPS Take by mouth daily.        Marland Kitchen spironolactone-hydrochlorothiazide (ALDACTAZIDE) 25-25 MG per tablet Take 1 tablet by mouth 2 (two) times daily.  60 tablet  5    Past Medical History  Diagnosis Date  . PERSONALITY DISORDER   . Chronic pain syndrome     chronic narcotics, dependancy hx - [place] for pain mgmt  . MIGRAINE HEADACHE   . Lyme disease   . ALLERGIC RHINITIS    . ANEMIA-IRON DEFICIENCY   . HYPERLIPIDEMIA   . GERD   . DIABETES MELLITUS, TYPE II   . DEPRESSION   . SEIZURE DISORDER   . CARCINOMA, BREAST 08/2009 dx    R breast,  mets to liver - resolution s/p chemo, s/p B mastect  . SLE (systemic lupus erythematosus)     [place] ([place] - chronic pred  . Psoriasis   . Addison's disease   . Metastases to the liver   . Degenerative joint disease   . DDD (degenerative disc disease)     Past Surgical History  Procedure Date  . Breast surgery     breast biopsy  . Liver bopsy   . Port a cath placement   . Port-a-cath removal   . Mastectomy 11/19/10    bilateral mastectomy   . Port-a-cath removal 12/23/2010    Procedure: REMOVAL PORT-A-CATH;  Surgeon: Valarie Merino, MD;  Location: [facility];  Service: General;  Laterality: Left;    Family History  Problem Relation Age of Onset  . Lung cancer Mother   . Lung cancer Father   . Arthritis Other   . Diabetes Other   . Hyperlipidemia Other     History   Social History  . Marital Status: Single    Spouse Name: N/A    Number of Children: N/A  . Years of Education: N/A   Occupational History  . Not on file.   Social History Main Topics  . Smoking status: Never Smoker   . Smokeless tobacco: Never Used  . Alcohol Use: No  . Drug Use: No  . Sexually Active: Not on file   Other Topics Concern  . Not on file   Social History Narrative  . No narrative on file    ROS:  Positive for dizziness, blurred vision, cough, palpitations, nausea, reflux, urinary frequency, joint pains, back pain. Otherwise as stated in the history of present illness and negative for all other systems.  PHYSICAL EXAM BP 114/64  Pulse 84  Temp 97.4 F (36.3 C)  Ht 5\' 5"  (1.651 m)  Wt 194 lb (87.998 kg)  BMI 32.28 kg/m2  SpO2 97% GENERAL:  She looks older than her stated age HEENT:  Pupils equal round and reactive, fundi not visualized, oral mucosa unremarkable NECK:  No jugular venous  distention, waveform within normal limits, carotid upstroke brisk and symmetric, no bruits, no thyromegaly LYMPHATICS:  No cervical, inguinal adenopathy LUNGS:  Clear to auscultation bilaterally BACK:  No CVA tenderness CHEST: Bilateral mastectomy HEART:  PMI not displaced or sustained,S1 and S2  within normal limits, no S3, no S4, no clicks, no rubs, no murmurs ABD:  Flat, positive bowel sounds normal in frequency in pitch, no bruits, no rebound, no guarding, no midline pulsatile mass, no hepatomegaly, no splenomegaly EXT:  2 plus pulses throughout, no edema, no cyanosis no clubbing SKIN:  No rashes no nodules NEURO:  Cranial nerves II through XII grossly intact, motor grossly intact throughout PSYCH:  Cognitively intact, oriented to person place and time, decreased  EKG:  Sinus rhythm, rate 85, incomplete right bundle branch block, QTC prolonged, anterior T-wave inversions.  This is not changed from previous. 02/04/2011  ASSESSMENT AND PLAN

## 2011-02-04 NOTE — ED Provider Notes (Signed)
7:24 PM Pt's V/Q scan was negative.  These results were discussed with Virginia Rochester, M.D., [facility].  They will see and admit pt.     Carleene Cooper III, MD 02/04/11 [phone]

## 2011-02-04 NOTE — Assessment & Plan Note (Signed)
Her chest pain does not sound anginal. I would be concerned about pulmonary emboli and I would think that CT angiography would be indicated. I will defer to the primary team or emergency room. Of note the patient has very difficult venous access.

## 2011-02-04 NOTE — Progress Notes (Signed)
ANTIBIOTIC CONSULT NOTE - INITIAL  Pharmacy Consult for Vancomycin Indication: ? MRSA line infection  Allergies  Allergen Reactions  . Methotrexate Anaphylaxis  . Versed Anaphylaxis  . Ammonia Other (See Comments)    Ended up on ventilator from ammonia tabs  . Contrast Media (Iodinated Diagnostic Agents) Other (See Comments)    Doesn't breath well.  . Infliximab Nausea And Vomiting  . Iohexol Other (See Comments)    SOB   . Keflex Nausea And Vomiting and Other (See Comments)    Blisters.  . Ketorolac Tromethamine Other (See Comments)    Injectable doesn't work and pill hurts stomach.  . Nalbuphine Other (See Comments)    Unknown.  . Nsaids Nausea And Vomiting  . Celecoxib Rash    Patient Measurements: Height: 5\' 5"  (165.1 cm) Weight: 194 lb (87.998 kg) IBW/kg (Calculated) : 57    Vital Signs: Temp: 98.3 F (36.8 C) (12/28 1431) Temp src: Oral (12/28 1431) BP: 102/48 mmHg (12/28 1845) Pulse Rate: 79  (12/28 1845) Intake/Output from previous day:   Intake/Output from this shift:    Labs:  [facility] 02/04/11 1522  WBC 11.0*  HGB 9.7*  PLT 269  LABCREA --  CREATININE 1.02   Estimated Creatinine Clearance: 67.5 ml/min (by C-G formula based on Cr of 1.02). No results found for this basename: VANCOTROUGH:2,VANCOPEAK:2,VANCORANDOM:2,GENTTROUGH:2,GENTPEAK:2,GENTRANDOM:2,TOBRATROUGH:2,TOBRAPEAK:2,TOBRARND:2,AMIKACINPEAK:2,AMIKACINTROU:2,AMIKACIN:2, in the last 72 hours   Microbiology: No results found for this or any previous visit (from the past 720 hour(s)).  Medical History: Past Medical History  Diagnosis Date  . PERSONALITY DISORDER   . Chronic pain syndrome     chronic narcotics, dependancy hx - [place] for pain mgmt  . MIGRAINE HEADACHE   . Lyme disease   . ALLERGIC RHINITIS   . ANEMIA-IRON DEFICIENCY   . HYPERLIPIDEMIA   . GERD   . DIABETES MELLITUS, TYPE II   . DEPRESSION   . SEIZURE DISORDER   . CARCINOMA, BREAST 08/2009 dx    R breast,  mets to  liver - resolution s/p chemo, s/p B mastect  . SLE (systemic lupus erythematosus)     [place] ([place]) - chronic pred  . Psoriasis   . Addison's disease   . Metastases to the liver   . Degenerative joint disease   . DDD (degenerative disc disease)   . Breast cancer     bilateral mastectomy and has mets to liver    Medications:  Scheduled:    . docusate sodium  200 mg Oral BID  . DULoxetine  60 mg Oral BID  . enoxaparin  40 mg Subcutaneous QHS  . fentaNYL  150 mcg Transdermal Q72H  . gabapentin  900 mg Oral TID  . hydrochlorothiazide  25 mg Oral BID  .  HYDROmorphone (DILAUDID) injection  2 mg Intramuscular Once  .  HYDROmorphone (DILAUDID) injection  2 mg Intravenous Once  . HYDROmorphone  2 mg Intravenous Once  . pantoprazole  40 mg Oral Q1200  . potassium chloride SA  20 mEq Oral BID  . promethazine  25 mg Intramuscular Once  . promethazine  25 mg Intravenous To Major  . sodium chloride  1,000 mL Intravenous Once  . sodium chloride  3 mL Intravenous Q12H  . spironolactone  25 mg Oral BID  . vancomycin  1,000 mg Intravenous Once  . DISCONTD: PROBIOTIC FORMULA  1 capsule Oral Daily  . DISCONTD: spironolactone-hydrochlorothiazide  1 tablet Oral BID   Assessment: 55 year old with history of having multiple Hickman catheters  after bilateral mastectomy 2 months ago.  Starting vancomycin for ? Line infection.  Scr is stable.  Goal of Therapy:  Vancomycin trough level 10-15 mcg/ml  Plan:  1) Vancomycin 1 Gram iv Q 12 hours 2) Follow up plan, Scr, cultures  Thank you.  Elwin Sleight 02/04/2011,9:23 PM

## 2011-02-04 NOTE — Assessment & Plan Note (Signed)
I will defer to her oncologist. However, the patient does report to me that she would not want further chemotherapy for her breast cancer.

## 2011-02-04 NOTE — ED Notes (Signed)
Pt transported to nuclear med after iv started and medications administered. Pt happy and satisfied with current plan of care. Second set of blood cultures obtained when iv access obtained. IV site started on the right lateral thumb. Area wrapped and splinted with a tongue depressor in order to keep her from bending her thumb. Pt assisted with bedside commode.

## 2011-02-05 DIAGNOSIS — M329 Systemic lupus erythematosus, unspecified: Secondary | ICD-10-CM

## 2011-02-05 LAB — BASIC METABOLIC PANEL
BUN: 15 mg/dL (ref 6–23)
Calcium: 9.1 mg/dL (ref 8.4–10.5)
Creatinine, Ser: 1.19 mg/dL — ABNORMAL HIGH (ref 0.50–1.10)
GFR calc Af Amer: 58 mL/min — ABNORMAL LOW (ref >90)
GFR calc non Af Amer: 50 mL/min — ABNORMAL LOW (ref >90)
Glucose, Bld: 109 mg/dL — ABNORMAL HIGH (ref 70–99)
Potassium: 3.1 mEq/L — ABNORMAL LOW (ref 3.5–5.1)

## 2011-02-05 LAB — CBC
Hemoglobin: 10.5 g/dL — ABNORMAL LOW (ref 12.0–15.0)
MCH: 24.7 pg — ABNORMAL LOW (ref 26.0–34.0)
MCH: 21.6 pg — ABNORMAL LOW (ref 26.0–34.0)
MCHC: 30.7 g/dL (ref 30.0–36.0)
MCHC: 26.8 g/dL — ABNORMAL LOW (ref 30.0–36.0)
MCV: 80.3 fL (ref 78.0–100.0)
Platelets: 256 10*3/uL (ref 150–400)
Platelets: 178 10*3/uL (ref 150–400)
RBC: 3.85 MIL/uL — ABNORMAL LOW (ref 3.87–5.11)
RDW: 15.7 % — ABNORMAL HIGH (ref 11.5–15.5)

## 2011-02-05 LAB — MRSA PCR SCREENING: MRSA by PCR: NEGATIVE

## 2011-02-05 LAB — GLUCOSE, CAPILLARY: Glucose-Capillary: 174 mg/dL — ABNORMAL HIGH (ref 70–99)

## 2011-02-05 LAB — TSH: TSH: 3.428 u[IU]/mL (ref 0.350–4.500)

## 2011-02-05 LAB — MAGNESIUM: Magnesium: 1.8 mg/dL (ref 1.5–2.5)

## 2011-02-05 LAB — CARDIAC PANEL(CRET KIN+CKTOT+MB+TROPI)
CK, MB: 2.2 ng/mL (ref 0.3–4.0)
Relative Index: INVALID (ref 0.0–2.5)
Total CK: 61 U/L (ref 7–177)
Total CK: 56 U/L (ref 7–177)
Troponin I: <0.3 ng/mL (ref <0.30)
Troponin I: <0.3 ng/mL (ref <0.30)

## 2011-02-05 MED ORDER — POTASSIUM CHLORIDE CRYS ER 20 MEQ PO TBCR
20.0000 meq | EXTENDED_RELEASE_TABLET | Freq: Once | ORAL | Status: AC
  Administered 2011-02-05: 20 meq via ORAL

## 2011-02-05 MED ORDER — PROMETHAZINE HCL 25 MG/ML IJ SOLN
12.5000 mg | Freq: Four times a day (QID) | INTRAMUSCULAR | Status: DC | PRN
  Administered 2011-02-05 – 2011-02-07 (×4): 12.5 mg via INTRAVENOUS
  Filled 2011-02-05 (×4): qty 1

## 2011-02-05 MED ORDER — HYDROMORPHONE HCL PF 1 MG/ML IJ SOLN
2.0000 mg | INTRAMUSCULAR | Status: DC | PRN
  Administered 2011-02-05 – 2011-02-06 (×5): 2 mg via INTRAVENOUS
  Filled 2011-02-05 (×5): qty 2

## 2011-02-05 MED ORDER — POTASSIUM CHLORIDE 20 MEQ PO PACK: 20.0000 meq | PACK | Freq: Every day | ORAL | Status: DC

## 2011-02-05 MED ORDER — PROMETHAZINE HCL 25 MG PO TABS
12.5000 mg | ORAL_TABLET | Freq: Four times a day (QID) | ORAL | Status: DC | PRN
  Filled 2011-02-05: qty 1

## 2011-02-05 MED ORDER — PROMETHAZINE HCL 25 MG/ML IJ SOLN
12.5000 mg | Freq: Four times a day (QID) | INTRAMUSCULAR | Status: DC | PRN
  Administered 2011-02-05 (×2): 12.5 mg via INTRAVENOUS
  Filled 2011-02-05 (×2): qty 1

## 2011-02-05 NOTE — Progress Notes (Signed)
[facility]ITAL[facility] progress note    Interval h/o:-  55 y.o. female with multiple medical problems including breast cancer, status post bilateral mastectomy, october 2012 recent left internal jugular thrombosis (about a month ago), recurrent nose bleed requiring cauterization and discontinuation of anticoagulation., Chronic pain syndrome and narcotic dependency, history of seizure disorder, diabetes, GERD, sent in from Dr. Areatha Keas office for sudden episode of loss of consciousness.    Evaluation in emergency room included a mild anemia with hemoglobin of 9.7, normal white count, negative cardiac markers, and a negative ventilation perfusion scan   Subjective: Awake but irritable as "has not had any sleep in 48 hours" as she hasn' had trazadone States she hurts really badly-mainly in chest.    Was supposed to be on Keflex fo the chronic of 20 days and wasn't able to finish this due to her she started having N/V. Sats dropped to 86% after walking  Treatment Team:  Hollice Espy, MD [place] Siren Objective: Vital signs in last 24 hours: Temp:  [98 F (36.7 C)-98.6 F (37 C)] 98.6 F (37 C) (12/29 1512) Pulse Rate:  [79-82] 81  (12/29 1512) Resp:  [20] 20  (12/29 1512) BP: (102-132)/(48-82) 125/75 mmHg (12/29 1512) SpO2:  [83 %-100 %] 92 % (12/29 1512) Weight:  [87.998 kg (194 lb)-92.2 kg (203 lb 4.2 oz)] 203 lb 4.2 oz (92.2 kg) (12/29 1512) Weight change:   Intake/Output Summary (Last 24 hours) at 02/05/11 1552 Last data filed at 02/05/11 1512  Gross per 24 hour  Intake    720 ml  Output      3 ml  Net    717 ml    BP 125/75  Pulse 81  Temp(Src) 98.6 F (37 C) (Oral)  Resp 20  Ht 5\' 5"  (1.651 m)  Wt 92.2 kg (203 lb 4.2 oz)  BMI 33.82 kg/m2  SpO2 92% General appearance: alert, slowed mentation and falls aslpee on talking to me Throat: lips, mucosa, and tongue normal; teeth and gums normal Lungs: clear to auscultation bilaterally and normal percussion  bilaterally Breasts: Bilateral mastectomy-no fluctuance ofr erthema and clean scars Pulses: 2+ and symmetric   Lab Results:  [facility] 02/05/11 0624 02/05/11 0122 02/04/11 1522  NA 137 -- 135  K 3.1* -- 3.4*  CL 95* -- 92*  CO2 31 -- 31  GLUCOSE 109* -- 136*  BUN 15 -- 15  CREATININE 1.19* -- 1.02  CALCIUM 9.1 -- 9.7  MG -- 1.8 --  PHOS -- -- --    [facility] 02/05/11 0624 02/05/11 0122  WBC 6.4 9.1  NEUTROABS -- --  HGB 10.5* 9.5*  HCT 39.2 30.9*  MCV 80.8 80.3  PLT 178 256    Basename 02/05/11 0950 02/05/11 0122  CKTOTAL 57 61  CKMB 2.0 1.9  CKMBINDEX -- --  TROPONINI <0.30 <0.30    Basename 02/05/11 0121  TSH 3.428  T4TOTAL --  T3FREE --  THYROIDAB --   Micro Results: Recent Results (from the past 240 hour(s))  MRSA PCR SCREENING     Status: Normal   Collection Time   02/04/11 10:14 PM      Component Value Range Status Comment   MRSA by PCR NEGATIVE  NEGATIVE  Final    Medications: I have reviewed the patient's current medications. Scheduled Meds:   . docusate sodium  200 mg Oral BID  . DULoxetine  60 mg Oral BID  . enoxaparin  40 mg Subcutaneous QHS  . fentaNYL  150 mcg Transdermal  Q72H  . gabapentin  900 mg Oral TID  . hydrochlorothiazide  25 mg Oral BID  .  HYDROmorphone (DILAUDID) injection  2 mg Intramuscular Once  .  HYDROmorphone (DILAUDID) injection  2 mg Intravenous Once  . HYDROmorphone  2 mg Intravenous Once  . pantoprazole  40 mg Oral Q1200  . potassium chloride SA  20 mEq Oral BID  . promethazine  25 mg Intramuscular Once  . promethazine  25 mg Intravenous To Major  . saccharomyces boulardii  250 mg Oral Daily  . sodium chloride  1,000 mL Intravenous Once  . sodium chloride  3 mL Intravenous Q12H  . spironolactone  25 mg Oral BID  . vancomycin  1,000 mg Intravenous Once  . vancomycin  1,000 mg Intravenous Q12H  . DISCONTD: PROBIOTIC FORMULA  1 capsule Oral Daily  . DISCONTD: spironolactone-hydrochlorothiazide  1 tablet Oral BID    Continuous Infusions:  PRN Meds:.sodium chloride, carisoprodol, clonazePAM, HYDROmorphone, promethazine, sodium chloride, technetium albumin aggregated, xenon xe 133, DISCONTD: promethazine   Assessment/Plan: Patient Active Hospital Problem List: Syncope (02/04/2011)   Assessment: unclear etiology-likely vasovagaled, as there was direct pressure over the Carotid.  Will hold further work-up for now given likely cause being in the L IJ Breats Ca, 08/2009 - hx liver mets, resolved s/p chemo (09/07/2009)   Assessment:will follow with ON c as needed-S/p mastectomy in cot '12 DIABETES MELLITUS, TYPE II (09/07/2009)   Assessment: blood sugars 109-174 Chronic pain syndrome (09/07/2009)   Assessment: Review of old notes denotes signifcant concerns for self medication with IV pain meds Have attempted to explain that with 1 IV access only, the more we manipulate the IV, the more chances of losing the IV She demonstrates signfcant somnolence and I have cut back her pain med IV to Dilaudid q 4 SeiZURE DISORDER (09/07/2009)   Assessment: copntineu chronic med Chest pain (02/04/2011)   Assessment: rruled out for Cardiogenic/PE related pain. DVT L IJ 12/26/10 (02/04/2011)   Assessment: See #1-will ask Hem-Onc to see Unclear if she needs IV vancomycin, as no systemic evidecnes of fevr chills, other then self reported -Have Asked ID to see her in view of this-if not needed, would change back to PO Keflex, duration to be determined by ID SLE-on prednisone-Follow at [facility] (02/05/2011)   Assessment: Continue chronic prednisone Hypokalemia-repalce orally      LOS: 1 day   Brigg Cape,JAI 02/05/2011, 3:52 PM

## 2011-02-05 NOTE — Progress Notes (Signed)
Physical Therapy Evaluation Patient Details Name: ALLYN BARTELSON MRN: [id number] DOB: 07-20-1955 Today's Date: 02/05/2011  Problem List:  Patient Active Problem List  Diagnoses  . CARCINOMA, BREAST  . DIABETES MELLITUS, TYPE II  . HYPERLIPIDEMIA  . ANEMIA-IRON DEFICIENCY  . PERSONALITY DISORDER  . DEPRESSION  . Chronic pain syndrome  . MIGRAINE HEADACHE  . ALLERGIC RHINITIS  . GERD  . RENAL INSUFFICIENCY  . SEIZURE DISORDER  . Immunocompromised patient  . Edema extremities  . DVT (deep venous thrombosis)  . Normocytic anemia  . Chest pain  . Syncope  . Fever  . Internal jugular vein thrombosis    Past Medical History:  Past Medical History  Diagnosis Date  . PERSONALITY DISORDER   . Chronic pain syndrome     chronic narcotics, dependancy hx - [place] for pain mgmt  . MIGRAINE HEADACHE   . Lyme disease   . ALLERGIC RHINITIS   . ANEMIA-IRON DEFICIENCY   . HYPERLIPIDEMIA   . GERD   . DIABETES MELLITUS, TYPE II   . DEPRESSION   . CARCINOMA, BREAST 08/2009 dx    R breast,  mets to liver - resolution s/p chemo, s/p B mastect  . SLE (systemic lupus erythematosus)     [facility] - chronic pred  . Psoriasis   . Addison's disease   . Metastases to the liver   . Degenerative joint disease   . DDD (degenerative disc disease)   . Breast cancer     bilateral mastectomy and has mets to liver  . Blood transfusion   . Phlebitis after infusion     left ?calf; "S/P phenergan/demerol injection"  . Blood clot in vein 2011; 02/04/11    "right jugular vein;left jugular vein"  . Angina   . Pleurisy     "alot; put me in hospital q time; related to my SLE"  . Shortness of breath 02/04/11    "always here lately"  . Hypothyroidism     "took medicine while in hospital 08/2009 only"  . H/O hiatal hernia   . Stomach ulcer   . SEIZURE DISORDER     "lots of seizures; due to lupus"  . Ventilator dependent 1980's    "for 3 months S/P seizure"  . Anxiety   .  Neuromuscular disorder   . Neuropathy     hands, feet, due to diabetes & back problem  . Herniated disc     "lumbar area w/bulging"  . Syncope and collapse 02/04/11    "not the first time either"   Past Surgical History:  Past Surgical History  Procedure Date  . Liver bopsy 09/2009  . Port a cath placement 09/2009; 11/19/10  . Port-a-cath removal 04/2010    "due to staph & strept"  . Port-a-cath removal 12/23/2010    Procedure: REMOVAL PORT-A-CATH;  Surgeon: Valarie Merino, MD;  Location: [facility];  Service: General;  Laterality: Left;  . Breast surgery 09/2009    right breast biopsy  . Mastectomy 11/19/10    bilateral mastectomy   . I&d extremity ~ 2010    right hand; S/P "dog bite"    PT Assessment/Plan/Recommendation PT Assessment Clinical Impression Statement: Pt presents with multiple medical problems and was primarily limited today by pain. Pt moves well and is supervisional level with ambulation. Pt desaturated 3x during treatment to SpO2 = 83% on room air however with cues for deep breathing resaturated to ~98% in about 1 minute. RN made aware, pt left on  2L [place] O2 however desaturated to 88% 1x on 2L, then with cues for breathing resaturated to 98%. Pt will benefit from HHPT to increase strength and functional mobility however does not need skilled PT in the acute setting however will benefit from frequent supervisional ambulation with nursing. Recommend close observation of SpO2.  PT Recommendation/Assessment: All further PT needs can be met in the next venue of care - PT signing off PT Problem List: Decreased strength;Decreased activity tolerance;Decreased mobility;Pain PT Therapy Diagnosis : Acute pain;Generalized weakness PT Recommendation Follow Up Recommendations: Home health PT Equipment Recommended: None recommended by PT (pt well equipt. ) PT Goals   PT signing off.    PT Evaluation Precautions/Restrictions    Prior Functioning  Home Living Lives With:  Friend(s) Receives Help From: Friend(s) Type of Home: House Home Layout: One level Home Access: Stairs to enter Entrance Stairs-Rails: Right Entrance Stairs-Number of Steps: 4 Bathroom Shower/Tub: Engineer, manufacturing systems: Handicapped height Bathroom Accessibility: Yes How Accessible: Accessible via walker Home Adaptive Equipment: Bedside commode/3-in-1;Shower chair with back;Walker - rolling;Walker - four wheeled;Wheelchair - manual Prior Function Level of Independence: Independent with basic ADLs Driving: No Comments: Pt reports when she is having a bad day her friend helps her, she is there 24 hour/day.  Cognition Cognition Arousal/Alertness: Lethargic Overall Cognitive Status: Difficult to assess Difficult to assess due to: level of arousal Cognition - Other Comments: Pt lethargic, pt making some random commends not appropriate to situation. Question level of arousal vs. medication.  Sensation/Coordination Sensation Light Touch: Appears Intact Extremity Assessment RLE Assessment RLE Assessment: Exceptions to [facility] RLE AROM (degrees) Overall AROM Right Lower Extremity: Within functional limits for tasks assessed RLE Strength RLE Overall Strength: Within Functional Limits for tasks assessed;Deficits RLE Overall Strength Comments: Functional deficits noted by pt, generalized weakness.  LLE Assessment LLE Assessment: Exceptions to WFL LLE AROM (degrees) Overall AROM Left Lower Extremity: Within functional limits for tasks assessed LLE Strength LLE Overall Strength: Deficits;Within Functional Limits for tasks assessed LLE Overall Strength Comments: Tactile/Verbal cues for equal LE weightbearing, bil. knee/hip extension, upright trunk posture.  Mobility (including Balance) Bed Mobility Bed Mobility: Yes Supine to Sit: 6: Modified independent (Device/Increase time) (Increased time) Transfers Transfers: Yes Sit to Stand: 6: Modified independent (Device/Increase  time) Stand to Sit: 6: Modified independent (Device/Increase time) Ambulation/Gait Ambulation/Gait: Yes Ambulation/Gait Assistance: 5: Supervision Ambulation/Gait Assistance Details (indicate cue type and reason): Verbal cues to monitor symptoms.  Ambulation Distance (Feet): 130 Feet Assistive device: None Gait Pattern: Within Functional Limits Gait velocity: Decreased cadence.  Posture/Postural Control Posture/Postural Control: No significant limitations Balance Balance Assessed: Yes Static Standing Balance Static Standing - Balance Support: No upper extremity supported Static Standing - Level of Assistance: 5: Stand by assistance Dynamic Standing Balance Dynamic Standing - Balance Support: No upper extremity supported Dynamic Standing - Level of Assistance: 4: Min assist Dynamic Standing - Balance Activities: Lateral lean/weight shifting Exercise  General Exercises - Lower Extremity Ankle Circles/Pumps: AROM;Both;15 reps;Seated Long Arc Quad: AROM;Both;20 reps;Seated Heel Slides: AROM;Both;20 reps;Seated Hip Flexion/Marching: AROM;5 reps;Seated Other Exercises Other Exercises: Sit/stand for exercise x 2, pt reporting this is too painful to continue.  End of Session PT - End of Session Equipment Utilized During Treatment: Gait belt Activity Tolerance: Patient limited by fatigue;Patient limited by pain;Treatment limited secondary to medical complications (Comment) (Desaturation with ambulation) Patient left: in chair;with call bell in reach;with family/visitor present Nurse Communication: Mobility status for ambulation;Mobility status for transfers General Behavior During Session: Lethargic Cognition: Impaired Cognitive Impairment: ?  medication, pt with decreased processing.   Sherie Don 02/05/2011, 2:25 PM  Sherie Don) Carleene Mains PT, DPT Acute Rehabilitation [phone]

## 2011-02-05 NOTE — Progress Notes (Signed)
Chaplain Note:  In response to spiritual care consult order, chaplain visited pt.  Pt was deeply asleep at the time of visit and would not rouse when her name was called.  Chaplain will refer pt to chaplain for visit w/n next 24 hrs.  Chaplain did not change order status to complete.   02/05/11 1700  Clinical Encounter Type  Visited With Patient  Visit Type Spiritual support  Referral From Nurse (spiritual care consult order)  Recommendations Chaplain follow up  Spiritual Encounters  Spiritual Needs Other (Comment) (On identified at this time)  Stress Factors  Patient Stress Factors None identified  Family Stress Factors None identified    Verdie Shire, chaplain resident [phone]

## 2011-02-05 NOTE — Progress Notes (Signed)
Pt had a K+ of 3.1 today. MD informed, no orders given. Peter Congo RN

## 2011-02-06 DIAGNOSIS — C50919 Malignant neoplasm of unspecified site of unspecified female breast: Secondary | ICD-10-CM

## 2011-02-06 DIAGNOSIS — I82C29 Chronic embolism and thrombosis of unspecified internal jugular vein: Secondary | ICD-10-CM

## 2011-02-06 DIAGNOSIS — M7989 Other specified soft tissue disorders: Secondary | ICD-10-CM

## 2011-02-06 LAB — GLUCOSE, CAPILLARY: Glucose-Capillary: 127 mg/dL — ABNORMAL HIGH (ref 70–99)

## 2011-02-06 LAB — CBC
HCT: 33.4 % — ABNORMAL LOW (ref 36.0–46.0)
Hemoglobin: 10.2 g/dL — ABNORMAL LOW (ref 12.0–15.0)
RBC: 4.16 MIL/uL (ref 3.87–5.11)
WBC: 6.5 10*3/uL (ref 4.0–10.5)

## 2011-02-06 LAB — HEPATIC FUNCTION PANEL
AST: 14 U/L (ref 0–37)
Albumin: 3.5 g/dL (ref 3.5–5.2)
Alkaline Phosphatase: 85 U/L (ref 39–117)
Total Bilirubin: 0.3 mg/dL (ref 0.3–1.2)

## 2011-02-06 MED ORDER — FLUCONAZOLE 100 MG PO TABS
100.0000 mg | ORAL_TABLET | Freq: Every day | ORAL | Status: DC
  Administered 2011-02-06 – 2011-02-07 (×2): 100 mg via ORAL
  Filled 2011-02-06 (×2): qty 1

## 2011-02-06 MED ORDER — HYDROCHLOROTHIAZIDE 25 MG PO TABS
25.0000 mg | ORAL_TABLET | Freq: Every day | ORAL | Status: DC
  Administered 2011-02-07: 25 mg via ORAL
  Filled 2011-02-06: qty 1

## 2011-02-06 MED ORDER — HYDROMORPHONE HCL 2 MG PO TABS
4.0000 mg | ORAL_TABLET | ORAL | Status: DC | PRN
  Administered 2011-02-06 – 2011-02-07 (×5): 4 mg via ORAL
  Filled 2011-02-06: qty 1
  Filled 2011-02-06 (×3): qty 2
  Filled 2011-02-06: qty 1
  Filled 2011-02-06: qty 2

## 2011-02-06 MED ORDER — HYDROMORPHONE HCL PF 1 MG/ML IJ SOLN
2.0000 mg | INTRAMUSCULAR | Status: DC | PRN
  Administered 2011-02-06: 2 mg via INTRAVENOUS
  Filled 2011-02-06: qty 2

## 2011-02-06 MED ORDER — TRAZODONE HCL 150 MG PO TABS
150.0000 mg | ORAL_TABLET | Freq: Every day | ORAL | Status: DC
  Administered 2011-02-06: 150 mg via ORAL
  Filled 2011-02-06 (×2): qty 1

## 2011-02-06 NOTE — Progress Notes (Signed)
[facility]PITAL[facility] progress note    Interval h/o:-  [name] Carr is an 55 y.o. female with a history of DM2, SLE, breast cancer dx 08-2009 with mets to liver as well. She has received CTX for this. She underwent bilateral mastectomy October 2012 and her course was complicated by cellulitis. She was d/c home with IV vancomycin.  She was re-admitted on 12/19/2010 with fever altered mental status (temp as high as 103.8). She was placed on broad-spectrum antibiotics. A CT scan of her chest was done on 12/21/2010 which showed bilateral chest wall cellulitis and a 12x4 centimeter fluid collection in the subcutaneous fat on the right. General surgery and her Port-A-Cath was removed on 12/23/18. 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 At her hospitalization in November her only positive BCx was P agglomerans (1/2). Her course was also complicated by L IJ DVT (12-25-10).  She was initially treated with broad anbx, then changed to cefepime then d/c home with IV cephalexin. She was thought to be injecting pain medications into her PIC line and this was therefore removed on 12-5. She was changed to po keflex at that time but states that this gave her oral ulcers.  She now returns12-28-12 after a syncopal episode in her cardiologists office. Her WBC was 11, which has since decreased into the normal range. She has been afebrile in the hospital. But she states that she was having temps at home to 101.8. Her repeat BCx are NGTD. She complains of sore tongue, oral thrush. Also, intertriginous erythema and yeast.  Evaluation in emergency room included a mild anemia with hemoglobin of 9.7, normal white count, negative cardiac markers, and a negative ventilation perfusion scan  Oncology has seen the patient and has ordered a PEt scan and another Doppler of the neck to determine the burden of clot.   She has demonstrated significant pain needs while here necessitating IV  dilaudid  Subjective: Hurting more.  Sick on stomach.  Pain primarily in chest.  Much more awake than yesterday, states she was only able to sleep after getting her trazodone. Persists on her pain-when questioned about differing modalities she has trialed, states that her pain MD "will not change anything".  Has trailed lyrica, but was too sleepy on this.  Is also on Neurontin in addition to her pain patches-refused up-titration of pain patches yesterday No fever, no chills, no N/V. Reported some thrush  Treatment Team:  Hollice Espy, MD Tressa Busman, MD Objective: Vital signs in last 24 hours: Temp:  [97.6 F (36.4 C)-98.6 F (37 C)] 97.6 F (36.4 C) (12/30 0553) Pulse Rate:  [74-81] 74  (12/30 0553) Resp:  [17-20] 17  (12/30 0553) BP: (107-125)/(70-75) 107/71 mmHg (12/30 0553) SpO2:  [92 %-95 %] 93 % (12/30 0553) Weight:  [91.8 kg (202 lb 6.1 oz)-92.2 kg (203 lb 4.2 oz)] 202 lb 6.1 oz (91.8 kg) (12/30 0553) Weight change: 4.202 kg (9 lb 4.2 oz)  Intake/Output Summary (Last 24 hours) at 02/06/11 1334 Last data filed at 02/06/11 1100  Gross per 24 hour  Intake 425.67 ml  Output      2 ml  Net 423.67 ml    BP 107/71  Pulse 74  Temp(Src) 97.6 F (36.4 C) (Oral)  Resp 17  Ht 5\' 5"  (1.651 m)  Wt 91.8 kg (202 lb 6.1 oz)  BMI 33.68 kg/m2  SpO2 93% General appearance: alert, slowed mentation and falls aslpee  on talking to me Throat: lips, mucosa, and tongue normal; teeth and gums normal Lungs: clear to auscultation bilaterally and normal percussion bilaterally Breasts: Bilateral mastectomy-no fluctuance ofr erthema and clean scars Pulses: 2+ and symmetric  Lab Results:  Basename 02/05/11 0624 02/05/11 0122 02/04/11 1522  NA 137 -- 135  K 3.1* -- 3.4*  CL 95* -- 92*  CO2 31 -- 31  GLUCOSE 109* -- 136*  BUN 15 -- 15  CREATININE 1.19* -- 1.02  CALCIUM 9.1 -- 9.7  MG -- 1.8 --  PHOS -- -- --    Basename 02/06/11 0942 02/05/11 0624  WBC 6.5 6.4   NEUTROABS -- --  HGB 10.2* 10.5*  HCT 33.4* 39.2  MCV 80.3 80.8  PLT 272 178    Basename 02/05/11 1553 02/05/11 0950 02/05/11 0122  CKTOTAL 56 57 61  CKMB 2.2 2.0 1.9  CKMBINDEX -- -- --  TROPONINI <0.30 <0.30 <0.30    Basename 02/05/11 0121  TSH 3.428  T4TOTAL --  T3FREE --  THYROIDAB --   Micro Results: Recent Results (from the past 240 hour(s))  CULTURE, BLOOD (ROUTINE X 2)     Status: Normal (Preliminary result)   Collection Time   02/04/11  4:40 PM      Component Value Range Status Comment   Specimen Description BLOOD HAND LEFT   Final    Special Requests     Final    Value: BOTTLES DRAWN AEROBIC AND ANAEROBIC 7CC AER 5CC ANA   Setup Time [id number]   Final    Culture     Final    Value:        BLOOD CULTURE RECEIVED NO GROWTH TO DATE CULTURE WILL BE HELD FOR 5 DAYS BEFORE ISSUING A FINAL NEGATIVE REPORT   Report Status PENDING   Incomplete   CULTURE, BLOOD (ROUTINE X 2)     Status: Normal (Preliminary result)   Collection Time   02/04/11  4:55 PM      Component Value Range Status Comment   Specimen Description BLOOD ARM RIGHT   Final    Special Requests BOTTLES DRAWN AEROBIC AND ANAEROBIC 5CC   Final    Setup Time [id number]   Final    Culture     Final    Value:        BLOOD CULTURE RECEIVED NO GROWTH TO DATE CULTURE WILL BE HELD FOR 5 DAYS BEFORE ISSUING A FINAL NEGATIVE REPORT   Report Status PENDING   Incomplete   MRSA PCR SCREENING     Status: Normal   Collection Time   02/04/11 10:14 PM      Component Value Range Status Comment   MRSA by PCR NEGATIVE  NEGATIVE  Final    Medications: I have reviewed the patient's current medications. Scheduled Meds:    . docusate sodium  200 mg Oral BID  . DULoxetine  60 mg Oral BID  . enoxaparin  40 mg Subcutaneous QHS  . fentaNYL  150 mcg Transdermal Q72H  . gabapentin  900 mg Oral TID  . hydrochlorothiazide  25 mg Oral Daily  . pantoprazole  40 mg Oral Q1200  . potassium chloride SA  20 mEq Oral BID  .  potassium chloride  20 mEq Oral Once  . saccharomyces boulardii  250 mg Oral Daily  . sodium chloride  3 mL Intravenous Q12H  . spironolactone  25 mg Oral BID  . traZODone  150 mg Oral QHS  . vancomycin  1,000 mg  Intravenous Q12H  . DISCONTD: hydrochlorothiazide  25 mg Oral BID  . DISCONTD: potassium chloride  20 mEq Oral Daily   Continuous Infusions:  PRN Meds:.sodium chloride, carisoprodol, clonazePAM, HYDROmorphone, promethazine, sodium chloride, DISCONTD: HYDROmorphone, DISCONTD: promethazine   Assessment/Plan: Patient Active Hospital Problem List: Syncope (02/04/2011)   Assessment: unclear etiology-likely vasovagaled, as there was direct pressure over the Carotid., with one of the carotid arteries (left IJ) occluded with clot  Will hold further work-up for now given likely cause being in the L IJ  Breats Ca, 08/2009 - hx liver mets, resolved s/p chemo (09/07/2009)   Assessment:will follow with ON c as needed-S/p mastectomy in cot '12 She will get a [facility].  Her LFt's ordered by Oncologist are completely normal  DIABETES MELLITUS, TYPE II (09/07/2009)   Assessment: blood sugars optimal on admit  Chronic pain syndrome (09/07/2009)   Assessment: Review of old notes denotes signifcant concerns for self medication with IV pain meds Have attempted to explain that with 1 IV access only, the more we manipulate the IV, the more chances of losing the IV Somnolence is improved-she perseveres on only wishing IV pain meds States that the chemotherapy caused her significant burning of her abdomen/esophagus.  When asked if she has had a work-up for this, she states "i don't wish to be poked or prodded anymore"  SeiZURE DISORDER (09/07/2009)   Assessment: copntineu chronic med  Chest pain (02/04/2011)   Assessment: ruled out for Cardiogenic/PE related pain.  DVT L IJ 12/26/10 (02/04/2011)   Assessment: See #1-will ask Hem-Onc to see Unclear if she needs IV vancomycin, as no  systemic evidecnes of fevr chills, other then self reported -Have Asked ID to see her in view of this-if not needed, would change back to PO Keflex, duration to be determined by ID  SLE-on prednisone-Follow at [facility] (02/05/2011)   Assessment: Continue chronic prednisone  Hypokalemia-repalce orally     Appreciate consultant input-If Blood cultures are negative, would likely discharge back into car eof PCP Will contact pain physician for collateral info in am   LOS: 2 days   Mardell Cragg,JAI 02/06/2011, 1:34 PM

## 2011-02-06 NOTE — Progress Notes (Signed)
Spiritual Care: Show pt based on an incomplete Order Requisition. Night Chaplain visited but pt was in a sound sleep.   Spoke with pt at length about subjects of her own choosing. Pt still upset at former cancer center physician who told her she did not need to take certain medications or complete certain tasks because "you are going to die." Pt is a former Charity fundraiser who was so offended by such "harsh" speech that her anger caused her to stop treatment with that cancer center, which is nearer her home in [facility]. She confesses her care at [facility], [facility]) is 180 degrees different. In pt words, "staff here knows how to speak to pt with bad conditions, and still tell a pt honestly want is going on." Despite having Stage IV cancer, pt feels she should not give up until she has to. She wishes to be the director of her own fate, with consultation from medical staff, which she acknowledges is how she is treated here.  Still her former experience was so shattering that she fears having staff be too blunt, or withholding information.   Discuss was pt's thinking about not going through chemo again and the result that decision should have on her future. We discussed her fears, her hopes and her current state of health. We also discussed the anger she still feels toward a former physician, in another health care system, and how that has affected her behaviors toward self care. Pt, without mentioning it as such, spoke of a palliative approach to her care - quality of life - and being resolved to speak more clearly to fellow nurses about her needs and wishes medically.   Pt says she has a hard time sleeping at night when she is alone, which causes her to sleep more during the day. This likely is end of life fears, where daylight equals another day alive - thus one can sleep with less fear - and darkness equals a possible time when death will over take one - thus staying  awake keeps death at bay.   All of the information above was discussed with room nurse, who will use the knowledge of perceived medical harshness to plan medical discussions with pt. Our brief consult session was productive in hearing how pt has been responding to medical care.  Prayer was asked for and given. Recommendation for continued spiritual care discussions will be passed to weekday chaplains.  Pilar Corrales [employer]

## 2011-02-06 NOTE — Progress Notes (Signed)
REFERRING PHYSICIAN:  Pleas Koch, MD  REASON FOR CONSULTATION: 1. Metastatic breast cancer. 2. 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  HISTORY OF PRESENT ILLNESS:  [name] Carr is a 55 year old, white female. She has been seen by Dr. Cyndie Chime in the office.  Her history is very sketchy.  She has stage IV metastatic breast cancer.  She has not had treatment for this for several months.  She was treated with I think Xeloda/gemcitabine.  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  She apparently is "not tolerating" anticoagulants.  Again this is unclear to me.  She has been on Lovenox.  She said she has nose bleeds with Lovenox.  She has never been on Coumadin.  She has been on Xarelto.  She recently now admitted because of a syncopal episode in the cardiologist office.  Again, she has not been treated for her breast cancer for several months.  She certainly does look quite good from an oncologic point of view.  She did have a V/Q scan done on the 28th.  This was negative. She had a chest x-ray done.  This also was unremarkable.  Lab work that was done on admission, basically showed normal electrolytes though her potassium was on the low side.  Her CBC was okay.  She was slightly anemic with a hemoglobin 9.5, hematocrit 30.9.  Her PT/INR was 13.6/1.02.  PTT was normal.  She has multiple medical issues.  Back in August, she had a normal CA27.29 of 37.  We are asked to see to try to help with management of her oncologic and coagulable problems.  PAST MEDICAL HISTORY: 1. Insulin-dependent diabetes. 2. GERD. 3. Hyperlipidemia. 4. Migraines. 5. Seizure disorder. 6. Depression. 7. Psoriasis. 8. Metastatic breast cancer. 9. Chronic pain syndrome. 10.Personality disorder.  ALLERGIES: 1. Methotrexate. 2. Versed. 3. Iodine. 4. Keflex. 5. Nonsteroidals. 6. Celebrex. 7. Nalbuphine. 8. Infliximab.  ADMISSION MEDICATIONS:  Soma 750 mg p.o. q.i.d.  p.r.n., Klonopin 1 mg p.o. t.i.d. p.r.n., Cymbalta 60 mg p.o. b.i.d., Lasix 20 mg p.o. daily, Neurontin 100 mg p.o. t.i.d., hydrochlorothiazide 25 mg daily, Dilaudid 4 mg q.4 hours p.r.n., Prilosec 40 mg p.o. daily, potassium 40 mEq p.o. daily, Duragesic patch 150 mcg every 3 days.  SOCIAL HISTORY:  Negative for tobacco or alcohol use.  FAMILY HISTORY:  As stated in the history of present illness.  PHYSICAL EXAMINATION:  This is a well-developed, well-nourished, white female, in no obvious distress.  Vital signs:  97.6, pulse 74, respiratory rate 17, blood pressure is 125/75.  Head and neck: Normocephalic, atraumatic skull.  There is no ocular or oral lesion.  No palpable cervical or supraclavicular lymph nodes.  Lungs are clear bilaterally.  Cardiac exam is regular rate and rhythm with a normal S1 and S2.  There are no murmurs, rubs, or bruits.  Abdomen: Soft with good bowel sounds.  There is no palpable abdominal mass.  There is no fluid wave.  There is no palpable pedal splenomegaly.  Extremities shows no clubbing, cyanosis, or edema.  She has some psoriasis on her elbows. Neurologic:  No focal neurological deficits.  IMPRESSION:  [name] is a 55 year old female with multiple medical problems.  She has metastatic breast cancer.  Obviously, her breast cancer cannot be that bad as she has not been treated now for several months.  She probably needs to have a CT scan to try to assess for progression of disease.  It is still not clear as to  this DVT that she has in the left internal jugular vein.  This certainly appears to be a chronic issue.  I am not sure at all if this really even needs to be treated.  She has had this now for over a year.  She is not sure as to how long she has had this.  Personally, I do not see any reason why she cannot be on Coumadin.  I do not see any abnormalities with respect to her liver.  Unfortunately, no liver tests have been done but I still do not  see any issue with her liver.  Again, she really cannot have that many metastases if her CA27.29 was normal a couple of months ago.  She does not look like she had rapidly progressive disease.  There is certainly more to what is going on here than we know from my point of view.  Again I am not sure what other issues might be ongoing.  She has had multiple Port-A-Caths.  She has had staph infections with these.  Again this is somewhat interesting.  I think that a conservative approach to her management would be reasonable.  Will follow along.  We will see what where her breast cancer stands.  We will have Dr. Cyndie Chime see the patient while she is in the hospital.       ______________________________ [name] Carr, M.D. PRE/MEDQ  D:  02/06/2011  T:  02/06/2011  Job:  [id number]

## 2011-02-06 NOTE — Progress Notes (Signed)
Assessed pt at 0830. Pt had been sleeping and was now awake but lethargic. Asked pt if in pain and she responded yes. Asked pt pain level and pt sat with eyes closed and was silent. Thought pt had drifted off to sleep and asked pt again what pain level was. She said "I,m thinking", with eyes closed. Finally responded that pain level was an "8". Pt was still half hour from being able to receive medication. Gave pain medication at 0905. Peter Congo RN

## 2011-02-06 NOTE — Progress Notes (Addendum)
Date of Admission:  02/04/2011  Date of Consult:  02/06/2011  Reason for Consult:Cellulitis Referring Physician: Sharin Mons  Impression/Recommendation Bacteremia- history of Cellulitis, history of Syncopal Episode Breast Cancer, s/p B mastectomy Thrush, intertriginous  Lyme-  Would- start diflucan. Await her BCx.  Continue vanco for now. Comment- I have low suspicion that she is still infected. I offered to change her to PO anbx but she was resistant to this.  Will check in her, f/u her BCx in the AM.  Would love to have her hx of lyme disease more clearly illuminated as it is not endemic to [facility].    HPI: [name] Carr is an 55 y.o. female with a history of DM2, SLE, breast cancer dx 08-2009 with mets to liver as well. She has received CTX for this. She underwent bilateral mastectomy October 2012 and her course was complicated by cellulitis. She was d/c home with IV vancomycin. She was re-admitted on 12/19/2010 with fever altered mental status (temp as high as 103.8). She was placed on broad-spectrum antibiotics. A CT scan of her chest was done on 12/21/2010 which showed bilateral chest wall cellulitis and a 12x4 centimeter fluid collection in the subcutaneous fat on the right. General surgery and her Port-A-Cath was removed on 12/23/18. 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 At her hospitalization in November her only positive BCx was P agglomerans (1/2). Her course was also complicated by L IJ DVT (12-25-10).  She was initially treated with broad anbx, then changed to cefepime then d/c home with IV cephalexin. She was thought to be injecting pain medications into her PIC line and this was therefore removed on 12-5. She was changed to po keflex at that time but states that this gave her oral ulcers.  She now returns12-28-12 after a syncopal episode in her cardiologists office. Her WBC was 11, which has since decreased into the normal range. She  has been afebrile in the hospital. But she states that she was having temps at home to 101.8.  Her repeat BCx are NGTD. She complains of sore tongue, oral thrush. Also, intertriginous erythema and yeast.   Past Medical History  Diagnosis Date  . PERSONALITY DISORDER   . Chronic pain syndrome     chronic narcotics, dependancy hx - [place] for pain mgmt  . MIGRAINE HEADACHE   . Lyme disease   . ALLERGIC RHINITIS   . ANEMIA-IRON DEFICIENCY   . HYPERLIPIDEMIA   . GERD   . DIABETES MELLITUS, TYPE II   . DEPRESSION   . CARCINOMA, BREAST 08/2009 dx    R breast,  mets to liver - resolution s/p chemo, s/p B mastect  . SLE (systemic lupus erythematosus)     r[facility]um at [facility] ([facility]) - chronic pred  . Psoriasis   . Addison's disease   . Metastases to the liver   . Degenerative joint disease   . DDD (degenerative disc disease)   . Breast cancer     bilateral mastectomy and has mets to liver  . Blood transfusion   . Phlebitis after infusion     left ?calf; "S/P phenergan/demerol injection"  . Blood clot in vein 2011; 02/04/11    "right jugular vein;left jugular vein"  . Angina   . Pleurisy     "alot; put me in hospital q time; related to my SLE"  . Shortness of breath 02/04/11    "always here lately"  . Hypothyroidism     "  took medicine while in hospital 08/2009 only"  . H/O hiatal hernia   . Stomach ulcer   . SEIZURE DISORDER     "lots of seizures; due to lupus"  . Ventilator dependent 1980's    "for 3 months S/P seizure"  . Anxiety   . Neuromuscular disorder   . Neuropathy     hands, feet, due to diabetes & back problem  . Herniated disc     "lumbar area w/bulging"  . Syncope and collapse 02/04/11    "not the first time either"    Past Surgical History  Procedure Date  . Liver bopsy 09/2009  . Port a cath placement 09/2009; 11/19/10  . Port-a-cath removal 04/2010    "due to staph & strept"  . Port-a-cath removal 12/23/2010    Procedure: REMOVAL PORT-A-CATH;  Surgeon: Valarie Merino, MD;  Location: [facility];  Service: General;  Laterality: Left;  . Breast surgery 09/2009    right breast biopsy  . Mastectomy 11/19/10    bilateral mastectomy   . I&d extremity ~ 2010    right hand; S/P "dog bite"  ergies:   Allergies  Allergen Reactions  . Methotrexate Anaphylaxis  . Versed Anaphylaxis  . Ammonia Other (See Comments)    Ended up on ventilator from ammonia tabs  . Contrast Media (Iodinated Diagnostic Agents) Other (See Comments)    Doesn't breath well.  . Infliximab Nausea And Vomiting  . Iohexol Other (See Comments)    SOB   . Keflex Nausea And Vomiting and Other (See Comments)    Blisters.  . Ketorolac Tromethamine Other (See Comments)    Injectable doesn't work and pill hurts stomach.  . Nalbuphine Other (See Comments)    Unknown.  . Nsaids Nausea And Vomiting  . Celecoxib Rash    Medications: I have reviewed the patient's current medications.  Social History:  reports that she has never smoked. She has never used smokeless tobacco. She reports that she does not drink alcohol or use illicit drugs.  Family History  Problem Relation Age of Onset  . Lung cancer Mother   . Lung cancer Father   . Arthritis Other   . Diabetes Other   . Hyperlipidemia Other     See HPI  Blood pressure 107/71, pulse 74, temperature 97.6 F (36.4 C), temperature source Oral, resp. rate 17, height 5\' 5"  (1.651 m), weight 91.8 kg (202 lb 6.1 oz), SpO2 93.00%. Eyes- EOMI, PERRL. Mouth- thrush. Neck- nontender.  CV- RRR. Chest- CTA. Chest Wall- non-tender. Her wounds are well healed. There is minimal peri-incisional erythema.  Abd- BS+, soft, non-tender. Extr- no edema. She has a peripheral IV over her L shoulder.   Results for orders placed during the hospital encounter of 02/04/11 (from the past 48 hour(s))  CBC     Status: Abnormal   Collection Time   02/04/11  3:22 PM      Component Value Range Comment   WBC 11.0 (*) 4.0 - 10.5 (K/uL)    RBC 3.92  3.87 - 5.11  (MIL/uL)    Hemoglobin 9.7 (*) 12.0 - 15.0 (g/dL)    HCT 04.5 (*) 40.9 - 46.0 (%)    MCV 79.8  78.0 - 100.0 (fL)    MCH 24.7 (*) 26.0 - 34.0 (pg)    MCHC 31.0  30.0 - 36.0 (g/dL)    RDW 81.1  91.4 - 78.2 (%)    Platelets 269  150 - 400 (K/uL)   BASIC METABOLIC PANEL  Status: Abnormal   Collection Time   02/04/11  3:22 PM      Component Value Range Comment   Sodium 135  135 - 145 (mEq/L)    Potassium 3.4 (*) 3.5 - 5.1 (mEq/L)    Chloride 92 (*) 96 - 112 (mEq/L)    CO2 31  19 - 32 (mEq/L)    Glucose, Bld 136 (*) 70 - 99 (mg/dL)    BUN 15  6 - 23 (mg/dL)    Creatinine, Ser 1.61  0.50 - 1.10 (mg/dL)    Calcium 9.7  8.4 - 10.5 (mg/dL)    GFR calc non Af Amer 60 (*) >90 (mL/min)    GFR calc Af Amer 70 (*) >90 (mL/min)   POCT I-STAT TROPONIN I     Status: Normal   Collection Time   02/04/11  3:35 PM      Component Value Range Comment   Troponin i, poc 0.00  0.00 - 0.08 (ng/mL)    Comment 3            APTT     Status: Normal   Collection Time   02/04/11  4:24 PM      Component Value Range Comment   aPTT 26  24 - 37 (seconds)   PROTIME-INR     Status: Normal   Collection Time   02/04/11  4:24 PM      Component Value Range Comment   Prothrombin Time 13.6  11.6 - 15.2 (seconds)    INR 1.02  0.00 - 1.49    CULTURE, BLOOD (ROUTINE X 2)     Status: Normal (Preliminary result)   Collection Time   02/04/11  4:40 PM      Component Value Range Comment   Specimen Description BLOOD HAND LEFT      Special Requests        Value: BOTTLES DRAWN AEROBIC AND ANAEROBIC 7CC AER 5CC ANA   Setup Time [id number]      Culture        Value:        BLOOD CULTURE RECEIVED NO GROWTH TO DATE CULTURE WILL BE HELD FOR 5 DAYS BEFORE ISSUING A FINAL NEGATIVE REPORT   Report Status PENDING     CULTURE, BLOOD (ROUTINE X 2)     Status: Normal (Preliminary result)   Collection Time   02/04/11  4:55 PM      Component Value Range Comment   Specimen Description BLOOD ARM RIGHT      Special Requests  BOTTLES DRAWN AEROBIC AND ANAEROBIC 5CC      Setup Time [id number]      Culture        Value:        BLOOD CULTURE RECEIVED NO GROWTH TO DATE CULTURE WILL BE HELD FOR 5 DAYS BEFORE ISSUING A FINAL NEGATIVE REPORT   Report Status PENDING     MRSA PCR SCREENING     Status: Normal   Collection Time   02/04/11 10:14 PM      Component Value Range Comment   MRSA by PCR NEGATIVE  NEGATIVE    TSH     Status: Normal   Collection Time   02/05/11  1:21 AM      Component Value Range Comment   TSH 3.428  0.350 - 4.500 (uIU/mL)   CARDIAC PANEL(CRET KIN+CKTOT+MB+TROPI)     Status: Normal   Collection Time   02/05/11  1:22 AM      Component Value Range Comment  Total CK 61  7 - 177 (U/L)    CK, MB 1.9  0.3 - 4.0 (ng/mL)    Troponin I <0.30  <0.30 (ng/mL)    Relative Index RELATIVE INDEX IS INVALID  0.0 - 2.5    CBC     Status: Abnormal   Collection Time   02/05/11  1:22 AM      Component Value Range Comment   WBC 9.1  4.0 - 10.5 (K/uL)    RBC 3.85 (*) 3.87 - 5.11 (MIL/uL)    Hemoglobin 9.5 (*) 12.0 - 15.0 (g/dL)    HCT 04.5 (*) 40.9 - 46.0 (%)    MCV 80.3  78.0 - 100.0 (fL)    MCH 24.7 (*) 26.0 - 34.0 (pg)    MCHC 30.7  30.0 - 36.0 (g/dL)    RDW 81.1 (*) 91.4 - 15.5 (%)    Platelets 256  150 - 400 (K/uL)   MAGNESIUM     Status: Normal   Collection Time   02/05/11  1:22 AM      Component Value Range Comment   Magnesium 1.8  1.5 - 2.5 (mg/dL)   BASIC METABOLIC PANEL     Status: Abnormal   Collection Time   02/05/11  6:24 AM      Component Value Range Comment   Sodium 137  135 - 145 (mEq/L)    Potassium 3.1 (*) 3.5 - 5.1 (mEq/L)    Chloride 95 (*) 96 - 112 (mEq/L)    CO2 31  19 - 32 (mEq/L)    Glucose, Bld 109 (*) 70 - 99 (mg/dL)    BUN 15  6 - 23 (mg/dL)    Creatinine, Ser 7.82 (*) 0.50 - 1.10 (mg/dL)    Calcium 9.1  8.4 - 10.5 (mg/dL)    GFR calc non Af Amer 50 (*) >90 (mL/min)    GFR calc Af Amer 58 (*) >90 (mL/min)   CBC     Status: Abnormal   Collection Time   02/05/11   6:24 AM      Component Value Range Comment   WBC 6.4  4.0 - 10.5 (K/uL)    RBC 4.85  3.87 - 5.11 (MIL/uL)    Hemoglobin 10.5 (*) 12.0 - 15.0 (g/dL)    HCT 95.6  21.3 - 08.6 (%)    MCV 80.8  78.0 - 100.0 (fL)    MCH 21.6 (*) 26.0 - 34.0 (pg)    MCHC 26.8 (*) 30.0 - 36.0 (g/dL)    RDW 57.8 (*) 46.9 - 15.5 (%)    Platelets 178  150 - 400 (K/uL)   CARDIAC PANEL(CRET KIN+CKTOT+MB+TROPI)     Status: Normal   Collection Time   02/05/11  9:50 AM      Component Value Range Comment   Total CK 57  7 - 177 (U/L)    CK, MB 2.0  0.3 - 4.0 (ng/mL)    Troponin I <0.30  <0.30 (ng/mL)    Relative Index RELATIVE INDEX IS INVALID  0.0 - 2.5    GLUCOSE, CAPILLARY     Status: Abnormal   Collection Time   02/05/11 12:43 PM      Component Value Range Comment   Glucose-Capillary 174 (*) 70 - 99 (mg/dL)   CARDIAC PANEL(CRET KIN+CKTOT+MB+TROPI)     Status: Normal   Collection Time   02/05/11  3:53 PM      Component Value Range Comment   Total CK 56  7 - 177 (U/L)  CK, MB 2.2  0.3 - 4.0 (ng/mL)    Troponin I <0.30  <0.30 (ng/mL)    Relative Index RELATIVE INDEX IS INVALID  0.0 - 2.5    CBC     Status: Abnormal   Collection Time   02/06/11  9:42 AM      Component Value Range Comment   WBC 6.5  4.0 - 10.5 (K/uL)    RBC 4.16  3.87 - 5.11 (MIL/uL)    Hemoglobin 10.2 (*) 12.0 - 15.0 (g/dL)    HCT 16.1 (*) 09.6 - 46.0 (%)    MCV 80.3  78.0 - 100.0 (fL)    MCH 24.5 (*) 26.0 - 34.0 (pg)    MCHC 30.5  30.0 - 36.0 (g/dL)    RDW 04.5  40.9 - 81.1 (%)    Platelets 272  150 - 400 (K/uL) DELTA CHECK NOTED  HEPATIC FUNCTION PANEL     Status: Abnormal   Collection Time   02/06/11  9:43 AM      Component Value Range Comment   Total Protein 7.2  6.0 - 8.3 (g/dL)    Albumin 3.5  3.5 - 5.2 (g/dL)    AST 14  0 - 37 (U/L)    ALT 15  0 - 35 (U/L)    Alkaline Phosphatase 85  39 - 117 (U/L)    Total Bilirubin 0.3  0.3 - 1.2 (mg/dL)    Bilirubin, Direct 0.1  0.0 - 0.3 (mg/dL)    Indirect Bilirubin 0.2 (*) 0.3 -  0.9 (mg/dL)   CANCER ANTIGEN 91.47     Status: Normal   Collection Time   02/06/11  9:43 AM      Component Value Range Comment   CA 27.29 25  0 - 39 (U/mL)       Component Value Date/Time   SDES BLOOD ARM RIGHT 02/04/2011 1655   SPECREQUEST BOTTLES DRAWN AEROBIC AND ANAEROBIC 5CC 02/04/2011 1655   CULT        BLOOD CULTURE RECEIVED NO GROWTH TO DATE CULTURE WILL BE HELD FOR 5 DAYS BEFORE ISSUING A FINAL NEGATIVE REPORT 02/04/2011 1655   REPTSTATUS PENDING 02/04/2011 1655   Dg Chest 2 View  02/04/2011  *RADIOLOGY REPORT*  Clinical Data: Chest pain.  Syncope.  Right internal jugular vein thrombosis related to a prior PICC.  CHEST - 2 VIEW 02/04/2011:  Comparison: Portable chest x-ray 12/23/2010, 06/20/2010 [facility] and two-view chest x-ray 09/05/2009 [facility].  Findings: Cardiomediastinal silhouette unremarkable and unchanged. Lungs clear.  Bronchovascular markings normal.  Pulmonary vascularity normal.  No pleural effusions.  Mild degenerative changes involving the thoracic spine.  No significant interval change.  IMPRESSION: No acute cardiopulmonary disease.  Stable examination.  Original Report Authenticated By: Arnell Sieving, M.D.   Nm Pulmonary Per & Vent  02/04/2011  *RADIOLOGY REPORT*  Clinical Data:  Syncope.  Right internal jugular vein thrombosis related to a prior PICC.  NUCLEAR MEDICINE VENTILATION - PERFUSION LUNG SCAN 02/04/2011:  Technique:  Wash-in, equilibrium, and wash-out phase ventilation images were obtained using Xe-133 gas.  Perfusion images were obtained in multiple projections after intravenous injection of Tc- 59m MAA.  Radiopharmaceuticals:  10 mCi Xe-133 gas and 6 mCi Tc-26m MAA.  Comparison:  None.  Findings: Ventilation images demonstrate normal wash-in and wash- out without evidence of significant air trapping.  Perfusion images demonstrate homogeneous perfusion throughout both lungs.  No segmental or subsegmental perfusion defects are  identified to suggest pulmonary embolism.  IMPRESSION:  Normal ventilation - perfusion lung scan.  Original Report Authenticated By: Arnell Sieving, M.D.    Thank you so much for this interesting consult,   Johny Sax 02/06/2011, 2:14 PM

## 2011-02-06 NOTE — Progress Notes (Signed)
CSW attempted x 2 to assess Pt.  Pt lethargic at first attempt and having a procedure at 2nd.  CSW continue to attepmt to address Pt needs. Milus Banister MSW,LCSW w/e Coverage [phone]

## 2011-02-06 NOTE — Progress Notes (Signed)
*  PRELIMINARY RESULTS*   Right upper extremity duplex done.  There is no deep vein thrombosis noted in the right jugular or subclavian veins.  The chronic partial DVT in the left IJV remains.    Sherren Kerns Renee 02/06/2011, 12:17 PM

## 2011-02-07 ENCOUNTER — Other Ambulatory Visit: Payer: Self-pay | Admitting: Oncology

## 2011-02-07 ENCOUNTER — Other Ambulatory Visit (HOSPITAL_COMMUNITY): Payer: Medicare Other

## 2011-02-07 DIAGNOSIS — I82C19 Acute embolism and thrombosis of unspecified internal jugular vein: Principal | ICD-10-CM

## 2011-02-07 DIAGNOSIS — R079 Chest pain, unspecified: Secondary | ICD-10-CM

## 2011-02-07 MED ORDER — HYDROMORPHONE HCL 4 MG PO TABS: 4.0000 mg | ORAL_TABLET | ORAL | Status: DC | PRN

## 2011-02-07 MED ORDER — TRAZODONE HCL 150 MG PO TABS: 150.0000 mg | ORAL_TABLET | Freq: Every day | ORAL | Status: DC

## 2011-02-07 MED ORDER — POTASSIUM CHLORIDE CRYS ER 20 MEQ PO TBCR: 20.0000 meq | EXTENDED_RELEASE_TABLET | Freq: Two times a day (BID) | ORAL | Status: DC

## 2011-02-07 NOTE — Consult Note (Signed)
CC: Valerie A. Felicity Coyer, MD Ranelle Oyster, M.D. Erick Colace, M.D. Tonye Royalty, MD Mathis Bud, MD   New patient evaluation for this 55 year old woman with known metastatic breast cancer who is changing oncologists from her former physician in [facility].     Ms. Balestrieri felt a lump in her right breast back in July of 2011.  I do not have initial mammogram studies at time of this dictation.  Clinically, the tumor was about 2.2 cm but on a CT scan of the chest done 10/02/2009, it measured 4 x 5 cm, lower outer quadrant.  Initial biopsies done 08/27/2009 showed a high-grade invasive ductal carcinoma which was triple negative for ER/PR and HER-2.  High proliferation rate with Ki-67 95%.  Borderline elevation of CA 15-3 tumor marker at 38 units.  A PET CT scan done 09/04/2009 showed, in addition, several small lesions in the posterior segment right lobe of liver, largest 12 x 6 mm.  A transcutaneous liver biopsy was done on 09/21/2009 and was positive for poorly differentiated adenocarcinoma.     A Port-A-Cath infusion device was placed and she was started on neoadjuvant chemotherapy initially with Cytoxan plus Taxotere.  She had 3 cycles with a dramatic response but poor tolerance and asked to change to a different chemotherapy.  Abraxane was started on 12/07/2009.  This was continued through 04/27/2010 with a 3 week on, 1 week rest schedule at a dose of 100 mg/sq m.  She elected to stop all treatments at that time.     She presented with abdominal pain, headache and confusion to the [facility] Emergency Department on 06/24/2010.  On that study, the liver appeared normal.  There was evidence for sigmoid colitis.  CT scan of the brain was negative for metastatic disease.     During the course of her initial chemotherapy treatment, she developed an infection of her Port-A-Cath and this had to be removed in April.   Per Dr. Mathis Bud' office notes, the primary tumor shrunk  down to about 2 cm following the initial chemotherapy.     Dr. Abbe Amsterdam discussed the possibility of going on oral chemotherapy with Xeloda with the patient.  The patient did not think that her insurance would cover this.   Past Medical History:    Ms. Aina has a number of chronic medical problems related to a diagnosis of lupus made when she was in her 23s, initially presenting with seizures and lupus cerebritis requiring hospitalization at [facility].  Details and records not available at time of this dictation.  She has been on chronic steroids, the current dose down to 10 mg daily.  She has had recurrent episodes of pericarditis and costochondritis.  She has polyarthralgia, polymyalgia, fatigue and a chronic pain syndrome now and she is now dependent on narcotic analgesics.  She tells me she was getting intermittent injections into the costochondral area for many years by her primary care physician, Dr. Debroah Loop, who has now retired.  For the last 3 years, she has been in a pain management clinic with Dr. Nilsa Nutting, an anesthesiologist pain specialist.  She is not satisfied with Dr. Nilsa Nutting because she feels she needs more pain medicine than he is prescribing.  She is currently taking 150 mcg of Duragesic transdermal every 3 days in addition to Dilaudid 2-mg tablets p.r.n.   She has already had a number of visits to the [facility] Emergency Department since transferring her care here in May for atypical chest  pain.  A CT angiogram of the chest was ordered on 06/24/2010, but there does not appear that the study was completed.  She had a ventilation perfusion nuclear medicine lung scan on the same day which was normal with no evidence for pulmonary emboli.  ADDITIONAL PAST MEDICAL HISTORY:  She believes she had an MI 2 months ago and was hospitalized briefly at [facility].  Essential hypertension.  Type 2 diabetes on oral agents for about 10 years.  Peptic ulcer disease with no history of  hematemesis.  Questionable hypothyroidism on medication in the past but now.  Chronic depression.  Recent cellulitis of the right breast.  Not clear if this was related to the Port-A-Cath infection or a separate event.  History of a clot in the internal jugular vein related to her Port-A-Cath, briefly on Lovenox.  History of MRSA infection of her right hand after a dog bite 5 years ago.  Chronic nausea and vomiting with previous GI evaluations through Dr. Vernell Barrier, gastroenterology in [facility].  Large bunion right foot.  Migraine headaches and she has seen Dr. Allayne Butcher, a neurologist in [facility], periodically.  She required multiple stitches in both of her arms for what she says there were lacerations related to metal cuts when she was trying to rescue her dog.  CURRENT MEDICATIONS:  Prednisone 10 mg daily; Prilosec 40 mg daily; Klonopin 1 mg daily?; gabapentin 900 mg t.i.d.?;  Aldactone 25 mg daily; hydrochlorothiazide 25 mg daily; Reglan 5 mg daily, higher doses cause involuntary jerking of her muscles; trazodone 150 mg h.s.; Dilaudid 2 mg p.r.n. pain; Duragesic 150 mcg q.3 days; Phenergan 25 mg p.r.n. nausea; Soma 350 mg p.r.n.; Phenergan rectal suppositories 25 mg p.r.n.; acidophilus 3 tablets daily; multivitamins 1 daily; oregano oil p.r.n.; potassium 1 tablet (10 mEq?) just increased to 4 times daily when her potassium came back to 2.7 on 09/23/2010 done prior to this visit last week; Cymbalta 60 mg daily.  ALLERGIES:  Multiple allergies including pentazocine, IV contrast with dyspnea, nalbuphine, methotrexate, infliximab, Toradol.  FAMILY HISTORY:  Both parents died in their early 32s of lung cancer, both were heavy smokers.  She has 1 brother age 76 living in [place] who is okay.  SOCIAL HISTORY:  Never married, no children.  She used to work as a Games developer.  She is a nonsmoker.  No alcohol.  REVIEW OF SYSTEMS:  Chronic pain syndrome affecting her back, her joints, her chest, her  breasts, her right upper quadrant.  Excess perspiration.  Intermittent migraine headaches.  Intermittent blurred vision, right eye.  No anorexia.  No dysphagia.  No dyspnea, no ischemic-type chest pain, occasional palpitations, intermittent abdominal pain, intermittent constipation on narcotics.  No hematochezia or melena.  Intermittent paresthesias of her hands and feet.  PHYSICAL EXAMINATION:  General:  Overweight Caucasian woman, 5 feet 5 inches, 210 pounds, blood pressure 119/74, pulse 92 regular, respirations 20, temperature 97.8.  Skin:  Pale.  Pupils equal, reactive to light.  Optic disks sharp on the left, not well visualized on the right.  Pharynx:  No erythema or exudate.  Vascular:  Carotids are 2+, no bruits.  Dorsalis pedis pulses are 2+, symmetric.  Lungs:  Clear and resonant to percussion.  Regular cardiac rhythm.  No murmur, gallop or rub.  She is large breasted.  Scar in the right breast from initial biopsy, scar right chest wall from Port-A-Cath which was recently removed, additional area of streaky hyperpigmentation in the upper sternum  also related to what looks like a tract infection of the Port-A-Cath.  Breasts are tender bilaterally with no erythema, no discharge.  No palpable mass.  No cervical, supraclavicular or axillary adenopathy.  Abdomen:  Soft and obese.  Tender in the right upper quadrant without palpable mass or organomegaly.  Neurologic:  Mental status intact.  Cranial nerves intact.  Motor strength 5/5.  Reflexes 2+, symmetric.  Sensation is significantly decreased to vibration by tuning fork exam over the fingertips.  LABORATORY DATA:  Lab done on 08/16, random glucose 115 in a known diabetic, BUN 16, creatinine 1.0.  Liver chemistries all normal, bilirubin 0.7, alkaline phosphatase 99, SGOT 13, SGPT 12, calcium 9.1 with albumin 4.1, LDH 205, CA 27-29 tumor marker 37 units, normal up to 39, hemoglobin 13, hematocrit 39, MCV 84, white count 11,000, 83% neutrophils, platelets  181,000.  IMPRESSION:   1. 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  Excellent response to neoadjuvant chemotherapy initially with Cytoxan and Taxotere x3 then Abraxane 3 weeks on, 1 week off through 04/27/2010 when the patient elected to stop all treatment.   We discussed issues related to her breast cancer first.  I do think it would be worthwhile to have a surgical re-evaluation to do a lumpectomy, although she would actually prefer a mastectomy,  to remove the primary site of the tumor.  Chemotherapy rarely works well on bulk disease at the primary site and if she has a progression at  the primary site, this can cause significant problems in the future with skin ulceration and pain.  She would like to have another Port-A-Cath put in due to poor vascular access.  I am going to refer her to [facility].   With respect to chemotherapy, I told her I did not think it would be a problem getting her Xeloda and she is willing to give this a trial.  Although we see higher responses with combination of Xeloda plus Navelbine, given her history of chronic nausea and vomiting and the fact that we plan to try an oral chemotherapy drug, I am going to start out with just single-agent Xeloda at 750 mg/sq m b.i.d. and escalate to maximum dose as tolerated.  I reviewed with her today the fact that we do not have curative therapy for advanced breast cancer at this point in time, but we do have a number of other treatment modalities that we can use in sequential fashion to keep the disease under control. 2. Systemic lupus with multiple acute and chronic complications.  She was seeing Dr. Lanell Matar at [facility], but this is now too far for her to travel and she does not  have  reliable transportation.  She does have the name of a good rheumatologist in [facility] and will get herself an appointment. 3. Chronic multifactorial pain  syndrome on chronic narcotic analgesics.     She asked me to admit her to the hospital today for pain control.  I told her that this is not my area of expertise.  She is already working with a pain specialist.  If she wants a second opinion from another pain specialist, I am happy to arrange this.  I do not plan to write her narcotic analgesics unless her pain is directly due to her metastatic breast cancer.  Right now there is no evidence that any of her pain is related to her cancer.  She accepted  my position and did not force the issue.      ______________________________ Levert Feinstein, M.D., F.A.C.P. JMG/MEDQ  D:  09/29/2010  T:  09/29/2010  Job:  [id number] Referring MD:  PCP:    Reason for Referral:  1.Advice on anti-coagulation S/P recent left, internal jugular thrombosis related to a porta-cath device. 2.  Provide background information re previous treatment for stage IV breast cancer.  Chief Complaint  Patient presents with  . Loss of Consciousness    carotid embolism left  . Chest Pain    HPI:  This is a complicated 55 year old Caucasian woman less often the first time on 10/03/10. Please see my complete office evaluation above for full details of her medical history. She was initially diagnosed with cancer of the right breast in July of 2011. Tumor was poorly differentiated and triple negative: Negative for estrogen receptor, progesterone receptor, and her-2. Staging evaluation showed small lesions on PET but not CT scan. These were biopsy-proven adenocarcinoma consistent with her breast primary. She was treated with neoadjuvant chemotherapy initially with Cytoxan and Taxotere for 3 cycles. She tolerated the chemotherapy poorly but had an excellent and complete radiographic response. She was changed to use single agent Abraxane which she took through 04/27/10 and again stop treatment at that point due to poor tolerance. She was discharged from the oncology practice in [facility] and  sought a second opinion with me. MRI of the breast done in September showed that she still had a 2.5 cm area of the tumor in the right breast. This was significantly decreased from 5 cm at diagnosis. I asked for a surgical opinion at that point to do something definitive about the primary. The patient was large breasted. She elected to have bilateral mastectomies. She underwent bilateral total mastectomies by Dr. been Dillard Essex on 11/19/10. She had placement of a left internal jugular Port-A-Cath infusion device. Unfortunately she developed an extensive cellulitis of the left chest wall following the surgery. A single blood culture was positive. Initial attempt was made at salvaging the Port-A-Cath. She received a prolonged course of IV antibiotics. However she presented again with fever and no gross infection of the Port-A-Cath and was readmitted to the hospital. The catheter was removed. She was put back on a long course of parenteral antibiotics. A right tunneled internal jugular catheter was placed on 11/18. At the time of that study she was determined to have a thrombus in the left internal jugular vein. She was started on anticoagulation but developed profuse epistaxis requiring nasal packing by ear nose and throat surgery. Anticoagulation was therefore discontinued. I saw the patient on officially at that time. I discussed the situation with the hospitalist and we mutually agreed that the risk of further anticoagulation exceed any small benefit. On the day of the current admission she was at lab our cardiology office to have a followup Doppler study of the left neck. I cannot find the record of this exam. The patient was told by the technical assistance at the clot in the internal jugular vein was stable and unchanged. While in the office, she became lightheaded and had a syncopal episode. She was admitted for further observation. Evaluation to date includes a nuclear medicine ventilation perfusion lung scan  which was normal and showed no evidence for acute pulmonary emboli.  Past Medical History  Diagnosis Date  . PERSONALITY DISORDER   . Chronic pain syndrome     chronic narcotics, dependancy hx - [place] for pain mgmt  .  MIGRAINE HEADACHE   . Lyme disease   . ALLERGIC RHINITIS   . ANEMIA-IRON DEFICIENCY   . HYPERLIPIDEMIA   . GERD   . DIABETES MELLITUS, TYPE II   . DEPRESSION   . CARCINOMA, BREAST 08/2009 dx    R breast,  mets to liver - resolution s/p chemo, s/p B mastect  . SLE (systemic lupus erythematosus)     [facility] - chronic pred  . Psoriasis   . Addison's disease   . Metastases to the liver   . Degenerative joint disease   . DDD (degenerative disc disease)   . Breast cancer     bilateral mastectomy and has mets to liver  . Blood transfusion   . Phlebitis after infusion     left ?calf; "S/P phenergan/demerol injection"  . Blood clot in vein 2011; 02/04/11    "right jugular vein;left jugular vein"  . Angina   . Pleurisy     "alot; put me in hospital q time; related to my SLE"  . Shortness of breath 02/04/11    "always here lately"  . Hypothyroidism     "took medicine while in hospital 08/2009 only"  . H/O hiatal hernia   . Stomach ulcer   . SEIZURE DISORDER     "lots of seizures; due to lupus"  . Ventilator dependent 1980's    "for 3 months S/P seizure"  . Anxiety   . Neuromuscular disorder   . Neuropathy     hands, feet, due to diabetes & back problem  . Herniated disc     "lumbar area w/bulging"  . Syncope and collapse 02/04/11    "not the first time either"  :  Past Surgical History  Procedure Date  . Liver bopsy 09/2009  . Port a cath placement 09/2009; 11/19/10  . Port-a-cath removal 04/2010    "due to staph & strept"  . Port-a-cath removal 12/23/2010    Procedure: REMOVAL PORT-A-CATH;  Surgeon: Valarie Merino, MD;  Location: [facility] ORS;  Service: General;  Laterality: Left;  . Breast surgery 09/2009    right breast biopsy  . Mastectomy  11/19/10    bilateral mastectomy   . I&d extremity ~ 2010    right hand; S/P "dog bite"  :     . docusate sodium  200 mg Oral BID  . DULoxetine  60 mg Oral BID  . enoxaparin  40 mg Subcutaneous QHS  . fentaNYL  150 mcg Transdermal Q72H  . fluconazole  100 mg Oral Daily  . gabapentin  900 mg Oral TID  . hydrochlorothiazide  25 mg Oral Daily  . pantoprazole  40 mg Oral Q1200  . potassium chloride  20 mEq Oral BID  . saccharomyces boulardii  250 mg Oral Daily  . sodium chloride  3 mL Intravenous Q12H  . spironolactone  25 mg Oral BID  . traZODone  150 mg Oral QHS  . DISCONTD: potassium chloride SA  20 mEq Oral BID  . DISCONTD: vancomycin  1,000 mg Intravenous Q12H  :  Allergies  Allergen Reactions  . Methotrexate Anaphylaxis  . Versed Anaphylaxis  . Ammonia Other (See Comments)    Ended up on ventilator from ammonia tabs  . Contrast Media (Iodinated Diagnostic Agents) Other (See Comments)    Doesn't breath well.  . Infliximab Nausea And Vomiting  . Iohexol Other (See Comments)    SOB   . Keflex Nausea And Vomiting and Other (See Comments)    Blisters.  Marland Kitchen  Ketorolac Tromethamine Other (See Comments)    Injectable doesn't work and pill hurts stomach.  . Nalbuphine Other (See Comments)    Unknown.  . Nsaids Nausea And Vomiting  . Celecoxib Rash  :  Family History  Problem Relation Age of Onset  . Lung cancer Mother   . Lung cancer Father   . Arthritis Other   . Diabetes Other   . Hyperlipidemia Other   :  History   Social History  . Marital Status: Single    Spouse Name: N/A    Number of Children: N/A  . Years of Education: N/A   Occupational History  . Not on file.   Social History Main Topics  . Smoking status: Never Smoker   . Smokeless tobacco: Never Used  . Alcohol Use: No  . Drug Use: No  . Sexually Active: No   Other Topics Concern  . Not on file   Social History Narrative  . No narrative on file  :  ROS: Eyes: Throat:  Neck: Resp:  No dyspnea  Cardio: No chest pain pressure or palpitations GI: Extremities:  Lymph nodes:  Neurologic:   Skin: . Genitourinary:  No recurrent epistaxis or other areas of bleeding.   Vitals: Filed Vitals:   02/07/11 0608  BP: 104/67  Pulse: 93  Temp: 98.4 F (36.9 C)  Resp: 19    PHYSICAL EXAM: General appearance: Overweight Caucasian woman no distress Head: Normal Eyes: Normal Throat: No erythema or exudate Neck: Full range of motion Lymph Nodes: No adenopathy Resp: Clear to auscultation resonant to percussion Cardio: Regular cardiac rhythm no murmur Breasts: Bilateral mastectomies. Chest wall now completely healed. Bilateral scars from  previous Port-A-Cath infusion devices which have been removed. GI: Soft nontender no mass no organomegaly GU: Not examined  Extremities: No edema no calf tenderness on our deviation of the digits Vascular: Carotids are 2+ no bruits no palpable cord in the left neck dorsalis pedis pulses 2+ symmetric Neurologic: Alert, oriented, pupils equal reactive to light, cranial nerves grossly intact, motor strength 5 over 5, reflexes 2+ symmetric Skin: No ecchymosis petechiae or rash  Labs:   [facility] 02/06/11 0942 02/05/11 0624  WBC 6.5 6.4  HGB 10.2* 10.5*  HCT 33.4* 39.2  PLT 272 178    Basename 02/05/11 0624 02/04/11 1522  NA 137 135  K 3.1* 3.4*  CL 95* 92*  CO2 31 31  GLUCOSE 109* 136*  BUN 15 15  CREATININE 1.19* 1.02  CALCIUM 9.1 9.7    Blood smear review:  Images Studies/Results:  No results found.   Pathology:   Assessment and Plan:  #1. One-month status post left internal jugular thrombosis related to a infected Port-A-Cath infusion device. Anticoagulation stopped during previous admission to  profuse epistaxis.  The clot has organized and poses minimal to no threat for extension at this point. Giiven her recent history of multiple complications I think that the risk of putting her back on anticoagulation exceeds  any potential small benefit and I would not recommend it.  #2. A triple negative cancer of the right breast treated as outlined above. Small liver metastases on the initial staging exam not visible subsequent to chemotherapy treatments. She had a brief trial of oral Xeloda chemotherapy through my office back in September. She also tolerated this poorly. She is undecided about whether or not to take any additional chemotherapy treatments. I told her that we could defer any restaging evaluation until such time she made that decision and  I will therefore not pursue a PET scan at this time. She has no vascular access. This will also be a problem in the near future and we will also have to readdress this if she makes a decision to proceed with additional chemotherapy treatments.  I will continue to follow her as an outpatient along with her other physicians. She has a followup with me next Friday, January 8.  Principal Problem:  *Syncope Active Problems:  Breats Ca, 08/2009 - hx liver mets, resolved s/p chemo  DIABETES MELLITUS, TYPE II  Chronic pain syndrome  RENAL INSUFFICIENCY  SEIZURE DISORDER  Chest pain  DVT L IJ 12/26/10  SLE-on prednisone-Follow at [facility]     GRANFORTUNA,JAMES M 02/07/2011, 2:24 PM

## 2011-02-07 NOTE — Progress Notes (Signed)
Patient [name] Carr. Kocourek is a 63 year white female struggling with stage 4 cancer.  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  Chaplain provided pastoral prayer, presence, and conversation; for which patient expressed appreciation.  Chaplain will follow-up as needed.

## 2011-02-07 NOTE — Progress Notes (Signed)
Pt scheduled for PET scan at [facility] today at 12pm. [facility] transport was set up and [facility] was expecting patient. When [facility] arrived, we discovered her IV had infiltrated within the last 30 minutes. Pt is a very hard stick and has had multiple portacaths and during this admission has had two shoulder IV's which were lost. Notified [facility] of situation and [place], [place] notified. Order for PICC line placed. IV team stated they might not be able to complete PICC until Wednesday. Will notify [place], [place]. [place], [employer]

## 2011-02-07 NOTE — Progress Notes (Signed)
Pt called out prior to leaving for D/C and said that she wanted to know what was wrong with her shoulder. I informed her that her shoulder was infiltrated and inflamed from the shoulder IV earlier. I decided to inform Dr. Mahala Menghini who came to look at the site once more. On assessment, the site is inflamed, reddened with scratch marks. Pt states she has been scratching it because it hurts. Dr. Mahala Menghini felt the site was okay and told the patient to place ice packs on the site and not to scratch it. Pt to be d/ced shortly. Driggers, [employer]

## 2011-02-07 NOTE — Progress Notes (Signed)
agre-see d/c summary Pleas Koch, MD [facility] [phone]

## 2011-02-07 NOTE — Progress Notes (Addendum)
Patient ID: [name] Carr, female   DOB: 1956-01-14, 55 y.o.   MRN: [id number]       [facility] progress note    Interval h/o:-  [name] Carr is an 55 y.o. female with a history of DM2, SLE, breast cancer dx 08-2009 with mets to liver as well. She has received CTX for this. She underwent bilateral mastectomy October 2012 and her course was complicated by cellulitis. She was d/c home with IV vancomycin.  She was re-admitted on 12/19/2010 with fever altered mental status (temp as high as 103.8). She was placed on broad-spectrum antibiotics. A CT scan of her chest was done on 12/21/2010 which showed bilateral chest wall cellulitis and a 12x4 centimeter fluid collection in the subcutaneous fat on the right. General surgery and her Port-A-Cath was removed on 12/23/18. 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 At her hospitalization in November her only positive BCx was P agglomerans (1/2). Her course was also complicated by L IJ DVT (12-25-10).  She was initially treated with broad anbx, then changed to cefepime then d/c home with IV cephalexin. She was thought to be injecting pain medications into her PIC line and this was therefore removed on 12-5. She was changed to po keflex at that time but states that this gave her oral ulcers.  She now returns12-28-12 after a syncopal episode in her cardiologists office. Her WBC was 11, which has since decreased into the normal range. She has been afebrile in the hospital. But she states that she was having temps at home to 101.8. Her repeat BCx are NGTD. She complains of sore tongue, oral thrush. Also, intertriginous erythema and yeast.  Evaluation in emergency room included a mild anemia with hemoglobin of 9.7, normal white count, negative cardiac markers, and a negative ventilation perfusion scan  Oncology has seen the patient and has ordered a PEt scan and another Doppler of the neck to determine the burden of  clot.   She has demonstrated significant pain needs while here necessitating IV dilaudid  02/07/11 - Per ID the blood cultures are negative and the patient no longer needs IV antibiotics.  IV Access was lost today consequently the PET scan could not be done.  I called Dr. Myna Carr who is very comfortable with the PET scan being done as an outpatient.  We are awaiting final results of the repeat carotid dopplers before the patient is discharged home.  Subjective:  No fever, no chills, no N/V. Describes relief that she no longer needs IV Antibiotics, does not need her PET scan as an inpatient and can go home tomorrow.  objective: Vital signs in last 24 hours: Temp:  [97.4 F (36.3 C)-98.4 F (36.9 C)] 98.4 F (36.9 C) (12/31 2956) Pulse Rate:  [83-93] 93  (12/31 0608) Resp:  [16-19] 19  (12/31 0608) BP: (104-129)/(57-78) 104/67 mmHg (12/31 0608) SpO2:  [93 %-95 %] 93 % (12/31 2130) Weight change:   Intake/Output Summary (Last 24 hours) at 02/07/11 1351 Last data filed at 02/07/11 0900  Gross per 24 hour  Intake 1192.5 ml  Output      0 ml  Net 1192.5 ml    BP 104/67  Pulse 93  Temp(Src) 98.4 F (36.9 C) (Oral)  Resp 19  Ht 5\' 5"  (1.651 m)  Wt 91.8 kg (202 lb 6.1 oz)  BMI 33.68 kg/m2  SpO2 93% General appearance: Alert and orientated, pleasant.  Applying hand lotion to  her arms and legs. Throat: lips, mucosa, and tongue normal; teeth and gums normal Lungs: clear to auscultation bilaterally and normal percussion bilaterally Breasts: Bilateral mastectomy-no fluctuance ofr erthema and clean scars Pulses: 2+ and symmetric Psych:  Demeanor appropriate, grooming excellent.  Lab Results:  [facility] 02/05/11 0624 02/05/11 0122 02/04/11 1522  NA 137 -- 135  K 3.1* -- 3.4*  CL 95* -- 92*  CO2 31 -- 31  GLUCOSE 109* -- 136*  BUN 15 -- 15  CREATININE 1.19* -- 1.02  CALCIUM 9.1 -- 9.7  MG -- 1.8 --  PHOS -- -- --    Basename 02/06/11 0942 02/05/11 0624  WBC 6.5 6.4    NEUTROABS -- --  HGB 10.2* 10.5*  HCT 33.4* 39.2  MCV 80.3 80.8  PLT 272 178    Basename 02/05/11 1553 02/05/11 0950 02/05/11 0122  CKTOTAL 56 57 61  CKMB 2.2 2.0 1.9  CKMBINDEX -- -- --  TROPONINI <0.30 <0.30 <0.30    Basename 02/05/11 0121  TSH 3.428  T4TOTAL --  T3FREE --  THYROIDAB --   Micro Results: Recent Results (from the past 240 hour(s))  CULTURE, BLOOD (ROUTINE X 2)     Status: Normal (Preliminary result)   Collection Time   02/04/11  4:40 PM      Component Value Range Status Comment   Specimen Description BLOOD HAND LEFT   Final    Special Requests     Final    Value: BOTTLES DRAWN AEROBIC AND ANAEROBIC 7CC AER 5CC ANA   Setup Time [id number]   Final    Culture     Final    Value:        BLOOD CULTURE RECEIVED NO GROWTH TO DATE CULTURE WILL BE HELD FOR 5 DAYS BEFORE ISSUING A FINAL NEGATIVE REPORT   Report Status PENDING   Incomplete   CULTURE, BLOOD (ROUTINE X 2)     Status: Normal (Preliminary result)   Collection Time   02/04/11  4:55 PM      Component Value Range Status Comment   Specimen Description BLOOD ARM RIGHT   Final    Special Requests BOTTLES DRAWN AEROBIC AND ANAEROBIC 5CC   Final    Setup Time [id number]   Final    Culture     Final    Value:        BLOOD CULTURE RECEIVED NO GROWTH TO DATE CULTURE WILL BE HELD FOR 5 DAYS BEFORE ISSUING A FINAL NEGATIVE REPORT   Report Status PENDING   Incomplete   MRSA PCR SCREENING     Status: Normal   Collection Time   02/04/11 10:14 PM      Component Value Range Status Comment   MRSA by PCR NEGATIVE  NEGATIVE  Final    Medications: I have reviewed the patient's current medications. Scheduled Meds:    . docusate sodium  200 mg Oral BID  . DULoxetine  60 mg Oral BID  . enoxaparin  40 mg Subcutaneous QHS  . fentaNYL  150 mcg Transdermal Q72H  . fluconazole  100 mg Oral Daily  . gabapentin  900 mg Oral TID  . hydrochlorothiazide  25 mg Oral Daily  . pantoprazole  40 mg Oral Q1200  .  potassium chloride SA  20 mEq Oral BID  . potassium chloride  20 mEq Oral BID  . saccharomyces boulardii  250 mg Oral Daily  . sodium chloride  3 mL Intravenous Q12H  . spironolactone  25 mg Oral  BID  . traZODone  150 mg Oral QHS  . DISCONTD: vancomycin  1,000 mg Intravenous Q12H   Continuous Infusions:  PRN Meds:.sodium chloride, carisoprodol, clonazePAM, HYDROmorphone, promethazine, sodium chloride, DISCONTD: HYDROmorphone, DISCONTD: HYDROmorphone   Assessment/Plan: Patient Active Hospital Problem List: Syncope (02/04/2011)   Assessment: unclear etiology-likely vasovagaled, as there was direct pressure over the Carotid., with one of the carotid arteries (left IJ) occluded with clot  Will hold further work-up for now given likely cause being in the L IJ  Breats Ca, 08/2009 - hx liver mets, resolved s/p chemo (09/07/2009)   Assessment:will follow with ON c as needed-S/p mastectomy in cot '12 A message has been left for Dr Patsy Lager office that she will need an out patient PET scan and office follow up with Dr. Reece Agar.  (January 8th, already scheduled)  DIABETES MELLITUS, TYPE II (09/07/2009)   Assessment: blood sugars optimal on admit  Chronic pain syndrome (09/07/2009)   Assessment: Review of old notes denotes signifcant concerns for self medication with IV pain meds. This admission:   ruled out for Cardiogenic/PE related pain.  Have attempted to explain that with 1 IV access only, the more we manipulate the IV, the more chances of losing the IV Somnolence is improved-she perseveres on only wishing IV pain meds States that the chemotherapy caused her significant burning of her abdomen/esophagus.  When asked if she has had a work-up for this, she states "i don't wish to be poked or prodded anymore"  SeiZURE DISORDER (09/07/2009)   Assessment: continue chronic med  Chest pain (02/04/2011)   Assessment: ruled out for Cardiogenic/PE related pain.  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  They feel she no longer needs any antibiotic therapy.    Further Onc (both Dr. Bea Laura and Dr. Reece Agar) report that she does not need anticoagulation for her IJ thrombosis.  SLE-on prednisone-Follow at [facility] (02/05/2011)   Assessment: Continue chronic prednisone  Hypokalemia-repalce orally (reordered 02/07/11) and recheck bmet in am.    DC in am with final results from Carotid Doppler.    LOS: 3 days   York, Kelcie Currie L 02/07/2011, 1:51 PM

## 2011-02-07 NOTE — Progress Notes (Signed)
Thurston Hole to be D/C'd Home per MD order.  Discussed with the patient and all questions fully answered.   Rotha, Cassels [facility]  Home Medication Instructions ZOX:[id number]   Printed on:02/07/11 1841  Medication Information                    docusate sodium (COLACE) 100 MG capsule Take 200 mg by mouth 2 (two) times daily.            Probiotic Product (PROBIOTIC FORMULA) CAPS Take 1 capsule by mouth daily.            omeprazole (PRILOSEC) 40 MG capsule Take 40 mg by mouth every morning.             fentaNYL (DURAGESIC - DOSED MCG/HR) 75 MCG/HR Place 2 patches onto the skin every 3 (three) days.            gabapentin (NEURONTIN) 300 MG capsule Take 3 capsules (900 mg total) by mouth 3 (three) times daily.           furosemide (LASIX) 20 MG tablet Take 1 tablet (20 mg total) by mouth daily.           DULoxetine (CYMBALTA) 60 MG capsule Take 1 capsule (60 mg total) by mouth 2 (two) times daily.           carisoprodol (SOMA) 350 MG tablet Take 350 mg by mouth 4 (four) times daily as needed. For pain.            EPINEPHrine (EPI-PEN) 0.3 mg/0.3 mL DEVI Inject 0.3 mg into the muscle once as needed. For bee stings.            potassium chloride SA (K-DUR,KLOR-CON) 20 MEQ tablet Take 20 mEq by mouth 2 (two) times daily. Or as directed            spironolactone-hydrochlorothiazide (ALDACTAZIDE) 25-25 MG per tablet Take 1 tablet by mouth 2 (two) times daily.             clonazePAM (KLONOPIN) 1 MG tablet Take 1 mg by mouth 2 (two) times daily as needed. For anxiety            HYDROmorphone (DILAUDID) 4 MG tablet Take 1 tablet (4 mg total) by mouth every 4 (four) hours as needed.           traZODone (DESYREL) 150 MG tablet Take 1 tablet (150 mg total) by mouth at bedtime.             Infiltrated IV site to right shoulder, see previous progress notes. Dr Mahala Menghini aware.   An After Visit Summary was printed and given to the patient. Patient escorted via WC, and D/C home via  private auto.  Driggers, Rae Roam 02/07/2011 6:41 PM

## 2011-02-07 NOTE — Telephone Encounter (Signed)
This encounter was created in error - please disregard.

## 2011-02-07 NOTE — Progress Notes (Signed)
INFECTIOUS DISEASE PROGRESS NOTE  ID: [name] Carr is a 55 y.o. female with   Principal Problem:  *Syncope Active Problems:  Breats Ca, 08/2009 - hx liver mets, resolved s/p chemo  DIABETES MELLITUS, TYPE II  Chronic pain syndrome  RENAL INSUFFICIENCY  SEIZURE DISORDER  Chest pain  DVT L IJ 12/26/10  SLE-on prednisone-Follow at [facility]  Subjective: 55 yo F with hx of recurrent port-a-cath infections (removed November 2012), breast cellulitis post B mastectomy, BCx (1/2) P aglomerans November 2012. Now back after syncopal episode, also with c/o thrush, candidiasis.  C/o weakness, aphthous ulcer.    Abtx:  Anti-infectives     Start     Dose/Rate Route Frequency Ordered Stop   02/06/11 1445   fluconazole (DIFLUCAN) tablet 100 mg        100 mg Oral Daily 02/06/11 1444 02/13/11 0959   02/04/11 2130   vancomycin (VANCOCIN) IVPB 1000 mg/200 mL premix        1,000 mg 200 mL/hr over 60 Minutes Intravenous Every 12 hours 02/04/11 2129     02/04/11 1645   vancomycin (VANCOCIN) IVPB 1000 mg/200 mL premix        1,000 mg 200 mL/hr over 60 Minutes Intravenous  Once 02/04/11 1633 02/04/11 1926          Medications:  I have reviewed the patient's current medications. Scheduled:   . docusate sodium  200 mg Oral BID  . DULoxetine  60 mg Oral BID  . enoxaparin  40 mg Subcutaneous QHS  . fentaNYL  150 mcg Transdermal Q72H  . fluconazole  100 mg Oral Daily  . gabapentin  900 mg Oral TID  . hydrochlorothiazide  25 mg Oral Daily  . pantoprazole  40 mg Oral Q1200  . potassium chloride SA  20 mEq Oral BID  . saccharomyces boulardii  250 mg Oral Daily  . sodium chloride  3 mL Intravenous Q12H  . spironolactone  25 mg Oral BID  . traZODone  150 mg Oral QHS  . vancomycin  1,000 mg Intravenous Q12H  . DISCONTD: hydrochlorothiazide  25 mg Oral BID    Objective: Vital signs in last 24 hours: Temp:  [97.4 F (36.3 C)-98.4 F (36.9 C)] 98.4 F (36.9 C) (12/31 1610) Pulse Rate:  [83-93]  93  (12/31 0608) Resp:  [16-19] 19  (12/31 0608) BP: (104-129)/(57-78) 104/67 mmHg (12/31 0608) SpO2:  [93 %-95 %] 93 % (12/31 9604)   General appearance: alert, cooperative and no distress Resp: clear to auscultation bilaterally Cardio: regular rate and rhythm, S1, S2 normal, no murmur, click, rub or gallop GI: normal findings: bowel sounds normal and soft, non-tender Extremities: edema none  Lab Results  Basename 02/06/11 0942 02/05/11 0624 02/04/11 1522  WBC 6.5 6.4 --  HGB 10.2* 10.5* --  HCT 33.4* 39.2 --  NA -- 137 135  K -- 3.1* 3.4*  CL -- 95* 92*  CO2 -- 31 31  BUN -- 15 15  CREATININE -- 1.19* 1.02  GLU -- -- --   Liver Panel  Basename 02/06/11 0943  PROT 7.2  ALBUMIN 3.5  AST 14  ALT 15  ALKPHOS 85  BILITOT 0.3  BILIDIR 0.1  IBILI 0.2*   Sedimentation Rate No results found for this basename: ESRSEDRATE in the last 72 hours C-Reactive Protein No results found for this basename: CRP:2 in the last 72 hours  Microbiology: Recent Results (from the past 240 hour(s))  CULTURE, BLOOD (ROUTINE X 2)  Status: Normal (Preliminary result)   Collection Time   02/04/11  4:40 PM      Component Value Range Status Comment   Specimen Description BLOOD HAND LEFT   Final    Special Requests     Final    Value: BOTTLES DRAWN AEROBIC AND ANAEROBIC 7CC AER 5CC ANA   Setup Time [id number]   Final    Culture     Final    Value:        BLOOD CULTURE RECEIVED NO GROWTH TO DATE CULTURE WILL BE HELD FOR 5 DAYS BEFORE ISSUING A FINAL NEGATIVE REPORT   Report Status PENDING   Incomplete   CULTURE, BLOOD (ROUTINE X 2)     Status: Normal (Preliminary result)   Collection Time   02/04/11  4:55 PM      Component Value Range Status Comment   Specimen Description BLOOD ARM RIGHT   Final    Special Requests BOTTLES DRAWN AEROBIC AND ANAEROBIC 5CC   Final    Setup Time [id number]   Final    Culture     Final    Value:        BLOOD CULTURE RECEIVED NO GROWTH TO DATE CULTURE  WILL BE HELD FOR 5 DAYS BEFORE ISSUING A FINAL NEGATIVE REPORT   Report Status PENDING   Incomplete   MRSA PCR SCREENING     Status: Normal   Collection Time   02/04/11 10:14 PM      Component Value Range Status Comment   MRSA by PCR NEGATIVE  NEGATIVE  Final     Studies/Results: No results found.   Assessment/Plan: Bacteremia Cellulitis Her repeat BCx are negative. Will d/c her vanco.  Lyme disease- I doubt she has this or had this. She states she was dx in [facility] by her PMD and has chronic lyme disease. I attempted to disuade her from this (there is no evidence to support the dx of chronic lyme disease).   Available as needed, thanks  Johny Sax Infectious Diseases 02/07/2011, 10:17 AM

## 2011-02-07 NOTE — Discharge Summary (Signed)
   I agree with the History/assesment & plan per Midlevel provider as per above Further details as follows: Significant pain issues will need close follow-up with dr. Ninfa Linden, Pain MD Defer Rx of IJ thrombus to Oncology-her syncope was likely 2/2 to croti compression. Her h/o of line issues have been resolved and will not likely require further Antibiotics-she is intolerant to many medicines. She ill need follow-up for her Lupus She exhibits depressive symptoms and should be seen holistically by pain management  Pleas Koch, MD [facility] [phone]

## 2011-02-07 NOTE — Discharge Summary (Signed)
Patient ID: [name] Carr MRN: [id number] DOB/AGE: 56-Sep-1957 56 y.o.  Admit date: 02/04/2011 Discharge date: 02/07/2011  Primary Care Physician:  Rene Paci, MD, MD Oncologist:  Dr. Cyndie Chime  Discharge Diagnoses:   Present on Admission:   Principal Problem:  *Syncope Active Problems:  Breats Ca, 08/2009 - hx liver mets, resolved s/p chemo  DIABETES MELLITUS, TYPE II  Chronic pain syndrome  RENAL INSUFFICIENCY  SEIZURE DISORDER  Chest pain  DVT L IJ 12/26/10  SLE-Follow at [facility]   Current Discharge Medication List    START taking these medications   Details  traZODone (DESYREL) 150 MG tablet Take 1 tablet (150 mg total) by mouth at bedtime. Qty: 7 tablet, Refills: 0      CONTINUE these medications which have CHANGED   Details  HYDROmorphone (DILAUDID) 4 MG tablet Take 1 tablet (4 mg total) by mouth every 4 (four) hours as needed. Qty: 40 tablet, Refills: 0      CONTINUE these medications which have NOT CHANGED   Details  carisoprodol (SOMA) 350 MG tablet Take 350 mg by mouth 4 (four) times daily as needed. For pain.     clonazePAM (KLONOPIN) 1 MG tablet Take 1 mg by mouth 2 (two) times daily as needed. For anxiety     docusate sodium (COLACE) 100 MG capsule Take 200 mg by mouth 2 (two) times daily.     DULoxetine (CYMBALTA) 60 MG capsule Take 1 capsule (60 mg total) by mouth 2 (two) times daily. Qty: 60 capsule, Refills: 6   Associated Diagnoses: Chronic pain syndrome    furosemide (LASIX) 20 MG tablet Take 1 tablet (20 mg total) by mouth daily. Qty: 30 tablet, Refills: 3   Associated Diagnoses: Edema    gabapentin (NEURONTIN) 300 MG capsule Take 3 capsules (900 mg total) by mouth 3 (three) times daily. Qty: 270 capsule, Refills: 3    omeprazole (PRILOSEC) 40 MG capsule Take 40 mg by mouth every morning.      potassium chloride SA (K-DUR,KLOR-CON) 20 MEQ tablet Take 20 mEq by mouth 2 (two) times daily. Or as directed     Probiotic Product (PROBIOTIC  FORMULA) CAPS Take 1 capsule by mouth daily.     spironolactone-hydrochlorothiazide (ALDACTAZIDE) 25-25 MG per tablet Take 1 tablet by mouth 2 (two) times daily.      EPINEPHrine (EPI-PEN) 0.3 mg/0.3 mL DEVI Inject 0.3 mg into the muscle once as needed. For bee stings.     fentaNYL (DURAGESIC - DOSED MCG/HR) 75 MCG/HR Place 2 patches onto the skin every 3 (three) days.       STOP taking these medications     hydrochlorothiazide (HYDRODIURIL) 25 MG tablet         Consults:  Infectious Disease, Dr. Ninetta Lights Oncology, Dr. Myna Hidalgo / Dr. Cyndie Chime  Brief H and P: From the admission note:  [name] Carr is an 55 y.o. female with multiple medical problems including breast cancer, status post bilateral mastectomy, recent left internal jugular thrombosis (about a month ago), recurrent nose bleed requiring cauterization and discontinuation of anticoagulation., Chronic pain syndrome and narcotic dependency, history of seizure disorder, diabetes, GERD, sent in from Dr. Areatha Keas office for sudden episode of loss of consciousness. She was having carotid Doppler done and had a "blackout". She denied any substernal chest pain, nausea, vomiting, diaphoresis, shortness of breath, bowel bladder incontinence, or any seizure activity. Evaluation in emergency room included a mild anemia with hemoglobin of 9.7, normal white count, negative cardiac markers, and a negative  ventilation perfusion scan. She requests pain medication several times. She also has history of MRSA line infection and has been on antibiotic, however being noncompliant, she has stopped medications. It should be noted that her hematologist/oncologist Dr. Marlena Clipper has tried Xarelto, but she was not able take it due to nausea.  Plan at the time of admission:  Will admit her to telemetry, rule out for MI with serial CPKs and troponins. I will give her some Dilaudid for pain control. With respect to her left internal jugular vein thrombosis,  will defer that decision of anticoagulation to Dr. Marlena Clipper. Currently she is stable. We'll continue her other medications, and place on cardiac monitor. With respect to her line infection, will resume vancomycin intravenously. She is stable, full code, and will be admitted to triad hospitalist service. Further pain management should be deferred to her pain specialist.  The patient's cardiac enzymes were cycled and found to be within normal limits ruling her out for acute coronary syndrome. She was seen by infectious disease for her history of MRSA and port infections.  They repeated blood cultures. Blood cultures were found to be negative. At that point her antibiotics were discontinued.    She was seen by oncology who originally ordered a PET scan. However the patient lost IV access during her hospitalization and the PET scan was unable to be completed. The patient was otherwise ready for discharge and it was decided that her PET scan to be done at a later date as an outpatient. Oncology saw the patient on the day of discharge. They clearly felt that anti-coagulation was unnecessary for her IJ thrombosis. Outpatient followup has been scheduled for January 8 with Dr. Cyndie Chime.    During her hospital stay the patient had no further episodes of syncope.  She was evaluated by physical therapy.  She was initially somewhat lethargic, but on the day of discharge the patient was alert, orientated, very pleasant and requesting that she be discharged to home.  After consultation between the Attending physician and Oncology it was decided that no further inpatient work up was necessary and the patient should be discharged.   Physical Exam on Discharge: General: Alert, awake, oriented x3, in no acute distress. HEENT: No bruits, no goiter. Heart: Regular rate and rhythm, without murmurs, rubs, gallops. Lungs: Clear to auscultation bilaterally. Abdomen: Soft, nontender, nondistended, positive bowel  sounds. Extremities: No clubbing cyanosis or edema with positive pedal pulses. Neuro: Grossly intact, nonfocal.  Filed Vitals:   02/06/11 0553 02/06/11 1414 02/06/11 2105 02/07/11 0608  BP: 107/71 104/57 129/78 104/67  Pulse: 74 84 83 93  Temp: 97.6 F (36.4 C) 98 F (36.7 C) 97.4 F (36.3 C) 98.4 F (36.9 C)  TempSrc: Oral Oral Axillary Oral  Resp: 17 16 18 19   Height:      Weight: 91.8 kg (202 lb 6.1 oz)     SpO2: 93% 93% 95% 93%     Intake/Output Summary (Last 24 hours) at 02/07/11 1519 Last data filed at 02/07/11 0900  Gross per 24 hour  Intake  952.5 ml  Output      0 ml  Net  952.5 ml    Basic Metabolic Panel:  Lab 02/05/11 1610 02/05/11 0122 02/04/11 1522  NA 137 -- 135  K 3.1* -- 3.4*  CL 95* -- 92*  CO2 31 -- 31  GLUCOSE 109* -- 136*  BUN 15 -- 15  CREATININE 1.19* -- 1.02  CALCIUM 9.1 -- 9.7  MG -- 1.8 --  PHOS -- -- --   Liver Function Tests:  Lab 02/06/11 0943  AST 14  ALT 15  ALKPHOS 85  BILITOT 0.3  PROT 7.2  ALBUMIN 3.5   CBC:  Lab 02/06/11 0942 02/05/11 0624  WBC 6.5 6.4  NEUTROABS -- --  HGB 10.2* 10.5*  HCT 33.4* 39.2  MCV 80.3 80.8  PLT 272 178   Cardiac Enzymes:  Lab 02/05/11 1553 02/05/11 0950 02/05/11 0122  CKTOTAL 56 57 61  CKMB 2.2 2.0 1.9  CKMBINDEX -- -- --  TROPONINI <0.30 <0.30 <0.30    CBG:  Lab 02/07/11 1136 02/06/11 1728 02/05/11 1243  GLUCAP 140* 127* 174*   Thyroid Function Tests:  Lab 02/05/11 0121  TSH 3.428  T4TOTAL --  FREET4 --  T3FREE --  THYROIDAB --   Coagulation:  Lab 02/04/11 1624  LABPROT 13.6  INR 1.02  Urine Drug Screen: Drugs of Abuse     Component Value Date/Time   LABOPIA POSITIVE* 03/08/2009 2244   COCAINSCRNUR NONE DETECTED 03/08/2009 2244   LABBENZ POSITIVE* 03/08/2009 2244   AMPHETMU NONE DETECTED 03/08/2009 2244   THCU NONE DETECTED 03/08/2009 2244   LABBARB  Value: NONE DETECTED        DRUG SCREEN FOR MEDICAL PURPOSES ONLY.  IF CONFIRMATION IS NEEDED FOR ANY PURPOSE,  NOTIFY LAB WITHIN 5 DAYS.        LOWEST DETECTABLE LIMITS FOR URINE DRUG SCREEN Drug Class       Cutoff (ng/mL) Amphetamine      1000 Barbiturate      200 Benzodiazepine   200 Tricyclics       300 Opiates          300 Cocaine          300 THC              50 03/08/2009 2244    Significant Diagnostic Studies:   Dg Chest 2 View  02/04/2011  *RADIOLOGY REPORT*  Clinical Data: Chest pain.  Syncope.  Right internal jugular vein thrombosis related to a prior PICC.  CHEST - 2 VIEW 02/04/2011:   Nm Pulmonary Per & Vent  02/04/2011  *RADIOLOGY REPORT*  Clinical Data:  Syncope.  Right internal jugular vein thrombosis related to a prior PICC.  NUCLEAR MEDICINE VENTILATION - PERFUSION LUNG SCAN 02/04/2011:   IMPRESSION: Normal ventilation - perfusion lung scan.    Disposition and Follow-up: Discharge to home in stable condition with oncology followup.  Time spent on Discharge: 40 min.  SignedStephani Police 02/07/2011, 3:19 PM [phone]

## 2011-02-07 NOTE — Progress Notes (Signed)
02/07/2011 [facility] Case Management Note [phone]   Utilization review completed.  

## 2011-02-09 ENCOUNTER — Ambulatory Visit (INDEPENDENT_AMBULATORY_CARE_PROVIDER_SITE_OTHER): Payer: Medicare Other | Admitting: Infectious Disease

## 2011-02-09 ENCOUNTER — Encounter: Payer: Self-pay | Admitting: Infectious Disease

## 2011-02-09 ENCOUNTER — Ambulatory Visit: Payer: Medicare Other | Admitting: Infectious Disease

## 2011-02-09 ENCOUNTER — Other Ambulatory Visit: Payer: Self-pay | Admitting: Licensed Clinical Social Worker

## 2011-02-09 VITALS — BP 130/77 | HR 92 | Temp 98.3°F | Wt 200.0 lb

## 2011-02-09 DIAGNOSIS — K1370 Unspecified lesions of oral mucosa: Secondary | ICD-10-CM

## 2011-02-09 DIAGNOSIS — L0390 Cellulitis, unspecified: Secondary | ICD-10-CM | POA: Insufficient documentation

## 2011-02-09 DIAGNOSIS — K121 Other forms of stomatitis: Secondary | ICD-10-CM

## 2011-02-09 DIAGNOSIS — L0291 Cutaneous abscess, unspecified: Secondary | ICD-10-CM

## 2011-02-09 MED ORDER — ACYCLOVIR 400 MG PO TABS: 800.0000 mg | ORAL_TABLET | Freq: Three times a day (TID) | ORAL | Status: DC

## 2011-02-09 MED ORDER — CEFTRIAXONE SODIUM 1 G IJ SOLR: 2.0000 g | INTRAMUSCULAR | Status: DC

## 2011-02-09 MED ORDER — CEFTRIAXONE SODIUM 1 G IJ SOLR: 2.0000 g | Freq: Every day | INTRAMUSCULAR | Status: AC

## 2011-02-09 NOTE — Progress Notes (Signed)
Subjective:    Patient ID: [name] Carr, female    DOB: 1955-08-16, 56 y.o.   MRN: [id number]  HPI  56 year old metastatic breast cancer with  portacath associated CNS bacteremia whom I saw at [facility] in November. She had recurrence of infection  After  failed attempts to save portacath. She was readmitted with fevers on antibiotics. One of two blood cultures at [facility] grew a Pantoea agglomerans (formerly known as enterobacter agglomerans), cultures here at [facility] from blood and from portacath after removal were negative. I had sent her home with IV vancomycin but then received calls from home infusion co that pt had been manipulating her central line and that it was now not functioning properly. They also informed me that the patient had a history of injecting crushed dilaudid via her portacath including an episode where she had been found unresponsive at home. She has history of recurrent line infections that per home infusion co are believed due to her manipulating the lines inappropriately. I had the picc line removed by IR and called in oral antibiotics for her. She never took these as "all oral antibiotics make me vomit." She was then readmitted to [facility] after syncope after carotid ultrasound. She apparently had infiltration of a shoulder IV at [facility]. She was seen by my partner Dr. Ninetta Lights during her admission not a few days ago and had repeat blood cultures done which were negative. She was noted to be picking and scratching at the site on her shoulder by RN on 5500 and this is noted in the epic charts. She was dc a few days ago. She came to clinic today to see me despite fact taht her visit had been cancelled and she was not on schedule. We worked her in. She had two main complaints  #1her shoulder is inflamed and hurts terribly much.  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 She became tearful and was upset at being considered to be a "liar." I assured her we would take care of her. I also informed her that I had had discussions with Dr. Johna Sheriff, Dr. Cyndie Chime about her and that Dr. Cyndie Chime had told me that he would be comfortable giving her chemotherapy through a central line (even in light of her reported history) and that he would be discussing this with the pt at her next visit.   I spent greater than 60 minutes with the patient including greater than 50% of time in face to face counsel of the patient and in coordination of their care.     Review of Systems  Constitutional: Negative for fever, chills, diaphoresis, activity change, appetite change, fatigue and unexpected weight change.  HENT: Negative for congestion, sore throat, rhinorrhea, sneezing, trouble swallowing and sinus pressure.   Eyes: Negative for photophobia and visual disturbance.  Respiratory: Negative for cough, chest tightness, shortness of breath, wheezing and stridor.   Cardiovascular: Negative for chest pain, palpitations and leg swelling.  Gastrointestinal: Negative for nausea, vomiting, abdominal pain, diarrhea, constipation, blood in stool, abdominal distention and anal bleeding.  Genitourinary: Negative for dysuria, hematuria, flank pain  and difficulty urinating.  Musculoskeletal: Negative for myalgias, back pain, joint swelling, arthralgias and gait problem.  Skin: Positive for color change, rash and wound. Negative for pallor.  Neurological: Positive for syncope. Negative for dizziness, tremors, weakness and light-headedness.  Hematological: Negative for adenopathy. Does not bruise/bleed  easily.  Psychiatric/Behavioral: Negative for behavioral problems, confusion, sleep disturbance, dysphoric mood, decreased concentration and agitation.       Objective:   Physical Exam  Constitutional: She is oriented to person, place, and time. She appears well-developed and well-nourished. No distress.  HENT:  Head: Normocephalic and atraumatic.  Mouth/Throat: Oropharynx is clear and moist. No oropharyngeal exudate.    Eyes: Conjunctivae and EOM are normal. Pupils are equal, round, and reactive to light. No scleral icterus.  Neck: Normal range of motion. Neck supple. No JVD present.  Cardiovascular: Normal rate, regular rhythm and normal heart sounds.  Exam reveals no gallop and no friction rub.   No murmur heard. Pulmonary/Chest: Effort normal and breath sounds normal. No respiratory distress. She has no wheezes. She has no rales. She exhibits no tenderness.  Abdominal: She exhibits no distension and no mass. There is no tenderness. There is no rebound and no guarding.  Musculoskeletal: She exhibits no edema and no tenderness.       She has area with clear center and areas of darkenin about it with intense erythema around this and exquisitely tender to palpation--though pain is a bit on dramatic side to my perception  Lymphadenopathy:    She has no cervical adenopathy.  Neurological: She is alert and oriented to person, place, and time. She has normal reflexes. She exhibits normal muscle tone. Coordination normal.  Skin: Skin is warm and dry. She is not diaphoretic. There is erythema. No pallor.  Psychiatric: She has a normal mood and affect. Her behavior is normal. Judgment and thought content normal.          Assessment & Plan:  Cellulitis I will give her IM rocephin 2 g daily for 10 days. She can apply warm compresses to the area. Infusion co will give the IM abx not pt herself. She will fu in 2 weeks time. If she fails this therapy will admit for consideration of I and D and  administration of anti MRSA antibiotics such as vancomycin or cubicin. I cannto find an anti MRSA antibiotic to be given IM and she claims she will vomit all oral antibiotics up no matter what  Oral ulcer Appear to be aphthous ulcers but will also give her acylcovir *(which she can take witthout vomiting)  PERSONALITY DISORDER Confronted her about her reported past. She stated that the "lies about her from Dr.s at [facility] had followed her.:" I encouraged her to continue to be cared for at [facility] an in particular told her she had strong advocate in Dr. Versie Starks Ca, 08/2009 - hx liver mets, resolved s/p chemo See above, Dr Cyndie Chime following her

## 2011-02-09 NOTE — Assessment & Plan Note (Signed)
See above, Dr Cyndie Chime following her

## 2011-02-09 NOTE — Assessment & Plan Note (Signed)
Appear to be aphthous ulcers but will also give her acylcovir *(which she can take witthout vomiting)

## 2011-02-09 NOTE — Assessment & Plan Note (Signed)
Confronted her about her reported past. She stated that the "lies about her from Dr.s at [facility] had followed her.:" I encouraged her to continue to be cared for at [facility] an in particular told her she had strong advocate in Dr. Cyndie Chime

## 2011-02-09 NOTE — Telephone Encounter (Signed)
Pt had wished for cheaper oral antibiotic in past few weeks apparently when seh was willing to try them

## 2011-02-09 NOTE — Assessment & Plan Note (Signed)
I will give her IM rocephin 2 g daily for 10 days. She can apply warm compresses to the area. Infusion co will give the IM abx not pt herself. She will fu in 2 weeks time. If she fails this therapy will admit for consideration of I and D and administration of anti MRSA antibiotics such as vancomycin or cubicin. I cannto find an anti MRSA antibiotic to be given IM and she claims she will vomit all oral antibiotics up no matter what

## 2011-02-10 ENCOUNTER — Other Ambulatory Visit: Payer: Self-pay | Admitting: Internal Medicine

## 2011-02-10 ENCOUNTER — Telehealth: Payer: Self-pay | Admitting: *Deleted

## 2011-02-10 NOTE — Telephone Encounter (Signed)
I spoke with Hilarie Fredrickson, RN at [facility]. They were trying to find out info on this pt. They had the wrong DOB. They needs to know where the infection was, who is getting the meds. I spoke with Tamika. She stated the pharmacy is delivering the antibiotic to pt's home today. Problem is in R shoulder. They do not have the staff to start the case today, but will do it in am. This is ok per Tamika. I faxed the requested info to [facility].

## 2011-02-11 ENCOUNTER — Other Ambulatory Visit: Payer: Medicare Other | Admitting: Lab

## 2011-02-11 ENCOUNTER — Telehealth: Payer: Self-pay | Admitting: *Deleted

## 2011-02-11 LAB — CULTURE, BLOOD (ROUTINE X 2)
Culture  Setup Time: 201212290125
Culture  Setup Time: 201212290125
Culture: NO GROWTH
Culture: NO GROWTH

## 2011-02-11 NOTE — Telephone Encounter (Signed)
Received vm call from pt. requesting labs ordered at our office today be done at [facility] in [facility] due to unable to get to our office today. Returned call [phone] & will r/s labs for next week at visit.  Pt. reminded to come at 1530 for lab before MD visit. Notified Scheduler/Rose to r/s & lab notified that she would not be here today.

## 2011-02-12 ENCOUNTER — Telehealth: Payer: Self-pay | Admitting: Oncology

## 2011-02-12 NOTE — Telephone Encounter (Signed)
Talked to pt , gave her appt for 02/15/11 lab and MD visit

## 2011-02-15 ENCOUNTER — Other Ambulatory Visit: Payer: Self-pay | Admitting: *Deleted

## 2011-02-15 ENCOUNTER — Encounter: Payer: Self-pay | Admitting: Oncology

## 2011-02-15 ENCOUNTER — Other Ambulatory Visit: Payer: Medicare Other | Admitting: Lab

## 2011-02-15 ENCOUNTER — Ambulatory Visit (HOSPITAL_BASED_OUTPATIENT_CLINIC_OR_DEPARTMENT_OTHER): Payer: Medicare Other | Admitting: Oncology

## 2011-02-15 VITALS — BP 110/68 | HR 81 | Temp 98.2°F | Ht 65.0 in | Wt 198.4 lb

## 2011-02-15 DIAGNOSIS — C50919 Malignant neoplasm of unspecified site of unspecified female breast: Secondary | ICD-10-CM

## 2011-02-15 DIAGNOSIS — T8149XA Infection following a procedure, other surgical site, initial encounter: Secondary | ICD-10-CM

## 2011-02-15 DIAGNOSIS — R52 Pain, unspecified: Secondary | ICD-10-CM

## 2011-02-15 DIAGNOSIS — IMO0002 Reserved for concepts with insufficient information to code with codable children: Secondary | ICD-10-CM | POA: Insufficient documentation

## 2011-02-15 DIAGNOSIS — C787 Secondary malignant neoplasm of liver and intrahepatic bile duct: Secondary | ICD-10-CM

## 2011-02-15 DIAGNOSIS — L03319 Cellulitis of trunk, unspecified: Secondary | ICD-10-CM

## 2011-02-15 DIAGNOSIS — G8929 Other chronic pain: Secondary | ICD-10-CM

## 2011-02-15 DIAGNOSIS — M329 Systemic lupus erythematosus, unspecified: Secondary | ICD-10-CM

## 2011-02-15 DIAGNOSIS — L02219 Cutaneous abscess of trunk, unspecified: Secondary | ICD-10-CM

## 2011-02-15 HISTORY — DX: Infection following a procedure, other surgical site, initial encounter: T81.49XA

## 2011-02-15 LAB — CBC WITH DIFFERENTIAL/PLATELET
Basophils Absolute: 0 10*3/uL (ref 0.0–0.1)
Eosinophils Absolute: 0 10*3/uL (ref 0.0–0.5)
HCT: 30.4 % — ABNORMAL LOW (ref 34.8–46.6)
HGB: 9.9 g/dL — ABNORMAL LOW (ref 11.6–15.9)
LYMPH%: 7.7 % — ABNORMAL LOW (ref 14.0–49.7)
MONO#: 0.2 10*3/uL (ref 0.1–0.9)
NEUT#: 10.7 10*3/uL — ABNORMAL HIGH (ref 1.5–6.5)
Platelets: 285 10*3/uL (ref 145–400)
RBC: 3.92 10*6/uL (ref 3.70–5.45)
WBC: 11.9 10*3/uL — ABNORMAL HIGH (ref 3.9–10.3)

## 2011-02-15 LAB — COMPREHENSIVE METABOLIC PANEL
Albumin: 3.8 g/dL (ref 3.5–5.2)
Alkaline Phosphatase: 98 U/L (ref 39–117)
BUN: 17 mg/dL (ref 6–23)
Creatinine, Ser: 1.04 mg/dL (ref 0.50–1.10)
Glucose, Bld: 172 mg/dL — ABNORMAL HIGH (ref 70–99)
Total Bilirubin: 0.2 mg/dL — ABNORMAL LOW (ref 0.3–1.2)

## 2011-02-15 MED ORDER — OXYCODONE-ACETAMINOPHEN 5-325 MG PO TABS
2.0000 | ORAL_TABLET | Freq: Once | ORAL | Status: AC
  Administered 2011-02-15: 2 via ORAL

## 2011-02-16 ENCOUNTER — Encounter (INDEPENDENT_AMBULATORY_CARE_PROVIDER_SITE_OTHER): Payer: Self-pay | Admitting: Surgery

## 2011-02-16 ENCOUNTER — Ambulatory Visit (INDEPENDENT_AMBULATORY_CARE_PROVIDER_SITE_OTHER): Payer: Medicare Other | Admitting: Surgery

## 2011-02-16 VITALS — BP 140/80 | HR 76 | Temp 96.9°F | Resp 18 | Ht 65.0 in | Wt 204.2 lb

## 2011-02-16 DIAGNOSIS — L02419 Cutaneous abscess of limb, unspecified: Secondary | ICD-10-CM

## 2011-02-16 DIAGNOSIS — IMO0002 Reserved for concepts with insufficient information to code with codable children: Secondary | ICD-10-CM

## 2011-02-16 NOTE — Patient Instructions (Signed)
The abscess area will need to be packed daily with a sterile 2X2 gauze. You may shower and then change the dressing.  We need to recheck this in about a week.

## 2011-02-16 NOTE — Progress Notes (Signed)
Hematology and Oncology Follow Up Visit  [name] Carr [id number] Nov 11, 1955 56 y.o. 02/16/2011 7:44 PM   Principle Diagnosis: Encounter Diagnoses  Name Primary?  . Breast cancer metastasized to liver Yes  . Surgical wound infection   . Abscess or cellulitis of chest wall      Interim History:   Alot has happened since her last visit here. A brief trial of oral Xeloda was not tolerated. Due to my concern that we needed to do something about the primary tumor in the right breast she was referred for a surgical opinion. She is large breasted. She had about a 50% reduction in the primary tumor following initial chemotherapy given by an oncologist in [facility]. She had been off all treatment for a number of months. She had a number of small liver lesions on initial PET scan which did not show up on a CT scan following her initial chemotherapy. She was evaluated by Dr. Odie Sera. She elected to have bilateral simple mastectomies. This was done on 11/19/2010. A Port-A-Cath infusion device was placed by a left internal jugular approach the same day. She was discharged on 11/22/2010 only to be readmitted with a extensive chest wall cellulitis on October 23 through October 31. She was treated with IV vancomycin and Zosyn. A single blood culture drawn at an outside hospital prior to admission was positive for Staphylococcus species. I discussed the case with Dr. Odie Sera. Given the prompt response to antibiotic therapy I thought that we could attempt to salvage the Port-A-Cath infusion device. She was readmitted on November 11 with confusion and fever of 103.8 per her report while on home IV vancomycin. She was seen in consultation by infectious disease. Recommendation was to remove the Port-A-Cath at that point. A right central line had been placed by a right internal jugular approach. During that hospital stay she was found to have a thrombosis of the left internal jugular vein when she developed left  upper extremity pain and swelling.Marland Kitchen She was started on anticoagulation but developed profuse epistaxis requiring your nose and throat surgeon to consult an place and nasal packing. I discussed the situation with the hospitalists. We mutually decided to discontinue all anticoagulation. On December 28 she went to lab our cardiology office as for a followup vascular Doppler study of the right internal jugular vein. While in the office she developed a near syncopal episode and was readmitted to the hospital. The Doppler study showed stable changes with no evidence for extension of the clot in the left internal jugular vein. There was no evidence for any acute cardiac damage. Final analysis was that she likely had a vasovagal attack. As incredible as it may seem, while hospitalized now for the third time she developed an area of cellulitis on the right shoulder. She believes that a peripheral IV line was placed in this area but this would be extremely unusual place to put a peripheral IV. She believes that she cannot tolerate any oral antibiotic to dig GI intolerance with nausea and vomiting. At that point her Hickman catheter had been removed from the right side. She was started on a course of IM Rocephin and has had 4 doses to date.  She reports today to my office for followup of her breast cancer. She is in significant pain from the soft tissue infection on her right shoulder and he was requesting parenteral narcotic analgesics. I gave her 2 Percocet. By mouth but she in for me that this would not work for  her.  Medications: reviewed  Allergies:  Allergies  Allergen Reactions  . Methotrexate Anaphylaxis  . Versed Anaphylaxis  . Ammonia Other (See Comments)    Ended up on ventilator from ammonia tabs  . Contrast Media (Iodinated Diagnostic Agents) Other (See Comments)    Doesn't breath well.  . Infliximab Nausea And Vomiting  . Iohexol Other (See Comments)    SOB   . Keflex Nausea And Vomiting  and Other (See Comments)    Blisters.  . Ketorolac Tromethamine Other (See Comments)    Injectable doesn't work and pill hurts stomach.  . Nalbuphine Other (See Comments)    Unknown.  . Nsaids Nausea And Vomiting  . Celecoxib Rash    Review of Systems: Constitutional:   Pain due to to active infection right shoulder currently no fever Respiratory: No dyspnea no cough Cardiovascular:  No chest pain or palpitations  Gastrointestinal: No abdominal pain Genito-Urinary: No dysuria or frequency Musculoskeletal: Chronic musculoskeletal pain Neurologic: No headache or change in vision  Skin: Infection right shoulder Remaining ROS negative.  Physical Exam: Blood pressure 110/68, pulse 81, temperature 98.2 F (36.8 C), temperature source Oral, height 5\' 5"  (1.651 m), weight 198 lb 6.4 oz (89.994 kg). Wt Readings from Last 3 Encounters:  02/16/11 204 lb 3.2 oz (92.625 kg)  02/15/11 198 lb 6.4 oz (89.994 kg)  02/09/11 200 lb (90.719 kg)     General appearance: Anxious obese Caucasian woman in pain HENNT: No erythema or exudate of the pharynx Lymph nodes: No adenopathy in the neck supraclavicular or axillary regions Breasts: Bilateral mastectomies. Chest wound now well healed and no erythema or exudate no skin breakdown Lungs: Clear to auscultation and resonant to percussion  Heart: No murmur gallop or rub Abdomen: Obese nontender  Extremities: No edema Vascular: No cyanosis  Neurologic: Grossly normal Skin: Large area of inflammation and nodular swelling ovary on approximate 8 cm area on the right shoulder with a necrotic center.  Lab Results: Lab Results  Component Value Date   WBC 11.9* 02/15/2011   HGB 9.9* 02/15/2011   HCT 30.4* 02/15/2011   MCV 77.6* 02/15/2011   PLT 285 02/15/2011     Chemistry      Component Value Date/Time   NA 136 02/15/2011 1521   K 3.4* 02/15/2011 1521   CL 93* 02/15/2011 1521   CO2 30 02/15/2011 1521   BUN 17 02/15/2011 1521   CREATININE 1.04 02/15/2011 1521       Component Value Date/Time   CALCIUM 9.4 02/15/2011 1521   ALKPHOS 98 02/15/2011 1521   AST 11 02/15/2011 1521   ALT 9 02/15/2011 1521   BILITOT 0.2* 02/15/2011 1521       Radiological Studies: @[id number] @   Impression and Plan: #1. 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 Excellent response to initial treatment with 3 cycles of Taxotere plus Cytoxan. Subsequent Abraxane given October 2011 through March 2012. Brief two-week trial of Xeloda given in our office in September 2012 stop do to poor GI tolerance. She initially declined all further chemotherapy but is considering going back on treatment. Due to multiple infectious complications outlined above and current active cellulitis on her right shoulder, any consideration for additional chemotherapy treatments will be postponed until all infections are under control.  #2 recurrent soft tissue infections initially of the chest wall and now the right shoulder. I believe this may relate to global immunosuppression  from underlying systemic lupus and chronic steroid use. She gives a history of multiple other complications related to her lupus including cerebritis with seizures, very carditis, costochondritis, chronic polyarthralgia polymyalgia fatigue and chronic pain resulting in narcotic dependence. New  The shoulder infection appears to be forming an abscess. There is necrotic tissue in the center of the inflammation. I told the patient she needs to get in touch with her surgeons immediately for further evaluation. I think she will likely need incision drainage and debridement. Continue the Rocephin. An emergency room physician gave her a prescription for oral clindamycin to add to the IM Rocephin. She just got this prescription last night when she went to the emergency department in [facility] because she was having pain in the shoulder and wanted narcotic  analgesics. A dose of dilaudid relieved the pain but only transiently. She has noted initiated I am clindamycin injections. I told her that I have never used I am clindamycin and would not do this until I checked with her infectious disease specialist. I did call Dr. Daiva Eves today.(January 9) and he does not feel she needs an additional antibiotic at this time. He agrees with surgical evaluation and I and D. of the wound. I received an e-mail a today from Dr. Thayer Ohm track. He evaluated the patient today (January 9) and did an incision and drainage procedure in his office. Of note the patient went back to another emergency room last night to get a dose of parenteral narcotics.  #3. Systemic lupus by history with multiple complications outlined above.  #4. 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  #5. Narcotic dependence.  #6. Chronic pain syndrome. We need to reinforced to her that only a single physician should be administering her pain medicine. She has been established with Dr. Nilsa Nutting an anesthesiologist pain specialists here in [place]. She needs to continue this relationship.  #7. Personality disorder  #8. 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 I would not recommend trying to put her back on anticoagulation at this time.  #9. Chronic nausea and vomiting.   CC:. Dr Wilfrid Lund; Vikki Ports leschber; K Dakwa; Natividad Brood;    Levert Feinstein, MD 1/9/20137:44 PM

## 2011-02-16 NOTE — Progress Notes (Signed)
CC: Access of the shoulder HPI: This patient has multiple problems with infections as she has been being treated for her breast cancer. She presented to the urgent office today with the area on the top of her right shoulder that has become infected. He has noticed some drainage and was in the emergency department last night where they obtained a culture and tried to open it a little bit. However she did have exquisite pain and tenderness and no to little bit of drainage. She is already on antibiotics.   ROS: See E. MR Notes  MEDS: Current Outpatient Prescriptions on File Prior to Visit  Medication Sig Dispense Refill  . acyclovir (ZOVIRAX) 400 MG tablet Take 2 tablets (800 mg total) by mouth 3 (three) times daily.  20 tablet  5  . carisoprodol (SOMA) 350 MG tablet TAKE 1 TABLET BY MOUTH FOUR TIMES DAILY AS NEEDED  120 tablet  0  . cefTRIAXone (ROCEPHIN) 1 G injection Inject 2 g into the muscle daily.  10 each  0  . clonazePAM (KLONOPIN) 1 MG tablet Take 1 mg by mouth 2 (two) times daily as needed. For anxiety       . docusate sodium (COLACE) 100 MG capsule Take 200 mg by mouth 2 (two) times daily.       . DULoxetine (CYMBALTA) 60 MG capsule Take 1 capsule (60 mg total) by mouth 2 (two) times daily.  60 capsule  6  . EPINEPHrine (EPI-PEN) 0.3 mg/0.3 mL DEVI Inject 0.3 mg into the muscle once as needed. For bee stings.       . fentaNYL (DURAGESIC - DOSED MCG/HR) 75 MCG/HR Place 2 patches onto the skin every 3 (three) days.       . furosemide (LASIX) 20 MG tablet Take 1 tablet (20 mg total) by mouth daily.  30 tablet  3  . gabapentin (NEURONTIN) 300 MG capsule TAKE 3 CAPSULES BY MOUTH THREE TIMES DAILY  810 capsule  1  . hydrochlorothiazide (HYDRODIURIL) 25 MG tablet Take 25 mg by mouth daily.        Marland Kitchen HYDROmorphone (DILAUDID) 4 MG tablet Take 1 tablet (4 mg total) by mouth every 4 (four) hours as needed.  40 tablet  0  . omeprazole (PRILOSEC) 40 MG capsule Take 40 mg by mouth every morning.         . potassium chloride SA (K-DUR,KLOR-CON) 20 MEQ tablet Take 20 mEq by mouth 2 (two) times daily. Or as directed       . predniSONE (DELTASONE) 10 MG tablet Take 10 mg by mouth daily.        . Probiotic Product (PROBIOTIC FORMULA) CAPS Take 1 capsule by mouth daily.       . promethazine (PHENERGAN) 25 MG tablet TAKE 1 TABLET BY MOUTH EVERY 4 HOURS AS NEEDED FOR NAUSEA AND VOMITING  40 tablet  0  . spironolactone-hydrochlorothiazide (ALDACTAZIDE) 25-25 MG per tablet Take 1 tablet by mouth 2 (two) times daily.        . traZODone (DESYREL) 150 MG tablet TAKE 2 TABLETS BY MOUTH EVERY NIGHT AT BEDTIME  180 tablet  1   Current Facility-Administered Medications on File Prior to Visit  Medication Dose Route Frequency Provider Last Rate Last Dose  . cefTRIAXone (ROCEPHIN) injection 2 g  2 g Intramuscular Daily Acey Lav, MD      . oxyCODONE-acetaminophen (PERCOCET) 5-325 MG per tablet 2 tablet  2 tablet Oral Once Levert Feinstein, MD   2 tablet  at 02/15/11 1745      ALLERGIES: Allergies  Allergen Reactions  . Methotrexate Anaphylaxis  . Versed Anaphylaxis  . Ammonia Other (See Comments)    Ended up on ventilator from ammonia tabs  . Contrast Media (Iodinated Diagnostic Agents) Other (See Comments)    Doesn't breath well.  . Infliximab Nausea And Vomiting  . Iohexol Other (See Comments)    SOB   . Keflex Nausea And Vomiting and Other (See Comments)    Blisters.  . Ketorolac Tromethamine Other (See Comments)    Injectable doesn't work and pill hurts stomach.  . Nalbuphine Other (See Comments)    Unknown.  . Nsaids Nausea And Vomiting  . Celecoxib Rash       PE General: The patient is alert oriented and with a little uncomfortable but not toxic Shoulder: On the top of the right shoulder is an area that appears to be necrotic skin. It is somewhat dumbbell shaped and about 1 x 3 cm in size. I think the underlying abscess or some good tissue.  Data Reviewed I have  reviewed the notes in the electronic medical record  Assessment Infection on top of the shoulder at old site of the IV (per patient)  Plan I think this area needs to be opened and debrided. I discussed that with the patient and she wished me to proceed.  Procedure note: The area the top of the shoulder was prepped with an alcohol and anesthetized with 1% Xylocaine with epinephrine. I then excised an area of skin and subcutaneous tissue that measured 1 x 3 cm. There is no underlying cavity that had necrotic material and I suspect she had some infiltration of antibiotic or other drug into the subcutaneous tissue causing this.  The area was then packed with a 2 x 2 and sterile dressing. The patient tolerated the procedure well. She understands this and LEEP daily dressing changes and we will try to arrange that with home health care. We will check this again in a week in the office for followup.

## 2011-02-17 ENCOUNTER — Telehealth: Payer: Self-pay | Admitting: *Deleted

## 2011-02-17 ENCOUNTER — Other Ambulatory Visit: Payer: Self-pay | Admitting: Oncology

## 2011-02-17 ENCOUNTER — Other Ambulatory Visit: Payer: Self-pay | Admitting: *Deleted

## 2011-02-17 DIAGNOSIS — C50919 Malignant neoplasm of unspecified site of unspecified female breast: Secondary | ICD-10-CM

## 2011-02-17 DIAGNOSIS — T8149XA Infection following a procedure, other surgical site, initial encounter: Secondary | ICD-10-CM

## 2011-02-17 MED ORDER — OMEPRAZOLE 40 MG PO CPDR: 40.0000 mg | DELAYED_RELEASE_CAPSULE | ORAL | Status: DC

## 2011-02-17 NOTE — Telephone Encounter (Signed)
Called pt, no answer, called [name] Carr of the contact left message for upcoming appt tonmorrow. She informed me that pt was in ER 2 night in a row.

## 2011-02-17 NOTE — Telephone Encounter (Signed)
Pt. Called & reports that Dr. Cyndie Chime asked her to call her surgeon & Dr. Jamey Ripa saw her & opened up the scab on her shoulder & it  has a deep crator that has to be packed daily wet to dry.  She would like to know about the clindamycin IM or if she can get her PICC back.  This will be discussed with Dr. Cyndie Chime.

## 2011-02-18 ENCOUNTER — Ambulatory Visit (HOSPITAL_COMMUNITY)
Admission: RE | Admit: 2011-02-18 | Discharge: 2011-02-18 | Disposition: A | Discharge status: Home / Self Care | Payer: Medicare Other | Source: Ambulatory Visit | Attending: Oncology | Admitting: Oncology

## 2011-02-18 ENCOUNTER — Other Ambulatory Visit: Payer: Self-pay | Admitting: Oncology

## 2011-02-18 ENCOUNTER — Encounter (HOSPITAL_COMMUNITY): Payer: Self-pay | Admitting: Physical Medicine and Rehabilitation

## 2011-02-18 ENCOUNTER — Other Ambulatory Visit: Payer: Self-pay

## 2011-02-18 ENCOUNTER — Emergency Department (HOSPITAL_COMMUNITY)
Admission: EM | Admit: 2011-02-18 | Discharge: 2011-02-18 | Disposition: A | Discharge status: Home / Self Care | Payer: Medicare Other | Attending: Emergency Medicine | Admitting: Emergency Medicine

## 2011-02-18 DIAGNOSIS — L02419 Cutaneous abscess of limb, unspecified: Secondary | ICD-10-CM

## 2011-02-18 DIAGNOSIS — T8131XA Disruption of external operation (surgical) wound, not elsewhere classified, initial encounter: Secondary | ICD-10-CM

## 2011-02-18 DIAGNOSIS — G894 Chronic pain syndrome: Secondary | ICD-10-CM | POA: Insufficient documentation

## 2011-02-18 DIAGNOSIS — K219 Gastro-esophageal reflux disease without esophagitis: Secondary | ICD-10-CM | POA: Insufficient documentation

## 2011-02-18 DIAGNOSIS — IMO0002 Reserved for concepts with insufficient information to code with codable children: Secondary | ICD-10-CM | POA: Insufficient documentation

## 2011-02-18 DIAGNOSIS — Z79899 Other long term (current) drug therapy: Secondary | ICD-10-CM | POA: Insufficient documentation

## 2011-02-18 DIAGNOSIS — C50919 Malignant neoplasm of unspecified site of unspecified female breast: Secondary | ICD-10-CM

## 2011-02-18 DIAGNOSIS — E785 Hyperlipidemia, unspecified: Secondary | ICD-10-CM | POA: Insufficient documentation

## 2011-02-18 DIAGNOSIS — F329 Major depressive disorder, single episode, unspecified: Secondary | ICD-10-CM | POA: Insufficient documentation

## 2011-02-18 DIAGNOSIS — R55 Syncope and collapse: Secondary | ICD-10-CM

## 2011-02-18 DIAGNOSIS — F3289 Other specified depressive episodes: Secondary | ICD-10-CM | POA: Insufficient documentation

## 2011-02-18 DIAGNOSIS — M009 Pyogenic arthritis, unspecified: Secondary | ICD-10-CM | POA: Insufficient documentation

## 2011-02-18 DIAGNOSIS — M25519 Pain in unspecified shoulder: Secondary | ICD-10-CM

## 2011-02-18 DIAGNOSIS — F411 Generalized anxiety disorder: Secondary | ICD-10-CM | POA: Insufficient documentation

## 2011-02-18 DIAGNOSIS — T8130XA Disruption of wound, unspecified, initial encounter: Secondary | ICD-10-CM | POA: Insufficient documentation

## 2011-02-18 DIAGNOSIS — Y849 Medical procedure, unspecified as the cause of abnormal reaction of the patient, or of later complication, without mention of misadventure at the time of the procedure: Secondary | ICD-10-CM | POA: Insufficient documentation

## 2011-02-18 DIAGNOSIS — E119 Type 2 diabetes mellitus without complications: Secondary | ICD-10-CM | POA: Insufficient documentation

## 2011-02-18 MED ORDER — DIPHENHYDRAMINE HCL 50 MG/ML IJ SOLN
25.0000 mg | Freq: Once | INTRAMUSCULAR | Status: AC
  Administered 2011-02-18: 25 mg via INTRAVENOUS
  Filled 2011-02-18: qty 1

## 2011-02-18 MED ORDER — ONDANSETRON HCL 4 MG/2ML IJ SOLN
4.0000 mg | Freq: Once | INTRAMUSCULAR | Status: AC
  Administered 2011-02-18: 4 mg via INTRAVENOUS
  Filled 2011-02-18: qty 2

## 2011-02-18 MED ORDER — HEPARIN SOD (PORK) LOCK FLUSH 100 UNIT/ML IV SOLN
250.0000 [IU] | INTRAVENOUS | Status: AC | PRN
  Administered 2011-02-18: 250 [IU]

## 2011-02-18 MED ORDER — SODIUM CHLORIDE 0.9 % IJ SOLN
10.0000 mL | INTRAMUSCULAR | Status: DC | PRN
  Administered 2011-02-18: 10 mL

## 2011-02-18 MED ORDER — HYDROMORPHONE HCL PF 2 MG/ML IJ SOLN
3.0000 mg | Freq: Once | INTRAMUSCULAR | Status: AC
  Administered 2011-02-18: 3 mg via INTRAVENOUS
  Filled 2011-02-18: qty 2

## 2011-02-18 MED ORDER — SODIUM CHLORIDE 0.9 % IV SOLN
INTRAVENOUS | Status: DC
  Administered 2011-02-18: 18:00:00 via INTRAVENOUS

## 2011-02-18 NOTE — ED Notes (Signed)
Pt presents to department from radiology for evaluation of syncope. Was sent here to verify PICC placement for home antibiotics. Per radiology staff pt passed out x2 today. She is alert and oriented x4 at the time. Roommate at bedside.

## 2011-02-18 NOTE — ED Notes (Signed)
Pt presents to department for evaluation of syncope. Per radiology staff and pt she passed out x2 today while in radiology after PICC line was placed. Pt states she became weak and then was assisted to floor when she passed out. Pt also states she has been falling recently at home and feels more weak than normal. Able to move all extremities without difficulty. No neuro deficits noted. She is alert and can answer questions appropriately. No signs of distress at the time. Vital signs stable.

## 2011-02-18 NOTE — ED Provider Notes (Addendum)
History     CSN: [id number]  Arrival date & time 02/18/11  1638   First MD Initiated Contact with Patient 02/18/11 1701      Chief Complaint  Patient presents with  . Loss of Consciousness    (Consider location/radiation/quality/duration/timing/severity/associated sxs/prior treatment) HPI [name] Carr is a 56 y.o. female presents with c/o syncope leading to desire to be assessed in the ED. The sx(s) have been present for 1 hour. Additional concerns are she was having pain in her right shoulder prior to passing out . She had just gotten a PICC line put in. Duration of syncope is unknown, but suspected to be only a minute . Causative factors are pain. Palliative factors are none. The distress associated is mild. The disorder has been present for 1 hour. The patient was admitted to the hospital on 01/27/2011 for syncope. She was, comprehensively evaluated, and no serious problems were found. During hospitalization, she developed an infection from a percutaneous IV. She was discharged home on at home treatments of IM Rocephin. She saw her ID doctor on 1-13 who continued the care for a total of 10 days. Her oncologist ordered a PICC line, which was put in today by interventional radiology. Patient relates having only a protein shake today. Prior to the procedure and not eating lunch.   Past Medical History  Diagnosis Date  . PERSONALITY DISORDER   . Chronic pain syndrome     chronic narcotics, dependancy hx - [facility] for pain mgmt  . MIGRAINE HEADACHE   . Lyme disease   . ALLERGIC RHINITIS   . ANEMIA-IRON DEFICIENCY   . HYPERLIPIDEMIA   . GERD   . DIABETES MELLITUS, TYPE II   . DEPRESSION   . CARCINOMA, BREAST 08/2009 dx    R breast,  mets to liver - resolution s/p chemo, s/p B mastect  . SLE (systemic lupus erythematosus)     [facility] [place] [facility] [place] - chronic pred  . Psoriasis   . Addison's disease   . Metastases to the liver   . Degenerative joint disease   . DDD (degenerative  disc disease)   . Breast cancer     bilateral mastectomy and has mets to liver  . Blood transfusion   . Phlebitis after infusion     left ?calf; "S/P phenergan/demerol injection"  . Blood clot in vein 2011; 02/04/11    "right jugular vein;left jugular vein"  . Angina   . Pleurisy     "alot; put me in hospital q time; related to my SLE"  . Shortness of breath 02/04/11    "always here lately"  . Hypothyroidism     "took medicine while in hospital 08/2009 only"  . H/O hiatal hernia   . Stomach ulcer   . SEIZURE DISORDER     "lots of seizures; due to lupus"  . Ventilator dependent 1980's    "for 3 months S/P seizure"  . Anxiety   . Neuromuscular disorder   . Neuropathy     hands, feet, due to diabetes & back problem  . Herniated disc     "lumbar area w/bulging"  . Syncope and collapse 02/04/11    "not the first time either"  . Surgical wound infection 02/15/2011  . Abscess or cellulitis of chest wall 02/15/2011    Past Surgical History  Procedure Date  . Liver bopsy 09/2009  . Port a cath placement 09/2009; 11/19/10  . Port-a-cath removal 04/2010    "due to staph & strept"  .  Port-a-cath removal 12/23/2010    Procedure: REMOVAL PORT-A-CATH;  Surgeon: Valarie Merino, MD;  Location: [facility];  Service: General;  Laterality: Left;  . Breast surgery 09/2009    right breast biopsy  . Mastectomy 11/19/10    bilateral mastectomy   . I&d extremity ~ 2010    right hand; S/P "dog bite"    Family History  Problem Relation Age of Onset  . Lung cancer Mother   . Lung cancer Father   . Arthritis Other   . Diabetes Other   . Hyperlipidemia Other     History  Substance Use Topics  . Smoking status: Never Smoker   . Smokeless tobacco: Never Used  . Alcohol Use: No    OB History    Grav Para Term Preterm Abortions TAB SAB Ect Mult Living                  Review of Systems  All other systems reviewed and are negative.    Allergies  Methotrexate; Versed; Ammonia; Contrast  media; Infliximab; Iohexol; Keflex; Ketorolac tromethamine; Nalbuphine; Nsaids; and Celecoxib  Home Medications   Current Outpatient Rx  Name Route Sig Dispense Refill  . ACYCLOVIR 400 MG PO TABS Oral Take 800 mg by mouth 3 (three) times daily.    Marland Kitchen CARISOPRODOL 350 MG PO TABS Oral Take 350-700 mg by mouth 4 (four) times daily as needed. For pain and spasms.    . CEFTRIAXONE SODIUM 1 G IJ SOLR Intramuscular Inject 2 g into the muscle daily.    Marland Kitchen CLONAZEPAM 1 MG PO TABS Oral Take 1 mg by mouth 2 (two) times daily as needed. For anxiety    . DOCUSATE SODIUM 100 MG PO CAPS Oral Take 200 mg by mouth 2 (two) times daily.     . DULOXETINE HCL 60 MG PO CPEP Oral Take 60 mg by mouth 2 (two) times daily.    Marland Kitchen EPINEPHRINE 0.3 MG/0.3ML IJ DEVI Intramuscular Inject 0.3 mg into the muscle once as needed. For bee stings.     . FENTANYL 100 MCG/HR TD PT72 Transdermal Place 1 patch onto the skin every 3 (three) days.    . FENTANYL 50 MCG/HR TD PT72 Transdermal Place 1 patch onto the skin every 3 (three) days.    . FUROSEMIDE 20 MG PO TABS Oral Take 20 mg by mouth daily.    Marland Kitchen GABAPENTIN 300 MG PO CAPS Oral Take 900 mg by mouth 3 (three) times daily.    Marland Kitchen HYDROCHLOROTHIAZIDE 25 MG PO TABS Oral Take 25 mg by mouth daily.      Marland Kitchen OMEPRAZOLE 40 MG PO CPDR Oral Take 40 mg by mouth every morning.    Marland Kitchen POTASSIUM CHLORIDE CRYS ER 20 MEQ PO TBCR Oral Take 20 mEq by mouth 2 (two) times daily. Or as directed     . PREDNISONE 10 MG PO TABS Oral Take 10 mg by mouth daily.      Marland Kitchen PROBIOTIC FORMULA PO CAPS Oral Take 1 capsule by mouth daily.     Marland Kitchen PROMETHAZINE HCL 25 MG PO TABS Oral Take 25 mg by mouth every 4 (four) hours as needed. For nausea.    Marland Kitchen SPIRONOLACTONE-HCTZ 25-25 MG PO TABS Oral Take 1 tablet by mouth 2 (two) times daily.      . TRAZODONE HCL 150 MG PO TABS Oral Take 300 mg by mouth at bedtime.     Marland Kitchen HYDROMORPHONE HCL 4 MG PO TABS Oral Take 4 mg by  mouth every 4 (four) hours as needed. For pain.      BP  120/63  Pulse 78  Temp(Src) 98.4 F (36.9 C) (Oral)  Resp 18  SpO2 97%  Physical Exam  Constitutional: She is oriented to person, place, and time. She appears well-developed and well-nourished.  HENT:  Head: Normocephalic and atraumatic.  Eyes: Conjunctivae and EOM are normal. Pupils are equal, round, and reactive to light.  Neck: Normal range of motion. Neck supple.  Cardiovascular: Normal rate and regular rhythm.   Pulmonary/Chest: Effort normal and breath sounds normal.       Status post right mastectomy  Abdominal: Soft.  Musculoskeletal: Normal range of motion.       PICC line in right upper arm, medial.  Neurological: She is alert and oriented to person, place, and time. She has normal reflexes.  Skin: Skin is warm and dry.       Right superior shoulder is a open wound, packed with gauze; there is mild surrounding erythema, but no discharge or fluctuance associated with the wound  Psychiatric:       She is anxious    ED Course  Procedures (including critical care time) Patient has not had a pain medicine since early this morning and would like to be treated in the ED with IV medication prior to leaving. A single dose was ordered. Labs Reviewed - No data to display Ir Fluoro Guide Cv Line Right  02/18/2011  *RADIOLOGY REPORT*  Clinical Data: Shoulder infection, status post I&D, access for IV antibiotics  PICC LINE PLACEMENT WITH ULTRASOUND AND FLUOROSCOPIC  GUIDANCE  Fluoroscopy Time: 0.3 minutes.  The right arm was prepped with chlorhexidine, draped in the usual sterile fashion using maximum barrier technique (cap and mask, sterile gown, sterile gloves, large sterile sheet, hand hygiene and cutaneous antisepsis) and infiltrated locally with 1% Lidocaine.  Ultrasound demonstrated patency of the right basilic vein, and this was documented with an image.  Under real-time ultrasound guidance, this vein was accessed with a 21 gauge micropuncture needle and image documentation was  performed.  The needle was exchanged over a guidewire for a peel-away sheath through which a 5 [place] single lumen PICC trimmed to 41 cm was advanced, positioned with its tip at the lower SVC/right atrial junction.  Fluoroscopy during the procedure and fluoro spot radiograph confirms appropriate catheter position.  The catheter was flushed, secured to the skin with Prolene sutures, and covered with a sterile dressing.  Complications:  No immediate  IMPRESSION: Successful right arm PICC line placement with ultrasound and fluoroscopic guidance.  The catheter is ready for use.  Original Report Authenticated By: Judie Petit. Ruel Favors, M.D.   Ir US Guide Vasc Access Right  02/18/2011  *RADIOLOGY REPORT*  Clinical Data: Shoulder infection, status post I&D, access for IV antibiotics  PICC LINE PLACEMENT WITH ULTRASOUND AND FLUOROSCOPIC  GUIDANCE  Fluoroscopy Time: 0.3 minutes.  The right arm was prepped with chlorhexidine, draped in the usual sterile fashion using maximum barrier technique (cap and mask, sterile gown, sterile gloves, large sterile sheet, hand hygiene and cutaneous antisepsis) and infiltrated locally with 1% Lidocaine.  Ultrasound demonstrated patency of the right basilic vein, and this was documented with an image.  Under real-time ultrasound guidance, this vein was accessed with a 21 gauge micropuncture needle and image documentation was performed.  The needle was exchanged over a guidewire for a peel-away sheath through which a 5 [place] single lumen PICC trimmed to 41 cm was advanced, positioned with  its tip at the lower SVC/right atrial junction.  Fluoroscopy during the procedure and fluoro spot radiograph confirms appropriate catheter position.  The catheter was flushed, secured to the skin with Prolene sutures, and covered with a sterile dressing.  Complications:  No immediate  IMPRESSION: Successful right arm PICC line placement with ultrasound and fluoroscopic guidance.  The catheter is ready for use.   Original Report Authenticated By: Judie Petit. Ruel Favors, M.D.   Date: 02/18/2011  Rate: 79  Rhythm: normal sinus rhythm  QRS Axis: normal  Intervals: normal  ST/T Wave abnormalities: normal  Conduction Disutrbances:none  Narrative Interpretation: possible RVH  Old EKG Reviewed: unchanged    1. Syncope   2. Shoulder pain   3. Wound disruption, post-op, skin       MDM  Evaluation consistent with vasovagal syncope. Patient has chronic pain and typically has difficulty getting her pain under control even in the inpatient setting. No further workup is indicated today for the recurrent syncope.        Flint Melter, MD 02/18/11 1732  Flint Melter, MD 02/18/11 [phone]

## 2011-02-18 NOTE — Procedures (Signed)
Successful RUE SL power PICC 41cm No comp Tip svc/ra Ready to use Full report dictated in PACS

## 2011-02-18 NOTE — ED Notes (Signed)
IV team paged to assess PICC line. 

## 2011-02-18 NOTE — ED Notes (Addendum)
Spoke with IV team, PICC line verified for use. Pt medicated for 8/10 R shoulder pain. Remains on cardiac monitor. Resting quietly at the time. Using bedpan without difficulty. Vital signs stable. Will continue to monitor.

## 2011-02-21 ENCOUNTER — Telehealth: Payer: Self-pay

## 2011-02-21 NOTE — Telephone Encounter (Signed)
Received call from Deanna, RN with [facility] requesting orders for daily PICC flushes and weekly PICC dressing changes.    Deanna also reports pt had her 10th dose of Rocephin 2g IM daily yesterday and questions if Dr Cyndie Chime has any new orders for IV antibiotics since PICC is in place.   Also of note - pt has was in the emergency room either here at [facility] or [facility] 5 times last week.  Note to Dr Cyndie Chime. dph

## 2011-02-21 NOTE — Telephone Encounter (Signed)
Deanna aware of new orders for Rocephin 2g IV daily x 4 doses to complete 14day course.  Orders faxed to [facility] pharmacy for Heparin flushes per protocol & Rocephin [phone]. dph

## 2011-02-22 ENCOUNTER — Encounter (HOSPITAL_COMMUNITY): Payer: Self-pay | Admitting: Emergency Medicine

## 2011-02-22 ENCOUNTER — Emergency Department (HOSPITAL_COMMUNITY)
Admission: EM | Admit: 2011-02-22 | Discharge: 2011-02-22 | Disposition: A | Discharge status: Home / Self Care | Payer: Medicare Other | Attending: Emergency Medicine | Admitting: Emergency Medicine

## 2011-02-22 DIAGNOSIS — L02419 Cutaneous abscess of limb, unspecified: Secondary | ICD-10-CM

## 2011-02-22 DIAGNOSIS — Z79899 Other long term (current) drug therapy: Secondary | ICD-10-CM | POA: Insufficient documentation

## 2011-02-22 DIAGNOSIS — IMO0002 Reserved for concepts with insufficient information to code with codable children: Secondary | ICD-10-CM | POA: Insufficient documentation

## 2011-02-22 DIAGNOSIS — E119 Type 2 diabetes mellitus without complications: Secondary | ICD-10-CM | POA: Insufficient documentation

## 2011-02-22 DIAGNOSIS — M25519 Pain in unspecified shoulder: Secondary | ICD-10-CM | POA: Insufficient documentation

## 2011-02-22 DIAGNOSIS — Z853 Personal history of malignant neoplasm of breast: Secondary | ICD-10-CM | POA: Insufficient documentation

## 2011-02-22 MED ORDER — SODIUM CHLORIDE 0.9 % IV SOLN
Freq: Once | INTRAVENOUS | Status: AC
  Administered 2011-02-22: 50 mL via INTRAVENOUS

## 2011-02-22 MED ORDER — ONDANSETRON HCL 4 MG/2ML IJ SOLN
4.0000 mg | Freq: Once | INTRAMUSCULAR | Status: AC
  Administered 2011-02-22: 4 mg via INTRAVENOUS
  Filled 2011-02-22: qty 2

## 2011-02-22 MED ORDER — HYDROMORPHONE HCL PF 2 MG/ML IJ SOLN
2.0000 mg | Freq: Once | INTRAMUSCULAR | Status: AC
  Administered 2011-02-22: 2 mg via INTRAVENOUS
  Filled 2011-02-22: qty 1

## 2011-02-22 NOTE — ED Notes (Signed)
Pt reports cellulitis on r/shoulder.  Recent PICC line insertion

## 2011-02-22 NOTE — ED Provider Notes (Signed)
Medical screening examination/treatment/procedure(s) were conducted as a shared visit with non-physician practitioner(s) and myself.  I personally evaluated the patient during the encounter  Wound appears to have healthy granulation tissue at the base.  No sign of recurrent infection.  Celene Kras, MD 02/22/11 [phone]

## 2011-02-22 NOTE — ED Provider Notes (Signed)
History     CSN: [id number]  Arrival date & time 02/22/11  1020   First MD Initiated Contact with Patient 02/22/11 1047      Chief Complaint  Patient presents with  . Shoulder Pain    Hx of recurrent r/shoulder pain.    (Consider location/radiation/quality/duration/timing/severity/associated sxs/prior treatment) Patient is a 56 y.o. female presenting with shoulder pain. The history is provided by the patient.  Shoulder Pain This is a new problem. The current episode started in the past 7 days. The problem occurs constantly. The problem has been unchanged. Pertinent negatives include no chills or fever.  Pt states she had an abscess I&ded a week ago by Dr. Johna Sheriff on infected IV site area. Pt states since then the wound was left open and she is getting wet to dry treatments. States appointment with Dr. Johna Sheriff in 2 days. SInce then she has been unable to keep her pain under control. States she is taking 4mg  dilaudid and has fentanyl patches but no pain relief. She has been going from ER to ER getting more pain medications until she is able to see Dr. Johna Sheriff. States abscess is looking better. Denies fever, chills, malaise.  Past Medical History  Diagnosis Date  . PERSONALITY DISORDER   . Chronic pain syndrome     chronic narcotics, dependancy hx - [place] for pain mgmt  . MIGRAINE HEADACHE   . Lyme disease   . ALLERGIC RHINITIS   . ANEMIA-IRON DEFICIENCY   . HYPERLIPIDEMIA   . GERD   . DIABETES MELLITUS, TYPE II   . DEPRESSION   . CARCINOMA, BREAST 08/2009 dx    R breast,  mets to liver - resolution s/p chemo, s/p B mastect  . SLE (systemic lupus erythematosus)     [place] - chronic pred  . Psoriasis   . Addison's disease   . Metastases to the liver   . Degenerative joint disease   . DDD (degenerative disc disease)   . Breast cancer     bilateral mastectomy and has mets to liver  . Blood transfusion   . Phlebitis after infusion     left ?calf; "S/P  phenergan/demerol injection"  . Blood clot in vein 2011; 02/04/11    "right jugular vein;left jugular vein"  . Angina   . Pleurisy     "alot; put me in hospital q time; related to my SLE"  . Shortness of breath 02/04/11    "always here lately"  . Hypothyroidism     "took medicine while in hospital 08/2009 only"  . H/O hiatal hernia   . Stomach ulcer   . SEIZURE DISORDER     "lots of seizures; due to lupus"  . Ventilator dependent 1980's    "for 3 months S/P seizure"  . Anxiety   . Neuromuscular disorder   . Neuropathy     hands, feet, due to diabetes & back problem  . Herniated disc     "lumbar area w/bulging"  . Syncope and collapse 02/04/11    "not the first time either"  . Surgical wound infection 02/15/2011  . Abscess or cellulitis of chest wall 02/15/2011    Past Surgical History  Procedure Date  . Liver bopsy 09/2009  . Port a cath placement 09/2009; 11/19/10  . Port-a-cath removal 04/2010    "due to staph & strept"  . Port-a-cath removal 12/23/2010    Procedure: REMOVAL PORT-A-CATH;  Surgeon: Valarie Merino, MD;  Location: [facility];  Service:  General;  Laterality: Left;  . Breast surgery 09/2009    right breast biopsy  . Mastectomy 11/19/10    bilateral mastectomy   . I&d extremity ~ 2010    right hand; S/P "dog bite"  . Peripherally inserted central catheter insertion     Family History  Problem Relation Age of Onset  . Lung cancer Mother   . Lung cancer Father   . Arthritis Other   . Diabetes Other   . Hyperlipidemia Other     History  Substance Use Topics  . Smoking status: Never Smoker   . Smokeless tobacco: Never Used  . Alcohol Use: No    OB History    Grav Para Term Preterm Abortions TAB SAB Ect Mult Living                  Review of Systems  Constitutional: Negative for fever and chills.  HENT: Negative.   Eyes: Negative.   Respiratory: Negative.   Cardiovascular: Negative.   Gastrointestinal: Negative.   Genitourinary: Negative.     Musculoskeletal: Negative.   Skin: Positive for wound.  Neurological: Negative.   Psychiatric/Behavioral: Negative.     Allergies  Methotrexate; Versed; Ammonia; Contrast media; Infliximab; Iohexol; Keflex; Ketorolac tromethamine; Nalbuphine; Nsaids; and Celecoxib  Home Medications   Current Outpatient Rx  Name Route Sig Dispense Refill  . CARISOPRODOL 350 MG PO TABS Oral Take 350-700 mg by mouth 4 (four) times daily as needed. For pain and spasms.    . CEFTRIAXONE SODIUM 1 G IJ SOLR Intramuscular Inject 2 g into the muscle daily.    Marland Kitchen CLONAZEPAM 1 MG PO TABS Oral Take 1 mg by mouth 2 (two) times daily as needed. For anxiety    . DOCUSATE SODIUM 100 MG PO CAPS Oral Take 200 mg by mouth 2 (two) times daily.     . DULOXETINE HCL 60 MG PO CPEP Oral Take 60 mg by mouth 2 (two) times daily.    . FENTANYL 100 MCG/HR TD PT72 Transdermal Place 1 patch onto the skin every 3 (three) days.    . FENTANYL 50 MCG/HR TD PT72 Transdermal Place 1 patch onto the skin every 3 (three) days.    . FUROSEMIDE 20 MG PO TABS Oral Take 20 mg by mouth daily.    Marland Kitchen GABAPENTIN 300 MG PO CAPS Oral Take 900 mg by mouth 3 (three) times daily.    Marland Kitchen HYDROCHLOROTHIAZIDE 25 MG PO TABS Oral Take 25 mg by mouth daily.      Marland Kitchen OMEPRAZOLE 40 MG PO CPDR Oral Take 40 mg by mouth every morning.    Marland Kitchen POTASSIUM CHLORIDE CRYS ER 20 MEQ PO TBCR Oral Take 20 mEq by mouth 2 (two) times daily. Or as directed     . PREDNISONE 10 MG PO TABS Oral Take 10 mg by mouth daily.      Marland Kitchen PROBIOTIC FORMULA PO CAPS Oral Take 1 capsule by mouth daily.     Marland Kitchen PROMETHAZINE HCL 25 MG PO TABS Oral Take 25 mg by mouth every 4 (four) hours as needed. For nausea.    Marland Kitchen SPIRONOLACTONE-HCTZ 25-25 MG PO TABS Oral Take 1 tablet by mouth 2 (two) times daily.      . TRAZODONE HCL 150 MG PO TABS Oral Take 150 mg by mouth at bedtime.     Marland Kitchen EPINEPHRINE 0.3 MG/0.3ML IJ DEVI Intramuscular Inject 0.3 mg into the muscle once as needed. For bee stings.     Marland Kitchen  HYDROMORPHONE  HCL 4 MG PO TABS Oral Take 4 mg by mouth every 4 (four) hours as needed. For pain.      BP 141/67  Pulse 90  Temp(Src) 98.7 F (37.1 C) (Oral)  Resp 18  SpO2 97%  Physical Exam  Nursing note and vitals reviewed. Constitutional: She is oriented to person, place, and time. She appears well-developed and well-nourished.       Appears sedated, but alert  HENT:  Head: Normocephalic.  Eyes: Conjunctivae are normal. Pupils are equal, round, and reactive to light.  Neck: Neck supple.  Cardiovascular: Normal rate, regular rhythm and normal heart sounds.   Pulmonary/Chest: Effort normal and breath sounds normal. No respiratory distress.       Double mastectomy  Abdominal: Soft. Bowel sounds are normal. There is no tenderness.  Musculoskeletal: Normal range of motion.  Neurological: She is alert and oriented to person, place, and time.  Skin: Skin is warm and dry.       3cmx4cm open wound to the right shoulder, tissue appears to be healing well, granulated, pink, Small area of blackened tissue on the edge, no drainage, no surrounding cellulitis.  Psychiatric: She has a normal mood and affect.    ED Course  Procedures (including critical care time)  Pt requiesting pain medications. Will give dose in ED, and follow up outpatient. Wound healing well. Dressing changed to sterile wet to dry by me. VS normal.   No diagnosis found.    MDM          Lottie Mussel, PA 02/22/11 1141

## 2011-02-23 ENCOUNTER — Emergency Department (HOSPITAL_COMMUNITY)
Admission: EM | Admit: 2011-02-23 | Discharge: 2011-02-23 | Disposition: A | Discharge status: Home / Self Care | Payer: Medicare Other | Attending: Emergency Medicine | Admitting: Emergency Medicine

## 2011-02-23 ENCOUNTER — Encounter (HOSPITAL_COMMUNITY): Payer: Self-pay

## 2011-02-23 ENCOUNTER — Encounter: Payer: Self-pay | Admitting: Infectious Disease

## 2011-02-23 ENCOUNTER — Encounter: Payer: Self-pay | Admitting: Internal Medicine

## 2011-02-23 ENCOUNTER — Ambulatory Visit (INDEPENDENT_AMBULATORY_CARE_PROVIDER_SITE_OTHER): Payer: Medicare Other | Admitting: Infectious Disease

## 2011-02-23 ENCOUNTER — Ambulatory Visit (INDEPENDENT_AMBULATORY_CARE_PROVIDER_SITE_OTHER): Payer: Medicare Other | Admitting: Internal Medicine

## 2011-02-23 DIAGNOSIS — G894 Chronic pain syndrome: Secondary | ICD-10-CM

## 2011-02-23 DIAGNOSIS — IMO0002 Reserved for concepts with insufficient information to code with codable children: Secondary | ICD-10-CM

## 2011-02-23 DIAGNOSIS — M25511 Pain in right shoulder: Secondary | ICD-10-CM

## 2011-02-23 DIAGNOSIS — G40909 Epilepsy, unspecified, not intractable, without status epilepticus: Secondary | ICD-10-CM | POA: Insufficient documentation

## 2011-02-23 DIAGNOSIS — M25519 Pain in unspecified shoulder: Secondary | ICD-10-CM | POA: Insufficient documentation

## 2011-02-23 DIAGNOSIS — F609 Personality disorder, unspecified: Secondary | ICD-10-CM

## 2011-02-23 DIAGNOSIS — L02219 Cutaneous abscess of trunk, unspecified: Secondary | ICD-10-CM

## 2011-02-23 DIAGNOSIS — Z79899 Other long term (current) drug therapy: Secondary | ICD-10-CM | POA: Insufficient documentation

## 2011-02-23 DIAGNOSIS — K219 Gastro-esophageal reflux disease without esophagitis: Secondary | ICD-10-CM | POA: Insufficient documentation

## 2011-02-23 DIAGNOSIS — Z853 Personal history of malignant neoplasm of breast: Secondary | ICD-10-CM | POA: Insufficient documentation

## 2011-02-23 DIAGNOSIS — E039 Hypothyroidism, unspecified: Secondary | ICD-10-CM | POA: Insufficient documentation

## 2011-02-23 DIAGNOSIS — L02419 Cutaneous abscess of limb, unspecified: Secondary | ICD-10-CM

## 2011-02-23 DIAGNOSIS — F341 Dysthymic disorder: Secondary | ICD-10-CM | POA: Insufficient documentation

## 2011-02-23 DIAGNOSIS — Z8505 Personal history of malignant neoplasm of liver: Secondary | ICD-10-CM | POA: Insufficient documentation

## 2011-02-23 DIAGNOSIS — E785 Hyperlipidemia, unspecified: Secondary | ICD-10-CM | POA: Insufficient documentation

## 2011-02-23 DIAGNOSIS — E119 Type 2 diabetes mellitus without complications: Secondary | ICD-10-CM | POA: Insufficient documentation

## 2011-02-23 DIAGNOSIS — E2749 Other adrenocortical insufficiency: Secondary | ICD-10-CM | POA: Insufficient documentation

## 2011-02-23 DIAGNOSIS — M329 Systemic lupus erythematosus, unspecified: Secondary | ICD-10-CM | POA: Insufficient documentation

## 2011-02-23 MED ORDER — PROMETHAZINE HCL 25 MG/ML IJ SOLN
25.0000 mg | Freq: Once | INTRAMUSCULAR | Status: AC
  Administered 2011-02-23: 25 mg via INTRAVENOUS
  Filled 2011-02-23: qty 1

## 2011-02-23 MED ORDER — DIPHENHYDRAMINE HCL 50 MG/ML IJ SOLN
25.0000 mg | Freq: Once | INTRAMUSCULAR | Status: AC
  Administered 2011-02-23: 50 mg via INTRAVENOUS
  Filled 2011-02-23: qty 1

## 2011-02-23 MED ORDER — HYDROMORPHONE HCL PF 2 MG/ML IJ SOLN
3.0000 mg | Freq: Once | INTRAMUSCULAR | Status: AC
  Administered 2011-02-23: 3 mg via INTRAVENOUS
  Filled 2011-02-23: qty 2

## 2011-02-23 NOTE — Assessment & Plan Note (Signed)
This is doing well. I think she can finish her 4 more doses of rocephin and needs no more IV abx for now. WIth regards to the long term use of a PICC or IV  I feel that this is the provence of Dr. Cyndie Chime since the he will be in need of long term access for chemotherapy.

## 2011-02-23 NOTE — Progress Notes (Signed)
Subjective:    Patient ID: [name] Carr, female    DOB: May 16, 1955, 56 y.o.   MRN: [id number]  HPI  Here for follow up - reviewed interval events and chronic medical issues:  R shoulder abscess - s/p ER visit and surgical I&D - new PICC line in RUE and ongoing IV antibiotics at direction of ID - no fever - increase in pain due to shoulder skin incision and requests dilaudid IV  DVT L IJ 12/26/10- related to PICC complication - on LMWH for same since dx and planned 6 weeks tx, but stopped 01/2011 due to nose bleed from septal perforation (eval by ENT for same, IP and OP) - LUE doppler with no acute clot on follow up imaging  R Breast cancer dx 08/2009 - hx liver mets, resolved s/p chemo (initially Cytoxan + Taxotere, then Abraxane)- Initial biopsy of her breast revealed 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 PET scan revealed liver involvement and biopsy confirmed poorly differentiated adenocarcinoma consistent with metastatic breast. Hx prior Port-A-Cath and PICC lines removed due to infection. Persisting clinical concerns re: pt self manipulation of these lines. Onc ([facility]) rx'd oral Xeloda, but not tolerated med due to nausea.   Chronic pain - managed by pain clinic Dr. Nilsa Nutting - reports remotely on home self administered narcotic injections and still wishes to resume same due to nausea and vomiting and difficulty keeping pills down - duragesic patches (2-19mcg) "helps but not enough" - also on cymbalta  SLE - hx pericarditis - on chronic prednisone for same -   follows with rheum at [facility] but not seen in recent months - no fever, no new rash   Psoriasis - chronic plaque L elbow - uses steroid cream for same  Past Medical History  Diagnosis Date  . PERSONALITY DISORDER   . Chronic pain syndrome     chronic narcotics, dependancy hx - [place] for pain mgmt  . MIGRAINE HEADACHE   . Lyme disease   . ALLERGIC RHINITIS   . ANEMIA-IRON DEFICIENCY   .  HYPERLIPIDEMIA   . GERD   . DIABETES MELLITUS, TYPE II   . DEPRESSION   . CARCINOMA, BREAST 08/2009 dx    R breast,  mets to liver - resolution s/p chemo, s/p B mastect  . SLE (systemic lupus erythematosus)     [facility] - chronic pred  . Psoriasis   . Addison's disease   . Metastases to the liver   . Degenerative joint disease   . DDD (degenerative disc disease)   . Breast cancer     bilateral mastectomy and has mets to liver  . Phlebitis after infusion     left ?calf; "S/P phenergan/demerol injection"  . Blood clot in vein 2011; 02/04/11    "right jugular vein;left jugular vein"  . Angina   . Pleurisy     "alot; put me in hospital q time; related to my SLE"  . Shortness of breath 02/04/11    "always here lately"  . Hypothyroidism     "took medicine while in hospital 08/2009 only"  . H/O hiatal hernia   . SEIZURE DISORDER     "lots of seizures; due to lupus"  . Ventilator dependent 1980's    "for 3 months S/P seizure"  . Anxiety   . Neuromuscular disorder   . Neuropathy     hands, feet, due to diabetes & back problem  . Herniated disc     "lumbar area  w/bulging"  . Syncope and collapse 02/04/11    "not the first time either"  . Surgical wound infection 02/15/2011  . Abscess or cellulitis of chest wall 02/15/2011    Review of Systems  Constitutional: Positive for fatigue. Negative for fever and unexpected weight change.  Respiratory: Negative for cough and shortness of breath.   Gastrointestinal: Negative for blood in stool.  Neurological: Negative for dizziness.       Objective:   Physical Exam  BP 118/72  Pulse 90  Temp(Src) 98.3 F (36.8 C) (Oral)  Wt 194 lb 1.9 oz (88.052 kg)  SpO2 95% Wt Readings from Last 3 Encounters:  02/23/11 194 lb 1.9 oz (88.052 kg)  02/23/11 196 lb 4 oz (89.018 kg)  02/16/11 204 lb 3.2 oz (92.625 kg)   Constitutional: She is obese; appears well-developed and well-nourished. No distress. Friend at side (who is heavily  self medicated with new pain pill rx'd earlier today) Cardiovascular: Normal rate, regular rhythm and normal heart sounds.  No murmur heard. trace pitting BLE edema. Pulmonary/Chest: Effort normal and breath sounds normal. No respiratory distress. She has no wheezes.   Skin: - R shoulder wound not examined by me > dressing c/d/i and just examined this afternoon by ID prior to OV here Psychiatric: She has a normal mood and nonchalant affect. Her behavior is normal. Judgment and thought content normal.   Lab Results  Component Value Date   WBC 11.9* 02/15/2011   HGB 9.9* 02/15/2011   HCT 30.4* 02/15/2011   PLT 285 02/15/2011   GLUCOSE 172* 02/15/2011   CHOL 272 08/09/2009   TRIG 109 08/08/2009   HDL 88 08/09/2009   LDLCALC 119 08/09/2009   ALT 9 02/15/2011   AST 11 02/15/2011   NA 136 02/15/2011   K 3.4* 02/15/2011   CL 93* 02/15/2011   CREATININE 1.04 02/15/2011   BUN 17 02/15/2011   CO2 30 02/15/2011   TSH 3.428 02/05/2011   INR 1.02 02/04/2011   HGBA1C 6.1* 12/26/2010        Assessment & Plan:  See problem list. Medications and labs reviewed today.  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 - pt instructed on proper care of new PICC in RUE -- then asks if we can administer IV dilaudid to her - or provide her rx for self administration of same at home - then requested IV Tordol now "because the shots don't work" > I declined both  Chronic pain - see issues such as above - pt reports [facility] CM visit scheduled this Friday and plans to accompany her to pain clinic visit "to help me straighten this pain problem out"  Time spent with pt/friend today 25 minutes, greater than 50% time spent counseling patient on pain, infection, cancer treatment plans and workup and medication review. Also review of interval ER, gen surg, onc and ID records in EMR

## 2011-02-23 NOTE — Patient Instructions (Signed)
It was good to see you today. We have reviewed event of the past 1 month - continue antibiotics treatment and follow up for infection and cancer as ongoing Continue to work with your pain management provider on your pain issues - Please keep scheduled followup in 3-4 months, call sooner if problems.

## 2011-02-23 NOTE — ED Notes (Signed)
Pt presents with onset of R shoulder pain that has worsened.  Pt reports IV placed in R shoulder that infiltrated, got cellulitis.  Pt reports shoulder was debrided, pt reports no difference in drainage with wet-to-dry dressings, but reports pain has worsened.  Pt reports falling off of her bed last week with pain to back of her head. -LOC pt reports continued headache since fall.  Pt reports she was seen here on 1/11.  Pt reports headache and shoulder pain has worsened since her visit here.

## 2011-02-23 NOTE — Progress Notes (Signed)
Subjective:    Patient ID: [name] Carr, female    DOB: 1955-03-02, 56 y.o.   MRN: [id number]  HPI  56 year old metastatic breast cancer with portacath associated CNS bacteremia whom I saw at [facility] long in November 2012. She had recurrence of infection After failed attempts to save portacath. She was readmitted with fevers on antibiotics. One of two blood cultures at [facility] grew a Pantoea agglomerans (formerly known as enterobacter agglomerans), cultures here at [place] from blood and from portacath after removal were negative.   I had sent her home with IV vancomycin but then received calls from home infusion co that pt had been manipulating her central line and that it was now not functioning properly. They also informed me that the patient had a history of injecting crushed dilaudid via her portacath including an episode where she had been found unresponsive at home. She has history of recurrent line infections that per home infusion co are believed due to her manipulating the lines inappropriately. I had the picc line removed by IR and called in oral antibiotics for her. She never took these as "all oral antibiotics make me vomit." She was then readmitted to [facility] after syncope after carotid ultrasound by Cardiology (she had had IJ thromobosis during that prior hospitalization). She apparently had infiltration of a shoulder IV at [facility]. She was seen by my partner Dr. Ninetta Lights during her admission not a few days ago and had repeat blood cultures done which were negative. She was noted to be picking and scratching at the site on her shoulder by RN on 5500 and this is noted in the epic charts. She was dc a few days afterwards. She came to clinic to see me two weeks ago  today to see me  And was eworked her in. She had was concerned that her shoulder was infected and was requesting IV placment for antibiotics again claiming that she could not take oral antibiotics. She offered idea of taking IM antibiotics  instead.  During that visit I ultimately confronted her about the allegations made by [facility] based on her past hx with them and at [facility]. I emphasized that if a PICC line were placed for long term option fo chemotherapy that I wanted her to have clear conversation with Dr. Cyndie Chime regarding this. Patient saw Dr. Cyndie Chime and this area was NOT improving. I advised her being seen by a surgeon for I and D. Dr Cyndie Chime and I once again discussed idea of longterm access in this pt who needs this to get chemo for her metastatic breast cancer, but on the other hand experiences recurrent infections with lines in place, under suspicion of her manipulating these sites and with understanding of risk to her to undergo more potent immunosuppression curing chemotherapy if active infections are not controlled. Surgery did perform I and D and removed necrotic tissue. There was no abscess underneath. THe patient then did have a central line placed under orders of Dr. Cyndie Chime for IV rocephin conversion from IM She has had made repeated visits to the ER on at times a daily basis including today (as I write this note) and after haivng seen both Dr. Felicity Coyer and myself.   IN clinic today she was accompanied by her apparent car driver who was herself veering on the edge of becomign unresposive. The driver "[name] Carr' was sitting in a chair with eyes half closed at best frequently slumping forward into sleep in her chair. THis was noted by  my RN staff and obvious to me when I entered the room. [name] that her friend had just received a new pain medicine from the pain clinic--that didn't agree with her too well.   Today [name] for IV pain medications from the staff and myself. She also requested extension of round of IV antibiotics for her shoulder. I examined her shoulder wound and it appeared to be healingwell with good granulation tissue. I informed her that I thought that she  should finishe her additional few doss of rocephin and then that the line should be maintained until Dr. Cyndie Chime wished to either have it removed or remain in place ultimately for chemotherapy.   She again admonished me for "spreading lies" about her --my having disussed the accusations made by [facility] about her alleged  manipulation of her central line including an alleged overdose of dilaudid crushed and injected thru the central ine that lead to a respiratory arrest." I apologizd for not confronting her about this BEFORE i had the picc line removed though i found it to be more effective to discuss my suspicions after removal of the line rather than before it I . admitted that I certainly did not have proof of her having done these things but that I have found [facility] to be generally a highly reliable company and that given their alleged longstanding hx with the pt and multple other 'red flags' such as her claining to not be able to take oral antibiotics or oral analgesics, and her history of multiple bacteria having been isolated from her central lines and her having been dc from oncology clinic at [facility] were were congruent with the clais being made about the pt to me by [facility].  I have agreed to continue to see the pt, though curiously she then told my staff that Dr Cyndie Chime had told me he would "just do what he wanted and didn't care for Dr. Clinton Gallant opinion" to my staff.  She requested that our RNs change her picc line dressing where she showed me an area of bubbled tape where she believes she got shampoo under the dressing earlier today.   I spent greater than 45 minutes with the patient including greater than 50% of time in face to face counsel of the patient and in coordination of their care.     Review of Systems  Constitutional: Negative for fever, chills, diaphoresis, activity change, appetite change, fatigue and unexpected weight change.  HENT: Negative for congestion, sore throat,  rhinorrhea, sneezing, trouble swallowing and sinus pressure.   Eyes: Negative for photophobia and visual disturbance.  Respiratory: Negative for cough, chest tightness, shortness of breath, wheezing and stridor.   Cardiovascular: Negative for chest pain, palpitations and leg swelling.  Gastrointestinal: Negative for nausea, vomiting, abdominal pain, diarrhea, constipation, blood in stool, abdominal distention and anal bleeding.  Genitourinary: Negative for dysuria, hematuria, flank pain and difficulty urinating.  Musculoskeletal: Negative for myalgias, back pain, joint swelling, arthralgias and gait problem.  Skin: Positive for color change and wound. Negative for pallor and rash.  Neurological: Negative for dizziness, tremors, weakness and light-headedness.  Hematological: Negative for adenopathy. Does not bruise/bleed easily.  Psychiatric/Behavioral: Negative for behavioral problems, confusion, sleep disturbance, dysphoric mood, decreased concentration and agitation.       Objective:   Physical Exam  Constitutional: She is oriented to person, place, and time. She appears well-developed and well-nourished. No distress.  HENT:  Head: Normocephalic and atraumatic.  Mouth/Throat: Oropharynx is  clear and moist. No oropharyngeal exudate.  Eyes: Conjunctivae and EOM are normal. Pupils are equal, round, and reactive to light. No scleral icterus.  Neck: Normal range of motion. Neck supple. No JVD present.  Cardiovascular: Normal rate, regular rhythm and normal heart sounds.  Exam reveals no gallop and no friction rub.   No murmur heard. Pulmonary/Chest: Effort normal and breath sounds normal. No respiratory distress. She has no wheezes. She has no rales. She exhibits no tenderness.  Abdominal: She exhibits no distension and no mass. There is no tenderness. There is no rebound and no guarding.  Musculoskeletal: She exhibits tenderness. She exhibits no edema.       Arms: Lymphadenopathy:    She  has no cervical adenopathy.  Neurological: She is alert and oriented to person, place, and time. She has normal reflexes. She exhibits normal muscle tone. Coordination normal.  Skin: Skin is warm and dry. She is not diaphoretic. There is erythema. No pallor.  Psychiatric: She has a normal mood and affect. Her behavior is normal. Judgment and thought content normal.          Assessment & Plan:  Abscess of shoulder This is doing well. I think she can finish her 4 more doses of rocephin and needs no more IV abx for now. WIth regards to the long term use of a PICC or IV  I feel that this is the provence of Dr. Cyndie Chime since the he will be in need of long term access for chemotherapy.   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 My suspicion is that she has secondary gain from her hospitalizaitons including not just IV narcotics but also the rN and othe rmedical attention that she receives with each hospitalization. I would propose that she be formally seen by psychology and if oncology deemed it reasonable palliative care so that we can better harmonize the treatments that she needs and desires most and that she understand the way her current psychopathology is at least in part impeding progress towards treatment. I twould also be prudent to obtain records rom [facility] give the  Mixed messages from the pt and from providers such as [facility]

## 2011-02-23 NOTE — Assessment & Plan Note (Signed)
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 My suspicion is that she has secondary gain from her hospitalizaitons including not just IV narcotics but also the rN and othe rmedical attention that she receives with each hospitalization. I would propose that she be formally seen by psychology and if oncology deemed it reasonable palliative care so that we can better harmonize the treatments that she needs and desires most and that she understand the way her current psychopathology is at least in part impeding progress towards treatment. I twould also be prudent to obtain records rom [facility] give the  Mixed messages from the pt and from providers such as [facility]

## 2011-02-23 NOTE — ED Provider Notes (Signed)
History     CSN: [id number]  Arrival date & time 02/23/11  1704   First MD Initiated Contact with Patient 02/23/11 1902      Chief Complaint  Patient presents with  . Shoulder Pain    (Consider location/radiation/quality/duration/timing/severity/associated sxs/prior treatment) Patient is a 56 y.o. female presenting with shoulder pain. The history is provided by the patient and medical records.  Shoulder Pain Pertinent negatives include no chest pain, no abdominal pain and no headaches.   the patient is a 56 year old female, with a history of bilateral mastectomy for metastatic breast cancer.  6 days ago.  She had an indwelling catheter placed.  Now.  She says that she has pain in her right shoulder.  She has not had trauma.  She denies fevers, chills.  She was seen at the infectious disease clinic today for a healing wound in her right shoulder.  Apparently she was told that she could not get analgesics at the clinic because her not dispense him anymore.  She was told to come to the emergency department if her pain became more severe.  She states that she has Dilaudid at home.  2 mg tablets, however, it is not adequate to control her pain.  Past Medical History  Diagnosis Date  . PERSONALITY DISORDER   . Chronic pain syndrome     chronic narcotics, dependancy hx - [facility] for pain mgmt  . MIGRAINE HEADACHE   . Lyme disease   . ALLERGIC RHINITIS   . ANEMIA-IRON DEFICIENCY   . HYPERLIPIDEMIA   . GERD   . DIABETES MELLITUS, TYPE II   . DEPRESSION   . CARCINOMA, BREAST 08/2009 dx    R breast,  mets to liver - resolution s/p chemo, s/p B mastect  . SLE (systemic lupus erythematosus)     [facility] - chronic pred  . Psoriasis   . Addison's disease   . Metastases to the liver   . Degenerative joint disease   . DDD (degenerative disc disease)   . Breast cancer     bilateral mastectomy and has mets to liver  . Phlebitis after infusion     left ?calf; "S/P  phenergan/demerol injection"  . Blood clot in vein 2011; 02/04/11    "right jugular vein;left jugular vein"  . Angina   . Pleurisy     "alot; put me in hospital q time; related to my SLE"  . Shortness of breath 02/04/11    "always here lately"  . Hypothyroidism     "took medicine while in hospital 08/2009 only"  . H/O hiatal hernia   . SEIZURE DISORDER     "lots of seizures; due to lupus"  . Ventilator dependent 1980's    "for 3 months S/P seizure"  . Anxiety   . Neuromuscular disorder   . Neuropathy     hands, feet, due to diabetes & back problem  . Herniated disc     "lumbar area w/bulging"  . Syncope and collapse 02/04/11    "not the first time either"  . Surgical wound infection 02/15/2011  . Abscess or cellulitis of chest wall 02/15/2011    Past Surgical History  Procedure Date  . Liver bopsy 09/2009  . Port a cath placement 09/2009; 11/19/10  . Port-a-cath removal 04/2010    "due to staph & strept"  . Port-a-cath removal 12/23/2010    Procedure: REMOVAL PORT-A-CATH;  Surgeon: Valarie Merino, MD;  Location: [facility];  Service: General;  Laterality:  Left;  . Breast surgery 09/2009    right breast biopsy  . Mastectomy 11/19/10    bilateral mastectomy   . I&d extremity ~ 2010    right hand; S/P "dog bite"  . Peripherally inserted central catheter insertion     Family History  Problem Relation Age of Onset  . Lung cancer Mother   . Lung cancer Father   . Arthritis Other   . Diabetes Other   . Hyperlipidemia Other     History  Substance Use Topics  . Smoking status: Never Smoker   . Smokeless tobacco: Never Used  . Alcohol Use: No    OB History    Grav Para Term Preterm Abortions TAB SAB Ect Mult Living                  Review of Systems  Constitutional: Negative for fever and chills.  HENT: Negative for neck pain.   Cardiovascular: Negative for chest pain.  Gastrointestinal: Negative for abdominal pain.  Musculoskeletal: Negative for back pain.        Right shoulder pain  Neurological: Negative for weakness and headaches.  Psychiatric/Behavioral: Negative for confusion.  All other systems reviewed and are negative.    Allergies  Methotrexate; Versed; Ammonia; Contrast media; Infliximab; Iohexol; Keflex; Ketorolac tromethamine; Nalbuphine; Nsaids; and Celecoxib  Home Medications   Current Outpatient Rx  Name Route Sig Dispense Refill  . ACETAMINOPHEN 500 MG PO TABS Oral Take 500 mg by mouth every 6 (six) hours as needed. For headache    . CARISOPRODOL 350 MG PO TABS Oral Take 350-700 mg by mouth 4 (four) times daily as needed. For pain and spasms.    Marland Kitchen ROCEPHIN IV Intravenous Inject 2 g into the vein. Pt has PIC-LINE    . CLONAZEPAM 1 MG PO TABS Oral Take 1 mg by mouth 2 (two) times daily as needed. For anxiety    . DOCUSATE SODIUM 100 MG PO CAPS Oral Take 200 mg by mouth 2 (two) times daily.     . DULOXETINE HCL 60 MG PO CPEP Oral Take 60 mg by mouth 2 (two) times daily.    Marland Kitchen EPINEPHRINE 0.3 MG/0.3ML IJ DEVI Intramuscular Inject 0.3 mg into the muscle once as needed. For bee stings.     . FENTANYL 100 MCG/HR TD PT72 Transdermal Place 1 patch onto the skin every 3 (three) days.    . FENTANYL 50 MCG/HR TD PT72 Transdermal Place 1 patch onto the skin every 3 (three) days.    . FUROSEMIDE 20 MG PO TABS Oral Take 20 mg by mouth daily.    Marland Kitchen GABAPENTIN 300 MG PO CAPS Oral Take 900 mg by mouth 3 (three) times daily.    Marland Kitchen HYDROCHLOROTHIAZIDE 25 MG PO TABS Oral Take 25 mg by mouth daily.      Marland Kitchen HYDROMORPHONE HCL 4 MG PO TABS Oral Take 4 mg by mouth every 4 (four) hours as needed. For pain.    Marland Kitchen OMEPRAZOLE 40 MG PO CPDR Oral Take 40 mg by mouth every morning.    Marland Kitchen POTASSIUM CHLORIDE CRYS ER 20 MEQ PO TBCR Oral Take 20 mEq by mouth 2 (two) times daily. Or as directed     . PREDNISONE 10 MG PO TABS Oral Take 10 mg by mouth daily.      Marland Kitchen PROBIOTIC FORMULA PO CAPS Oral Take 1 capsule by mouth daily.     Marland Kitchen PROMETHAZINE HCL 25 MG PO TABS Oral Take  25 mg by  mouth every 4 (four) hours as needed. For nausea.    Marland Kitchen SPIRONOLACTONE-HCTZ 25-25 MG PO TABS Oral Take 1 tablet by mouth 2 (two) times daily.      . TRAZODONE HCL 150 MG PO TABS Oral Take 150 mg by mouth at bedtime.       BP 113/67  Pulse 92  Temp(Src) 98.6 F (37 C) (Oral)  Resp 18  Ht 5' (1.524 m)  Wt 197 lb (89.359 kg)  BMI 38.47 kg/m2  SpO2 94%  Physical Exam  Constitutional: She is oriented to person, place, and time. She appears well-developed and well-nourished.  HENT:  Head: Normocephalic and atraumatic.  Eyes: Conjunctivae are normal. Pupils are equal, round, and reactive to light.  Neck: Normal range of motion. Neck supple.  Pulmonary/Chest: Effort normal.  Musculoskeletal: Normal range of motion. She exhibits no edema and no tenderness.  Neurological: She is alert and oriented to person, place, and time.  Skin: Skin is warm and dry.  Psychiatric: She has a normal mood and affect.    ED Course  Procedures (including critical care time) 56 year old female, with metastatic breast cancer complains of nontraumatic right shoulder pain with no systemic symptoms.  She had an indwelling catheter already.  We will give her a cocktail of Dilaudid, Phenergan, and Benadryl, and then release her to followup with her oncologist.  Labs Reviewed - No data to display No results found.   No diagnosis found.    MDM  Shoulder pain No trauma.  No systemic illness.        Nicholes Stairs, MD 02/23/11 [phone]

## 2011-02-24 ENCOUNTER — Ambulatory Visit: Payer: Medicare Other | Admitting: Infectious Disease

## 2011-02-24 ENCOUNTER — Encounter (INDEPENDENT_AMBULATORY_CARE_PROVIDER_SITE_OTHER): Payer: Medicare Other | Admitting: General Surgery

## 2011-02-24 ENCOUNTER — Telehealth: Payer: Self-pay | Admitting: *Deleted

## 2011-02-24 NOTE — Telephone Encounter (Signed)
Left msg on vm stating received referral on [name] Carr, actually schedule to go out and see her on Monday 02/28/11, but want to let md know that pt has been going to ED on several occassions stating shoulder & back pain. Has contacted pain clinic and also let them.Marland KitchenMarland Kitchen1/17/13@10 :02am/LMB

## 2011-02-24 NOTE — Telephone Encounter (Signed)
i am aware    thanks

## 2011-02-28 ENCOUNTER — Telehealth: Payer: Self-pay

## 2011-02-28 ENCOUNTER — Emergency Department (HOSPITAL_COMMUNITY)
Admission: EM | Admit: 2011-02-28 | Discharge: 2011-03-01 | Disposition: A | Discharge status: Home / Self Care | Payer: Medicare Other | Attending: Emergency Medicine | Admitting: Emergency Medicine

## 2011-02-28 ENCOUNTER — Encounter (HOSPITAL_COMMUNITY): Payer: Self-pay | Admitting: Emergency Medicine

## 2011-02-28 DIAGNOSIS — F3289 Other specified depressive episodes: Secondary | ICD-10-CM | POA: Insufficient documentation

## 2011-02-28 DIAGNOSIS — G894 Chronic pain syndrome: Secondary | ICD-10-CM | POA: Insufficient documentation

## 2011-02-28 DIAGNOSIS — Y849 Medical procedure, unspecified as the cause of abnormal reaction of the patient, or of later complication, without mention of misadventure at the time of the procedure: Secondary | ICD-10-CM | POA: Insufficient documentation

## 2011-02-28 DIAGNOSIS — M79609 Pain in unspecified limb: Secondary | ICD-10-CM | POA: Insufficient documentation

## 2011-02-28 DIAGNOSIS — Z853 Personal history of malignant neoplasm of breast: Secondary | ICD-10-CM | POA: Insufficient documentation

## 2011-02-28 DIAGNOSIS — L408 Other psoriasis: Secondary | ICD-10-CM | POA: Insufficient documentation

## 2011-02-28 DIAGNOSIS — K219 Gastro-esophageal reflux disease without esophagitis: Secondary | ICD-10-CM | POA: Insufficient documentation

## 2011-02-28 DIAGNOSIS — M1990 Unspecified osteoarthritis, unspecified site: Secondary | ICD-10-CM | POA: Insufficient documentation

## 2011-02-28 DIAGNOSIS — F329 Major depressive disorder, single episode, unspecified: Secondary | ICD-10-CM | POA: Insufficient documentation

## 2011-02-28 DIAGNOSIS — M329 Systemic lupus erythematosus, unspecified: Secondary | ICD-10-CM | POA: Insufficient documentation

## 2011-02-28 DIAGNOSIS — M79603 Pain in arm, unspecified: Secondary | ICD-10-CM

## 2011-02-28 DIAGNOSIS — E119 Type 2 diabetes mellitus without complications: Secondary | ICD-10-CM | POA: Insufficient documentation

## 2011-02-28 DIAGNOSIS — C787 Secondary malignant neoplasm of liver and intrahepatic bile duct: Secondary | ICD-10-CM | POA: Insufficient documentation

## 2011-02-28 DIAGNOSIS — T82898A Other specified complication of vascular prosthetic devices, implants and grafts, initial encounter: Secondary | ICD-10-CM | POA: Insufficient documentation

## 2011-02-28 DIAGNOSIS — Z452 Encounter for adjustment and management of vascular access device: Secondary | ICD-10-CM

## 2011-02-28 DIAGNOSIS — G43909 Migraine, unspecified, not intractable, without status migrainosus: Secondary | ICD-10-CM | POA: Insufficient documentation

## 2011-02-28 LAB — CBC
MCH: 23.4 pg — ABNORMAL LOW (ref 26.0–34.0)
MCV: 76.1 fL — ABNORMAL LOW (ref 78.0–100.0)
Platelets: 220 10*3/uL (ref 150–400)
RBC: 3.55 MIL/uL — ABNORMAL LOW (ref 3.87–5.11)
RDW: 15.7 % — ABNORMAL HIGH (ref 11.5–15.5)
WBC: 10 10*3/uL (ref 4.0–10.5)

## 2011-02-28 LAB — DIFFERENTIAL
Basophils Absolute: 0 10*3/uL (ref 0.0–0.1)
Basophils Relative: 0 % (ref 0–1)
Eosinophils Absolute: 0.1 10*3/uL (ref 0.0–0.7)
Eosinophils Relative: 1 % (ref 0–5)
Lymphs Abs: 2.1 10*3/uL (ref 0.7–4.0)
Neutrophils Relative %: 71 % (ref 43–77)

## 2011-02-28 LAB — BASIC METABOLIC PANEL
Calcium: 9.3 mg/dL (ref 8.4–10.5)
GFR calc Af Amer: 75 mL/min — ABNORMAL LOW (ref >90)
GFR calc non Af Amer: 65 mL/min — ABNORMAL LOW (ref >90)
Potassium: 3.3 mEq/L — ABNORMAL LOW (ref 3.5–5.1)
Sodium: 136 mEq/L (ref 135–145)

## 2011-02-28 MED ORDER — PROMETHAZINE HCL 25 MG/ML IJ SOLN
25.0000 mg | INTRAMUSCULAR | Status: AC
  Administered 2011-02-28: 25 mg via INTRAMUSCULAR
  Filled 2011-02-28: qty 1

## 2011-02-28 MED ORDER — HYDROMORPHONE HCL PF 2 MG/ML IJ SOLN
3.0000 mg | Freq: Once | INTRAMUSCULAR | Status: AC
  Administered 2011-02-28: 3 mg via INTRAMUSCULAR
  Filled 2011-02-28: qty 1

## 2011-02-28 NOTE — ED Notes (Signed)
Pt c/o pain at pic line site in right upper arm

## 2011-02-28 NOTE — Telephone Encounter (Signed)
Received call from Deanna, RN with [facility].  Deanna visited pt today for PICC dressing change.  Small ulceration noted under insertion site with redness & tenderness while cleaning.  Pt has temperature of 99.9.  Has completed Rocephin 2g x 14 days.  Last dose 02/26/11.   Will review with Misty Stanley, NP in Dr Patsy Lager absence. dph

## 2011-02-28 NOTE — Telephone Encounter (Signed)
Instructed Deanna, RN to continue to monitor site and have pt call ID for temp of 101 or greater.  Deanna verbalizes agreement.    Per Deanna, pt continues to visit either [facility] ED or [facility] ED DAILY.  Pt was upset at today's visit with Deanna b/c [facility] "would only give her Ultram."  Deanna reports that pt refuses to let her see her medications and states that "a retired doctor" comes over and puts liquid Dilaudid in her PICC for her.  Per Deanna, pt also claims to be using Phenergan in her PICC, although this office has listed oral phenergan as a med.    Note left for Dr Patsy Lager information upon his return. dph

## 2011-02-28 NOTE — ED Provider Notes (Signed)
History     CSN: [id number]  Arrival date & time 02/28/11  [name] Carr   First MD Initiated Contact with Patient 02/28/11 2131      Chief Complaint  Patient presents with  . Pain     HPI  History provided by the patient. Patient is a 56 year old female with history of breast cancer and currently with right upper arm PICC line presents with complaints of pain around the PICC line site similar to past PICC line infections. She denies any bleeding or drainage around the PICC line. She does report having some redness and swelling. Patient has home nursing come and change dressing around PICC line regularly. Patient states that she currently has a PICC line for possible future chemotherapy treatments. She also reports having recent treatments with Rocephin through the PICC line to treat a cellulitis infection of her right shoulder. Patient had the skin infection debrided by the surgeon several weeks ago. She reports some continued pains around the shoulder. She denies any new redness or drainage from the shoulder wound. Patient does also report subjective fevers at home, with chills and sweats. Patient has taken her normal at-home pain medications without improvement of her symptoms. She also reports taking 3 Tylenol earlier today. Patient reports trying to get in touch with her doctor but was unable to reach them. She was advised by the on-call nurse to come to the emergency room for further evaluation.    Past Medical History  Diagnosis Date  . PERSONALITY DISORDER   . Chronic pain syndrome     chronic narcotics, dependancy hx - [facility] for pain mgmt  . MIGRAINE HEADACHE   . Lyme disease   . ALLERGIC RHINITIS   . ANEMIA-IRON DEFICIENCY   . HYPERLIPIDEMIA   . GERD   . DIABETES MELLITUS, TYPE II   . DEPRESSION   . CARCINOMA, BREAST 08/2009 dx    R breast,  mets to liver - resolution s/p chemo, s/p B mastect  . SLE (systemic lupus erythematosus)     [facility] [place] [facility] [place] - chronic pred  .  Psoriasis   . Addison's disease   . Metastases to the liver   . Degenerative joint disease   . DDD (degenerative disc disease)   . Breast cancer     bilateral mastectomy and has mets to liver  . Phlebitis after infusion     left ?calf; "S/P phenergan/demerol injection"  . Blood clot in vein 2011; 02/04/11    "right jugular vein;left jugular vein"  . Angina   . Pleurisy     "alot; put me in hospital q time; related to my SLE"  . Shortness of breath 02/04/11    "always here lately"  . Hypothyroidism     "took medicine while in hospital 08/2009 only"  . H/O hiatal hernia   . SEIZURE DISORDER     "lots of seizures; due to lupus"  . Ventilator dependent 1980's    "for 3 months S/P seizure"  . Anxiety   . Neuromuscular disorder   . Neuropathy     hands, feet, due to diabetes & back problem  . Herniated disc     "lumbar area w/bulging"  . Syncope and collapse 02/04/11    "not the first time either"  . Surgical wound infection 02/15/2011  . Abscess or cellulitis of chest wall 02/15/2011    Past Surgical History  Procedure Date  . Liver bopsy 09/2009  . Port a cath placement 09/2009; 11/19/10  . Port-a-cath  removal 04/2010    "due to staph & strept"  . Port-a-cath removal 12/23/2010    Procedure: REMOVAL PORT-A-CATH;  Surgeon: Valarie Merino, MD;  Location: [facility];  Service: General;  Laterality: Left;  . Breast surgery 09/2009    right breast biopsy  . Mastectomy 11/19/10    bilateral mastectomy   . I&d extremity ~ 2010    right hand; S/P "dog bite"  . Peripherally inserted central catheter insertion     Family History  Problem Relation Age of Onset  . Lung cancer Mother   . Lung cancer Father   . Arthritis Other   . Diabetes Other   . Hyperlipidemia Other     History  Substance Use Topics  . Smoking status: Never Smoker   . Smokeless tobacco: Never Used  . Alcohol Use: No    OB History    Grav Para Term Preterm Abortions TAB SAB Ect Mult Living                    Review of Systems  Constitutional: Positive for fever, chills, diaphoresis and appetite change.  Respiratory: Negative for cough and shortness of breath.   Cardiovascular: Negative for chest pain.  Gastrointestinal: Positive for nausea. Negative for vomiting.  All other systems reviewed and are negative.    Allergies  Methotrexate; Versed; Ammonia; Contrast media; Infliximab; Iohexol; Keflex; Ketorolac tromethamine; Nalbuphine; Nsaids; Ultram; and Celecoxib  Home Medications   Current Outpatient Rx  Name Route Sig Dispense Refill  . ACETAMINOPHEN 500 MG PO TABS Oral Take 500 mg by mouth every 6 (six) hours as needed. For headache    . CARISOPRODOL 350 MG PO TABS Oral Take 350-700 mg by mouth 4 (four) times daily as needed. For pain and spasms.    Marland Kitchen ROCEPHIN IV Intravenous Inject 2 g into the vein. Pt has PIC-LINE    . CLONAZEPAM 1 MG PO TABS Oral Take 1 mg by mouth 2 (two) times daily as needed. For anxiety    . DOCUSATE SODIUM 100 MG PO CAPS Oral Take 200 mg by mouth 2 (two) times daily.     . DULOXETINE HCL 60 MG PO CPEP Oral Take 60 mg by mouth 2 (two) times daily.    Marland Kitchen EPINEPHRINE 0.3 MG/0.3ML IJ DEVI Intramuscular Inject 0.3 mg into the muscle once as needed. For bee stings.     . FENTANYL 100 MCG/HR TD PT72 Transdermal Place 1 patch onto the skin every 3 (three) days.    . FENTANYL 50 MCG/HR TD PT72 Transdermal Place 1 patch onto the skin every 3 (three) days.    . FUROSEMIDE 20 MG PO TABS Oral Take 20 mg by mouth daily.    Marland Kitchen GABAPENTIN 300 MG PO CAPS Oral Take 900 mg by mouth 3 (three) times daily.    Marland Kitchen HYDROCHLOROTHIAZIDE 25 MG PO TABS Oral Take 25 mg by mouth daily.      Marland Kitchen HYDROMORPHONE HCL 4 MG PO TABS Oral Take 4 mg by mouth every 4 (four) hours as needed. For pain.    Marland Kitchen OMEPRAZOLE 40 MG PO CPDR Oral Take 40 mg by mouth every morning.    Marland Kitchen POTASSIUM CHLORIDE CRYS ER 20 MEQ PO TBCR Oral Take 20 mEq by mouth 2 (two) times daily. Or as directed     . PREDNISONE 10 MG PO  TABS Oral Take 10 mg by mouth daily.      Marland Kitchen PROBIOTIC FORMULA PO CAPS Oral Take 1 capsule  by mouth daily.     Marland Kitchen PROMETHAZINE HCL 25 MG PO TABS Oral Take 25 mg by mouth every 4 (four) hours as needed. For nausea.    Marland Kitchen SPIRONOLACTONE-HCTZ 25-25 MG PO TABS Oral Take 1 tablet by mouth 2 (two) times daily.      . TRAZODONE HCL 150 MG PO TABS Oral Take 150 mg by mouth at bedtime.       BP 111/64  Pulse 81  Temp(Src) 98.2 F (36.8 C) (Oral)  Resp 18  SpO2 98%  Physical Exam  Nursing note and vitals reviewed. Constitutional: She is oriented to person, place, and time. She appears well-developed and well-nourished. No distress.  HENT:  Head: Normocephalic and atraumatic.  Cardiovascular: Normal rate and regular rhythm.   Pulmonary/Chest: Effort normal and breath sounds normal. No respiratory distress.  Abdominal: Soft.  Neurological: She is alert and oriented to person, place, and time.  Skin: Skin is warm and dry.       Well healing 2-3 cm wound to the top of right shoulder with mild surrounding erythema or skin. No induration or drainage from the wound. Patient has mild to moderate tenderness to palpation along medial right upper arm proximal to PICC site. There is mild erythema around the PICC site. No bleeding or drainage. No induration of skin. No erythematous streaks of skin.  Psychiatric: She has a normal mood and affect. Her behavior is normal.    ED Course  Procedures    Labs Reviewed  CBC  DIFFERENTIAL  BASIC METABOLIC PANEL  CULTURE, BLOOD (ROUTINE X 2)  CULTURE, BLOOD (ROUTINE X 2)   Results for orders placed during the hospital encounter of 02/28/11  CBC      Component Value Range   WBC 10.0  4.0 - 10.5 (K/uL)   RBC 3.55 (*) 3.87 - 5.11 (MIL/uL)   Hemoglobin 8.3 (*) 12.0 - 15.0 (g/dL)   HCT 16.1 (*) 09.6 - 46.0 (%)   MCV 76.1 (*) 78.0 - 100.0 (fL)   MCH 23.4 (*) 26.0 - 34.0 (pg)   MCHC 30.7  30.0 - 36.0 (g/dL)   RDW 04.5 (*) 40.9 - 15.5 (%)   Platelets 220  150 -  400 (K/uL)  DIFFERENTIAL      Component Value Range   Neutrophils Relative 71  43 - 77 (%)   Neutro Abs 7.1  1.7 - 7.7 (K/uL)   Lymphocytes Relative 21  12 - 46 (%)   Lymphs Abs 2.1  0.7 - 4.0 (K/uL)   Monocytes Relative 7  3 - 12 (%)   Monocytes Absolute 0.7  0.1 - 1.0 (K/uL)   Eosinophils Relative 1  0 - 5 (%)   Eosinophils Absolute 0.1  0.0 - 0.7 (K/uL)   Basophils Relative 0  0 - 1 (%)   Basophils Absolute 0.0  0.0 - 0.1 (K/uL)  BASIC METABOLIC PANEL      Component Value Range   Sodium 136  135 - 145 (mEq/L)   Potassium 3.3 (*) 3.5 - 5.1 (mEq/L)   Chloride 95 (*) 96 - 112 (mEq/L)   CO2 29  19 - 32 (mEq/L)   Glucose, Bld 85  70 - 99 (mg/dL)   BUN 14  6 - 23 (mg/dL)   Creatinine, Ser 8.11  0.50 - 1.10 (mg/dL)   Calcium 9.3  8.4 - 91.4 (mg/dL)   GFR calc non Af Amer 65 (*) >90 (mL/min)   GFR calc Af Amer 75 (*) >90 (mL/min)  No results found.   1. PICC (peripherally inserted central catheter) removal   2. Upper extremity pain       MDM  9:30 PM patient seen and evaluated. Patient in no acute distress.  Patient was discussed with attending physician. There are no significant signs for an infection around the PICC line at this time. Patient is having pain associated for the PICC line. Patient also reports having competitions of PICC lines previously. At this time patient has finished her antibiotic regimen through the PICC line it is felt best treatment will be to remove PICC line. Plan to send blood and PICC tip cultures. Patient is afebrile with normal WBC count. At this time we will have patient return home with strict return precautions. Patient has been instructed to call PCP and agrees to this plan.      Angus Seller, PA 03/01/11 0028

## 2011-03-01 ENCOUNTER — Telehealth: Payer: Self-pay

## 2011-03-01 LAB — CATH TIP CULTURE: Culture: NO GROWTH

## 2011-03-01 MED ORDER — HYDROMORPHONE HCL PF 2 MG/ML IJ SOLN
3.0000 mg | Freq: Once | INTRAMUSCULAR | Status: AC
  Administered 2011-03-01: 3 mg via INTRAMUSCULAR
  Filled 2011-03-01: qty 2

## 2011-03-01 NOTE — ED Provider Notes (Signed)
Medical screening examination/treatment/procedure(s) were performed by non-physician practitioner and as supervising physician I was immediately available for consultation/collaboration.  Flint Melter, MD 03/01/11 [phone]

## 2011-03-01 NOTE — Telephone Encounter (Signed)
Received call from Deanna, RN with [facility] verifying that pt's PICC was removed in ED last night.    Dr Cyndie Chime aware. dph

## 2011-03-04 ENCOUNTER — Encounter (INDEPENDENT_AMBULATORY_CARE_PROVIDER_SITE_OTHER): Payer: Medicare Other | Admitting: General Surgery

## 2011-03-04 ENCOUNTER — Telehealth (INDEPENDENT_AMBULATORY_CARE_PROVIDER_SITE_OTHER): Payer: Self-pay | Admitting: General Surgery

## 2011-03-07 LAB — CULTURE, BLOOD (ROUTINE X 2)
Culture  Setup Time: 201301220403
Culture: NO GROWTH

## 2011-03-09 NOTE — Telephone Encounter (Signed)
Patient has been scheduled for 03/18/11 w/Dr. Johna Sheriff

## 2011-03-10 ENCOUNTER — Other Ambulatory Visit: Payer: Self-pay | Admitting: Internal Medicine

## 2011-03-15 ENCOUNTER — Other Ambulatory Visit: Payer: Self-pay | Admitting: Internal Medicine

## 2011-03-18 ENCOUNTER — Telehealth (INDEPENDENT_AMBULATORY_CARE_PROVIDER_SITE_OTHER): Payer: Self-pay | Admitting: General Surgery

## 2011-03-18 ENCOUNTER — Encounter (HOSPITAL_COMMUNITY): Payer: Self-pay | Admitting: *Deleted

## 2011-03-18 ENCOUNTER — Emergency Department (HOSPITAL_COMMUNITY): Payer: Medicare Other

## 2011-03-18 ENCOUNTER — Other Ambulatory Visit: Payer: Self-pay

## 2011-03-18 ENCOUNTER — Emergency Department (HOSPITAL_COMMUNITY)
Admission: EM | Admit: 2011-03-18 | Discharge: 2011-03-19 | Disposition: A | Discharge status: Home / Self Care | Payer: Medicare Other | Attending: Emergency Medicine | Admitting: Emergency Medicine

## 2011-03-18 ENCOUNTER — Encounter (INDEPENDENT_AMBULATORY_CARE_PROVIDER_SITE_OTHER): Payer: Medicare Other | Admitting: General Surgery

## 2011-03-18 DIAGNOSIS — E039 Hypothyroidism, unspecified: Secondary | ICD-10-CM | POA: Insufficient documentation

## 2011-03-18 DIAGNOSIS — R51 Headache: Secondary | ICD-10-CM | POA: Insufficient documentation

## 2011-03-18 DIAGNOSIS — W19XXXA Unspecified fall, initial encounter: Secondary | ICD-10-CM

## 2011-03-18 DIAGNOSIS — E1142 Type 2 diabetes mellitus with diabetic polyneuropathy: Secondary | ICD-10-CM | POA: Insufficient documentation

## 2011-03-18 DIAGNOSIS — Z8505 Personal history of malignant neoplasm of liver: Secondary | ICD-10-CM | POA: Insufficient documentation

## 2011-03-18 DIAGNOSIS — M1990 Unspecified osteoarthritis, unspecified site: Secondary | ICD-10-CM | POA: Insufficient documentation

## 2011-03-18 DIAGNOSIS — E785 Hyperlipidemia, unspecified: Secondary | ICD-10-CM | POA: Insufficient documentation

## 2011-03-18 DIAGNOSIS — E1149 Type 2 diabetes mellitus with other diabetic neurological complication: Secondary | ICD-10-CM | POA: Insufficient documentation

## 2011-03-18 DIAGNOSIS — K219 Gastro-esophageal reflux disease without esophagitis: Secondary | ICD-10-CM | POA: Insufficient documentation

## 2011-03-18 DIAGNOSIS — M5380 Other specified dorsopathies, site unspecified: Secondary | ICD-10-CM | POA: Insufficient documentation

## 2011-03-18 DIAGNOSIS — G894 Chronic pain syndrome: Secondary | ICD-10-CM | POA: Insufficient documentation

## 2011-03-18 DIAGNOSIS — M542 Cervicalgia: Secondary | ICD-10-CM | POA: Insufficient documentation

## 2011-03-18 DIAGNOSIS — M545 Low back pain, unspecified: Secondary | ICD-10-CM | POA: Insufficient documentation

## 2011-03-18 DIAGNOSIS — F3289 Other specified depressive episodes: Secondary | ICD-10-CM | POA: Insufficient documentation

## 2011-03-18 DIAGNOSIS — F329 Major depressive disorder, single episode, unspecified: Secondary | ICD-10-CM | POA: Insufficient documentation

## 2011-03-18 DIAGNOSIS — Z853 Personal history of malignant neoplasm of breast: Secondary | ICD-10-CM | POA: Insufficient documentation

## 2011-03-18 DIAGNOSIS — K449 Diaphragmatic hernia without obstruction or gangrene: Secondary | ICD-10-CM | POA: Insufficient documentation

## 2011-03-18 DIAGNOSIS — G40802 Other epilepsy, not intractable, without status epilepticus: Secondary | ICD-10-CM | POA: Insufficient documentation

## 2011-03-18 DIAGNOSIS — M25519 Pain in unspecified shoulder: Secondary | ICD-10-CM | POA: Insufficient documentation

## 2011-03-18 LAB — URINALYSIS, ROUTINE W REFLEX MICROSCOPIC
Bilirubin Urine: NEGATIVE
Hgb urine dipstick: NEGATIVE
Ketones, ur: NEGATIVE mg/dL
Specific Gravity, Urine: 1.008 (ref 1.005–1.030)
Urobilinogen, UA: 0.2 mg/dL (ref 0.0–1.0)

## 2011-03-18 LAB — URINE MICROSCOPIC-ADD ON

## 2011-03-18 NOTE — ED Notes (Signed)
Pt comes from home where she was in her kitchen.  Pt fell onto her vinyl kitchen floor from slipping.  Pt has a hx of chronic back pain.  Denies LOC.  Pt refused immobilization.

## 2011-03-18 NOTE — ED Notes (Signed)
Out to radiology at this time.

## 2011-03-18 NOTE — ED Notes (Signed)
ZOX:[id number]<AV> Expected date:<BR> Expected time:<BR> Means of arrival:<BR> Comments:<BR> EMA CA

## 2011-03-19 ENCOUNTER — Emergency Department (HOSPITAL_COMMUNITY): Payer: Medicare Other

## 2011-03-19 MED ORDER — HYDROMORPHONE HCL PF 1 MG/ML IJ SOLN
2.0000 mg | Freq: Once | INTRAMUSCULAR | Status: AC
  Administered 2011-03-19: 2 mg via INTRAMUSCULAR
  Filled 2011-03-19: qty 2

## 2011-03-19 MED ORDER — PROMETHAZINE HCL 25 MG/ML IJ SOLN
25.0000 mg | Freq: Once | INTRAMUSCULAR | Status: AC
  Administered 2011-03-19: 25 mg via INTRAMUSCULAR
  Filled 2011-03-19: qty 1

## 2011-03-19 MED ORDER — HYDROMORPHONE HCL PF 1 MG/ML IJ SOLN
1.0000 mg | Freq: Once | INTRAMUSCULAR | Status: AC
  Administered 2011-03-19: 1 mg via INTRAMUSCULAR
  Filled 2011-03-19: qty 1

## 2011-03-19 NOTE — ED Provider Notes (Signed)
History     CSN: [id number]  Arrival date & time 03/18/11  2150   First MD Initiated Contact with Patient 03/19/11 0005      Chief Complaint  Patient presents with  . Back Pain    s/p fall and is now having spasms    (Consider location/radiation/quality/duration/timing/severity/associated sxs/prior treatment) Patient is a 56 y.o. female presenting with back pain. The history is provided by the patient.  Back Pain    patient fell today at home when she slipped on her kitchen floor. Brief loss of consciousness. No symptoms prior to her fall. Complains of pain to her head, right side of her neck and shoulder and lower back pain. Patient has history of breast cancer a use her normal pain medication without relief. Pain is described as sharp and worse with movement made better with nothing. Denies any upper or lower extremity paresthesias or weakness. Bowel bladder function are intact  Past Medical History  Diagnosis Date  . PERSONALITY DISORDER   . Chronic pain syndrome     chronic narcotics, dependancy hx - [facility] for pain mgmt  . MIGRAINE HEADACHE   . Lyme disease   . ALLERGIC RHINITIS   . ANEMIA-IRON DEFICIENCY   . HYPERLIPIDEMIA   . GERD   . DIABETES MELLITUS, TYPE II   . DEPRESSION   . CARCINOMA, BREAST 08/2009 dx    R breast,  mets to liver - resolution s/p chemo, s/p B mastect  . SLE (systemic lupus erythematosus)     [facility] - chronic pred  . Psoriasis   . Addison's disease   . Metastases to the liver   . Degenerative joint disease   . DDD (degenerative disc disease)   . Breast cancer     bilateral mastectomy and has mets to liver  . Phlebitis after infusion     left ?calf; "S/P phenergan/demerol injection"  . Blood clot in vein 2011; 02/04/11    "right jugular vein;left jugular vein"  . Angina   . Pleurisy     "alot; put me in hospital q time; related to my SLE"  . Shortness of breath 02/04/11    "always here lately"  . Hypothyroidism     "took  medicine while in hospital 08/2009 only"  . H/O hiatal hernia   . SEIZURE DISORDER     "lots of seizures; due to lupus"  . Ventilator dependent 1980's    "for 3 months S/P seizure"  . Anxiety   . Neuromuscular disorder   . Neuropathy     hands, feet, due to diabetes & back problem  . Herniated disc     "lumbar area w/bulging"  . Syncope and collapse 02/04/11    "not the first time either"  . Surgical wound infection 02/15/2011  . Abscess or cellulitis of chest wall 02/15/2011    Past Surgical History  Procedure Date  . Liver bopsy 09/2009  . Port a cath placement 09/2009; 11/19/10  . Port-a-cath removal 04/2010    "due to staph & strept"  . Port-a-cath removal 12/23/2010    Procedure: REMOVAL PORT-A-CATH;  Surgeon: Valarie Merino, MD;  Location: [facility];  Service: General;  Laterality: Left;  . Breast surgery 09/2009    right breast biopsy  . Mastectomy 11/19/10    bilateral mastectomy   . I&d extremity ~ 2010    right hand; S/P "dog bite"  . Peripherally inserted central catheter insertion     Family History  Problem  Relation Age of Onset  . Lung cancer Mother   . Lung cancer Father   . Arthritis Other   . Diabetes Other   . Hyperlipidemia Other     History  Substance Use Topics  . Smoking status: Never Smoker   . Smokeless tobacco: Never Used  . Alcohol Use: No    OB History    Grav Para Term Preterm Abortions TAB SAB Ect Mult Living                  Review of Systems  Musculoskeletal: Positive for back pain.  All other systems reviewed and are negative.    Allergies  Methotrexate; Versed; Ammonia; Contrast media; Infliximab; Iohexol; Keflex; Ketorolac tromethamine; Nalbuphine; Nsaids; Ultram; and Celecoxib  Home Medications   Current Outpatient Rx  Name Route Sig Dispense Refill  . ACETAMINOPHEN 500 MG PO TABS Oral Take 500 mg by mouth every 6 (six) hours as needed. For headache    . CARISOPRODOL 350 MG PO TABS  TAKE 1 TABLET BY MOUTH FOUR TIMES DAILY  AS NEEDED 120 tablet 0  . CLONAZEPAM 1 MG PO TABS Oral Take 1 mg by mouth 2 (two) times daily as needed. For anxiety    . DOCUSATE SODIUM 100 MG PO CAPS Oral Take 200 mg by mouth 2 (two) times daily.     . DULOXETINE HCL 60 MG PO CPEP Oral Take 60 mg by mouth 2 (two) times daily.    . FENTANYL 100 MCG/HR TD PT72 Transdermal Place 1 patch onto the skin every 3 (three) days.    . FENTANYL 50 MCG/HR TD PT72 Transdermal Place 1 patch onto the skin every 3 (three) days.    . FUROSEMIDE 20 MG PO TABS  TAKE 1 TABLET BY MOUTH EVERY DAY 30 tablet 5  . GABAPENTIN 300 MG PO CAPS Oral Take 900 mg by mouth 3 (three) times daily.    Marland Kitchen HYDROCHLOROTHIAZIDE 25 MG PO TABS Oral Take 25 mg by mouth daily.      Marland Kitchen HYDROMORPHONE HCL 4 MG PO TABS Oral Take 4 mg by mouth every 4 (four) hours as needed. For pain.    Marland Kitchen OMEPRAZOLE 40 MG PO CPDR Oral Take 40 mg by mouth every morning.    Marland Kitchen POTASSIUM CHLORIDE CRYS ER 20 MEQ PO TBCR Oral Take 20 mEq by mouth 2 (two) times daily. Or as directed     . PREDNISONE 10 MG PO TABS  TAKE 1 TABLET BY MOUTH EVERY DAY 30 tablet 2  . PROBIOTIC FORMULA PO CAPS Oral Take 1 capsule by mouth daily.     Marland Kitchen PROMETHAZINE HCL 25 MG PO TABS Oral Take 25 mg by mouth every 4 (four) hours as needed. For nausea.    Marland Kitchen SPIRONOLACTONE-HCTZ 25-25 MG PO TABS Oral Take 1 tablet by mouth 2 (two) times daily.      . TRAZODONE HCL 150 MG PO TABS Oral Take 150 mg by mouth at bedtime.     Marland Kitchen EPINEPHRINE 0.3 MG/0.3ML IJ DEVI Intramuscular Inject 0.3 mg into the muscle once as needed. For bee stings.       BP 141/70  Pulse 82  Temp(Src) 98.6 F (37 C) (Oral)  Resp 18  SpO2 95%  Physical Exam  Nursing note and vitals reviewed. Constitutional: She is oriented to person, place, and time. She appears well-developed and well-nourished.  Non-toxic appearance. No distress.  HENT:  Head: Normocephalic and atraumatic.  Eyes: Conjunctivae, EOM and lids are normal. Pupils  are equal, round, and reactive to light.    Neck: Normal range of motion. Neck supple. Muscular tenderness present. No spinous process tenderness present. No tracheal deviation and normal range of motion present. No mass present.    Cardiovascular: Normal rate, regular rhythm and normal heart sounds.  Exam reveals no gallop.   No murmur heard. Pulmonary/Chest: Effort normal and breath sounds normal. No stridor. No respiratory distress. She has no decreased breath sounds. She has no wheezes. She has no rhonchi. She has no rales.  Abdominal: Soft. Normal appearance and bowel sounds are normal. She exhibits no distension. There is no tenderness. There is no rebound and no CVA tenderness.  Musculoskeletal: She exhibits no edema and no tenderness.       Right shoulder: She exhibits decreased range of motion and tenderness. She exhibits no swelling.       Lumbar back: She exhibits pain and spasm.       Back:  Neurological: She is alert and oriented to person, place, and time. She has normal strength. No cranial nerve deficit or sensory deficit. GCS eye subscore is 4. GCS verbal subscore is 5. GCS motor subscore is 6.  Skin: Skin is warm and dry. No abrasion and no rash noted.  Psychiatric: She has a normal mood and affect. Her speech is normal and behavior is normal.    ED Course  Procedures (including critical care time)  Labs Reviewed  URINALYSIS, ROUTINE W REFLEX MICROSCOPIC - Abnormal; Notable for the following:    Leukocytes, UA SMALL (*)    All other components within normal limits  URINE MICROSCOPIC-ADD ON   Dg Lumbar Spine Complete  03/18/2011  *RADIOLOGY REPORT*  Clinical Data: Lower back pain and spasms after fall  LUMBAR SPINE - COMPLETE 4+ VIEW  Comparison: 08/11/2009  Findings: Five lumbar type vertebra.  Slight anterior subluxation of L5 on the sacrum, slight anterior subluxation of L5 on the sacrum, similar to previous study.  Otherwise normal alignment of the lumbar vertebrae.  Degenerative changes in the lumbar facet  joints.  No vertebral compression deformities.  Bone cortex and trabecular architecture appear intact.  No focal bone lesion or bone destruction.  Intervertebral disc space heights are preserved. Extensive soft tissue calcifications over the gluteal regions suggesting injection granulomas.  This is stable.  IMPRESSION: Slight anterior subluxation of L5 on S1, likely due to degenerative change.  No displaced fractures identified.  Original Report Authenticated By: Marlon Pel, M.D.     No diagnosis found.    MDM  Pt given pain meds and feels better--will d/c to home        Toy Baker, MD 03/19/11 [phone]

## 2011-04-04 ENCOUNTER — Ambulatory Visit: Payer: Medicare Other | Admitting: Nurse Practitioner

## 2011-04-06 ENCOUNTER — Encounter (HOSPITAL_COMMUNITY): Payer: Self-pay | Admitting: Emergency Medicine

## 2011-04-06 ENCOUNTER — Emergency Department (HOSPITAL_COMMUNITY)
Admission: EM | Admit: 2011-04-06 | Discharge: 2011-04-06 | Disposition: A | Discharge status: Home / Self Care | Payer: Medicare Other | Attending: Emergency Medicine | Admitting: Emergency Medicine

## 2011-04-06 ENCOUNTER — Emergency Department (HOSPITAL_COMMUNITY): Payer: Medicare Other

## 2011-04-06 DIAGNOSIS — E1149 Type 2 diabetes mellitus with other diabetic neurological complication: Secondary | ICD-10-CM | POA: Insufficient documentation

## 2011-04-06 DIAGNOSIS — G894 Chronic pain syndrome: Secondary | ICD-10-CM | POA: Insufficient documentation

## 2011-04-06 DIAGNOSIS — E785 Hyperlipidemia, unspecified: Secondary | ICD-10-CM | POA: Insufficient documentation

## 2011-04-06 DIAGNOSIS — M545 Low back pain, unspecified: Secondary | ICD-10-CM | POA: Insufficient documentation

## 2011-04-06 DIAGNOSIS — F411 Generalized anxiety disorder: Secondary | ICD-10-CM | POA: Insufficient documentation

## 2011-04-06 DIAGNOSIS — S3992XA Unspecified injury of lower back, initial encounter: Secondary | ICD-10-CM

## 2011-04-06 DIAGNOSIS — L408 Other psoriasis: Secondary | ICD-10-CM | POA: Insufficient documentation

## 2011-04-06 DIAGNOSIS — Z9010 Acquired absence of unspecified breast and nipple: Secondary | ICD-10-CM | POA: Insufficient documentation

## 2011-04-06 DIAGNOSIS — K219 Gastro-esophageal reflux disease without esophagitis: Secondary | ICD-10-CM | POA: Insufficient documentation

## 2011-04-06 DIAGNOSIS — E1142 Type 2 diabetes mellitus with diabetic polyneuropathy: Secondary | ICD-10-CM | POA: Insufficient documentation

## 2011-04-06 DIAGNOSIS — G40909 Epilepsy, unspecified, not intractable, without status epilepticus: Secondary | ICD-10-CM | POA: Insufficient documentation

## 2011-04-06 DIAGNOSIS — IMO0002 Reserved for concepts with insufficient information to code with codable children: Secondary | ICD-10-CM | POA: Insufficient documentation

## 2011-04-06 DIAGNOSIS — Z853 Personal history of malignant neoplasm of breast: Secondary | ICD-10-CM | POA: Insufficient documentation

## 2011-04-06 DIAGNOSIS — Z79899 Other long term (current) drug therapy: Secondary | ICD-10-CM | POA: Insufficient documentation

## 2011-04-06 DIAGNOSIS — Z86718 Personal history of other venous thrombosis and embolism: Secondary | ICD-10-CM | POA: Insufficient documentation

## 2011-04-06 DIAGNOSIS — E2749 Other adrenocortical insufficiency: Secondary | ICD-10-CM | POA: Insufficient documentation

## 2011-04-06 MED ORDER — ONDANSETRON 4 MG PO TBDP
4.0000 mg | ORAL_TABLET | Freq: Once | ORAL | Status: AC
  Administered 2011-04-06: 4 mg via ORAL
  Filled 2011-04-06: qty 1

## 2011-04-06 MED ORDER — HYDROMORPHONE HCL PF 2 MG/ML IJ SOLN
2.0000 mg | Freq: Once | INTRAMUSCULAR | Status: AC
  Administered 2011-04-06: 2 mg via INTRAMUSCULAR
  Filled 2011-04-06: qty 1

## 2011-04-06 NOTE — Discharge Instructions (Signed)

## 2011-04-06 NOTE — ED Notes (Signed)
Went into give patient her Dilaudid and Zofran  PO and she does not want the Zofran PO she wants IM Phenergan because the PO Zofran will not work.  Am looking for PA to see what he says

## 2011-04-06 NOTE — ED Notes (Signed)
Pt alert, nad, c/o low back pain, onset chronic in nature, unknown recent trauma or injury, ambulates to triage, request wheelchair, resp even unlabored, skin pwd

## 2011-04-06 NOTE — ED Provider Notes (Signed)
History     CSN: [id number]  Arrival date & time 04/06/11  4098   First MD Initiated Contact with Patient 04/06/11 [phone]      Chief Complaint  Patient presents with  . Back Pain    (Consider location/radiation/quality/duration/timing/severity/associated sxs/prior treatment) HPI  56 year old female with a fairly extensive medical history including chronic pain syndrome, lupus, chronic back pain and metastatic cancer is present the ED with chief complaints of back pain. Patient states she actually slipped on her kitchen floor and fell backward landed on her butt 2 days ago.  Denies prodromal sxs prior to fall. She denies hitting head or loss of consciousness. She is been experiencing back pain since the fall. Patient states pain is a severe that she has been vomiting multiple times. She denies any significant numbness. Patient states she can walk but walking makes the pain worse. She takes Dilaudid 4 mg home but states her medication has not helped.        Past Medical History  Diagnosis Date  . PERSONALITY DISORDER   . Chronic pain syndrome     chronic narcotics, dependancy hx - [facility] for pain mgmt  . MIGRAINE HEADACHE   . Lyme disease   . ALLERGIC RHINITIS   . ANEMIA-IRON DEFICIENCY   . HYPERLIPIDEMIA   . GERD   . DIABETES MELLITUS, TYPE II   . DEPRESSION   . CARCINOMA, BREAST 08/2009 dx    R breast,  mets to liver - resolution s/p chemo, s/p B mastect  . SLE (systemic lupus erythematosus)     [facility] [place] [facility] [place] - chronic pred  . Psoriasis   . Addison's disease   . Metastases to the liver   . Degenerative joint disease   . DDD (degenerative disc disease)   . Breast cancer     bilateral mastectomy and has mets to liver  . Phlebitis after infusion     left ?calf; "S/P phenergan/demerol injection"  . Blood clot in vein 2011; 02/04/11    "right jugular vein;left jugular vein"  . Angina   . Pleurisy     "alot; put me in hospital q time; related to my SLE"  .  Shortness of breath 02/04/11    "always here lately"  . Hypothyroidism     "took medicine while in hospital 08/2009 only"  . H/O hiatal hernia   . SEIZURE DISORDER     "lots of seizures; due to lupus"  . Ventilator dependent 1980's    "for 3 months S/P seizure"  . Anxiety   . Neuromuscular disorder   . Neuropathy     hands, feet, due to diabetes & back problem  . Herniated disc     "lumbar area w/bulging"  . Syncope and collapse 02/04/11    "not the first time either"  . Surgical wound infection 02/15/2011  . Abscess or cellulitis of chest wall 02/15/2011    Past Surgical History  Procedure Date  . Liver bopsy 09/2009  . Port a cath placement 09/2009; 11/19/10  . Port-a-cath removal 04/2010    "due to staph & strept"  . Port-a-cath removal 12/23/2010    Procedure: REMOVAL PORT-A-CATH;  Surgeon: Valarie Merino, MD;  Location: [facility];  Service: General;  Laterality: Left;  . Breast surgery 09/2009    right breast biopsy  . Mastectomy 11/19/10    bilateral mastectomy   . I&d extremity ~ 2010    right hand; S/P "dog bite"  . Peripherally inserted central catheter  insertion     Family History  Problem Relation Age of Onset  . Lung cancer Mother   . Lung cancer Father   . Arthritis Other   . Diabetes Other   . Hyperlipidemia Other     History  Substance Use Topics  . Smoking status: Never Smoker   . Smokeless tobacco: Never Used  . Alcohol Use: No    OB History    Grav Para Term Preterm Abortions TAB SAB Ect Mult Living                  Review of Systems  All other systems reviewed and are negative.    Allergies  Methotrexate; Versed; Ammonia; Contrast media; Infliximab; Iohexol; Keflex; Ketorolac tromethamine; Nalbuphine; Nsaids; Ultram; and Celecoxib  Home Medications   Current Outpatient Rx  Name Route Sig Dispense Refill  . ACETAMINOPHEN 500 MG PO TABS Oral Take 500 mg by mouth every 6 (six) hours as needed. For headache    . CARISOPRODOL 350 MG PO TABS   TAKE 1 TABLET BY MOUTH FOUR TIMES DAILY AS NEEDED 120 tablet 0  . CLONAZEPAM 1 MG PO TABS Oral Take 1 mg by mouth 2 (two) times daily as needed. For anxiety    . DOCUSATE SODIUM 100 MG PO CAPS Oral Take 200 mg by mouth 2 (two) times daily.     . DULOXETINE HCL 60 MG PO CPEP Oral Take 60 mg by mouth 2 (two) times daily.    . FENTANYL 100 MCG/HR TD PT72 Transdermal Place 1 patch onto the skin every 3 (three) days.    . FENTANYL 50 MCG/HR TD PT72 Transdermal Place 1 patch onto the skin every 3 (three) days.    . FUROSEMIDE 20 MG PO TABS Oral Take 20 mg by mouth 3 (three) times daily.    Marland Kitchen GABAPENTIN 300 MG PO CAPS Oral Take 900 mg by mouth 3 (three) times daily.    Marland Kitchen HYDROCHLOROTHIAZIDE 25 MG PO TABS Oral Take 25 mg by mouth daily.      Marland Kitchen HYDROMORPHONE HCL 4 MG PO TABS Oral Take 4 mg by mouth every 4 (four) hours as needed. For pain.    Marland Kitchen OMEPRAZOLE 40 MG PO CPDR Oral Take 40 mg by mouth every morning.    Marland Kitchen POTASSIUM CHLORIDE CRYS ER 20 MEQ PO TBCR Oral Take 20 mEq by mouth 2 (two) times daily. Or as directed     . PREDNISONE 10 MG PO TABS  TAKE 1 TABLET BY MOUTH EVERY DAY 30 tablet 2  . PROBIOTIC FORMULA PO CAPS Oral Take 1 capsule by mouth daily.     Marland Kitchen PROMETHAZINE HCL 25 MG PO TABS Oral Take 25 mg by mouth every 4 (four) hours as needed. For nausea.    Marland Kitchen SPIRONOLACTONE-HCTZ 25-25 MG PO TABS Oral Take 1 tablet by mouth 2 (two) times daily.      . TRAZODONE HCL 150 MG PO TABS Oral Take 150 mg by mouth at bedtime.     Marland Kitchen EPINEPHRINE 0.3 MG/0.3ML IJ DEVI Intramuscular Inject 0.3 mg into the muscle once as needed. For bee stings.       BP 139/63  Pulse 86  Temp(Src) 98.9 F (37.2 C) (Oral)  Resp 20  Wt 200 lb (90.719 kg)  SpO2 93%  Physical Exam  Nursing note and vitals reviewed. Constitutional: She appears well-developed and well-nourished. No distress.  HENT:  Head: Atraumatic.  Eyes: Conjunctivae are normal.  Neck: Neck supple.  Cardiovascular: Normal  rate and regular rhythm.     Pulmonary/Chest: Effort normal and breath sounds normal. No respiratory distress. She exhibits no tenderness.  Abdominal: Soft. There is no tenderness.  Musculoskeletal:       Right knee: Normal.       Left knee: Normal.       Cervical back: Normal.       Thoracic back: Normal.       Lumbar back: She exhibits tenderness and bony tenderness. She exhibits normal range of motion, no swelling, no edema, no deformity and no laceration.  Skin:       ED Course  Procedures (including critical care time)  Labs Reviewed - No data to display No results found.   No diagnosis found.  Results for orders placed during the hospital encounter of 03/18/11  URINALYSIS, ROUTINE W REFLEX MICROSCOPIC      Component Value Range   Color, Urine YELLOW  YELLOW    APPearance CLEAR  CLEAR    Specific Gravity, Urine 1.008  1.005 - 1.030    pH 6.5  5.0 - 8.0    Glucose, UA NEGATIVE  NEGATIVE (mg/dL)   Hgb urine dipstick NEGATIVE  NEGATIVE    Bilirubin Urine NEGATIVE  NEGATIVE    Ketones, ur NEGATIVE  NEGATIVE (mg/dL)   Protein, ur NEGATIVE  NEGATIVE (mg/dL)   Urobilinogen, UA 0.2  0.0 - 1.0 (mg/dL)   Nitrite NEGATIVE  NEGATIVE    Leukocytes, UA SMALL (*) NEGATIVE   URINE MICROSCOPIC-ADD ON      Component Value Range   Squamous Epithelial / LPF RARE  RARE    WBC, UA 0-2  <3 (WBC/hpf)   Dg Lumbar Spine Complete  04/06/2011  *RADIOLOGY REPORT*  Clinical Data: Low back pain radiating to both legs, history of sciatica  LUMBAR SPINE - COMPLETE 4+ VIEW  Comparison: 08/11/2009  Findings: Five non-rib bearing lumbar vertebrae. Osseous demineralization. Facet degenerative changes lower lumbar spine. Vertebral body and disc space heights maintained. No acute fracture, subluxation or bone destruction. No spondylolysis. Numerous soft tissue calcifications right buttock question related to injection granulomata, prior trauma or fat necrosis.  IMPRESSION: Osseous demineralization. No acute lumbar spine abnormalities  identified.  Original Report Authenticated By: Lollie Marrow, M.D.   Dg Lumbar Spine Complete  03/18/2011  *RADIOLOGY REPORT*  Clinical Data: Lower back pain and spasms after fall  LUMBAR SPINE - COMPLETE 4+ VIEW  Comparison: 08/11/2009  Findings: Five lumbar type vertebra.  Slight anterior subluxation of L5 on the sacrum, slight anterior subluxation of L5 on the sacrum, similar to previous study.  Otherwise normal alignment of the lumbar vertebrae.  Degenerative changes in the lumbar facet joints.  No vertebral compression deformities.  Bone cortex and trabecular architecture appear intact.  No focal bone lesion or bone destruction.  Intervertebral disc space heights are preserved. Extensive soft tissue calcifications over the gluteal regions suggesting injection granulomas.  This is stable.  IMPRESSION: Slight anterior subluxation of L5 on S1, likely due to degenerative change.  No displaced fractures identified.  Original Report Authenticated By: Marlon Pel, M.D.   Dg Shoulder Right  03/19/2011  *RADIOLOGY REPORT*  Clinical Data: Right shoulder pain after fall.  RIGHT SHOULDER - 2+ VIEW  Comparison: 08/12/2009  Findings: The right shoulder appears intact. No evidence of acute fracture or subluxation.  No focal bone lesions.  Bone matrix and cortex appear intact.  No abnormal radiopaque densities in the soft tissues.  No significant change since prior study.  IMPRESSION: No acute bony abnormalities demonstrated.  Original Report Authenticated By: Marlon Pel, M.D.   Ct Head Wo Contrast  03/19/2011  *RADIOLOGY REPORT*  Clinical Data:  Pain and head, right side of neck, right shoulder, and low back after fall.  History of breast cancer.  CT HEAD WITHOUT CONTRAST CT CERVICAL SPINE WITHOUT CONTRAST  Technique:  Multidetector CT imaging of the head and cervical spine was performed following the standard protocol without intravenous contrast.  Multiplanar CT image reconstructions of the cervical  spine were also generated.  Comparison:  CT head 08/11/2009  CT HEAD  Findings: Mild cerebral atrophy.  No mass effect or midline shift. Ventricular dilatation consistent with central atrophy.  No abnormal extra-axial fluid collections.  Gray-white matter junctions are distinct.  Basal cisterns are not effaced.  No evidence of acute intracranial hemorrhage.  No depressed skull fractures.  Visualized paranasal sinuses are not opacified.  No significant changes since the previous study.  IMPRESSION: No evidence of acute intracranial hemorrhage, mass lesion, or acute infarct.  The  CT CERVICAL SPINE  Findings: Visualization of the mid cervical spine is limited due to motion artifact.  Normal alignment of the cervical vertebrae and facet joints.  Degenerative changes in the facet joints. Degenerative disc space narrowing and endplate hypertrophic changes at C5-6, C6-7, and C7-T1 levels.  No vertebral compression deformities.  No prevertebral soft tissue swelling.  Lateral masses of C1 are symmetrical.  The odontoid process appears intact.  No paraspinal soft tissue infiltration.  Bone cortex and trabecular architecture appear intact.  No focal bone lesion or bone destruction identified.  IMPRESSION: Mild degenerative changes.  No displaced fractures identified.  Original Report Authenticated By: Marlon Pel, M.D.   Ct Cervical Spine Wo Contrast  03/19/2011  *RADIOLOGY REPORT*  Clinical Data:  Pain and head, right side of neck, right shoulder, and low back after fall.  History of breast cancer.  CT HEAD WITHOUT CONTRAST CT CERVICAL SPINE WITHOUT CONTRAST  Technique:  Multidetector CT imaging of the head and cervical spine was performed following the standard protocol without intravenous contrast.  Multiplanar CT image reconstructions of the cervical spine were also generated.  Comparison:  CT head 08/11/2009  CT HEAD  Findings: Mild cerebral atrophy.  No mass effect or midline shift. Ventricular dilatation  consistent with central atrophy.  No abnormal extra-axial fluid collections.  Gray-white matter junctions are distinct.  Basal cisterns are not effaced.  No evidence of acute intracranial hemorrhage.  No depressed skull fractures.  Visualized paranasal sinuses are not opacified.  No significant changes since the previous study.  IMPRESSION: No evidence of acute intracranial hemorrhage, mass lesion, or acute infarct.  The  CT CERVICAL SPINE  Findings: Visualization of the mid cervical spine is limited due to motion artifact.  Normal alignment of the cervical vertebrae and facet joints.  Degenerative changes in the facet joints. Degenerative disc space narrowing and endplate hypertrophic changes at C5-6, C6-7, and C7-T1 levels.  No vertebral compression deformities.  No prevertebral soft tissue swelling.  Lateral masses of C1 are symmetrical.  The odontoid process appears intact.  No paraspinal soft tissue infiltration.  Bone cortex and trabecular architecture appear intact.  No focal bone lesion or bone destruction identified.  IMPRESSION: Mild degenerative changes.  No displaced fractures identified.  Original Report Authenticated By: Marlon Pel, M.D.      MDM  Patient with multiple comorbidities and multiple ER visits for various complaints. Her primary complaint today is having  recent fall and worsening low back pain secondary to fall. She describes no symptoms concerning for spinal cord compression. She does states that she has radiating pain to the back of both legs, however because DTR is intact. Patient able to ambulate without difficulty. She does not appears to be in any acute distress. Patient also requests for a blood culture because "I have a positive blood culture from a previous visit".  However in the setting of stable normal vital signs and afebrile, I will defer this to her primary care Dr. for further management.    Today I will obtain an x-ray of her low back due to the fall.  I  will also give IM Dilaudid for pain control as patient is a difficult stick.    9:01 AM X-ray of the low back shows no acute abnormalities. Patient continues to endorse pain, however she has a high pain tolerance and takes high dose of pain medication at home.  Due to her pain from a mechanical fall and her xray is negative.  I suggest pt to f/u with her PCP for further evaluation.        Fayrene Helper, PA-C 04/06/11 [phone]

## 2011-04-08 ENCOUNTER — Telehealth: Payer: Self-pay | Admitting: Internal Medicine

## 2011-04-08 NOTE — Telephone Encounter (Signed)
Received copies from [facility],on 04/08/11. Forwarded  2pages to Dr. Morrison Old review.

## 2011-04-08 NOTE — ED Provider Notes (Signed)
Medical screening examination/treatment/procedure(s) were performed by non-physician practitioner and as supervising physician I was immediately available for consultation/collaboration.   Dietrich Ke A. Patrica Duel, MD 04/08/11 1116

## 2011-04-11 ENCOUNTER — Other Ambulatory Visit: Payer: Self-pay | Admitting: Internal Medicine

## 2011-04-11 NOTE — Telephone Encounter (Signed)
Faxed script back to [facility]... 04/11/11@4 :54pm/LMB

## 2011-04-12 ENCOUNTER — Other Ambulatory Visit: Payer: Self-pay | Admitting: Internal Medicine

## 2011-04-13 ENCOUNTER — Other Ambulatory Visit: Payer: Self-pay | Admitting: Internal Medicine

## 2011-04-13 NOTE — Telephone Encounter (Signed)
Notified pharmacy refill was fax on Monday per pharmacist never received fax. Gave authorization over phone updated EPIC... 04/13/11@11 :20am/LMB

## 2011-04-22 ENCOUNTER — Other Ambulatory Visit: Payer: Self-pay | Admitting: Internal Medicine

## 2011-04-22 ENCOUNTER — Encounter (INDEPENDENT_AMBULATORY_CARE_PROVIDER_SITE_OTHER): Payer: Medicare Other | Admitting: General Surgery

## 2011-04-22 ENCOUNTER — Telehealth (INDEPENDENT_AMBULATORY_CARE_PROVIDER_SITE_OTHER): Payer: Self-pay

## 2011-04-22 NOTE — Telephone Encounter (Signed)
Returned Ms. Gros call made appointment to follow up with Dr. Johna Sheriff Friday 04/29/11 @ 9:30 for evaluation of possible shoulder abscess.

## 2011-04-22 NOTE — Telephone Encounter (Signed)
Pt called stating she thinks shoulder wound is getting infected again with fever and odor. Pt wanted to wait until next week to be seen by Dr Johna Sheriff.  Pt advised that if this area is feverish and has a foul odor  she will need to go to urgent care or ER to r/o infection and call out office on Monday morning to be seen. Pt states she understands and will do this.

## 2011-04-27 ENCOUNTER — Telehealth: Payer: Self-pay | Admitting: *Deleted

## 2011-04-27 DIAGNOSIS — G894 Chronic pain syndrome: Secondary | ICD-10-CM

## 2011-04-27 NOTE — Telephone Encounter (Signed)
Left msg on vm switch pain clinic and doesn't have one at this time.Requesting md to refer her to [facility] on [address]. The # [phone].... 04/27/11@2 :48pm/LMB

## 2011-04-27 NOTE — Telephone Encounter (Signed)
Will do! thanks

## 2011-04-28 ENCOUNTER — Ambulatory Visit: Payer: Medicare Other | Admitting: Internal Medicine

## 2011-04-28 NOTE — Telephone Encounter (Signed)
Pt notified... 04/28/11@9 :32am/LMB

## 2011-04-29 ENCOUNTER — Ambulatory Visit (INDEPENDENT_AMBULATORY_CARE_PROVIDER_SITE_OTHER): Payer: Medicare Other | Admitting: General Surgery

## 2011-04-29 VITALS — BP 138/84 | HR 72 | Temp 97.1°F | Resp 16 | Ht 65.0 in | Wt 199.2 lb

## 2011-04-29 DIAGNOSIS — L02419 Cutaneous abscess of limb, unspecified: Secondary | ICD-10-CM

## 2011-04-29 DIAGNOSIS — C50919 Malignant neoplasm of unspecified site of unspecified female breast: Secondary | ICD-10-CM

## 2011-04-29 DIAGNOSIS — IMO0002 Reserved for concepts with insufficient information to code with codable children: Secondary | ICD-10-CM

## 2011-04-29 NOTE — Progress Notes (Signed)
Chief complaint: Followup of right shoulder abscess and breast cancer.  History: Patient returns to the office. She was last here in January by Dr. Jamey Ripa did incision and drainage of a right shoulder abscess. She has a history of bilateral mastectomy for breast cancer with known metastatic disease last fall. She had a lot of problems with wound infection and line infections. She currently has no lines in place. She states she has had fever subjectively. She is on antibiotics currently. She states the wound on her shoulders not doing well.  Exam: BP 138/84  Pulse 72  Temp(Src) 97.1 F (36.2 C) (Temporal)  Resp 16  Ht 5\' 5"  (1.651 m)  Wt 199 lb 3.2 oz (90.357 kg)  BMI 33.15 kg/m2  Gen.: She appears well in no distress Skin: On the top of the right shoulder it behind the site is a 1.0 x 0.5 cm area of very clean granulation tissue with some skin ingrowth from the edges. No evidence of infection. Lymph nodes: No palpable cervical, supraclavicular, or axillary nodes Chest wall: A lateral mastectomy incision is well-healed without mass or skin change  System plan: #1 metastatic breast cancer, status post bilateral mastectomy. She has not been back to the oncologist in a couple of months. We will contact the cancer center to make sure she has an appointment in the near future. #2 right shoulder wound status post I&D. This is very clean without evidence of infection. Continue current wet to dry dressings. Posterior back in 6 weeks.

## 2011-05-04 ENCOUNTER — Telehealth: Payer: Self-pay | Admitting: *Deleted

## 2011-05-04 ENCOUNTER — Other Ambulatory Visit: Payer: Self-pay | Admitting: Oncology

## 2011-05-04 ENCOUNTER — Other Ambulatory Visit: Payer: Self-pay | Admitting: Internal Medicine

## 2011-05-04 DIAGNOSIS — C50919 Malignant neoplasm of unspecified site of unspecified female breast: Secondary | ICD-10-CM

## 2011-05-04 NOTE — Telephone Encounter (Signed)
Noted - thx.   PCC: Please be sure pt is aware of same - thanks

## 2011-05-04 NOTE — Telephone Encounter (Signed)
[facility] [place] is unwilling to see pt because of past misuse of controlled substances.

## 2011-05-05 ENCOUNTER — Ambulatory Visit: Payer: Medicare Other | Admitting: Internal Medicine

## 2011-05-05 ENCOUNTER — Telehealth: Payer: Self-pay | Admitting: *Deleted

## 2011-05-05 DIAGNOSIS — Z0289 Encounter for other administrative examinations: Secondary | ICD-10-CM

## 2011-05-05 NOTE — Telephone Encounter (Signed)
left voice message to inform the patient of the new date and time 05-11-2011 starting at 2:45pm with lisa thomas

## 2011-05-09 ENCOUNTER — Encounter: Payer: Self-pay | Admitting: Internal Medicine

## 2011-05-09 ENCOUNTER — Ambulatory Visit (INDEPENDENT_AMBULATORY_CARE_PROVIDER_SITE_OTHER): Payer: Medicare Other | Admitting: Internal Medicine

## 2011-05-09 VITALS — BP 132/80 | HR 79 | Temp 98.6°F | Ht 66.0 in | Wt 199.1 lb

## 2011-05-09 DIAGNOSIS — G894 Chronic pain syndrome: Secondary | ICD-10-CM

## 2011-05-09 DIAGNOSIS — N390 Urinary tract infection, site not specified: Secondary | ICD-10-CM

## 2011-05-09 DIAGNOSIS — F329 Major depressive disorder, single episode, unspecified: Secondary | ICD-10-CM

## 2011-05-09 LAB — POCT URINALYSIS DIPSTICK
Bilirubin, UA: NEGATIVE
Ketones, UA: NEGATIVE
Protein, UA: NEGATIVE

## 2011-05-09 MED ORDER — FUROSEMIDE 20 MG PO TABS: 20.0000 mg | ORAL_TABLET | Freq: Two times a day (BID) | ORAL | Status: DC

## 2011-05-09 MED ORDER — OMEPRAZOLE 40 MG PO CPDR: 40.0000 mg | DELAYED_RELEASE_CAPSULE | Freq: Two times a day (BID) | ORAL | Status: DC

## 2011-05-09 MED ORDER — CIPROFLOXACIN HCL 500 MG PO TABS: 500.0000 mg | ORAL_TABLET | Freq: Two times a day (BID) | ORAL | Status: AC

## 2011-05-09 NOTE — Assessment & Plan Note (Signed)
Long hx same complicated with personality disorder and medical comorbidities Reports long use of bid klonopin - rx adjustment done Cont cymbalta, trazadone and BZ - refills provided Refer to new psychiatrist

## 2011-05-09 NOTE — Progress Notes (Signed)
Subjective:    Patient ID: [name] Carr, female    DOB: 08-Dec-1955, 56 y.o.   MRN: [id number]  HPI  Here for follow up - reviewed interval events and chronic medical issues:  R shoulder abscess 02/2011 - s/p ER visit and surgical I&D - new PICC line in RUE and s/p IV antibiotics at direction of ID - no fever -unable to tolerate antibiotics due to nausea  DVT L IJ 12/26/10- related to PICC complication - on LMWH for same since dx and planned 6 weeks tx, but stopped 01/2011 due to nose bleed from septal perforation (eval by ENT for same, IP and OP) - LUE doppler with no acute clot on follow up imaging  R Breast cancer dx 08/2009 - hx liver mets, resolved s/p chemo (initially Cytoxan + Taxotere, then Abraxane)- Initial biopsy of her breast revealed 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 PET scan revealed liver involvement and biopsy confirmed poorly differentiated adenocarcinoma consistent with metastatic breast. Hx prior Port-A-Cath and PICC lines removed due to infection. Persisting clinical concerns re: possible self manipulation of these lines. Onc ([facility]) rx'd oral Xeloda, but not tolerated med due to nausea.   Chronic pain - previously managed by pain clinic Dr. Nilsa Nutting, dismissed 02/2011 - reports remotely on home self administered narcotic injections and still wishes to resume same due to nausea and vomiting and difficulty keeping pills down - duragesic patches "helps but not enough" - also on cymbalta  SLE - hx pericarditis - on chronic prednisone for same -   follows periodically with rheum at [facility]- no fever, no new rash   Psoriasis - chronic plaque L elbow - uses steroid cream for same  Past Medical History  Diagnosis Date  . PERSONALITY DISORDER   . Chronic pain syndrome     chronic narcotics, dependancy hx - [facility] for pain mgmt  . MIGRAINE HEADACHE   . Lyme disease   . ALLERGIC RHINITIS   . ANEMIA-IRON DEFICIENCY   . HYPERLIPIDEMIA     . GERD   . DIABETES MELLITUS, TYPE II   . DEPRESSION   . CARCINOMA, BREAST 08/2009 dx    R breast,  mets to liver - resolution s/p chemo, s/p B mastect  . SLE (systemic lupus erythematosus)     [facility] - chronic pred  . Psoriasis   . Addison's disease   . Metastases to the liver   . Degenerative joint disease   . DDD (degenerative disc disease)   . Phlebitis after infusion     left ?calf; "S/P phenergan/demerol injection"  . Blood clot in vein 2011; 02/04/11    "right jugular vein;left jugular vein"  . Hypothyroidism   . SEIZURE DISORDER     "lots of seizures; due to lupus"  . Anxiety   . Neuropathy     hands, feet, due to diabetes & back problem  . Surgical wound infection 02/15/2011  . Abscess or cellulitis of chest wall 02/15/2011    Review of Systems  Constitutional: Positive for fatigue. Negative for fever and unexpected weight change.  Respiratory: Negative for cough and shortness of breath.   Gastrointestinal: Negative for blood in stool.  Genitourinary: Positive for dysuria and frequency.  Neurological: Negative for dizziness.       Objective:   Physical Exam  BP 132/80  Pulse 79  Temp(Src) 98.6 F (37 C) (Oral)  Ht 5\' 6"  (1.676 m)  Wt 199 lb 1.9 oz (90.32 kg)  BMI 32.14 kg/m2  SpO2 95% Wt Readings from Last 3 Encounters:  05/09/11 199 lb 1.9 oz (90.32 kg)  04/29/11 199 lb 3.2 oz (90.357 kg)  04/06/11 200 lb (90.719 kg)   Constitutional: She is obese; appears well-developed and well-nourished. No distress. Cardiovascular: Normal rate, regular rhythm and normal heart sounds.  No murmur heard. no BLE edema. Pulmonary/Chest: Effort normal and breath sounds normal. No respiratory distress. She has no wheezes.  Psychiatric: She has a normal mood and nonchalant affect. Her behavior is normal. Judgment and thought content normal.   Lab Results  Component Value Date   WBC 10.0 02/28/2011   HGB 8.3* 02/28/2011   HCT 27.0* 02/28/2011   PLT 220  02/28/2011   GLUCOSE 85 02/28/2011   CHOL 272 08/09/2009   TRIG 109 08/08/2009   HDL 88 08/09/2009   LDLCALC 409 08/09/2009   ALT 9 02/15/2011   AST 11 02/15/2011   NA 136 02/28/2011   K 3.3* 02/28/2011   CL 95* 02/28/2011   CREATININE 0.96 02/28/2011   BUN 14 02/28/2011   CO2 29 02/28/2011   TSH 3.428 02/05/2011   INR 1.02 02/04/2011   HGBA1C 6.1* 12/26/2010        Assessment & Plan:  See problem list. Medications and labs reviewed today.  UTI - LE on Udip -empiric Cipro - erx done

## 2011-05-09 NOTE — Patient Instructions (Signed)
It was good to see you today. We have reviewed event of the past 3 months -  Use Cipro antibiotics for UTI and continue to followup with cancer specialists as ongoing Increase omeprazole to 2x/day and we'll make referral to new pain mgmt and psychiatry. Our office will contact you regarding appointment(s) once made. Please keep scheduled followup in 3-4 months, call sooner if problems.

## 2011-05-09 NOTE — Assessment & Plan Note (Signed)
Narcotic dependency - prev followed with pain mgmt for same - [facility], but dismissed 02/2011 Will refer to new clinic I declined to increase Klonopin dose or refill Duragesic as requested today

## 2011-05-11 ENCOUNTER — Ambulatory Visit (HOSPITAL_BASED_OUTPATIENT_CLINIC_OR_DEPARTMENT_OTHER): Payer: Medicare Other | Admitting: Nurse Practitioner

## 2011-05-11 ENCOUNTER — Other Ambulatory Visit: Payer: Self-pay | Admitting: Internal Medicine

## 2011-05-11 ENCOUNTER — Telehealth: Payer: Self-pay | Admitting: Oncology

## 2011-05-11 ENCOUNTER — Other Ambulatory Visit: Payer: Self-pay

## 2011-05-11 ENCOUNTER — Ambulatory Visit: Payer: Medicare Other | Admitting: Oncology

## 2011-05-11 VITALS — BP 113/76 | HR 81 | Temp 99.6°F | Ht 66.0 in | Wt 199.4 lb

## 2011-05-11 DIAGNOSIS — C801 Malignant (primary) neoplasm, unspecified: Secondary | ICD-10-CM

## 2011-05-11 DIAGNOSIS — C787 Secondary malignant neoplasm of liver and intrahepatic bile duct: Secondary | ICD-10-CM

## 2011-05-11 DIAGNOSIS — C50919 Malignant neoplasm of unspecified site of unspecified female breast: Secondary | ICD-10-CM

## 2011-05-11 DIAGNOSIS — G894 Chronic pain syndrome: Secondary | ICD-10-CM

## 2011-05-11 MED ORDER — HYDROCODONE-ACETAMINOPHEN 10-325 MG PO TABS: 1.0000 | ORAL_TABLET | Freq: Four times a day (QID) | ORAL | Status: DC | PRN

## 2011-05-11 NOTE — Progress Notes (Signed)
OFFICE PROGRESS NOTE  Interval history:  [name] Carr is a 56 year old woman with metastatic breast cancer involving liver. A brief trial of the Xeloda was poorly tolerated. Course recently has been complicated by recurrent soft tissue infections initially of the chest wall and then the right shoulder.  [name] Carr reports the abscess at the right shoulder has healed. The chest wall has also healed. She was recently started on a course of oral ciprofloxacin for a "sinus infection and a urinary tract infection". She has had some low-grade fevers.  She reports that she was recently discharged from a pain clinic due to emergency room visits and obtaining pain medication from another office following her mastectomy. She began Suboxone on 05/09/2011 for "withdrawal". She is no longer on a Duragesic patch and has no breakthrough pain medication. She has pain at multiple sites including the back, legs and abdomen. She attributes the pain to arthritis, lupus and degenerative disc disease.  She continues to have intermittent nausea/vomiting. She reports her weight is stable.   Objective: Blood pressure 113/76, pulse 81, temperature 99.6 F (37.6 C), temperature source Oral, height 5\' 6"  (1.676 m), weight 199 lb 6.4 oz (90.447 kg).  Oropharynx is without thrush or ulceration. No palpable cervical, supraclavicular or axillary lymph nodes. Status post bilateral mastectomy. No evidence of chest wall recurrence. Lungs are clear. No wheezes or rales. Regular cardiac rhythm. Abdomen is soft with generalized tenderness most pronounced at the right upper cautery. No hepatomegaly. Extremities are without edema. Calves are soft and nontender. Motor strength is intact. Approximate 3 cm scar at the right shoulder.  Lab Results: Lab Results  Component Value Date   WBC 10.0 02/28/2011   HGB 8.3* 02/28/2011   HCT 27.0* 02/28/2011   MCV 76.1* 02/28/2011   PLT 220 02/28/2011    Chemistry:    Chemistry      Component  Value Date/Time   NA 136 02/28/2011 2232   K 3.3* 02/28/2011 2232   CL 95* 02/28/2011 2232   CO2 29 02/28/2011 2232   BUN 14 02/28/2011 2232   CREATININE 0.96 02/28/2011 2232      Component Value Date/Time   CALCIUM 9.3 02/28/2011 2232   ALKPHOS 98 02/15/2011 1521   AST 11 02/15/2011 1521   ALT 9 02/15/2011 1521   BILITOT 0.2* 02/15/2011 1521       Studies/Results: No results found.  Medications: I have reviewed the patient's current medications.  Assessment/Plan:  1. 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 Initially treated with Taxotere and Cytoxan for 3 cycles. Subsequent Abraxane given October 2011 through March 2012. Brief 2 week trial of Xeloda given through our office in September 2012 with discontinuation due to poor GI tolerance. 2. Status post bilateral simple mastectomies 11/19/2010. 3. Extensive chest wall cellulitis hospitalized 11/30/2010 through 12/08/2010. She was treated with IV vancomycin and Zosyn. 4. Status post Port-A-Cath placement 11/19/2010. The Port-A-Cath was removed on 12/23/2010 at which time she was hospitalized for confusion and fever. 5. Right shoulder abscess January 2013 status post incision and drainage. 6. SLE. 7. Chronic pain syndrome. She is no longer followed by Dr. Laury Axon. 8. Narcotic dependence. 9. Personality disorder. 10. 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 11. Chronic nausea/vomiting.  Disposition-with regard to the metastatic breast cancer Dr.Granfortuna recommends a restaging CT evaluation and PET scan (the CT scan will be done without  contrast due to a contrast allergy). He will see [name] Carr in followup on 05/31/2011 to review the results and discuss possible systemic therapy. With regard to the chronic pain syndrome, Dr. Cyndie Chime agreed to prescribe hydrocodone/APAP 10/325 one tablet every 6 hours as needed with  120 tablets to be dispensed (4 week supply). We outlined to [name] Carr that no refills would be given earlier than the 4 week time frame and she should not obtain pain medication from any other providers. She is aware that she needs to contact the office at least 24 hours in advance for a refill.  She will contact the office prior to her next visit with any problems.  Patient seen with Dr. Cyndie Chime.   Lonna Cobb ANP/GNP-BC

## 2011-05-11 NOTE — Telephone Encounter (Signed)
Faxed script back to [facility]... 05/11/11@1 :21am/LMB

## 2011-05-11 NOTE — Telephone Encounter (Signed)
gve the pt her April 2013 appt calendar along with the pet scan/ct scan appt with instructions.

## 2011-05-16 ENCOUNTER — Encounter: Payer: Self-pay | Admitting: Physical Medicine and Rehabilitation

## 2011-05-20 ENCOUNTER — Telehealth: Payer: Self-pay | Admitting: *Deleted

## 2011-05-20 ENCOUNTER — Encounter (HOSPITAL_COMMUNITY)
Admission: RE | Admit: 2011-05-20 | Discharge: 2011-05-20 | Disposition: A | Discharge status: Home / Self Care | Payer: Medicare Other | Source: Ambulatory Visit | Attending: Interventional Radiology | Admitting: Interventional Radiology

## 2011-05-20 ENCOUNTER — Other Ambulatory Visit (HOSPITAL_COMMUNITY): Payer: Self-pay | Admitting: Interventional Radiology

## 2011-05-20 ENCOUNTER — Encounter (HOSPITAL_COMMUNITY)
Admission: RE | Admit: 2011-05-20 | Discharge: 2011-05-20 | Disposition: A | Discharge status: Home / Self Care | Payer: Medicare Other | Source: Ambulatory Visit | Attending: Nurse Practitioner | Admitting: Nurse Practitioner

## 2011-05-20 ENCOUNTER — Encounter (HOSPITAL_COMMUNITY): Payer: Self-pay

## 2011-05-20 ENCOUNTER — Other Ambulatory Visit (HOSPITAL_BASED_OUTPATIENT_CLINIC_OR_DEPARTMENT_OTHER): Payer: Medicare Other | Admitting: Lab

## 2011-05-20 DIAGNOSIS — K449 Diaphragmatic hernia without obstruction or gangrene: Secondary | ICD-10-CM | POA: Insufficient documentation

## 2011-05-20 DIAGNOSIS — R911 Solitary pulmonary nodule: Secondary | ICD-10-CM | POA: Insufficient documentation

## 2011-05-20 DIAGNOSIS — C50419 Malignant neoplasm of upper-outer quadrant of unspecified female breast: Secondary | ICD-10-CM

## 2011-05-20 DIAGNOSIS — I878 Other specified disorders of veins: Secondary | ICD-10-CM

## 2011-05-20 DIAGNOSIS — C50919 Malignant neoplasm of unspecified site of unspecified female breast: Secondary | ICD-10-CM

## 2011-05-20 DIAGNOSIS — Z9010 Acquired absence of unspecified breast and nipple: Secondary | ICD-10-CM | POA: Insufficient documentation

## 2011-05-20 DIAGNOSIS — Z79899 Other long term (current) drug therapy: Secondary | ICD-10-CM | POA: Insufficient documentation

## 2011-05-20 LAB — CBC WITH DIFFERENTIAL/PLATELET
Basophils Absolute: 0.1 10*3/uL (ref 0.0–0.1)
EOS%: 1.2 % (ref 0.0–7.0)
Eosinophils Absolute: 0.1 10*3/uL (ref 0.0–0.5)
HGB: 9.2 g/dL — ABNORMAL LOW (ref 11.6–15.9)
LYMPH%: 9.5 % — ABNORMAL LOW (ref 14.0–49.7)
MCH: 21.8 pg — ABNORMAL LOW (ref 25.1–34.0)
MCV: 70.2 fL — ABNORMAL LOW (ref 79.5–101.0)
MONO%: 5.7 % (ref 0.0–14.0)
NEUT#: 7.9 10*3/uL — ABNORMAL HIGH (ref 1.5–6.5)
NEUT%: 83.1 % — ABNORMAL HIGH (ref 38.4–76.8)
Platelets: 198 10*3/uL (ref 145–400)

## 2011-05-20 LAB — COMPREHENSIVE METABOLIC PANEL
AST: 20 U/L (ref 0–37)
Alkaline Phosphatase: 101 U/L (ref 39–117)
BUN: 26 mg/dL — ABNORMAL HIGH (ref 6–23)
Glucose, Bld: 143 mg/dL — ABNORMAL HIGH (ref 70–99)
Potassium: 2.7 mEq/L — CL (ref 3.5–5.3)
Sodium: 128 mEq/L — ABNORMAL LOW (ref 135–145)
Total Bilirubin: 0.3 mg/dL (ref 0.3–1.2)

## 2011-05-20 MED ORDER — FLUDEOXYGLUCOSE F - 18 (FDG) INJECTION
17.7000 | Freq: Once | INTRAVENOUS | Status: AC | PRN
  Administered 2011-05-20: 17.7 via INTRAVENOUS

## 2011-05-20 NOTE — Telephone Encounter (Signed)
Her K+ level is dangerously low, pls refer her to the ER for consideration of some IV fluids with K+

## 2011-05-20 NOTE — Telephone Encounter (Signed)
Called pt no answer LMOM please return call ASAP concerning labs that was done @ cancer center... 05/20/11@3 :47pm/LMB

## 2011-05-20 NOTE — Telephone Encounter (Signed)
Notified pt with md advisement she agreed and stated she would go to [facility] since she is back in [place]... 05/20/11@4 ;37pm/LMB

## 2011-05-20 NOTE — Telephone Encounter (Signed)
Received fax potassium 2.7 & sodium 128. Currently taking spironalactone 25/25 daily, lasix 20 mg bid, and K 20mg  bid or as directed. Wanting to know what md recommend. MD is out of office pls advise on what pr should do... 05/20/11@1 :52pm/LMB

## 2011-05-26 ENCOUNTER — Other Ambulatory Visit: Payer: Self-pay | Admitting: Internal Medicine

## 2011-05-26 MED ORDER — FUROSEMIDE 20 MG PO TABS: 20.0000 mg | ORAL_TABLET | Freq: Two times a day (BID) | ORAL | Status: DC

## 2011-05-31 ENCOUNTER — Ambulatory Visit (HOSPITAL_BASED_OUTPATIENT_CLINIC_OR_DEPARTMENT_OTHER): Payer: Medicare Other | Admitting: Oncology

## 2011-05-31 VITALS — BP 119/72 | HR 78 | Temp 97.8°F | Ht 66.0 in | Wt 208.3 lb

## 2011-05-31 DIAGNOSIS — C787 Secondary malignant neoplasm of liver and intrahepatic bile duct: Secondary | ICD-10-CM

## 2011-05-31 DIAGNOSIS — D509 Iron deficiency anemia, unspecified: Secondary | ICD-10-CM

## 2011-05-31 DIAGNOSIS — D510 Vitamin B12 deficiency anemia due to intrinsic factor deficiency: Secondary | ICD-10-CM

## 2011-05-31 DIAGNOSIS — C50919 Malignant neoplasm of unspecified site of unspecified female breast: Secondary | ICD-10-CM

## 2011-06-01 ENCOUNTER — Telehealth: Payer: Self-pay | Admitting: Oncology

## 2011-06-01 NOTE — Progress Notes (Signed)
Hematology and Oncology Follow Up Visit  [name] Carr [id number] [birth year] 56 y.o. 06/01/2011 11:17 AM   Principle Diagnosis: Encounter Diagnoses  Name Primary?  Gustavo Lah DEFICIENCY Yes  . Breats Ca, 08/2009 - hx liver mets, resolved s/p chemo   . PA (pernicious anemia)      Interim History:   Short interim followup visit for this medically and socially complicated 56 year old woman to review results of recent restaging evaluation of metastatic breast cancer. Initial diagnosis and treatment done in [place]. She had a large lesion in her right breast. Biopsies on 08/27/2009 showed high-grade invasive carcinoma triple negative. A PET scan showed activity over this lesion and in addition several small lesions seen in the right lobe of her liver largest 1.2 x 0.6 cm. Liver biopsy 09/21/2009 positive for adenocarcinoma. She received initial chemotherapy with Cytoxan plus Taxotere for 3 cycles with a dramatic response but poor tolerance. She was then treated with Abraxane between 12/07/2009 and 04/27/2010 when she elected to stop all treatments for poor tolerance. She was discharged from her oncology practice in [place] and first saw me 02/04/2011. She was put on a brief trial of oral Xeloda which was also stopped do to poor tolerance. A CT scan of the abdomen and pelvis done 06/24/2010 showed a normal-appearing liver without any lesions. Mammograms and MRI of the breast showed residual tumor in the right breast. She elected to have bilateral mastectomies. She ran into a number of postoperative complications with wound infection and infection at the site of a Port-A-Cath infusion device that was placed at time of the initial surgery. She had multiple hospitalizations related to these infections and additional infections of a PICC catheter placed after the Port-A-Cath was removed.  She has complex social issues. She has an underlying rheumatologic disorder probably lupus. She has a chronic  pain syndrome and has become addicted to narcotic analgesics. She has had multiple frequent visits to emergency departments in both [place] and [place] seeking pain medications which was one of the reasons why she was discharged from the [place] oncology practice.  She has exhausted vascular access and the prospect of going back on a chemotherapy program in face of multiple recent infections related to indwelling catheters was daunting. I elected to completely restage her with a CT scan chest abdomen pelvis and a PET scan done 05/20/2011 and I personally reviewed these images. Although to my eye there is significant background activity throughout the liver, radiologist does not feel that there is any focal abnormal metabolic uptake in the liver or anywhere else in her body to suggest active disease at this time. The patient was ecstatic to hear this news. She understands that this cancer is likely not cured and that she will likely need treatment in the future but for the time being, and given the fact that she has essentially been off all chemotherapy since March of 2012, with no evidence for active disease on current studies, I think it is reasonable to follow her with close observation alone.  Medications: reviewed  Allergies:  Allergies  Allergen Reactions  . Methotrexate Anaphylaxis  . Versed Anaphylaxis  . Ammonia Other (See Comments)    Ended up on ventilator from ammonia tabs  . Contrast Media (Iodinated Diagnostic Agents) Other (See Comments)    Doesn't breath well.  . Infliximab Nausea And Vomiting  . Iohexol Other (See Comments)    SOB   . Keflex Nausea And Vomiting and Other (See Comments)  Blisters.  . Ketorolac Tromethamine Other (See Comments)    Injectable doesn't work and pill hurts stomach.  . Nalbuphine Other (See Comments)    Unknown.  . Nsaids Nausea And Vomiting  . Ultram (Tramadol Hcl)   . Celecoxib Rash    Review of Systems: Constitutional:   Chronic  fatigue. Chronic myalgias and arthralgias. Respiratory: No cough or dyspnea Cardiovascular:  No chest pain or palpitations Gastrointestinal: No change in bowel habit Genito-Urinary: No urinary tract symptoms. No vaginal bleeding Musculoskeletal: Chronic musculoskeletal pain nothing new nothing focal Neurologic: No headache or change in vision Skin: No rash or ecchymosis Remaining ROS negative.  Physical Exam: Blood pressure 119/72, pulse 78, temperature 97.8 F (36.6 C), temperature source Oral, height 5\' 6"  (1.676 m), weight 208 lb 4.8 oz (94.484 kg). Wt Readings from Last 3 Encounters:  05/31/11 208 lb 4.8 oz (94.484 kg)  05/11/11 199 lb 6.4 oz (90.447 kg)  05/09/11 199 lb 1.9 oz (90.32 kg)     General appearance: Well-nourished Caucasian woman HENNT: Pharynx no erythema or exudate Lymph nodes: No cervical supraclavicular or axillary adenopathy Breasts: Bilateral mastectomies. No chest wall lesions. completely healed infections of the chest wall bilaterally Lungs: Clear to auscultation resonant to percussion Heart: Regular rhythm no murmur Abdomen: Soft nontender Extremities: No edema no calf tenderness Vascular: No cyanosis Neurologic: Motor strength 5 over 5 Skin: No rash, ecchymosis, or active infection  Lab Results: Lab Results  Component Value Date   WBC 9.5 05/20/2011   HGB 9.2* 05/20/2011   HCT 29.8* 05/20/2011   MCV 70.2* 05/20/2011   PLT 198 05/20/2011     Chemistry      Component Value Date/Time   NA 128* 05/20/2011 1143   K 2.7* 05/20/2011 1143   CL 83* 05/20/2011 1143   CO2 32 05/20/2011 1143   BUN 26* 05/20/2011 1143   CREATININE 1.38* 05/20/2011 1143      Component Value Date/Time   CALCIUM 8.8 05/20/2011 1143   ALKPHOS 101 05/20/2011 1143   AST 20 05/20/2011 1143   ALT 13 05/20/2011 1143   BILITOT 0.3 05/20/2011 1143       Radiological Studies: See discussion above   Impression and Plan: #1. Breast cancer with limited liver metastases Complete  response to initial chemotherapy given over one year ago. Currently no active metabolic disease or gross anatomic disease on restaging CT and PET scans as outlined above. Plan at this time is close observation alone. Reinitiate chemotherapy when the disease becomes active again.  #2. Status post bilateral mastectomies  #3. Status post initial chest wall cellulitis and infection at site of left subclavian Port-A-Cath treated and resolved. Subsequent infection of a right PICC catheter treated and resolved.  #4. Likely lupus  #5. Narcotic habituation  #6. Probable bipolar disorder   CC:. Dr. Rene Paci   Levert Feinstein, MD 4/24/201311:17 AM

## 2011-06-01 NOTE — Telephone Encounter (Signed)
Gave appt for lab on August 20th lab and see MD a few days later, pt is a little bit confused.

## 2011-06-02 ENCOUNTER — Other Ambulatory Visit: Payer: Self-pay | Admitting: Internal Medicine

## 2011-06-02 ENCOUNTER — Other Ambulatory Visit: Payer: Self-pay

## 2011-06-02 DIAGNOSIS — R112 Nausea with vomiting, unspecified: Secondary | ICD-10-CM

## 2011-06-02 DIAGNOSIS — C801 Malignant (primary) neoplasm, unspecified: Secondary | ICD-10-CM

## 2011-06-02 MED ORDER — PROMETHAZINE HCL 25 MG PO TABS: 25.0000 mg | ORAL_TABLET | ORAL | Status: DC | PRN

## 2011-06-02 MED ORDER — HYDROCODONE-ACETAMINOPHEN 10-325 MG PO TABS: 1.0000 | ORAL_TABLET | Freq: Four times a day (QID) | ORAL | Status: DC | PRN

## 2011-06-02 MED ORDER — POLYSACCHARIDE IRON COMPLEX 150 MG PO CAPS: 150.0000 mg | ORAL_CAPSULE | Freq: Two times a day (BID) | ORAL | Status: DC

## 2011-06-02 MED ORDER — CYANOCOBALAMIN 1000 MCG PO TABS: 1000.0000 ug | ORAL_TABLET | Freq: Every day | ORAL | Status: DC

## 2011-06-02 MED ORDER — PROMETHAZINE HCL 25 MG RE SUPP: 25.0000 mg | RECTAL | Status: DC | PRN

## 2011-06-02 NOTE — Telephone Encounter (Signed)
Ok done

## 2011-06-02 NOTE — Telephone Encounter (Signed)
Pt requesting phenergan suppository and by mouth---[facility] [address] in [place]--pt ph# [phone]

## 2011-06-02 NOTE — Telephone Encounter (Signed)
Pt notiifed meds sent to [facility],,,06/02/11@4 :21pm/LMB

## 2011-06-09 ENCOUNTER — Other Ambulatory Visit: Payer: Self-pay | Admitting: Internal Medicine

## 2011-06-09 NOTE — Telephone Encounter (Signed)
Called refill into pharmacy spoke with Lorin Picket ok x's 1 only...06/09/11@3 :46pm/LMB

## 2011-06-10 ENCOUNTER — Encounter: Payer: Medicare Other | Admitting: Physical Medicine and Rehabilitation

## 2011-06-16 ENCOUNTER — Other Ambulatory Visit: Payer: Self-pay

## 2011-06-16 NOTE — Telephone Encounter (Signed)
Pt called c/o of severe sinus HA, temp 99.8, facial pain, and nasal discharge (yellow-green). Pt states that she is unable to come in for OV tomorrow 05/10 due to transpiration problems. Pt is requesting Rx for Cipro to her pharmacy, please advise. Pt aware MD out of office until 05/10.

## 2011-06-17 MED ORDER — LEVOFLOXACIN 500 MG PO TABS: 500.0000 mg | ORAL_TABLET | Freq: Every day | ORAL | Status: AC

## 2011-06-17 NOTE — Telephone Encounter (Signed)
Pt informed

## 2011-06-17 NOTE — Telephone Encounter (Signed)
erx Levaquin 500mg  qd x 10d - done - Levaquin better for sinus than cipro but same family as cipro - thanks

## 2011-06-20 ENCOUNTER — Other Ambulatory Visit: Payer: Self-pay | Admitting: Internal Medicine

## 2011-06-21 ENCOUNTER — Other Ambulatory Visit: Payer: Self-pay | Admitting: *Deleted

## 2011-06-21 MED ORDER — PREDNISONE 10 MG PO TABS: 10.0000 mg | ORAL_TABLET | Freq: Every day | ORAL | Status: DC

## 2011-06-27 ENCOUNTER — Telehealth: Payer: Self-pay | Admitting: Oncology

## 2011-06-27 ENCOUNTER — Telehealth: Payer: Self-pay

## 2011-06-27 NOTE — Telephone Encounter (Signed)
Per Dr Cyndie Chime, note to [facility] scheduler to add pt to LCT, NP schedule on Wednesday 5/22 at 1345.  dph

## 2011-06-27 NOTE — Telephone Encounter (Signed)
Talked to pt, gave her appt date for 06/29/11 lab and ML

## 2011-06-27 NOTE — Telephone Encounter (Signed)
Received call from pt wishing to be "worked in this week." Pt reports "Dr Cyndie Chime told me to call him when the pain medicine he gives me is not holding me."  Pt denies any new pain & does not give details of pain - only stating "he knows about all my pain."   Pt also reports finding "a new lump under my arm." Pt vague with description repeating "I just need to get in to see Dr Cyndie Chime."    Note to Dr Cyndie Chime. dph

## 2011-06-29 ENCOUNTER — Other Ambulatory Visit: Payer: Self-pay | Admitting: *Deleted

## 2011-06-29 ENCOUNTER — Other Ambulatory Visit (HOSPITAL_BASED_OUTPATIENT_CLINIC_OR_DEPARTMENT_OTHER): Payer: Medicare Other | Admitting: Lab

## 2011-06-29 ENCOUNTER — Ambulatory Visit (HOSPITAL_BASED_OUTPATIENT_CLINIC_OR_DEPARTMENT_OTHER): Payer: Medicare Other | Admitting: Nurse Practitioner

## 2011-06-29 VITALS — BP 101/73 | HR 78 | Temp 97.0°F | Ht 66.0 in | Wt 200.9 lb

## 2011-06-29 DIAGNOSIS — M329 Systemic lupus erythematosus, unspecified: Secondary | ICD-10-CM

## 2011-06-29 DIAGNOSIS — G894 Chronic pain syndrome: Secondary | ICD-10-CM

## 2011-06-29 DIAGNOSIS — C50919 Malignant neoplasm of unspecified site of unspecified female breast: Secondary | ICD-10-CM

## 2011-06-29 DIAGNOSIS — E119 Type 2 diabetes mellitus without complications: Secondary | ICD-10-CM

## 2011-06-29 DIAGNOSIS — R599 Enlarged lymph nodes, unspecified: Secondary | ICD-10-CM

## 2011-06-29 DIAGNOSIS — C787 Secondary malignant neoplasm of liver and intrahepatic bile duct: Secondary | ICD-10-CM

## 2011-06-29 LAB — COMPREHENSIVE METABOLIC PANEL
CO2: 32 mEq/L (ref 19–32)
Creatinine, Ser: 1.09 mg/dL (ref 0.50–1.10)
Glucose, Bld: 92 mg/dL (ref 70–99)
Total Bilirubin: 0.3 mg/dL (ref 0.3–1.2)

## 2011-06-29 LAB — CBC WITH DIFFERENTIAL/PLATELET
Basophils Absolute: 0 10*3/uL (ref 0.0–0.1)
Eosinophils Absolute: 0 10*3/uL (ref 0.0–0.5)
HGB: 9.9 g/dL — ABNORMAL LOW (ref 11.6–15.9)
LYMPH%: 5 % — ABNORMAL LOW (ref 14.0–49.7)
MCH: 22.9 pg — ABNORMAL LOW (ref 25.1–34.0)
MCV: 73.5 fL — ABNORMAL LOW (ref 79.5–101.0)
MONO%: 4.4 % (ref 0.0–14.0)
NEUT#: 12.7 10*3/uL — ABNORMAL HIGH (ref 1.5–6.5)
Platelets: 190 10*3/uL (ref 145–400)
RBC: 4.33 10*6/uL (ref 3.70–5.45)

## 2011-06-29 LAB — LACTATE DEHYDROGENASE: LDH: 153 U/L (ref 94–250)

## 2011-06-29 MED ORDER — HYDROCODONE-ACETAMINOPHEN 10-325 MG PO TABS: ORAL_TABLET | ORAL | Status: DC

## 2011-06-29 NOTE — Progress Notes (Signed)
OFFICE PROGRESS NOTE  Interval history:  [name] Carr is a 56 year old woman with metastatic breast cancer involving the liver. Initial diagnosis dates to August 2011. She had a large lesion in the right breast. Biopsies on 08/27/2009 showed high-grade invasive carcinoma, triple negative. PET scan showed activity over the breast lesion and in addition several small lesions seen in the right lobe of the liver with the largest measuring 1.2 x 0.6 cm. Liver biopsy 09/21/2009 was positive for adenocarcinoma. She received initial chemotherapy with Cytoxan plus Taxotere for 3 cycles with a dramatic response but poor tolerance. She was treated with Abraxane between 12/07/2009 and 04/27/2010 when she elected to stop all treatments due to poor tolerance. She was discharged from the oncology practice in [facility] and first saw Dr. Cyndie Chime on 02/04/2011. She was put on a brief trial of oral Xeloda which was also stopped for poor tolerance. CT scan of the abdomen and pelvis done 06/24/2010 showed a normal-appearing liver without any lesions. Mammograms and MRI of the breast showed residual tumor in the right breast. She elected to have bilateral mastectomies. She had a number postoperative complications with wound infection and infection at the site of a Port-A-Cath infusion device that was placed at the time of the initial surgery. She had multiple hospitalizations related to the infections and additional infections of a PICC catheter placed after the Port-A-Cath was removed. Restaging CT scans of the chest, abdomen and pelvis and a PET scan on 05/20/2011 showed no evidence of active disease. She is being followed on an observation approach.  [name] Carr returns prior to scheduled followup do to concern for a lump in the left axilla as well as poor pain control.  She initially noticed a lump in the left axilla one week ago. The lump has not changed.  She reports poor pain control. She is currently taking 2 Vicodin  tablets 2-3 times a day. The Vicodin is "working" but the effect only lasts 2-3 hours. She has "run out" of her pain medication early. The pain is generalized including "all joints", the back and stomach. In the past she was on a Duragesic patch. She feels that she needs to resume this at a dose of 50 mcg every 3 days. The 25 mcg Duragesic patch is not effective for her. She would also like to increase the Vicodin to 2 tablets 3 times a day.   Objective: Blood pressure 101/73, pulse 78, temperature 97 F (36.1 C), temperature source Oral, height 5\' 6"  (1.676 m), weight 200 lb 14.4 oz (91.128 kg).  Oropharynx is without thrush. Ulcers at the lateral edges of the tongue. No palpable cervical, supraclavicular or right axillary lymph nodes. Question small, less than 1 cm, lymph node in the deep left axilla. Status post bilateral mastectomy. No evidence of chest wall recurrence. Lungs clear. Regular cardiac rhythm. Abdomen is soft. She is tender in the epigastric region. No hepatomegaly. Extremities are without edema.  Lab Results: Lab Results  Component Value Date   WBC 14.2* 06/29/2011   HGB 9.9* 06/29/2011   HCT 31.9* 06/29/2011   MCV 73.5* 06/29/2011   PLT 190 06/29/2011    Chemistry:    Chemistry      Component Value Date/Time   NA 128* 05/20/2011 1143   K 2.7* 05/20/2011 1143   CL 83* 05/20/2011 1143   CO2 32 05/20/2011 1143   BUN 26* 05/20/2011 1143   CREATININE 1.38* 05/20/2011 1143      Component Value Date/Time   CALCIUM  8.8 05/20/2011 1143   ALKPHOS 101 05/20/2011 1143   AST 20 05/20/2011 1143   ALT 13 05/20/2011 1143   BILITOT 0.3 05/20/2011 1143       Studies/Results: No results found.  Medications: I have reviewed the patient's current medications.  Assessment/Plan:  1. 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 Initially treated with Taxotere and Cytoxan for 3 cycles. Subsequent  Abraxane given October 2011 through March 2012. Brief 2 week trial of Xeloda given through our office in September 2012 with discontinuation due to poor GI tolerance. Restaging CT scans of the chest, abdomen and pelvis and a PET scan on 05/20/2011 showed no evidence of active disease. She is currently being followed on an observation approach. 2. Status post bilateral simple mastectomies 11/19/2010. 3. Extensive chest wall cellulitis hospitalized 11/30/2010 through 12/08/2010. She was treated with IV vancomycin and Zosyn. 4. Status post Port-A-Cath placement 11/19/2010. The Port-A-Cath was removed on 12/23/2010 at which time she was hospitalized for confusion and fever. 5. Right shoulder abscess January 2013 status post incision and drainage. 6. SLE. 7. Chronic pain syndrome.  8. Narcotic dependence. 9. Personality disorder. 10. 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 11. Chronic nausea/vomiting. 12. Question small left axillary lymph node. We will continue to monitor.  Disposition-[name] Carr has chronic pain unrelated to her cancer diagnosis. Dr. Cyndie Chime feels the pain in large part is related to lupus and recommends that she establish care with a rheumatologist for chronic management. Dr. Cyndie Chime declined to prescribe a Duragesic patch or other narcotics beyond the hydrocodone we are currently prescribing citing her history of narcotic dependence as well as the non-cancer nature of her pain. We did increase the hydrocodone prescription to 10/325 one to 2 tablets every 8 hours as needed. We offered to refer her to a rheumatologist in [place]. She prefers to establish care with a rheumatologist in [facility] due to transportation issues. We are hopeful the rheumatologist will have some suggestions for non-narcotic pain control. [name] Carr keep her scheduled followup appointment with Dr. Cyndie Chime in August. She will contact the office in the interim  with any problems.  Patient seen with Dr. Cyndie Chime.  Lonna Cobb ANP/GNP-BC

## 2011-07-01 ENCOUNTER — Telehealth: Payer: Self-pay | Admitting: *Deleted

## 2011-07-01 NOTE — Telephone Encounter (Signed)
Pt called & states that Dr. Cyndie Chime asked her to call if she was unable to get an appt with a rheumatologist in [facility].  She states that there aren't that many in [place] & not able to get anything quick.  She preferred Dr. Cyndie Chime to make an emergency referral to someone he would recommend.  She states she will go any time but would prefer afternoon if possible.  Note to Dr. Cyndie Chime.

## 2011-07-06 ENCOUNTER — Other Ambulatory Visit: Payer: Self-pay | Admitting: Internal Medicine

## 2011-07-06 NOTE — Telephone Encounter (Signed)
Faxed script back to [facility]... 07/06/11@1 :31pm/LMB

## 2011-07-08 ENCOUNTER — Other Ambulatory Visit: Payer: Self-pay | Admitting: Internal Medicine

## 2011-07-08 ENCOUNTER — Ambulatory Visit (INDEPENDENT_AMBULATORY_CARE_PROVIDER_SITE_OTHER): Payer: Medicare Other | Admitting: Internal Medicine

## 2011-07-08 ENCOUNTER — Encounter: Payer: Self-pay | Admitting: Internal Medicine

## 2011-07-08 VITALS — BP 120/82 | HR 83 | Temp 98.5°F | Ht 66.0 in

## 2011-07-08 DIAGNOSIS — G2581 Restless legs syndrome: Secondary | ICD-10-CM

## 2011-07-08 DIAGNOSIS — N390 Urinary tract infection, site not specified: Secondary | ICD-10-CM

## 2011-07-08 DIAGNOSIS — G894 Chronic pain syndrome: Secondary | ICD-10-CM

## 2011-07-08 LAB — POCT URINALYSIS DIPSTICK
Bilirubin, UA: NEGATIVE
Glucose, UA: NEGATIVE
Nitrite, UA: NEGATIVE

## 2011-07-08 MED ORDER — FERROUS SULFATE 325 (65 FE) MG PO TABS: 325.0000 mg | ORAL_TABLET | Freq: Every day | ORAL | Status: DC

## 2011-07-08 MED ORDER — CIPROFLOXACIN HCL 500 MG PO TABS: 500.0000 mg | ORAL_TABLET | Freq: Two times a day (BID) | ORAL | Status: AC

## 2011-07-08 MED ORDER — ROPINIROLE HCL 1 MG PO TABS: 1.0000 mg | ORAL_TABLET | Freq: Every day | ORAL | Status: DC

## 2011-07-08 NOTE — Progress Notes (Signed)
Subjective:    Patient ID: [name] Carr, female    DOB: Mar 16, 1955, 56 y.o.   MRN: [id number]  HPI complains of UTI symptoms - onset 2 weeks ago Also increase in RLS symptoms - ache and need to move  also reviewed interval events and chronic medical issues:  R shoulder abscess 02/2011 - s/p ER visit and surgical I&D - new PICC line in RUE and s/p IV antibiotics at direction of ID - no fever -  DVT L IJ 12/26/10- related to PICC complication - on LMWH for same since dx and planned 6 weeks tx, but stopped 01/2011 due to nose bleed from septal perforation (eval by ENT for same, IP and OP) - LUE doppler with no acute clot on follow up imaging  R Breast cancer dx 08/2009 - hx liver mets, resolved s/p chemo (initially Cytoxan + Taxotere, then Abraxane)- Initial biopsy of her breast revealed 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 PET scan revealed liver involvement and biopsy confirmed poorly differentiated adenocarcinoma consistent with metastatic breast. Hx prior Port-A-Cath and PICC lines removed due to infection. Persisting clinical concerns re: possible self manipulation of these lines. Onc ([facility]) rx'd oral Xeloda, but not tolerated med due to nausea.   Chronic pain - previously managed by pain clinic Dr. Nilsa Nutting, dismissed 02/2011 - reports remotely on home self administered narcotic injections and still wishes to resume same due to nausea and vomiting and difficulty keeping pills down - duragesic patches "helps but not enough" - also on cymbalta  SLE - hx pericarditis - on chronic prednisone for same -   follows periodically with rheum at [facility]- no fever, no new rash   Psoriasis - chronic plaque L elbow - uses steroid cream for same  Past Medical History  Diagnosis Date  . PERSONALITY DISORDER   . Chronic pain syndrome     chronic narcotics, dependancy hx - dakwa for pain mgmt  . MIGRAINE HEADACHE   . Lyme disease   . ALLERGIC RHINITIS   .  ANEMIA-IRON DEFICIENCY   . HYPERLIPIDEMIA   . GERD   . DIABETES MELLITUS, TYPE II   . DEPRESSION   . SLE (systemic lupus erythematosus)     [facility] - chronic pred  . Psoriasis   . Addison's disease   . Degenerative joint disease   . DDD (degenerative disc disease)   . Phlebitis after infusion     left ?calf; "S/P phenergan/demerol injection"  . Blood clot in vein 2011; 02/04/11    "right jugular vein;left jugular vein"  . Hypothyroidism   . SEIZURE DISORDER     "lots of seizures; due to lupus"  . Anxiety   . Neuropathy     hands, feet, due to diabetes & back problem  . Surgical wound infection 02/15/2011  . Abscess or cellulitis of chest wall 02/15/2011  . CARCINOMA, BREAST 08/2009 dx    R breast,  mets to liver - resolution s/p chemo, s/p B mastect  . Metastases to the liver     Review of Systems  Constitutional: Positive for fatigue. Negative for fever and unexpected weight change.  Respiratory: Negative for cough and shortness of breath.   Gastrointestinal: Negative for blood in stool.  Genitourinary: Positive for dysuria and frequency.  Musculoskeletal: Positive for extremity weakness.  Neurological: Negative for dizziness.       Objective:   Physical Exam  BP 120/82  Pulse 83  Temp(Src) 98.5 F (36.9 C) (Oral)  Ht 5\' 6"  (1.676 m)  SpO2 96% Wt Readings from Last 3 Encounters:  06/29/11 200 lb 14.4 oz (91.128 kg)  05/31/11 208 lb 4.8 oz (94.484 kg)  05/11/11 199 lb 6.4 oz (90.447 kg)   Constitutional: She is obese; appears well-developed and well-nourished. No distress. Cardiovascular: Normal rate, regular rhythm and normal heart sounds.  No murmur heard. no BLE edema. Pulmonary/Chest: Effort normal and breath sounds normal. No respiratory distress. She has no wheezes.  Psychiatric: She has a normal mood and nonchalant affect. Her behavior is normal. Judgment and thought content normal.   Lab Results  Component Value Date   WBC 14.2* 06/29/2011     HGB 9.9* 06/29/2011   HCT 31.9* 06/29/2011   PLT 190 06/29/2011   GLUCOSE 92 06/29/2011   CHOL 272 08/09/2009   TRIG 109 08/08/2009   HDL 88 08/09/2009   LDLCALC 469 08/09/2009   ALT 11 06/29/2011   AST 11 06/29/2011   NA 136 06/29/2011   K 3.5 06/29/2011   CL 94* 06/29/2011   CREATININE 1.09 06/29/2011   BUN 25* 06/29/2011   CO2 32 06/29/2011   TSH 3.428 02/05/2011   INR 1.02 02/04/2011   HGBA1C 6.1* 12/26/2010       Assessment & Plan:  See problem list. Medications and labs reviewed today.  RLS - on max gabapentin, unable to tolerate iron due to stomach problems - will try Requip - erx done   UTI - LE on Udip -empiric Cipro - erx done

## 2011-07-08 NOTE — Assessment & Plan Note (Signed)
Narcotic dependency - prev followed with pain mgmt for same - dakwa, but dismissed 02/2011 I declined to increase Klonopin dose or refill Duragesic or change vicodin dose as requested today

## 2011-07-08 NOTE — Telephone Encounter (Signed)
Notified pharmacy spoke with Migadala md ok renewal on wed 07/06/11 & was fax back. Per Migadala did not get gave authorization verbal... 07/08/11@3 ;08pm/LMB

## 2011-07-08 NOTE — Patient Instructions (Addendum)
It was good to see you today. Use Cipro antibiotics for UTI 2x/day x 1 week continue to followup with cancer specialists as ongoing Start Requip for restless leg syndrome and try over the counter iron 325 -  Your prescription(s) have been submitted to your pharmacy. Please take as directed and contact our office if you believe you are having problem(s) with the medication(s). Please keep scheduled followup, call sooner if problems.

## 2011-07-14 ENCOUNTER — Telehealth: Payer: Self-pay

## 2011-07-14 NOTE — Telephone Encounter (Signed)
You should have ov to be more certain that the constipation is in fact the cause of abd pain.

## 2011-07-14 NOTE — Telephone Encounter (Signed)
Pt called c/o constipation x 5 days and abdominal pain. Pt would like to use magnesium citrate to produce BM but is requesting advisement. Please advise on this Dr Felicity Coyer pt, thanks!

## 2011-07-14 NOTE — Telephone Encounter (Signed)
Pt informed of MD's advisement. 

## 2011-07-14 NOTE — Telephone Encounter (Signed)
Pt cannot come in for OV because she is having a dental procedure tomorrow, and she is currently taking Vicodin and Tylenol #3 and thinks the Codeine is causing her constipation. She wants to know if the magnesium citrate is okay to take to produce BM because bottle said if having abdominal pain to contact doctor before taking.

## 2011-07-14 NOTE — Telephone Encounter (Signed)
Ok, but miralax is easier on your system (fewer cramps)

## 2011-07-18 ENCOUNTER — Other Ambulatory Visit: Payer: Self-pay

## 2011-07-18 NOTE — Telephone Encounter (Signed)
Called pt and informed her she can pick up prescription 07/20/11.  She verbalizes understanding.

## 2011-07-18 NOTE — Telephone Encounter (Signed)
Received call from pt stating that she was told to call 2-3 days early for her prescription for hydrocodone-acetaminophen, so she is calling now for possible pick up on Wednesday.  Informed her will give note to MD and office will call her back to let her know when she can pick up prescription.

## 2011-07-19 ENCOUNTER — Other Ambulatory Visit: Payer: Self-pay | Admitting: Internal Medicine

## 2011-07-20 NOTE — Telephone Encounter (Signed)
F[facility]ed script [facility] to [facility]... 07/20/11@9 :21am/LMB

## 2011-08-04 ENCOUNTER — Other Ambulatory Visit: Payer: Self-pay

## 2011-08-05 MED ORDER — CARISOPRODOL 350 MG PO TABS: 350.0000 mg | ORAL_TABLET | Freq: Four times a day (QID) | ORAL | Status: DC | PRN

## 2011-08-05 NOTE — Telephone Encounter (Signed)
Rx called into pharmacy, no answer, no VM.

## 2011-08-08 ENCOUNTER — Other Ambulatory Visit: Payer: Self-pay | Admitting: *Deleted

## 2011-08-08 DIAGNOSIS — M329 Systemic lupus erythematosus, unspecified: Secondary | ICD-10-CM

## 2011-08-08 DIAGNOSIS — G894 Chronic pain syndrome: Secondary | ICD-10-CM

## 2011-08-09 ENCOUNTER — Other Ambulatory Visit: Payer: Self-pay | Admitting: *Deleted

## 2011-08-09 NOTE — Telephone Encounter (Signed)
Pt notified that pain script would be ready for pick up tomorrow.

## 2011-08-12 ENCOUNTER — Other Ambulatory Visit: Payer: Self-pay | Admitting: *Deleted

## 2011-08-12 DIAGNOSIS — G894 Chronic pain syndrome: Secondary | ICD-10-CM

## 2011-08-12 DIAGNOSIS — C801 Malignant (primary) neoplasm, unspecified: Secondary | ICD-10-CM

## 2011-08-12 DIAGNOSIS — M329 Systemic lupus erythematosus, unspecified: Secondary | ICD-10-CM

## 2011-08-12 MED ORDER — HYDROCODONE-ACETAMINOPHEN 10-325 MG PO TABS: 1.0000 | ORAL_TABLET | Freq: Four times a day (QID) | ORAL | Status: DC | PRN

## 2011-08-22 ENCOUNTER — Other Ambulatory Visit: Payer: Self-pay | Admitting: *Deleted

## 2011-08-22 MED ORDER — PROMETHAZINE HCL 25 MG PO TABS: 25.0000 mg | ORAL_TABLET | ORAL | Status: DC | PRN

## 2011-08-25 ENCOUNTER — Telehealth (INDEPENDENT_AMBULATORY_CARE_PROVIDER_SITE_OTHER): Payer: Self-pay | Admitting: General Surgery

## 2011-08-25 NOTE — Telephone Encounter (Signed)
Pt calling to ask for appt.  Last seen on 04/29/11, where mention was made to see in 6 weeks, but "nobody called me with appt" and she forgot to follow-up herself, as she sees other MDs as well.  She would like to see BH now, and is available to come in next Tuesday if possible.  Please advise.

## 2011-08-29 ENCOUNTER — Other Ambulatory Visit: Payer: Self-pay | Admitting: *Deleted

## 2011-08-29 NOTE — Telephone Encounter (Signed)
Pt. Called to request to pick up script for pain medicine due 09/10/11....  Left message on AM that it is too early to pick up script.  Patient may call next Wed. 7/31 to request script.

## 2011-08-30 ENCOUNTER — Other Ambulatory Visit: Payer: Self-pay | Admitting: *Deleted

## 2011-08-30 DIAGNOSIS — M329 Systemic lupus erythematosus, unspecified: Secondary | ICD-10-CM

## 2011-08-30 DIAGNOSIS — G894 Chronic pain syndrome: Secondary | ICD-10-CM

## 2011-08-30 MED ORDER — HYDROCODONE-ACETAMINOPHEN 10-325 MG PO TABS: ORAL_TABLET | ORAL | Status: DC

## 2011-08-31 ENCOUNTER — Other Ambulatory Visit: Payer: Self-pay | Admitting: *Deleted

## 2011-08-31 NOTE — Telephone Encounter (Signed)
Pharmacist called regarding refill.  Last script for Norco  was written to cover one month and it was filled 7/3., however the directions where from the previous script and did not take into account the change in directions for When patient saw Lonna Cobb NP on 5/22.  At that time the dose was increased to one to two Q 8hrs which would make the quantity sufficient for only 3 weeks.  Let the pharmacist know that it is apppropriate to have this refilled with the directions given.  Pharmacist Migdali states that patient will have to pay for this refill herself and she understands this.

## 2011-09-02 ENCOUNTER — Encounter (INDEPENDENT_AMBULATORY_CARE_PROVIDER_SITE_OTHER): Payer: Medicare Other | Admitting: General Surgery

## 2011-09-02 ENCOUNTER — Other Ambulatory Visit: Payer: Self-pay | Admitting: Internal Medicine

## 2011-09-02 MED ORDER — CARISOPRODOL 350 MG PO TABS: 350.0000 mg | ORAL_TABLET | Freq: Four times a day (QID) | ORAL | Status: DC | PRN

## 2011-09-02 NOTE — Telephone Encounter (Signed)
Rx faxed to pharmacy  

## 2011-09-02 NOTE — Telephone Encounter (Signed)
Ok to fill 

## 2011-09-02 NOTE — Telephone Encounter (Signed)
Pt calling for a RF on her Tresa Garter (uses generic).  States the pharmacy has senta request for this several times with no response.  Last filled 08/05/11.  Please call pt when/if this has been called in.  Her # is .[phone].

## 2011-09-08 ENCOUNTER — Other Ambulatory Visit: Payer: Self-pay

## 2011-09-08 DIAGNOSIS — C50919 Malignant neoplasm of unspecified site of unspecified female breast: Secondary | ICD-10-CM

## 2011-09-08 MED ORDER — HYDROCODONE-ACETAMINOPHEN 10-325 MG PO TABS: ORAL_TABLET | ORAL | Status: DC

## 2011-09-12 ENCOUNTER — Encounter: Payer: Self-pay | Admitting: Internal Medicine

## 2011-09-12 ENCOUNTER — Ambulatory Visit (INDEPENDENT_AMBULATORY_CARE_PROVIDER_SITE_OTHER): Payer: Medicare Other | Admitting: Internal Medicine

## 2011-09-12 ENCOUNTER — Other Ambulatory Visit (INDEPENDENT_AMBULATORY_CARE_PROVIDER_SITE_OTHER): Payer: Medicare Other

## 2011-09-12 VITALS — BP 112/72 | HR 82 | Temp 98.0°F | Ht 66.0 in | Wt 189.0 lb

## 2011-09-12 DIAGNOSIS — E785 Hyperlipidemia, unspecified: Secondary | ICD-10-CM

## 2011-09-12 DIAGNOSIS — C50919 Malignant neoplasm of unspecified site of unspecified female breast: Secondary | ICD-10-CM

## 2011-09-12 DIAGNOSIS — G894 Chronic pain syndrome: Secondary | ICD-10-CM

## 2011-09-12 DIAGNOSIS — E119 Type 2 diabetes mellitus without complications: Secondary | ICD-10-CM

## 2011-09-12 DIAGNOSIS — M329 Systemic lupus erythematosus, unspecified: Secondary | ICD-10-CM

## 2011-09-12 LAB — BASIC METABOLIC PANEL
BUN: 21 mg/dL (ref 6–23)
CO2: 30 mEq/L (ref 19–32)
Chloride: 95 mEq/L — ABNORMAL LOW (ref 96–112)
Creatinine, Ser: 0.9 mg/dL (ref 0.4–1.2)

## 2011-09-12 LAB — LIPID PANEL
Cholesterol: 220 mg/dL — ABNORMAL HIGH (ref 0–200)
Total CHOL/HDL Ratio: 4
VLDL: 24.2 mg/dL (ref 0.0–40.0)

## 2011-09-12 NOTE — Assessment & Plan Note (Signed)
Interval hx reviewed - no evidence of "active growth" at this time conservative mgmt ongoing given intol to chemo, treatment complications and no active lesions Follow with onc as ongoing

## 2011-09-12 NOTE — Assessment & Plan Note (Signed)
Narcotic dependency - prev followed with pain mgmt for same - dakwa, but dismissed 02/2011 I declined to increase Klonopin dose or refill Duragesic or change vicodin dose as requested today hydrocodone from onc reviewed- pending eval by rheum as above

## 2011-09-12 NOTE — Assessment & Plan Note (Signed)
?  uncontrolled dz contributing to chronic pain onc has referred to new rheum in [place] as pt declines to return to [facility] and feels [place] too far Chronic pred without change

## 2011-09-12 NOTE — Assessment & Plan Note (Signed)
Remotely on simva and crestor for same - stopped when liver mets identified recheck now, consider resumed statin if needed

## 2011-09-12 NOTE — Progress Notes (Signed)
Subjective:    Patient ID: [name] Carr, female    DOB: December 05, 1955, 56 y.o.   MRN: [id number]  HPI  Here for follow up - reviewed interval events and chronic medical issues:  R shoulder abscess 02/2011 - s/p ER visit and surgical I&D - new PICC line in RUE and s/p IV antibiotics at direction of ID - no fever -  DVT L IJ 12/26/10- related to PICC complication - on LMWH for same since dx and planned 6 weeks tx, but stopped 01/2011 due to nose bleed from septal perforation (eval by ENT for same, IP and OP) - LUE doppler with no acute clot on follow up imaging  R Breast cancer dx 08/2009 - hx liver mets, resolved s/p chemo (initially Cytoxan + Taxotere, then Abraxane)- Initial biopsy of her breast revealed 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 PET scan revealed liver involvement and biopsy confirmed poorly differentiated adenocarcinoma consistent with metastatic breast. Hx prior Port-A-Cath and PICC lines removed due to infection. Persisting clinical concerns re: possible self manipulation of these lines. Onc ([facility]) rx'd oral Xeloda, but not tolerated med due to nausea. Conservative mgmt ongoing given apparent quiet dz process  Chronic pain - previously managed by pain clinic Dr. Nilsa Nutting, dismissed 02/2011 - reports remotely on home self administered narcotic injections due to nausea and vomiting and difficulty keeping pills down - duragesic patches "helps but not enough" - also on cymbalta  SLE - hx pericarditis - on chronic prednisone for same -   follows periodically with rheum at [facility]- no fever, no new rash   Psoriasis - chronic plaque L elbow - uses steroid cream for same  Past Medical History  Diagnosis Date  . PERSONALITY DISORDER   . Chronic pain syndrome     chronic narcotics, dependancy hx - dakwa for pain mgmt  . MIGRAINE HEADACHE   . Lyme disease   . ALLERGIC RHINITIS   . ANEMIA-IRON DEFICIENCY   . HYPERLIPIDEMIA   . GERD   .  DIABETES MELLITUS, TYPE II   . DEPRESSION   . SLE (systemic lupus erythematosus)     [facility] - chronic pred  . Psoriasis   . Addison's disease   . Degenerative joint disease   . DDD (degenerative disc disease)   . Phlebitis after infusion     left ?calf; "S/P phenergan/demerol injection"  . Blood clot in vein 2011; 02/04/11    "right jugular vein;left jugular vein"  . Hypothyroidism   . SEIZURE DISORDER     "lots of seizures; due to lupus"  . Anxiety   . Neuropathy     hands, feet, due to diabetes & back problem  . Surgical wound infection 02/15/2011    R shoulder and mastectomy site - related to PICC  . CARCINOMA, BREAST 08/2009 dx    R breast,  mets to liver - resolution s/p chemo, s/p B mastect  . Metastases to the liver     Review of Systems  Constitutional: Positive for fatigue. Negative for fever and unexpected weight change.  Respiratory: Negative for cough and shortness of breath.   Gastrointestinal: Negative for blood in stool.  Genitourinary: Negative for dysuria.  Musculoskeletal: Positive for extremity weakness.  Neurological: Negative for dizziness.       Objective:   Physical Exam  BP 112/72  Pulse 82  Temp 98 F (36.7 C) (Oral)  Ht 5\' 6"  (1.676 m)  Wt 189 lb (85.73 kg)  BMI  30.51 kg/m2  SpO2 94% Wt Readings from Last 3 Encounters:  09/12/11 189 lb (85.73 kg)  06/29/11 200 lb 14.4 oz (91.128 kg)  05/31/11 208 lb 4.8 oz (94.484 kg)   Constitutional: She is overweight; appears well-developed and well-nourished. No distress. HENT: poor dentition, several extractions upper jaw Cardiovascular: Normal rate, regular rhythm and normal heart sounds.  No murmur heard. no BLE edema. Pulmonary/Chest: Effort normal and breath sounds normal. No respiratory distress. She has no wheezes.  Psychiatric: She has a normal mood and nonchalant affect. Her behavior is normal. Judgment and thought content normal.   Lab Results  Component Value Date   WBC  14.2* 06/29/2011   HGB 9.9* 06/29/2011   HCT 31.9* 06/29/2011   PLT 190 06/29/2011   GLUCOSE 92 06/29/2011   CHOL 272 08/09/2009   TRIG 109 08/08/2009   HDL 88 08/09/2009   LDLCALC 161 08/09/2009   ALT 11 06/29/2011   AST 11 06/29/2011   NA 136 06/29/2011   K 3.5 06/29/2011   CL 94* 06/29/2011   CREATININE 1.09 06/29/2011   BUN 25* 06/29/2011   CO2 32 06/29/2011   TSH 3.428 02/05/2011   INR 1.02 02/04/2011   HGBA1C 6.1* 12/26/2010       Assessment & Plan:  See problem list. Medications and labs reviewed today.

## 2011-09-12 NOTE — Patient Instructions (Signed)
It was good to see you today. Test(s) ordered today. Your results will be called to you after review (48-72hours after test completion). If any changes need to be made, you will be notified at that time. Medications reviewed, no changes at this time. continue to followup with cancer specialists + dentist as ongoing and plans for rheumatology as planned Your prescription(s) have been submitted to your pharmacy. Please take as directed and contact our office if you believe you are having problem(s) with the medication(s). Please schedule followup in 4 months, call sooner if problems.

## 2011-09-12 NOTE — Assessment & Plan Note (Signed)
pred induced - diet controlled The patient is asked to make an attempt to improve diet and exercise patterns to aid in medical management of this problem. Lab Results  Component Value Date   HGBA1C 6.1* 12/26/2010

## 2011-09-14 ENCOUNTER — Other Ambulatory Visit: Payer: Self-pay | Admitting: *Deleted

## 2011-09-14 MED ORDER — GABAPENTIN 300 MG PO CAPS: 900.0000 mg | ORAL_CAPSULE | Freq: Three times a day (TID) | ORAL | Status: DC

## 2011-09-19 ENCOUNTER — Other Ambulatory Visit: Payer: Self-pay | Admitting: *Deleted

## 2011-09-19 ENCOUNTER — Telehealth: Payer: Self-pay | Admitting: *Deleted

## 2011-09-19 DIAGNOSIS — R52 Pain, unspecified: Secondary | ICD-10-CM

## 2011-09-19 MED ORDER — HYDROCODONE-ACETAMINOPHEN 10-325 MG PO TABS: ORAL_TABLET | ORAL | Status: DC

## 2011-09-19 MED ORDER — HYDROCODONE-ACETAMINOPHEN 10-325 MG PO TABS: 1.0000 | ORAL_TABLET | Freq: Four times a day (QID) | ORAL | Status: DC | PRN

## 2011-09-19 NOTE — Telephone Encounter (Signed)
Pt notified that pain script will be ready on Wed. 09/21/11.

## 2011-09-19 NOTE — Telephone Encounter (Signed)
Received call from pt requesting re-fill for Hydrocodone and she plans to pick-up 09/21/11.  Prescription completed for MD to sign.

## 2011-09-23 ENCOUNTER — Other Ambulatory Visit: Payer: Self-pay | Admitting: Internal Medicine

## 2011-09-27 ENCOUNTER — Other Ambulatory Visit (HOSPITAL_BASED_OUTPATIENT_CLINIC_OR_DEPARTMENT_OTHER): Payer: Medicare Other | Admitting: Lab

## 2011-09-27 ENCOUNTER — Emergency Department (HOSPITAL_COMMUNITY)
Admission: EM | Admit: 2011-09-27 | Discharge: 2011-09-27 | Disposition: A | Discharge status: Home / Self Care | Payer: Medicare Other | Attending: Emergency Medicine | Admitting: Emergency Medicine

## 2011-09-27 ENCOUNTER — Encounter (HOSPITAL_COMMUNITY): Payer: Self-pay | Admitting: *Deleted

## 2011-09-27 DIAGNOSIS — M1990 Unspecified osteoarthritis, unspecified site: Secondary | ICD-10-CM | POA: Insufficient documentation

## 2011-09-27 DIAGNOSIS — E785 Hyperlipidemia, unspecified: Secondary | ICD-10-CM | POA: Insufficient documentation

## 2011-09-27 DIAGNOSIS — F329 Major depressive disorder, single episode, unspecified: Secondary | ICD-10-CM | POA: Insufficient documentation

## 2011-09-27 DIAGNOSIS — C787 Secondary malignant neoplasm of liver and intrahepatic bile duct: Secondary | ICD-10-CM | POA: Insufficient documentation

## 2011-09-27 DIAGNOSIS — Z8489 Family history of other specified conditions: Secondary | ICD-10-CM | POA: Insufficient documentation

## 2011-09-27 DIAGNOSIS — E1149 Type 2 diabetes mellitus with other diabetic neurological complication: Secondary | ICD-10-CM | POA: Insufficient documentation

## 2011-09-27 DIAGNOSIS — K219 Gastro-esophageal reflux disease without esophagitis: Secondary | ICD-10-CM | POA: Insufficient documentation

## 2011-09-27 DIAGNOSIS — Z888 Allergy status to other drugs, medicaments and biological substances status: Secondary | ICD-10-CM | POA: Insufficient documentation

## 2011-09-27 DIAGNOSIS — IMO0002 Reserved for concepts with insufficient information to code with codable children: Secondary | ICD-10-CM | POA: Insufficient documentation

## 2011-09-27 DIAGNOSIS — E039 Hypothyroidism, unspecified: Secondary | ICD-10-CM | POA: Insufficient documentation

## 2011-09-27 DIAGNOSIS — C50919 Malignant neoplasm of unspecified site of unspecified female breast: Secondary | ICD-10-CM

## 2011-09-27 DIAGNOSIS — Z9010 Acquired absence of unspecified breast and nipple: Secondary | ICD-10-CM | POA: Insufficient documentation

## 2011-09-27 DIAGNOSIS — D510 Vitamin B12 deficiency anemia due to intrinsic factor deficiency: Secondary | ICD-10-CM

## 2011-09-27 DIAGNOSIS — Z881 Allergy status to other antibiotic agents status: Secondary | ICD-10-CM | POA: Insufficient documentation

## 2011-09-27 DIAGNOSIS — G43909 Migraine, unspecified, not intractable, without status migrainosus: Secondary | ICD-10-CM | POA: Insufficient documentation

## 2011-09-27 DIAGNOSIS — D509 Iron deficiency anemia, unspecified: Secondary | ICD-10-CM

## 2011-09-27 DIAGNOSIS — E2749 Other adrenocortical insufficiency: Secondary | ICD-10-CM | POA: Insufficient documentation

## 2011-09-27 DIAGNOSIS — G8929 Other chronic pain: Secondary | ICD-10-CM | POA: Insufficient documentation

## 2011-09-27 DIAGNOSIS — R739 Hyperglycemia, unspecified: Secondary | ICD-10-CM

## 2011-09-27 DIAGNOSIS — Z833 Family history of diabetes mellitus: Secondary | ICD-10-CM | POA: Insufficient documentation

## 2011-09-27 DIAGNOSIS — F3289 Other specified depressive episodes: Secondary | ICD-10-CM | POA: Insufficient documentation

## 2011-09-27 DIAGNOSIS — F411 Generalized anxiety disorder: Secondary | ICD-10-CM | POA: Insufficient documentation

## 2011-09-27 DIAGNOSIS — M329 Systemic lupus erythematosus, unspecified: Secondary | ICD-10-CM | POA: Insufficient documentation

## 2011-09-27 DIAGNOSIS — Z801 Family history of malignant neoplasm of trachea, bronchus and lung: Secondary | ICD-10-CM | POA: Insufficient documentation

## 2011-09-27 DIAGNOSIS — F609 Personality disorder, unspecified: Secondary | ICD-10-CM | POA: Insufficient documentation

## 2011-09-27 DIAGNOSIS — L408 Other psoriasis: Secondary | ICD-10-CM | POA: Insufficient documentation

## 2011-09-27 DIAGNOSIS — E1142 Type 2 diabetes mellitus with diabetic polyneuropathy: Secondary | ICD-10-CM | POA: Insufficient documentation

## 2011-09-27 DIAGNOSIS — Z853 Personal history of malignant neoplasm of breast: Secondary | ICD-10-CM | POA: Insufficient documentation

## 2011-09-27 DIAGNOSIS — D649 Anemia, unspecified: Secondary | ICD-10-CM

## 2011-09-27 LAB — COMPREHENSIVE METABOLIC PANEL
CO2: 29 mEq/L (ref 19–32)
Creatinine, Ser: 0.94 mg/dL (ref 0.50–1.10)
Glucose, Bld: 125 mg/dL — ABNORMAL HIGH (ref 70–99)
Total Bilirubin: 0.5 mg/dL (ref 0.3–1.2)

## 2011-09-27 LAB — CBC & DIFF AND RETIC
BASO%: 0.1 % (ref 0.0–2.0)
HCT: 32 % — ABNORMAL LOW (ref 34.8–46.6)
Immature Retic Fract: 19.1 % — ABNORMAL HIGH (ref 1.60–10.00)
LYMPH%: 4.1 % — ABNORMAL LOW (ref 14.0–49.7)
MCH: 25 pg — ABNORMAL LOW (ref 25.1–34.0)
MCHC: 31.6 g/dL (ref 31.5–36.0)
MONO#: 0.5 10*3/uL (ref 0.1–0.9)
NEUT%: 91.2 % — ABNORMAL HIGH (ref 38.4–76.8)
Platelets: 260 10*3/uL (ref 145–400)
WBC: 10.9 10*3/uL — ABNORMAL HIGH (ref 3.9–10.3)

## 2011-09-27 LAB — CBC WITH DIFFERENTIAL/PLATELET
HCT: 31.5 % — ABNORMAL LOW (ref 36.0–46.0)
Hemoglobin: 9.8 g/dL — ABNORMAL LOW (ref 12.0–15.0)
Lymphs Abs: 0.7 10*3/uL (ref 0.7–4.0)
MCH: 25.1 pg — ABNORMAL LOW (ref 26.0–34.0)
Monocytes Absolute: 0.5 10*3/uL (ref 0.1–1.0)
Monocytes Relative: 4 % (ref 3–12)
Neutro Abs: 9.6 10*3/uL — ABNORMAL HIGH (ref 1.7–7.7)
Neutrophils Relative %: 89 % — ABNORMAL HIGH (ref 43–77)
RBC: 3.9 MIL/uL (ref 3.87–5.11)

## 2011-09-27 LAB — IRON AND TIBC
%SAT: 9 % — ABNORMAL LOW (ref 20–55)
Iron: 34 ug/dL — ABNORMAL LOW (ref 42–145)
UIBC: 351 ug/dL (ref 125–400)

## 2011-09-27 LAB — URINALYSIS, ROUTINE W REFLEX MICROSCOPIC
Glucose, UA: NEGATIVE mg/dL
Hgb urine dipstick: NEGATIVE
Specific Gravity, Urine: 1.011 (ref 1.005–1.030)
Urobilinogen, UA: 0.2 mg/dL (ref 0.0–1.0)
pH: 8 (ref 5.0–8.0)

## 2011-09-27 LAB — BASIC METABOLIC PANEL
BUN: 7 mg/dL (ref 6–23)
Chloride: 95 mEq/L — ABNORMAL LOW (ref 96–112)
Creatinine, Ser: 0.83 mg/dL (ref 0.50–1.10)
Glucose, Bld: 123 mg/dL — ABNORMAL HIGH (ref 70–99)
Potassium: 3.4 mEq/L — ABNORMAL LOW (ref 3.5–5.1)

## 2011-09-27 LAB — FERRITIN: Ferritin: 31 ng/mL (ref 10–291)

## 2011-09-27 MED ORDER — HYDROMORPHONE HCL 2 MG PO TABS: 2.0000 mg | ORAL_TABLET | ORAL | Status: AC | PRN

## 2011-09-27 MED ORDER — HYDROMORPHONE HCL PF 2 MG/ML IJ SOLN
3.0000 mg | Freq: Once | INTRAMUSCULAR | Status: AC
  Administered 2011-09-27: 3 mg via INTRAMUSCULAR
  Filled 2011-09-27: qty 2

## 2011-09-27 MED ORDER — ONDANSETRON 4 MG PO TBDP
4.0000 mg | ORAL_TABLET | Freq: Once | ORAL | Status: AC
  Administered 2011-09-27: 4 mg via ORAL
  Filled 2011-09-27: qty 1

## 2011-09-27 MED ORDER — HYDROMORPHONE HCL PF 2 MG/ML IJ SOLN
3.0000 mg | Freq: Once | INTRAMUSCULAR | Status: AC
  Administered 2011-09-27: 3 mg via INTRAMUSCULAR
  Filled 2011-09-27 (×2): qty 1

## 2011-09-27 NOTE — ED Notes (Signed)
Pt gives self injections for headaches, has swelling and redness to upper arms, c/o rt upper leg discomfort, had fever this morning

## 2011-09-27 NOTE — ED Provider Notes (Cosign Needed)
History     CSN: [id number]  Arrival date & time 09/27/11  1322   First MD Initiated Contact with Patient 09/27/11 1404      Chief Complaint  Patient presents with  . Leg Swelling    bil arms    (Consider location/radiation/quality/duration/timing/severity/associated sxs/prior treatment) HPI Patient has history of stage IV breast cancer that has metastasized to her liver.  She had a bilateral mastectomy.  She states that her labs that scan showed that the lesion in her liver had resolved.  The patient also has lupus, for which she takes steroids daily.  She presents to emergency department complaining of bilateral upper extremity and lower extremity pain and swelling.  The pain and swelling are at sites where she gives herself injections daily.  She says yesterday.  They were warm and erythematous.  The pain persists, but the redness has resolved.  She denies fevers, chills.  She denies chest pain, cough, or shortness of breath.  She denies urinary tract symptoms, or diarrhea.   Past Medical History  Diagnosis Date  . PERSONALITY DISORDER   . Chronic pain syndrome     chronic narcotics, dependancy hx - [facility] for pain mgmt  . MIGRAINE HEADACHE   . Lyme disease   . ALLERGIC RHINITIS   . ANEMIA-IRON DEFICIENCY   . HYPERLIPIDEMIA   . GERD   . DIABETES MELLITUS, TYPE II   . DEPRESSION   . SLE (systemic lupus erythematosus)     [facility] - chronic pred  . Psoriasis   . Addison's disease   . Degenerative joint disease   . DDD (degenerative disc disease)   . Phlebitis after infusion     left ?calf; "S/P phenergan/demerol injection"  . Blood clot in vein 2011; 02/04/11    "right jugular vein;left jugular vein"  . Hypothyroidism   . SEIZURE DISORDER     "lots of seizures; due to lupus"  . Anxiety   . Neuropathy     hands, feet, due to diabetes & back problem  . Surgical wound infection 02/15/2011    R shoulder and mastectomy site - related to PICC  . CARCINOMA,  BREAST 08/2009 dx    R breast,  mets to liver - resolution s/p chemo, s/p B mastect  . Metastases to the liver     Past Surgical History  Procedure Date  . Liver bopsy 09/2009  . Port a cath placement 09/2009; 11/19/10  . Port-a-cath removal 04/2010    "due to staph & strept"  . Port-a-cath removal 12/23/2010    Procedure: REMOVAL PORT-A-CATH;  Surgeon: Valarie Merino, MD;  Location: [facility];  Service: General;  Laterality: Left;  . Breast surgery 09/2009    right breast biopsy  . Mastectomy 11/19/10    bilateral mastectomy   . I&d extremity ~ 2010    right hand; S/P "dog bite"  . Peripherally inserted central catheter insertion     Family History  Problem Relation Age of Onset  . Lung cancer Mother   . Lung cancer Father   . Arthritis Other   . Diabetes Other   . Hyperlipidemia Other     History  Substance Use Topics  . Smoking status: Never Smoker   . Smokeless tobacco: Never Used  . Alcohol Use: No    OB History    Grav Para Term Preterm Abortions TAB SAB Ect Mult Living  Review of Systems  Constitutional: Negative for fever and chills.  HENT: Negative for neck pain.   Eyes: Negative for visual disturbance.  Respiratory: Negative for cough and shortness of breath.   Cardiovascular: Negative for chest pain.  Gastrointestinal: Negative for nausea, vomiting and abdominal pain.  Genitourinary: Negative for dysuria.  Musculoskeletal:       Bilateral arm pain over her deltoid muscles and bilateral upper thigh pain  Skin: Negative for color change and rash.  Neurological: Negative for weakness and headaches.  Hematological: Does not bruise/bleed easily.  Psychiatric/Behavioral: Negative for confusion.  All other systems reviewed and are negative.    Allergies  Methotrexate; Midazolam hcl; Ammonia; Cephalexin; Contrast media; Infliximab; Iohexol; Ketorolac tromethamine; Nalbuphine; Nsaids; Ultram; and Celecoxib  Home Medications   Current  Outpatient Rx  Name Route Sig Dispense Refill  . CARISOPRODOL 350 MG PO TABS Oral Take 350 mg by mouth 4 (four) times daily as needed.    Marland Kitchen CLONAZEPAM 1 MG PO TABS Oral Take 1 mg by mouth 2 (two) times daily as needed.    Marland Kitchen DOCUSATE SODIUM 100 MG PO CAPS Oral Take 200 mg by mouth 2 (two) times daily.     . DULOXETINE HCL 60 MG PO CPEP Oral Take 60 mg by mouth 2 (two) times daily.    Marland Kitchen ESOMEPRAZOLE MAGNESIUM 40 MG PO CPDR Oral Take 40 mg by mouth daily before breakfast.    . FUROSEMIDE 20 MG PO TABS Oral Take 20 mg by mouth 2 (two) times daily.    Marland Kitchen GABAPENTIN 300 MG PO CAPS Oral Take 900 mg by mouth 3 (three) times daily.    Marland Kitchen HYDROCHLOROTHIAZIDE 25 MG PO TABS Oral Take 25 mg by mouth daily.    Marland Kitchen HYDROCODONE-ACETAMINOPHEN 10-325 MG PO TABS Oral Take 1-2 tablets by mouth every 8 (eight) hours as needed. Take 1-2 tabs po Q 8 hours prn pain.  This is a 3 weeks supply Pt should ONLY get narcotics from Dr Cyndie Chime or Lonna Cobb, NP. [facility]) dph    . POTASSIUM CHLORIDE CRYS ER 20 MEQ PO TBCR Oral Take 20 mEq by mouth 2 (two) times daily. Or as directed     . PREDNISONE 10 MG PO TABS Oral Take 10 mg by mouth daily.    Marland Kitchen PROBIOTIC FORMULA PO CAPS Oral Take 1 capsule by mouth daily.     Marland Kitchen PROMETHAZINE HCL 25 MG RE SUPP Rectal Place 25 mg rectally every 4 (four) hours as needed.    Marland Kitchen PROMETHAZINE HCL 25 MG PO TABS Oral Take 25 mg by mouth every 4 (four) hours as needed.    Marland Kitchen ROPINIROLE HCL 1 MG PO TABS Oral Take 1 mg by mouth at bedtime.    . SPIRONOLACTONE-HCTZ 25-25 MG PO TABS Oral Take 1 tablet by mouth 2 (two) times daily.    . TRAZODONE HCL 150 MG PO TABS Oral Take 75-150 mg by mouth at bedtime.     Marland Kitchen EPINEPHRINE 0.3 MG/0.3ML IJ DEVI Intramuscular Inject 0.3 mg into the muscle once as needed. For bee stings.       BP 124/69  Pulse 86  Temp 98.3 F (36.8 C) (Oral)  Resp 18  SpO2 98%  Physical Exam  Nursing note and vitals reviewed. Constitutional: She is oriented to  person, place, and time. She appears well-developed and well-nourished. No distress.  HENT:  Head: Normocephalic and atraumatic.  Eyes: Conjunctivae are normal.  Neck: Normal range of motion. Neck supple.  Cardiovascular: Normal rate.  Exam reveals gallop.   No murmur heard. Pulmonary/Chest: Effort normal and breath sounds normal.  Abdominal: Soft. She exhibits no distension.  Musculoskeletal: Normal range of motion. She exhibits edema and tenderness.       Over both deltoid muscles.  She has mild swelling, with minimal tenderness.  There is no overlying erythema or signs of cellulitis or abscess. Over.  Her proximal anterior thighs.  She has mild swelling and tenderness, with no evidence of infection or abscess  Neurological: She is alert and oriented to person, place, and time. No cranial nerve deficit.  Skin: Skin is warm and dry.  Psychiatric: She has a normal mood and affect. Thought content normal.    ED Course  Procedures (including critical care time)  Labs Reviewed - No data to display No results found.   No diagnosis found.  4:05 PM Improved. Wants to go home.  MDM  Chronic pain Lupus No signs systemic illness or infection.        Cheri Guppy, MD 09/27/11 1606

## 2011-09-29 ENCOUNTER — Other Ambulatory Visit: Payer: Self-pay | Admitting: Internal Medicine

## 2011-10-03 ENCOUNTER — Ambulatory Visit: Payer: Medicare Other | Admitting: Nurse Practitioner

## 2011-10-03 ENCOUNTER — Other Ambulatory Visit: Payer: Self-pay | Admitting: Internal Medicine

## 2011-10-04 ENCOUNTER — Other Ambulatory Visit: Payer: Self-pay | Admitting: Internal Medicine

## 2011-10-04 ENCOUNTER — Other Ambulatory Visit: Payer: Self-pay | Admitting: *Deleted

## 2011-10-04 ENCOUNTER — Ambulatory Visit (HOSPITAL_BASED_OUTPATIENT_CLINIC_OR_DEPARTMENT_OTHER): Payer: Medicare Other | Admitting: Nurse Practitioner

## 2011-10-04 VITALS — BP 121/72 | HR 88 | Temp 98.9°F | Resp 20 | Ht 66.0 in | Wt 183.6 lb

## 2011-10-04 DIAGNOSIS — M329 Systemic lupus erythematosus, unspecified: Secondary | ICD-10-CM

## 2011-10-04 DIAGNOSIS — C50919 Malignant neoplasm of unspecified site of unspecified female breast: Secondary | ICD-10-CM

## 2011-10-04 DIAGNOSIS — G8929 Other chronic pain: Secondary | ICD-10-CM

## 2011-10-04 DIAGNOSIS — M549 Dorsalgia, unspecified: Secondary | ICD-10-CM

## 2011-10-04 DIAGNOSIS — C787 Secondary malignant neoplasm of liver and intrahepatic bile duct: Secondary | ICD-10-CM

## 2011-10-04 MED ORDER — HYDROCODONE-ACETAMINOPHEN 10-325 MG PO TABS: 1.0000 | ORAL_TABLET | Freq: Three times a day (TID) | ORAL | Status: DC | PRN

## 2011-10-04 NOTE — Telephone Encounter (Signed)
Gave pt appt calendar for November 2013 lab and MD

## 2011-10-04 NOTE — Progress Notes (Signed)
OFFICE PROGRESS NOTE  Interval history:  [name] Carr is a 56 year old woman with metastatic breast cancer involving the liver. Initial diagnosis dates to August 2011. She had a large lesion in the right breast. Biopsies on 08/27/2009 showed high-grade invasive carcinoma, triple negative. PET scan showed activity over the breast lesion and in addition several small lesions seen in the right lobe of the liver with the largest measuring 1.2 x 0.6 cm. Liver biopsy 09/21/2009 was positive for adenocarcinoma. She received initial chemotherapy with Cytoxan plus Taxotere for 3 cycles with a dramatic response but poor tolerance. She was treated with Abraxane between 12/07/2009 and 04/27/2010 when she elected to stop all treatments due to poor tolerance. She was discharged from the oncology practice in [facility] and first saw Dr. Cyndie Chime on 02/04/2011. She was put on a brief trial of oral Xeloda which was also stopped for poor tolerance. CT scan of the abdomen and pelvis done 06/24/2010 showed a normal-appearing liver without any lesions. Mammograms and MRI of the breast showed residual tumor in the right breast. She elected to have bilateral mastectomies. She had a number postoperative complications with wound infection and infection at the site of a Port-A-Cath infusion device that was placed at the time of the initial surgery. She had multiple hospitalizations related to the infections and additional infections of a PICC catheter placed after the Port-A-Cath was removed. Restaging CT scans of the chest, abdomen and pelvis and a PET scan on 05/20/2011 showed no evidence of active disease. She is being followed on an observation approach.  She is seen today for scheduled followup. She reports that overall she is doing well. "Lupus pain" is significantly better. Chronic back pain is unchanged. She takes 6 Vicodin tablets a day. She is no longer able to feel a lump in the left axilla. No fever, cough or shortness of  breath.   Objective: Blood pressure 121/72, pulse 88, temperature 98.9 F (37.2 C), temperature source Oral, resp. rate 20, height 5\' 6"  (1.676 m), weight 183 lb 9.6 oz (83.28 kg).  Oropharynx is without thrush or ulceration. No palpable cervical, supraclavicular or axillary lymph nodes. Specifically no palpable adenopathy in the left axilla. Status post bilateral mastectomy. No evidence of chest wall recurrence. Lungs are clear. Regular cardiac rhythm. Abdomen is soft and nontender. No hepatomegaly. Extremities without edema.  Lab Results: Lab Results  Component Value Date   WBC 10.8* 09/27/2011   HGB 9.8* 09/27/2011   HCT 31.5* 09/27/2011   MCV 80.8 09/27/2011   PLT 232 09/27/2011    Chemistry:    Chemistry      Component Value Date/Time   NA 132* 09/27/2011 1505   K 3.4* 09/27/2011 1505   CL 95* 09/27/2011 1505   CO2 27 09/27/2011 1505   BUN 7 09/27/2011 1505   CREATININE 0.83 09/27/2011 1505      Component Value Date/Time   CALCIUM 8.5 09/27/2011 1505   ALKPHOS 76 09/27/2011 1249   AST 14 09/27/2011 1249   ALT 14 09/27/2011 1249   BILITOT 0.5 09/27/2011 1249       Studies/Results: No results found.  Medications: I have reviewed the patient's current medications.  Assessment/Plan:  1. 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 Initially treated with Taxotere and Cytoxan for 3 cycles. Subsequent Abraxane given October 2011 through March 2012. Brief 2 week trial of Xeloda given through our office  in September 2012 with discontinuation due to poor GI tolerance. Restaging CT scans of the chest, abdomen and pelvis and a PET scan on 05/20/2011 showed no evidence of active disease. CA 27-29 in normal range (31) on 09/27/2011.  2. Status post bilateral simple mastectomies 11/19/2010. 3. Extensive chest wall cellulitis hospitalized 11/30/2010 through 12/08/2010. She was treated with IV vancomycin  and Zosyn. 4. Status post Port-A-Cath placement 11/19/2010. The Port-A-Cath was removed on 12/23/2010 at which time she was hospitalized for confusion and fever. 5. Right shoulder abscess January 2013 status post incision and drainage. 6. SLE. 7. Chronic pain syndrome.  8. Narcotic dependence. 9. Personality disorder. 10. 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 11. Chronic nausea/vomiting. 12. Question small left axillary lymph node on exam 06/29/2011. No adenopathy detected on today's exam.  Disposition-[name] Carr appears stable. We will continue to follow on an observation approach. She will return for a followup visit in 3 months. She will contact the office in the interim with any problems.  Lonna Cobb ANP/GNP-BC

## 2011-10-13 ENCOUNTER — Other Ambulatory Visit: Payer: Self-pay | Admitting: Internal Medicine

## 2011-10-13 NOTE — Telephone Encounter (Signed)
Faxed script back to [facility].../lmb 

## 2011-10-15 ENCOUNTER — Other Ambulatory Visit: Payer: Self-pay | Admitting: Internal Medicine

## 2011-10-17 ENCOUNTER — Other Ambulatory Visit: Payer: Self-pay | Admitting: Internal Medicine

## 2011-10-20 ENCOUNTER — Other Ambulatory Visit: Payer: Self-pay | Admitting: *Deleted

## 2011-10-20 MED ORDER — FUROSEMIDE 20 MG PO TABS: 20.0000 mg | ORAL_TABLET | Freq: Two times a day (BID) | ORAL | Status: DC

## 2011-10-20 NOTE — Telephone Encounter (Signed)
R'cd fax from [facility] for refill of Furosemide.  

## 2011-10-21 ENCOUNTER — Encounter (INDEPENDENT_AMBULATORY_CARE_PROVIDER_SITE_OTHER): Payer: Medicare Other | Admitting: General Surgery

## 2011-10-27 ENCOUNTER — Other Ambulatory Visit: Payer: Self-pay | Admitting: *Deleted

## 2011-10-27 NOTE — Telephone Encounter (Signed)
Received call from pt asking for refill on her pain med-vicodin to pick up Monday.  Will notify pt that this will be ready on Monday 10/31/11.

## 2011-10-28 ENCOUNTER — Other Ambulatory Visit: Payer: Self-pay | Admitting: *Deleted

## 2011-10-28 DIAGNOSIS — M549 Dorsalgia, unspecified: Secondary | ICD-10-CM

## 2011-10-28 MED ORDER — HYDROCODONE-ACETAMINOPHEN 10-325 MG PO TABS: 1.0000 | ORAL_TABLET | Freq: Three times a day (TID) | ORAL | Status: DC | PRN

## 2011-11-04 ENCOUNTER — Other Ambulatory Visit: Payer: Self-pay | Admitting: Internal Medicine

## 2011-11-04 MED ORDER — ROPINIROLE HCL 1 MG PO TABS: 1.0000 mg | ORAL_TABLET | Freq: Every day | ORAL | Status: DC

## 2011-11-04 NOTE — Telephone Encounter (Signed)
REQUESTING REFILL ON REQUIP TO [facility]

## 2011-11-08 ENCOUNTER — Other Ambulatory Visit: Payer: Self-pay | Admitting: Internal Medicine

## 2011-11-08 NOTE — Telephone Encounter (Signed)
Last written 10/13/2011 #60 with 0 refills-please advise.

## 2011-11-10 NOTE — Telephone Encounter (Signed)
Rx faxed to [facility]. 

## 2011-11-11 ENCOUNTER — Other Ambulatory Visit: Payer: Self-pay | Admitting: Internal Medicine

## 2011-11-14 ENCOUNTER — Other Ambulatory Visit: Payer: Self-pay | Admitting: Internal Medicine

## 2011-11-16 ENCOUNTER — Other Ambulatory Visit: Payer: Self-pay | Admitting: Internal Medicine

## 2011-11-17 ENCOUNTER — Other Ambulatory Visit: Payer: Self-pay | Admitting: *Deleted

## 2011-11-17 DIAGNOSIS — M549 Dorsalgia, unspecified: Secondary | ICD-10-CM

## 2011-11-17 MED ORDER — HYDROCODONE-ACETAMINOPHEN 10-325 MG PO TABS: 1.0000 | ORAL_TABLET | Freq: Three times a day (TID) | ORAL | Status: DC | PRN

## 2011-11-17 NOTE — Telephone Encounter (Signed)
Pt called today requesting refill on her pain med & states that she would like to p/u tomorrow or mon depending when she can get a ride.  Notified that script will be ready tomorrow but can only get filled on 11/21/11.

## 2011-11-21 ENCOUNTER — Encounter (HOSPITAL_COMMUNITY): Payer: Self-pay

## 2011-11-21 ENCOUNTER — Other Ambulatory Visit: Payer: Self-pay | Admitting: *Deleted

## 2011-11-21 ENCOUNTER — Emergency Department (HOSPITAL_COMMUNITY): Payer: Medicare Other

## 2011-11-21 ENCOUNTER — Emergency Department (HOSPITAL_COMMUNITY)
Admission: EM | Admit: 2011-11-21 | Discharge: 2011-11-21 | Disposition: A | Discharge status: Home / Self Care | Payer: Medicare Other | Attending: Emergency Medicine | Admitting: Emergency Medicine

## 2011-11-21 DIAGNOSIS — E119 Type 2 diabetes mellitus without complications: Secondary | ICD-10-CM | POA: Insufficient documentation

## 2011-11-21 DIAGNOSIS — M542 Cervicalgia: Secondary | ICD-10-CM | POA: Insufficient documentation

## 2011-11-21 DIAGNOSIS — L0390 Cellulitis, unspecified: Secondary | ICD-10-CM

## 2011-11-21 DIAGNOSIS — G8929 Other chronic pain: Secondary | ICD-10-CM

## 2011-11-21 DIAGNOSIS — Z79899 Other long term (current) drug therapy: Secondary | ICD-10-CM | POA: Insufficient documentation

## 2011-11-21 LAB — COMPREHENSIVE METABOLIC PANEL
ALT: 18 U/L (ref 0–35)
Alkaline Phosphatase: 76 U/L (ref 39–117)
CO2: 30 mEq/L (ref 19–32)
Chloride: 92 mEq/L — ABNORMAL LOW (ref 96–112)
GFR calc Af Amer: 78 mL/min — ABNORMAL LOW (ref >90)
Glucose, Bld: 79 mg/dL (ref 70–99)
Potassium: 3.1 mEq/L — ABNORMAL LOW (ref 3.5–5.1)
Sodium: 133 mEq/L — ABNORMAL LOW (ref 135–145)
Total Bilirubin: 0.3 mg/dL (ref 0.3–1.2)
Total Protein: 6.6 g/dL (ref 6.0–8.3)

## 2011-11-21 LAB — CBC WITH DIFFERENTIAL/PLATELET
Basophils Absolute: 0 10*3/uL (ref 0.0–0.1)
Basophils Relative: 0 % (ref 0–1)
Eosinophils Relative: 0 % (ref 0–5)
HCT: 32.6 % — ABNORMAL LOW (ref 36.0–46.0)
MCHC: 31.6 g/dL (ref 30.0–36.0)
Monocytes Absolute: 0.7 10*3/uL (ref 0.1–1.0)
Neutro Abs: 7.8 10*3/uL — ABNORMAL HIGH (ref 1.7–7.7)
RDW: 15.7 % — ABNORMAL HIGH (ref 11.5–15.5)

## 2011-11-21 MED ORDER — SODIUM CHLORIDE 0.9 % IV SOLN
INTRAVENOUS | Status: DC
  Administered 2011-11-21: 20:00:00 via INTRAVENOUS

## 2011-11-21 MED ORDER — HYDROMORPHONE HCL PF 2 MG/ML IJ SOLN
2.0000 mg | Freq: Once | INTRAMUSCULAR | Status: DC
  Filled 2011-11-21: qty 1

## 2011-11-21 MED ORDER — HYDROCODONE-ACETAMINOPHEN 10-325 MG PO TABS: 1.0000 | ORAL_TABLET | Freq: Three times a day (TID) | ORAL | Status: DC | PRN

## 2011-11-21 MED ORDER — HYDROMORPHONE HCL PF 1 MG/ML IJ SOLN: 1.0000 mg | Freq: Once | INTRAMUSCULAR | Status: DC

## 2011-11-21 MED ORDER — CEFAZOLIN SODIUM 1-5 GM-% IV SOLN
1.0000 g | Freq: Once | INTRAVENOUS | Status: AC
  Administered 2011-11-21: 1 g via INTRAVENOUS
  Filled 2011-11-21: qty 50

## 2011-11-21 MED ORDER — SULFAMETHOXAZOLE-TRIMETHOPRIM 800-160 MG PO TABS: 1.0000 | ORAL_TABLET | Freq: Two times a day (BID) | ORAL | Status: DC

## 2011-11-21 MED ORDER — ONDANSETRON HCL 4 MG/2ML IJ SOLN: 4.0000 mg | Freq: Once | INTRAMUSCULAR | Status: DC

## 2011-11-21 MED ORDER — HYDROMORPHONE HCL 2 MG PO TABS: 2.0000 mg | ORAL_TABLET | ORAL | Status: DC | PRN

## 2011-11-21 MED ORDER — HYDROMORPHONE HCL PF 1 MG/ML IJ SOLN
1.0000 mg | Freq: Once | INTRAMUSCULAR | Status: AC
  Administered 2011-11-21: 1 mg via INTRAVENOUS
  Filled 2011-11-21: qty 1

## 2011-11-21 MED ORDER — HYDROMORPHONE HCL PF 2 MG/ML IJ SOLN
2.0000 mg | Freq: Once | INTRAMUSCULAR | Status: AC
  Administered 2011-11-21: 2 mg via INTRAMUSCULAR
  Filled 2011-11-21: qty 1

## 2011-11-21 NOTE — ED Notes (Signed)
Pt fell 1 week ago with neck pain started 4 days ago, muscle spasm occur when head is bend backward. Also complaint of bilateral arm cellulitis worsen this morning

## 2011-11-21 NOTE — ED Notes (Signed)
Attempted IV, unsuccessfully.  Isaias Cowman, RN will attempt IV before calling IV team.

## 2011-11-21 NOTE — ED Notes (Signed)
Collected labs but unable to get PT/INR.  IV has been ordered, RN will try to get lab.  Pt aware that i will come back if RN is unable to obtain it.

## 2011-11-21 NOTE — ED Provider Notes (Signed)
History     CSN: [id number]  Arrival date & time 11/21/11  1619   First MD Initiated Contact with Patient 11/21/11 1737      Chief Complaint  Patient presents with  . Neck Pain    (Consider location/radiation/quality/duration/timing/severity/associated sxs/prior treatment) The history is provided by the patient and a friend. No language interpreter was used.  multiple complaints from this 56 yo female today including cellulitis of bilateral upper extremitites and neck pain from a fall > 1 week ago.  She is on Chronic pain meds at home.  Multiple medical conditions listed below.  No weakness or fever.  Neuro in tact.  States that she has been taking keflex that was given to her by a "retired doctor" friend of hers.  States the cellulitis got better but now it is worse.  Port a cath in the past after dx with breast cancer with liver mets. Patient is diabetic.  pmh listed below.   Past Medical History  Diagnosis Date  . PERSONALITY DISORDER   . Chronic pain syndrome     chronic narcotics, dependancy hx - [place] for pain mgmt  . MIGRAINE HEADACHE   . Lyme disease   . ALLERGIC RHINITIS   . ANEMIA-IRON DEFICIENCY   . HYPERLIPIDEMIA   . GERD   . DIABETES MELLITUS, TYPE II   . DEPRESSION   . SLE (systemic lupus erythematosus)     [place] - chronic pred  . Psoriasis   . Addison's disease   . Degenerative joint disease   . DDD (degenerative disc disease)   . Phlebitis after infusion     left ?calf; "S/P phenergan/demerol injection"  . Blood clot in vein 2011; 02/04/11    "right jugular vein;left jugular vein"  . Hypothyroidism   . SEIZURE DISORDER     "lots of seizures; due to lupus"  . Anxiety   . Neuropathy     hands, feet, due to diabetes & back problem  . Surgical wound infection 02/15/2011    R shoulder and mastectomy site - related to PICC  . CARCINOMA, BREAST 08/2009 dx    R breast,  mets to liver - resolution s/p chemo, s/p B mastect  . Metastases to the  liver     Past Surgical History  Procedure Date  . Liver bopsy 09/2009  . Port a cath placement 09/2009; 11/19/10  . Port-a-cath removal 04/2010    "due to staph & strept"  . Port-a-cath removal 12/23/2010    Procedure: REMOVAL PORT-A-CATH;  Surgeon: Valarie Merino, MD;  Location: [facility];  Service: General;  Laterality: Left;  . Breast surgery 09/2009    right breast biopsy  . Mastectomy 11/19/10    bilateral mastectomy   . I&d extremity ~ 2010    right hand; S/P "dog bite"  . Peripherally inserted central catheter insertion     Family History  Problem Relation Age of Onset  . Lung cancer Mother   . Lung cancer Father   . Arthritis Other   . Diabetes Other   . Hyperlipidemia Other     History  Substance Use Topics  . Smoking status: Never Smoker   . Smokeless tobacco: Never Used  . Alcohol Use: No    OB History    Grav Para Term Preterm Abortions TAB SAB Ect Mult Living                  Review of Systems  Constitutional: Negative.  Negative  for fever.  HENT: Positive for neck pain.   Eyes: Negative.   Respiratory: Negative.   Cardiovascular: Negative.  Negative for chest pain and leg swelling.  Gastrointestinal: Negative.  Negative for nausea and vomiting.  Musculoskeletal: Positive for gait problem.       Neck pain with hyperextension  Skin:       Cellulitis to UE  Neurological: Positive for headaches. Negative for dizziness, speech difficulty, weakness and light-headedness.  Psychiatric/Behavioral: The patient is nervous/anxious.   All other systems reviewed and are negative.    Allergies  Methotrexate; Midazolam hcl; Ammonia; Cephalexin; Contrast media; Infliximab; Iohexol; Ketorolac tromethamine; Nalbuphine; Nsaids; Ultram; and Celecoxib  Home Medications   Current Outpatient Rx  Name Route Sig Dispense Refill  . CARISOPRODOL 350 MG PO TABS Oral Take 350 mg by mouth 4 (four) times daily as needed. Muscle pain    . CLONAZEPAM 1 MG PO TABS  TAKE 1  TABLET BY MOUTH TWICE DAILY AS NEEDED 60 tablet 0  . DOCUSATE SODIUM 100 MG PO CAPS Oral Take 200 mg by mouth 2 (two) times daily.     . DULOXETINE HCL 60 MG PO CPEP Oral Take 60 mg by mouth 2 (two) times daily.    Marland Kitchen EPINEPHRINE 0.3 MG/0.3ML IJ DEVI Intramuscular Inject 0.3 mg into the muscle once as needed. For bee stings.     . ESOMEPRAZOLE MAGNESIUM 40 MG PO CPDR Oral Take 40 mg by mouth daily before breakfast.    . FUROSEMIDE 20 MG PO TABS Oral Take 1 tablet (20 mg total) by mouth 2 (two) times daily. 60 tablet 5  . GABAPENTIN 300 MG PO CAPS Oral Take 900 mg by mouth 3 (three) times daily.    Marland Kitchen HYDROCHLOROTHIAZIDE 25 MG PO TABS Oral Take 25 mg by mouth daily.    Marland Kitchen HYDROCODONE-ACETAMINOPHEN 10-325 MG PO TABS Oral Take 1-2 tablets by mouth every 8 (eight) hours as needed for pain. DO NOT FILL NEXT PRESCRIPTION UNTIL 12/12/11 Take 1-2 tabs po Q 8 hours prn pain.  This is a 3 week supply Pt should ONLY get narcotics from Dr Cyndie Chime or Lonna Cobb, NP. [facility]) 126 tablet 0  . POTASSIUM CHLORIDE CRYS ER 20 MEQ PO TBCR Oral Take 20 mEq by mouth 2 (two) times daily. Or as directed     . PREDNISONE 10 MG PO TABS Oral Take 10 mg by mouth daily.    Marland Kitchen PROBIOTIC FORMULA PO CAPS Oral Take 1 capsule by mouth daily.     Marland Kitchen PROMETHAZINE HCL 25 MG RE SUPP Rectal Place 25 mg rectally every 4 (four) hours as needed.    Marland Kitchen PROMETHAZINE HCL 25 MG PO TABS  TAKE 1 TABLET BY MOUTH EVERY 4 HOURS AS NEEDED FOR NAUSEA 30 tablet 0  . ROPINIROLE HCL 1 MG PO TABS Oral Take 1 tablet (1 mg total) by mouth at bedtime. 30 tablet 2  . SPIRONOLACTONE-HCTZ 25-25 MG PO TABS Oral Take 1 tablet by mouth 2 (two) times daily.    . TRAZODONE HCL 150 MG PO TABS Oral Take 75-150 mg by mouth at bedtime.     Marland Kitchen HYDROMORPHONE HCL 2 MG PO TABS Oral Take 1 tablet (2 mg total) by mouth every 4 (four) hours as needed for pain. 8 tablet 0  . SULFAMETHOXAZOLE-TRIMETHOPRIM 800-160 MG PO TABS Oral Take 1 tablet by mouth every 12  (twelve) hours. 10 tablet 0    BP 119/69  Pulse 76  Temp 98.6 F (37  C) (Oral)  Resp 16  SpO2 98%  Physical Exam  Nursing note and vitals reviewed. Constitutional: She is oriented to person, place, and time. She appears well-developed and well-nourished.  HENT:  Head: Normocephalic and atraumatic.  Eyes: Conjunctivae normal and EOM are normal. Pupils are equal, round, and reactive to light.  Neck: Normal range of motion. Neck supple.  Cardiovascular: Normal rate.   Pulmonary/Chest: Effort normal.  Abdominal: Soft.  Musculoskeletal: Normal range of motion. She exhibits no edema and no tenderness.       Neck tenderness.  No UE weakness + fine motor movement to the hands  Neurological: She is alert and oriented to person, place, and time. She has normal reflexes.  Skin: Skin is warm and dry. There is erythema.       Posterior upper extremities slightly redened.  Warm to touch  Psychiatric: She has a normal mood and affect.    ED Course  Procedures (including critical care time)  Labs Reviewed  COMPREHENSIVE METABOLIC PANEL - Abnormal; Notable for the following:    Sodium 133 (*)     Potassium 3.1 (*)     Chloride 92 (*)     GFR calc non Af Amer 67 (*)     GFR calc Af Amer 78 (*)     All other components within normal limits  CBC WITH DIFFERENTIAL - Abnormal; Notable for the following:    Hemoglobin 10.3 (*)     HCT 32.6 (*)     MCH 25.5 (*)     RDW 15.7 (*)     Neutrophils Relative 82 (*)     Neutro Abs 7.8 (*)     Lymphocytes Relative 11 (*)     All other components within normal limits  PROTIME-INR   Ct Cervical Spine Wo Contrast  11/21/2011  *RADIOLOGY REPORT*  Clinical Data: Fall with pain.  CT CERVICAL SPINE WITHOUT CONTRAST  Technique:  Multidetector CT imaging of the cervical spine was performed. Multiplanar CT image reconstructions were also generated.  Comparison: P E T of 05/20/2011.  Prior cervical spine CT of 03/19/2011.  Findings: Spinal visualization  through mid T1 level.  From C6 inferiorly are partially degraded by overlying soft tissues/patient body habitus. Prevertebral soft tissues are within normal limits. No apical pneumothorax.  Skull base intact.  No acute fracture or subluxation.  There is straightening expected cervical lordosis.  Facet arthropathy at C2-C4 on the right and C2- C5 on the left. Coronal reformats demonstrate a normal C1-C2 articulation.  .  IMPRESSION: 1. No acute fracture or subluxation. 2. Straightening of expected cervical lordosis could be positional, due to muscular spasm, or ligamentous injury. 3.  Mildly degraded evaluation from C6 inferiorly.   Original Report Authenticated By: Consuello Bossier, M.D.      1. Neck pain   2. Cellulitis       MDM  Subjective cellulitis with pmh of the same.  Recent keflex rx.  No wbc count, no fever.  Ancef 1 gm in the ER.  Rx for bactrim.  Will follow up for recheck in 24 - 48 hours.  Neck pain after fall.  CT cervical spine shows no acute process reviewed by myself.    Will follow up with ortho for possible ligament injury.  Soft collar and rx for a few dilaudid.  Labs unremarkable .  K 3.1.  Patient is on kcl supplement at home.     Labs Reviewed  COMPREHENSIVE METABOLIC PANEL - Abnormal; Notable for  the following:    Sodium 133 (*)     Potassium 3.1 (*)     Chloride 92 (*)     GFR calc non Af Amer 67 (*)     GFR calc Af Amer 78 (*)     All other components within normal limits  CBC WITH DIFFERENTIAL - Abnormal; Notable for the following:    Hemoglobin 10.3 (*)     HCT 32.6 (*)     MCH 25.5 (*)     RDW 15.7 (*)     Neutrophils Relative 82 (*)     Neutro Abs 7.8 (*)     Lymphocytes Relative 11 (*)     All other components within normal limits  LAB REPORT - SCANNED          Remi Haggard, NP 11/23/11 1253

## 2011-11-21 NOTE — ED Provider Notes (Signed)
MSE was initiated and I personally evaluated the patient and placed orders (if any) at  6:30 PM on November 21, 2011.  Pt w hx of bilateral breast CA w mets to liver & lupus (on daily prednisone 10 mg) to ER c/o neck pain s/p fall 2 weeks ago, arm cellulitis, night sweats and chills. Labs and imaging ordered.   The patient appears stable so that the remainder of the MSE may be completed by another provider.  Jaci Carrel, [place] 11/21/11 [phone]

## 2011-11-21 NOTE — ED Notes (Signed)
Pt sts she has finished taking her keflex antibiotic that was prescribed by her "friend doctor"

## 2011-11-22 NOTE — ED Provider Notes (Signed)
Medical screening examination/treatment/procedure(s) were performed by non-physician practitioner and as supervising physician I was immediately available for consultation/collaboration.  Lon Klippel, MD 11/22/11 0134 

## 2011-11-23 ENCOUNTER — Other Ambulatory Visit: Payer: Self-pay | Admitting: Internal Medicine

## 2011-11-23 ENCOUNTER — Other Ambulatory Visit: Payer: Self-pay

## 2011-11-23 MED ORDER — DULOXETINE HCL 60 MG PO CPEP: 60.0000 mg | ORAL_CAPSULE | Freq: Two times a day (BID) | ORAL | Status: DC

## 2011-11-23 MED ORDER — SPIRONOLACTONE-HCTZ 25-25 MG PO TABS: 1.0000 | ORAL_TABLET | Freq: Two times a day (BID) | ORAL | Status: DC

## 2011-11-23 MED ORDER — TRAZODONE HCL 150 MG PO TABS: 75.0000 mg | ORAL_TABLET | Freq: Every day | ORAL | Status: DC

## 2011-11-23 NOTE — ED Provider Notes (Signed)
Medical screening examination/treatment/procedure(s) were performed by non-physician practitioner and as supervising physician I was immediately available for consultation/collaboration.   Maizey Menendez, MD 11/23/11 1504 

## 2011-12-09 ENCOUNTER — Other Ambulatory Visit: Payer: Self-pay | Admitting: Internal Medicine

## 2011-12-09 ENCOUNTER — Other Ambulatory Visit: Payer: Self-pay | Admitting: *Deleted

## 2011-12-09 DIAGNOSIS — M549 Dorsalgia, unspecified: Secondary | ICD-10-CM

## 2011-12-09 MED ORDER — HYDROCODONE-ACETAMINOPHEN 10-325 MG PO TABS: 1.0000 | ORAL_TABLET | Freq: Three times a day (TID) | ORAL | Status: DC | PRN

## 2011-12-09 NOTE — Telephone Encounter (Signed)
Rx faxed to [facility]. 

## 2011-12-09 NOTE — Telephone Encounter (Signed)
Last written 11/08/2011 #60 with 0 refills-please advise.

## 2011-12-09 NOTE — Telephone Encounter (Signed)
Pt called yesterday requesting refill on her pain med & knows she can't fill until 12/12/11 but may not be able to get ride on Mon & may need to p/u script on Fri.  Script will be ready on Fri to refill on Mon 12/12/11.

## 2011-12-10 ENCOUNTER — Other Ambulatory Visit: Payer: Self-pay | Admitting: Internal Medicine

## 2011-12-13 ENCOUNTER — Other Ambulatory Visit: Payer: Self-pay | Admitting: *Deleted

## 2011-12-13 NOTE — Telephone Encounter (Signed)
Received call from pt stating that she has been in the hospital with cellulitis arms,nose,& face since fri or sat & was unable to get here to p/u her pain script.  She reports that she has home health coming in for a 2 hour infusion @ 1:30pm & can't get here herself to p/u script & wants to know if her friend, Frann Rider can p/u for her.  Discussed with inj nurse, Liborio Nixon & she is OK with this but will need a release of information signed for the future.

## 2011-12-15 ENCOUNTER — Other Ambulatory Visit: Payer: Self-pay | Admitting: Internal Medicine

## 2011-12-16 ENCOUNTER — Encounter (INDEPENDENT_AMBULATORY_CARE_PROVIDER_SITE_OTHER): Payer: Medicare Other | Admitting: General Surgery

## 2011-12-16 ENCOUNTER — Observation Stay (HOSPITAL_COMMUNITY)
Admission: EM | Admit: 2011-12-16 | Discharge: 2011-12-18 | Disposition: A | Discharge status: Home / Self Care | Payer: Medicare Other | Attending: Internal Medicine | Admitting: Internal Medicine

## 2011-12-16 ENCOUNTER — Emergency Department (HOSPITAL_COMMUNITY): Payer: Medicare Other

## 2011-12-16 ENCOUNTER — Encounter (HOSPITAL_COMMUNITY): Payer: Self-pay | Admitting: Emergency Medicine

## 2011-12-16 ENCOUNTER — Ambulatory Visit: Payer: Medicare Other | Admitting: Oncology

## 2011-12-16 DIAGNOSIS — D509 Iron deficiency anemia, unspecified: Secondary | ICD-10-CM

## 2011-12-16 DIAGNOSIS — J309 Allergic rhinitis, unspecified: Secondary | ICD-10-CM

## 2011-12-16 DIAGNOSIS — Z853 Personal history of malignant neoplasm of breast: Secondary | ICD-10-CM | POA: Insufficient documentation

## 2011-12-16 DIAGNOSIS — F609 Personality disorder, unspecified: Secondary | ICD-10-CM

## 2011-12-16 DIAGNOSIS — R569 Unspecified convulsions: Secondary | ICD-10-CM

## 2011-12-16 DIAGNOSIS — E871 Hypo-osmolality and hyponatremia: Principal | ICD-10-CM | POA: Insufficient documentation

## 2011-12-16 DIAGNOSIS — E119 Type 2 diabetes mellitus without complications: Secondary | ICD-10-CM | POA: Diagnosis present

## 2011-12-16 DIAGNOSIS — E785 Hyperlipidemia, unspecified: Secondary | ICD-10-CM | POA: Insufficient documentation

## 2011-12-16 DIAGNOSIS — L0390 Cellulitis, unspecified: Secondary | ICD-10-CM

## 2011-12-16 DIAGNOSIS — K121 Other forms of stomatitis: Secondary | ICD-10-CM

## 2011-12-16 DIAGNOSIS — F329 Major depressive disorder, single episode, unspecified: Secondary | ICD-10-CM

## 2011-12-16 DIAGNOSIS — D649 Anemia, unspecified: Secondary | ICD-10-CM | POA: Insufficient documentation

## 2011-12-16 DIAGNOSIS — M329 Systemic lupus erythematosus, unspecified: Secondary | ICD-10-CM | POA: Insufficient documentation

## 2011-12-16 DIAGNOSIS — I82C19 Acute embolism and thrombosis of unspecified internal jugular vein: Secondary | ICD-10-CM

## 2011-12-16 DIAGNOSIS — Z79899 Other long term (current) drug therapy: Secondary | ICD-10-CM | POA: Insufficient documentation

## 2011-12-16 DIAGNOSIS — G8929 Other chronic pain: Secondary | ICD-10-CM

## 2011-12-16 DIAGNOSIS — R300 Dysuria: Secondary | ICD-10-CM | POA: Insufficient documentation

## 2011-12-16 DIAGNOSIS — R0789 Other chest pain: Secondary | ICD-10-CM | POA: Insufficient documentation

## 2011-12-16 DIAGNOSIS — K219 Gastro-esophageal reflux disease without esophagitis: Secondary | ICD-10-CM

## 2011-12-16 DIAGNOSIS — R059 Cough, unspecified: Secondary | ICD-10-CM | POA: Insufficient documentation

## 2011-12-16 DIAGNOSIS — E876 Hypokalemia: Secondary | ICD-10-CM | POA: Insufficient documentation

## 2011-12-16 DIAGNOSIS — R05 Cough: Secondary | ICD-10-CM | POA: Insufficient documentation

## 2011-12-16 DIAGNOSIS — L02419 Cutaneous abscess of limb, unspecified: Secondary | ICD-10-CM

## 2011-12-16 DIAGNOSIS — IMO0002 Reserved for concepts with insufficient information to code with codable children: Secondary | ICD-10-CM | POA: Insufficient documentation

## 2011-12-16 DIAGNOSIS — G43909 Migraine, unspecified, not intractable, without status migrainosus: Secondary | ICD-10-CM | POA: Insufficient documentation

## 2011-12-16 DIAGNOSIS — G894 Chronic pain syndrome: Secondary | ICD-10-CM

## 2011-12-16 DIAGNOSIS — R51 Headache: Secondary | ICD-10-CM

## 2011-12-16 DIAGNOSIS — N259 Disorder resulting from impaired renal tubular function, unspecified: Secondary | ICD-10-CM

## 2011-12-16 DIAGNOSIS — T8149XA Infection following a procedure, other surgical site, initial encounter: Secondary | ICD-10-CM

## 2011-12-16 DIAGNOSIS — C50919 Malignant neoplasm of unspecified site of unspecified female breast: Secondary | ICD-10-CM | POA: Diagnosis present

## 2011-12-16 LAB — BASIC METABOLIC PANEL
BUN: 23 mg/dL (ref 6–23)
Chloride: 86 mEq/L — ABNORMAL LOW (ref 96–112)
Creatinine, Ser: 1.11 mg/dL — ABNORMAL HIGH (ref 0.50–1.10)
GFR calc Af Amer: 63 mL/min — ABNORMAL LOW (ref >90)
GFR calc non Af Amer: 54 mL/min — ABNORMAL LOW (ref >90)

## 2011-12-16 LAB — CBC WITH DIFFERENTIAL/PLATELET
Basophils Absolute: 0 10*3/uL (ref 0.0–0.1)
Basophils Relative: 1 % (ref 0–1)
Eosinophils Absolute: 0 10*3/uL (ref 0.0–0.7)
HCT: 29.2 % — ABNORMAL LOW (ref 36.0–46.0)
MCH: 25.6 pg — ABNORMAL LOW (ref 26.0–34.0)
MCHC: 32.2 g/dL (ref 30.0–36.0)
Monocytes Absolute: 0.7 10*3/uL (ref 0.1–1.0)
Neutro Abs: 4.5 10*3/uL (ref 1.7–7.7)
RDW: 15.4 % (ref 11.5–15.5)

## 2011-12-16 LAB — URINALYSIS, ROUTINE W REFLEX MICROSCOPIC
Bilirubin Urine: NEGATIVE
Ketones, ur: NEGATIVE mg/dL
Nitrite: NEGATIVE
Protein, ur: NEGATIVE mg/dL
Specific Gravity, Urine: 1.013 (ref 1.005–1.030)
Urobilinogen, UA: 0.2 mg/dL (ref 0.0–1.0)

## 2011-12-16 MED ORDER — PROMETHAZINE HCL 25 MG/ML IJ SOLN
25.0000 mg | Freq: Once | INTRAMUSCULAR | Status: AC
  Administered 2011-12-16: 25 mg via INTRAVENOUS
  Filled 2011-12-16: qty 1

## 2011-12-16 MED ORDER — SODIUM CHLORIDE 0.9 % IV SOLN
INTRAVENOUS | Status: DC
  Administered 2011-12-16: via INTRAVENOUS

## 2011-12-16 MED ORDER — ONDANSETRON HCL 4 MG/2ML IJ SOLN
4.0000 mg | Freq: Once | INTRAMUSCULAR | Status: AC
  Administered 2011-12-16: 4 mg via INTRAVENOUS
  Filled 2011-12-16: qty 2

## 2011-12-16 MED ORDER — HYDROMORPHONE HCL PF 2 MG/ML IJ SOLN
3.0000 mg | Freq: Once | INTRAMUSCULAR | Status: AC
  Administered 2011-12-16: 3 mg via INTRAMUSCULAR
  Filled 2011-12-16: qty 1

## 2011-12-16 MED ORDER — HYDROMORPHONE HCL PF 1 MG/ML IJ SOLN
1.0000 mg | Freq: Once | INTRAMUSCULAR | Status: DC
  Filled 2011-12-16: qty 1

## 2011-12-16 MED ORDER — HYDROMORPHONE HCL PF 1 MG/ML IJ SOLN
1.0000 mg | Freq: Once | INTRAMUSCULAR | Status: AC
  Administered 2011-12-16: 1 mg via INTRAVENOUS
  Filled 2011-12-16: qty 1

## 2011-12-16 NOTE — ED Notes (Signed)
Pt alert, arrives from home, c/o migraine h/a, onset was a few days ago, states "hurts to touch my head", resp even unlabored, skin pwd, pt has indwelling PICC to right arm, currently treated for home cellulitis of the face

## 2011-12-16 NOTE — ED Provider Notes (Signed)
History     CSN: [id number]  Arrival date & time 12/16/11  2022   First MD Initiated Contact with Patient 12/16/11 2124      Chief Complaint  Patient presents with  . Headache    (Consider location/radiation/quality/duration/timing/severity/associated sxs/prior treatment) Patient is a 56 y.o. female presenting with headaches. The history is provided by the patient.  Headache  Pertinent negatives include no fever, no shortness of breath and no vomiting.   56 year old, female, with a history of anesthetic.  Breast cancer to her liver, bilateral mastectomy, and lupus, presents to emergency department complaining of headache for the past few, days, along with a cough with green sputum, and dysuria.  She states that her last that scan was in April and was negative.  She has had nausea, with no vomiting.  She denies blurred vision.  She denies weakness, or dizziness.  She denies fevers, chills, or shortness of breath.  She states that she has migraine headaches, but this feels different.  Past Medical History  Diagnosis Date  . PERSONALITY DISORDER   . Chronic pain syndrome     chronic narcotics, dependancy hx - [facility] for pain mgmt  . MIGRAINE HEADACHE   . Lyme disease   . ALLERGIC RHINITIS   . ANEMIA-IRON DEFICIENCY   . HYPERLIPIDEMIA   . GERD   . DIABETES MELLITUS, TYPE II   . DEPRESSION   . SLE (systemic lupus erythematosus)     [facility] - chronic pred  . Psoriasis   . Addison's disease   . Degenerative joint disease   . DDD (degenerative disc disease)   . Phlebitis after infusion     left ?calf; "S/P phenergan/demerol injection"  . Blood clot in vein 2011; 02/04/11    "right jugular vein;left jugular vein"  . Hypothyroidism   . SEIZURE DISORDER     "lots of seizures; due to lupus"  . Anxiety   . Neuropathy     hands, feet, due to diabetes & back problem  . Surgical wound infection 02/15/2011    R shoulder and mastectomy site - related to PICC  . CARCINOMA,  BREAST 08/2009 dx    R breast,  mets to liver - resolution s/p chemo, s/p B mastect  . Metastases to the liver     Past Surgical History  Procedure Date  . Liver bopsy 09/2009  . Port a cath placement 09/2009; 11/19/10  . Port-a-cath removal 04/2010    "due to staph & strept"  . Port-a-cath removal 12/23/2010    Procedure: REMOVAL PORT-A-CATH;  Surgeon: Valarie Merino, MD;  Location: [facility];  Service: General;  Laterality: Left;  . Breast surgery 09/2009    right breast biopsy  . Mastectomy 11/19/10    bilateral mastectomy   . I&d extremity ~ 2010    right hand; S/P "dog bite"  . Peripherally inserted central catheter insertion     Family History  Problem Relation Age of Onset  . Lung cancer Mother   . Lung cancer Father   . Arthritis Other   . Diabetes Other   . Hyperlipidemia Other     History  Substance Use Topics  . Smoking status: Never Smoker   . Smokeless tobacco: Never Used  . Alcohol Use: No    OB History    Grav Para Term Preterm Abortions TAB SAB Ect Mult Living                  Review of  Systems  Constitutional: Negative for fever, chills and diaphoresis.  HENT: Negative for neck pain.   Eyes: Negative for visual disturbance.  Respiratory: Positive for cough. Negative for chest tightness and shortness of breath.   Cardiovascular: Negative for chest pain and leg swelling.  Gastrointestinal: Negative for vomiting, abdominal pain and diarrhea.  Genitourinary: Positive for dysuria.  Musculoskeletal: Negative for back pain.  Skin: Negative for rash.  Neurological: Positive for headaches. Negative for weakness and light-headedness.  Hematological: Does not bruise/bleed easily.  Psychiatric/Behavioral: Negative for confusion.  All other systems reviewed and are negative.    Allergies  Methotrexate; Midazolam hcl; Ammonia; Cephalexin; Contrast media; Infliximab; Iohexol; Ketorolac tromethamine; Nalbuphine; Nsaids; Ultram; and Celecoxib  Home  Medications   Current Outpatient Rx  Name  Route  Sig  Dispense  Refill  . CARISOPRODOL 350 MG PO TABS   Oral   Take 350 mg by mouth 4 (four) times daily as needed. Muscle pain         . TEFLARO IV   Intravenous   Inject into the vein See admin instructions. Through PICC line for cellulitis. Every 12 hours.         Marland Kitchen CLONAZEPAM 1 MG PO TABS      TAKE 1 TABLET BY MOUTH TWICE DAILY AS NEEDED   60 tablet   0   . DOCUSATE SODIUM 100 MG PO CAPS   Oral   Take 200 mg by mouth 2 (two) times daily.          . DULOXETINE HCL 60 MG PO CPEP   Oral   Take 1 capsule (60 mg total) by mouth 2 (two) times daily.   180 capsule   1   . EPINEPHRINE 0.3 MG/0.3ML IJ DEVI   Intramuscular   Inject 0.3 mg into the muscle once as needed. For bee stings.          . ESOMEPRAZOLE MAGNESIUM 40 MG PO CPDR   Oral   Take 40 mg by mouth daily before breakfast.         . FUROSEMIDE 20 MG PO TABS   Oral   Take 1 tablet (20 mg total) by mouth 2 (two) times daily.   60 tablet   5   . GABAPENTIN 300 MG PO CAPS      TAKE 3 CAPSULES BY MOUTH THREE TIMES DAILY   810 capsule   0   . HYDROCHLOROTHIAZIDE 25 MG PO TABS   Oral   Take 25 mg by mouth daily.         Marland Kitchen HYDROCODONE-ACETAMINOPHEN 10-325 MG PO TABS   Oral   Take 1-2 tablets by mouth every 8 (eight) hours as needed for pain. DO NOT FILL THIS PRESCRIPTION UNTIL 12/12/11 Take 1-2 tabs po Q 8 hours prn pain.  This is a 3 week supply Pt should ONLY get narcotics from Dr Cyndie Chime or Lonna Cobb, NP. [facility])   126 tablet   0   . ONDANSETRON HCL 4 MG PO TABS   Oral   Take 4 mg by mouth every 4 (four) hours as needed. Nausea.         Marland Kitchen ONDANSETRON 8 MG PO TBDP   Oral   Take 8 mg by mouth every 4 (four) hours as needed. Nausea.         Marland Kitchen POTASSIUM CHLORIDE CRYS ER 20 MEQ PO TBCR   Oral   Take 20 mEq by mouth 2 (two) times daily. Or as  directed          . PREDNISONE 10 MG PO TABS   Oral   Take 10 mg  by mouth daily.         Marland Kitchen PROBIOTIC FORMULA PO CAPS   Oral   Take 1 capsule by mouth daily.          Marland Kitchen PROMETHAZINE HCL 25 MG RE SUPP   Rectal   Place 25 mg rectally every 4 (four) hours as needed. Nausea.         Marland Kitchen PROMETHAZINE HCL 25 MG PO TABS               . ROPINIROLE HCL 1 MG PO TABS   Oral   Take 1 tablet (1 mg total) by mouth at bedtime.   30 tablet   2   . SPIRONOLACTONE-HCTZ 25-25 MG PO TABS   Oral   Take 1 tablet by mouth 2 (two) times daily.   180 tablet   1   . TRAZODONE HCL 150 MG PO TABS   Oral   Take 0.5-1 tablets (75-150 mg total) by mouth at bedtime.   30 tablet   2   . HYDROMORPHONE HCL 2 MG PO TABS   Oral   Take 1 tablet (2 mg total) by mouth every 4 (four) hours as needed for pain.   8 tablet   0   . SULFAMETHOXAZOLE-TRIMETHOPRIM 800-160 MG PO TABS   Oral   Take 1 tablet by mouth every 12 (twelve) hours.   10 tablet   0     BP 141/68  Pulse 78  Temp 98 F (36.7 C)  Resp 16  SpO2 99%  Physical Exam  Constitutional: She is oriented to person, place, and time. She appears well-developed and well-nourished. No distress.  HENT:  Head: Normocephalic and atraumatic.  Eyes: Conjunctivae normal and EOM are normal.  Neck: Normal range of motion. Neck supple.  Cardiovascular: Normal rate, regular rhythm and intact distal pulses.   No murmur heard. Pulmonary/Chest: Effort normal and breath sounds normal. No respiratory distress.  Abdominal: Soft. Bowel sounds are normal. She exhibits no distension.  Musculoskeletal: Normal range of motion. She exhibits no edema.  Neurological: She is alert and oriented to person, place, and time. No cranial nerve deficit.  Skin: Skin is warm and dry.  Psychiatric: She has a normal mood and affect. Thought content normal.    ED Course  Procedures (including critical care time) 56 year old, female, with history of metastatic breast cancer, which is cured, lupus, and migraines presents to the ER with  a headache, which is different from her migraines, along with dysuria and a cough, and green sputum.  We will perform a CAT scan, chest x-ray, and laboratory testing, for evaluation and treat her with analgesics, and antiemetics   Labs Reviewed  BASIC METABOLIC PANEL  CBC WITH DIFFERENTIAL  URINALYSIS, ROUTINE W REFLEX MICROSCOPIC   No results found.   No diagnosis found.  12:10 AM Spoke with Dr. Lovell Sheehan.  She will admit for tx of iatrogenic hyponatremia  MDM  Headache, with dysuria, and productive cough No neuro deficits. No ams.  No signs cns or systemic illness  Iatrogenic hyponatremia        Cheri Guppy, MD 12/17/11 0011

## 2011-12-17 DIAGNOSIS — G43909 Migraine, unspecified, not intractable, without status migrainosus: Secondary | ICD-10-CM

## 2011-12-17 DIAGNOSIS — E871 Hypo-osmolality and hyponatremia: Secondary | ICD-10-CM | POA: Diagnosis present

## 2011-12-17 LAB — BASIC METABOLIC PANEL
CO2: 29 mEq/L (ref 19–32)
Chloride: 93 mEq/L — ABNORMAL LOW (ref 96–112)
Creatinine, Ser: 0.97 mg/dL (ref 0.50–1.10)
GFR calc Af Amer: 74 mL/min — ABNORMAL LOW (ref >90)
Potassium: 3 mEq/L — ABNORMAL LOW (ref 3.5–5.1)

## 2011-12-17 LAB — GLUCOSE, CAPILLARY
Glucose-Capillary: 94 mg/dL (ref 70–99)
Glucose-Capillary: 80 mg/dL (ref 70–99)
Glucose-Capillary: 220 mg/dL — ABNORMAL HIGH (ref 70–99)

## 2011-12-17 LAB — HEMOGLOBIN A1C: Mean Plasma Glucose: 131 mg/dL — ABNORMAL HIGH (ref <117)

## 2011-12-17 LAB — CBC
HCT: 30.7 % — ABNORMAL LOW (ref 36.0–46.0)
Hemoglobin: 9.5 g/dL — ABNORMAL LOW (ref 12.0–15.0)
MCV: 81.4 fL (ref 78.0–100.0)
RBC: 3.77 MIL/uL — ABNORMAL LOW (ref 3.87–5.11)
WBC: 6.5 10*3/uL (ref 4.0–10.5)

## 2011-12-17 LAB — MRSA PCR SCREENING: MRSA by PCR: NEGATIVE

## 2011-12-17 MED ORDER — ONDANSETRON HCL 4 MG PO TABS: 4.0000 mg | ORAL_TABLET | Freq: Four times a day (QID) | ORAL | Status: DC | PRN

## 2011-12-17 MED ORDER — SODIUM CHLORIDE 0.9 % IV SOLN: INTRAVENOUS | Status: AC

## 2011-12-17 MED ORDER — ZOLPIDEM TARTRATE 5 MG PO TABS: 5.0000 mg | ORAL_TABLET | Freq: Every evening | ORAL | Status: DC | PRN

## 2011-12-17 MED ORDER — ONDANSETRON HCL 4 MG/2ML IJ SOLN
4.0000 mg | Freq: Four times a day (QID) | INTRAMUSCULAR | Status: DC | PRN
  Administered 2011-12-17: 4 mg via INTRAVENOUS
  Filled 2011-12-17: qty 2

## 2011-12-17 MED ORDER — HYDROMORPHONE HCL PF 2 MG/ML IJ SOLN
2.0000 mg | Freq: Once | INTRAMUSCULAR | Status: AC
  Administered 2011-12-17: 2 mg via INTRAVENOUS
  Filled 2011-12-17: qty 1

## 2011-12-17 MED ORDER — INSULIN ASPART 100 UNIT/ML ~~LOC~~ SOLN
0.0000 [IU] | Freq: Three times a day (TID) | SUBCUTANEOUS | Status: DC
  Administered 2011-12-17: 2 [IU] via SUBCUTANEOUS
  Administered 2011-12-18: 1 [IU] via SUBCUTANEOUS

## 2011-12-17 MED ORDER — TRAZODONE HCL 150 MG PO TABS
75.0000 mg | ORAL_TABLET | Freq: Every day | ORAL | Status: DC
  Filled 2011-12-17: qty 1

## 2011-12-17 MED ORDER — CLONAZEPAM 0.5 MG PO TABS
0.5000 mg | ORAL_TABLET | Freq: Two times a day (BID) | ORAL | Status: DC | PRN
  Administered 2011-12-17 – 2011-12-18 (×2): 0.5 mg via ORAL
  Filled 2011-12-17 (×2): qty 1

## 2011-12-17 MED ORDER — PROBIOTIC FORMULA PO CAPS: 1.0000 | ORAL_CAPSULE | Freq: Every day | ORAL | Status: DC

## 2011-12-17 MED ORDER — ENOXAPARIN SODIUM 40 MG/0.4ML ~~LOC~~ SOLN
40.0000 mg | SUBCUTANEOUS | Status: DC
  Administered 2011-12-17 – 2011-12-18 (×2): 40 mg via SUBCUTANEOUS
  Filled 2011-12-17 (×2): qty 0.4

## 2011-12-17 MED ORDER — CARISOPRODOL 350 MG PO TABS
350.0000 mg | ORAL_TABLET | Freq: Four times a day (QID) | ORAL | Status: DC
  Administered 2011-12-17 – 2011-12-18 (×4): 350 mg via ORAL
  Filled 2011-12-17 (×4): qty 1

## 2011-12-17 MED ORDER — GABAPENTIN 300 MG PO CAPS
900.0000 mg | ORAL_CAPSULE | Freq: Three times a day (TID) | ORAL | Status: DC
  Administered 2011-12-17 – 2011-12-18 (×4): 900 mg via ORAL
  Filled 2011-12-17 (×6): qty 3

## 2011-12-17 MED ORDER — RISAQUAD PO CAPS
1.0000 | ORAL_CAPSULE | Freq: Every day | ORAL | Status: DC
  Administered 2011-12-17 – 2011-12-18 (×2): 1 via ORAL
  Filled 2011-12-17 (×2): qty 1

## 2011-12-17 MED ORDER — OXYCODONE HCL 5 MG PO TABS
5.0000 mg | ORAL_TABLET | ORAL | Status: DC | PRN
  Administered 2011-12-17 (×2): 5 mg via ORAL
  Filled 2011-12-17 (×4): qty 1

## 2011-12-17 MED ORDER — DOCUSATE SODIUM 100 MG PO CAPS
200.0000 mg | ORAL_CAPSULE | Freq: Two times a day (BID) | ORAL | Status: DC
  Administered 2011-12-17 – 2011-12-18 (×3): 200 mg via ORAL
  Filled 2011-12-17 (×4): qty 2

## 2011-12-17 MED ORDER — DULOXETINE HCL 60 MG PO CPEP
60.0000 mg | ORAL_CAPSULE | Freq: Two times a day (BID) | ORAL | Status: DC
  Administered 2011-12-17 – 2011-12-18 (×3): 60 mg via ORAL
  Filled 2011-12-17 (×4): qty 1

## 2011-12-17 MED ORDER — ROPINIROLE HCL 1 MG PO TABS
1.0000 mg | ORAL_TABLET | Freq: Every day | ORAL | Status: DC
  Administered 2011-12-17: 1 mg via ORAL
  Filled 2011-12-17 (×2): qty 1

## 2011-12-17 MED ORDER — PREDNISONE 10 MG PO TABS
10.0000 mg | ORAL_TABLET | Freq: Every day | ORAL | Status: DC
  Administered 2011-12-17 – 2011-12-18 (×2): 10 mg via ORAL
  Filled 2011-12-17 (×2): qty 1

## 2011-12-17 MED ORDER — SODIUM CHLORIDE 0.9 % IJ SOLN: 3.0000 mL | Freq: Two times a day (BID) | INTRAMUSCULAR | Status: DC

## 2011-12-17 MED ORDER — HYDROCODONE-ACETAMINOPHEN 10-325 MG PO TABS
1.0000 | ORAL_TABLET | Freq: Three times a day (TID) | ORAL | Status: DC | PRN
  Administered 2011-12-17 – 2011-12-18 (×3): 2 via ORAL
  Filled 2011-12-17 (×3): qty 2

## 2011-12-17 MED ORDER — SODIUM CHLORIDE 0.9 % IV SOLN
600.0000 mg | Freq: Two times a day (BID) | INTRAVENOUS | Status: DC
  Administered 2011-12-17 – 2011-12-18 (×3): 600 mg via INTRAVENOUS
  Filled 2011-12-17 (×4): qty 600

## 2011-12-17 MED ORDER — PROMETHAZINE HCL 25 MG/ML IJ SOLN
25.0000 mg | Freq: Four times a day (QID) | INTRAMUSCULAR | Status: DC | PRN
  Administered 2011-12-17 – 2011-12-18 (×2): 25 mg via INTRAVENOUS
  Filled 2011-12-17 (×2): qty 1

## 2011-12-17 MED ORDER — ACETAMINOPHEN 325 MG PO TABS: 650.0000 mg | ORAL_TABLET | Freq: Four times a day (QID) | ORAL | Status: DC | PRN

## 2011-12-17 MED ORDER — ACETAMINOPHEN 650 MG RE SUPP: 650.0000 mg | Freq: Four times a day (QID) | RECTAL | Status: DC | PRN

## 2011-12-17 MED ORDER — TRAZODONE HCL 50 MG PO TABS
75.0000 mg | ORAL_TABLET | Freq: Every day | ORAL | Status: DC
  Administered 2011-12-17: 75 mg via ORAL
  Filled 2011-12-17 (×2): qty 3

## 2011-12-17 MED ORDER — PANTOPRAZOLE SODIUM 40 MG PO TBEC
40.0000 mg | DELAYED_RELEASE_TABLET | Freq: Every day | ORAL | Status: DC
  Administered 2011-12-17 – 2011-12-18 (×2): 40 mg via ORAL
  Filled 2011-12-17 (×2): qty 1

## 2011-12-17 MED ORDER — HYDROMORPHONE HCL PF 1 MG/ML IJ SOLN
0.5000 mg | INTRAMUSCULAR | Status: DC | PRN
  Administered 2011-12-17 – 2011-12-18 (×5): 1 mg via INTRAVENOUS
  Administered 2011-12-18: 0.5 mg via INTRAVENOUS
  Administered 2011-12-18: 1 mg via INTRAVENOUS
  Filled 2011-12-17 (×7): qty 1

## 2011-12-17 MED ORDER — ALUM & MAG HYDROXIDE-SIMETH 200-200-20 MG/5ML PO SUSP: 30.0000 mL | Freq: Four times a day (QID) | ORAL | Status: DC | PRN

## 2011-12-17 MED ORDER — PROMETHAZINE HCL 25 MG/ML IJ SOLN
25.0000 mg | Freq: Once | INTRAMUSCULAR | Status: AC
  Administered 2011-12-17: 25 mg via INTRAVENOUS
  Filled 2011-12-17: qty 1

## 2011-12-17 MED ORDER — DIPHENHYDRAMINE HCL 50 MG/ML IJ SOLN
50.0000 mg | Freq: Once | INTRAMUSCULAR | Status: AC
  Administered 2011-12-17: 50 mg via INTRAVENOUS
  Filled 2011-12-17: qty 1

## 2011-12-17 MED ORDER — INSULIN ASPART 100 UNIT/ML ~~LOC~~ SOLN: 0.0000 [IU] | Freq: Every day | SUBCUTANEOUS | Status: DC

## 2011-12-17 MED ORDER — MAGNESIUM HYDROXIDE 400 MG/5ML PO SUSP
30.0000 mL | Freq: Once | ORAL | Status: AC
  Administered 2011-12-17: 30 mL via ORAL
  Filled 2011-12-17: qty 30

## 2011-12-17 MED ORDER — SODIUM CHLORIDE 0.9 % IV SOLN
INTRAVENOUS | Status: DC
  Administered 2011-12-17 – 2011-12-18 (×2): via INTRAVENOUS

## 2011-12-17 NOTE — Progress Notes (Signed)
Patient arrived to the unit heavily sedated. She is not responding to voice. She has to be shaken and physically manipulated to be aroused. Respirations ranging between 7-9 breaths per minute. Patient has been placed on continuous pulse ox with 02 saturation of 100% on 2liters with heart rate of 68. RN will continue to monitor.

## 2011-12-17 NOTE — Progress Notes (Signed)
[facility]             Progress Note   Subjective: Complains of "terrible headache".  Objective: Vital signs in last 24 hours: Temp:  [97.4 F (36.3 C)-98.3 F (36.8 C)] 98.3 F (36.8 C) (11/09 1028) Pulse Rate:  [66-87] 87  (11/09 1028) Resp:  [8-18] 16  (11/09 1028) BP: (121-157)/(65-87) 127/78 mmHg (11/09 1028) SpO2:  [91 %-100 %] 91 % (11/09 1028) Weight:  [79.379 kg (175 lb)] 79.379 kg (175 lb) (11/09 0233) Weight change:  Last BM Date: 12/15/11  Intake/Output from previous day:   Total I/O In: 120 [P.O.:120] Out: 800 [Urine:800]   Physical Exam: General: Alert, awake, oriented x3, in no acute distress. HEENT: No bruits, no goiter. Heart: Regular rate and rhythm, without murmurs, rubs, gallops. Lungs: Clear to auscultation bilaterally. Abdomen: Soft, nontender, nondistended, positive bowel sounds. Extremities: No clubbing cyanosis or edema with positive pedal pulses.    Lab Results: Basic Metabolic Panel:  Basename 12/17/11 0955 12/16/11 2200  NA 133* 126*  K 3.0* 3.0*  CL 93* 86*  CO2 29 30  GLUCOSE 112* 102*  BUN 14 23  CREATININE 0.97 1.11*  CALCIUM 9.0 8.9  MG -- --  PHOS -- --   CBC:  Basename 12/17/11 0955 12/16/11 2200  WBC 6.5 6.6  NEUTROABS -- 4.5  HGB 9.5* 9.4*  HCT 30.7* 29.2*  MCV 81.4 79.6  PLT 219 226   CBG:  Basename 12/17/11 0736 12/17/11 0241 12/17/11 0122  GLUCAP 80 90 94   Urine Drug Screen: Drugs of Abuse     Component Value Date/Time   LABOPIA POSITIVE* 03/08/2009 2244   COCAINSCRNUR NONE DETECTED 03/08/2009 2244   LABBENZ POSITIVE* 03/08/2009 2244   AMPHETMU NONE DETECTED 03/08/2009 2244   THCU NONE DETECTED 03/08/2009 2244   LABBARB  Value: NONE DETECTED        DRUG SCREEN FOR MEDICAL PURPOSES ONLY.  IF CONFIRMATION IS NEEDED FOR ANY PURPOSE, NOTIFY LAB WITHIN 5 DAYS.        LOWEST DETECTABLE LIMITS FOR URINE DRUG SCREEN Drug Class       Cutoff (ng/mL) Amphetamine      1000 Barbiturate      200  Benzodiazepine   200 Tricyclics       300 Opiates          300 Cocaine          300 THC              50 03/08/2009 2244    Urinalysis:  Basename 12/16/11 2218  COLORURINE YELLOW  LABSPEC 1.013  PHURINE 7.0  GLUCOSEU NEGATIVE  HGBUR NEGATIVE  BILIRUBINUR NEGATIVE  KETONESUR NEGATIVE  PROTEINUR NEGATIVE  UROBILINOGEN 0.2  NITRITE NEGATIVE  LEUKOCYTESUR NEGATIVE    Recent Results (from the past 240 hour(s))  MRSA PCR SCREENING     Status: Normal   Collection Time   12/17/11  8:53 AM      Component Value Range Status Comment   MRSA by PCR NEGATIVE  NEGATIVE Final     Studies/Results: Dg Chest 2 View  12/16/2011  *RADIOLOGY REPORT*  Clinical Data: Intermittent chest pain and cough; history of diabetes and hepatic metastases.  CHEST - 2 VIEW  Comparison: Chest radiograph performed 12/23/2010, and CT of the chest performed 05/20/2011  Findings: The lungs are well-aerated and clear.  There is no evidence of focal opacification, pleural effusion or pneumothorax.  The heart is normal in size; the mediastinal contour is  within normal limits.  No acute osseous abnormalities are seen.  The patient's right PICC is noted ending about the right brachiocephalic vein, near the SVC.  IMPRESSION: No acute cardiopulmonary process seen.   Original Report Authenticated By: Tonia Ghent, M.D.    Ct Head Wo Contrast  12/16/2011  *RADIOLOGY REPORT*  Clinical Data: Persistent headache  CT HEAD WITHOUT CONTRAST  Technique:  Contiguous axial images were obtained from the base of the skull through the vertex without contrast.  Comparison: 03/19/2011  Findings:  The gray-white differentiation is maintained.  No CT evidence of acute large territory infarct.  Bilateral basal ganglial calcifications.  No intraparenchymal or extra-axial mass or hemorrhage.  Normal size and configuration of the ventricles and basilar cisterns.  No midline shift.  Limited visualization of paranasal sinuses and mastoid air cells are normal.   Regional soft tissues are normal.  No displaced calvarial fracture.  IMPRESSION: Negative noncontrast head CT.   Original Report Authenticated By: Tacey Ruiz, MD     Medications: Scheduled Meds:   . sodium chloride   Intravenous STAT  . acidophilus  1 capsule Oral Daily  . carisoprodol  350 mg Oral QID  . ceFTAROline (TEFLARO) IV  600 mg Intravenous Q12H  . [COMPLETED] diphenhydrAMINE  50 mg Intravenous Once  . docusate sodium  200 mg Oral BID  . DULoxetine  60 mg Oral BID  . enoxaparin (LOVENOX) injection  40 mg Subcutaneous Q24H  . gabapentin  900 mg Oral TID  . [COMPLETED]  HYDROmorphone (DILAUDID) injection  1 mg Intravenous Once  . [COMPLETED]  HYDROmorphone (DILAUDID) injection  3 mg Intramuscular Once  . insulin aspart  0-5 Units Subcutaneous QHS  . insulin aspart  0-9 Units Subcutaneous TID WC  . [COMPLETED] ondansetron (ZOFRAN) IV  4 mg Intravenous Once  . pantoprazole  40 mg Oral Daily  . predniSONE  10 mg Oral Daily  . [COMPLETED] promethazine  25 mg Intravenous Once  . [COMPLETED] promethazine  25 mg Intravenous Once  . rOPINIRole  1 mg Oral QHS  . sodium chloride  3 mL Intravenous Q12H  . traZODone  75-150 mg Oral QHS  . [DISCONTINUED]  HYDROmorphone (DILAUDID) injection  1 mg Intravenous Once  . [DISCONTINUED] PROBIOTIC FORMULA  1 capsule Oral Daily  . [DISCONTINUED] traZODone  75-150 mg Oral QHS   Continuous Infusions:   . sodium chloride 75 mL/hr at 12/17/11 0320  . [DISCONTINUED] sodium chloride 125 mL/hr at 12/16/11 2354   PRN Meds:.acetaminophen, acetaminophen, alum & mag hydroxide-simeth, clonazePAM, HYDROcodone-acetaminophen, HYDROmorphone (DILAUDID) injection, ondansetron (ZOFRAN) IV, ondansetron, oxyCODONE, zolpidem  Assessment/Plan:  Principal Problem:  *Hyponatremia Active Problems:  Breats Ca, 08/2009 - hx liver mets, resolved s/p chemo  DIABETES MELLITUS, TYPE II  HYPERLIPIDEMIA  MIGRAINE HEADACHE  Normocytic anemia  SLE-on  prednisone-Follow at [facility]    Hyponatremia -From 126 on admission to 130 today with IVF. -Continue IVF for now.  Migraine -Will add a benadryl/phenergan combo that has worked for her in the past.  Disposition -Plan on DC in am unless overnight events occur.  Time spent coordinating care: 35 minutes.   LOS: 1 day   [facility] Pager: [phone] 12/17/2011, 11:53 AM

## 2011-12-17 NOTE — Progress Notes (Signed)
Utilization Review Completed.   Dachelle Molzahn, RN, BSN Nurse Case Manager  [phone]  

## 2011-12-17 NOTE — ED Notes (Signed)
Patient had a period that her saturations dropped to 80%, went in, pt was arousal and answering questions, and applied 02 via nasal cannula. Noted that patient become increasingly sleepy and falling asleep during questioning. Notified Dr. Lovell Sheehan who came to the bedside to assess the patient

## 2011-12-17 NOTE — Progress Notes (Signed)
[name] Carr has been very argumentive with nursing staff in reference to her narcotic pain medications. She is requesting increase amounts of pain medications although she usually sound asleep upon assessment. Her respiratory status was a concern last night and tonight it is her blood pressure. Her blood pressure is low and I explained that I would have to reassess prior to giving iv dilaudid when it is due. She became very upset because " this is my norm". Her bp was 90/52 in addition to her scheduled medications that could potentially decrease her blood pressure. I will attempt to continue patient education in reference to safe medication administration.

## 2011-12-17 NOTE — Progress Notes (Signed)
  Pharmacy Note - Home Med Ceftaroline  Pt reportedly on ceftaroline prior to admission for erysipelas (face/neck) treatment. Contacted [facility] office this am [phone]) and spoke with oncall RN who relay the following info: "patient came from [facility], order for ceftaroline 600mg  BID x7 days, prescribed by Dr. Ronda Fairly" (who is an ID MD, his office is currently closed). Start date of ceftaroline is believed to be 12/13/11 per [facility] paperwork.  Discussed this updated info with Dr. Ardyth Harps who would like to finish the remaining 3 days of ceftaroline while in hospital. Verbal orders will be placed to resume this therapy.  Darrol Angel, PharmD Pager: [phone] 12/17/2011 9:32 AM

## 2011-12-17 NOTE — Progress Notes (Signed)
PER CHEST XRAY FROM 12/16/2011 PICC TIP IS IN BRACHIOCEPHALIC VEIN - NEAR SVC. NOT PROPER PLACEMENT FOR THIS LINE. EXPLAINED TO STAFF RN AND MD THIS IS NOT PROPER PLACEMENT FOR THIS LINE. EXPLAINED THE ONLY WAY TO MAKE PICC LONGER IS TO EXCHANGE THE LINE. MD AWARE.

## 2011-12-17 NOTE — ED Notes (Signed)
Rn unavailable at this time. Will call back for report

## 2011-12-17 NOTE — H&P (Signed)
[facility] History and Physical  [name] Carr ZOX:[id number] DOB: [birth year] DOA: 12/16/2011  Referring physician:  PCP: Rene Paci, MD  Specialists:   Chief Complaint: Severe  Headache  HPI: [name] Carr is a 56 y.o. female who presents to the ED with complaints of a severe 10/10  Migraine Headache for the past 4 days.  She reports not being able to sleep for the past 72 hours due to the pain.  She has multiple medical problems and in the ED she was found to have a sodium level of 126.  She was medicated for her headache pain and sent for a CT scan of the brain due to her symptoms and history of breast cancer with treated metastatic disease.  The CT scan of the brain was negative for evidence of metastatic disease at this time.  She was referred for medical admission due to her hyponatremia.     Review of Systems: The patient denies anorexia, fever, weight loss,, vision loss, decreased hearing, hoarseness, chest pain, syncope, dyspnea on exertion, peripheral edema, balance deficits, hemoptysis, abdominal pain, melena, hematochezia, severe indigestion/heartburn, hematuria, incontinence, genital sores, muscle weakness, suspicious skin lesions, transient blindness, difficulty walking, depression, unusual weight change, abnormal bleeding, enlarged lymph nodes, angioedema, and breast masses.    Past Medical History  Diagnosis Date  . PERSONALITY DISORDER   . Chronic pain syndrome     chronic narcotics, dependancy hx - [place] for pain mgmt  . MIGRAINE HEADACHE   . Lyme disease   . ALLERGIC RHINITIS   . ANEMIA-IRON DEFICIENCY   . HYPERLIPIDEMIA   . GERD   . DIABETES MELLITUS, TYPE II   . DEPRESSION   . SLE (systemic lupus erythematosus)     [facility] - chronic pred  . Psoriasis   . Addison's disease   . Degenerative joint disease   . DDD (degenerative disc disease)   . Phlebitis after infusion     left ?calf; "S/P phenergan/demerol injection"  . Blood clot  in vein 2011; 02/04/11    "right jugular vein;left jugular vein"  . Hypothyroidism   . SEIZURE DISORDER     "lots of seizures; due to lupus"  . Anxiety   . Neuropathy     hands, feet, due to diabetes & back problem  . Surgical wound infection 02/15/2011    R shoulder and mastectomy site - related to PICC  . CARCINOMA, BREAST 08/2009 dx    R breast,  mets to liver - resolution s/p chemo, s/p B mastect  . Metastases to the liver    Past Surgical History  Procedure Date  . Liver bopsy 09/2009  . Port a cath placement 09/2009; 11/19/10  . Port-a-cath removal 04/2010    "due to staph & strept"  . Port-a-cath removal 12/23/2010    Procedure: REMOVAL PORT-A-CATH;  Surgeon: Valarie Merino, MD;  Location: [facility];  Service: General;  Laterality: Left;  . Breast surgery 09/2009    right breast biopsy  . Mastectomy 11/19/10    bilateral mastectomy   . I&d extremity ~ 2010    right hand; S/P "dog bite"  . Peripherally inserted central catheter insertion    Medications:  HOME MEDS: Prior to Admission medications   Medication Sig Start Date End Date Taking? Authorizing Provider  carisoprodol (SOMA) 350 MG tablet Take 350 mg by mouth 4 (four) times daily as needed. Muscle pain 09/02/11  Yes Newt Lukes, MD  Ceftaroline Fosamil (TEFLARO IV) Inject into  the vein See admin instructions. Through PICC line for cellulitis. Every 12 hours. 12/13/11  Yes Historical Provider, MD  clonazePAM (KLONOPIN) 1 MG tablet TAKE 1 TABLET BY MOUTH TWICE DAILY AS NEEDED 12/09/11  Yes Newt Lukes, MD  docusate sodium (COLACE) 100 MG capsule Take 200 mg by mouth 2 (two) times daily.    Yes Historical Provider, MD  DULoxetine (CYMBALTA) 60 MG capsule Take 1 capsule (60 mg total) by mouth 2 (two) times daily. 11/23/11  Yes Newt Lukes, MD  EPINEPHrine (EPI-PEN) 0.3 mg/0.3 mL DEVI Inject 0.3 mg into the muscle once as needed. For bee stings.  07/14/10  Yes Newt Lukes, MD  esomeprazole (NEXIUM) 40 MG  capsule Take 40 mg by mouth daily before breakfast.   Yes Historical Provider, MD  furosemide (LASIX) 20 MG tablet Take 1 tablet (20 mg total) by mouth 2 (two) times daily. 10/20/11  Yes Newt Lukes, MD  gabapentin (NEURONTIN) 300 MG capsule TAKE 3 CAPSULES BY MOUTH THREE TIMES DAILY 12/10/11  Yes Newt Lukes, MD  hydrochlorothiazide (HYDRODIURIL) 25 MG tablet Take 25 mg by mouth daily.   Yes Historical Provider, MD  HYDROcodone-acetaminophen (NORCO) 10-325 MG per tablet Take 1-2 tablets by mouth every 8 (eight) hours as needed for pain. DO NOT FILL THIS PRESCRIPTION UNTIL 12/12/11 Take 1-2 tabs po Q 8 hours prn pain.  This is a 3 week supply Pt should ONLY get narcotics from Dr Cyndie Chime or Lonna Cobb, NP. [facility]) 12/09/11  Yes Levert Feinstein, MD  ondansetron (ZOFRAN) 4 MG tablet Take 4 mg by mouth every 4 (four) hours as needed. Nausea.   Yes Historical Provider, MD  ondansetron (ZOFRAN-ODT) 8 MG disintegrating tablet Take 8 mg by mouth every 4 (four) hours as needed. Nausea.   Yes Historical Provider, MD  potassium chloride SA (K-DUR,KLOR-CON) 20 MEQ tablet Take 20 mEq by mouth 2 (two) times daily. Or as directed  10/01/10  Yes Newt Lukes, MD  predniSONE (DELTASONE) 10 MG tablet Take 10 mg by mouth daily.   Yes Historical Provider, MD  Probiotic Product (PROBIOTIC FORMULA) CAPS Take 1 capsule by mouth daily.    Yes Historical Provider, MD  promethazine (PHENERGAN) 25 MG suppository Place 25 mg rectally every 4 (four) hours as needed. Nausea. 06/02/11  Yes Newt Lukes, MD  promethazine (PHENERGAN) 25 MG tablet  12/15/11  Yes Newt Lukes, MD  rOPINIRole (REQUIP) 1 MG tablet Take 1 tablet (1 mg total) by mouth at bedtime. 11/04/11 11/03/12 Yes Newt Lukes, MD  spironolactone-hydrochlorothiazide (ALDACTAZIDE) 25-25 MG per tablet Take 1 tablet by mouth 2 (two) times daily. 11/23/11  Yes Newt Lukes, MD  traZODone (DESYREL) 150 MG  tablet Take 0.5-1 tablets (75-150 mg total) by mouth at bedtime. 11/23/11  Yes Newt Lukes, MD  HYDROmorphone (DILAUDID) 2 MG tablet Take 1 tablet (2 mg total) by mouth every 4 (four) hours as needed for pain. 11/21/11   Remi Haggard, NP  sulfamethoxazole-trimethoprim (SEPTRA DS) 800-160 MG per tablet Take 1 tablet by mouth every 12 (twelve) hours. 11/21/11   Remi Haggard, NP    Allergies:  Allergies  Allergen Reactions  . Methotrexate Anaphylaxis  . Midazolam Hcl Anaphylaxis  . Ammonia Other (See Comments)    Ended up on ventilator from ammonia tabs  . Cephalexin Nausea And Vomiting and Other (See Comments)    Blisters.  . Contrast Media (Iodinated Diagnostic Agents) Other (See Comments)  Doesn't breath well.  . Infliximab Nausea And Vomiting  . Iohexol Other (See Comments)    SOB   . Ketorolac Tromethamine Other (See Comments)    Injectable doesn't work and pill hurts stomach.  . Nalbuphine Other (See Comments)    Unknown.  . Nsaids Nausea And Vomiting  . Ultram (Tramadol Hcl)   . Celecoxib Rash     Allergies  Allergen Reactions  . Methotrexate Anaphylaxis  . Midazolam Hcl Anaphylaxis  . Ammonia Other (See Comments)    Ended up on ventilator from ammonia tabs  . Cephalexin Nausea And Vomiting and Other (See Comments)    Blisters.  . Contrast Media (Iodinated Diagnostic Agents) Other (See Comments)    Doesn't breath well.  . Infliximab Nausea And Vomiting  . Iohexol Other (See Comments)    SOB   . Ketorolac Tromethamine Other (See Comments)    Injectable doesn't work and pill hurts stomach.  . Nalbuphine Other (See Comments)    Unknown.  . Nsaids Nausea And Vomiting  . Ultram (Tramadol Hcl)   . Celecoxib Rash    Social History:  reports that she has never smoked. She has never used smokeless tobacco. She reports that she does not drink alcohol or use illicit drugs.    Family History  Problem Relation Age of Onset  . Lung cancer Mother   . Lung  cancer Father   . Arthritis Other   . Diabetes Other   . Hyperlipidemia Other      Physical Exam:  GEN:  56 year old Obese Caucasian Female examined  and in no acute distress; cooperative with exam Filed Vitals:   12/16/11 2048 12/16/11 2216 12/16/11 2300 12/17/11 0054  BP: 141/68 135/65 135/75 130/78  Pulse: 78 73 72 68  Temp: 98 F (36.7 C)     Resp: 16 18  10   SpO2: 99% 97% 96% 96%   Blood pressure 130/78, pulse 68, temperature 98 F (36.7 C), resp. rate 10, SpO2 96.00%. PSYCH: She is drowsy but  alert and oriented x4; does not appear anxious does not appear depressed; affect is normal HEENT: Normocephalic and Atraumatic, Mucous membranes pink; PERRLA; EOM intact; Fundi:  Benign;  No scleral icterus, Nares: Patent, Oropharynx: Clear,  Fair Dentition, Neck:  FROM, no cervical lymphadenopathy nor thyromegaly or carotid bruit; no JVD; Breasts:: Not examined CHEST WALL: No tenderness CHEST: Normal respiration, clear to auscultation bilaterally HEART: Regular rate and rhythm; no murmurs rubs or gallops BACK: No kyphosis or scoliosis; no CVA tenderness ABDOMEN: Positive Bowel Sounds,  Obese, soft non-tender; no masses, no organomegaly.    Rectal Exam: Not done EXTREMITIES: No bone or joint deformity; age-appropriate arthropathy of the hands and knees; no cyanosis, clubbing or edema; no ulcerations. Genitalia: not examined PULSES: 2+ and symmetric SKIN: Normal hydration no rash or ulceration CNS: Cranial nerves 2-12 grossly intact no focal neurologic deficit   Labs on Admission:  Basic Metabolic Panel:  Lab 12/16/11 1610  NA 126*  K 3.0*  CL 86*  CO2 30  GLUCOSE 102*  BUN 23  CREATININE 1.11*  CALCIUM 8.9  MG --  PHOS --   Liver Function Tests: No results found for this basename: AST:5,ALT:5,ALKPHOS:5,BILITOT:5,PROT:5,ALBUMIN:5 in the last 168 hours No results found for this basename: LIPASE:5,AMYLASE:5 in the last 168 hours No results found for this basename:  AMMONIA:5 in the last 168 hours CBC:  Lab 12/16/11 2200  WBC 6.6  NEUTROABS 4.5  HGB 9.4*  HCT 29.2*  MCV 79.6  PLT 226   Cardiac Enzymes: No results found for this basename: CKTOTAL:5,CKMB:5,CKMBINDEX:5,TROPONINI:5 in the last 168 hours  BNP (last 3 results) No results found for this basename: PROBNP:3 in the last 8760 hours CBG: No results found for this basename: GLUCAP:5 in the last 168 hours  Radiological Exams on Admission: Dg Chest 2 View  12/16/2011  *RADIOLOGY REPORT*  Clinical Data: Intermittent chest pain and cough; history of diabetes and hepatic metastases.  CHEST - 2 VIEW  Comparison: Chest radiograph performed 12/23/2010, and CT of the chest performed 05/20/2011  Findings: The lungs are well-aerated and clear.  There is no evidence of focal opacification, pleural effusion or pneumothorax.  The heart is normal in size; the mediastinal contour is within normal limits.  No acute osseous abnormalities are seen.  The patient's right PICC is noted ending about the right brachiocephalic vein, near the SVC.  IMPRESSION: No acute cardiopulmonary process seen.   Original Report Authenticated By: Tonia Ghent, M.D.    Ct Head Wo Contrast  12/16/2011  *RADIOLOGY REPORT*  Clinical Data: Persistent headache  CT HEAD WITHOUT CONTRAST  Technique:  Contiguous axial images were obtained from the base of the skull through the vertex without contrast.  Comparison: 03/19/2011  Findings:  The gray-white differentiation is maintained.  No CT evidence of acute large territory infarct.  Bilateral basal ganglial calcifications.  No intraparenchymal or extra-axial mass or hemorrhage.  Normal size and configuration of the ventricles and basilar cisterns.  No midline shift.  Limited visualization of paranasal sinuses and mastoid air cells are normal.  Regional soft tissues are normal.  No displaced calvarial fracture.  IMPRESSION: Negative noncontrast head CT.   Original Report Authenticated By: Tacey Ruiz, MD     EKG: Independently reviewed.   Assessment/Plan Principal Problem:  *Hyponatremia Active Problems:  Breats Ca, 08/2009 - hx liver mets, resolved s/p chemo  DIABETES MELLITUS, TYPE II  HYPERLIPIDEMIA  MIGRAINE HEADACHE  Normocytic anemia  SLE-on prednisone-Follow at [facility]  Plan: Admit to Telemetry Bed IVFs  With NSS and Monitor Na+ Levels, Check Urine electrolytes and TSH level.   Reconcile Home Medications DVT Prophylaxis   Code Status:   FULL CODE Family Communication:  N/A Disposition Plan:   Return to Home   Time spent: 90 Minutes  Ron Parker [facility] Pager [phone] If 7PM-7AM, please contact night-coverage [email or web address] Password [email or web address] 12/17/2011, 1:09 AM

## 2011-12-18 DIAGNOSIS — G8929 Other chronic pain: Secondary | ICD-10-CM

## 2011-12-18 DIAGNOSIS — R51 Headache: Secondary | ICD-10-CM

## 2011-12-18 DIAGNOSIS — E871 Hypo-osmolality and hyponatremia: Secondary | ICD-10-CM

## 2011-12-18 DIAGNOSIS — M549 Dorsalgia, unspecified: Secondary | ICD-10-CM

## 2011-12-18 MED ORDER — POTASSIUM CHLORIDE CRYS ER 20 MEQ PO TBCR
40.0000 meq | EXTENDED_RELEASE_TABLET | Freq: Once | ORAL | Status: AC
  Administered 2011-12-18: 40 meq via ORAL
  Filled 2011-12-18: qty 2

## 2011-12-18 NOTE — Progress Notes (Signed)
Pt with constant and several complaints of different kind;  has been requesting pain meds frequently with complaints of severe headache that would not good away. Pt medicated prn when meds due. Medicated at 1014 with prn dilaudid. PT had been discharged by MD; but  Pt was mad about the fact that she had not been receiving her medications like she took them  at home.  And that because of that she was not going to sign  her discharge. She also threatened not to leave to go home. Security was notified as standby to help facilitate discharge if needed.  At about 1130, during rounds when this RN walked down the hall,  pt noted holding up a 3-ml syringe  filled up to 2 ml with a clear unknown substance. When asked what it was, pt reports it was her benadryl that she had drawn up. Syringe taken from pt. Pt advised that it is against hospital policy to use her home meds without a dr.'s order. AC, sara, was on floor and notified. In the presence of AC, security and other staff on the floor ,  Med was discarded in the sharps' container. Pt also made aware that for her safety she could not have or use her home meds while in the hospital. Pt was agitated and requested her syringe; but told that she could not have it back. Later she reported that had toradol and benadryl in syringe and that she would be short of her med since we had discarded it. She was unhappy, agitated and wanted her syringe back.  The [facility], sara, intervened and went into pt's room to talk with pt. AC stayed in room until pt was calm. Pt reported to [facility] that this rn hurt her hand when syringe was taken. Pt did not complain of any pain or discomfort when syringe was taken. She did not hesitate to let go off the syringe when taken and no force was used to take syringe. No sign of injury was noted during and after.  Pt was given discharge information and she voiced understanding. She signed document with no problems. Remained on unit until 1440 when ride arrived.   Pt Called this rn to her room prior to leaving unit and asked for a hug and apologized for her behavior earlier. States, ' i don't know what i would have done if i was the nurse seeing a pt holding a needle in their hand".  Left unit in wheelchair pushed by nurse tech. Left in good condition.

## 2011-12-18 NOTE — Progress Notes (Signed)
Patient's blood pressure is beginning to trend up closer to normal range. Patient has been made aware that she will receive iv pain as ordered granted her blood pressure is within a safe range. Although not very pleased, she verbalizes understanding.

## 2011-12-18 NOTE — Care Management Note (Signed)
    Page 1 of 1   12/18/2011     4:53:35 PM   CARE MANAGEMENT NOTE 12/18/2011  Patient:  [name] Carr, [name] Carr   Account Number:  [id number]  Date Initiated:  12/18/2011  Documentation initiated by:  Roseanne Reno  Subjective/Objective Assessment:   Severe headache.    Hx of breast cancer w/ metastatic disease.     Action/Plan:   Home w/ HHC.   Anticipated DC Date:  12/18/2011   Anticipated DC Plan:  HOME W HOME HEALTH SERVICES         [facility] Choice  HOME HEALTH   Choice offered to / List presented to:          [facility] arranged  HH-1 RN  IV Antibiotics      HH agency  [facility]   Status of service:  Completed, signed off  Discharge Disposition:  HOME W HOME HEALTH SERVICES  Comments:  12/18/11  1200p  [facility] Spoke w/ pt and she is active w/ [facility] for IV abx's.  Pt does have a PICC line.  Pt very agitated during our conversation.  Difficult to obtain information from her. CM called and spoke w/ Lupita Leash at [place] and after some research, Lupita Leash noted that pt was active w/ [facility] in [place].  Lupita Leash will notifiy that branch of her hospitalization and dc today w/ need for resumption of HHC today.  Referral faxed to Lupita Leash at [place].  Pt also needs extenisons for her PICC line so that she (the pt) will be able to do her abx this evening.  Lupita Leash notified of this need.  CM available to assist as needed.  TJohnson, RNBSN [phone]

## 2011-12-18 NOTE — Discharge Summary (Signed)
Physician Discharge Summary  Patient ID: [name] Carr MRN: [id number] DOB/AGE: Dec 01, 1955 56 y.o.  Admit date: 12/16/2011 Discharge date: 12/18/2011  Primary Care Physician:  Rene Paci, MD  Oncologist: Dr. Cyndie Chime   Discharge Diagnoses:    Principal Problem:  *Hyponatremia Active Problems:  Breats Ca, 08/2009 - hx liver mets, resolved s/p chemo  DIABETES MELLITUS, TYPE II  HYPERLIPIDEMIA  MIGRAINE HEADACHE  Normocytic anemia  SLE-on prednisone-Follow at [facility]      Medication List     As of 12/18/2011 10:14 AM    STOP taking these medications         furosemide 20 MG tablet   Commonly known as: LASIX      hydrochlorothiazide 25 MG tablet   Commonly known as: HYDRODIURIL      potassium chloride SA 20 MEQ tablet   Commonly known as: K-DUR,KLOR-CON      promethazine 25 MG tablet   Commonly known as: PHENERGAN      spironolactone-hydrochlorothiazide 25-25 MG per tablet   Commonly known as: ALDACTAZIDE      sulfamethoxazole-trimethoprim 800-160 MG per tablet   Commonly known as: BACTRIM DS,SEPTRA DS      TAKE these medications         carisoprodol 350 MG tablet   Commonly known as: SOMA   Take 350 mg by mouth 4 (four) times daily as needed. Muscle pain      CEFTAROLINE FOSAMIL IV   Inject 600 mg into the vein 2 (two) times daily. [facility]      clonazePAM 1 MG tablet   Commonly known as: KLONOPIN   TAKE 1 TABLET BY MOUTH TWICE DAILY AS NEEDED      docusate sodium 100 MG capsule   Commonly known as: COLACE   Take 200 mg by mouth 2 (two) times daily.      DULoxetine 60 MG capsule   Commonly known as: CYMBALTA   Take 1 capsule (60 mg total) by mouth 2 (two) times daily.      EPINEPHrine 0.3 mg/0.3 mL Devi   Commonly known as: EPI-PEN   Inject 0.3 mg into the muscle once as needed. For bee stings.      esomeprazole 40 MG capsule   Commonly known as: NEXIUM   Take 40 mg by mouth daily before breakfast.      gabapentin 300 MG  capsule   Commonly known as: NEURONTIN   TAKE 3 CAPSULES BY MOUTH THREE TIMES DAILY      HYDROcodone-acetaminophen 10-325 MG per tablet   Commonly known as: NORCO   Take 1-2 tablets by mouth every 8 (eight) hours as needed for pain. DO NOT FILL THIS PRESCRIPTION UNTIL 12/12/11  Take 1-2 tabs po Q 8 hours prn pain.  This is a 3 week supply  Pt should ONLY get narcotics from Dr Cyndie Chime or Lonna Cobb, NP. [facility])      HYDROmorphone 2 MG tablet   Commonly known as: DILAUDID   Take 1 tablet (2 mg total) by mouth every 4 (four) hours as needed for pain.      ondansetron 4 MG tablet   Commonly known as: ZOFRAN   Take 4 mg by mouth every 4 (four) hours as needed. Nausea.      ondansetron 8 MG disintegrating tablet   Commonly known as: ZOFRAN-ODT   Take 8 mg by mouth every 4 (four) hours as needed. Nausea.      predniSONE 10 MG tablet  Commonly known as: DELTASONE   Take 10 mg by mouth daily.      PROBIOTIC FORMULA Caps   Take 1 capsule by mouth daily.      promethazine 25 MG suppository   Commonly known as: PHENERGAN   Place 25 mg rectally every 4 (four) hours as needed. Nausea.      rOPINIRole 1 MG tablet   Commonly known as: REQUIP   Take 1 tablet (1 mg total) by mouth at bedtime.      traZODone 150 MG tablet   Commonly known as: DESYREL   Take 0.5-1 tablets (75-150 mg total) by mouth at bedtime.         Disposition and Follow-up:  Will be discharged home today in stable and improved condition. Has been advised to follow up with her PCP in 2 weeks and with her neurologist for further treatment of her migraine headaches. No narcotics will be prescribed as there is a note in her chart that only Dr. Cyndie Chime is to provide her narcotics.  Consults:  None   Significant Diagnostic Studies:  Dg Chest 2 View  12/16/2011  *RADIOLOGY REPORT*  Clinical Data: Intermittent chest pain and cough; history of diabetes and hepatic metastases.  CHEST - 2 VIEW   Comparison: Chest radiograph performed 12/23/2010, and CT of the chest performed 05/20/2011  Findings: The lungs are well-aerated and clear.  There is no evidence of focal opacification, pleural effusion or pneumothorax.  The heart is normal in size; the mediastinal contour is within normal limits.  No acute osseous abnormalities are seen.  The patient's right PICC is noted ending about the right brachiocephalic vein, near the SVC.  IMPRESSION: No acute cardiopulmonary process seen.   Original Report Authenticated By: Tonia Ghent, M.D.    Ct Head Wo Contrast  12/16/2011  *RADIOLOGY REPORT*  Clinical Data: Persistent headache  CT HEAD WITHOUT CONTRAST  Technique:  Contiguous axial images were obtained from the base of the skull through the vertex without contrast.  Comparison: 03/19/2011  Findings:  The gray-white differentiation is maintained.  No CT evidence of acute large territory infarct.  Bilateral basal ganglial calcifications.  No intraparenchymal or extra-axial mass or hemorrhage.  Normal size and configuration of the ventricles and basilar cisterns.  No midline shift.  Limited visualization of paranasal sinuses and mastoid air cells are normal.  Regional soft tissues are normal.  No displaced calvarial fracture.  IMPRESSION: Negative noncontrast head CT.   Original Report Authenticated By: Tacey Ruiz, MD     Brief H and P: For complete details please refer to admission H and P, but in brief patient is a 56 y.o. female who presents to the ED with complaints of a severe 10/10 Migraine Headache for the past 4 days. She reports not being able to sleep for the past 72 hours due to the pain. She has multiple medical problems and in the ED she was found to have a sodium level of 126. She was medicated for her headache pain and sent for a CT scan of the brain due to her symptoms and history of breast cancer with treated metastatic disease. The CT scan of the brain was negative for evidence of metastatic  disease at this time. She was referred for medical admission due to her hyponatremia.      Hospital Course:  Principal Problem:  *Hyponatremia Active Problems:  Breats Ca, 08/2009 - hx liver mets, resolved s/p chemo  DIABETES MELLITUS, TYPE II  HYPERLIPIDEMIA  MIGRAINE  HEADACHE  Normocytic anemia  SLE-on prednisone-Follow at [facility]    Hyponatremia -Na has improved to 133 from 126 on admission with IVF. -I have elected to discontinue her HCTZ and Lasix for now. -Has been advised to procure a follow up appointment with her PCP in 2 weeks at which time a BMET should be done to follow on her Na levels.  Hypokalemia -Also likely related to her diuretic use. -Will give 40 meq prior to DC home today. -Will not send on KCl supplements as we have discontinued her diuretics.  Migraine Headache -Patient still complains of a "terrible headache". -This is a chronic issue for her. -CT scan that was negative is reassuring. -She exhibits pain-seeking tendencies asking for a particular high dose of dilaudid in combination with other potent narcotics. -She was given 4 mg of Dilaudid in the ED and was extremely sedated for a full 12 hours upon arrival to the floor. She would not even rouse to a sternal rub. She is adamant that we repeat that same dose to "break the cycle" of the HA. Have explained that that will not be possible. -There is a note in her chart that states that only Dr. Cyndie Chime is to prescribe her narcotics. -She states that the Vicodin #120 tablets that was prescribed 1 week ago has already run out. -She has been observed on multiple occasions from the doorway by myself and other hospital staff and she has been jovial, talking on the phone and watching TV, and as we enter the room she grabs her head and complains of pain. -If she were to return to the ED, I would be vigilant of further narcotic prescription and admission for pain.   Time spent on Discharge: Greater than 30  minutes.  SignedChaya Jan [facility] Pager: [phone] 12/18/2011, 10:14 AM

## 2011-12-18 NOTE — Progress Notes (Signed)
[name] Carr has been sound asleep since 0130. She continues to be hypotensive most of the time. RN will continue to assess blood pressure prior to administering medications that may potentially decrease blood pressure further.

## 2011-12-19 ENCOUNTER — Telehealth: Payer: Self-pay | Admitting: *Deleted

## 2011-12-19 LAB — GLUCOSE, CAPILLARY: Glucose-Capillary: 126 mg/dL — ABNORMAL HIGH (ref 70–99)

## 2011-12-19 NOTE — Telephone Encounter (Signed)
Notified Terra gave md response. Pt has appt with md on 12/21/11...lmb

## 2011-12-19 NOTE — Telephone Encounter (Signed)
If HH unable to provide IV antibiotics as ordered by hopsitalist, they need to notify her ordering doc as I have not seen pt since her DC from hospital - or instruct pt to return to hospital for IV abx as ordered

## 2011-12-19 NOTE — Telephone Encounter (Signed)
Miss and closes previous phone note. Received call back from [place] wanted to inform md that they had received a referral from hospitalist @ [facility] Dr. Lavetta Nielsen. Pt needed a pic-line antibiotic. Started seeing pt 12/13/11. Saw her all last week, but over the week pt didn't notify them she came to  hosp and was admitted for migraine. Incident happen while pt was there she tried to inject herself with something & per hosp notes she has use 120 vicodin in 1 week. Pt still has pic-line in and they are not able to take pt back for care due to her behavior...Ivory Broad

## 2011-12-19 NOTE — Telephone Encounter (Signed)
Left msg on vm stating requesting to speak with nurse or md concerning pt. Having some issues with pt. Called Terra back had to leave msg on vm to RTC...[name]

## 2011-12-19 NOTE — Telephone Encounter (Signed)
Received call back from [place] wanted to inform md that they had received referral from hospitalist @ [facility] Dr. Lavetta Nielsen. Pt needed pic-line for IV antibiotic. Started seeing pt 12/13/11. Saw all of last week, but over  The weekend pt didn't notify them she came to [facility] long and was admitted for migraine. Incident happen while pt was there she tried to inject herself with something & per hospital note pt has use 120 vicodin in 1 week. Pt still has pic-line in and they are not able to take pt back on for care due to her behavior...[name]

## 2011-12-21 ENCOUNTER — Ambulatory Visit: Payer: Medicare Other | Admitting: Internal Medicine

## 2011-12-21 ENCOUNTER — Telehealth: Payer: Self-pay | Admitting: *Deleted

## 2011-12-21 MED ORDER — PROMETHAZINE HCL 25 MG PO TABS: 25.0000 mg | ORAL_TABLET | Freq: Four times a day (QID) | ORAL | Status: DC | PRN

## 2011-12-21 NOTE — Telephone Encounter (Signed)
01/16/12 OV ok - call sooner if problems

## 2011-12-21 NOTE — Telephone Encounter (Signed)
Pt left on vm didn't know she had an apt this afternoon so she cancel appt. Currently under Dr. Valla Leaver care and he removed the pic-line. Have her normal f/u appt for 01/16/12. Want to know if md want to see her sooner...[name] Carr

## 2011-12-21 NOTE — Telephone Encounter (Signed)
Notified pt with md response. Pt states she is needing refill on her phenergan. Requesting to go to [facility].Marland KitchenRaechel Chute

## 2011-12-22 ENCOUNTER — Other Ambulatory Visit: Payer: Self-pay | Admitting: Internal Medicine

## 2011-12-26 ENCOUNTER — Ambulatory Visit (HOSPITAL_BASED_OUTPATIENT_CLINIC_OR_DEPARTMENT_OTHER): Payer: Medicare Other | Admitting: Oncology

## 2011-12-26 ENCOUNTER — Other Ambulatory Visit (HOSPITAL_BASED_OUTPATIENT_CLINIC_OR_DEPARTMENT_OTHER): Payer: Medicare Other | Admitting: Lab

## 2011-12-26 ENCOUNTER — Telehealth: Payer: Self-pay | Admitting: Oncology

## 2011-12-26 VITALS — BP 115/68 | HR 84 | Temp 98.2°F | Resp 20 | Ht 64.0 in | Wt 176.2 lb

## 2011-12-26 DIAGNOSIS — F1920 Other psychoactive substance dependence, uncomplicated: Secondary | ICD-10-CM

## 2011-12-26 DIAGNOSIS — C787 Secondary malignant neoplasm of liver and intrahepatic bile duct: Secondary | ICD-10-CM

## 2011-12-26 DIAGNOSIS — G894 Chronic pain syndrome: Secondary | ICD-10-CM

## 2011-12-26 DIAGNOSIS — C50919 Malignant neoplasm of unspecified site of unspecified female breast: Secondary | ICD-10-CM

## 2011-12-26 LAB — CBC WITH DIFFERENTIAL/PLATELET
BASO%: 0.4 % (ref 0.0–2.0)
Basophils Absolute: 0 10*3/uL (ref 0.0–0.1)
EOS%: 0.4 % (ref 0.0–7.0)
HCT: 30.2 % — ABNORMAL LOW (ref 34.8–46.6)
HGB: 9.9 g/dL — ABNORMAL LOW (ref 11.6–15.9)
LYMPH%: 4.7 % — ABNORMAL LOW (ref 14.0–49.7)
MCH: 25.9 pg (ref 25.1–34.0)
MCHC: 32.7 g/dL (ref 31.5–36.0)
MCV: 79.3 fL — ABNORMAL LOW (ref 79.5–101.0)
MONO%: 6 % (ref 0.0–14.0)
NEUT%: 88.5 % — ABNORMAL HIGH (ref 38.4–76.8)

## 2011-12-26 LAB — COMPREHENSIVE METABOLIC PANEL (CC13)
ALT: 14 U/L (ref 0–55)
AST: 17 U/L (ref 5–34)
Alkaline Phosphatase: 85 U/L (ref 40–150)
BUN: 22 mg/dL (ref 7.0–26.0)
Calcium: 9.4 mg/dL (ref 8.4–10.4)
Creatinine: 1.3 mg/dL — ABNORMAL HIGH (ref 0.6–1.1)
Total Bilirubin: 0.28 mg/dL (ref 0.20–1.20)

## 2011-12-26 NOTE — Telephone Encounter (Signed)
Gave pt appt for January 2014 lab and MD °

## 2011-12-27 ENCOUNTER — Telehealth: Payer: Self-pay | Admitting: *Deleted

## 2011-12-27 NOTE — Progress Notes (Signed)
Hematology and Oncology Follow Up Visit  [name] Carr [id number] 1955/04/14 56 y.o. 12/27/2011 11:16 AM   Principle Diagnosis: Encounter Diagnosis  Name Primary?  . Breast cancer metastasized to liver Yes     Interim History:   Followup visit for this pleasant but difficult 56 year old woman transferred to my oncology care in August of 2012 when she was dismissed from her oncologist in [facility] secondary to drug-seeking behavior she was diagnosed with a stage IIIA,Triple negative , 5 cm primary, cancer of the right breast, metastatic to liver at diagnosis in July 2011.  Excellent response to neoadjuvant chemotherapy initially with Cytoxan and Taxotere x3 then adjuvant Abraxane 3 weeks on, 1 week off through 04/27/2010 when the patient elected to stop all treatment.  She achieved an amazing complete and durable response. I initially put her on oral Xeloda  which was poorly tolerated and stopped after only about one month.  Most recent restaging evaluation was done in April of this year including CT scans and a PET scan which was negative for any obvious disease.  She underwent bilateral simple  mastectomies in [place] on 11/19/2010. She had a number of perioperative complications including bilateral chest wall cellulitis with blood cultures positive for Staphylococcus, recurrent sepsis when antibiotics were stopped with subsequent re-admission to the hospital and recommendation to remove her Port-A-Cath infusion device, and an episode of profuse epistaxis  while she was in the hospital on anticoagulation for an internal jugular vein thrombosis..  She has chronic ongoing issues with lupus. She is on chronic steroids currently prednisone 10 mg daily. She has not seen a rheumatologist in over 2 years. Lupus causes polyarthralgia and polymyalgia.  She has a chronic pain syndrome related to lupus and 2 chronic back pain.  I have now been manipulated by her into prescribing her pain  medications. We have a contract and she understands that she will only get a limited number of Vicodin tablets per month.  Since her last visit here in May, she was hospitalized again in [facility] recently when she developed cellulitis of her face initially treated with outpatient antibiotics but then admitted due to what she describes as increasing constitutional symptoms. Records not available at time of this dictation.  Her main complaint continues to be debilitating total body pain.     Medications: reviewed  Allergies:  Allergies  Allergen Reactions  . Methotrexate Anaphylaxis  . Midazolam Hcl Anaphylaxis  . Ammonia Other (See Comments)    Ended up on ventilator from ammonia tabs  . Cephalexin Nausea And Vomiting and Other (See Comments)    Blisters.  . Contrast Media (Iodinated Diagnostic Agents) Other (See Comments)    Doesn't breath well.  . Infliximab Nausea And Vomiting  . Iohexol Other (See Comments)    SOB   . Ketorolac Tromethamine Other (See Comments)    Injectable doesn't work and pill hurts stomach.  . Nalbuphine Other (See Comments)    Unknown.  . Nsaids Nausea And Vomiting  . Ultram (Tramadol Hcl)   . Celecoxib Rash    Review of Systems: Constitutional:   Chronic pain and fatigue Respiratory: No cough or dyspnea Cardiovascular: No ischemic type chest pain or palpitations  Gastrointestinal: No abdominal pain or change in bowel habit Genito-Urinary: No urinary tract symptoms Musculoskeletal: See above Neurologic: Recent headache evaluated with a CT of the brain (noncontrast) done on November 8 which showed no obvious metastatic disease. Skin: See above Remaining ROS negative.  Physical Exam: Blood pressure 115/68,  pulse 84, temperature 98.2 F (36.8 C), temperature source Oral, resp. rate 20, height 5\' 4"  (1.626 m), weight 176 lb 3.2 oz (79.924 kg). Wt Readings from Last 3 Encounters:  12/26/11 176 lb 3.2 oz (79.924 kg)  12/17/11 175 lb (79.379 kg)    10/04/11 183 lb 9.6 oz (83.28 kg)     General appearance: A well-nourished Caucasian woman HENNT: Pharynx no erythema exudate or ulcer. There is residual erythema in the malar area of her face left greater than right Lymph nodes: No cervical, supraclavicular, or axillary adenopathy Breasts: Bilateral mastectomies, no chest wall lesions Lungs: Clear to auscultation, resonant to percussion Heart: Regular rhythm, faint 1/6 murmur left sternal border murmur, no gallop Abdomen: Soft, nontender, no mass, no organomegaly Extremities: No edema, no calf tenderness. There is erythema fairly intense of her hands and feet Vascular: No cyanosis Neurologic: Motor strength 5 over 5, reflexes 2+ symmetric Skin: Erythema of her face and extremities  Lab Results: Lab Results  Component Value Date   WBC 10.1 12/26/2011   HGB 9.9* 12/26/2011   HCT 30.2* 12/26/2011   MCV 79.3* 12/26/2011   PLT 254 12/26/2011     Chemistry      Component Value Date/Time   NA 135* 12/26/2011 1455   NA 133* 12/17/2011 0955   K 3.3* 12/26/2011 1455   K 3.0* 12/17/2011 0955   CL 96* 12/26/2011 1455   CL 93* 12/17/2011 0955   CO2 31* 12/26/2011 1455   CO2 29 12/17/2011 0955   BUN 22.0 12/26/2011 1455   BUN 14 12/17/2011 0955   CREATININE 1.3* 12/26/2011 1455   CREATININE 0.97 12/17/2011 0955      Component Value Date/Time   CALCIUM 9.4 12/26/2011 1455   CALCIUM 9.0 12/17/2011 0955   ALKPHOS 85 12/26/2011 1455   ALKPHOS 76 11/21/2011 1915   AST 17 12/26/2011 1455   AST 26 11/21/2011 1915   ALT 14 12/26/2011 1455   ALT 18 11/21/2011 1915   BILITOT 0.28 12/26/2011 1455   BILITOT 0.3 11/21/2011 1915    CA 2729 tumor marker remains in the normal range at 33 units normal less than 39   Radiological Studies: Dg Chest 2 View  12/16/2011  *RADIOLOGY REPORT*  Clinical Data: Intermittent chest pain and cough; history of diabetes and hepatic metastases.  CHEST - 2 VIEW  Comparison: Chest radiograph performed 12/23/2010,  and CT of the chest performed 05/20/2011  Findings: The lungs are well-aerated and clear.  There is no evidence of focal opacification, pleural effusion or pneumothorax.  The heart is normal in size; the mediastinal contour is within normal limits.  No acute osseous abnormalities are seen.  The patient's right PICC is noted ending about the right brachiocephalic vein, near the SVC.  IMPRESSION: No acute cardiopulmonary process seen.   Original Report Authenticated By: Tonia Ghent, M.D.    Ct Head Wo Contrast  12/16/2011  *RADIOLOGY REPORT*  Clinical Data: Persistent headache  CT HEAD WITHOUT CONTRAST  Technique:  Contiguous axial images were obtained from the base of the skull through the vertex without contrast.  Comparison: 03/19/2011  Findings:  The gray-white differentiation is maintained.  No CT evidence of acute large territory infarct.  Bilateral basal ganglial calcifications.  No intraparenchymal or extra-axial mass or hemorrhage.  Normal size and configuration of the ventricles and basilar cisterns.  No midline shift.  Limited visualization of paranasal sinuses and mastoid air cells are normal.  Regional soft tissues are normal.  No displaced calvarial fracture.  IMPRESSION: Negative noncontrast head CT.   Original Report Authenticated By: Tacey Ruiz, MD     Impression and Plan: #1. Triple negative cancer of the right breast with low volume but the biopsy-proven liver involvement at diagnosis treated as outlined above. She remains free of any obvious active disease at this time off all chemotherapy treatment since March of 2012. Status post bilateral elective simple mastectomies. I will obtain a CT scan of her abdomen and pelvis at this time to reevaluate her liver. Recent chest x-ray and CT brain were negative. Current liver functions are normal. Given her complicated medical history, I will not put her back on any chemotherapy unless and until there are objective changes on the scan.  #2.  Recent cellulitis of the face treated with parenteral antibiotics  #3. Bilateral chest wall cellulitis following her mastectomies last year.  #4. Right shoulder abscess requiring incision and drainage January 2013  #5. Systemic lupus. She appears to be significantly immunocompromised which is the likely reason for her recurrent cutaneous infections. It would be worthwhile to check serum immunoglobulin levels.  #6. 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 Chronic pain syndrome  #8. Narcotic dependence. She promises that she will try to get back into a pain clinic. I reluctantly agreed to add a long-acting narcotic, OxyContin 40 mg every 12 hours to her short acting oxycodone/Tylenol 10/325 until she can enroll in a local pain clinic.   CC:. Dr. Rene Paci Dr. Jaclynn Guarneri; Dr. Paulette Blanch [place], MD 11/19/201311:16 AM

## 2011-12-27 NOTE — Telephone Encounter (Signed)
Pt notified of CA 27.29 result result & pt request copy of labs.  These will be mailed to her.

## 2011-12-27 NOTE — Telephone Encounter (Signed)
Message copied by Sabino Snipes on Tue Dec 27, 2011 12:01 PM ------      Message from: Levert Feinstein      Created: Tue Dec 27, 2011 11:10 AM       Call pt Ca 27.29 remains in normal range

## 2011-12-28 ENCOUNTER — Other Ambulatory Visit: Payer: Self-pay | Admitting: *Deleted

## 2011-12-28 MED ORDER — CARISOPRODOL 350 MG PO TABS: 350.0000 mg | ORAL_TABLET | Freq: Four times a day (QID) | ORAL | Status: DC | PRN

## 2011-12-28 NOTE — Telephone Encounter (Signed)
Faxed script back to [facility].../lmb 

## 2011-12-29 ENCOUNTER — Ambulatory Visit (HOSPITAL_COMMUNITY): Admission: RE | Admit: 2011-12-29 | Payer: Medicare Other | Source: Ambulatory Visit

## 2011-12-29 ENCOUNTER — Telehealth: Payer: Self-pay | Admitting: *Deleted

## 2011-12-29 NOTE — Telephone Encounter (Signed)
Pt left vm stating that she just woke up & she is in H[place]P[facility] & was supposed to be in [place] for CT liver.  She reports that she is really sick with a cold & temp is up & she has called to cancel scan but hasn't rescheduled but will when discussed with her ride.  She states I didn't want Dr Cyndie Chime to think I was being non-compliant.

## 2012-01-03 ENCOUNTER — Other Ambulatory Visit: Payer: Self-pay | Admitting: Internal Medicine

## 2012-01-04 ENCOUNTER — Other Ambulatory Visit: Payer: Self-pay | Admitting: *Deleted

## 2012-01-04 MED ORDER — ROPINIROLE HCL 1 MG PO TABS: 1.0000 mg | ORAL_TABLET | Freq: Every day | ORAL | Status: DC

## 2012-01-04 MED ORDER — POTASSIUM CHLORIDE CRYS ER 20 MEQ PO TBCR: 20.0000 meq | EXTENDED_RELEASE_TABLET | Freq: Two times a day (BID) | ORAL | Status: DC

## 2012-01-10 ENCOUNTER — Observation Stay (HOSPITAL_COMMUNITY)
Admission: EM | Admit: 2012-01-10 | Discharge: 2012-01-12 | Disposition: A | Discharge status: Home / Self Care | Payer: Medicare Other | Attending: Internal Medicine | Admitting: Internal Medicine

## 2012-01-10 ENCOUNTER — Encounter (HOSPITAL_COMMUNITY): Payer: Self-pay | Admitting: Emergency Medicine

## 2012-01-10 DIAGNOSIS — K121 Other forms of stomatitis: Secondary | ICD-10-CM | POA: Diagnosis present

## 2012-01-10 DIAGNOSIS — IMO0002 Reserved for concepts with insufficient information to code with codable children: Secondary | ICD-10-CM

## 2012-01-10 DIAGNOSIS — L02419 Cutaneous abscess of limb, unspecified: Secondary | ICD-10-CM

## 2012-01-10 DIAGNOSIS — I82C19 Acute embolism and thrombosis of unspecified internal jugular vein: Secondary | ICD-10-CM

## 2012-01-10 DIAGNOSIS — L0390 Cellulitis, unspecified: Secondary | ICD-10-CM

## 2012-01-10 DIAGNOSIS — T8149XA Infection following a procedure, other surgical site, initial encounter: Secondary | ICD-10-CM

## 2012-01-10 DIAGNOSIS — Z765 Malingerer [conscious simulation]: Secondary | ICD-10-CM | POA: Insufficient documentation

## 2012-01-10 DIAGNOSIS — M545 Low back pain, unspecified: Secondary | ICD-10-CM | POA: Insufficient documentation

## 2012-01-10 DIAGNOSIS — F609 Personality disorder, unspecified: Secondary | ICD-10-CM | POA: Diagnosis present

## 2012-01-10 DIAGNOSIS — Z9181 History of falling: Secondary | ICD-10-CM | POA: Insufficient documentation

## 2012-01-10 DIAGNOSIS — C50919 Malignant neoplasm of unspecified site of unspecified female breast: Secondary | ICD-10-CM | POA: Diagnosis present

## 2012-01-10 DIAGNOSIS — G43909 Migraine, unspecified, not intractable, without status migrainosus: Secondary | ICD-10-CM

## 2012-01-10 DIAGNOSIS — F339 Major depressive disorder, recurrent, unspecified: Secondary | ICD-10-CM | POA: Diagnosis present

## 2012-01-10 DIAGNOSIS — E039 Hypothyroidism, unspecified: Secondary | ICD-10-CM | POA: Insufficient documentation

## 2012-01-10 DIAGNOSIS — E873 Alkalosis: Secondary | ICD-10-CM | POA: Insufficient documentation

## 2012-01-10 DIAGNOSIS — I82409 Acute embolism and thrombosis of unspecified deep veins of unspecified lower extremity: Secondary | ICD-10-CM | POA: Diagnosis present

## 2012-01-10 DIAGNOSIS — D509 Iron deficiency anemia, unspecified: Secondary | ICD-10-CM | POA: Diagnosis present

## 2012-01-10 DIAGNOSIS — K219 Gastro-esophageal reflux disease without esophagitis: Secondary | ICD-10-CM

## 2012-01-10 DIAGNOSIS — M549 Dorsalgia, unspecified: Secondary | ICD-10-CM

## 2012-01-10 DIAGNOSIS — C787 Secondary malignant neoplasm of liver and intrahepatic bile duct: Secondary | ICD-10-CM | POA: Insufficient documentation

## 2012-01-10 DIAGNOSIS — R55 Syncope and collapse: Principal | ICD-10-CM

## 2012-01-10 DIAGNOSIS — M329 Systemic lupus erythematosus, unspecified: Secondary | ICD-10-CM

## 2012-01-10 DIAGNOSIS — E119 Type 2 diabetes mellitus without complications: Secondary | ICD-10-CM | POA: Diagnosis present

## 2012-01-10 DIAGNOSIS — F329 Major depressive disorder, single episode, unspecified: Secondary | ICD-10-CM

## 2012-01-10 DIAGNOSIS — Z79899 Other long term (current) drug therapy: Secondary | ICD-10-CM | POA: Insufficient documentation

## 2012-01-10 DIAGNOSIS — F1920 Other psychoactive substance dependence, uncomplicated: Secondary | ICD-10-CM | POA: Insufficient documentation

## 2012-01-10 DIAGNOSIS — Z9010 Acquired absence of unspecified breast and nipple: Secondary | ICD-10-CM | POA: Insufficient documentation

## 2012-01-10 DIAGNOSIS — J309 Allergic rhinitis, unspecified: Secondary | ICD-10-CM

## 2012-01-10 DIAGNOSIS — E876 Hypokalemia: Secondary | ICD-10-CM | POA: Diagnosis present

## 2012-01-10 DIAGNOSIS — E785 Hyperlipidemia, unspecified: Secondary | ICD-10-CM | POA: Insufficient documentation

## 2012-01-10 DIAGNOSIS — N259 Disorder resulting from impaired renal tubular function, unspecified: Secondary | ICD-10-CM

## 2012-01-10 DIAGNOSIS — R569 Unspecified convulsions: Secondary | ICD-10-CM

## 2012-01-10 DIAGNOSIS — G894 Chronic pain syndrome: Secondary | ICD-10-CM

## 2012-01-10 DIAGNOSIS — Z86718 Personal history of other venous thrombosis and embolism: Secondary | ICD-10-CM | POA: Insufficient documentation

## 2012-01-10 DIAGNOSIS — K1370 Unspecified lesions of oral mucosa: Secondary | ICD-10-CM | POA: Insufficient documentation

## 2012-01-10 DIAGNOSIS — E871 Hypo-osmolality and hyponatremia: Secondary | ICD-10-CM

## 2012-01-10 DIAGNOSIS — D649 Anemia, unspecified: Secondary | ICD-10-CM

## 2012-01-10 LAB — COMPREHENSIVE METABOLIC PANEL
ALT: 21 U/L (ref 0–35)
AST: 25 U/L (ref 0–37)
Albumin: 3.5 g/dL (ref 3.5–5.2)
Alkaline Phosphatase: 110 U/L (ref 39–117)
BUN: 15 mg/dL (ref 6–23)
CO2: 34 mEq/L — ABNORMAL HIGH (ref 19–32)
Calcium: 9.6 mg/dL (ref 8.4–10.5)
Chloride: 89 mEq/L — ABNORMAL LOW (ref 96–112)
Creatinine, Ser: 0.86 mg/dL (ref 0.50–1.10)
GFR calc Af Amer: 85 mL/min — ABNORMAL LOW (ref >90)
GFR calc non Af Amer: 74 mL/min — ABNORMAL LOW (ref >90)
Glucose, Bld: 131 mg/dL — ABNORMAL HIGH (ref 70–99)
Potassium: 2.6 mEq/L — CL (ref 3.5–5.1)
Sodium: 136 mEq/L (ref 135–145)
Total Bilirubin: 0.2 mg/dL — ABNORMAL LOW (ref 0.3–1.2)
Total Protein: 7 g/dL (ref 6.0–8.3)

## 2012-01-10 LAB — CBC WITH DIFFERENTIAL/PLATELET
Basophils Absolute: 0 10*3/uL (ref 0.0–0.1)
Basophils Relative: 0 % (ref 0–1)
Eosinophils Absolute: 0 10*3/uL (ref 0.0–0.7)
Eosinophils Relative: 0 % (ref 0–5)
HCT: 31.3 % — ABNORMAL LOW (ref 36.0–46.0)
Hemoglobin: 9.9 g/dL — ABNORMAL LOW (ref 12.0–15.0)
Lymphocytes Relative: 15 % (ref 12–46)
Lymphs Abs: 1.4 10*3/uL (ref 0.7–4.0)
MCH: 25.1 pg — ABNORMAL LOW (ref 26.0–34.0)
MCHC: 31.6 g/dL (ref 30.0–36.0)
MCV: 79.4 fL (ref 78.0–100.0)
Monocytes Absolute: 0.6 10*3/uL (ref 0.1–1.0)
Monocytes Relative: 7 % (ref 3–12)
Neutro Abs: 7 10*3/uL (ref 1.7–7.7)
Neutrophils Relative %: 77 % (ref 43–77)
Platelets: 398 10*3/uL (ref 150–400)
RBC: 3.94 MIL/uL (ref 3.87–5.11)
RDW: 16.1 % — ABNORMAL HIGH (ref 11.5–15.5)
WBC: 9.1 10*3/uL (ref 4.0–10.5)

## 2012-01-10 LAB — TROPONIN I: Troponin I: <0.3 ng/mL (ref <0.30)

## 2012-01-10 MED ORDER — POTASSIUM CHLORIDE CRYS ER 20 MEQ PO TBCR
80.0000 meq | EXTENDED_RELEASE_TABLET | Freq: Once | ORAL | Status: AC
  Administered 2012-01-10: 80 meq via ORAL
  Filled 2012-01-10: qty 4

## 2012-01-10 MED ORDER — HYDROMORPHONE HCL PF 1 MG/ML IJ SOLN
1.0000 mg | Freq: Once | INTRAMUSCULAR | Status: AC
  Administered 2012-01-10: 1 mg via INTRAVENOUS
  Filled 2012-01-10: qty 1

## 2012-01-10 MED ORDER — POTASSIUM CHLORIDE 10 MEQ/100ML IV SOLN: 10.0000 meq | Freq: Once | INTRAVENOUS | Status: DC

## 2012-01-10 MED ORDER — PROMETHAZINE HCL 25 MG/ML IJ SOLN
12.5000 mg | Freq: Once | INTRAMUSCULAR | Status: AC
  Administered 2012-01-10: 12.5 mg via INTRAVENOUS
  Filled 2012-01-10: qty 1

## 2012-01-10 MED ORDER — SODIUM CHLORIDE 0.9 % IV BOLUS (SEPSIS): 1000.0000 mL | Freq: Once | INTRAVENOUS | Status: DC

## 2012-01-10 NOTE — ED Notes (Signed)
Pt is a difficult stick, IV rn notified.

## 2012-01-10 NOTE — ED Notes (Signed)
Pt states that she has been "falling asleep standing up.  States that she wakes up on the way down.  States she has had low sodium and low potassium in the past.  Also c/o cramping in legs.

## 2012-01-10 NOTE — ED Notes (Signed)
Reassessment due to wait time. Pt now states she is dizzy, has fallen frequently and not felt right since she hit the back of her head a couple of weeks ago, States her heart has times when it feels "funny" EKG obtained, VS reassessed. Continue to monitor.

## 2012-01-10 NOTE — ED Notes (Signed)
MD at bedside. 

## 2012-01-10 NOTE — ED Provider Notes (Signed)
History    56 year old female presenting with what sounds like syncopal events. Patient states that for the past 3 weeks she has these episodes where she just finds herself on the ground. She's not sure how or why she ends up there. No preceding symptoms. These events have not been witnessed by anybody else. No incontinence. No tongue biting. Denies history of seizure. She hasn't noticed anything that seems to precipitate these. These events initially more intermittent but recently has he has been having 1 or 2 per day. Patient states that she has been sleep deprived recently. Difficulty sleeping at night. She feels tired during the day and has fallen asleep during day, but says this is different than the episodes she has been having. No CP or SOB. No fever or chills. No palpitations. Denies recent medication changes. Denies BRBPR or melena. No blood thinners. No urinary complaints.   CSN: [id number]  Arrival date & time 01/10/12  1904   First MD Initiated Contact with Patient 01/10/12 2059      Chief Complaint  Patient presents with  . Altered Mental Status    (Consider location/radiation/quality/duration/timing/severity/associated sxs/prior treatment) HPI  Past Medical History  Diagnosis Date  . PERSONALITY DISORDER   . Chronic pain syndrome     chronic narcotics, dependancy hx - [facility] for pain mgmt  . MIGRAINE HEADACHE   . Lyme disease   . ALLERGIC RHINITIS   . ANEMIA-IRON DEFICIENCY   . HYPERLIPIDEMIA   . GERD   . DIABETES MELLITUS, TYPE II   . DEPRESSION   . SLE (systemic lupus erythematosus)     [facility] - chronic pred  . Psoriasis   . Addison's disease   . Degenerative joint disease   . DDD (degenerative disc disease)   . Phlebitis after infusion     left ?calf; "S/P phenergan/demerol injection"  . Blood clot in vein 2011; 02/04/11    "right jugular vein;left jugular vein"  . Hypothyroidism   . SEIZURE DISORDER     "lots of seizures; due to lupus"  .  Anxiety   . Neuropathy     hands, feet, due to diabetes & back problem  . Surgical wound infection 02/15/2011    R shoulder and mastectomy site - related to PICC  . CARCINOMA, BREAST 08/2009 dx    R breast,  mets to liver - resolution s/p chemo, s/p B mastect  . Metastases to the liver     Past Surgical History  Procedure Date  . Liver bopsy 09/2009  . Port a cath placement 09/2009; 11/19/10  . Port-a-cath removal 04/2010    "due to staph & strept"  . Port-a-cath removal 12/23/2010    Procedure: REMOVAL PORT-A-CATH;  Surgeon: Valarie Merino, MD;  Location: [facility];  Service: General;  Laterality: Left;  . Breast surgery 09/2009    right breast biopsy  . Mastectomy 11/19/10    bilateral mastectomy   . I&d extremity ~ 2010    right hand; S/P "dog bite"  . Peripherally inserted central catheter insertion     Family History  Problem Relation Age of Onset  . Lung cancer Mother   . Lung cancer Father   . Arthritis Other   . Diabetes Other   . Hyperlipidemia Other     History  Substance Use Topics  . Smoking status: Never Smoker   . Smokeless tobacco: Never Used  . Alcohol Use: No    OB History    Grav Para Term  Preterm Abortions TAB SAB Ect Mult Living                  Review of Systems   Review of symptoms negative unless otherwise noted in HPI.   Allergies  Methotrexate; Midazolam hcl; Ammonia; Cephalexin; Contrast media; Infliximab; Iohexol; Ketorolac tromethamine; Nalbuphine; Nsaids; Ultram; and Celecoxib  Home Medications   Current Outpatient Rx  Name  Route  Sig  Dispense  Refill  . CARISOPRODOL 350 MG PO TABS   Oral   Take 1 tablet (350 mg total) by mouth 4 (four) times daily as needed. Muscle pain   60 tablet   1   . CLONAZEPAM 1 MG PO TABS      TAKE 1 TABLET BY MOUTH TWICE DAILY AS NEEDED   60 tablet   0   . DOCUSATE SODIUM 100 MG PO CAPS   Oral   Take 200 mg by mouth 2 (two) times daily.          . DULOXETINE HCL 60 MG PO CPEP   Oral    Take 1 capsule (60 mg total) by mouth 2 (two) times daily.   180 capsule   1   . EPINEPHRINE 0.3 MG/0.3ML IJ DEVI   Intramuscular   Inject 0.3 mg into the muscle once as needed. For bee stings.          . ESOMEPRAZOLE MAGNESIUM 40 MG PO CPDR   Oral   Take 40 mg by mouth daily before breakfast.         . FUROSEMIDE 20 MG PO TABS   Oral   Take 20 mg by mouth 2 (two) times daily.         Marland Kitchen GABAPENTIN 300 MG PO CAPS      TAKE 3 CAPSULES BY MOUTH THREE TIMES DAILY   810 capsule   0   . HYDROCODONE-ACETAMINOPHEN 10-325 MG PO TABS   Oral   Take 1-2 tablets by mouth every 8 (eight) hours as needed for pain. DO NOT FILL THIS PRESCRIPTION UNTIL 12/12/11 Take 1-2 tabs po Q 8 hours prn pain.  This is a 3 week supply Pt should ONLY get narcotics from Dr Cyndie Chime or Lonna Cobb, NP. [facility])   126 tablet   0   . ONDANSETRON 8 MG PO TBDP   Oral   Take 8 mg by mouth every 4 (four) hours as needed. Nausea.         Marland Kitchen POTASSIUM CHLORIDE CRYS ER 20 MEQ PO TBCR   Oral   Take 1 tablet (20 mEq total) by mouth 2 (two) times daily. Take 1 by mouth twice a day   60 tablet   5   . PREDNISONE 10 MG PO TABS   Oral   Take 10 mg by mouth daily.         Marland Kitchen PROBIOTIC FORMULA PO CAPS   Oral   Take 1 capsule by mouth daily.          Marland Kitchen PROMETHAZINE HCL 25 MG RE SUPP   Rectal   Place 25 mg rectally every 4 (four) hours as needed. Nausea.         Marland Kitchen PROMETHAZINE HCL 25 MG PO TABS   Oral   Take 1 tablet (25 mg total) by mouth every 6 (six) hours as needed for nausea.   30 tablet   0   . ROPINIROLE HCL 1 MG PO TABS   Oral   Take 1 tablet (  1 mg total) by mouth at bedtime.   30 tablet   2     BP 143/66  Pulse 83  Temp 98.7 F (37.1 C) (Oral)  Resp 16  SpO2 100%  Physical Exam  Nursing note and vitals reviewed. Constitutional: She appears well-developed and well-nourished. No distress.  HENT:  Head: Normocephalic and atraumatic.  Mouth/Throat:  Oropharynx is clear and moist.       No carotid bruit  Eyes: Conjunctivae normal are normal. Pupils are equal, round, and reactive to light. Right eye exhibits no discharge. Left eye exhibits no discharge.  Neck: Neck supple.  Cardiovascular: Normal rate, regular rhythm and normal heart sounds.  Exam reveals no gallop and no friction rub.   No murmur heard. Pulmonary/Chest: Effort normal and breath sounds normal. No respiratory distress.  Abdominal: Soft. She exhibits no distension. There is no tenderness.       No pulsatile mass  Musculoskeletal: She exhibits no edema and no tenderness.       Symmetric pitting LE edema  Neurological: She is alert.  Skin: Skin is warm and dry. She is not diaphoretic. No pallor.  Psychiatric: She has a normal mood and affect. Her behavior is normal. Thought content normal.    ED Course  Procedures (including critical care time)  Labs Reviewed  CBC WITH DIFFERENTIAL - Abnormal; Notable for the following:    Hemoglobin 9.9 (*)     HCT 31.3 (*)     MCH 25.1 (*)     RDW 16.1 (*)     All other components within normal limits  COMPREHENSIVE METABOLIC PANEL - Abnormal; Notable for the following:    Potassium 2.6 (*)     Chloride 89 (*)     CO2 34 (*)     Glucose, Bld 131 (*)     Total Bilirubin 0.2 (*)     GFR calc non Af Amer 74 (*)     GFR calc Af Amer 85 (*)     All other components within normal limits  COMPREHENSIVE METABOLIC PANEL - Abnormal; Notable for the following:    Potassium 3.1 (*)     Glucose, Bld 122 (*)     Albumin 3.1 (*)     GFR calc non Af Amer 80 (*)     All other components within normal limits  CBC - Abnormal; Notable for the following:    RBC 3.65 (*)     Hemoglobin 9.0 (*)     HCT 29.6 (*)     MCH 24.7 (*)     RDW 16.4 (*)     All other components within normal limits  GLUCOSE, CAPILLARY - Abnormal; Notable for the following:    Glucose-Capillary 125 (*)     All other components within normal limits  GLUCOSE,  CAPILLARY - Abnormal; Notable for the following:    Glucose-Capillary 106 (*)     All other components within normal limits  TROPONIN I  MRSA PCR SCREENING  LAB REPORT - SCANNED   No results found.  EKG:  Rhythm: normal sinus Vent. rate 83 BPM PR interval 132 ms QRS duration 100 ms QT/QTc 412/484 ms ST segments: Minimal ST depression laterally and inferiorly Comparison: ST changes similar to prior EKGs   1. Syncope   2. Hypokalemia       MDM  56yf with what sounds potentially like recurrent syncope.  Reports recent insomnia and says that has fallen asleep on couch and feels tired, but also these  other events where just finds herself on the ground and not quite sure why. EKG is stable from priors.  Pt with ECHO from 08/2009 which fairly unremarkable. Normal EF, no regional wall abnormalities, mild mitral regurg. Not consistent with seizure with no prior hx, no postictal state, no incontinence or tongue biting. No carotid bruit appreciated. Hx of adrenal insufficiency likely from chronic steroid use for lupus. Currently on low dose prednisone. Not hypotensive. No orthostatic symptoms. Anemia at baseline. Pt not greatest historian and somewhat histrionic, but these episodes could be syncopal events and given recurrent nature will admit for further eval.    11:08 PM Pt refusing 1mg  dilaudid because it's not enough. Requesting 3mg .  Being manipulative for meds. Looking back through prior records, seems to have hx of drug seeking behavior.         Raeford Razor, MD 01/13/12 Rickey Primus

## 2012-01-11 ENCOUNTER — Observation Stay (HOSPITAL_COMMUNITY): Payer: Medicare Other

## 2012-01-11 ENCOUNTER — Encounter (HOSPITAL_COMMUNITY): Payer: Self-pay | Admitting: *Deleted

## 2012-01-11 DIAGNOSIS — R6889 Other general symptoms and signs: Secondary | ICD-10-CM

## 2012-01-11 DIAGNOSIS — R55 Syncope and collapse: Secondary | ICD-10-CM

## 2012-01-11 LAB — COMPREHENSIVE METABOLIC PANEL
ALT: 18 U/L (ref 0–35)
AST: 21 U/L (ref 0–37)
CO2: 31 mEq/L (ref 19–32)
Chloride: 97 mEq/L (ref 96–112)
Creatinine, Ser: 0.8 mg/dL (ref 0.50–1.10)
GFR calc non Af Amer: 80 mL/min — ABNORMAL LOW (ref >90)
Glucose, Bld: 122 mg/dL — ABNORMAL HIGH (ref 70–99)
Total Bilirubin: 0.3 mg/dL (ref 0.3–1.2)

## 2012-01-11 LAB — MRSA PCR SCREENING: MRSA by PCR: NEGATIVE

## 2012-01-11 LAB — CBC
MCH: 24.7 pg — ABNORMAL LOW (ref 26.0–34.0)
MCHC: 30.4 g/dL (ref 30.0–36.0)
Platelets: 330 10*3/uL (ref 150–400)
RBC: 3.65 MIL/uL — ABNORMAL LOW (ref 3.87–5.11)

## 2012-01-11 MED ORDER — GABAPENTIN 300 MG PO CAPS
300.0000 mg | ORAL_CAPSULE | Freq: Three times a day (TID) | ORAL | Status: DC
  Administered 2012-01-11 – 2012-01-12 (×5): 300 mg via ORAL
  Filled 2012-01-11 (×6): qty 1

## 2012-01-11 MED ORDER — ONDANSETRON HCL 4 MG PO TABS: 4.0000 mg | ORAL_TABLET | Freq: Four times a day (QID) | ORAL | Status: DC | PRN

## 2012-01-11 MED ORDER — HYDROCODONE-ACETAMINOPHEN 10-325 MG PO TABS
1.0000 | ORAL_TABLET | Freq: Four times a day (QID) | ORAL | Status: DC | PRN
  Administered 2012-01-12: 1 via ORAL
  Filled 2012-01-11: qty 1

## 2012-01-11 MED ORDER — HEPARIN SODIUM (PORCINE) 5000 UNIT/ML IJ SOLN
5000.0000 [IU] | Freq: Three times a day (TID) | INTRAMUSCULAR | Status: DC
  Administered 2012-01-11 – 2012-01-12 (×5): 5000 [IU] via SUBCUTANEOUS
  Filled 2012-01-11 (×7): qty 1

## 2012-01-11 MED ORDER — HYDROMORPHONE HCL PF 1 MG/ML IJ SOLN
1.0000 mg | INTRAMUSCULAR | Status: DC | PRN
  Administered 2012-01-11 – 2012-01-12 (×2): 1 mg via INTRAVENOUS
  Filled 2012-01-11 (×2): qty 1

## 2012-01-11 MED ORDER — ONDANSETRON HCL 4 MG/2ML IJ SOLN: 4.0000 mg | Freq: Four times a day (QID) | INTRAMUSCULAR | Status: DC | PRN

## 2012-01-11 MED ORDER — DULOXETINE HCL 60 MG PO CPEP
60.0000 mg | ORAL_CAPSULE | Freq: Two times a day (BID) | ORAL | Status: DC
  Administered 2012-01-11 – 2012-01-12 (×3): 60 mg via ORAL
  Filled 2012-01-11 (×4): qty 1

## 2012-01-11 MED ORDER — HYDROMORPHONE HCL PF 2 MG/ML IJ SOLN
2.0000 mg | Freq: Once | INTRAMUSCULAR | Status: AC
  Administered 2012-01-11: 2 mg via INTRAVENOUS
  Filled 2012-01-11: qty 1

## 2012-01-11 MED ORDER — OXYCODONE HCL ER 40 MG PO T12A
40.0000 mg | EXTENDED_RELEASE_TABLET | Freq: Two times a day (BID) | ORAL | Status: DC
  Administered 2012-01-11 (×2): 40 mg via ORAL
  Filled 2012-01-11 (×2): qty 1

## 2012-01-11 MED ORDER — POTASSIUM CHLORIDE CRYS ER 20 MEQ PO TBCR
20.0000 meq | EXTENDED_RELEASE_TABLET | Freq: Two times a day (BID) | ORAL | Status: DC
  Administered 2012-01-11 – 2012-01-12 (×4): 20 meq via ORAL
  Filled 2012-01-11 (×5): qty 1

## 2012-01-11 MED ORDER — ONDANSETRON 8 MG PO TBDP
8.0000 mg | ORAL_TABLET | Freq: Once | ORAL | Status: AC
  Administered 2012-01-11: 8 mg via ORAL
  Filled 2012-01-11: qty 1

## 2012-01-11 MED ORDER — CLONAZEPAM 1 MG PO TABS
1.0000 mg | ORAL_TABLET | Freq: Two times a day (BID) | ORAL | Status: DC
  Administered 2012-01-11 – 2012-01-12 (×4): 1 mg via ORAL
  Filled 2012-01-11 (×4): qty 1

## 2012-01-11 MED ORDER — BISACODYL 10 MG RE SUPP: 10.0000 mg | Freq: Every day | RECTAL | Status: DC | PRN

## 2012-01-11 MED ORDER — PREDNISONE 10 MG PO TABS
10.0000 mg | ORAL_TABLET | Freq: Every day | ORAL | Status: DC
  Administered 2012-01-11 – 2012-01-12 (×2): 10 mg via ORAL
  Filled 2012-01-11 (×2): qty 1

## 2012-01-11 MED ORDER — ROPINIROLE HCL 1 MG PO TABS
1.0000 mg | ORAL_TABLET | Freq: Every day | ORAL | Status: DC
  Administered 2012-01-11: 1 mg via ORAL
  Filled 2012-01-11 (×2): qty 1

## 2012-01-11 MED ORDER — OXYCODONE HCL 40 MG PO TB12: 40.0000 mg | ORAL_TABLET | Freq: Two times a day (BID) | ORAL | Status: DC

## 2012-01-11 MED ORDER — DOCUSATE SODIUM 100 MG PO CAPS
100.0000 mg | ORAL_CAPSULE | Freq: Two times a day (BID) | ORAL | Status: DC
  Administered 2012-01-11 – 2012-01-12 (×2): 100 mg via ORAL
  Filled 2012-01-11 (×5): qty 1

## 2012-01-11 MED ORDER — FUROSEMIDE 20 MG PO TABS: 20.0000 mg | ORAL_TABLET | Freq: Two times a day (BID) | ORAL | Status: DC

## 2012-01-11 MED ORDER — PANTOPRAZOLE SODIUM 40 MG PO TBEC
40.0000 mg | DELAYED_RELEASE_TABLET | Freq: Every day | ORAL | Status: DC
  Administered 2012-01-11 – 2012-01-12 (×2): 40 mg via ORAL
  Filled 2012-01-11 (×2): qty 1

## 2012-01-11 MED ORDER — HYDROMORPHONE HCL PF 1 MG/ML IJ SOLN
1.0000 mg | INTRAMUSCULAR | Status: DC | PRN
  Administered 2012-01-11: 2 mg via INTRAVENOUS
  Filled 2012-01-11: qty 2

## 2012-01-11 MED ORDER — HYDROMORPHONE HCL PF 1 MG/ML IJ SOLN
1.0000 mg | INTRAMUSCULAR | Status: DC | PRN
  Administered 2012-01-11: 1 mg via INTRAVENOUS
  Administered 2012-01-11: 2 mg via INTRAVENOUS
  Administered 2012-01-11: 1 mg via INTRAVENOUS
  Administered 2012-01-11: 2 mg via INTRAVENOUS
  Filled 2012-01-11: qty 2
  Filled 2012-01-11 (×2): qty 1
  Filled 2012-01-11: qty 2

## 2012-01-11 MED ORDER — CARISOPRODOL 350 MG PO TABS
350.0000 mg | ORAL_TABLET | Freq: Two times a day (BID) | ORAL | Status: DC
  Administered 2012-01-11 – 2012-01-12 (×4): 350 mg via ORAL
  Filled 2012-01-11 (×4): qty 1

## 2012-01-11 MED ORDER — TRAZODONE 25 MG HALF TABLET
25.0000 mg | ORAL_TABLET | Freq: Every evening | ORAL | Status: DC | PRN
  Filled 2012-01-11: qty 1

## 2012-01-11 NOTE — Progress Notes (Signed)
INITIAL ADULT NUTRITION ASSESSMENT Date: 01/11/2012   Time: 4:34 PM Reason for Assessment: MST  ASSESSMENT: Female 56 y.o.  Dx: Syncope  Hx:  Past Medical History  Diagnosis Date  . PERSONALITY DISORDER   . Chronic pain syndrome     chronic narcotics, dependancy hx - [facility] for pain mgmt  . MIGRAINE HEADACHE   . Lyme disease   . ALLERGIC RHINITIS   . ANEMIA-IRON DEFICIENCY   . HYPERLIPIDEMIA   . GERD   . DIABETES MELLITUS, TYPE II   . DEPRESSION   . SLE (systemic lupus erythematosus)     [facility] [place] - chronic pred  . Psoriasis   . Addison's disease   . Degenerative joint disease   . DDD (degenerative disc disease)   . Phlebitis after infusion     left ?calf; "S/P phenergan/demerol injection"  . Blood clot in vein 2011; 02/04/11    "right jugular vein;left jugular vein"  . Hypothyroidism   . SEIZURE DISORDER     "lots of seizures; due to lupus"  . Anxiety   . Neuropathy     hands, feet, due to diabetes & back problem  . Surgical wound infection 02/15/2011    R shoulder and mastectomy site - related to PICC  . CARCINOMA, BREAST 08/2009 dx    R breast,  mets to liver - resolution s/p chemo, s/p B mastect  . Metastases to the liver    Past Surgical History  Procedure Date  . Liver bopsy 09/2009  . Port a cath placement 09/2009; 11/19/10  . Port-a-cath removal 04/2010    "due to staph & strept"  . Port-a-cath removal 12/23/2010    Procedure: REMOVAL PORT-A-CATH;  Surgeon: Valarie Merino, MD;  Location: [facility];  Service: General;  Laterality: Left;  . Breast surgery 09/2009    right breast biopsy  . Mastectomy 11/19/10    bilateral mastectomy   . I&d extremity ~ 2010    right hand; S/P "dog bite"  . Peripherally inserted central catheter insertion     Related Meds:  Scheduled Meds:   . carisoprodol  350 mg Oral BID  . clonazePAM  1 mg Oral BID  . docusate sodium  100 mg Oral BID  . DULoxetine  60 mg Oral BID  . gabapentin  300 mg Oral TID  .  heparin  5,000 Units Subcutaneous Q8H  . [COMPLETED]  HYDROmorphone (DILAUDID) injection  1 mg Intravenous Once  . [COMPLETED]  HYDROmorphone (DILAUDID) injection  2 mg Intravenous Once  . [COMPLETED] ondansetron  8 mg Oral Once  . OxyCODONE  40 mg Oral Q12H  . pantoprazole  40 mg Oral Daily  . potassium chloride SA  20 mEq Oral BID  . [COMPLETED] potassium chloride  80 mEq Oral Once  . predniSONE  10 mg Oral Daily  . [COMPLETED] promethazine  12.5 mg Intravenous Once  . rOPINIRole  1 mg Oral QHS  . sodium chloride  1,000 mL Intravenous Once  . [DISCONTINUED] furosemide  20 mg Oral BID  . [DISCONTINUED] oxyCODONE  40 mg Oral Q12H  . [DISCONTINUED] potassium chloride  10 mEq Intravenous Once   Continuous Infusions:  PRN Meds:.bisacodyl, HYDROcodone-acetaminophen, HYDROmorphone (DILAUDID) injection, ondansetron (ZOFRAN) IV, ondansetron, traZODone, [DISCONTINUED]  HYDROmorphone (DILAUDID) injection, [DISCONTINUED]  HYDROmorphone (DILAUDID) injection   Ht: 5\' 4"  (162.6 cm)  Wt: 161 lb 13.1 oz (73.4 kg)  Ideal Wt:120 lbs % Ideal Wt: 75%  Usual Wt: 195 lbs % Usual Wt: 82%  Body mass index is 27.78 kg/(m^2).  Food/Nutrition Related Hx: unable to assess  Labs:  CMP     Component Value Date/Time   NA 137 01/11/2012 0510   NA 135* 12/26/2011 1455   K 3.1* 01/11/2012 0510   K 3.3* 12/26/2011 1455   CL 97 01/11/2012 0510   CL 96* 12/26/2011 1455   CO2 31 01/11/2012 0510   CO2 31* 12/26/2011 1455   GLUCOSE 122* 01/11/2012 0510   GLUCOSE 119* 12/26/2011 1455   BUN 12 01/11/2012 0510   BUN 22.0 12/26/2011 1455   CREATININE 0.80 01/11/2012 0510   CREATININE 1.3* 12/26/2011 1455   CALCIUM 8.8 01/11/2012 0510   CALCIUM 9.4 12/26/2011 1455   PROT 6.2 01/11/2012 0510   PROT 7.0 12/26/2011 1455   ALBUMIN 3.1* 01/11/2012 0510   ALBUMIN 3.8 12/26/2011 1455   AST 21 01/11/2012 0510   AST 17 12/26/2011 1455   ALT 18 01/11/2012 0510   ALT 14 12/26/2011 1455   ALKPHOS 94 01/11/2012 0510    ALKPHOS 85 12/26/2011 1455   BILITOT 0.3 01/11/2012 0510   BILITOT 0.28 12/26/2011 1455   GFRNONAA 80* 01/11/2012 0510   GFRAA >90 01/11/2012 0510    CBC    Component Value Date/Time   WBC 7.1 01/11/2012 0510   WBC 10.1 12/26/2011 1455   RBC 3.65* 01/11/2012 0510   RBC 3.80 12/26/2011 1455   HGB 9.0* 01/11/2012 0510   HGB 9.9* 12/26/2011 1455   HCT 29.6* 01/11/2012 0510   HCT 30.2* 12/26/2011 1455   PLT 330 01/11/2012 0510   PLT 254 12/26/2011 1455   MCV 81.1 01/11/2012 0510   MCV 79.3* 12/26/2011 1455   MCH 24.7* 01/11/2012 0510   MCH 25.9 12/26/2011 1455   MCHC 30.4 01/11/2012 0510   MCHC 32.7 12/26/2011 1455   RDW 16.4* 01/11/2012 0510   RDW 16.5* 12/26/2011 1455   LYMPHSABS 1.4 01/10/2012 1925   LYMPHSABS 0.5* 12/26/2011 1455   MONOABS 0.6 01/10/2012 1925   MONOABS 0.6 12/26/2011 1455   EOSABS 0.0 01/10/2012 1925   EOSABS 0.0 12/26/2011 1455   BASOSABS 0.0 01/10/2012 1925   BASOSABS 0.0 12/26/2011 1455    Intake:  Output:  No intake or output data in the 24 hours ending 01/11/12 1636   Diet Order: General  Supplements/Tube Feeding: none at this time  IVF:    Estimated Nutritional Needs:   Kcal: 1850-2000 Protein: 75-90g Fluid: >2.0 L/day  Pt admitted with syncope.  Pt with frequent falls at home. Pt is sleeping at time of visit, does not waken to verbal stimuli. She has a h/o breast cancer with mets which was resolved with treatment. Pt with concerning wt hx.  Per chart review, pt typically weighs 195 lbs.  Currently weighs 161 lbs (17% wt change in 9 months). RD to follow for assessment of nutrition status.    NUTRITION DIAGNOSIS: Unintended wt loss  RELATED TO: unknown etiology  AS EVIDENCE BY: pt with 17% wt loss in 9 months  MONITORING/EVALUATION(Goals): 1.  Food/Beverage; pt consuming >/=50% of meals. 2.  Wt/wt change; monitor trends  EDUCATION NEEDS: -Education not appropriate at this time  INTERVENTION: 1.  General healthful diet; encourage intake  of food and beverages.  Pt to improve intake to >50% of meals.  Supplements to be considered once intake established.   DOCUMENTATION CODES Per approved criteria  -Not Applicable    Loyce Dys, MS RD LDN Clinical Inpatient Dietitian Pager: [phone] Weekend/After hours pager: [phone]

## 2012-01-11 NOTE — Care Management Note (Unsigned)
    Page 1 of 1   01/12/2012     3:49:40 PM   CARE MANAGEMENT NOTE 01/12/2012  Patient:  [name] Carr, [name] Carr   Account Number:  [id number]  Date Initiated:  01/11/2012  Documentation initiated by:  Lanier Clam  Subjective/Objective Assessment:   ADMITTED [id number] NARCOTIC USE,BREAST CA.     Action/Plan:   FROM HOME   Anticipated DC Date:  01/13/2012   Anticipated DC Plan:  HOME/SELF CARE      DC Planning Services  CM consult      Choice offered to / List presented to:             Status of service:  In process, will continue to follow Medicare Important Message given?   (If response is "NO", the following Medicare IM given date fields will be blank) Date Medicare IM given:   Date Additional Medicare IM given:    Discharge Disposition:    Per UR Regulation:  Reviewed for med. necessity/level of care/duration of stay  If discussed at Long Length of Stay Meetings, dates discussed:    Comments:  01/12/12 Kiyo Heal RN,BSN NCM [phone] RECEIVED NARCAN TODAY.TRANSITIONING PAIN MED TO PO. 01/11/12 Juanitta Earnhardt RN,BSN NCM [phone] PROVIDED W/PAIN MANAGEMENT CLINICS IN [place].

## 2012-01-11 NOTE — H&P (Signed)
[facility] History and Physical  [name] Carr NWG:[id number] DOB: [birth year] DOA: 01/10/2012  Referring physician: Juleen China PCP: Rene Paci, MD  Specialists: None currently Dr. Elenora Gamma Dr. Lenox Ahr SLE Dr. Marlena Clipper sees her for the Breast cancer   Chief Complaint: Syncopy  HPI: [name] Carr is a 56 y.o. female came to [facility] ed 01/11/2012 with frequent falls and also seems to have episodes wherein she falls and seems to "fall asleep" while she is standing.  This afternoon she was trying to wash some dishes and then next thing she new she fell down.  She has been noted to fall asleep on the commode as well.  sHe fell onto her back and tailbone today and states that he rpain is severe  She talks on the phone and then apparently seems to fall asleep.  This seems to have started maybe about 2 months.  She describes no sensation of prodrome prior to falling. It seems to happen at all specific times, although she started at first to have it at night.  She had erysipelasand was rx for this about 3 weeks ago She was admitted to [place] recently for hyponatremia and hypokalemia She states she has tongue ulcerations.  She denies any specific other issue and these complaints sound somewhat chronic. She has wondered herself in the past whether it could be combination of her medications. She was recently prescribed OxyContin 40 mg every 12 hour by Dr. Cyndie Chime on 12/27/2011 and was told to get back into a pain clinic-she states that she misunderstood what Dr. Jessica Priest and she thought that he would be the one prescribing his chronic pain medications She states in the past that she has trialed Suboxone and was weaned off of 150 mg of fentanyl and Dilaudid orally although this did not help her pain right now she seemed that the pain is in her coccyx where she fell and she has 15 on 10 pain. She missed her evening dosages of Dilantin OxyContin at home and is requesting IV pain  medications at bedside.  When asked why she came to [facility] instead of [place] [facility] [patient lives in [place]] she reports that "they think that I am drug-seeking there"  Review of Systems: The patient states she has significant reflux, no CP, no SOb, no n/v, + current constipation with no stool for the past 6-7 days   Past Medical History  Diagnosis Date  . PERSONALITY DISORDER   . Chronic pain syndrome     chronic narcotics, dependancy hx - [place] for pain mgmt  . MIGRAINE HEADACHE   . Lyme disease   . ALLERGIC RHINITIS   . ANEMIA-IRON DEFICIENCY   . HYPERLIPIDEMIA   . GERD   . DIABETES MELLITUS, TYPE II   . DEPRESSION   . SLE (systemic lupus erythematosus)     r[facility] - chronic pred  . Psoriasis   . Addison's disease   . Degenerative joint disease   . DDD (degenerative disc disease)   . Phlebitis after infusion     left ?calf; "S/P phenergan/demerol injection"  . Blood clot in vein 2011; 02/04/11    "right jugular vein;left jugular vein"  . Hypothyroidism   . SEIZURE DISORDER     "lots of seizures; due to lupus"  . Anxiety   . Neuropathy     hands, feet, due to diabetes & back problem  . Surgical wound infection 02/15/2011    R shoulder and mastectomy site -  related to PICC  . CARCINOMA, BREAST 08/2009 dx    R breast,  mets to liver - resolution s/p chemo, s/p B mastect  . Metastases to the liver    Sees Dr. Cyndie Chime for Oncological issues-August of 2012 when she was dismissed from her oncologist in [facility] secondary to drug-seeking behavior she was diagnosed with a stage IIIA,Triple negative , 5 cm primary, cancer of the right breast, metastatic to liver at diagnosis in July 2011-see his note 12/26/11 Admission 12/16/11 for Migraine headaches Seen by ID in the past Dr. Zenaida Niece dam for recurrent Port-O-Cath infections-see his note 02/23/2011 Admission for syncopy 02/04/11-I admitted her and it was thought 2/2 to carotid compression-she  was admitted from Dr. 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 Liver bopsy 09/2009  . Port a cath placement 09/2009; 11/19/10  . Port-a-cath removal 04/2010    "due to staph & strept"  . Port-a-cath removal 12/23/2010    Procedure: REMOVAL PORT-A-CATH;  Surgeon: Valarie Merino, MD;  Location: [facility];  Service: General;  Laterality: Left;  . Breast surgery 09/2009    right breast biopsy  . Mastectomy 11/19/10    bilateral mastectomy   . I&d extremity ~ 2010    right hand; S/P "dog bite"  . Peripherally inserted central catheter insertion    Social History:  History   Social History Narrative  . No narrative on file    Allergies  Allergen Reactions  . Methotrexate Anaphylaxis  . Midazolam Hcl Anaphylaxis  . Ammonia Other (See Comments)    Ended up on ventilator from ammonia tabs  . Cephalexin Nausea And Vomiting and Other (See Comments)    Blisters.  . Contrast Media (Iodinated Diagnostic Agents) Other (See Comments)    Doesn't breath well.  . Infliximab Nausea And Vomiting  . Iohexol Other (See Comments)    SOB   . Ketorolac Tromethamine Other (See Comments)    Injectable doesn't work and pill hurts stomach.  . Nalbuphine Other (See Comments)    Unknown.  . Nsaids Nausea And Vomiting  . Ultram (Tramadol Hcl)   . Celecoxib Rash    Family History  Problem Relation Age of Onset  . Lung cancer Mother   . Lung cancer Father   . Arthritis Other   . Diabetes Other   . Hyperlipidemia Other     Prior to Admission medications   Medication Sig Start Date End Date Taking?  Authorizing Provider  carisoprodol (SOMA) 350 MG tablet Take 1 tablet (350 mg total) by mouth 4 (four) times daily as needed. Muscle pain 12/28/11  Yes Newt Lukes, MD  clonazePAM (KLONOPIN) 1 MG tablet TAKE 1 TABLET BY MOUTH TWICE DAILY AS NEEDED 12/09/11  Yes Newt Lukes, MD  docusate sodium (COLACE) 100 MG capsule Take 200 mg by mouth 2 (two) times daily.    Yes Historical Provider, MD  DULoxetine (CYMBALTA) 60 MG capsule Take 1 capsule (60 mg total) by mouth 2 (two) times daily. 11/23/11  Yes Newt Lukes, MD  EPINEPHrine (EPI-PEN) 0.3 mg/0.3 mL DEVI Inject 0.3 mg into the muscle once as needed. For bee  stings.  07/14/10  Yes Newt Lukes, MD  esomeprazole (NEXIUM) 40 MG capsule Take 40 mg by mouth daily before breakfast.   Yes Historical Provider, MD  furosemide (LASIX) 20 MG tablet Take 20 mg by mouth 2 (two) times daily.   Yes Historical Provider, MD  gabapentin (NEURONTIN) 300 MG capsule TAKE 3 CAPSULES BY MOUTH THREE TIMES DAILY 12/10/11  Yes Newt Lukes, MD  HYDROcodone-acetaminophen (NORCO) 10-325 MG per tablet Take 1-2 tablets by mouth every 8 (eight) hours as needed for pain. DO NOT FILL THIS PRESCRIPTION UNTIL 12/12/11 Take 1-2 tabs po Q 8 hours prn pain.  This is a 3 week supply Pt should ONLY get narcotics from Dr Cyndie Chime or Lonna Cobb, NP. [facility]) 12/09/11  Yes Levert Feinstein, MD  ondansetron (ZOFRAN-ODT) 8 MG disintegrating tablet Take 8 mg by mouth every 4 (four) hours as needed. Nausea.   Yes Historical Provider, MD  potassium chloride SA (K-DUR,KLOR-CON) 20 MEQ tablet Take 1 tablet (20 mEq total) by mouth 2 (two) times daily. Take 1 by mouth twice a day 01/04/12  Yes Newt Lukes, MD  predniSONE (DELTASONE) 10 MG tablet Take 10 mg by mouth daily.   Yes Historical Provider, MD  Probiotic Product (PROBIOTIC FORMULA) CAPS Take 1 capsule by mouth daily.    Yes Historical Provider, MD  promethazine (PHENERGAN) 25 MG  suppository Place 25 mg rectally every 4 (four) hours as needed. Nausea. 06/02/11  Yes Newt Lukes, MD  promethazine (PHENERGAN) 25 MG tablet Take 1 tablet (25 mg total) by mouth every 6 (six) hours as needed for nausea. 12/21/11  Yes Newt Lukes, MD  rOPINIRole (REQUIP) 1 MG tablet Take 1 tablet (1 mg total) by mouth at bedtime. 01/04/12 01/03/13 Yes Newt Lukes, MD   Physical Exam: Ceasar Mons Vitals:   01/10/12 1910 01/10/12 2008  BP: 123/73 143/66  Pulse: 86 83  Temp: 98.7 F (37.1 C)   TempSrc: Oral   Resp: 16   SpO2: 100% 100%   Alert flat affect looks older than stated age Chest clinically clear no added sounds S1-S2 no murmur rub or gallop Abdomen soft nontender nondistended no rebound or guarding Lower lumbar spine/sacral spine and coccyx seems somewhat tender however subjective findings out of proportion to objective observations, patient's vital signs are normal and her pulse rate is normal leading me to think that this may not be as painful as patient is making about the Lower extremities is soft nontender nondistended Motor is grossly intact patient able to move all 4 extremities Smile is symmetric pupils are not pinpoint   Labs on Admission:  Basic Metabolic Panel:  Lab 01/10/12 4098  NA 136  K 2.6*  CL 89*  CO2 34*  GLUCOSE 131*  BUN 15  CREATININE 0.86  CALCIUM 9.6  MG --  PHOS --   Liver Function Tests:  Lab 01/10/12 1925  AST 25  ALT 21  ALKPHOS 110  BILITOT 0.2*  PROT 7.0  ALBUMIN 3.5   No results found for this basename: LIPASE:5,AMYLASE:5 in the last 168 hours No results found for this basename: AMMONIA:5 in the last 168 hours CBC:  Lab 01/10/12 1925  WBC 9.1  NEUTROABS 7.0  HGB 9.9*  HCT 31.3*  MCV 79.4  PLT 398   Cardiac Enzymes:  Lab 01/10/12 2235  CKTOTAL --  CKMB --  CKMBINDEX --  TROPONINI <0.30    BNP (last 3 results) No results found for this  basename: PROBNP:3 in the last 8760 hours CBG: No results  found for this basename: GLUCAP:5 in the last 168 hours  Radiological Exams on Admission: No results found.  EKG: Independently reviewed. Sinus rhythm 80 beats per minute some minor ST depressions laterally inferiorly which is similar to prior  Assessment/Plan Principal Problem:  *Syncope Active Problems:  Breats Ca, 08/2009 - hx liver mets, resolved s/p chemo  DIABETES MELLITUS, TYPE II  ANEMIA-IRON DEFICIENCY  PERSONALITY DISORDER  DEPRESSION  Oral ulcer   1. Syncope likely secondary to multiple medication interactions. Patient reportedly falls asleep on her commode. Patient occasionally falls down and seems sometimes to lean to one side. I have a low suspicion that this is intracranial or cardiac and therefore not life-threatening at present time. She had a CT scan 12/15/2037 was negative. Actually took care of this patient 24 2012 and she had an extensive syncope workup which was thought to be secondary to vasovagal episode after she was getting carotid Doppler done and probably secondary to carotid compression. It think it would be very low yield to repeat any mural imaging. I have started to cut back her soma dose to twice day from 4 times a day and she will definitely need a pain physician to help wean her off multiple medications which interact. 2. Significant hypokalemia-this is being replaced by ED physician 3. Borderline personality disorder-she is on Cymbalta and Klonopin-would recommend outpatient close followup with psychiatrist 4. Lower back pain-because of her fall today where she fell and coccyx I will get a lumbar spinal x-ray. I have mentioned to her that we will try to control her pain with very judicious IV do a lot of. I've told her categorically that given her past history of being unresponsive after being given pain medications, I will not be administering Phenergan with the Dilantin. Have told her that if her x-rays turn out negative, she will likely be discharged home  with close followup with Dr. Edwena Felty and will need intensive multimodal pain management strategies. I believe that she would be best served seeing a pain specialist as soon as possible 5. Metabolic alkalosis-potentially contraction alkalosis from by mouth Lasix. She also has a potassium 2.6 which would fit this picture and the loss chloride. We will hold her Lasix overnight-rpt labs am. 6. DM ty 2-check CBg in am.  Add SSI 7. SLE-no recent follow up-Need work-up in near future.  Continue chronic steroids. 8. History metastatic breast cancer followed by oncologist-last CA 27.29 was 33-consider consulting Dr. Cyndie Chime tomorrow morning as he knows her very well [no active onc issues]   Code Status: Full Family Communication: Friend at bedside  Disposition Plan: Obs, tm 8, tele  Time spent: 45  Mahala Menghini [facility] Pager [phone]  If 7PM-7AM, please contact night-coverage [email or web address] Password [email or web address] 01/11/2012, 12:21 AM

## 2012-01-11 NOTE — Progress Notes (Signed)
Patient ID: [name] Carr  female  QIO:[id number]    DOB: 04-Aug-1955    DOA: 01/10/2012  PCP: Rene Paci, MD  Assessment/Plan: Principal Problem:  *Syncope: Likely secondary to orthostasis and polypharmacy. Extensive syncope workup done in 01/2011  Active Problems:  Breats Ca, 08/2009 - hx liver mets, resolved s/p chemo - Discussed in detail with Dr. temperature now, patient is not on any active chemotherapy currently - She had a PET scan done in April 2013, no evidence of metastatic disease or local recurrence of breast cancer   Chronic pain syndrome, lower back pain with narcotic dependence/tolerance - Restarted OxyContin 40mg  q12hrs, Vicodin PRN and decrease frequency of IV Dilaudid - I verified with Dr. Cyndie Chime regarding the above medications. Patient does have a drug-seeking component with tolerance to narcotics with dependency. She needs to see the rheumatologist and pain management clinic outpatient. Placed care management consult for pain management clinic referral - No acute lumbar vertebral fracture or any rib fracture    DIABETES MELLITUS, TYPE II: Stable   ANEMIA-IRON DEFICIENCY  DVT Prophylaxis: Heparin subcutaneous  Code Status: Full code Disposition: Hopefully tomorrow    Subjective: No specific complaints except constipation and asking about dilaudid shot Objective: Weight change:  No intake or output data in the 24 hours ending 01/11/12 1312 Blood pressure 126/56, pulse 81, temperature 98.6 F (37 C), temperature source Oral, resp. rate 18, height 5\' 4"  (1.626 m), weight 73.4 kg (161 lb 13.1 oz), SpO2 95.00%.  Physical Exam: General: Alert and awake, oriented x3, not in any acute distress. HEENT: anicteric sclera, pupils reactive to light and accommodation, EOMI CVS: S1-S2 clear, no murmur rubs or gallops Chest: clear to auscultation bilaterally, no wheezing, rales or rhonchi Abdomen: soft nontender, nondistended, normal bowel sounds, no  organomegaly Extremities: no cyanosis, clubbing or edema noted bilaterally Neuro: Cranial nerves II-XII intact, no focal neurological deficits  Lab Results: Basic Metabolic Panel:  Lab 01/11/12 3244 01/10/12 1925  NA 137 136  K 3.1* 2.6*  CL 97 89*  CO2 31 34*  GLUCOSE 122* 131*  BUN 12 15  CREATININE 0.80 0.86  CALCIUM 8.8 9.6  MG -- --  PHOS -- --   Liver Function Tests:  Lab 01/11/12 0510 01/10/12 1925  AST 21 25  ALT 18 21  ALKPHOS 94 110  BILITOT 0.3 0.2*  PROT 6.2 7.0  ALBUMIN 3.1* 3.5   No results found for this basename: LIPASE:2,AMYLASE:2 in the last 168 hours No results found for this basename: AMMONIA:2 in the last 168 hours CBC:  Lab 01/11/12 0510 01/10/12 1925  WBC 7.1 9.1  NEUTROABS -- 7.0  HGB 9.0* 9.9*  HCT 29.6* 31.3*  MCV 81.1 79.4  PLT 330 398   Cardiac Enzymes:  Lab 01/10/12 2235  CKTOTAL --  CKMB --  CKMBINDEX --  TROPONINI <0.30   BNP: No components found with this basename: POCBNP:2 CBG:  Lab 01/11/12 0756  GLUCAP 125*     Micro Results: Recent Results (from the past 240 hour(s))  MRSA PCR SCREENING     Status: Normal   Collection Time   01/11/12  4:02 AM      Component Value Range Status Comment   MRSA by PCR NEGATIVE  NEGATIVE Final     Studies/Results: Dg Chest 2 View  12/16/2011  *RADIOLOGY REPORT*  Clinical Data: Intermittent chest pain and cough; history of diabetes and hepatic metastases.  CHEST - 2 VIEW  Comparison: Chest radiograph performed 12/23/2010, and CT of the  chest performed 05/20/2011  Findings: The lungs are well-aerated and clear.  There is no evidence of focal opacification, pleural effusion or pneumothorax.  The heart is normal in size; the mediastinal contour is within normal limits.  No acute osseous abnormalities are seen.  The patient's right PICC is noted ending about the right brachiocephalic vein, near the SVC.  IMPRESSION: No acute cardiopulmonary process seen.   Original Report Authenticated By:  Tonia Ghent, M.D.    Dg Ribs Unilateral W/chest Left  01/11/2012  *RADIOLOGY REPORT*  Clinical Data: History of metastatic breast cancer. Left rib pain.  LEFT RIBS AND CHEST - 3+ VIEW  Comparison: Chest x-ray 12/16/2011 and PET CT 05/20/2011.  Findings: The cardiac silhouette, mediastinal and hilar contours are stable.  The lungs are clear.  No pulmonary mass lesions or nodules are identified.  No pleural effusion or pneumothorax.  Dedicated views of the left ribs demonstrate no acute bony findings or destructive bony lesions.  IMPRESSION: No acute cardiopulmonary findings and no definite left-sided rib abnormality.   Original Report Authenticated By: Rudie Meyer, M.D.    Dg Lumbar Spine Complete  01/11/2012  *RADIOLOGY REPORT*  Clinical Data: Low back pain.  LUMBAR SPINE - COMPLETE 4+ VIEW  Comparison: CT scan 05/20/2011.  Findings: Normal alignment of the lumbar vertebral bodies.  Mild stable degenerative anterolisthesis of L5.  No pars defects.  No acute bony findings or destructive bony changes.  The visualized bony pelvis is intact.  Extensive soft tissue calcifications are noted in the buttock regions bilaterally likely the injection granulomas.  IMPRESSION: No acute bony findings or significant degenerative changes.  Stable facet disease in the lower lumbar spine.   Original Report Authenticated By: Rudie Meyer, M.D.    Ct Head Wo Contrast  12/16/2011  *RADIOLOGY REPORT*  Clinical Data: Persistent headache  CT HEAD WITHOUT CONTRAST  Technique:  Contiguous axial images were obtained from the base of the skull through the vertex without contrast.  Comparison: 03/19/2011  Findings:  The gray-white differentiation is maintained.  No CT evidence of acute large territory infarct.  Bilateral basal ganglial calcifications.  No intraparenchymal or extra-axial mass or hemorrhage.  Normal size and configuration of the ventricles and basilar cisterns.  No midline shift.  Limited visualization of paranasal  sinuses and mastoid air cells are normal.  Regional soft tissues are normal.  No displaced calvarial fracture.  IMPRESSION: Negative noncontrast head CT.   Original Report Authenticated By: Tacey Ruiz, MD     Medications: Scheduled Meds:   . carisoprodol  350 mg Oral BID  . clonazePAM  1 mg Oral BID  . DULoxetine  60 mg Oral BID  . gabapentin  300 mg Oral TID  . heparin  5,000 Units Subcutaneous Q8H  . [COMPLETED]  HYDROmorphone (DILAUDID) injection  1 mg Intravenous Once  . [COMPLETED]  HYDROmorphone (DILAUDID) injection  2 mg Intravenous Once  . [COMPLETED] ondansetron  8 mg Oral Once  . OxyCODONE  40 mg Oral Q12H  . pantoprazole  40 mg Oral Daily  . potassium chloride SA  20 mEq Oral BID  . [COMPLETED] potassium chloride  80 mEq Oral Once  . predniSONE  10 mg Oral Daily  . [COMPLETED] promethazine  12.5 mg Intravenous Once  . rOPINIRole  1 mg Oral QHS  . sodium chloride  1,000 mL Intravenous Once  . [DISCONTINUED] furosemide  20 mg Oral BID  . [DISCONTINUED] oxyCODONE  40 mg Oral Q12H  . [DISCONTINUED] potassium chloride  10 mEq Intravenous Once      LOS: 1 day   Varvara Legault M.D. [facility] 01/11/2012, 1:12 PM Pager: [phone]  If 7PM-7AM, please contact night-coverage [email or web address] Password [email or web address]

## 2012-01-12 DIAGNOSIS — G8929 Other chronic pain: Secondary | ICD-10-CM

## 2012-01-12 DIAGNOSIS — F609 Personality disorder, unspecified: Secondary | ICD-10-CM

## 2012-01-12 DIAGNOSIS — M329 Systemic lupus erythematosus, unspecified: Secondary | ICD-10-CM

## 2012-01-12 DIAGNOSIS — K219 Gastro-esophageal reflux disease without esophagitis: Secondary | ICD-10-CM

## 2012-01-12 DIAGNOSIS — M549 Dorsalgia, unspecified: Secondary | ICD-10-CM

## 2012-01-12 DIAGNOSIS — R55 Syncope and collapse: Secondary | ICD-10-CM

## 2012-01-12 DIAGNOSIS — E119 Type 2 diabetes mellitus without complications: Secondary | ICD-10-CM

## 2012-01-12 LAB — GLUCOSE, CAPILLARY: Glucose-Capillary: 106 mg/dL — ABNORMAL HIGH (ref 70–99)

## 2012-01-12 MED ORDER — GABAPENTIN 300 MG PO CAPS: 300.0000 mg | ORAL_CAPSULE | Freq: Three times a day (TID) | ORAL | Status: DC

## 2012-01-12 MED ORDER — LIP MEDEX EX OINT
TOPICAL_OINTMENT | CUTANEOUS | Status: DC | PRN
  Filled 2012-01-12: qty 7

## 2012-01-12 MED ORDER — NALOXONE HCL 0.4 MG/ML IJ SOLN
INTRAMUSCULAR | Status: AC
  Filled 2012-01-12: qty 1

## 2012-01-12 MED ORDER — HYDROCODONE-ACETAMINOPHEN 5-325 MG PO TABS
1.0000 | ORAL_TABLET | Freq: Four times a day (QID) | ORAL | Status: DC | PRN
  Administered 2012-01-12: 1 via ORAL
  Filled 2012-01-12: qty 1

## 2012-01-12 MED ORDER — OXYCODONE HCL ER 20 MG PO T12A: 20.0000 mg | EXTENDED_RELEASE_TABLET | Freq: Two times a day (BID) | ORAL | Status: DC

## 2012-01-12 MED ORDER — NALOXONE HCL 0.4 MG/ML IJ SOLN
0.4000 mg | Freq: Once | INTRAMUSCULAR | Status: AC
  Administered 2012-01-12: 0.4 mg via INTRAVENOUS

## 2012-01-12 MED ORDER — CARISOPRODOL 350 MG PO TABS: 350.0000 mg | ORAL_TABLET | Freq: Two times a day (BID) | ORAL | Status: DC

## 2012-01-12 MED ORDER — HYDROCODONE-ACETAMINOPHEN 5-325 MG PO TABS: 1.0000 | ORAL_TABLET | Freq: Four times a day (QID) | ORAL | Status: DC | PRN
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
## 2012-01-12 NOTE — Discharge Summary (Signed)
Physician Discharge Summary  Patient ID: [name] Carr MRN: [id number] DOB/AGE: 1956-01-21 56 y.o.  Admit date: 01/10/2012 Discharge date: 01/12/2012  Primary Care Physician:  Rene Paci, MD  Discharge Diagnoses:    chronic pain syndrome with drug seeking behaviour  . Breats Ca, 08/2009 - hx liver mets, resolved s/p chemo . DEPRESSION . DIABETES MELLITUS, TYPE II . (Resolved) DVT (deep venous thrombosis) . ANEMIA-IRON DEFICIENCY . PERSONALITY DISORDER . Oral ulcer . (Resolved) Hypokalemia  Consults:  Dr Cyndie Chime via phone consultation   Discharge Medications:   Medication List     As of 01/12/2012  6:18 PM    STOP taking these medications         EPINEPHrine 0.3 mg/0.3 mL Devi   Commonly known as: EPI-PEN      HYDROcodone-acetaminophen 10-325 MG per tablet   Commonly known as: NORCO      oxyCODONE 40 MG 12 hr tablet   Commonly known as: OXYCONTIN      TAKE these medications         carisoprodol 350 MG tablet   Commonly known as: SOMA   Take 1 tablet (350 mg total) by mouth 2 (two) times daily. Muscle pain      clonazePAM 1 MG tablet   Commonly known as: KLONOPIN   TAKE 1 TABLET BY MOUTH TWICE DAILY AS NEEDED      docusate sodium 100 MG capsule   Commonly known as: COLACE   Take 200 mg by mouth 2 (two) times daily.      DULoxetine 60 MG capsule   Commonly known as: CYMBALTA   Take 1 capsule (60 mg total) by mouth 2 (two) times daily.      esomeprazole 40 MG capsule   Commonly known as: NEXIUM   Take 40 mg by mouth daily before breakfast.      furosemide 20 MG tablet   Commonly known as: LASIX   Take 20 mg by mouth 2 (two) times daily.      gabapentin 300 MG capsule   Commonly known as: NEURONTIN   Take 1 capsule (300 mg total) by mouth 3 (three) times daily.      HYDROcodone-acetaminophen 5-325 MG per tablet   Commonly known as: NORCO/VICODIN   Take 1-2 tablets by mouth every 6 (six) hours as needed for pain.      ondansetron 8 MG  disintegrating tablet   Commonly known as: ZOFRAN-ODT   Take 8 mg by mouth every 4 (four) hours as needed. Nausea.      OxyCODONE 20 mg T12a   Commonly known as: OXYCONTIN   Take 1 tablet (20 mg total) by mouth every 12 (twelve) hours.      potassium chloride SA 20 MEQ tablet   Commonly known as: K-DUR,KLOR-CON   Take 1 tablet (20 mEq total) by mouth 2 (two) times daily. Take 1 by mouth twice a day      predniSONE 10 MG tablet   Commonly known as: DELTASONE   Take 10 mg by mouth daily.      PROBIOTIC FORMULA Caps   Take 1 capsule by mouth daily.      promethazine 25 MG suppository   Commonly known as: PHENERGAN   Place 25 mg rectally every 4 (four) hours as needed. Nausea.      promethazine 25 MG tablet   Commonly known as: PHENERGAN   Take 1 tablet (25 mg total) by mouth every 6 (six) hours as needed for nausea.  rOPINIRole 1 MG tablet   Commonly known as: REQUIP   Take 1 tablet (1 mg total) by mouth at bedtime.         Brief H and P: For complete details please refer to admission H and P, but in brief [name] Carr is a 56 y.o. female came to [facility] 01/11/2012 with frequent falls and also seems to have episodes wherein she falls and seems to "fall asleep" while she is standing. This afternoon she was trying to wash some dishes and then next thing she new she fell down. She has been noted to fall asleep on the commode as well. sHe fell onto her back and tailbone today and states that he rpain is severe. She talks on the phone and then apparently seems to fall asleep. She was recently prescribed OxyContin 40 mg every 12 hour by Dr. Cyndie Chime on 12/27/2011 and was told to get back into a pain clinic-she states that she misunderstood what Dr. Jessica Priest said and she thought that he would be the one prescribing his chronic pain medications  She states in the past that she has trialed Suboxone and was weaned off of 150 mg of fentanyl and Dilaudid orally although this did not  help her pain right now she seemed that the pain is in her coccyx where she fell and she has 15 on 10 pain. She missed her evening dosages of OxyContin at home and was requesting IV pain medications at bedside.  When asked why she came to [facility] instead of [facility] [patient lives in [place]] she reports that "they think that I am drug-seeking there"    Hospital Course:  Principal Problem:  *Syncope: Likely secondary to multiple medication interactions. Patient had syncope workup done in December 2012 which was thought secondary to vasovagal episode. Given her presentation of falling asleep, the concern was that she was over medicating herself with narcotics.  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 I discussed in detail with Dr. Cyndie Chime, patient is not on any active chemotherapy currently. She had a PET scan done in April 2013 and had no evidence of metastatic disease or local recurrence of breast cancer.   Chronic pain syndrome, lower back pain with narcotic dependence/tolerance and drug seeking behavior, manipulative personality. Due to her complaints of fall, lumbar spine x-ray was done and rib x-ray was done which showed no fractures. I restarted her on OxyContin 40mg  q12hrs, Vicodin 10/325 PRN  as patient was continuously requesting IV Dilaudid every 2 hours scheduled.  I verified with Dr. Cyndie Chime regarding the above medications before starting them. I decreased soma to BID. Earlier in the morning, patient was difficult to arouse and had to be given narcan. I reduced Oxycontin to 20mg  q12hrs and vicodin to 5/325 PRN and explained the patient that over-medication was likely causing altered mental status and falls at home. Patient however refutes saying that she was just sleep deprived. Patient does have a drug-seeking component with tolerance to narcotics with dependency and manipulative behavior. She needs to see the rheumatologist and pain  management clinic outpatient. Placed care management consult for pain management clinic referral.     Day of Discharge BP 137/56  Pulse 92  Temp 98.2 F (36.8 C) (Axillary)  Resp 18  Ht 5\' 4"  (1.626 m)  Wt 73.4 kg (161 lb 13.1 oz)  BMI 27.78 kg/m2  SpO2 95%  Physical Exam: General: Alert and awake oriented x3 not in any  acute distress. HEENT: anicteric sclera, pupils reactive to light and accommodation CVS: S1-S2 clear no murmur rubs or gallops Chest: clear to auscultation bilaterally, no wheezing rales or rhonchi Abdomen: soft nontender, nondistended, normal bowel sounds, no organomegaly Extremities: no cyanosis, clubbing or edema noted bilaterally Neuro: Cranial nerves II-XII intact, no focal neurological deficits   The results of significant diagnostics from this hospitalization (including imaging, microbiology, ancillary and laboratory) are listed below for reference.    LAB RESULTS: Basic Metabolic Panel:  Lab 01/11/12 4098 01/10/12 1925  NA 137 136  K 3.1* 2.6*  CL 97 89*  CO2 31 34*  GLUCOSE 122* 131*  BUN 12 15  CREATININE 0.80 0.86  CALCIUM 8.8 9.6  MG -- --  PHOS -- --   Liver Function Tests:  Lab 01/11/12 0510 01/10/12 1925  AST 21 25  ALT 18 21  ALKPHOS 94 110  BILITOT 0.3 0.2*  PROT 6.2 7.0  ALBUMIN 3.1* 3.5   No results found for this basename: LIPASE:2,AMYLASE:2 in the last 168 hours No results found for this basename: AMMONIA:2 in the last 168 hours CBC:  Lab 01/11/12 0510 01/10/12 1925  WBC 7.1 9.1  NEUTROABS -- 7.0  HGB 9.0* 9.9*  HCT 29.6* 31.3*  MCV 81.1 --  PLT 330 398   Cardiac Enzymes:  Lab 01/10/12 2235  CKTOTAL --  CKMB --  CKMBINDEX --  TROPONINI <0.30   BNP: No components found with this basename: POCBNP:2 CBG:  Lab 01/12/12 0744 01/11/12 0756  GLUCAP 106* 125*    Significant Diagnostic Studies:  Dg Ribs Unilateral W/chest Left  01/11/2012  *RADIOLOGY REPORT*  Clinical Data: History of metastatic breast  cancer. Left rib pain.  LEFT RIBS AND CHEST - 3+ VIEW  Comparison: Chest x-ray 12/16/2011 and PET CT 05/20/2011.  Findings: The cardiac silhouette, mediastinal and hilar contours are stable.  The lungs are clear.  No pulmonary mass lesions or nodules are identified.  No pleural effusion or pneumothorax.  Dedicated views of the left ribs demonstrate no acute bony findings or destructive bony lesions.  IMPRESSION: No acute cardiopulmonary findings and no definite left-sided rib abnormality.   Original Report Authenticated By: Rudie Meyer, M.D.    Dg Lumbar Spine Complete  01/11/2012  *RADIOLOGY REPORT*  Clinical Data: Low back pain.  LUMBAR SPINE - COMPLETE 4+ VIEW  Comparison: CT scan 05/20/2011.  Findings: Normal alignment of the lumbar vertebral bodies.  Mild stable degenerative anterolisthesis of L5.  No pars defects.  No acute bony findings or destructive bony changes.  The visualized bony pelvis is intact.  Extensive soft tissue calcifications are noted in the buttock regions bilaterally likely the injection granulomas.  IMPRESSION: No acute bony findings or significant degenerative changes.  Stable facet disease in the lower lumbar spine.   Original Report Authenticated By: Rudie Meyer, M.D.      Disposition and Follow-up:     Discharge Orders    Future Appointments: Provider: Department: Dept Phone: Center:   01/16/2012 2:45 PM Newt Lukes, MD [facility] [phone] [facility]   02/27/2012 1:15 PM Radene Gunning [facility] [phone] None   02/27/2012 1:45 PM Rana Snare, NP [facility] [phone] None     Future Orders Please Complete By Expires   Diet - low sodium heart healthy      Increase activity slowly      Discharge instructions      Comments:  Please follow up with Dr Cyndie Chime, Dr Felicity Coyer ASAP. You need to call the pain clinic (information given to you) for appointment.        DISPOSITION: home  DIET: heart healthy diet  ACTIVITY: as tolerated   DISCHARGE FOLLOW-UP Follow-up Information    Follow up with Rene Paci, MD. On 01/16/2012. (at 2:45pm. Please have the referral to the pain clinic.  )    Contact information:   [address] [place] [address] [phone]          Time spent on Discharge: 35 mins   Signed:   Dadrian Ballantine M.D. [facility] 01/12/2012, 6:18 PM Pager: [phone]

## 2012-01-12 NOTE — Progress Notes (Signed)
Patient ID: [name] Carr  female  EAV:[id number]    DOB: 15-Jul-1955    DOA: 01/10/2012  PCP: Rene Paci, MD  Assessment/Plan: Principal Problem:  *Syncope: Likely secondary to orthostasis and polypharmacy. Extensive syncope workup done in 01/2011  Active Problems:  Breats Ca, 08/2009 - hx liver mets, resolved s/p chemo - Discussed in detail with Dr. Cyndie Chime yesterday, patient is not on any active chemotherapy currently - She had a PET scan done in April 2013, no evidence of metastatic disease or local recurrence of breast cancer   Chronic pain syndrome, lower back pain with narcotic dependence/tolerance - Restarted OxyContin 40mg  q12hrs, Vicodin PRN yesterday, however patient was too groggy this AM requiring narcan to arouse. - I reduced Oxycontin to 20mg  q12hrs and vicodin to 5/325 PRN and explained the patient that over-medication was likely causing altered mental status and falls at home. Patient however refutes saying that she was just sleep deprived.  - I verified with Dr. Cyndie Chime regarding the above medications. Patient does have a drug-seeking component with tolerance to narcotics with dependency. She needs to see the rheumatologist and pain management clinic outpatient. Placed care management consult for pain management clinic referral.  - No acute lumbar vertebral fracture or any rib fracture - Explained to patient that I will not increase the narcotics dose any further.    DIABETES MELLITUS, TYPE II: Stable   ANEMIA-IRON DEFICIENCY  DVT Prophylaxis: Heparin subcutaneous  Code Status: Full code  Disposition: Hopefully tomorrow, states nobody can pick her up today.     Subjective: Patient was seen earlier this morning at the time of breakfast, was too groggy and lethargic, required 0.4 mg of Narcan to arouse. Subsequently she was alert and awake and did well with physical therapy.   Patient seen multiple times during the day.  Objective: Weight change:    Intake/Output Summary (Last 24 hours) at 01/12/12 1726 Last data filed at 01/12/12 1447  Gross per 24 hour  Intake    230 ml  Output      0 ml  Net    230 ml   Blood pressure 137/56, pulse 92, temperature 98.2 F (36.8 C), temperature source Axillary, resp. rate 18, height 5\' 4"  (1.626 m), weight 73.4 kg (161 lb 13.1 oz), SpO2 95.00%.  Physical Exam: At 5 PM General: Alert and awake, oriented x3, not in any acute distress. HEENT: anicteric sclera, pupils reactive to light and accommodation, EOMI CVS: S1-S2 clear, no murmur rubs or gallops Chest: clear to auscultation bilaterally, no wheezing, rales or rhonchi Abdomen: soft nontender, nondistended, normal bowel sounds, no organomegaly Extremities: no cyanosis, clubbing or edema noted bilaterally Neuro: Cranial nerves II-XII intact, no focal neurological deficits  Lab Results: Basic Metabolic Panel:  Lab 01/11/12 7829 01/10/12 1925  NA 137 136  K 3.1* 2.6*  CL 97 89*  CO2 31 34*  GLUCOSE 122* 131*  BUN 12 15  CREATININE 0.80 0.86  CALCIUM 8.8 9.6  MG -- --  PHOS -- --   Liver Function Tests:  Lab 01/11/12 0510 01/10/12 1925  AST 21 25  ALT 18 21  ALKPHOS 94 110  BILITOT 0.3 0.2*  PROT 6.2 7.0  ALBUMIN 3.1* 3.5   CBC:  Lab 01/11/12 0510 01/10/12 1925  WBC 7.1 9.1  NEUTROABS -- 7.0  HGB 9.0* 9.9*  HCT 29.6* 31.3*  MCV 81.1 79.4  PLT 330 398   Cardiac Enzymes:  Lab 01/10/12 2235  CKTOTAL --  CKMB --  CKMBINDEX --  TROPONINI <0.30   BNP: No components found with this basename: POCBNP:2 CBG:  Lab 01/12/12 0744 01/11/12 0756  GLUCAP 106* 125*     Micro Results: Recent Results (from the past 240 hour(s))  MRSA PCR SCREENING     Status: Normal   Collection Time   01/11/12  4:02 AM      Component Value Range Status Comment   MRSA by PCR NEGATIVE  NEGATIVE Final     Studies/Results: Dg Chest 2 View  12/16/2011  *RADIOLOGY REPORT*  Clinical Data: Intermittent chest pain and cough; history of  diabetes and hepatic metastases.  CHEST - 2 VIEW  Comparison: Chest radiograph performed 12/23/2010, and CT of the chest performed 05/20/2011  Findings: The lungs are well-aerated and clear.  There is no evidence of focal opacification, pleural effusion or pneumothorax.  The heart is normal in size; the mediastinal contour is within normal limits.  No acute osseous abnormalities are seen.  The patient's right PICC is noted ending about the right brachiocephalic vein, near the SVC.  IMPRESSION: No acute cardiopulmonary process seen.   Original Report Authenticated By: Tonia Ghent, M.D.    Dg Ribs Unilateral W/chest Left  01/11/2012  *RADIOLOGY REPORT*  Clinical Data: History of metastatic breast cancer. Left rib pain.  LEFT RIBS AND CHEST - 3+ VIEW  Comparison: Chest x-ray 12/16/2011 and PET CT 05/20/2011.  Findings: The cardiac silhouette, mediastinal and hilar contours are stable.  The lungs are clear.  No pulmonary mass lesions or nodules are identified.  No pleural effusion or pneumothorax.  Dedicated views of the left ribs demonstrate no acute bony findings or destructive bony lesions.  IMPRESSION: No acute cardiopulmonary findings and no definite left-sided rib abnormality.   Original Report Authenticated By: Rudie Meyer, M.D.    Dg Lumbar Spine Complete  01/11/2012  *RADIOLOGY REPORT*  Clinical Data: Low back pain.  LUMBAR SPINE - COMPLETE 4+ VIEW  Comparison: CT scan 05/20/2011.  Findings: Normal alignment of the lumbar vertebral bodies.  Mild stable degenerative anterolisthesis of L5.  No pars defects.  No acute bony findings or destructive bony changes.  The visualized bony pelvis is intact.  Extensive soft tissue calcifications are noted in the buttock regions bilaterally likely the injection granulomas.  IMPRESSION: No acute bony findings or significant degenerative changes.  Stable facet disease in the lower lumbar spine.   Original Report Authenticated By: Rudie Meyer, M.D.    Ct Head Wo  Contrast  12/16/2011  *RADIOLOGY REPORT*  Clinical Data: Persistent headache  CT HEAD WITHOUT CONTRAST  Technique:  Contiguous axial images were obtained from the base of the skull through the vertex without contrast.  Comparison: 03/19/2011  Findings:  The gray-white differentiation is maintained.  No CT evidence of acute large territory infarct.  Bilateral basal ganglial calcifications.  No intraparenchymal or extra-axial mass or hemorrhage.  Normal size and configuration of the ventricles and basilar cisterns.  No midline shift.  Limited visualization of paranasal sinuses and mastoid air cells are normal.  Regional soft tissues are normal.  No displaced calvarial fracture.  IMPRESSION: Negative noncontrast head CT.   Original Report Authenticated By: Tacey Ruiz, MD     Medications: Scheduled Meds:    . carisoprodol  350 mg Oral BID  . clonazePAM  1 mg Oral BID  . docusate sodium  100 mg Oral BID  . DULoxetine  60 mg Oral BID  . gabapentin  300 mg Oral TID  . heparin  5,000 Units Subcutaneous  Q8H  . [COMPLETED] naloxone      . [COMPLETED] naLOXone (NARCAN)  injection  0.4 mg Intravenous Once  . OxyCODONE  20 mg Oral Q12H  . pantoprazole  40 mg Oral Daily  . potassium chloride SA  20 mEq Oral BID  . predniSONE  10 mg Oral Daily  . rOPINIRole  1 mg Oral QHS  . sodium chloride  1,000 mL Intravenous Once  . [DISCONTINUED] OxyCODONE  40 mg Oral Q12H      LOS: 2 days   RAI,RIPUDEEP M.D. [facility] 01/12/2012, 5:26 PM Pager: [phone]  If 7PM-7AM, please contact night-coverage [email or web address] Password [email or web address]

## 2012-01-16 ENCOUNTER — Ambulatory Visit: Payer: Medicare Other | Admitting: Internal Medicine

## 2012-01-16 ENCOUNTER — Telehealth: Payer: Self-pay | Admitting: Internal Medicine

## 2012-01-16 DIAGNOSIS — Z0289 Encounter for other administrative examinations: Secondary | ICD-10-CM

## 2012-01-16 DIAGNOSIS — G894 Chronic pain syndrome: Secondary | ICD-10-CM

## 2012-01-16 MED ORDER — ROPINIROLE HCL 1 MG PO TABS: 1.0000 mg | ORAL_TABLET | Freq: Every day | ORAL | Status: DC

## 2012-01-16 NOTE — Telephone Encounter (Signed)
Will refer to new pain clinic Ok to refill requip - erx done

## 2012-01-16 NOTE — Telephone Encounter (Signed)
Wants to be referred to the pain clinic.   She wants a new RX for ropinirole because she got hers wet when she dropped them in the sink. She picked them up and put them on a towel, but they aren't any good per the patient.   She can bring in the ones ruined if needed.

## 2012-01-17 ENCOUNTER — Other Ambulatory Visit: Payer: Self-pay | Admitting: Internal Medicine

## 2012-01-17 NOTE — Telephone Encounter (Signed)
Faxed script back to [facility].../lmb 

## 2012-01-17 NOTE — Telephone Encounter (Signed)
PT is aware.

## 2012-01-19 ENCOUNTER — Other Ambulatory Visit: Payer: Self-pay | Admitting: *Deleted

## 2012-01-19 NOTE — Telephone Encounter (Signed)
Received vm call from pt stating she needs a refill on her pain med & would like to pick up tomorrow.  She also reports that Dr. Felicity Coyer has made a referral to a pain clinic but haven't heard anything yet.  She wanted Dr. Cyndie Chime to know that she has r/s her liver scan to 02/17/12.

## 2012-01-20 ENCOUNTER — Other Ambulatory Visit: Payer: Self-pay | Admitting: *Deleted

## 2012-01-20 DIAGNOSIS — R52 Pain, unspecified: Secondary | ICD-10-CM

## 2012-01-20 MED ORDER — HYDROCODONE-ACETAMINOPHEN 10-325 MG PO TABS: 1.0000 | ORAL_TABLET | Freq: Four times a day (QID) | ORAL | Status: DC | PRN

## 2012-01-20 MED ORDER — OXYCODONE HCL ER 40 MG PO T12A: 40.0000 mg | EXTENDED_RELEASE_TABLET | Freq: Two times a day (BID) | ORAL | Status: DC

## 2012-01-23 ENCOUNTER — Ambulatory Visit (INDEPENDENT_AMBULATORY_CARE_PROVIDER_SITE_OTHER): Payer: Medicare Other | Admitting: Internal Medicine

## 2012-01-23 ENCOUNTER — Encounter: Payer: Self-pay | Admitting: Internal Medicine

## 2012-01-23 ENCOUNTER — Other Ambulatory Visit (INDEPENDENT_AMBULATORY_CARE_PROVIDER_SITE_OTHER): Payer: Medicare Other

## 2012-01-23 ENCOUNTER — Telehealth: Payer: Self-pay | Admitting: *Deleted

## 2012-01-23 VITALS — BP 112/72 | HR 80 | Temp 98.1°F | Ht 64.0 in | Wt 178.0 lb

## 2012-01-23 DIAGNOSIS — M329 Systemic lupus erythematosus, unspecified: Secondary | ICD-10-CM

## 2012-01-23 DIAGNOSIS — D509 Iron deficiency anemia, unspecified: Secondary | ICD-10-CM

## 2012-01-23 DIAGNOSIS — E876 Hypokalemia: Secondary | ICD-10-CM

## 2012-01-23 DIAGNOSIS — G894 Chronic pain syndrome: Secondary | ICD-10-CM

## 2012-01-23 NOTE — Assessment & Plan Note (Signed)
Narcotic dependency - prev followed with pain mgmt for same - [place], but dismissed 02/2011 I declined to increase Klonopin dose, refill Duragesic or change vicodin dose as requested today oxy from onc reviewed- pending eval by pain clinic to re-establish for ongoing care

## 2012-01-23 NOTE — Telephone Encounter (Signed)
Pt states she for got to get md to sign her handi-capp form. Place on md desk for signature...[name]

## 2012-01-23 NOTE — Patient Instructions (Addendum)
It was good to see you today. We have reviewed your prior records including labs and tests today Test(s) ordered today. Your results will be released to MyChart (or called to you) after review, usually within 72hours after test completion. If any changes need to be made, you will be notified at that same time. Please schedule followup in 4-6 months, call sooner if problems.

## 2012-01-23 NOTE — Assessment & Plan Note (Signed)
Chronic dz -

## 2012-01-23 NOTE — Progress Notes (Signed)
Subjective:    Patient ID: [name] Carr, female    DOB: [birth year], 56 y.o.   MRN: [id number]  HPI  Here for follow up - reviewed interval events and chronic medical issues:  Hospitalization Admit date: 01/10/2012 Discharge date: 01/12/2012  Discharge Diagnoses:    chronic pain syndrome with drug seeking behaviour  . Breats Ca, 08/2009 - hx liver mets, resolved s/p chemo . DEPRESSION . DIABETES MELLITUS, TYPE II . (Resolved) DVT (deep venous thrombosis) . ANEMIA-IRON DEFICIENCY . PERSONALITY DISORDER . Oral ulcer . 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 PET scan revealed liver involvement and biopsy confirmed poorly differentiated adenocarcinoma consistent with metastatic breast. Hx prior Port-A-Cath and PICC lines removed due to infection. Persisting clinical concerns re: possible self manipulation of these lines. Onc ([facility]) rx'd oral Xeloda, but not tolerated med due to nausea. Conservative mgmt ongoing given apparent quiet dz process  Chronic pain - previously managed by pain clinic Dr. Nilsa Nutting, dismissed 02/2011 - reports remotely on home self administered narcotic injections due to nausea and vomiting and difficulty keeping pills down - duragesic patches "helps but not enough" - also on cymbalta  SLE - hx pericarditis - on chronic prednisone for same -   follows periodically with rheum at [facility]- no fever, no new rash   Psoriasis - chronic plaque L elbow - uses steroid cream for same  Past Medical History  Diagnosis Date  . PERSONALITY DISORDER   . Chronic pain syndrome     chronic narcotics, dependancy hx - [place] for pain mgmt  . MIGRAINE HEADACHE   . Lyme disease   . ALLERGIC RHINITIS   . ANEMIA-IRON DEFICIENCY   .  HYPERLIPIDEMIA   . GERD   . DIABETES MELLITUS, TYPE II   . DEPRESSION   . SLE (systemic lupus erythematosus)     [facility] - chronic pred  . Psoriasis   . Addison's disease   . Degenerative joint disease   . DDD (degenerative disc disease)   . Phlebitis after infusion     left ?calf; "S/P phenergan/demerol injection"  . Blood clot in vein 2011; 02/04/11    "right jugular vein;left jugular vein"  . Hypothyroidism   . SEIZURE DISORDER     "lots of seizures; due to lupus"  . Anxiety   . Neuropathy     hands, feet, due to diabetes & back problem  . Surgical wound infection 02/15/2011    R shoulder and mastectomy site - related to PICC  . CARCINOMA, BREAST 08/2009 dx    R breast,  mets to liver - resolution s/p chemo, s/p B mastect  . Metastases to the liver     Review of Systems  Constitutional: Positive for fatigue. Negative for fever and unexpected weight change.  Respiratory: Negative for cough and shortness of breath.   Gastrointestinal: Negative for blood in stool.  Genitourinary: Negative for dysuria.  Musculoskeletal: Positive for extremity weakness.  Neurological: Negative for dizziness.       Objective:   Physical Exam  BP 112/72  Pulse 80  Temp 98.1 F (36.7 C) (Oral)  Ht 5\' 4"  (1.626 m)  Wt 178 lb (80.74 kg)  BMI 30.55 kg/m2  SpO2 97% Wt Readings from Last 3 Encounters:  01/23/12 178 lb (80.74 kg)  01/11/12 161 lb 13.1 oz (73.4 kg)  12/26/11 176 lb 3.2 oz (79.924 kg)   Constitutional: She is overweight; appears well-developed and well-nourished. No distress. Friend at side HENT: poor dentition, several extractions upper jaw Cardiovascular: Normal rate, regular rhythm and normal heart sounds.  No murmur heard. 1+ pitting BLE edema. Pulmonary/Chest: Effort normal and breath sounds normal. No respiratory distress. She has no wheezes.  Psychiatric: She has a normal mood and nonchalant affect. Her behavior is normal. Judgment and thought content  normal.   Lab Results  Component Value Date   WBC 7.1 01/11/2012   HGB 9.0* 01/11/2012   HCT 29.6* 01/11/2012   PLT 330 01/11/2012   GLUCOSE 122* 01/11/2012   CHOL 220* 09/12/2011   TRIG 121.0 09/12/2011   HDL 61.60 09/12/2011   LDLDIRECT 127.6 09/12/2011   LDLCALC 162 08/09/2009   ALT 18 01/11/2012   AST 21 01/11/2012   NA 137 01/11/2012   K 3.1* 01/11/2012   CL 97 01/11/2012   CREATININE 0.80 01/11/2012   BUN 12 01/11/2012   CO2 31 01/11/2012   TSH 3.428 02/05/2011   INR 1.02 02/04/2011   HGBA1C 6.2* 12/17/2011       Assessment & Plan:   See problem list. Medications and labs reviewed today.  Time spent with pt/freind today 25 minutes, greater than 50% time spent counseling patient on recent hospitalization, potassium, chronic pain and medication review. Also review of prior records

## 2012-01-23 NOTE — Assessment & Plan Note (Signed)
?  uncontrolled dz contributing to chronic pain pt declines to return to [facility] and feels [place] too far, prefers [facility] provider Chronic pred without change

## 2012-01-23 NOTE — Telephone Encounter (Signed)
done

## 2012-01-24 NOTE — Telephone Encounter (Signed)
Inform pt md sign mail to address....lmb

## 2012-01-25 ENCOUNTER — Other Ambulatory Visit: Payer: Self-pay | Admitting: Internal Medicine

## 2012-02-06 ENCOUNTER — Other Ambulatory Visit: Payer: Self-pay | Admitting: Internal Medicine

## 2012-02-14 ENCOUNTER — Other Ambulatory Visit: Payer: Self-pay | Admitting: Internal Medicine

## 2012-02-14 NOTE — Telephone Encounter (Signed)
Faxed script back to [facility].../lmb 

## 2012-02-15 ENCOUNTER — Other Ambulatory Visit: Payer: Self-pay | Admitting: *Deleted

## 2012-02-15 MED ORDER — GABAPENTIN 300 MG PO CAPS: 900.0000 mg | ORAL_CAPSULE | Freq: Three times a day (TID) | ORAL | Status: DC

## 2012-02-15 NOTE — Telephone Encounter (Signed)
R'cd fax from [facility] for refill of Gabapentin.

## 2012-02-16 ENCOUNTER — Other Ambulatory Visit: Payer: Self-pay

## 2012-02-16 NOTE — Telephone Encounter (Signed)
[name] Carr called stating that she has a CT Scan at [facility] tomorrow 02-17-12 at 1500.  She would like to pick up prescriptions tomorrow for her 2 pain meds.  The pain clinic has not called her with an appointment.  Please let her know if these prescriptions can be picked up 02-17-12. [name] states that the prescriptions are due Sunday 02-19-12.

## 2012-02-17 ENCOUNTER — Other Ambulatory Visit: Payer: Self-pay | Admitting: *Deleted

## 2012-02-17 ENCOUNTER — Ambulatory Visit (HOSPITAL_COMMUNITY): Payer: Medicare Other

## 2012-02-17 DIAGNOSIS — R52 Pain, unspecified: Secondary | ICD-10-CM

## 2012-02-17 MED ORDER — HYDROCODONE-ACETAMINOPHEN 10-325 MG PO TABS: 1.0000 | ORAL_TABLET | Freq: Four times a day (QID) | ORAL | Status: DC | PRN

## 2012-02-17 MED ORDER — OXYCODONE HCL ER 40 MG PO T12A: 40.0000 mg | EXTENDED_RELEASE_TABLET | Freq: Two times a day (BID) | ORAL | Status: DC

## 2012-02-17 NOTE — Telephone Encounter (Signed)
Pt left message asking if she could take 6 vicodin /d instead of 4/d until she gets into pain clinic.  Per Dr. Cyndie Chime OK for next script.  She is worried that she won't be able to get this filled next time b/c she will finish this new script sooner than prescribed.  Assured her that we would be able to take care of this on the next script.  She can take 1-2 po q 6h prn pain.

## 2012-02-17 NOTE — Telephone Encounter (Signed)
Pt notified that she can pick up scripts this pm but she states she doesn't have a ride & will p/u Monday when she comes for scan.  Message left with Dr Felicity Coyer asking if pain clinic referral has been made.  Pt states that she made it but she hasn't heard from anyone.

## 2012-02-20 ENCOUNTER — Ambulatory Visit (HOSPITAL_COMMUNITY)
Admission: RE | Admit: 2012-02-20 | Discharge: 2012-02-20 | Disposition: A | Discharge status: Home / Self Care | Payer: Medicare Other | Source: Ambulatory Visit | Attending: Oncology | Admitting: Oncology

## 2012-02-20 DIAGNOSIS — K5710 Diverticulosis of small intestine without perforation or abscess without bleeding: Secondary | ICD-10-CM | POA: Insufficient documentation

## 2012-02-20 DIAGNOSIS — M47817 Spondylosis without myelopathy or radiculopathy, lumbosacral region: Secondary | ICD-10-CM | POA: Insufficient documentation

## 2012-02-20 DIAGNOSIS — K449 Diaphragmatic hernia without obstruction or gangrene: Secondary | ICD-10-CM | POA: Insufficient documentation

## 2012-02-20 DIAGNOSIS — C787 Secondary malignant neoplasm of liver and intrahepatic bile duct: Secondary | ICD-10-CM | POA: Insufficient documentation

## 2012-02-20 DIAGNOSIS — C50919 Malignant neoplasm of unspecified site of unspecified female breast: Secondary | ICD-10-CM

## 2012-02-21 ENCOUNTER — Telehealth: Payer: Self-pay | Admitting: *Deleted

## 2012-02-21 NOTE — Telephone Encounter (Signed)
Spoke with pt.  Let her know that there was no sign of recurrent cancer in the live or abdominal cavity.  Reminder her of her appt. On 02/27/12 for lab and to see Lonna Cobb.

## 2012-02-21 NOTE — Telephone Encounter (Signed)
Message copied by Orbie Hurst on Tue Feb 21, 2012 10:48 AM ------      Message from: Levert Feinstein      Created: Mon Feb 20, 2012  5:26 PM       Call pt: no sign of recurrent cancer in the liver or abdominal cavity!

## 2012-02-24 ENCOUNTER — Other Ambulatory Visit: Payer: Self-pay | Admitting: Internal Medicine

## 2012-02-24 MED ORDER — CARISOPRODOL 350 MG PO TABS: 350.0000 mg | ORAL_TABLET | Freq: Three times a day (TID) | ORAL | Status: DC | PRN

## 2012-02-24 NOTE — Telephone Encounter (Signed)
Patient is requesting a refill on carispodol to [facility] [address]

## 2012-02-27 ENCOUNTER — Other Ambulatory Visit: Payer: Medicare Other | Admitting: Lab

## 2012-02-27 ENCOUNTER — Telehealth: Payer: Self-pay | Admitting: Oncology

## 2012-02-27 ENCOUNTER — Ambulatory Visit: Payer: Medicare Other | Admitting: Nurse Practitioner

## 2012-02-27 ENCOUNTER — Telehealth: Payer: Self-pay | Admitting: *Deleted

## 2012-02-27 NOTE — Telephone Encounter (Signed)
S/w the pt and she is aware of her feb appts. °

## 2012-02-27 NOTE — Telephone Encounter (Signed)
Patient left message that she was just woke up and she was too sick to come in today.  She will call back and re-schedule.

## 2012-03-01 ENCOUNTER — Other Ambulatory Visit: Payer: Self-pay | Admitting: Internal Medicine

## 2012-03-01 NOTE — Telephone Encounter (Signed)
Is this ok to refill. We never rx....lmb

## 2012-03-02 ENCOUNTER — Other Ambulatory Visit: Payer: Self-pay | Admitting: *Deleted

## 2012-03-02 DIAGNOSIS — R52 Pain, unspecified: Secondary | ICD-10-CM

## 2012-03-02 MED ORDER — HYDROCODONE-ACETAMINOPHEN 10-325 MG PO TABS: 1.0000 | ORAL_TABLET | Freq: Four times a day (QID) | ORAL | Status: DC | PRN

## 2012-03-02 MED ORDER — OXYCODONE HCL ER 40 MG PO T12A: 40.0000 mg | EXTENDED_RELEASE_TABLET | Freq: Two times a day (BID) | ORAL | Status: DC

## 2012-03-02 NOTE — Telephone Encounter (Signed)
Pt called asking for refill on her hydrocodone 10/325.  She states Dr Cyndie Chime said she could take 6/day.  She would like a new script for Monday. She also states that she will need her oxycontin 03/09/12 & wants to know if she can pick that up also on Monday to save her a trip.  She states she still has not heard from the pain clinic.  Message to Dr Diamantina Monks office asking if referral has been made & if they would f/u on this.  Note to Dr Cyndie Chime.

## 2012-03-07 ENCOUNTER — Telehealth: Payer: Self-pay | Admitting: *Deleted

## 2012-03-07 NOTE — Telephone Encounter (Signed)
Late Entry:  Received call yest from Gavin Pound stating that referral was made to Dr. Thyra Breed & not scheduled yet & she called pt to discuss & talked about sending to another pain clinic -[facility].  Pt was here yest to p/u script for pain & was upset b/c script was not changed to 1-2 tabs q 6 h but Dr. Cyndie Chime did agree to this but change was inadvertantly not done on script.  Will discuss with Dr Cyndie Chime again for next script.

## 2012-03-14 ENCOUNTER — Telehealth: Payer: Self-pay | Admitting: Internal Medicine

## 2012-03-14 ENCOUNTER — Ambulatory Visit: Payer: Self-pay | Admitting: Nurse Practitioner

## 2012-03-14 ENCOUNTER — Telehealth: Payer: Self-pay | Admitting: *Deleted

## 2012-03-14 ENCOUNTER — Other Ambulatory Visit: Payer: Self-pay | Admitting: Lab

## 2012-03-14 DIAGNOSIS — G894 Chronic pain syndrome: Secondary | ICD-10-CM

## 2012-03-14 NOTE — Telephone Encounter (Signed)
Caller: [name] Carr/Patient; Phone: [phone]; Reason for Call: Pt calling today 03/14/12 regarding referral to pain clinic.  Said pain clinic doctor told her they can't see her because she is a cancer pt and they don't treat cancer pt's.  Pt said her cancer is in remission and pain is not caused by her cancer.  Said her current cancer doctor is not comfortable prescribing her pain meds for her other ailments (Lupus pain).  Also said she is requiring stronger pain meds than he is comfortable prescribing.  She would like to talk with office about how to proceed since pain clinic will not accept her.  PLEASE CALL PT BACK AT [phone] TO ADVISE.  Thanks.

## 2012-03-14 NOTE — Telephone Encounter (Signed)
Received call from pt stating that she is sick & has an appt tomorrow with Lonna Cobb NP & not sure she should come it.  She's not sure is it is allergy or virus.  She states that she is aching & has a little fever but also has flaring with lupus.  She also asked if Dr. Cyndie Chime had discussed the pain clinic with Dr Felicity Coyer.  She states that [facility] wouldn't take her stating that they didn"t take cancer pts.   Suggested she call in the am if no better & should not come if running fever.  Also informed that she could wear a mask.  Message to [facility] & Dr. Cyndie Chime.

## 2012-03-14 NOTE — Telephone Encounter (Signed)
She needs to discuss this problem with her cancer doctor, may need note from cancer doctor to pain clinic stating that she is "cancer free" and should be managed at pain clinic same as any non-cancer pt -  I will also make new referral to different pain clinic -  i will not prescribe these medications for her - thanks

## 2012-03-15 ENCOUNTER — Ambulatory Visit (HOSPITAL_BASED_OUTPATIENT_CLINIC_OR_DEPARTMENT_OTHER): Payer: Medicare Other | Admitting: Nurse Practitioner

## 2012-03-15 ENCOUNTER — Telehealth: Payer: Self-pay | Admitting: Oncology

## 2012-03-15 ENCOUNTER — Telehealth: Payer: Self-pay | Admitting: Internal Medicine

## 2012-03-15 ENCOUNTER — Other Ambulatory Visit (HOSPITAL_BASED_OUTPATIENT_CLINIC_OR_DEPARTMENT_OTHER): Payer: Medicare Other | Admitting: Lab

## 2012-03-15 VITALS — BP 137/87 | HR 89 | Temp 98.6°F | Resp 18 | Ht 64.0 in | Wt 173.5 lb

## 2012-03-15 DIAGNOSIS — C50919 Malignant neoplasm of unspecified site of unspecified female breast: Secondary | ICD-10-CM

## 2012-03-15 DIAGNOSIS — R52 Pain, unspecified: Secondary | ICD-10-CM

## 2012-03-15 DIAGNOSIS — G8929 Other chronic pain: Secondary | ICD-10-CM

## 2012-03-15 DIAGNOSIS — G894 Chronic pain syndrome: Secondary | ICD-10-CM

## 2012-03-15 DIAGNOSIS — M329 Systemic lupus erythematosus, unspecified: Secondary | ICD-10-CM

## 2012-03-15 DIAGNOSIS — M549 Dorsalgia, unspecified: Secondary | ICD-10-CM

## 2012-03-15 DIAGNOSIS — C787 Secondary malignant neoplasm of liver and intrahepatic bile duct: Secondary | ICD-10-CM

## 2012-03-15 LAB — COMPREHENSIVE METABOLIC PANEL (CC13)
AST: 19 U/L (ref 5–34)
Albumin: 4.1 g/dL (ref 3.5–5.0)
BUN: 14.1 mg/dL (ref 7.0–26.0)
Calcium: 9.4 mg/dL (ref 8.4–10.4)
Chloride: 91 mEq/L — ABNORMAL LOW (ref 98–107)
Glucose: 131 mg/dl — ABNORMAL HIGH (ref 70–99)
Potassium: 3.2 mEq/L — ABNORMAL LOW (ref 3.5–5.1)
Sodium: 136 mEq/L (ref 136–145)
Total Protein: 7.5 g/dL (ref 6.4–8.3)

## 2012-03-15 LAB — CBC WITH DIFFERENTIAL/PLATELET
BASO%: 0.2 % (ref 0.0–2.0)
Basophils Absolute: 0 10*3/uL (ref 0.0–0.1)
HCT: 30.2 % — ABNORMAL LOW (ref 34.8–46.6)
HGB: 9.3 g/dL — ABNORMAL LOW (ref 11.6–15.9)
MONO#: 0.4 10*3/uL (ref 0.1–0.9)
NEUT%: 92.1 % — ABNORMAL HIGH (ref 38.4–76.8)
WBC: 11.5 10*3/uL — ABNORMAL HIGH (ref 3.9–10.3)
lymph#: 0.4 10*3/uL — ABNORMAL LOW (ref 0.9–3.3)

## 2012-03-15 MED ORDER — HYDROCODONE-ACETAMINOPHEN 10-325 MG PO TABS: 1.0000 | ORAL_TABLET | Freq: Four times a day (QID) | ORAL | Status: DC | PRN

## 2012-03-15 NOTE — Telephone Encounter (Signed)
Gave pt appt for lab and MD on May 2014 °

## 2012-03-15 NOTE — Telephone Encounter (Signed)
[facility]: Katheran James  [address] [place], [place] [address] [phone] - states this pt is not a candidate for their office ...Marland KitchenMarland KitchenMarland Kitchen   [facility]  [address] [place], [place], [place] [address] [phone]... Will not see this pt because she's a cancer pt   pls advise on where to send  Pt informed of this

## 2012-03-15 NOTE — Progress Notes (Signed)
OFFICE PROGRESS NOTE  Interval history:   [name] Carr is a 57 year old woman with metastatic breast cancer involving the liver. Initial diagnosis dates to August 2011. She had a large lesion in the right breast. Biopsies on 08/27/2009 showed high-grade invasive carcinoma, triple negative. PET scan showed activity over the breast lesion and in addition several small lesions seen in the right lobe of the liver with the largest measuring 1.2 x 0.6 cm. Liver biopsy 09/21/2009 was positive for adenocarcinoma. She received initial chemotherapy with Cytoxan plus Taxotere for 3 cycles with a dramatic response but poor tolerance. She was treated with Abraxane between 12/07/2009 and 04/27/2010 when she elected to stop all treatments due to poor tolerance. She was discharged from the oncology practice in [facility] and first saw Dr. Cyndie Chime on 02/04/2011. She was put on a brief trial of oral Xeloda which was also stopped for poor tolerance. CT scan of the abdomen and pelvis done 06/24/2010 showed a normal-appearing liver without any lesions. Mammograms and MRI of the breast showed residual tumor in the right breast. She elected to have bilateral mastectomies (11/19/2010). She had a number postoperative complications with wound infection and infection at the site of a Port-A-Cath infusion device that was placed at the time of the initial surgery. She had multiple hospitalizations related to the infections and additional infections of a PICC catheter placed after the Port-A-Cath was removed. Restaging CT scans of the chest, abdomen and pelvis and a PET scan on 05/20/2011 showed no evidence of active disease. Most recent restaging CT evaluation of the abdomen and pelvis showed no evidence for metastatic disease. She was noted to have degenerative facet disease in the lower lumbar spine.  She has a chronic pain syndrome related to lupus and chronic back pain. She currently receives pain medication through our office  including Norco 10/325 one tablet every 6 hours and OxyContin 40 mg every 12 hours which was started following her last visit in November until she could enroll in a local pain clinic.  She is seen today for scheduled followup. She feels she needs to increase the Norco to 2 tablets every 6 hours to control chronic back pain. She continues the OxyContin. She states she has been unsuccessful enrolling in any of the pain clinics in [place] because "they won't accept patient's with cancer".  Currently she has cold symptoms with nasal congestion and a low-grade fever.  She denies any changes over the chest wall.   Objective: Blood pressure 137/87, pulse 89, temperature 98.6 F (37 C), temperature source Oral, resp. rate 18, height 5\' 4"  (1.626 m), weight 173 lb 8 oz (78.699 kg).  Oropharynx is without thrush or ulceration. No palpable cervical, supraclavicular or axillary lymph nodes. Status post bilateral mastectomy. No evidence of chest wall recurrence. Lungs are clear. No wheezes or rales. Regular cardiac rhythm. Abdomen is soft and nontender. No organomegaly. Extremities are without edema.  Lab Results: Lab Results  Component Value Date   WBC 11.5* 03/15/2012   HGB 9.3* 03/15/2012   HCT 30.2* 03/15/2012   MCV 77.2* 03/15/2012   PLT 373 03/15/2012    Chemistry:    Chemistry      Component Value Date/Time   NA 136 03/15/2012 1410   NA 137 01/11/2012 0510   K 3.2* 03/15/2012 1410   K 3.8 01/23/2012 1647   CL 91* 03/15/2012 1410   CL 97 01/11/2012 0510   CO2 31* 03/15/2012 1410   CO2 31 01/11/2012 0510   BUN 14.1  03/15/2012 1410   BUN 12 01/11/2012 0510   CREATININE 1.1 03/15/2012 1410   CREATININE 0.80 01/11/2012 0510      Component Value Date/Time   CALCIUM 9.4 03/15/2012 1410   CALCIUM 8.8 01/11/2012 0510   ALKPHOS 86 03/15/2012 1410   ALKPHOS 94 01/11/2012 0510   AST 19 03/15/2012 1410   AST 21 01/11/2012 0510   ALT 19 03/15/2012 1410   ALT 18 01/11/2012 0510   BILITOT 0.43 03/15/2012 1410   BILITOT 0.3  01/11/2012 0510       Studies/Results: Ct Abdomen Pelvis Wo Contrast  02/20/2012  *RADIOLOGY REPORT*  Clinical Data: Breast cancer with liver metastases.  CT ABDOMEN AND PELVIS WITHOUT CONTRAST  Technique:  Multidetector CT imaging of the abdomen and pelvis was performed following the standard protocol without intravenous contrast.  Comparison: 05/20/2011  Findings: No focal abnormalities seen in the liver or spleen on this study performed without intravenous contrast material.  Tiny hiatal hernia noted.  Stomach otherwise unremarkable.  The duodenal diverticulum noted.  The pancreas, gallbladder, and adrenal glands are unremarkable.  No evidence for renal mass or hydronephrosis.  No abdominal aortic aneurysm.  There is no free fluid or lymphadenopathy in the abdomen.  Imaging through the pelvis shows no free intraperitoneal fluid. The bladder is distended but unremarkable in appearance.  Uterus is normal.  No adnexal mass.  No substantial diverticular change in the colon.  No colonic diverticulitis.  Terminal ileum is normal. The appendix is normal.  Bone windows reveal no worrisome lytic or sclerotic osseous lesions. Degenerative facet disease is noted in the lower lumbar spine bilaterally.  The patient has injection granuloma within the gluteal regions bilaterally.  IMPRESSION: Stable exam.  No evidence for metastatic disease in the abdomen or pelvis.   Original Report Authenticated By: Kennith Center, M.D.     Medications: I have reviewed the patient's current medications.  Assessment/Plan:  1. 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 Initially treated with Taxotere and Cytoxan for 3 cycles. Subsequent Abraxane given October 2011 through March 2012. Brief 2 week trial of Xeloda given through our office in September 2012 with discontinuation due to poor GI tolerance. Restaging CT scans of the chest, abdomen  and pelvis and a PET scan on 05/20/2011 showed no evidence of active disease. CA 27-29 in normal range (31) on 09/27/2011 and 12/26/2011 (33). Restaging CT abdomen/pelvis 02/20/2012 with no evidence of metastatic disease. 2. Status post bilateral simple mastectomies 11/19/2010. 3. Extensive chest wall cellulitis hospitalized 11/30/2010 through 12/08/2010. She was treated with IV vancomycin and Zosyn. 4. Status post Port-A-Cath placement 11/19/2010. The Port-A-Cath was removed on 12/23/2010 at which time she was hospitalized for confusion and fever. 5. Right shoulder abscess January 2013 status post incision and drainage. 6. SLE. 7. Chronic pain syndrome.  8. Narcotic dependence. 9. 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 10. Chronic nausea/vomiting. 11. Question small left axillary lymph node on exam 06/29/2011. No adenopathy detected on today's exam.  Disposition-[name] Carr appears stable from an oncology standpoint. Her main complaint is poor control of chronic back pain which is unrelated to her cancer diagnosis. She also has pain related to lupus. She is currently on OxyContin 40 mg every 12 hours and takes one Norco 5/325 tablets every 6 hours. She would like to increase the Norco to 2 tablets every 6 hours. We agreed  to a one time increase with a new prescription given to her today. She understands that she needs to enroll in a pain clinic for long-term management of non-cancer-related pain. She reports significant difficulty in establishing with a local pain clinic. The reasons for the difficulty are unclear to [place]. Dr. Cyndie Chime will discuss further with Dr. Felicity Coyer. We will also ask the office social worker to contact [name] Carr.   [name] Carr understands that we will no longer be providing prescriptions for pain medication. The rationale for this (i.e. pain is not cancer related and the initial agreement when we began prescribing pain medication was for  short-term management until she could enroll in a pain clinic and/or establish with a rheumatologist) was discussed with her extensively.  Patient was seen with Dr. Cyndie Chime.   [name] Carr ANP/GNP-BC

## 2012-03-15 NOTE — Telephone Encounter (Signed)
[facility] Triage Call Report Triage Record Num: [id number] Operator: Roselyn Meier Patient Name: [name] Carr Call Date & Time: 03/14/2012 5:14:43PM Patient Phone: [phone] PCP: Rene Paci Patient Gender: Female PCP Fax : [phone] Patient DOB: [birth year] Practice Name: [facility] Reason for Call: Caller: [name]/Patient; PCP: Rene Paci (Adults only); CB#: [phone]; Call regarding Pain Clinic Referral; Pt is calling is ask about pain clinic referral . Per EPIC, "Rene Paci, MD 03/14/2012 4:52 PM Signed She needs to discuss this problem with her cancer doctor, may need note from cancer doctor to pain clinic stating that she is "cancer free" and should be managed at pain clinic same as any non-cancer pt - I will also make new referral to different pain clinic - i will not prescribe these medications for her - thanks" RN relayed information to patient. Pt is worried about how she is going to manage her pain. Pt states she has an appt cancer doctor in the am (03/15/12) Protocol(s) Used: Office Note Recommended Outcome per Protocol: Information Noted and Sent to Office Reason for Outcome: Caller information to office

## 2012-03-16 ENCOUNTER — Other Ambulatory Visit: Payer: Self-pay | Admitting: Internal Medicine

## 2012-03-18 ENCOUNTER — Other Ambulatory Visit: Payer: Self-pay | Admitting: Internal Medicine

## 2012-03-19 ENCOUNTER — Other Ambulatory Visit: Payer: Self-pay | Admitting: *Deleted

## 2012-03-19 MED ORDER — CARISOPRODOL 350 MG PO TABS: 350.0000 mg | ORAL_TABLET | Freq: Three times a day (TID) | ORAL | Status: DC | PRN

## 2012-03-19 NOTE — Telephone Encounter (Signed)
Faxed script back to [facility].../lmb 

## 2012-03-19 NOTE — Telephone Encounter (Signed)
[facility]ed [facility] back [facility].../lmb 

## 2012-03-19 NOTE — Telephone Encounter (Signed)
Pt requesting refill on her soma...[name]

## 2012-03-19 NOTE — Telephone Encounter (Signed)
Pt has been notified.../lmb 

## 2012-03-20 ENCOUNTER — Telehealth: Payer: Self-pay | Admitting: *Deleted

## 2012-03-20 NOTE — Telephone Encounter (Signed)
Pt called about pain clinic referral & meds. She stated that she was told by someone that no one will accept her for new patient. She know Dr. Felicity Coyer will not rx any pain med and she only has 1 pill left. Dr. Laurence Spates will not rx any either. She stated that Dr. Laurence Spates states she should get med from Dr. Felicity Coyer. Advise pt again with previous msg per dr. Helen Hashimoto will not rx any pain meds. She is requesting to speak with office manger or dr. Helen Hashimoto. Inform her md was out this afternoon. Transferred call to [place]...[name]

## 2012-03-21 NOTE — Telephone Encounter (Signed)
1) Pt should go to ER for any withdrawal symptoms 2) pt will need to find a new PCP if she needs pain meds from PCP as i will not rx these meds

## 2012-03-26 NOTE — Telephone Encounter (Signed)
Print production planner [name] Carr) called pt back to inform md response. Pt had left manager a msg last week concerning issue...[name] Carr

## 2012-03-28 ENCOUNTER — Other Ambulatory Visit: Payer: Self-pay | Admitting: *Deleted

## 2012-03-28 MED ORDER — HYDROCHLOROTHIAZIDE 25 MG PO TABS: 25.0000 mg | ORAL_TABLET | Freq: Every day | ORAL | Status: DC

## 2012-03-29 ENCOUNTER — Telehealth: Payer: Self-pay | Admitting: Internal Medicine

## 2012-03-29 NOTE — Telephone Encounter (Signed)
Patient left message on triage requesting a refill on her pain medication to last until she can get in with pain clinic, she says that Dr. Cyndie Chime told her that Dr. Felicity Coyer would be willing to make an exception for her this time

## 2012-03-29 NOTE — Telephone Encounter (Signed)
Called patient with MDs response below. She states that she will be out of her pain meds on Sunday spoke with Dr. Felicity Coyer and she will not give the patient additional refills . States that the patient will need to present to the ED. We are still attempting a referral to a pain clinic but have been unsuccessful  Currently we are attempting a referral to [facility]  Called patient with the above she voiced understanding though she is concerned . Again directed her back to Dr. Cyndie Chime.

## 2012-03-29 NOTE — Telephone Encounter (Signed)
Please see prior phone notes. My answer is no. (It also appears Dr. Timoteo Expose office gave her the increase in Norco as requested at her office visit with him February 6)

## 2012-03-30 ENCOUNTER — Telehealth: Payer: Self-pay | Admitting: *Deleted

## 2012-03-30 NOTE — Telephone Encounter (Signed)
Pt. Called requesting pain medication.  Reviewed with her last note from visit with Lonna Cobb and Dr. Cyndie Chime, that we would be unable to give her more narcotics.  Discussed with Lonna Cobb NP in Dr. Patsy Lager absence.  Will leave note and let Dr. Frederich Chick know that she called and requested more narcotics.

## 2012-03-30 NOTE — Telephone Encounter (Signed)
Pt left vm on requesting refills on her pain meds. See previous msg concerning refills. Pt was contacted with md response...[name]

## 2012-04-02 ENCOUNTER — Other Ambulatory Visit: Payer: Self-pay | Admitting: Internal Medicine

## 2012-04-02 ENCOUNTER — Telehealth: Payer: Self-pay | Admitting: *Deleted

## 2012-04-02 NOTE — Telephone Encounter (Signed)
Lorynn called back inform her pt has been referred to [facility] for pain management, but they are reviewing her records and once they approve referral pt would be contacted by them. She was referred twice already to another pain management but both office decline to take her...lmb

## 2012-04-02 NOTE — Telephone Encounter (Signed)
Per Marcelino Duster will be faxing over [facility] referral form. Once pt has been approve md will be notified...[name]

## 2012-04-02 NOTE — Telephone Encounter (Signed)
Yes, but pt they have prev been unable to contact pt

## 2012-04-02 NOTE — Telephone Encounter (Signed)
Left msg on vm wanting to get information on pt referral to see pain clinic. Called Loryn back [facility] RTC....[name]

## 2012-04-02 NOTE — Telephone Encounter (Signed)
Lou received fax wanting to know would pt benefit from [facility]...[name]

## 2012-04-04 ENCOUNTER — Encounter: Payer: Self-pay | Admitting: *Deleted

## 2012-04-04 NOTE — Progress Notes (Signed)
[facility] Clinical Social Work  Clinical Social Work was referred by Systems developer for assessment of pain management needs.  Clinical Social Worker contacted patient by phone to offer support and assess for needs.  CSW explored patient's perception of her pain management issues.  Mrs. Kujala shared that she is in immense pain and does not know how to relieve pain without her pain medication.  She indicates that she is extremely frustrated and does not know how to change current situation. CSW validated patients feelings and discussed patient's options at this time.  CSW reviewed patient's options to pursue pain clinic for pain management or to enroll in a treatment program to treat her medication dependence and detox from her pain medications.  The patient was somewhat interested in suboxone as a treatment method and states she has tried this in the past.  CSW explained most centers enroll patients to participate in suboxone/methadone treatment as well as individual and group counseling.  Patient discussed in detail that she does not feel that she "has a problem" and does not view herself as a "substance abuser", despite being somewhat interested in suboxone treatment.   Patient agreed to accept resources provided by CSW for treatment, but feels that a pain management clinic is a better option for her at this time.  CSW spoke with Orlan Leavens, MA at Dr. Diamantina Monks office regarding patient's pain management clinic referrals.  Valentina Gu reports they have made multiple referrals to clinics and are currently awaiting response from [facility].   CSW identified [facility] in [place] as a possible option and made referral after approval from Dr. Cyndie Chime.  CSW spoke again briefly with patient who states she is completely out of medication and feels "terrible".  CSW updated patient that they are awaiting response from additional pain clinic referrals.  CSW again confirmed that Dr. Cyndie Chime  will no longer be prescribing patient pain medications at this time.  CSW shared treatment options in the community for detox/substance abuse treatment services and will mail patient a hard copy of possible resources.  All clinics require patient make contact to set up an appointment.  CSW encouraged patient to call with any additional questions or concerns.  Below is the information that was provided to patient: [facility] [address] [place], [place] [address] [phone] Provide Methadone and Suboxone / Subutex Hours: Mon. - Fri. 5:30am - 10:30am, Sat. 6:00am-8:30am,  and Sun. 6:00am-7:00am Intake: Mon. - Fri. 5:30am - 9:30am. Walk-ins welcome. Call [phone] for a free consultation. Phones are answered Sunday - Friday from 8am-5pm. If after hours: Voice messages will be returned on the  next business day.  [facility] [address] [place], [place] [address] [phone] Call to set up intake appointment.  [facility] [address] [place] [place], [place] [address] [phone] [facility] Weekly Schedule for IOP [facility]   Monday 9:00 a.m. - 12:00 p.m.   Wednesday 9:00 a.m. - 12:00 p.m.   Friday 9:00 a.m. - 12:00 p.m. Call [facility] to discuss your opioid problem, or submit a callback form online to provide those details A counselor will then contact you about the [facility] program, your treatment options, and your eligibility for services If accepted into the [facility], you will be provided several intake appointments and a physical exam Once intake is completed, you will receive methadone medication and be scheduled for counseling sessions   Kathrin Penner, MSW,  LCSW Clinical Child psychotherapist [facility] [phone]

## 2012-04-09 ENCOUNTER — Encounter: Payer: Self-pay | Admitting: *Deleted

## 2012-04-09 NOTE — Progress Notes (Signed)
[facility] Clinical Social Work  Clinical Social Work received call from patient that she received call from [facility] that they have agreed to see her at the end of the month.  She stated she is unsure that she will desire their services, but she has contacted a physician in [facility] that can provide suboxone as a treatment for her chronic pain.  The patient reports drug treatment centers are not an option for her because she is not addicted to pain medications. She states she has tried to be admitted for detox/rehab, but they "wouldn't accept her because she was not a drug abuser".  Patient plans to follow up with CSW after appointment with [facility].  Kathrin Penner, MSW, LCSW Clinical Social Worker [facility] [phone]

## 2012-04-16 ENCOUNTER — Other Ambulatory Visit: Payer: Self-pay | Admitting: Internal Medicine

## 2012-04-16 ENCOUNTER — Telehealth: Payer: Self-pay | Admitting: *Deleted

## 2012-04-16 NOTE — Telephone Encounter (Signed)
Faxed script back to [facility].../lmb 

## 2012-04-16 NOTE — Telephone Encounter (Signed)
PT. STATES HER PHARMACY HAS A NOTE ON HER RECORD THAT ONLY DR.GRANFORTUNA AND LISA THOMAS,NP MAY PRESCRIBE NARCOTICS FOR PT. DR.GRANFORTUNA AND LISA ARE NO LONGER GIVING PT. NARCOTICS SO SHE WOULD LIKE DR.GRANFORTUNA TO CALL HER PHARMACY SO SHE CAN GET HER PRESCRIPTIONS FILLED FROM HER NEW PHYSICIAN. PT. WOULD NOT TELL WHO HER NEW PHYSICIAN IS. VERBAL ORDER AND READ BACK TO LISA THOMAS,NP- DR.GRANFORTUNA AND I ARE NOT PRESCRIBING NARCOTICS FOR THIS PT. MAY CALL HER PHARMACY WITH THIS INFORMATION. CALL [facility] IN [place] AT #[phone] TO NOTIFY THE PHARMACY OF THE ABOVE INFORMATION. THE PHARMACIST VOICES UNDERSTANDING.

## 2012-04-19 ENCOUNTER — Other Ambulatory Visit: Payer: Self-pay | Admitting: Internal Medicine

## 2012-04-25 ENCOUNTER — Other Ambulatory Visit: Payer: Self-pay | Admitting: *Deleted

## 2012-04-25 MED ORDER — GABAPENTIN 300 MG PO CAPS: 900.0000 mg | ORAL_CAPSULE | Freq: Three times a day (TID) | ORAL | Status: DC

## 2012-05-15 ENCOUNTER — Other Ambulatory Visit: Payer: Self-pay | Admitting: Internal Medicine

## 2012-05-15 NOTE — Telephone Encounter (Signed)
Faxed script back to [facility].../lmb 

## 2012-05-22 ENCOUNTER — Other Ambulatory Visit: Payer: Self-pay | Admitting: Internal Medicine

## 2012-05-24 ENCOUNTER — Encounter (HOSPITAL_BASED_OUTPATIENT_CLINIC_OR_DEPARTMENT_OTHER): Payer: Self-pay

## 2012-05-24 ENCOUNTER — Emergency Department (HOSPITAL_BASED_OUTPATIENT_CLINIC_OR_DEPARTMENT_OTHER): Payer: Medicare Other

## 2012-05-24 ENCOUNTER — Emergency Department (HOSPITAL_BASED_OUTPATIENT_CLINIC_OR_DEPARTMENT_OTHER)
Admission: EM | Admit: 2012-05-24 | Discharge: 2012-05-24 | Disposition: A | Discharge status: Home / Self Care | Payer: Medicare Other | Attending: Emergency Medicine | Admitting: Emergency Medicine

## 2012-05-24 DIAGNOSIS — K219 Gastro-esophageal reflux disease without esophagitis: Secondary | ICD-10-CM | POA: Insufficient documentation

## 2012-05-24 DIAGNOSIS — Z853 Personal history of malignant neoplasm of breast: Secondary | ICD-10-CM | POA: Insufficient documentation

## 2012-05-24 DIAGNOSIS — Z8505 Personal history of malignant neoplasm of liver: Secondary | ICD-10-CM | POA: Insufficient documentation

## 2012-05-24 DIAGNOSIS — S7000XA Contusion of unspecified hip, initial encounter: Secondary | ICD-10-CM | POA: Insufficient documentation

## 2012-05-24 DIAGNOSIS — Y929 Unspecified place or not applicable: Secondary | ICD-10-CM | POA: Insufficient documentation

## 2012-05-24 DIAGNOSIS — Z8679 Personal history of other diseases of the circulatory system: Secondary | ICD-10-CM | POA: Insufficient documentation

## 2012-05-24 DIAGNOSIS — F609 Personality disorder, unspecified: Secondary | ICD-10-CM | POA: Insufficient documentation

## 2012-05-24 DIAGNOSIS — F3289 Other specified depressive episodes: Secondary | ICD-10-CM | POA: Insufficient documentation

## 2012-05-24 DIAGNOSIS — F329 Major depressive disorder, single episode, unspecified: Secondary | ICD-10-CM | POA: Insufficient documentation

## 2012-05-24 DIAGNOSIS — IMO0002 Reserved for concepts with insufficient information to code with codable children: Secondary | ICD-10-CM | POA: Insufficient documentation

## 2012-05-24 DIAGNOSIS — R5381 Other malaise: Secondary | ICD-10-CM | POA: Insufficient documentation

## 2012-05-24 DIAGNOSIS — S7001XA Contusion of right hip, initial encounter: Secondary | ICD-10-CM

## 2012-05-24 DIAGNOSIS — Z872 Personal history of diseases of the skin and subcutaneous tissue: Secondary | ICD-10-CM | POA: Insufficient documentation

## 2012-05-24 DIAGNOSIS — G43909 Migraine, unspecified, not intractable, without status migrainosus: Secondary | ICD-10-CM | POA: Insufficient documentation

## 2012-05-24 DIAGNOSIS — Z8619 Personal history of other infectious and parasitic diseases: Secondary | ICD-10-CM | POA: Insufficient documentation

## 2012-05-24 DIAGNOSIS — M62838 Other muscle spasm: Secondary | ICD-10-CM | POA: Insufficient documentation

## 2012-05-24 DIAGNOSIS — G8929 Other chronic pain: Secondary | ICD-10-CM

## 2012-05-24 DIAGNOSIS — Y939 Activity, unspecified: Secondary | ICD-10-CM | POA: Insufficient documentation

## 2012-05-24 DIAGNOSIS — Z8639 Personal history of other endocrine, nutritional and metabolic disease: Secondary | ICD-10-CM | POA: Insufficient documentation

## 2012-05-24 DIAGNOSIS — M549 Dorsalgia, unspecified: Secondary | ICD-10-CM | POA: Insufficient documentation

## 2012-05-24 DIAGNOSIS — F411 Generalized anxiety disorder: Secondary | ICD-10-CM | POA: Insufficient documentation

## 2012-05-24 DIAGNOSIS — W010XXA Fall on same level from slipping, tripping and stumbling without subsequent striking against object, initial encounter: Secondary | ICD-10-CM | POA: Insufficient documentation

## 2012-05-24 DIAGNOSIS — Z862 Personal history of diseases of the blood and blood-forming organs and certain disorders involving the immune mechanism: Secondary | ICD-10-CM | POA: Insufficient documentation

## 2012-05-24 DIAGNOSIS — R112 Nausea with vomiting, unspecified: Secondary | ICD-10-CM | POA: Insufficient documentation

## 2012-05-24 DIAGNOSIS — Z8669 Personal history of other diseases of the nervous system and sense organs: Secondary | ICD-10-CM | POA: Insufficient documentation

## 2012-05-24 DIAGNOSIS — Z79899 Other long term (current) drug therapy: Secondary | ICD-10-CM | POA: Insufficient documentation

## 2012-05-24 DIAGNOSIS — M1990 Unspecified osteoarthritis, unspecified site: Secondary | ICD-10-CM | POA: Insufficient documentation

## 2012-05-24 DIAGNOSIS — M329 Systemic lupus erythematosus, unspecified: Secondary | ICD-10-CM | POA: Insufficient documentation

## 2012-05-24 DIAGNOSIS — E119 Type 2 diabetes mellitus without complications: Secondary | ICD-10-CM | POA: Insufficient documentation

## 2012-05-24 HISTORY — DX: Rheumatoid arthritis, unspecified: M06.9

## 2012-05-24 LAB — BASIC METABOLIC PANEL
BUN: 20 mg/dL (ref 6–23)
Calcium: 8.9 mg/dL (ref 8.4–10.5)
GFR calc Af Amer: 71 mL/min — ABNORMAL LOW (ref >90)
GFR calc non Af Amer: 61 mL/min — ABNORMAL LOW (ref >90)
Potassium: 3.5 mEq/L (ref 3.5–5.1)

## 2012-05-24 MED ORDER — ONDANSETRON 8 MG PO TBDP
8.0000 mg | ORAL_TABLET | Freq: Once | ORAL | Status: AC
  Administered 2012-05-24: 8 mg via ORAL
  Filled 2012-05-24: qty 1

## 2012-05-24 MED ORDER — HYDROCODONE-ACETAMINOPHEN 5-325 MG PO TABS
2.0000 | ORAL_TABLET | Freq: Once | ORAL | Status: AC
  Administered 2012-05-24: 2 via ORAL
  Filled 2012-05-24: qty 2

## 2012-05-24 NOTE — ED Notes (Signed)
Patient transported to X-ray 

## 2012-05-24 NOTE — ED Provider Notes (Signed)
History     CSN: [id number]  Arrival date & time 05/24/12  1951   First MD Initiated Contact with Patient 05/24/12 2009      Chief Complaint  Patient presents with  . Cramps   . Fall    (Consider location/radiation/quality/duration/timing/severity/associated sxs/prior treatment) HPI Comments: Patient presents with muscle cramps. She has cramping in her hands and her lower legs. She states she's had a history of cramps in the past with low potassium. She states this is been going on for last 2-3 days and has worse today. She also states that just prior to arrival she fell after she tripped on a dog toy and landed on her right hip. She complains of a constant throbbing pain to her right hip. She denies any other injuries from the fall. She has a history of chronic joint and back pain related to lupus and chronic back pain. She also has a history of metastatic breast cancer which on her restaging in January did not show any evidence of metastatic disease. She status post bilateral mastectomies. She was previously seeing Dr. Justine Null for cancer management and pain management at that time. In February she was told by Dr. Cyndie Chime that she would no longer be getting pain prescriptions from the office. She subsequently only been appointment with a pain clinic.  Patient is a 57 y.o. female presenting with fall.  Fall Associated symptoms include nausea and vomiting (chronic). Pertinent negatives include no fever, no numbness, no abdominal pain, no hematuria and no headaches.    Past Medical History  Diagnosis Date  . PERSONALITY DISORDER   . Chronic pain syndrome     chronic narcotics, dependancy hx - [facility] for pain mgmt  . MIGRAINE HEADACHE   . Lyme disease   . ALLERGIC RHINITIS   . ANEMIA-IRON DEFICIENCY   . HYPERLIPIDEMIA   . GERD   . DIABETES MELLITUS, TYPE II   . DEPRESSION   . SLE (systemic lupus erythematosus)     [facility] - chronic pred  . Psoriasis   .  Addison's disease   . Degenerative joint disease   . DDD (degenerative disc disease)   . Phlebitis after infusion     left ?calf; "S/P phenergan/demerol injection"  . Blood clot in vein 2011; 02/04/11    "right jugular vein;left jugular vein"  . Hypothyroidism   . SEIZURE DISORDER     "lots of seizures; due to lupus"  . Anxiety   . Neuropathy     hands, feet, due to diabetes & back problem  . Surgical wound infection 02/15/2011    R shoulder and mastectomy site - related to PICC  . CARCINOMA, BREAST 08/2009 dx    R breast,  mets to liver - resolution s/p chemo, s/p B mastect  . Metastases to the liver   . RA (rheumatoid arthritis)     Past Surgical History  Procedure Laterality Date  . Liver bopsy  09/2009  . Port a cath placement  09/2009; 11/19/10  . Port-a-cath removal  04/2010    "due to staph & strept"  . Port-a-cath removal  12/23/2010    Procedure: REMOVAL PORT-A-CATH;  Surgeon: Valarie Merino, MD;  Location: [facility];  Service: General;  Laterality: Left;  . Breast surgery  09/2009    right breast biopsy  . Mastectomy  11/19/10    bilateral mastectomy   . I&d extremity  ~ 2010    right hand; S/P "dog bite"  . Peripherally  inserted central catheter insertion      Family History  Problem Relation Age of Onset  . Lung cancer Mother   . Lung cancer Father   . Arthritis Other   . Diabetes Other   . Hyperlipidemia Other     History  Substance Use Topics  . Smoking status: Never Smoker   . Smokeless tobacco: Never Used  . Alcohol Use: No    OB History   Grav Para Term Preterm Abortions TAB SAB Ect Mult Living                  Review of Systems  Constitutional: Positive for fatigue. Negative for fever, chills and diaphoresis.  HENT: Negative for congestion, rhinorrhea and sneezing.   Eyes: Negative.   Respiratory: Negative for cough, chest tightness and shortness of breath.   Cardiovascular: Negative for chest pain and leg swelling.  Gastrointestinal:  Positive for nausea and vomiting (chronic). Negative for abdominal pain, diarrhea and blood in stool.  Genitourinary: Negative for frequency, hematuria, flank pain and difficulty urinating.  Musculoskeletal: Positive for myalgias, back pain and arthralgias.  Skin: Negative for rash.  Neurological: Negative for dizziness, speech difficulty, weakness, numbness and headaches.    Allergies  Methotrexate; Midazolam hcl; Ammonia; Cephalexin; Contrast media; Infliximab; Iohexol; Ketorolac tromethamine; Nalbuphine; Nsaids; Ultram; and Celecoxib  Home Medications   Current Outpatient Rx  Name  Route  Sig  Dispense  Refill  . carisoprodol (SOMA) 350 MG tablet   Oral   Take 1 tablet (350 mg total) by mouth 3 (three) times daily as needed for muscle spasms. Muscle pain   30 tablet   1   . EXPIRED: cephALEXin (KEFLEX) 500 MG capsule   Oral   Take 1 capsule (500 mg total) by mouth 4 (four) times daily.   80 capsule   2   . clonazePAM (KLONOPIN) 1 MG tablet      TAKE 1 TABLET BY MOUTH TWICE DAILY AS NEEDED   60 tablet   0   . docusate sodium (COLACE) 100 MG capsule   Oral   Take 200 mg by mouth 2 (two) times daily.          . DULoxetine (CYMBALTA) 60 MG capsule   Oral   Take 1 capsule (60 mg total) by mouth 2 (two) times daily.   180 capsule   1   . furosemide (LASIX) 20 MG tablet      TAKE 1 TABLET BY MOUTH TWICE DAILY   60 tablet   5   . gabapentin (NEURONTIN) 300 MG capsule   Oral   Take 3 capsules (900 mg total) by mouth 3 (three) times daily.   810 capsule   0   . gabapentin (NEURONTIN) 300 MG capsule      TAKE 3 CAPSULES BY MOUTH THREE TIMES DAILY   810 capsule   0   . hydrochlorothiazide (HYDRODIURIL) 25 MG tablet   Oral   Take 1 tablet (25 mg total) by mouth daily.   30 tablet   5   . hydrochlorothiazide (HYDRODIURIL) 25 MG tablet      TAKE 1 TABLET BY MOUTH EVERY DAY   30 tablet   5   . HYDROcodone-acetaminophen (NORCO) 10-325 MG per tablet    Oral   Take 1-2 tablets by mouth every 6 (six) hours as needed for pain.   112 tablet   0     DO NOT FILL UNTIL UNTIL 03/20/12.   Marland Kitchen EXPIRED: ondansetron (  ZOFRAN ODT) 8 MG disintegrating tablet   Oral   Take 1 tablet (8 mg total) by mouth every 8 (eight) hours as needed for nausea.   20 tablet   0   . ondansetron (ZOFRAN-ODT) 8 MG disintegrating tablet   Oral   Take 8 mg by mouth every 4 (four) hours as needed. Nausea.         . OxyCODONE (OXYCONTIN) 40 mg T12A   Oral   Take 1 tablet (40 mg total) by mouth every 12 (twelve) hours.   60 tablet   0     Pt will p/u script Monday 03/05/12.  DO NOT FILL UN ...   . pantoprazole (PROTONIX) 40 MG tablet      Take 1 by mouth daily         . potassium chloride SA (K-DUR,KLOR-CON) 20 MEQ tablet   Oral   Take 1 tablet (20 mEq total) by mouth 2 (two) times daily. Take 1 by mouth twice a day   60 tablet   5   . potassium chloride SA (K-DUR,KLOR-CON) 20 MEQ tablet      TAKE 1 TABLET BY MOUTH TWICE DAILY OR AS DIRECTED   60 tablet   2   . predniSONE (DELTASONE) 10 MG tablet      TAKE 1 TABLET BY MOUTH EVERY DAY   30 tablet   2   . Probiotic Product (PROBIOTIC FORMULA) CAPS   Oral   Take 1 capsule by mouth daily.          . promethazine (PHENERGAN) 25 MG suppository   Rectal   Place 25 mg rectally every 4 (four) hours as needed. Nausea.         . promethazine (PHENERGAN) 25 MG tablet      TAKE 1 TABLET BY MOUTH EVERY 6 HOURS AS NEEDED FOR NAUSEA   30 tablet   0   . rOPINIRole (REQUIP) 1 MG tablet      TAKE 1 TABLET BY MOUTH EVERY NIGHT AT BEDTIME   30 tablet   5   . spironolactone-hydrochlorothiazide (ALDACTAZIDE) 25-25 MG per tablet      Take 1 by mouth twice a day           BP 112/65  Pulse 79  Temp(Src) 98.5 F (36.9 C) (Oral)  Resp 18  Ht 5\' 4"  (1.626 m)  Wt 159 lb (72.122 kg)  BMI 27.28 kg/m2  SpO2 95%  Physical Exam  Constitutional: She is oriented to person, place, and time. She appears  well-developed and well-nourished.  HENT:  Head: Normocephalic and atraumatic.  Eyes: Pupils are equal, round, and reactive to light.  Neck: Normal range of motion. Neck supple.  Cardiovascular: Normal rate, regular rhythm and normal heart sounds.   Pulmonary/Chest: Effort normal and breath sounds normal. No respiratory distress. She has no wheezes. She has no rales. She exhibits no tenderness.  Abdominal: Soft. Bowel sounds are normal. There is no tenderness. There is no rebound and no guarding.  Musculoskeletal: Normal range of motion. She exhibits no edema.  Mild TTP right hip and pain on ROM right hip.  No pain to knee or ankle.  NV intact  Lymphadenopathy:    She has no cervical adenopathy.  Neurological: She is alert and oriented to person, place, and time.  Skin: Skin is warm and dry. No rash noted.  Psychiatric: She has a normal mood and affect.    ED Course  Procedures (including critical care time)  Results for orders placed during the hospital encounter of 05/24/12  BASIC METABOLIC PANEL      Result Value Range   Sodium 135  135 - 145 mEq/L   Potassium 3.5  3.5 - 5.1 mEq/L   Chloride 96  96 - 112 mEq/L   CO2 28  19 - 32 mEq/L   Glucose, Bld 126 (*) 70 - 99 mg/dL   BUN 20  6 - 23 mg/dL   Creatinine, Ser 4.09  0.50 - 1.10 mg/dL   Calcium 8.9  8.4 - 81.1 mg/dL   GFR calc non Af Amer 61 (*) >90 mL/min   GFR calc Af Amer 71 (*) >90 mL/min   Dg Hip Complete Right  05/24/2012  *RADIOLOGY REPORT*  Clinical Data: Pain after a fall tonight.  RIGHT HIP - COMPLETE 2+ VIEW  Comparison: 08/09/2008  Findings: There is no fracture, dislocation, or other abnormality of the osseous structures.  The patient has numerous injection granulomas in the lateral aspect of the right thigh and in both buttocks.  IMPRESSION: Normal right hip.  Numerous injection granulomas.   Original Report Authenticated By: Francene Boyers, M.D.      Dg Hip Complete Right  05/24/2012  *RADIOLOGY REPORT*   Clinical Data: Pain after a fall tonight.  RIGHT HIP - COMPLETE 2+ VIEW  Comparison: 08/09/2008  Findings: There is no fracture, dislocation, or other abnormality of the osseous structures.  The patient has numerous injection granulomas in the lateral aspect of the right thigh and in both buttocks.  IMPRESSION: Normal right hip.  Numerous injection granulomas.   Original Report Authenticated By: Francene Boyers, M.D.      1. Chronic pain   2. Contusion, hip, right, initial encounter       MDM  Patient with no evidence of fracture. Her electrolytes are unremarkable. I feel that this is more of a chronic pain exacerbation. She states that she currently is not in a pain clinic but has been taking Vicodin at home. She states that she has a family friend who is a physician that has been prescribing her pain medication but she won't tell me what it is or who the physician is. On review of the [place] data base, and she got a prescription for hydromorphone ampules at the end of March. This was for 20 ampules. She has multiple injection granulomas and scarring in her right buttocks. She requested pain medication here but I did not feel comfortable giving her more injections. She was given a dose of oral Vicodin and was instructed to followup with her doctor for ongoing pain management        Rolan Bucco, MD 05/24/12 2119

## 2012-05-24 NOTE — ED Notes (Signed)
Pt states she is in a lot of pain and is requesting something. Pt reports "the only thing that works for me is 2mg  dilaudid usually IM and phenergan". Will notify MD of pt request.

## 2012-05-24 NOTE — ED Notes (Signed)
C/o cramping to hands and feet started this am-also c/o she fell approx 2hrs ago-pain to right hip

## 2012-06-08 ENCOUNTER — Other Ambulatory Visit: Payer: Self-pay

## 2012-06-11 ENCOUNTER — Other Ambulatory Visit: Payer: Self-pay | Admitting: *Deleted

## 2012-06-11 MED ORDER — DULOXETINE HCL 60 MG PO CPEP: 60.0000 mg | ORAL_CAPSULE | Freq: Two times a day (BID) | ORAL | Status: DC

## 2012-06-13 ENCOUNTER — Other Ambulatory Visit: Payer: Self-pay | Admitting: *Deleted

## 2012-06-13 MED ORDER — CLONAZEPAM 1 MG PO TABS: ORAL_TABLET | ORAL | Status: DC

## 2012-06-13 NOTE — Telephone Encounter (Signed)
Faxed script bck to [facility].../lmb 

## 2012-06-14 ENCOUNTER — Other Ambulatory Visit: Payer: Self-pay | Admitting: Internal Medicine

## 2012-06-15 ENCOUNTER — Other Ambulatory Visit: Payer: Self-pay | Admitting: Lab

## 2012-06-15 ENCOUNTER — Ambulatory Visit: Payer: Self-pay | Admitting: Oncology

## 2012-06-15 ENCOUNTER — Telehealth: Payer: Self-pay | Admitting: *Deleted

## 2012-06-15 NOTE — Telephone Encounter (Signed)
Received call from pt this am stating that she has a "bug" with n/v & diarrhea & fever & can't make appt today & will need to r/s.

## 2012-06-19 ENCOUNTER — Telehealth: Payer: Self-pay | Admitting: Oncology

## 2012-06-19 ENCOUNTER — Other Ambulatory Visit: Payer: Self-pay | Admitting: Oncology

## 2012-06-19 DIAGNOSIS — C50919 Malignant neoplasm of unspecified site of unspecified female breast: Secondary | ICD-10-CM

## 2012-06-19 DIAGNOSIS — M329 Systemic lupus erythematosus, unspecified: Secondary | ICD-10-CM

## 2012-06-19 NOTE — Telephone Encounter (Signed)
Pt called and wants to be seen by MD soon , emailed Dr Reece Agar , waiting for answer

## 2012-06-20 ENCOUNTER — Telehealth: Payer: Self-pay | Admitting: *Deleted

## 2012-06-20 NOTE — Telephone Encounter (Signed)
Patient had called scheduler to r/s missed appt. On 5/9.  Scheduler is asking Dr. Cyndie Chime for direction on this.  Pt. Is now scheduled for a CT abd/pelvis on 5/19.  Pt. States her last one was in January 2014 and wonders if Dr. Cyndie Chime would like her to have a PET instead?   It has been one year since her last pet. Will discuss with Dr. Cyndie Chime and call her back [phone]

## 2012-06-21 ENCOUNTER — Other Ambulatory Visit: Payer: Self-pay | Admitting: Oncology

## 2012-06-21 DIAGNOSIS — C50919 Malignant neoplasm of unspecified site of unspecified female breast: Secondary | ICD-10-CM

## 2012-06-22 ENCOUNTER — Telehealth: Payer: Self-pay | Admitting: Oncology

## 2012-06-25 ENCOUNTER — Ambulatory Visit (HOSPITAL_COMMUNITY): Payer: Medicare Other

## 2012-06-27 ENCOUNTER — Telehealth: Payer: Self-pay | Admitting: *Deleted

## 2012-06-27 ENCOUNTER — Other Ambulatory Visit: Payer: Self-pay | Admitting: Lab

## 2012-06-27 ENCOUNTER — Ambulatory Visit: Payer: Self-pay | Admitting: Nurse Practitioner

## 2012-06-27 ENCOUNTER — Other Ambulatory Visit (HOSPITAL_COMMUNITY): Payer: Self-pay

## 2012-06-27 ENCOUNTER — Encounter (HOSPITAL_COMMUNITY): Payer: Medicare Other

## 2012-06-27 NOTE — Telephone Encounter (Signed)
Patient called to let us know she had cancelled her PET scheduled for today.  She has a temp of 103.6, her arm is swollen, her back is swollen and she thinks she has cellulitis.   She has a call into her infectious disease doctor in [facility], Dr. Lala Lund(? She is unsure of spelling).  She hopes to speak with him this afternoon.   Let her know that she needs to not wait for a phone call back that may or may not happen.  She needs to go to the ED right now.  She says she took vicodin and E-STylenol and her temp is down to 102.  Her friend is on her way over and will take her to the ED.  Offered to call 911 for her, but she said no, her friend was on the over.  She will go to [facility], as Dr. Lala Lund?sp) is there working today.   (Reviwed this with Dr. Cyndie Chime).

## 2012-07-04 ENCOUNTER — Other Ambulatory Visit: Payer: Self-pay | Admitting: *Deleted

## 2012-07-04 MED ORDER — POTASSIUM CHLORIDE CRYS ER 20 MEQ PO TBCR: 20.0000 meq | EXTENDED_RELEASE_TABLET | Freq: Two times a day (BID) | ORAL | Status: DC

## 2012-07-09 ENCOUNTER — Ambulatory Visit: Payer: Self-pay | Admitting: Internal Medicine

## 2012-07-12 ENCOUNTER — Encounter (HOSPITAL_COMMUNITY): Payer: Medicare Other

## 2012-07-16 ENCOUNTER — Other Ambulatory Visit: Payer: Self-pay | Admitting: Lab

## 2012-07-16 ENCOUNTER — Ambulatory Visit: Payer: Self-pay | Admitting: Nurse Practitioner

## 2012-07-17 ENCOUNTER — Other Ambulatory Visit: Payer: Self-pay | Admitting: Internal Medicine

## 2012-07-19 ENCOUNTER — Other Ambulatory Visit: Payer: Self-pay | Admitting: Internal Medicine

## 2012-07-24 ENCOUNTER — Other Ambulatory Visit: Payer: Self-pay | Admitting: Internal Medicine

## 2012-07-29 ENCOUNTER — Encounter (HOSPITAL_COMMUNITY): Payer: Self-pay | Admitting: *Deleted

## 2012-07-29 ENCOUNTER — Emergency Department (HOSPITAL_COMMUNITY)
Admission: EM | Admit: 2012-07-29 | Discharge: 2012-07-30 | Disposition: A | Discharge status: Home / Self Care | Payer: Medicare Other | Attending: Emergency Medicine | Admitting: Emergency Medicine

## 2012-07-29 DIAGNOSIS — F609 Personality disorder, unspecified: Secondary | ICD-10-CM | POA: Insufficient documentation

## 2012-07-29 DIAGNOSIS — Z862 Personal history of diseases of the blood and blood-forming organs and certain disorders involving the immune mechanism: Secondary | ICD-10-CM | POA: Insufficient documentation

## 2012-07-29 DIAGNOSIS — M069 Rheumatoid arthritis, unspecified: Secondary | ICD-10-CM | POA: Insufficient documentation

## 2012-07-29 DIAGNOSIS — Z8672 Personal history of thrombophlebitis: Secondary | ICD-10-CM | POA: Insufficient documentation

## 2012-07-29 DIAGNOSIS — F411 Generalized anxiety disorder: Secondary | ICD-10-CM | POA: Insufficient documentation

## 2012-07-29 DIAGNOSIS — G8929 Other chronic pain: Secondary | ICD-10-CM | POA: Insufficient documentation

## 2012-07-29 DIAGNOSIS — E119 Type 2 diabetes mellitus without complications: Secondary | ICD-10-CM | POA: Insufficient documentation

## 2012-07-29 DIAGNOSIS — Z79899 Other long term (current) drug therapy: Secondary | ICD-10-CM | POA: Insufficient documentation

## 2012-07-29 DIAGNOSIS — Z8669 Personal history of other diseases of the nervous system and sense organs: Secondary | ICD-10-CM | POA: Insufficient documentation

## 2012-07-29 DIAGNOSIS — M1990 Unspecified osteoarthritis, unspecified site: Secondary | ICD-10-CM | POA: Insufficient documentation

## 2012-07-29 DIAGNOSIS — K219 Gastro-esophageal reflux disease without esophagitis: Secondary | ICD-10-CM | POA: Insufficient documentation

## 2012-07-29 DIAGNOSIS — G8911 Acute pain due to trauma: Secondary | ICD-10-CM | POA: Insufficient documentation

## 2012-07-29 DIAGNOSIS — F329 Major depressive disorder, single episode, unspecified: Secondary | ICD-10-CM | POA: Insufficient documentation

## 2012-07-29 DIAGNOSIS — Z872 Personal history of diseases of the skin and subcutaneous tissue: Secondary | ICD-10-CM | POA: Insufficient documentation

## 2012-07-29 DIAGNOSIS — IMO0002 Reserved for concepts with insufficient information to code with codable children: Secondary | ICD-10-CM

## 2012-07-29 DIAGNOSIS — M329 Systemic lupus erythematosus, unspecified: Secondary | ICD-10-CM | POA: Insufficient documentation

## 2012-07-29 DIAGNOSIS — G43909 Migraine, unspecified, not intractable, without status migrainosus: Secondary | ICD-10-CM | POA: Insufficient documentation

## 2012-07-29 DIAGNOSIS — F3289 Other specified depressive episodes: Secondary | ICD-10-CM | POA: Insufficient documentation

## 2012-07-29 DIAGNOSIS — M79609 Pain in unspecified limb: Secondary | ICD-10-CM | POA: Insufficient documentation

## 2012-07-29 DIAGNOSIS — Z8679 Personal history of other diseases of the circulatory system: Secondary | ICD-10-CM | POA: Insufficient documentation

## 2012-07-29 DIAGNOSIS — Z853 Personal history of malignant neoplasm of breast: Secondary | ICD-10-CM | POA: Insufficient documentation

## 2012-07-29 DIAGNOSIS — Z8505 Personal history of malignant neoplasm of liver: Secondary | ICD-10-CM | POA: Insufficient documentation

## 2012-07-29 DIAGNOSIS — Z8619 Personal history of other infectious and parasitic diseases: Secondary | ICD-10-CM | POA: Insufficient documentation

## 2012-07-29 DIAGNOSIS — G40909 Epilepsy, unspecified, not intractable, without status epilepticus: Secondary | ICD-10-CM | POA: Insufficient documentation

## 2012-07-29 DIAGNOSIS — Z8639 Personal history of other endocrine, nutritional and metabolic disease: Secondary | ICD-10-CM | POA: Insufficient documentation

## 2012-07-29 NOTE — ED Provider Notes (Signed)
History     CSN: [id number]  Arrival date & time 07/29/12  2120   First MD Initiated Contact with Patient 07/29/12 2317      Chief Complaint  Patient presents with  . Wound Check    (Consider location/radiation/quality/duration/timing/severity/associated sxs/prior treatment) HPI Patient presents emergency department with pain in multiple areas.  Patient, states she had an abscess drained in her right upper arm and pain around that area, states this was drained 8 days ago.  Patient, states, that she was having pain in her, and left forearm.  Patient has history of chronic pain.  Patient, states, that she does not have chest pain, shortness breath, nausea, vomiting, fever, weakness, dizzy, blurred vision, abdominal pain, back pain, or syncope.  Patient, states she's taking hydrocodone without relief of her symptoms. Past Medical History  Diagnosis Date  . PERSONALITY DISORDER   . Chronic pain syndrome     chronic narcotics, dependancy hx - [place] for pain mgmt  . MIGRAINE HEADACHE   . Lyme disease   . ALLERGIC RHINITIS   . ANEMIA-IRON DEFICIENCY   . HYPERLIPIDEMIA   . GERD   . DIABETES MELLITUS, TYPE II   . DEPRESSION   . SLE (systemic lupus erythematosus)     [place] [place] - chronic pred  . Psoriasis   . Addison's disease   . Degenerative joint disease   . DDD (degenerative disc disease)   . Phlebitis after infusion     left ?calf; "S/P phenergan/demerol injection"  . Blood clot in vein 2011; 02/04/11    "right jugular vein;left jugular vein"  . Hypothyroidism   . SEIZURE DISORDER     "lots of seizures; due to lupus"  . Anxiety   . Neuropathy     hands, feet, due to diabetes & back problem  . Surgical wound infection 02/15/2011    R shoulder and mastectomy site - related to PICC  . CARCINOMA, BREAST 08/2009 dx    R breast,  mets to liver - resolution s/p chemo, s/p B mastect  . Metastases to the liver   . RA (rheumatoid arthritis)     Past Surgical History    Procedure Laterality Date  . Liver bopsy  09/2009  . Port a cath placement  09/2009; 11/19/10  . Port-a-cath removal  04/2010    "due to staph & strept"  . Port-a-cath removal  12/23/2010    Procedure: REMOVAL PORT-A-CATH;  Surgeon: Valarie Merino, MD;  Location: [facility];  Service: General;  Laterality: Left;  . Breast surgery  09/2009    right breast biopsy  . Mastectomy  11/19/10    bilateral mastectomy   . I&d extremity  ~ 2010    right hand; S/P "dog bite"  . Peripherally inserted central catheter insertion    . Spider bite      Family History  Problem Relation Age of Onset  . Lung cancer Mother   . Lung cancer Father   . Arthritis Other   . Diabetes Other   . Hyperlipidemia Other     History  Substance Use Topics  . Smoking status: Never Smoker   . Smokeless tobacco: Never Used  . Alcohol Use: No    OB History   Grav Para Term Preterm Abortions TAB SAB Ect Mult Living                  Review of Systems All other systems negative except as documented in the HPI. All pertinent  positives and negatives as reviewed in the HPI. Allergies  Ammonia; Contrast media; Iohexol; Methotrexate; Midazolam hcl; Nalbuphine; Cephalexin; Infliximab; Ketorolac tromethamine; Nsaids; Ultram; and Celecoxib  Home Medications   Current Outpatient Rx  Name  Route  Sig  Dispense  Refill  . acetaminophen (TYLENOL) 325 MG tablet   Oral   Take 650 mg by mouth every 6 (six) hours as needed for pain or fever.         . ALPRAZolam (XANAX) 1 MG tablet   Oral   Take 1 tablet by mouth 2 (two) times daily as needed.         . butalbital-acetaminophen-caffeine (FIORICET, ESGIC) 50-325-40 MG per tablet   Oral   Take 1 tablet by mouth 2 (two) times daily as needed for headache.         . carisoprodol (SOMA) 350 MG tablet   Oral   Take 1 tablet (350 mg total) by mouth 3 (three) times daily as needed for muscle spasms. Muscle pain   30 tablet   1   . clonazePAM (KLONOPIN) 1 MG  tablet   Oral   Take 1 mg by mouth 2 (two) times daily as needed for anxiety.         . docusate sodium (COLACE) 100 MG capsule   Oral   Take 200 mg by mouth 2 (two) times daily.          . DULoxetine (CYMBALTA) 60 MG capsule   Oral   Take 1 capsule (60 mg total) by mouth 2 (two) times daily.   180 capsule   1   . esomeprazole (NEXIUM) 40 MG capsule   Oral   Take 40 mg by mouth daily before breakfast.         . furosemide (LASIX) 20 MG tablet   Oral   Take 20 mg by mouth 2 (two) times daily as needed. Take 1 tablet every morning and take 1 additional tablet at night as needed for swelling         . gabapentin (NEURONTIN) 300 MG capsule   Oral   Take 3 capsules (900 mg total) by mouth 3 (three) times daily.   810 capsule   0   . hydrochlorothiazide (HYDRODIURIL) 25 MG tablet   Oral   Take 25 mg by mouth every morning.         Marland Kitchen HYDROcodone-acetaminophen (NORCO) 10-325 MG per tablet   Oral   Take 1-2 tablets by mouth every 6 (six) hours as needed for pain.   112 tablet   0     DO NOT FILL UNTIL UNTIL 03/20/12.   . magnesium hydroxide (MILK OF MAGNESIA) 400 MG/5ML suspension   Oral   Take by mouth daily as needed for constipation.         . potassium chloride SA (K-DUR,KLOR-CON) 20 MEQ tablet   Oral   Take 1 tablet (20 mEq total) by mouth 2 (two) times daily. Take 1 by mouth twice a day   60 tablet   5   . predniSONE (DELTASONE) 10 MG tablet   Oral   Take 10 mg by mouth every morning.         . Probiotic Product (PROBIOTIC FORMULA) CAPS   Oral   Take 1 capsule by mouth every morning.          . promethazine (PHENERGAN) 25 MG tablet   Oral   Take 25 mg by mouth every 6 (six) hours as needed for nausea.         Marland Kitchen  promethazine (PHENERGAN) 25 MG/ML injection   Intramuscular   Inject 25 mg into the muscle once as needed for nausea or vomiting.         Marland Kitchen rOPINIRole (REQUIP) 1 MG tablet   Oral   Take 1 mg by mouth at bedtime.         Marland Kitchen  spironolactone-hydrochlorothiazide (ALDACTAZIDE) 25-25 MG per tablet   Oral   Take 1 tablet by mouth 2 (two) times daily. Take 1 by mouth twice a day         . traZODone (DESYREL) 150 MG tablet   Oral   Take 150 mg by mouth at bedtime.           BP 118/65  Pulse 82  Temp(Src) 98.3 F (36.8 C) (Oral)  Resp 20  SpO2 100%  Physical Exam  Nursing note and vitals reviewed. Constitutional: She is oriented to person, place, and time. She appears well-developed and well-nourished.  HENT:  Head: Normocephalic and atraumatic.  Eyes: Pupils are equal, round, and reactive to light.  Neck: Normal range of motion. Neck supple.  Cardiovascular: Normal rate, regular rhythm and normal heart sounds.  Exam reveals no gallop and no friction rub.   No murmur heard. Pulmonary/Chest: Effort normal and breath sounds normal. No respiratory distress.  Musculoskeletal:       Arms:      Hands: Neurological: She is alert and oriented to person, place, and time.  Skin: Skin is warm and dry. No rash noted.    ED Course  Procedures (including critical care time)  The patient is referred back to Dr. that she saw for the I&D of the abscess.  Patient is a rest return here as needed.  Patient requested 4 mg of Dilaudid.  Advised her that we would not be able to provide that.  Patient does not have any signs of significant infection at this time.  MDM          Carlyle Dolly, PA-C 07/30/12 [phone]

## 2012-07-29 NOTE — ED Notes (Signed)
Pt had surgery on upper post left arm due to spider bite, painful around site with slight redness, also has lumps on left lower arm

## 2012-07-30 MED ORDER — HYDROMORPHONE HCL PF 2 MG/ML IJ SOLN
2.0000 mg | Freq: Once | INTRAMUSCULAR | Status: AC
  Administered 2012-07-30: 2 mg via INTRAMUSCULAR
  Filled 2012-07-30: qty 1

## 2012-07-30 MED ORDER — PERCOCET 5-325 MG PO TABS: 1.0000 | ORAL_TABLET | Freq: Four times a day (QID) | ORAL | Status: DC | PRN

## 2012-07-30 NOTE — ED Provider Notes (Signed)
Medical screening examination/treatment/procedure(s) were performed by non-physician practitioner and as supervising physician I was immediately available for consultation/collaboration.   Alayzha An L Oree Hislop, MD 07/30/12 0742 

## 2012-07-31 ENCOUNTER — Other Ambulatory Visit: Payer: Self-pay | Admitting: *Deleted

## 2012-07-31 ENCOUNTER — Telehealth: Payer: Self-pay | Admitting: *Deleted

## 2012-07-31 NOTE — Telephone Encounter (Signed)
Received call from pt stating that she is out of the hospital & would like to r/s her PET & MD appt for sometime in July.  Note to Dr Cyndie Chime.

## 2012-08-01 ENCOUNTER — Ambulatory Visit: Payer: Medicare Other | Admitting: Internal Medicine

## 2012-08-02 ENCOUNTER — Other Ambulatory Visit: Payer: Self-pay | Admitting: Oncology

## 2012-08-02 DIAGNOSIS — C50919 Malignant neoplasm of unspecified site of unspecified female breast: Secondary | ICD-10-CM

## 2012-08-15 ENCOUNTER — Other Ambulatory Visit: Payer: Self-pay | Admitting: Lab

## 2012-08-15 ENCOUNTER — Encounter (HOSPITAL_COMMUNITY): Payer: Medicare Other

## 2012-08-17 ENCOUNTER — Encounter (HOSPITAL_COMMUNITY): Payer: Medicare Other

## 2012-08-17 ENCOUNTER — Other Ambulatory Visit: Payer: Self-pay | Admitting: Lab

## 2012-08-17 ENCOUNTER — Telehealth: Payer: Self-pay | Admitting: *Deleted

## 2012-08-17 NOTE — Telephone Encounter (Signed)
Received message from pt stating "I had to cancel my PET scan this am because I was sick on my stomach and ate something this morning before I remembered that I wasn't suppose to eat"  Pt stated she was going to r/s PET and wanted to know if she needed to r/s appt with LT 08/22/12?  Per Lonna Cobb, pt would need to r/s 7/16 appt.  Left message on pt home # to call office regarding this information.  Note to Dr. Cyndie Chime.

## 2012-08-21 ENCOUNTER — Other Ambulatory Visit: Payer: Self-pay | Admitting: Internal Medicine

## 2012-08-21 ENCOUNTER — Telehealth: Payer: Self-pay | Admitting: *Deleted

## 2012-08-21 NOTE — Telephone Encounter (Signed)
Patient left message that she still has the appointment with Misty Stanley for tomorrow 7/16.  She has not had the MRI.  Called and left her a message.  I will go ahead and cancel appointment with Misty Stanley for 7/16 and she (per her note on 7/11) will call and get PET rescheduled.  She is to let us know as soon as she has a date for the PET so we can get her on Lisa's schedule ASAP after PET.

## 2012-08-22 ENCOUNTER — Ambulatory Visit: Payer: Self-pay | Admitting: Nurse Practitioner

## 2012-08-28 ENCOUNTER — Other Ambulatory Visit: Payer: Self-pay | Admitting: Internal Medicine

## 2012-08-30 ENCOUNTER — Other Ambulatory Visit (HOSPITAL_COMMUNITY): Payer: Self-pay

## 2012-08-30 ENCOUNTER — Inpatient Hospital Stay
Admission: AD | Admit: 2012-08-30 | Discharge: 2012-09-19 | Disposition: A | Discharge status: Home Health | Payer: Self-pay | Source: Ambulatory Visit | Attending: Internal Medicine | Admitting: Internal Medicine

## 2012-08-30 ENCOUNTER — Encounter: Payer: Self-pay | Admitting: Radiology

## 2012-08-30 LAB — RAPID URINE DRUG SCREEN, HOSP PERFORMED
Amphetamines: NOT DETECTED
Cocaine: NOT DETECTED
Opiates: NOT DETECTED
Tetrahydrocannabinol: NOT DETECTED

## 2012-08-30 LAB — BLOOD GAS, ARTERIAL
Acid-Base Excess: 4.8 mmol/L — ABNORMAL HIGH (ref 0.0–2.0)
TCO2: 29.1 mmol/L (ref 0–100)
pCO2 arterial: 35.6 mmHg (ref 35.0–45.0)

## 2012-08-31 LAB — COMPREHENSIVE METABOLIC PANEL
ALT: 39 U/L — ABNORMAL HIGH (ref 0–35)
AST: 20 U/L (ref 0–37)
Albumin: 2.8 g/dL — ABNORMAL LOW (ref 3.5–5.2)
Alkaline Phosphatase: 94 U/L (ref 39–117)
CO2: 27 mEq/L (ref 19–32)
Chloride: 102 mEq/L (ref 96–112)
GFR calc non Af Amer: 77 mL/min — ABNORMAL LOW (ref >90)
Potassium: 3.6 mEq/L (ref 3.5–5.1)
Total Bilirubin: 0.1 mg/dL — ABNORMAL LOW (ref 0.3–1.2)

## 2012-08-31 LAB — CBC WITH DIFFERENTIAL/PLATELET
Basophils Absolute: 0 10*3/uL (ref 0.0–0.1)
Basophils Relative: 0 % (ref 0–1)
HCT: 26.9 % — ABNORMAL LOW (ref 36.0–46.0)
Hemoglobin: 8.2 g/dL — ABNORMAL LOW (ref 12.0–15.0)
Lymphocytes Relative: 22 % (ref 12–46)
MCHC: 30.5 g/dL (ref 30.0–36.0)
Neutro Abs: 6.1 10*3/uL (ref 1.7–7.7)
Neutrophils Relative %: 69 % (ref 43–77)
RDW: 16.9 % — ABNORMAL HIGH (ref 11.5–15.5)
WBC: 8.9 10*3/uL (ref 4.0–10.5)

## 2012-08-31 LAB — VITAMIN B12: Vitamin B-12: 447 pg/mL (ref 211–911)

## 2012-08-31 LAB — VANCOMYCIN, TROUGH: Vancomycin Tr: 13.6 ug/mL (ref 10.0–20.0)

## 2012-08-31 LAB — TSH: TSH: 3.188 u[IU]/mL (ref 0.350–4.500)

## 2012-08-31 LAB — IRON AND TIBC: UIBC: 288 ug/dL (ref 125–400)

## 2012-09-01 ENCOUNTER — Telehealth (HOSPITAL_COMMUNITY): Payer: Self-pay | Admitting: Licensed Clinical Social Worker

## 2012-09-01 LAB — VANCOMYCIN, TROUGH: Vancomycin Tr: 17.2 ug/mL (ref 10.0–20.0)

## 2012-09-01 LAB — SEDIMENTATION RATE: Sed Rate: 40 mm/hr — ABNORMAL HIGH (ref 0–22)

## 2012-09-02 LAB — CBC
Hemoglobin: 8.6 g/dL — ABNORMAL LOW (ref 12.0–15.0)
MCHC: 30.1 g/dL (ref 30.0–36.0)
RDW: 16.7 % — ABNORMAL HIGH (ref 11.5–15.5)
WBC: 7.8 10*3/uL (ref 4.0–10.5)

## 2012-09-02 LAB — BASIC METABOLIC PANEL
GFR calc Af Amer: 86 mL/min — ABNORMAL LOW (ref >90)
GFR calc non Af Amer: 75 mL/min — ABNORMAL LOW (ref >90)
Potassium: 3.4 mEq/L — ABNORMAL LOW (ref 3.5–5.1)
Sodium: 137 mEq/L (ref 135–145)

## 2012-09-02 LAB — CLOSTRIDIUM DIFFICILE BY PCR: Toxigenic C. Difficile by PCR: NEGATIVE

## 2012-09-03 ENCOUNTER — Telehealth: Payer: Self-pay | Admitting: *Deleted

## 2012-09-03 LAB — BASIC METABOLIC PANEL
Chloride: 101 mEq/L (ref 96–112)
GFR calc Af Amer: 76 mL/min — ABNORMAL LOW (ref >90)
Potassium: 3.7 mEq/L (ref 3.5–5.1)

## 2012-09-03 LAB — FOLATE RBC: RBC Folate: 687 ng/mL — ABNORMAL HIGH (ref >=366)

## 2012-09-03 NOTE — Telephone Encounter (Signed)
Noted thanks °

## 2012-09-03 NOTE — Telephone Encounter (Signed)
Pt called states she has to cancel her appoint for 7.30.14 due to being admitted to [facility] s/p surgery from spider bite.  Pt is receiving antibiotics and has a wound vac.  Pt may be reached in room at [phone]

## 2012-09-04 ENCOUNTER — Telehealth: Payer: Self-pay | Admitting: *Deleted

## 2012-09-04 NOTE — Telephone Encounter (Addendum)
Received message from pt wanting Dr. Cyndie Chime to know that pt's at [facility] getting long-term antibiotics and wound-vac due to a spider bite"  Pt states she's had 2 "operations" and she is not trying to be "non-compliant" about re-scheduling her PET/MD appts and states "I'm going to ask if they can do a PET scan here" Invited Dr. Cyndie Chime to visit her in room 5729.  Note to MD.

## 2012-09-05 ENCOUNTER — Ambulatory Visit: Payer: Self-pay | Admitting: Internal Medicine

## 2012-09-05 LAB — WOUND CULTURE: Culture: NO GROWTH

## 2012-09-06 ENCOUNTER — Other Ambulatory Visit (HOSPITAL_COMMUNITY): Payer: Self-pay

## 2012-09-06 LAB — CBC
HCT: 28.4 % — ABNORMAL LOW (ref 36.0–46.0)
MCV: 77.4 fL — ABNORMAL LOW (ref 78.0–100.0)
Platelets: 463 10*3/uL — ABNORMAL HIGH (ref 150–400)
RBC: 3.67 MIL/uL — ABNORMAL LOW (ref 3.87–5.11)
WBC: 9.7 10*3/uL (ref 4.0–10.5)

## 2012-09-06 LAB — BASIC METABOLIC PANEL
CO2: 29 mEq/L (ref 19–32)
Chloride: 101 mEq/L (ref 96–112)
Creatinine, Ser: 0.79 mg/dL (ref 0.50–1.10)

## 2012-09-08 LAB — BASIC METABOLIC PANEL
BUN: 14 mg/dL (ref 6–23)
CO2: 28 mEq/L (ref 19–32)
Chloride: 100 mEq/L (ref 96–112)
Creatinine, Ser: 0.91 mg/dL (ref 0.50–1.10)
Glucose, Bld: 109 mg/dL — ABNORMAL HIGH (ref 70–99)

## 2012-09-08 LAB — CBC
HCT: 27.4 % — ABNORMAL LOW (ref 36.0–46.0)
Hemoglobin: 8.2 g/dL — ABNORMAL LOW (ref 12.0–15.0)
MCV: 77.8 fL — ABNORMAL LOW (ref 78.0–100.0)
RBC: 3.52 MIL/uL — ABNORMAL LOW (ref 3.87–5.11)
RDW: 16.4 % — ABNORMAL HIGH (ref 11.5–15.5)
WBC: 5.1 10*3/uL (ref 4.0–10.5)

## 2012-09-11 LAB — COMPREHENSIVE METABOLIC PANEL
BUN: 12 mg/dL (ref 6–23)
CO2: 30 mEq/L (ref 19–32)
Calcium: 9.3 mg/dL (ref 8.4–10.5)
GFR calc Af Amer: 75 mL/min — ABNORMAL LOW (ref >90)
GFR calc non Af Amer: 64 mL/min — ABNORMAL LOW (ref >90)
Glucose, Bld: 141 mg/dL — ABNORMAL HIGH (ref 70–99)
Total Protein: 6.5 g/dL (ref 6.0–8.3)

## 2012-09-11 LAB — CBC
HCT: 28.9 % — ABNORMAL LOW (ref 36.0–46.0)
Hemoglobin: 8.8 g/dL — ABNORMAL LOW (ref 12.0–15.0)
MCH: 23.9 pg — ABNORMAL LOW (ref 26.0–34.0)
MCHC: 30.4 g/dL (ref 30.0–36.0)
MCV: 78.5 fL (ref 78.0–100.0)

## 2012-09-12 LAB — BASIC METABOLIC PANEL
CO2: 33 mEq/L — ABNORMAL HIGH (ref 19–32)
Calcium: 9.4 mg/dL (ref 8.4–10.5)
Chloride: 100 mEq/L (ref 96–112)
GFR calc Af Amer: 82 mL/min — ABNORMAL LOW (ref >90)
Sodium: 141 mEq/L (ref 135–145)

## 2012-09-12 LAB — VANCOMYCIN, TROUGH: Vancomycin Tr: 28.8 ug/mL (ref 10.0–20.0)

## 2012-09-14 LAB — CBC
Hemoglobin: 8.1 g/dL — ABNORMAL LOW (ref 12.0–15.0)
MCH: 24 pg — ABNORMAL LOW (ref 26.0–34.0)
MCHC: 30.2 g/dL (ref 30.0–36.0)
Platelets: 254 10*3/uL (ref 150–400)
RBC: 3.38 MIL/uL — ABNORMAL LOW (ref 3.87–5.11)

## 2012-09-14 LAB — VANCOMYCIN, TROUGH
Vancomycin Tr: 22.5 ug/mL — ABNORMAL HIGH (ref 10.0–20.0)
Vancomycin Tr: 13.7 ug/mL (ref 10.0–20.0)

## 2012-09-14 LAB — BASIC METABOLIC PANEL
Creatinine, Ser: 0.92 mg/dL (ref 0.50–1.10)
GFR calc Af Amer: 79 mL/min — ABNORMAL LOW (ref >90)
Glucose, Bld: 95 mg/dL (ref 70–99)
Potassium: 3.4 mEq/L — ABNORMAL LOW (ref 3.5–5.1)
Sodium: 140 mEq/L (ref 135–145)

## 2012-09-15 ENCOUNTER — Other Ambulatory Visit (HOSPITAL_COMMUNITY): Payer: Self-pay

## 2012-09-17 ENCOUNTER — Other Ambulatory Visit (HOSPITAL_COMMUNITY): Payer: Self-pay

## 2012-09-17 ENCOUNTER — Other Ambulatory Visit: Payer: Self-pay | Admitting: Internal Medicine

## 2012-09-17 LAB — COMPREHENSIVE METABOLIC PANEL
ALT: 24 U/L (ref 0–35)
AST: 21 U/L (ref 0–37)
Albumin: 3.1 g/dL — ABNORMAL LOW (ref 3.5–5.2)
Alkaline Phosphatase: 71 U/L (ref 39–117)
BUN: 16 mg/dL (ref 6–23)
Chloride: 99 mEq/L (ref 96–112)
Potassium: 3.3 mEq/L — ABNORMAL LOW (ref 3.5–5.1)
Sodium: 140 mEq/L (ref 135–145)
Total Bilirubin: 0.2 mg/dL — ABNORMAL LOW (ref 0.3–1.2)
Total Protein: 6.4 g/dL (ref 6.0–8.3)

## 2012-09-17 LAB — CBC WITH DIFFERENTIAL/PLATELET
Basophils Absolute: 0 10*3/uL (ref 0.0–0.1)
Basophils Relative: 0 % (ref 0–1)
Eosinophils Absolute: 0.2 10*3/uL (ref 0.0–0.7)
Hemoglobin: 8.7 g/dL — ABNORMAL LOW (ref 12.0–15.0)
MCH: 23.6 pg — ABNORMAL LOW (ref 26.0–34.0)
MCHC: 29.8 g/dL — ABNORMAL LOW (ref 30.0–36.0)
Neutro Abs: 4.6 10*3/uL (ref 1.7–7.7)
Neutrophils Relative %: 62 % (ref 43–77)
Platelets: 255 10*3/uL (ref 150–400)
RDW: 17.7 % — ABNORMAL HIGH (ref 11.5–15.5)

## 2012-09-17 LAB — VANCOMYCIN, TROUGH: Vancomycin Tr: 10.8 ug/mL (ref 10.0–20.0)

## 2012-09-17 LAB — PREALBUMIN: Prealbumin: 28.9 mg/dL (ref 17.0–34.0)

## 2012-09-18 ENCOUNTER — Other Ambulatory Visit (HOSPITAL_COMMUNITY): Payer: Self-pay

## 2012-09-18 LAB — STOOL CULTURE

## 2012-09-18 LAB — POTASSIUM: Potassium: 3.8 mEq/L (ref 3.5–5.1)

## 2012-09-18 LAB — MAGNESIUM: Magnesium: 2.2 mg/dL (ref 1.5–2.5)

## 2012-09-19 LAB — BASIC METABOLIC PANEL
CO2: 34 mEq/L — ABNORMAL HIGH (ref 19–32)
Calcium: 9.5 mg/dL (ref 8.4–10.5)
GFR calc non Af Amer: 64 mL/min — ABNORMAL LOW (ref >90)
Potassium: 3.5 mEq/L (ref 3.5–5.1)
Sodium: 138 mEq/L (ref 135–145)

## 2012-09-19 LAB — CBC
Hemoglobin: 9.3 g/dL — ABNORMAL LOW (ref 12.0–15.0)
MCH: 23.5 pg — ABNORMAL LOW (ref 26.0–34.0)
Platelets: 284 10*3/uL (ref 150–400)
RBC: 3.96 MIL/uL (ref 3.87–5.11)

## 2012-09-19 LAB — IRON AND TIBC
Iron: 26 ug/dL — ABNORMAL LOW (ref 42–135)
Saturation Ratios: 7 % — ABNORMAL LOW (ref 20–55)
TIBC: 376 ug/dL (ref 250–470)

## 2012-10-10 ENCOUNTER — Ambulatory Visit: Payer: Self-pay | Admitting: Internal Medicine

## 2012-10-15 ENCOUNTER — Other Ambulatory Visit: Payer: Self-pay | Admitting: *Deleted

## 2012-10-15 MED ORDER — FUROSEMIDE 20 MG PO TABS: 20.0000 mg | ORAL_TABLET | Freq: Two times a day (BID) | ORAL | Status: DC | PRN

## 2012-10-16 ENCOUNTER — Telehealth: Payer: Self-pay | Admitting: Oncology

## 2012-10-16 ENCOUNTER — Telehealth: Payer: Self-pay | Admitting: *Deleted

## 2012-10-16 ENCOUNTER — Other Ambulatory Visit: Payer: Self-pay | Admitting: *Deleted

## 2012-10-16 NOTE — Telephone Encounter (Signed)
Received vm call from pt stating that she has been in the hospital & is home now [facility] is coming out every other day & she would like to r/s her PET scan & MD/NP appt. She mentioned that she has no veins & would like to talk about an avenue for the PET.  Returned call today & she states she doesn't have a way right now to get here b/c her friends car is out of order but thinks she could r/s PET/MD after 10/30/12 & in the pm.  She had a PICC in the hosp but this was taken out.  She is willing for the IV team to try to stick her for the PET.  Will send POF to schedulers to r/s appts.

## 2012-10-16 NOTE — Telephone Encounter (Signed)
S/w the pt and she is aware of her lab and pet scan appt in sept. The pt is aware of the oct f/u vist with the np.

## 2012-10-17 ENCOUNTER — Telehealth: Payer: Self-pay | Admitting: Internal Medicine

## 2012-10-17 ENCOUNTER — Ambulatory Visit: Payer: Self-pay | Admitting: Internal Medicine

## 2012-10-17 NOTE — Telephone Encounter (Signed)
Pt needs a letter to excuse her from [employer] on Oct 2.  She has an appt. Sept 26, but she needs the letter to send in10 days before Oct 2. She wants a call when ready.  If she can't come to pick it up, she will have [place] mail it to her/

## 2012-10-18 ENCOUNTER — Other Ambulatory Visit: Payer: Self-pay | Admitting: *Deleted

## 2012-10-18 ENCOUNTER — Encounter: Payer: Self-pay | Admitting: *Deleted

## 2012-10-18 MED ORDER — CLONAZEPAM 1 MG PO TABS: 1.0000 mg | ORAL_TABLET | Freq: Two times a day (BID) | ORAL | Status: DC | PRN

## 2012-10-18 NOTE — Telephone Encounter (Signed)
Notified pt need jury duty # [phone]. Pt is wanting letter mail since she does not have transportation. Inform her will mail...[name]

## 2012-10-18 NOTE — Telephone Encounter (Signed)
Faxed script back to [facility].../lmb 

## 2012-10-18 NOTE — Telephone Encounter (Signed)
Ok to create letter - thanks

## 2012-10-24 ENCOUNTER — Other Ambulatory Visit: Payer: Self-pay | Admitting: *Deleted

## 2012-10-24 MED ORDER — FUROSEMIDE 20 MG PO TABS: 20.0000 mg | ORAL_TABLET | Freq: Two times a day (BID) | ORAL | Status: DC | PRN

## 2012-11-01 ENCOUNTER — Ambulatory Visit: Payer: Self-pay | Admitting: Nurse Practitioner

## 2012-11-02 ENCOUNTER — Ambulatory Visit: Payer: Medicare Other | Admitting: Internal Medicine

## 2012-11-02 ENCOUNTER — Other Ambulatory Visit (HOSPITAL_COMMUNITY): Payer: Self-pay

## 2012-11-05 ENCOUNTER — Other Ambulatory Visit: Payer: Self-pay | Admitting: Lab

## 2012-11-05 ENCOUNTER — Encounter (HOSPITAL_COMMUNITY): Payer: Medicare Other

## 2012-11-07 ENCOUNTER — Telehealth: Payer: Self-pay | Admitting: *Deleted

## 2012-11-07 ENCOUNTER — Ambulatory Visit: Payer: Self-pay | Admitting: Nurse Practitioner

## 2012-11-07 NOTE — Telephone Encounter (Signed)
Received message from pt stating "I'm at [facility] now and will be transferred to [facility] because of my spider bite; will be getting IV antibiotics."  Pt stated she is cancelling today's appt with LT @ 2:45 and also stated she has not had PET scan done.  Lonna Cobb, NP notified of appt cancellation.  Note to Dr. Cyndie Chime.

## 2012-11-08 ENCOUNTER — Inpatient Hospital Stay
Admission: AD | Admit: 2012-11-08 | Discharge: 2012-11-21 | Disposition: A | Discharge status: Skilled Nursing Facility | Payer: Medicare Other | Source: Ambulatory Visit | Attending: Internal Medicine | Admitting: Internal Medicine

## 2012-11-09 LAB — COMPREHENSIVE METABOLIC PANEL
ALT: 33 U/L (ref 0–35)
AST: 24 U/L (ref 0–37)
Alkaline Phosphatase: 74 U/L (ref 39–117)
Calcium: 8.4 mg/dL (ref 8.4–10.5)
Chloride: 101 mEq/L (ref 96–112)
Potassium: 3.5 mEq/L (ref 3.5–5.1)
Sodium: 140 mEq/L (ref 135–145)
Total Protein: 5.9 g/dL — ABNORMAL LOW (ref 6.0–8.3)

## 2012-11-09 LAB — CBC WITH DIFFERENTIAL/PLATELET
Basophils Absolute: 0 10*3/uL (ref 0.0–0.1)
Basophils Relative: 0 % (ref 0–1)
Eosinophils Absolute: 0.1 10*3/uL (ref 0.0–0.7)
Eosinophils Relative: 2 % (ref 0–5)
Hemoglobin: 8.3 g/dL — ABNORMAL LOW (ref 12.0–15.0)
Lymphocytes Relative: 17 % (ref 12–46)
Lymphs Abs: 1.5 10*3/uL (ref 0.7–4.0)
MCH: 22.9 pg — ABNORMAL LOW (ref 26.0–34.0)
MCHC: 29.7 g/dL — ABNORMAL LOW (ref 30.0–36.0)
MCV: 77.1 fL — ABNORMAL LOW (ref 78.0–100.0)
Neutro Abs: 6.3 10*3/uL (ref 1.7–7.7)
Neutrophils Relative %: 72 % (ref 43–77)
Platelets: 305 10*3/uL (ref 150–400)
RDW: 18 % — ABNORMAL HIGH (ref 11.5–15.5)
WBC: 8.7 10*3/uL (ref 4.0–10.5)

## 2012-11-09 LAB — C-REACTIVE PROTEIN: CRP: 1.4 mg/dL — ABNORMAL HIGH (ref <0.60)

## 2012-11-09 LAB — MAGNESIUM: Magnesium: 1.7 mg/dL (ref 1.5–2.5)

## 2012-11-09 LAB — TSH: TSH: 1.782 u[IU]/mL (ref 0.350–4.500)

## 2012-11-09 LAB — PROCALCITONIN: Procalcitonin: <0.1 ng/mL

## 2012-11-09 LAB — SEDIMENTATION RATE: Sed Rate: 15 mm/hr (ref 0–22)

## 2012-11-09 LAB — FOLATE RBC: RBC Folate: 926 ng/mL — ABNORMAL HIGH (ref >=366)

## 2012-11-09 LAB — PREALBUMIN: Prealbumin: 26.6 mg/dL (ref 17.0–34.0)

## 2012-11-10 ENCOUNTER — Other Ambulatory Visit (HOSPITAL_COMMUNITY): Payer: Medicare Other

## 2012-11-11 LAB — CBC
Hemoglobin: 8.2 g/dL — ABNORMAL LOW (ref 12.0–15.0)
MCHC: 30.1 g/dL (ref 30.0–36.0)
Platelets: 286 10*3/uL (ref 150–400)
RDW: 18 % — ABNORMAL HIGH (ref 11.5–15.5)

## 2012-11-11 LAB — BASIC METABOLIC PANEL
GFR calc Af Amer: >90 mL/min (ref >90)
GFR calc non Af Amer: >90 mL/min (ref >90)
Potassium: 3 mEq/L — ABNORMAL LOW (ref 3.5–5.1)
Sodium: 144 mEq/L (ref 135–145)

## 2012-11-11 LAB — PRO B NATRIURETIC PEPTIDE: Pro B Natriuretic peptide (BNP): 462.1 pg/mL — ABNORMAL HIGH (ref 0–125)

## 2012-11-12 ENCOUNTER — Other Ambulatory Visit (HOSPITAL_COMMUNITY): Payer: Medicare Other

## 2012-11-12 LAB — CBC
HCT: 28 % — ABNORMAL LOW (ref 36.0–46.0)
MCH: 23.1 pg — ABNORMAL LOW (ref 26.0–34.0)
Platelets: 304 10*3/uL (ref 150–400)
RBC: 3.68 MIL/uL — ABNORMAL LOW (ref 3.87–5.11)
WBC: 9.4 10*3/uL (ref 4.0–10.5)

## 2012-11-12 LAB — BASIC METABOLIC PANEL
BUN: 15 mg/dL (ref 6–23)
CO2: 35 mEq/L — ABNORMAL HIGH (ref 19–32)
Calcium: 8.5 mg/dL (ref 8.4–10.5)
Chloride: 98 mEq/L (ref 96–112)
Glucose, Bld: 102 mg/dL — ABNORMAL HIGH (ref 70–99)
Sodium: 141 mEq/L (ref 135–145)

## 2012-11-12 MED ORDER — GADOBENATE DIMEGLUMINE 529 MG/ML IV SOLN
15.0000 mL | Freq: Once | INTRAVENOUS | Status: AC | PRN
  Administered 2012-11-12: 15 mL via INTRAVENOUS

## 2012-11-13 LAB — BASIC METABOLIC PANEL
Creatinine, Ser: 0.8 mg/dL (ref 0.50–1.10)
GFR calc non Af Amer: 80 mL/min — ABNORMAL LOW (ref >90)
Glucose, Bld: 114 mg/dL — ABNORMAL HIGH (ref 70–99)
Potassium: 3.8 mEq/L (ref 3.5–5.1)
Sodium: 144 mEq/L (ref 135–145)

## 2012-11-13 IMAGING — CR DG CHEST 2V
2 series · 2 of 2 positions shown · non-contrast
Comparison: 06/14/2010

CLINICAL DATA: pain.  Cough

CHEST - 2 VIEW

[w chest pa]
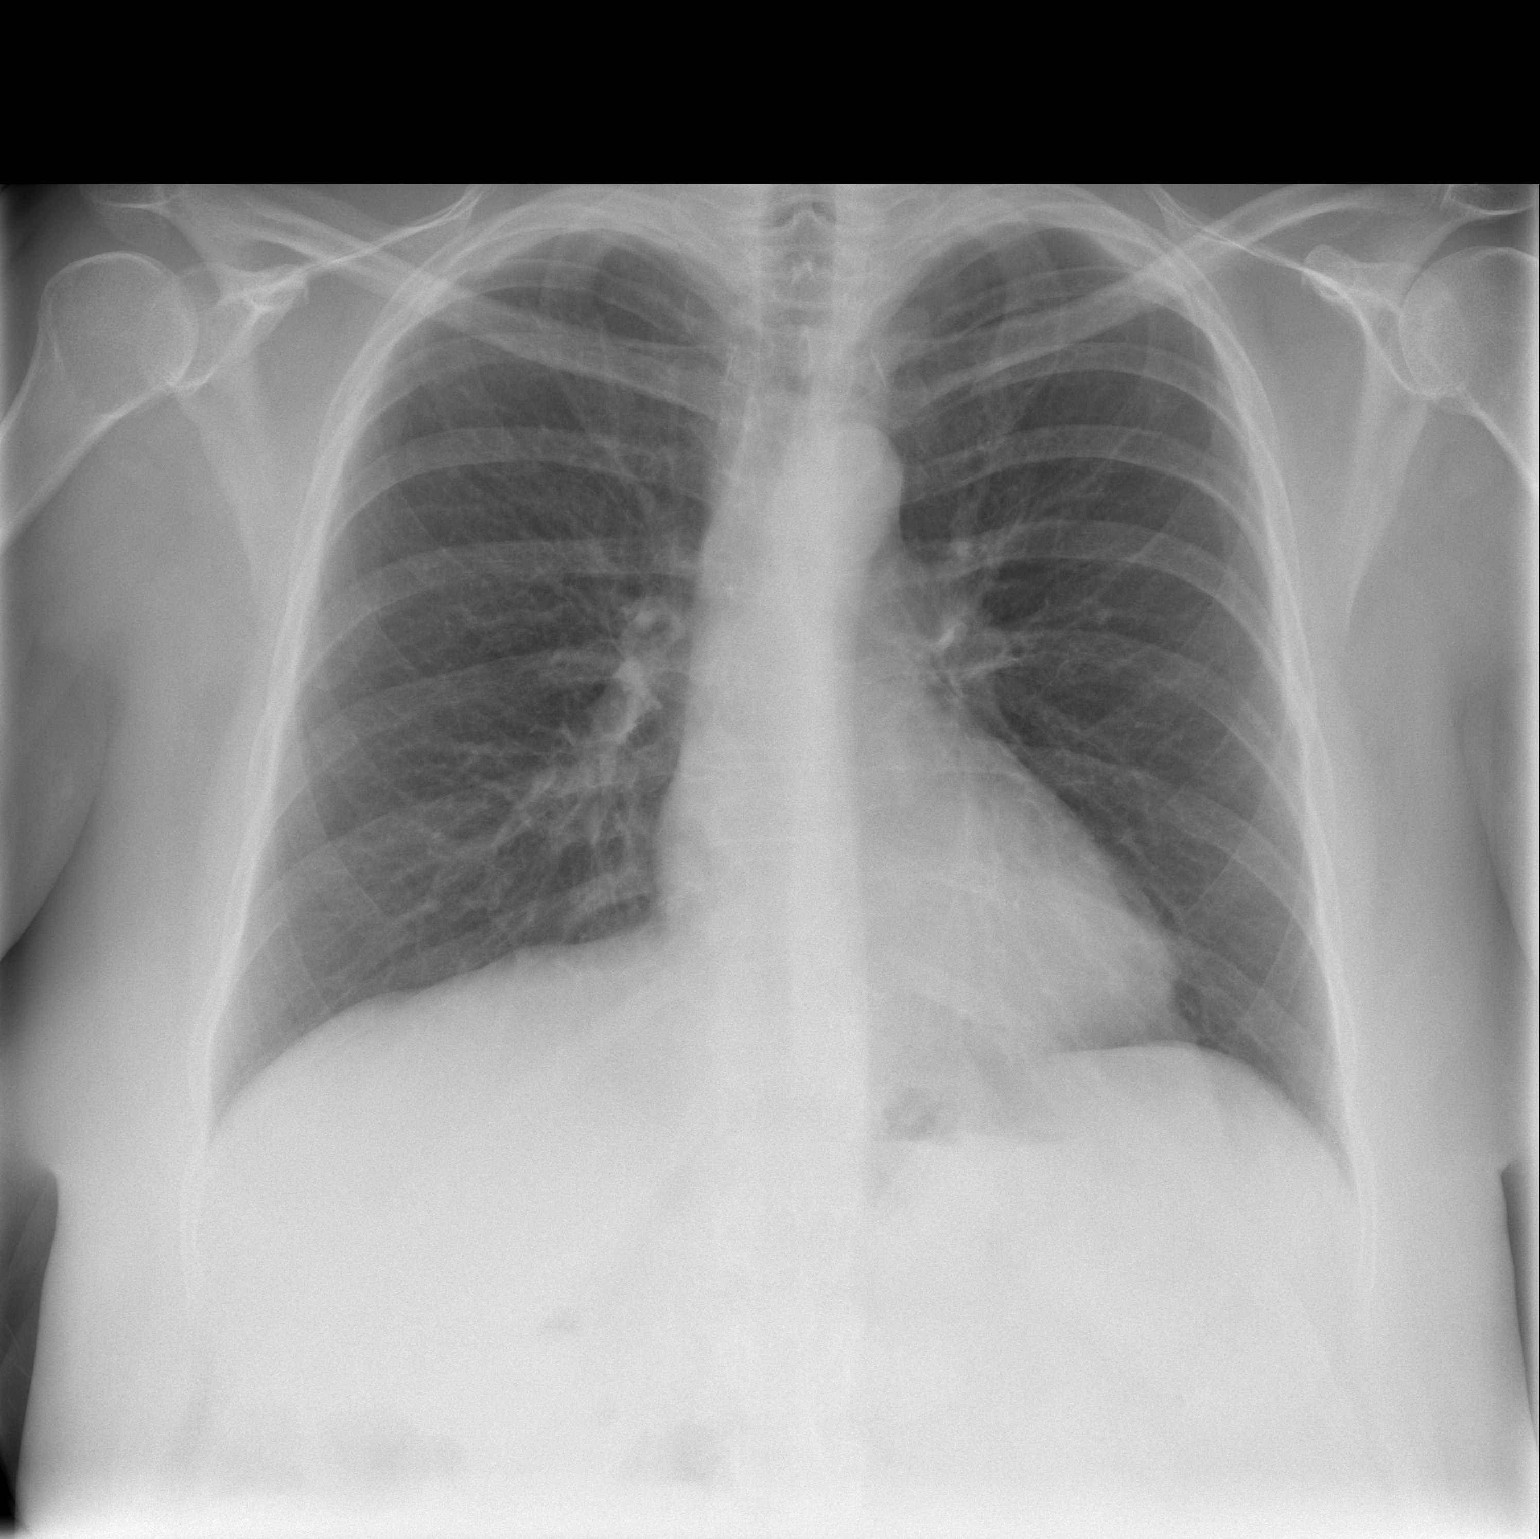

[w chest lat]
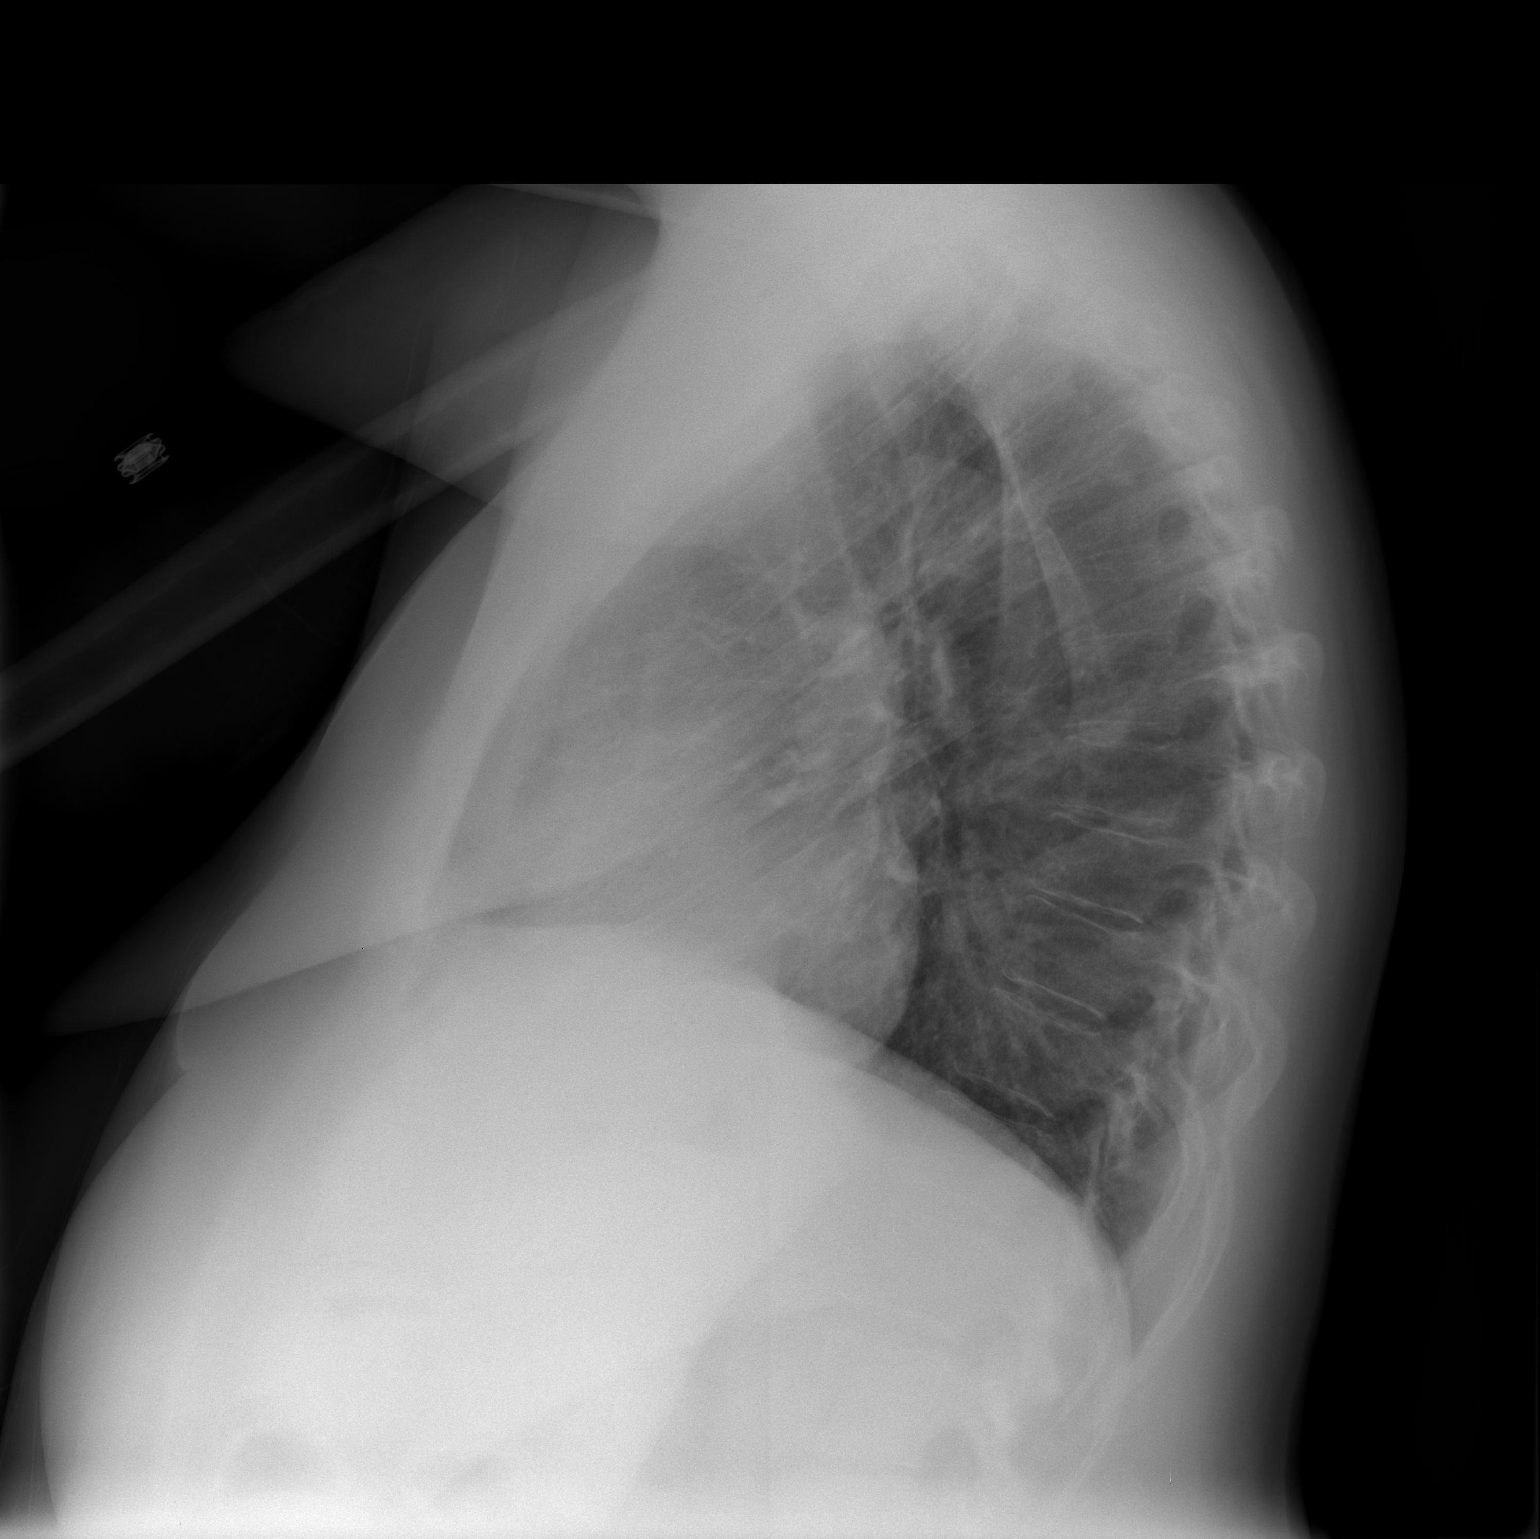

[2 of 2 positions shown; findings below may reference images not displayed]

FINDINGS: The heart size and mediastinal contours are within normal
limits.  Both lungs are clear.  The visualized skeletal structures
are unremarkable.
IMPRESSION: No active disease.

## 2012-11-14 ENCOUNTER — Ambulatory Visit: Payer: Medicare Other | Admitting: Internal Medicine

## 2012-11-14 ENCOUNTER — Telehealth: Payer: Self-pay | Admitting: *Deleted

## 2012-11-14 NOTE — Telephone Encounter (Signed)
Pt has had 9 cancellation since 06/21/10-11-02-12, and 2 No-shows 12-9/13 & 05/05/11, Pt also No-showed for today appt @ 2:30...lmb

## 2012-11-14 NOTE — Telephone Encounter (Signed)
Noted. Thank you. Please generate letter for dismissal of practice because of ineffective patient/physician relationship

## 2012-11-15 ENCOUNTER — Encounter: Payer: Self-pay | Admitting: *Deleted

## 2012-11-15 LAB — CBC
Hemoglobin: 7.6 g/dL — ABNORMAL LOW (ref 12.0–15.0)
MCH: 23.1 pg — ABNORMAL LOW (ref 26.0–34.0)
RBC: 3.29 MIL/uL — ABNORMAL LOW (ref 3.87–5.11)
RDW: 17.7 % — ABNORMAL HIGH (ref 11.5–15.5)

## 2012-11-15 LAB — BASIC METABOLIC PANEL
CO2: 34 mEq/L — ABNORMAL HIGH (ref 19–32)
Chloride: 97 mEq/L (ref 96–112)
Glucose, Bld: 116 mg/dL — ABNORMAL HIGH (ref 70–99)
Potassium: 3.7 mEq/L (ref 3.5–5.1)
Sodium: 138 mEq/L (ref 135–145)

## 2012-11-15 LAB — C-REACTIVE PROTEIN: CRP: 3.3 mg/dL — ABNORMAL HIGH (ref <0.60)

## 2012-11-15 LAB — FERRITIN: Ferritin: 12 ng/mL (ref 10–291)

## 2012-11-15 NOTE — Consult Note (Signed)
Reason for Consult: Abscess right shoulder Referring Physician Dr Ivin Poot [name] Carr is an 57 y.o. female.  HPI: : [name] Carr is a 57 year old woman with diabetes lupus and a history of an abscess of her right shoulder. She initially underwent irrigation and debridement in May by Dr. Christell Constant. She had repeat irrigation and debridement by Dr. Christell Constant in July. She followed up in September with Dr. Christell Constant and she was then readmitted to [facility] and a drain was placed by radiology on September 29.   Past Medical History  Diagnosis Date  . PERSONALITY DISORDER   . Chronic pain syndrome     chronic narcotics, dependancy hx - [place] for pain mgmt  . MIGRAINE HEADACHE   . Lyme disease   . ALLERGIC RHINITIS   . ANEMIA-IRON DEFICIENCY   . HYPERLIPIDEMIA   . GERD   . DIABETES MELLITUS, TYPE II   . DEPRESSION   . SLE (systemic lupus erythematosus)     [place] - chronic pred  . Psoriasis   . Addison's disease   . Degenerative joint disease   . DDD (degenerative disc disease)   . Phlebitis after infusion     left ?calf; "S/P phenergan/demerol injection"  . Blood clot in vein 2011; 02/04/11    "right jugular vein;left jugular vein"  . Hypothyroidism   . SEIZURE DISORDER     "lots of seizures; due to lupus"  . Anxiety   . Neuropathy     hands, feet, due to diabetes & back problem  . Surgical wound infection 02/15/2011    R shoulder and mastectomy site - related to PICC  . CARCINOMA, BREAST 08/2009 dx    R breast,  mets to liver - resolution s/p chemo, s/p B mastect  . Metastases to the liver   . RA (rheumatoid arthritis)     Past Surgical History  Procedure Laterality Date  . Liver bopsy  09/2009  . Port a cath placement  09/2009; 11/19/10  . Port-a-cath removal  04/2010    "due to staph & strept"  . Port-a-cath removal  12/23/2010    Procedure: REMOVAL PORT-A-CATH;  Surgeon: Valarie Merino, MD;  Location: [facility];  Service: General;  Laterality: Left;  . Breast  surgery  09/2009    right breast biopsy  . Mastectomy  11/19/10    bilateral mastectomy   . I&d extremity  ~ 2010    right hand; S/P "dog bite"  . Peripherally inserted central catheter insertion    . Spider bite      Family History  Problem Relation Age of Onset  . Lung cancer Mother   . Lung cancer Father   . Arthritis Other   . Diabetes Other   . Hyperlipidemia Other     Social History:  reports that she has never smoked. She has never used smokeless tobacco. She reports that she does not drink alcohol or use illicit drugs.  Allergies:  Allergies  Allergen Reactions  . Ammonia Other (See Comments)    Ended up on ventilator from ammonia tabs  . Contrast Media [Iodinated Diagnostic Agents] Other (See Comments)    Doesn't breath well.  . Iohexol Other (See Comments)    SOB   . Methotrexate Anaphylaxis  . Midazolam Hcl Anaphylaxis  . Nalbuphine Other (See Comments)    Can't breathe well  . Cephalexin Nausea And Vomiting and Other (See Comments)    Blisters.  . Infliximab Nausea And Vomiting  . Ketorolac  Tromethamine Other (See Comments)    Injectable doesn't work and pill hurts stomach.  . Nsaids Nausea And Vomiting  . Ultram [Tramadol Hcl] Nausea And Vomiting  . Celecoxib Rash    Medications: I have reviewed the [name] Carr's current medications.  No results found for this or any previous visit (from the past 48 hour(s)).  No results found.  Review of Systems  All other systems reviewed and are negative.   There were no vitals taken for this visit. Physical Exam On examination of the right shoulder there is no redness no cellulitis the incision is well-healed. There is clear serous drainage from the draining tube. This drain has been in for over a week. Assessment/Plan: Assessment chronic infection right shoulder. Plan I will review her laboratory studies and review her MRI scan and determine regarding surgical intervention tomorrow.  Jimmy Stipes V 11/15/2012,  7:21 AM

## 2012-11-15 NOTE — Telephone Encounter (Signed)
Letter has been generated md sign given to office manager Truddie Hidden) for discharging protocol...[name] Carr

## 2012-11-16 LAB — CBC
Hemoglobin: 7.6 g/dL — ABNORMAL LOW (ref 12.0–15.0)
MCV: 75.3 fL — ABNORMAL LOW (ref 78.0–100.0)
RBC: 3.32 MIL/uL — ABNORMAL LOW (ref 3.87–5.11)
WBC: 8.8 10*3/uL (ref 4.0–10.5)

## 2012-11-16 LAB — BASIC METABOLIC PANEL
CO2: 32 mEq/L (ref 19–32)
Chloride: 98 mEq/L (ref 96–112)
GFR calc non Af Amer: 70 mL/min — ABNORMAL LOW (ref >90)
Glucose, Bld: 86 mg/dL (ref 70–99)
Potassium: 3.9 mEq/L (ref 3.5–5.1)
Sodium: 138 mEq/L (ref 135–145)

## 2012-11-16 NOTE — Progress Notes (Signed)
Patient ID: [name] Carr, female   DOB: 06-19-1955, 57 y.o.   MRN: [id number] Examination of patient's shoulder she has no redness no cellulitis no drainage. Her laboratory studies are within normal limits she has a white cell count of 8.4 sedimentation rate of 16 and a C. reactive protein elevated at 3.3. The low elevation of the CRP is most likely do to her lupus. I do not see an indication at this time to proceed with any type of surgical intervention. The fluid on the MRI scan is most likely serous reactive fluid from her surgeries. Recommend continuing IV antibiotics for 4 weeks. I will followup as needed.

## 2012-11-19 ENCOUNTER — Telehealth: Payer: Self-pay | Admitting: Internal Medicine

## 2012-11-19 ENCOUNTER — Other Ambulatory Visit (HOSPITAL_COMMUNITY): Payer: Medicare Other

## 2012-11-19 LAB — CBC
Hemoglobin: 7.6 g/dL — ABNORMAL LOW (ref 12.0–15.0)
MCV: 75.6 fL — ABNORMAL LOW (ref 78.0–100.0)
Platelets: 244 10*3/uL (ref 150–400)
RBC: 3.28 MIL/uL — ABNORMAL LOW (ref 3.87–5.11)
WBC: 7.2 10*3/uL (ref 4.0–10.5)

## 2012-11-19 LAB — BASIC METABOLIC PANEL
CO2: 31 mEq/L (ref 19–32)
Chloride: 98 mEq/L (ref 96–112)
Glucose, Bld: 103 mg/dL — ABNORMAL HIGH (ref 70–99)
Sodium: 136 mEq/L (ref 135–145)

## 2012-11-19 LAB — SEDIMENTATION RATE: Sed Rate: 25 mm/hr — ABNORMAL HIGH (ref 0–22)

## 2012-11-19 LAB — C-REACTIVE PROTEIN: CRP: 11.7 mg/dL — ABNORMAL HIGH (ref <0.60)

## 2012-11-19 NOTE — Telephone Encounter (Signed)
Dismissal Letter sent by Certified Mail 11/19/2012  Dismissal Letter returned Attempted Unknown 12/04/2012  Dismissal Letter sent by 1st Class Mail to [address], [place] 12/05/2012

## 2012-11-20 LAB — POTASSIUM: Potassium: 3.2 mEq/L — ABNORMAL LOW (ref 3.5–5.1)

## 2012-11-20 LAB — BASIC METABOLIC PANEL
CO2: 31 mEq/L (ref 19–32)
GFR calc Af Amer: 81 mL/min — ABNORMAL LOW (ref >90)
GFR calc non Af Amer: 70 mL/min — ABNORMAL LOW (ref >90)
Potassium: 3 mEq/L — ABNORMAL LOW (ref 3.5–5.1)
Sodium: 138 mEq/L (ref 135–145)

## 2012-11-21 LAB — CBC
HCT: 24.6 % — ABNORMAL LOW (ref 36.0–46.0)
Hemoglobin: 7.5 g/dL — ABNORMAL LOW (ref 12.0–15.0)
MCV: 74.8 fL — ABNORMAL LOW (ref 78.0–100.0)
RBC: 3.29 MIL/uL — ABNORMAL LOW (ref 3.87–5.11)
WBC: 8.7 10*3/uL (ref 4.0–10.5)

## 2012-11-21 LAB — BASIC METABOLIC PANEL
BUN: 13 mg/dL (ref 6–23)
CO2: 30 mEq/L (ref 19–32)
Chloride: 97 mEq/L (ref 96–112)
GFR calc non Af Amer: 77 mL/min — ABNORMAL LOW (ref >90)
Glucose, Bld: 138 mg/dL — ABNORMAL HIGH (ref 70–99)
Potassium: 4 mEq/L (ref 3.5–5.1)
Sodium: 138 mEq/L (ref 135–145)

## 2012-11-22 ENCOUNTER — Other Ambulatory Visit: Payer: Self-pay | Admitting: *Deleted

## 2012-11-22 ENCOUNTER — Non-Acute Institutional Stay (SKILLED_NURSING_FACILITY): Payer: Medicare Other | Admitting: Internal Medicine

## 2012-11-22 DIAGNOSIS — R609 Edema, unspecified: Secondary | ICD-10-CM

## 2012-11-22 DIAGNOSIS — IMO0002 Reserved for concepts with insufficient information to code with codable children: Secondary | ICD-10-CM

## 2012-11-22 DIAGNOSIS — E119 Type 2 diabetes mellitus without complications: Secondary | ICD-10-CM

## 2012-11-22 DIAGNOSIS — L02419 Cutaneous abscess of limb, unspecified: Secondary | ICD-10-CM

## 2012-11-22 DIAGNOSIS — D638 Anemia in other chronic diseases classified elsewhere: Secondary | ICD-10-CM

## 2012-11-22 MED ORDER — MORPHINE SULFATE ER 15 MG PO TBCR: EXTENDED_RELEASE_TABLET | ORAL | Status: DC

## 2012-11-22 MED ORDER — HYDROMORPHONE HCL 4 MG PO TABS: ORAL_TABLET | ORAL | Status: DC

## 2012-11-28 ENCOUNTER — Non-Acute Institutional Stay (SKILLED_NURSING_FACILITY): Payer: Medicare Other | Admitting: Family

## 2012-11-28 ENCOUNTER — Encounter: Payer: Self-pay | Admitting: Family

## 2012-11-28 DIAGNOSIS — L0291 Cutaneous abscess, unspecified: Secondary | ICD-10-CM

## 2012-11-28 DIAGNOSIS — E876 Hypokalemia: Secondary | ICD-10-CM

## 2012-11-28 NOTE — Progress Notes (Signed)
Patient ID: [name] Carr, female   DOB: [birth year], 57 y.o.   MRN: [id number] Date: 11/28/12  Facility: [facility]  Code Status:  Full  Chief Complaint  Patient presents with  . Discharge Note    HPI: Pt presents for scheduled discharge 12/01/12. Pt was admitted to facility for short-term rehabilitation and medical management of R shoulder abscess in addition to chronic diseases.  Pt is being treated with 6 week course of Levquin IV 750 mg ordered by Infectious [facility] of [facility]. Pt reports chronic pain that is only mitigated with po Dilaudid. Pt verbalizes chronic back pain is d/t DJD and SLE.  Pt denies vomiting, diarrhea, fever, chills; however endorses persistent nausea.  Pt further reports being d/c from [facility] d/t non-adherence with pain contract. Pt reports Morphine po is not longer effective due to long-term use IM morphine. Pt verbalizes concern of obtaining continuous pain management. Pt/ health care team denies further concerns and issues at present.       Allergies  Allergen Reactions  . Ammonia Other (See Comments)    Ended up on ventilator from ammonia tabs  . Contrast Media [Iodinated Diagnostic Agents] Other (See Comments)    Doesn't breath well.  . Iohexol Other (See Comments)    SOB   . Methotrexate Anaphylaxis  . Midazolam Hcl Anaphylaxis  . Nalbuphine Other (See Comments)    Can't breathe well  . Cephalexin Nausea And Vomiting and Other (See Comments)    Blisters.  . Infliximab Nausea And Vomiting  . Ketorolac Tromethamine Other (See Comments)    Injectable doesn't work and pill hurts stomach.  . Nsaids Nausea And Vomiting  . Ultram [Tramadol Hcl] Nausea And Vomiting  . Celecoxib Rash     Medication List       This list is accurate as of: 11/28/12  3:17 PM.  Always use your most recent med list.               acetaminophen 325 MG tablet  Commonly known as:  TYLENOL  Take 650 mg by mouth every 6 (six)  hours as needed for pain or fever.     ALPRAZolam 1 MG tablet  Commonly known as:  XANAX  Take 1 tablet by mouth 2 (two) times daily as needed.     butalbital-acetaminophen-caffeine 50-325-40 MG per tablet  Commonly known as:  FIORICET, ESGIC  Take 1 tablet by mouth 2 (two) times daily as needed for headache.     carisoprodol 350 MG tablet  Commonly known as:  SOMA  Take 1 tablet (350 mg total) by mouth 3 (three) times daily as needed for muscle spasms. Muscle pain     clonazePAM 1 MG tablet  Commonly known as:  KLONOPIN  Take 1 tablet (1 mg total) by mouth 2 (two) times daily as needed for anxiety.     docusate sodium 100 MG capsule  Commonly known as:  COLACE  Take 200 mg by mouth 2 (two) times daily.     DULoxetine 60 MG capsule  Commonly known as:  CYMBALTA  Take 1 capsule (60 mg total) by mouth 2 (two) times daily.     esomeprazole 40 MG capsule  Commonly known as:  NEXIUM  Take 40 mg by mouth daily before breakfast.     furosemide 20 MG tablet  Commonly known as:  LASIX  Take 1 tablet (20 mg total) by mouth 2 (two) times daily as needed for edema. Take  1 tablet every morning and take 1 additional tablet at night as needed for swelling     gabapentin 300 MG capsule  Commonly known as:  NEURONTIN  Take 3 capsules (900 mg total) by mouth 3 (three) times daily.     gabapentin 300 MG capsule  Commonly known as:  NEURONTIN  TAKE 3 CAPSULES BY MOUTH THREE TIMES DAILY     hydrochlorothiazide 25 MG tablet  Commonly known as:  HYDRODIURIL  Take 25 mg by mouth every morning.     HYDROcodone-acetaminophen 10-325 MG per tablet  Commonly known as:  NORCO  Take 1-2 tablets by mouth every 6 (six) hours as needed for pain.     HYDROmorphone 4 MG tablet  Commonly known as:  DILAUDID  Take one tablet by mouth every 6 hours as needed for pain     magnesium hydroxide 400 MG/5ML suspension  Commonly known as:  MILK OF MAGNESIA  Take by mouth daily as needed for constipation.      morphine 15 MG 12 hr tablet  Commonly known as:  MS CONTIN  Take one tablet by mouth three times daily. Do not crush     PERCOCET 5-325 MG per tablet  Generic drug:  oxyCODONE-acetaminophen  Take 1 tablet by mouth every 6 (six) hours as needed for pain.     potassium chloride SA 20 MEQ tablet  Commonly known as:  K-DUR,KLOR-CON  Take 1 tablet (20 mEq total) by mouth 2 (two) times daily. Take 1 by mouth twice a day     predniSONE 20 MG tablet  Commonly known as:  DELTASONE  Take 20 mg by mouth daily.     PROBIOTIC FORMULA Caps  Take 1 capsule by mouth every morning.     promethazine 25 MG tablet  Commonly known as:  PHENERGAN  Take 25 mg by mouth every 6 (six) hours as needed for nausea.     promethazine 25 MG/ML injection  Commonly known as:  PHENERGAN  Inject 25 mg into the muscle once as needed for nausea or vomiting.     rOPINIRole 1 MG tablet  Commonly known as:  REQUIP  Take 1 mg by mouth at bedtime.     traZODone 150 MG tablet  Commonly known as:  DESYREL  Take 150 mg by mouth at bedtime.         DATA REVIEWED    Laboratory Studies: 11/26/12- WBC 10.9, RBC 3.73, Hemoglobin 8.6, Hematocrit 27.4, Platelets 405, K 3.0, Cl 92, Ca 8.4     Past Medical History  Diagnosis Date  . PERSONALITY DISORDER   . Chronic pain syndrome     chronic narcotics, dependancy hx - [facility] for pain mgmt  . MIGRAINE HEADACHE   . Lyme disease   . ALLERGIC RHINITIS   . ANEMIA-IRON DEFICIENCY   . HYPERLIPIDEMIA   . GERD   . DIABETES MELLITUS, TYPE II   . DEPRESSION   . SLE (systemic lupus erythematosus)     [facility] - chronic pred  . Psoriasis   . Addison's disease   . Degenerative joint disease   . DDD (degenerative disc disease)   . Phlebitis after infusion     left ?calf; "S/P phenergan/demerol injection"  . Blood clot in vein 2011; 02/04/11    "right jugular vein;left jugular vein"  . Hypothyroidism   . SEIZURE DISORDER     "lots of seizures; due  to lupus"  . Anxiety   . Neuropathy  hands, feet, due to diabetes & back problem  . Surgical wound infection 02/15/2011    R shoulder and mastectomy site - related to PICC  . CARCINOMA, BREAST 08/2009 dx    R breast,  mets to liver - resolution s/p chemo, s/p B mastect  . Metastases to the liver   . RA (rheumatoid arthritis)     Past Surgical History  Procedure Laterality Date  . Liver bopsy  09/2009  . Port a cath placement  09/2009; 11/19/10  . Port-a-cath removal  04/2010    "due to staph & strept"  . Port-a-cath removal  12/23/2010    Procedure: REMOVAL PORT-A-CATH;  Surgeon: Valarie Merino, MD;  Location: [facility];  Service: General;  Laterality: Left;  . Breast surgery  09/2009    right breast biopsy  . Mastectomy  11/19/10    bilateral mastectomy   . I&d extremity  ~ 2010    right hand; S/P "dog bite"  . Peripherally inserted central catheter insertion    . Spider bite       History   Social History  . Marital Status: Single    Spouse Name: N/A    Number of Children: N/A  . Years of Education: N/A   Occupational History  . Not on file.   Social History Main Topics  . Smoking status: Never Smoker   . Smokeless tobacco: Never Used  . Alcohol Use: No  . Drug Use: No  . Sexual Activity: No   Other Topics Concern  . Not on file   Social History Narrative  . No narrative on file   Review of Systems  Constitutional: Negative.   HENT: Negative.   Eyes: Negative.   Respiratory: Negative.   Cardiovascular: Negative.   Gastrointestinal: Negative.        Ascites  Genitourinary: Negative.   Musculoskeletal: Positive for back pain.  Skin:       Granulated Scars to BUE  Neurological: Negative.   Endo/Heme/Allergies:       SLE, Chronic Steroid Use, DM  Psychiatric/Behavioral: Positive for depression.     Physical Exam Filed Vitals:   11/28/12 1451  BP: 120/70  Pulse: 86  Temp: 99.3 F (37.4 C)  Resp: 18   There is no weight on file to calculate  BMI. Physical Exam  Constitutional: She is oriented to person, place, and time. She appears well-developed and well-nourished. She is cooperative. No distress.  Neck: No JVD present. No thyromegaly present.  R double lumen IJ  Cardiovascular: Normal rate, regular rhythm, normal heart sounds and intact distal pulses.   Pulmonary/Chest: Effort normal and breath sounds normal.  Lymphadenopathy:    She has no cervical adenopathy.  Neurological: She is alert and oriented to person, place, and time.  Skin: Skin is warm and dry.  IJ site dry and intact/ dsg intact-Site without erythema, exudate or edema  Psychiatric: Her speech is normal and behavior is normal. Judgment and thought content normal. Her mood appears anxious. Cognition and memory are normal.    ASSESSMENT/PLAN  Hypokalemia- Stat BMET, Started on Potassium Chloride po bid Orders placed for d/c 10/25 pending lab results-with [facility]N for IV adminstration of Levaquin 750 mg daily for 6 weeks-stop 11/13 and medication management, PT for mobility,OT for ADL's and CNA for ADLassistance. Advised pt to f/u with PCP regarding the management of chronic illnesses and pain Education regarding non-pharmacologic interventions for pain management Advised pt to f/u with pain clinic for pain management  Follow up: Friday, 11/30/12

## 2012-12-01 DIAGNOSIS — IMO0002 Reserved for concepts with insufficient information to code with codable children: Secondary | ICD-10-CM

## 2012-12-01 DIAGNOSIS — Z5181 Encounter for therapeutic drug level monitoring: Secondary | ICD-10-CM

## 2012-12-01 DIAGNOSIS — Z452 Encounter for adjustment and management of vascular access device: Secondary | ICD-10-CM

## 2012-12-01 DIAGNOSIS — E119 Type 2 diabetes mellitus without complications: Secondary | ICD-10-CM

## 2012-12-02 ENCOUNTER — Other Ambulatory Visit: Payer: Self-pay | Admitting: Internal Medicine

## 2012-12-03 ENCOUNTER — Other Ambulatory Visit: Payer: Self-pay | Admitting: *Deleted

## 2012-12-03 MED ORDER — CARISOPRODOL 350 MG PO TABS: 350.0000 mg | ORAL_TABLET | Freq: Three times a day (TID) | ORAL | Status: DC | PRN

## 2012-12-04 NOTE — Telephone Encounter (Signed)
Faxed script back to [facility].../lmb 

## 2012-12-07 ENCOUNTER — Ambulatory Visit: Payer: Self-pay | Admitting: Internal Medicine

## 2012-12-10 ENCOUNTER — Telehealth: Payer: Self-pay | Admitting: *Deleted

## 2012-12-10 NOTE — Telephone Encounter (Signed)
Received vm call from pt stating that she is now out of hospital & nursing facility & needs to reschedule her PET & MD visit.  She states that she is afraid to r/s/ b/c every time she does she ends back in the hosp.  She also reports that she has a central line that will be in through the 12th of Nov. & request that she be called 1st b/c she has some conflicting appts.  New POF to schedulers & Note to MD.

## 2012-12-12 ENCOUNTER — Encounter: Payer: Self-pay | Admitting: Internal Medicine

## 2012-12-12 ENCOUNTER — Ambulatory Visit (INDEPENDENT_AMBULATORY_CARE_PROVIDER_SITE_OTHER): Payer: Medicare Other | Admitting: Internal Medicine

## 2012-12-12 VITALS — BP 142/80 | HR 84 | Temp 97.4°F | Ht 65.0 in | Wt 166.5 lb

## 2012-12-12 DIAGNOSIS — L0390 Cellulitis, unspecified: Secondary | ICD-10-CM

## 2012-12-12 DIAGNOSIS — M329 Systemic lupus erythematosus, unspecified: Secondary | ICD-10-CM

## 2012-12-12 DIAGNOSIS — L0291 Cutaneous abscess, unspecified: Secondary | ICD-10-CM

## 2012-12-12 DIAGNOSIS — G894 Chronic pain syndrome: Secondary | ICD-10-CM

## 2012-12-12 MED ORDER — TRAMADOL HCL 50 MG PO TABS: 50.0000 mg | ORAL_TABLET | Freq: Four times a day (QID) | ORAL | Status: DC | PRN

## 2012-12-12 MED ORDER — PREDNISONE 10 MG PO TABS: 10.0000 mg | ORAL_TABLET | Freq: Two times a day (BID) | ORAL | Status: DC

## 2012-12-12 NOTE — Progress Notes (Signed)
Pre-visit discussion using our clinic review tool. No additional management support is needed unless otherwise documented below in the visit note.  

## 2012-12-12 NOTE — Assessment & Plan Note (Signed)
Hx of narcotic dependency Off all narcotics since 11/2012  prev followed with various pain mgmt clinics for same, but dismissed  Ok for prn tramadol - has plans to establish care with new PCP in [place]

## 2012-12-12 NOTE — Assessment & Plan Note (Signed)
R shoulder abscess - hospital for same 10/2012 and 3 surgeries IV Levaquin x 6 weeks - to run through 12/20/12 - mgmt by [employer] [facility]

## 2012-12-12 NOTE — Patient Instructions (Addendum)
It was good to see you today.  We have reviewed your prior records including labs and tests today  Medications reviewed and updated, ok for tramadol- no other changes recommended at this time.  Your prescription(s) have been given to you to submit to your pharmacy. Please take as directed and contact our office if you believe you are having problem(s) with the medication(s).  I am very proud of you for getting off all your narcotics - keep up the good work!!!  we'll make referral to Dr Herma Carson for rheumatology in [facility]. Our office will contact you regarding appointment(s) once made.  I will send medical information to Dr Tresa Endo at [facility] in [facility] once you establish care there for new primary care office close to home

## 2012-12-12 NOTE — Progress Notes (Signed)
Subjective:    Patient ID: [name] Carr, female    DOB: 10-16-1955, 57 y.o.   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 PET scan revealed liver involvement and biopsy confirmed poorly differentiated adenocarcinoma consistent with metastatic breast. Hx prior Port-A-Cath and PICC lines removed due to infection. Persisting clinical concerns re: possible self manipulation of these lines. Onc ([facility]) rx'd oral Xeloda, but not tolerated med due to nausea. Conservative mgmt ongoing given apparent quiet dz process  Chronic pain - previously managed by pain clinic Dr. Nilsa Nutting, dismissed 02/2011 - reports remotely on home self administered narcotic injections due to nausea and vomiting and difficulty keeping pills down - duragesic patches "helps but not enough" - also on cymbalta  SLE - hx pericarditis - on chronic prednisone for same -   follows periodically with rheum at [facility]- no fever, no new rash   Psoriasis - chronic plaque L elbow - uses steroid cream for same  Past Medical History  Diagnosis Date  . PERSONALITY DISORDER   . Chronic pain syndrome     chronic narcotics, dependancy hx - [facility] for pain mgmt  . MIGRAINE HEADACHE   . Lyme disease   . ALLERGIC RHINITIS   . ANEMIA-IRON DEFICIENCY   . HYPERLIPIDEMIA   . GERD   . DIABETES MELLITUS, TYPE II   . DEPRESSION   . SLE (systemic lupus erythematosus)     [facility] - chronic pred  . Psoriasis   . Addison's disease   . Degenerative joint disease   . DDD (degenerative disc disease)   . Phlebitis after infusion     left ?calf; "S/P phenergan/demerol injection"  . Blood clot in vein 2011; 02/04/11     "right jugular vein;left jugular vein"  . Hypothyroidism   . SEIZURE DISORDER     "lots of seizures; due to lupus"  . Anxiety   . Neuropathy     hands, feet, due to diabetes & back problem  . Surgical wound infection 02/15/2011    R shoulder and mastectomy site - related to PICC  . CARCINOMA, BREAST 08/2009 dx    R breast,  mets to liver - resolution s/p chemo, s/p B mastect  . Metastases to the liver   . RA (rheumatoid arthritis)     Review of Systems  Constitutional: Positive for fatigue. Negative for fever and unexpected weight change.  Respiratory: Negative for cough and shortness of breath.   Gastrointestinal: Negative for blood in stool.  Genitourinary: Negative for dysuria.  Musculoskeletal: Positive for extremity weakness.  Neurological: Negative for dizziness.       Objective:   Physical Exam BP 142/80  Pulse 84  Temp(Src) 97.4 F (36.3 C) (Oral)  Ht 5\' 5"  (1.651 m)  Wt 166 lb 8 oz (75.524 kg)  BMI 27.71 kg/m2  SpO2 98% Wt Readings from Last 3 Encounters:  12/12/12 166 lb 8 oz (75.524 kg)  05/24/12 159 lb (72.122 kg)  03/15/12 173 lb 8 oz (78.699 kg)   Constitutional: She is overweight; appears well-developed and well-nourished. No distress.  HENT: poor dentition, several missing teeth in upper jaw Cardiovascular: Normal rate, regular rhythm and normal heart sounds.  No murmur  heard. no BLE edema. Pulmonary/Chest: Effort normal and breath sounds normal. No respiratory distress. She has no wheezes.  Skin - Well-healed surgical scar on right posterior shoulder -no evidence of fluctuation, erythema or drainage. Central IV line right anterior chest intact Psychiatric: She has a normal mood and pleasant affect. Her behavior is normal. Judgment and thought content normal.   Lab Results  Component Value Date   WBC 8.7 11/21/2012   HGB 7.5* 11/21/2012   HCT 24.6* 11/21/2012   PLT 336 11/21/2012   GLUCOSE 138* 11/21/2012   CHOL 220* 09/12/2011   TRIG 121.0 09/12/2011    HDL 61.60 09/12/2011   LDLDIRECT 127.6 09/12/2011   LDLCALC 162 08/09/2009   ALT 33 11/09/2012   AST 24 11/09/2012   NA 138 11/21/2012   K 4.0 11/21/2012   CL 97 11/21/2012   CREATININE 0.82 11/21/2012   BUN 13 11/21/2012   CO2 30 11/21/2012   TSH 1.782 11/09/2012   INR 1.02 02/04/2011   HGBA1C 6.2* 12/17/2011       Assessment & Plan:   See problem list. Medications and labs reviewed today.  Time spent with pt today 25 minutes, greater than 50% time spent counseling patient on recent hospitalization for R shoulder abscess/cellulitis and IV Levaquin ongoing 6 weeks (through 12/20/12), potassium, chronic pain and medication review. Also review of prior records

## 2012-12-12 NOTE — Assessment & Plan Note (Signed)
Chronic dz pt declines to return to [facility] and feels [place] too far, prefers [place] provider - will refer Chronic pred without change 10mg  qd, refill provided

## 2012-12-13 ENCOUNTER — Other Ambulatory Visit: Payer: Self-pay | Admitting: Oncology

## 2012-12-13 DIAGNOSIS — C50919 Malignant neoplasm of unspecified site of unspecified female breast: Secondary | ICD-10-CM

## 2012-12-14 ENCOUNTER — Telehealth: Payer: Self-pay | Admitting: *Deleted

## 2012-12-14 ENCOUNTER — Telehealth: Payer: Self-pay | Admitting: Oncology

## 2012-12-14 MED ORDER — PREDNISONE 20 MG PO TABS: 20.0000 mg | ORAL_TABLET | Freq: Every day | ORAL | Status: DC

## 2012-12-14 NOTE — Telephone Encounter (Signed)
Notified pt woth md response...lmb

## 2012-12-14 NOTE — Telephone Encounter (Signed)
Left msg on vm stating md gave refill on her prednisone while she was in hosp they increase it. Pharmacy want fill it because it too soon. Want to see if a script for 20 mg can be call in...[name]

## 2012-12-14 NOTE — Telephone Encounter (Signed)
sw pt after working 11/6 POF made aware of appts Took printout to [place] for Prior Auth work for [employer] [place] to pt shh

## 2012-12-14 NOTE — Telephone Encounter (Signed)
ok 

## 2012-12-15 ENCOUNTER — Other Ambulatory Visit: Payer: Self-pay | Admitting: Internal Medicine

## 2012-12-19 DIAGNOSIS — D638 Anemia in other chronic diseases classified elsewhere: Secondary | ICD-10-CM | POA: Insufficient documentation

## 2012-12-19 DIAGNOSIS — R609 Edema, unspecified: Secondary | ICD-10-CM | POA: Insufficient documentation

## 2012-12-19 NOTE — Progress Notes (Signed)
Patient ID: [name] Carr, female   DOB: Mar 29, 1955, 57 y.o.   MRN: [id number]        HISTORY & PHYSICAL  DATE: 11/22/2012     FACILITY: [facility]  LEVEL OF CARE: SNF (31)  ALLERGIES:  Allergies  Allergen Reactions  . Ammonia Other (See Comments)    Ended up on ventilator from ammonia tabs  . Contrast Media [Iodinated Diagnostic Agents] Other (See Comments)    Doesn't breath well.  . Iohexol Other (See Comments)    SOB   . Methotrexate Anaphylaxis  . Midazolam Hcl Anaphylaxis  . Nalbuphine Other (See Comments)    Can't breathe well  . Cephalexin Nausea And Vomiting and Other (See Comments)    Blisters.  . Infliximab Nausea And Vomiting  . Ketorolac Tromethamine Other (See Comments)    Injectable doesn't work and pill hurts stomach.  . Nsaids Nausea And Vomiting  . Celecoxib Rash    CHIEF COMPLAINT:  Manage right shoulder abscess, diabetes mellitus, and anemia of chronic disease.    HISTORY OF PRESENT ILLNESS:  The patient is a 57 year-old, Caucasian female who has a history of recurrent right shoulder abscess.  The patient was hospitalized for recurrence of right shoulder abscess and, after hospitalization, admitted to this facility for IV antibiotics and short-term rehabilitation.    RIGHT SHOULDER ABSCESS:  Confirmed by ultrasound and MRI.  Per Orthopedics request, Interventional Radiology subsequently placed a drain which drained fluid and purulent material.  Culture was positive for Enterobacter.  The patient was initially treated with IV vancomycin and cefepime.  By Infectious Disease recommendation, antibiotics were changed to IV Levaquin for six weeks.  Patient is complaining of multiple pains.    DM:pt's DM remains stable.  Pt denies polyuria, polydipsia, polyphagia, changes in vision or hypoglycemic episodes.  No complications noted from the medication presently being used.  Last hemoglobin A1c is:   Not available.    ANEMIA: The anemia has been  stable. The patient denies fatigue, melena or hematochezia.  The patient is currently not on iron.    Hemoglobin was 7.5 at discharge.    PAST MEDICAL HISTORY :  Past Medical History  Diagnosis Date  . PERSONALITY DISORDER   . Chronic pain syndrome     chronic narcotics, dependancy hx - [facility] for pain mgmt  . MIGRAINE HEADACHE   . Lyme disease   . ALLERGIC RHINITIS   . ANEMIA-IRON DEFICIENCY   . HYPERLIPIDEMIA   . GERD   . DIABETES MELLITUS, TYPE II   . DEPRESSION   . SLE (systemic lupus erythematosus)     [facility] - chronic pred  . Psoriasis   . Addison's disease   . Degenerative joint disease   . DDD (degenerative disc disease)   . Phlebitis after infusion     left ?calf; "S/P phenergan/demerol injection"  . Blood clot in vein 2011; 02/04/11    "right jugular vein;left jugular vein"  . Hypothyroidism   . SEIZURE DISORDER     "lots of seizures; due to lupus"  . Anxiety   . Neuropathy     hands, feet, due to diabetes & back problem  . Surgical wound infection 02/15/2011    R shoulder and mastectomy site - related to PICC  . CARCINOMA, BREAST 08/2009 dx    R breast,  mets to liver - resolution s/p chemo, s/p B mastect  . Metastases to the liver   . RA (rheumatoid arthritis)  Recurrent right shoulder abscess.    Medical noncompliance.    PAST SURGICAL HISTORY: Past Surgical History  Procedure Laterality Date  . Liver bopsy  09/2009  . Port a cath placement  09/2009; 11/19/10  . Port-a-cath removal  04/2010    "due to staph & strept"  . Port-a-cath removal  12/23/2010    Procedure: REMOVAL PORT-A-CATH;  Surgeon: Valarie Merino, MD;  Location: [facility];  Service: General;  Laterality: Left;  . Breast surgery  09/2009    right breast biopsy  . Mastectomy  11/19/10    bilateral mastectomy   . I&d extremity  ~ 2010    right hand; S/P "dog bite"  . Peripherally inserted central catheter insertion    . Spider bite     Right shoulder abscess drain.     SOCIAL HISTORY:  reports that she has never smoked. She has never used smokeless tobacco. She reports that she does not drink alcohol or use illicit drugs. HOUSING:  The patient was living at home prior to hospitalization.   EMPLOYMENT HISTORY:  She is unemployed.   ILLICIT DRUGS:  She does have narcotic dependence.    FAMILY HISTORY:  Family History  Problem Relation Age of Onset  . Lung cancer Mother   . Lung cancer Father   . Arthritis Other   . Diabetes Other   . Hyperlipidemia Other   MOTHER/FATHER:  Both parents deceased secondary to lung cancer.      CURRENT MEDICATIONS: Reviewed per [facility]  REVIEW OF SYSTEMS:   GI:  Complains of nausea and vomiting but denies abdominal pain, diarrhea, or constipation.   CARDIAC:   Complains of increased lower extremity swelling.    See HPI otherwise 14 point ROS is negative.  PHYSICAL EXAMINATION  VS:  T 97.6       P 86      RR 18      BP 130/80      POX%        WT (Lb)  GENERAL: no acute distress, moderately obese body habitus EYES: conjunctivae normal, sclerae normal, normal eye lids MOUTH/THROAT: lips without lesions,no lesions in the mouth,tongue is without lesions,uvula elevates in midline NECK: supple, trachea midline, no neck masses, no thyroid tenderness, no thyromegaly LYMPHATICS: no LAN in the neck, no supraclavicular LAN RESPIRATORY: breathing is even & unlabored, BS CTAB CARDIAC: RRR, no murmur,no extra heart sounds   EDEMA/VARICOSITIES:  +3 bilateral lower extremity edema  ARTERIAL:  pedal pulses +1 bilaterally   GI:  ABDOMEN: abdomen soft, normal BS, no masses, no tenderness  LIVER/SPLEEN: no hepatomegaly, no splenomegaly MUSCULOSKELETAL: HEAD: normal to inspection & palpation BACK: no kyphosis, scoliosis or spinal processes tenderness EXTREMITIES: LEFT UPPER EXTREMITY: strength intact, range of motion moderate   RIGHT UPPER EXTREMITY: strength intact, range of motion moderate   LEFT LOWER EXTREMITY:  full range of  motion, normal strength & tone RIGHT LOWER EXTREMITY:  full range of motion, normal strength & tone PSYCHIATRIC: the patient is alert & oriented to person, affect & behavior appropriate  LABS/RADIOLOGY: White count 8.7, hemoglobin 7.5, platelets 336.    Sodium 138, potassium 4, chloride 97, CO2 30, BUN 13, creatinine 0.8, glucose 138, calcium 8.9.    ASSESSMENT/PLAN:  Right shoulder abscess.  Continue IV Levaquin.    Diabetes mellitus.  Continue current medications.    Anemia of chronic disease.  Reassess hemoglobin level.    Lower extremity edema.   Unstable problem.  Increase Lasix to 40 mg  b.i.d.  Discontinue hydrochlorothiazide.    Nausea and vomiting.  Likely due to antibiotics; but we will check liver profile, amylase, and lipase.    SLE.  Continue current medications.    Chronic pain syndrome.  The patient is narcotic-dependent.  Therefore, we will not further increase her narcotics.    Check CBC and CMP.    I have reviewed patient's medical records received at admission/from hospitalization.  CPT CODE: 78295

## 2012-12-24 ENCOUNTER — Telehealth: Payer: Self-pay | Admitting: *Deleted

## 2012-12-24 NOTE — Telephone Encounter (Signed)
Patient called.  She is in the hospital at [facility].  She is septic and has MRSA.  She is due to have a PET scan 11/19 and see Dr.[facility] 11/28.  She reports that she thinks she will be in the hospital till at least Sat. Or Sunday.  Requested that she call us when she is out of the hospital and able to get PET scan.  Notified Rose/scheduler to cancel appt.   Patient call back is [phone].

## 2012-12-26 ENCOUNTER — Telehealth: Payer: Self-pay | Admitting: Oncology

## 2012-12-26 ENCOUNTER — Other Ambulatory Visit: Payer: Self-pay

## 2012-12-26 ENCOUNTER — Encounter (HOSPITAL_COMMUNITY): Payer: Medicare Other

## 2012-12-26 NOTE — Telephone Encounter (Signed)
Gave pt appt for  lab and PET scan , called nurse left message regarding pt needing appt to see MD, pt admitted @ [place] hospital right now

## 2012-12-27 ENCOUNTER — Other Ambulatory Visit: Payer: Self-pay | Admitting: Internal Medicine

## 2013-01-01 ENCOUNTER — Telehealth: Payer: Self-pay | Admitting: *Deleted

## 2013-01-01 NOTE — Telephone Encounter (Signed)
Patient called reporting she is "finally out of the hospital".  Requesting an appointment after scans.  Uses [facility] and has to give at least a two week notice for transportation is why she needs to know when her appointment is.  Reports she was "discharged Saturday night.  Due to frequent hospitalizaations, scans have been rescheduled for 01-15-2013 from April 2014.  I'm nervous and really want the scans and f/u."  Will notify providers.  Patient can be reached at [phone].

## 2013-01-04 ENCOUNTER — Telehealth: Payer: Self-pay | Admitting: *Deleted

## 2013-01-04 ENCOUNTER — Ambulatory Visit: Payer: Self-pay | Admitting: Oncology

## 2013-01-04 NOTE — Telephone Encounter (Signed)
Talked with pt & informed that Dr Cyndie Chime would not r/s MD visit until she has her PET Scan.  She expressed understanding. PET is scheduled for 01/15/13 & she will also get her labs done then.

## 2013-01-09 ENCOUNTER — Encounter: Payer: Self-pay | Admitting: Internal Medicine

## 2013-01-10 ENCOUNTER — Other Ambulatory Visit: Payer: Self-pay | Admitting: Internal Medicine

## 2013-01-15 ENCOUNTER — Other Ambulatory Visit (HOSPITAL_BASED_OUTPATIENT_CLINIC_OR_DEPARTMENT_OTHER): Payer: Medicare Other | Admitting: Lab

## 2013-01-15 ENCOUNTER — Encounter (HOSPITAL_COMMUNITY)
Admission: RE | Admit: 2013-01-15 | Discharge: 2013-01-15 | Disposition: A | Discharge status: Home / Self Care | Payer: Medicare Other | Source: Ambulatory Visit | Attending: Oncology | Admitting: Oncology

## 2013-01-15 ENCOUNTER — Telehealth: Payer: Self-pay | Admitting: *Deleted

## 2013-01-15 ENCOUNTER — Encounter (INDEPENDENT_AMBULATORY_CARE_PROVIDER_SITE_OTHER): Payer: Self-pay

## 2013-01-15 DIAGNOSIS — J984 Other disorders of lung: Secondary | ICD-10-CM | POA: Insufficient documentation

## 2013-01-15 DIAGNOSIS — C50919 Malignant neoplasm of unspecified site of unspecified female breast: Secondary | ICD-10-CM

## 2013-01-15 DIAGNOSIS — M7989 Other specified soft tissue disorders: Secondary | ICD-10-CM | POA: Insufficient documentation

## 2013-01-15 DIAGNOSIS — I7090 Unspecified atherosclerosis: Secondary | ICD-10-CM | POA: Insufficient documentation

## 2013-01-15 DIAGNOSIS — K449 Diaphragmatic hernia without obstruction or gangrene: Secondary | ICD-10-CM | POA: Insufficient documentation

## 2013-01-15 DIAGNOSIS — Z9010 Acquired absence of unspecified breast and nipple: Secondary | ICD-10-CM | POA: Insufficient documentation

## 2013-01-15 DIAGNOSIS — Z9221 Personal history of antineoplastic chemotherapy: Secondary | ICD-10-CM | POA: Insufficient documentation

## 2013-01-15 LAB — CBC WITH DIFFERENTIAL/PLATELET
BASO%: 1 % (ref 0.0–2.0)
HCT: 28.9 % — ABNORMAL LOW (ref 34.8–46.6)
LYMPH%: 8.5 % — ABNORMAL LOW (ref 14.0–49.7)
MCH: 22.1 pg — ABNORMAL LOW (ref 25.1–34.0)
MCHC: 31 g/dL — ABNORMAL LOW (ref 31.5–36.0)
MONO#: 1.3 10*3/uL — ABNORMAL HIGH (ref 0.1–0.9)
NEUT#: 10.8 10*3/uL — ABNORMAL HIGH (ref 1.5–6.5)
NEUT%: 79.8 % — ABNORMAL HIGH (ref 38.4–76.8)
RDW: 18.7 % — ABNORMAL HIGH (ref 11.2–14.5)
WBC: 13.6 10*3/uL — ABNORMAL HIGH (ref 3.9–10.3)
lymph#: 1.2 10*3/uL (ref 0.9–3.3)

## 2013-01-15 LAB — COMPREHENSIVE METABOLIC PANEL (CC13)
ALT: 19 U/L (ref 0–55)
Albumin: 3.5 g/dL (ref 3.5–5.0)
Alkaline Phosphatase: 97 U/L (ref 40–150)
Anion Gap: 16 mEq/L — ABNORMAL HIGH (ref 3–11)
CO2: 28 mEq/L (ref 22–29)
Calcium: 9.9 mg/dL (ref 8.4–10.4)
Chloride: 90 mEq/L — ABNORMAL LOW (ref 98–109)
Creatinine: 1.1 mg/dL (ref 0.6–1.1)
Potassium: 2.7 mEq/L — CL (ref 3.5–5.1)
Sodium: 134 mEq/L — ABNORMAL LOW (ref 136–145)
Total Protein: 7.2 g/dL (ref 6.4–8.3)

## 2013-01-15 LAB — LACTATE DEHYDROGENASE (CC13): LDH: 280 U/L — ABNORMAL HIGH (ref 125–245)

## 2013-01-15 MED ORDER — FLUDEOXYGLUCOSE F - 18 (FDG) INJECTION
17.2000 | Freq: Once | INTRAVENOUS | Status: AC | PRN
  Administered 2013-01-15: 17.2 via INTRAVENOUS

## 2013-01-15 NOTE — Telephone Encounter (Signed)
Pt in for labs & PET scan.  Noted K+ low & called PET scan dept & spoke to pt & she states she is on K+ 20 meq bid & sometimes takes tid but is also on lasix, spironolactone & hctz.  She reports fever of 103 & thinks it is coming from spider bite to her arm that has been treated.  She also states that she is supposed to have surgery tomorrow for her sinuses.  Encouraged to go to ED due to fever & low K+.  She is unsure which ED she will go to but states she needs to go home & take care of her dog & will probably go to [facility].  This was discussed with Lonna Cobb NP.  Labs routed to Dr .Reche Dixon in [facility], her PCP.

## 2013-01-17 ENCOUNTER — Telehealth: Payer: Self-pay | Admitting: *Deleted

## 2013-01-17 NOTE — Telephone Encounter (Signed)
Spoke with patient.  Let her know cancer remains in remission.. She appreciated the call.  She is seeing a new PCP - Dr. Reche Dixon in [facility]. [facility]  She is getting levaquin IV for sinus infection and pre-op for sinus surgery.  She said it should help the abscess in her arm.

## 2013-01-17 NOTE — Telephone Encounter (Signed)
Message copied by Orbie Hurst on Thu Jan 17, 2013  4:39 PM ------      Message from: [name] Carr      Created: Wed Jan 16, 2013  8:14 PM       Call pt: cancer remains in remission!!!!  All her infections must have killed it ------

## 2013-01-23 ENCOUNTER — Other Ambulatory Visit: Payer: Self-pay | Admitting: Oncology

## 2013-01-24 ENCOUNTER — Telehealth: Payer: Self-pay | Admitting: Oncology

## 2013-01-24 NOTE — Telephone Encounter (Signed)
lvm for pt regarding to Jan apt....mailed pt appt sche//avs and letter

## 2013-02-12 ENCOUNTER — Encounter (HOSPITAL_COMMUNITY): Payer: Self-pay | Admitting: Emergency Medicine

## 2013-02-12 ENCOUNTER — Emergency Department (HOSPITAL_COMMUNITY): Payer: Medicare Other

## 2013-02-12 ENCOUNTER — Emergency Department (HOSPITAL_COMMUNITY)
Admission: EM | Admit: 2013-02-12 | Discharge: 2013-02-12 | Disposition: A | Discharge status: Rehabilitation Facility | Payer: Medicare Other | Attending: Emergency Medicine | Admitting: Emergency Medicine

## 2013-02-12 DIAGNOSIS — M064 Inflammatory polyarthropathy: Secondary | ICD-10-CM | POA: Insufficient documentation

## 2013-02-12 DIAGNOSIS — M329 Systemic lupus erythematosus, unspecified: Secondary | ICD-10-CM | POA: Insufficient documentation

## 2013-02-12 DIAGNOSIS — Z86718 Personal history of other venous thrombosis and embolism: Secondary | ICD-10-CM | POA: Insufficient documentation

## 2013-02-12 DIAGNOSIS — Z853 Personal history of malignant neoplasm of breast: Secondary | ICD-10-CM | POA: Insufficient documentation

## 2013-02-12 DIAGNOSIS — Z8505 Personal history of malignant neoplasm of liver: Secondary | ICD-10-CM | POA: Insufficient documentation

## 2013-02-12 DIAGNOSIS — G589 Mononeuropathy, unspecified: Secondary | ICD-10-CM | POA: Insufficient documentation

## 2013-02-12 DIAGNOSIS — M1990 Unspecified osteoarthritis, unspecified site: Secondary | ICD-10-CM | POA: Insufficient documentation

## 2013-02-12 DIAGNOSIS — E039 Hypothyroidism, unspecified: Secondary | ICD-10-CM | POA: Insufficient documentation

## 2013-02-12 DIAGNOSIS — E2749 Other adrenocortical insufficiency: Secondary | ICD-10-CM | POA: Insufficient documentation

## 2013-02-12 DIAGNOSIS — R9431 Abnormal electrocardiogram [ECG] [EKG]: Secondary | ICD-10-CM | POA: Insufficient documentation

## 2013-02-12 DIAGNOSIS — F411 Generalized anxiety disorder: Secondary | ICD-10-CM | POA: Insufficient documentation

## 2013-02-12 DIAGNOSIS — F3289 Other specified depressive episodes: Secondary | ICD-10-CM | POA: Insufficient documentation

## 2013-02-12 DIAGNOSIS — Z8669 Personal history of other diseases of the nervous system and sense organs: Secondary | ICD-10-CM | POA: Insufficient documentation

## 2013-02-12 DIAGNOSIS — IMO0002 Reserved for concepts with insufficient information to code with codable children: Secondary | ICD-10-CM | POA: Insufficient documentation

## 2013-02-12 DIAGNOSIS — Z79899 Other long term (current) drug therapy: Secondary | ICD-10-CM | POA: Insufficient documentation

## 2013-02-12 DIAGNOSIS — D509 Iron deficiency anemia, unspecified: Secondary | ICD-10-CM | POA: Insufficient documentation

## 2013-02-12 DIAGNOSIS — F609 Personality disorder, unspecified: Secondary | ICD-10-CM | POA: Insufficient documentation

## 2013-02-12 DIAGNOSIS — G894 Chronic pain syndrome: Secondary | ICD-10-CM | POA: Insufficient documentation

## 2013-02-12 DIAGNOSIS — K219 Gastro-esophageal reflux disease without esophagitis: Secondary | ICD-10-CM | POA: Insufficient documentation

## 2013-02-12 DIAGNOSIS — Z888 Allergy status to other drugs, medicaments and biological substances status: Secondary | ICD-10-CM | POA: Insufficient documentation

## 2013-02-12 DIAGNOSIS — J309 Allergic rhinitis, unspecified: Secondary | ICD-10-CM | POA: Insufficient documentation

## 2013-02-12 DIAGNOSIS — R569 Unspecified convulsions: Secondary | ICD-10-CM | POA: Insufficient documentation

## 2013-02-12 DIAGNOSIS — R0602 Shortness of breath: Secondary | ICD-10-CM | POA: Insufficient documentation

## 2013-02-12 DIAGNOSIS — Z8614 Personal history of Methicillin resistant Staphylococcus aureus infection: Secondary | ICD-10-CM | POA: Insufficient documentation

## 2013-02-12 DIAGNOSIS — E119 Type 2 diabetes mellitus without complications: Secondary | ICD-10-CM | POA: Insufficient documentation

## 2013-02-12 DIAGNOSIS — R0789 Other chest pain: Secondary | ICD-10-CM

## 2013-02-12 DIAGNOSIS — E785 Hyperlipidemia, unspecified: Secondary | ICD-10-CM | POA: Insufficient documentation

## 2013-02-12 DIAGNOSIS — R071 Chest pain on breathing: Secondary | ICD-10-CM | POA: Insufficient documentation

## 2013-02-12 DIAGNOSIS — F329 Major depressive disorder, single episode, unspecified: Secondary | ICD-10-CM | POA: Insufficient documentation

## 2013-02-12 DIAGNOSIS — R509 Fever, unspecified: Secondary | ICD-10-CM | POA: Insufficient documentation

## 2013-02-12 DIAGNOSIS — Z8619 Personal history of other infectious and parasitic diseases: Secondary | ICD-10-CM | POA: Insufficient documentation

## 2013-02-12 LAB — TROPONIN I
Troponin I: <0.3 ng/mL (ref <0.30)
Troponin I: <0.3 ng/mL (ref <0.30)

## 2013-02-12 LAB — CBC WITH DIFFERENTIAL/PLATELET
BASOS PCT: 1 % (ref 0–1)
Basophils Absolute: 0.1 10*3/uL (ref 0.0–0.1)
EOS ABS: 0.1 10*3/uL (ref 0.0–0.7)
EOS PCT: 1 % (ref 0–5)
HEMATOCRIT: 29.6 % — AB (ref 36.0–46.0)
Hemoglobin: 8.9 g/dL — ABNORMAL LOW (ref 12.0–15.0)
Lymphocytes Relative: 20 % (ref 12–46)
Lymphs Abs: 1.9 10*3/uL (ref 0.7–4.0)
MCH: 22.4 pg — AB (ref 26.0–34.0)
MCHC: 30.1 g/dL (ref 30.0–36.0)
MCV: 74.6 fL — AB (ref 78.0–100.0)
Monocytes Absolute: 0.8 10*3/uL (ref 0.1–1.0)
Monocytes Relative: 8 % (ref 3–12)
NEUTROS ABS: 6.5 10*3/uL (ref 1.7–7.7)
Neutrophils Relative %: 70 % (ref 43–77)
Platelets: 467 10*3/uL — ABNORMAL HIGH (ref 150–400)
RBC: 3.97 MIL/uL (ref 3.87–5.11)
RDW: 20.7 % — ABNORMAL HIGH (ref 11.5–15.5)
WBC: 9.4 10*3/uL (ref 4.0–10.5)

## 2013-02-12 LAB — COMPREHENSIVE METABOLIC PANEL
ALBUMIN: 2.9 g/dL — AB (ref 3.5–5.2)
ALT: 25 U/L (ref 0–35)
AST: 15 U/L (ref 0–37)
Alkaline Phosphatase: 139 U/L — ABNORMAL HIGH (ref 39–117)
BUN: 19 mg/dL (ref 6–23)
CALCIUM: 9 mg/dL (ref 8.4–10.5)
CO2: 23 mEq/L (ref 19–32)
CREATININE: 0.68 mg/dL (ref 0.50–1.10)
Chloride: 98 mEq/L (ref 96–112)
GFR calc Af Amer: >90 mL/min (ref >90)
GFR calc non Af Amer: >90 mL/min (ref >90)
Glucose, Bld: 129 mg/dL — ABNORMAL HIGH (ref 70–99)
Potassium: 3.9 mEq/L (ref 3.7–5.3)
Sodium: 137 mEq/L (ref 137–147)
Total Bilirubin: <0.2 mg/dL — ABNORMAL LOW (ref 0.3–1.2)
Total Protein: 6.7 g/dL (ref 6.0–8.3)

## 2013-02-12 MED ORDER — MORPHINE SULFATE 4 MG/ML IJ SOLN
4.0000 mg | Freq: Once | INTRAMUSCULAR | Status: AC
  Administered 2013-02-12: 4 mg via INTRAVENOUS
  Filled 2013-02-12: qty 1

## 2013-02-12 MED ORDER — HYDROCODONE-ACETAMINOPHEN 5-325 MG PO TABS: 1.0000 | ORAL_TABLET | Freq: Four times a day (QID) | ORAL | Status: DC | PRN

## 2013-02-12 NOTE — ED Provider Notes (Signed)
CSN: [id number]     Arrival date & time 02/12/13  0334 History   First MD Initiated Contact with Patient 02/12/13 0501     Chief Complaint  Patient presents with  . Chest Pain  . Shortness of Breath  . Nausea   (Consider location/radiation/quality/duration/timing/severity/associated sxs/prior Treatment) HPI  This a 58 year old female with a history of diabetes, hyperlipidemia, and lupus who presents with chest pain. Patient was just discharged from [facility] with MRSA bacteremia.  She is currently getting vancomycin. Patient reports onset of sternal, nonradiating chest pain at 11 PM. She reports that it is worse with movement. She also reports subjective fever, chills, and shortness of breath. Patient has a history of pericarditis but states that this is somewhat different. Patient was given aspirin and nitroglycerin in route without improvement of her pain.  She currently rates her pain at 8/10. She states that it is sharp and constant.   Past Medical History  Diagnosis Date  . PERSONALITY DISORDER   . Chronic pain syndrome     chronic narcotics, dependancy hx - [place] for pain mgmt  . MIGRAINE HEADACHE   . Lyme disease   . ALLERGIC RHINITIS   . ANEMIA-IRON DEFICIENCY   . HYPERLIPIDEMIA   . GERD   . DIABETES MELLITUS, TYPE II   . DEPRESSION   . SLE (systemic lupus erythematosus)     [facility] - chronic pred  . Psoriasis   . Addison's disease   . Degenerative joint disease   . DDD (degenerative disc disease)   . Phlebitis after infusion     left ?calf; "S/P phenergan/demerol injection"  . Blood clot in vein 2011; 02/04/11    "right jugular vein;left jugular vein"  . Hypothyroidism   . SEIZURE DISORDER     "lots of seizures; due to lupus"  . Anxiety   . Neuropathy     hands, feet, due to diabetes & back problem  . Surgical wound infection 02/15/2011    R shoulder and mastectomy site - related to PICC  . CARCINOMA, BREAST 08/2009 dx    R  breast,  mets to liver - resolution s/p chemo, s/p B mastect  . Metastases to the liver   . RA (rheumatoid arthritis)    Past Surgical History  Procedure Laterality Date  . Liver bopsy  09/2009  . Port a cath placement  09/2009; 11/19/10  . Port-a-cath removal  04/2010    "due to staph & strept"  . Port-a-cath removal  12/23/2010    Procedure: REMOVAL PORT-A-CATH;  Surgeon: Pedro Earls, MD;  Location: [facility];  Service: General;  Laterality: Left;  . Breast surgery  09/2009    right breast biopsy  . Mastectomy  11/19/10    bilateral mastectomy   . I&d extremity  ~ 2010    right hand; S/P "dog bite"  . Peripherally inserted central catheter insertion    . Spider bite     Family History  Problem Relation Age of Onset  . Lung cancer Mother   . Lung cancer Father   . Arthritis Other   . Diabetes Other   . Hyperlipidemia Other    History  Substance Use Topics  . Smoking status: Never Smoker   . Smokeless tobacco: Never Used  . Alcohol Use: No   OB History   Grav Para Term Preterm Abortions TAB SAB Ect Mult Living  Review of Systems  Constitutional: Positive for chills. Negative for fever.  Respiratory: Positive for shortness of breath. Negative for cough and chest tightness.   Cardiovascular: Positive for chest pain. Negative for leg swelling.  Gastrointestinal: Negative for nausea, vomiting and abdominal pain.  Genitourinary: Negative for dysuria.  Musculoskeletal: Negative for back pain.  Skin: Negative for wound.  Neurological: Negative for headaches.  Psychiatric/Behavioral: Negative for confusion.  All other systems reviewed and are negative.    Allergies  Ammonia; Contrast media; Iohexol; Methotrexate; Midazolam hcl; Nalbuphine; Cephalexin; Infliximab; Ketorolac tromethamine; Nsaids; and Celecoxib  Home Medications   Current Outpatient Rx  Name  Route  Sig  Dispense  Refill  . bisacodyl (BISACODYL) 5 MG EC tablet   Oral   Take 5 mg by  mouth daily as needed for moderate constipation.         . butalbital-acetaminophen-caffeine (FIORICET, ESGIC) 50-325-40 MG per tablet   Oral   Take 1 tablet by mouth 2 (two) times daily as needed for headache.         . carisoprodol (SOMA) 350 MG tablet   Oral   Take 1 tablet (350 mg total) by mouth 3 (three) times daily as needed for muscle spasms. Muscle pain   30 tablet   1   . clonazePAM (KLONOPIN) 1 MG tablet   Oral   Take 1 tablet (1 mg total) by mouth 2 (two) times daily as needed for anxiety.   30 tablet   2   . docusate sodium (COLACE) 100 MG capsule   Oral   Take 200 mg by mouth 2 (two) times daily.          . DULoxetine (CYMBALTA) 60 MG capsule   Oral   Take 1 capsule (60 mg total) by mouth 2 (two) times daily.   180 capsule   1   . furosemide (LASIX) 20 MG tablet   Oral   Take 20 mg by mouth daily.         Marland Kitchen gabapentin (NEURONTIN) 300 MG capsule   Oral   Take 3 capsules (900 mg total) by mouth 3 (three) times daily.   810 capsule   0   . Lactobacillus (ACIDOPHILUS PO)   Oral   Take 3 capsules by mouth 3 (three) times daily.         . metoprolol tartrate (LOPRESSOR) 25 MG tablet   Oral   Take 12.5 mg by mouth 2 (two) times daily.         Marland Kitchen OLANZapine (ZYPREXA) 5 MG tablet   Oral   Take 5 mg by mouth 3 (three) times daily.         . pantoprazole (PROTONIX) 40 MG tablet   Oral   Take 40 mg by mouth daily.         . potassium chloride SA (K-DUR,KLOR-CON) 20 MEQ tablet   Oral   Take 20 mEq by mouth daily.         . QUEtiapine (SEROQUEL) 25 MG tablet   Oral   Take 25 mg by mouth 3 (three) times daily.         Marland Kitchen rOPINIRole (REQUIP) 1 MG tablet   Oral   Take 1 mg by mouth at bedtime.         . senna-docusate (SENOKOT-S) 8.6-50 MG per tablet   Oral   Take 1 tablet by mouth 2 (two) times daily.         . sodium chloride 0.9 %  SOLN 250 mL with vancomycin 1000 MG SOLR 1,000 mg   Intravenous   Inject 1,000 mg into the  vein every 12 (twelve) hours.         Marland Kitchen HYDROcodone-acetaminophen (NORCO/VICODIN) 5-325 MG per tablet   Oral   Take 1 tablet by mouth every 6 (six) hours as needed.   10 tablet   0    BP 130/45  Pulse 100  Temp(Src) 98.7 F (37.1 C) (Oral)  Resp 12  SpO2 95% Physical Exam  Nursing note and vitals reviewed. Constitutional: She is oriented to person, place, and time. No distress.  HENT:  Head: Normocephalic and atraumatic.  Mouth/Throat: Oropharynx is clear and moist.  Eyes: Pupils are equal, round, and reactive to light.  Neck: Neck supple. No JVD present.  Cardiovascular: Normal rate, regular rhythm and normal heart sounds.  Exam reveals no friction rub.   No murmur heard. Pulmonary/Chest: Effort normal and breath sounds normal. No respiratory distress. She has no wheezes. She exhibits tenderness.  Tenderness to palpation of the anterior chest wall  Abdominal: Soft. There is no tenderness.  Musculoskeletal: She exhibits no edema.  Neurological: She is alert and oriented to person, place, and time.  Skin: Skin is warm and dry.  Psychiatric: She has a normal mood and affect.    ED Course  Procedures (including critical care time) Labs Review Labs Reviewed  COMPREHENSIVE METABOLIC PANEL - Abnormal; Notable for the following:    Glucose, Bld 129 (*)    Albumin 2.9 (*)    Alkaline Phosphatase 139 (*)    Total Bilirubin <0.2 (*)    All other components within normal limits  CBC WITH DIFFERENTIAL - Abnormal; Notable for the following:    Hemoglobin 8.9 (*)    HCT 29.6 (*)    MCV 74.6 (*)    MCH 22.4 (*)    RDW 20.7 (*)    Platelets 467 (*)    All other components within normal limits  TROPONIN I  TROPONIN I   Imaging Review Dg Chest 2 View  02/12/2013   CLINICAL DATA:  Chest pain, shortness of breath.  EXAM: CHEST  2 VIEW  COMPARISON:  Chest radiograph January 28, 2013  FINDINGS: Cardiomediastinal silhouette is unremarkable. The lungs are clear without pleural  effusions or focal consolidations. Pulmonary vasculature is unremarkable. Trachea projects midline and there is no pneumothorax. Soft tissue planes and included osseous structures are nonsuspicious. Mild mid thoracic dextroscoliosis. Interval removal of left PICC. Catheter projects over the left chest.  IMPRESSION: No acute cardiopulmonary process.   Electronically Signed   By: Elon Alas   On: 02/12/2013 04:47    EKG Interpretation    Date/Time:  Tuesday February 12 2013 03:42:42 EST Ventricular Rate:  100 PR Interval:  138 QRS Duration: 84 QT Interval:  352 QTC Calculation: 454 R Axis:   -17 Text Interpretation:  Normal sinus rhythm Inferior infarct , age undetermined Cannot rule out Anterior infarct , age undetermined Abnormal ECG Confirmed by Jilleen Essner  MD, Shalah Estelle ([id number]) on 02/12/2013 4:59:27 AM           Medications  morphine 4 MG/ML injection 4 mg (4 mg Intravenous Given 02/12/13 0553)    MDM   1. Chest wall pain    Patient presents with chest pain. Patient is sharp and reproducible on exam. It is atypical for ACS. EKG is reassuring and unchanged. The patient does have a history of pericarditis but is on chronic prednisone for this and there  is no evidence of PR depression or diffuse elevation on EKG. Patient has tenderness to palpation of the anterior chest wall. She states that she has had costochondritis in the past and that this is similar. She does not tolerate anti-inflammatories and was given morphine with improvement of her pain. Delta troponin is negative.  A chest x-ray is reassuring. She is afebrile. Given patient's physical exam, I suspect patient's pain is likely secondary to musculoskeletal etiology. Her risk factors for ACS including hypertension and diabetes. She is overall low risk. Patient will be referred back to her primary care physician in to set up for stress testing on an outpatient basis.  Patient will be given a short course of pain medication as she  does not tolerate anti-inflammatories. Of note, patient does have chronic pain and has seen pain management in the past.  After history, exam, and medical workup I feel the patient has been appropriately medically screened and is safe for discharge home. Pertinent diagnoses were discussed with the patient. Patient was given return precautions.     Merryl Hacker, MD 02/12/13 [phone]

## 2013-02-12 NOTE — ED Notes (Signed)
D/c'd today from [facility] to [facility] rehab to finish abx for blood stream MRSA. Did not have CP at time of d/c. Developed CP ~ 2 hrs ago. "substernal radiating to jaw", "some chest discomfort yesterday"Also reports subjective fever, nausea and some sob. (denies: known fever, vd, bleeding, cough congestion cold sx), here by [facility], NSL 22g L hand started PTA, 2 ntg SL given buy rehab facility, 324 ASA given by EMS PTA, alert, NAD, calm, interactive, LS CTA. EMS EKG on chart, also reports chronic low back pain. H/o DM, lupus,  RA. Alert, NAD, calm, interactive, skin W&D, resps e/u, speaking in clear complete sentences.

## 2013-02-12 NOTE — ED Notes (Signed)
PTAR at bedside 

## 2013-02-12 NOTE — Discharge Instructions (Signed)

## 2013-02-15 ENCOUNTER — Ambulatory Visit: Payer: Self-pay | Admitting: Oncology

## 2013-02-23 ENCOUNTER — Telehealth: Payer: Self-pay | Admitting: Oncology

## 2013-02-23 NOTE — Telephone Encounter (Signed)
Talked to pt and gave her appt for February 2015 md only per Dr. Darnell Level, pt needs ride so she wants later in the day and needs 3 weeks to notify ride , notified nurse

## 2013-02-26 ENCOUNTER — Ambulatory Visit: Payer: Self-pay | Admitting: Oncology

## 2013-02-28 ENCOUNTER — Other Ambulatory Visit: Payer: Self-pay | Admitting: Internal Medicine

## 2013-03-04 ENCOUNTER — Telehealth: Payer: Self-pay | Admitting: Oncology

## 2013-03-04 NOTE — Telephone Encounter (Signed)
gave pt appt for February , checked with nurse if pt needs lab for visit

## 2013-03-06 ENCOUNTER — Telehealth: Payer: Self-pay | Admitting: *Deleted

## 2013-03-06 NOTE — Telephone Encounter (Signed)
Lm informed the pt that G has death in the family. gv appt for 03/11/13@ 3:15pm to see LT...td

## 2013-03-08 ENCOUNTER — Telehealth: Payer: Self-pay | Admitting: Oncology

## 2013-03-08 NOTE — Telephone Encounter (Signed)
Gave pt appt for lab and MD , cancelled appt with ML on 2/2/

## 2013-03-11 ENCOUNTER — Ambulatory Visit: Payer: Self-pay | Admitting: Nurse Practitioner

## 2013-03-26 ENCOUNTER — Other Ambulatory Visit: Payer: Self-pay | Admitting: Internal Medicine

## 2013-04-04 ENCOUNTER — Other Ambulatory Visit: Payer: Self-pay | Admitting: Internal Medicine

## 2013-04-06 ENCOUNTER — Other Ambulatory Visit: Payer: Self-pay | Admitting: Internal Medicine

## 2013-04-08 ENCOUNTER — Other Ambulatory Visit (HOSPITAL_BASED_OUTPATIENT_CLINIC_OR_DEPARTMENT_OTHER): Payer: Medicare Other

## 2013-04-08 ENCOUNTER — Encounter (INDEPENDENT_AMBULATORY_CARE_PROVIDER_SITE_OTHER): Payer: Self-pay

## 2013-04-08 ENCOUNTER — Ambulatory Visit (HOSPITAL_BASED_OUTPATIENT_CLINIC_OR_DEPARTMENT_OTHER): Payer: Medicare Other | Admitting: Oncology

## 2013-04-08 ENCOUNTER — Telehealth: Payer: Self-pay | Admitting: Oncology

## 2013-04-08 VITALS — BP 139/59 | HR 95 | Temp 97.4°F | Resp 18 | Ht 65.0 in | Wt 203.0 lb

## 2013-04-08 DIAGNOSIS — G8929 Other chronic pain: Secondary | ICD-10-CM

## 2013-04-08 DIAGNOSIS — R609 Edema, unspecified: Secondary | ICD-10-CM

## 2013-04-08 DIAGNOSIS — D509 Iron deficiency anemia, unspecified: Secondary | ICD-10-CM

## 2013-04-08 DIAGNOSIS — Z171 Estrogen receptor negative status [ER-]: Secondary | ICD-10-CM

## 2013-04-08 DIAGNOSIS — I82C19 Acute embolism and thrombosis of unspecified internal jugular vein: Secondary | ICD-10-CM

## 2013-04-08 DIAGNOSIS — C787 Secondary malignant neoplasm of liver and intrahepatic bile duct: Secondary | ICD-10-CM

## 2013-04-08 DIAGNOSIS — C50919 Malignant neoplasm of unspecified site of unspecified female breast: Secondary | ICD-10-CM

## 2013-04-08 DIAGNOSIS — M310 Hypersensitivity angiitis: Secondary | ICD-10-CM

## 2013-04-08 LAB — COMPREHENSIVE METABOLIC PANEL (CC13)
ALBUMIN: 3.4 g/dL — AB (ref 3.5–5.0)
ALT: 18 U/L (ref 0–55)
ANION GAP: 10 meq/L (ref 3–11)
AST: 17 U/L (ref 5–34)
Alkaline Phosphatase: 100 U/L (ref 40–150)
BILIRUBIN TOTAL: 0.34 mg/dL (ref 0.20–1.20)
BUN: 29.9 mg/dL — ABNORMAL HIGH (ref 7.0–26.0)
CO2: 28 meq/L (ref 22–29)
Calcium: 9.6 mg/dL (ref 8.4–10.4)
Chloride: 97 mEq/L — ABNORMAL LOW (ref 98–109)
Creatinine: 1.2 mg/dL — ABNORMAL HIGH (ref 0.6–1.1)
GLUCOSE: 268 mg/dL — AB (ref 70–140)
Potassium: 4.6 mEq/L (ref 3.5–5.1)
Sodium: 135 mEq/L — ABNORMAL LOW (ref 136–145)
Total Protein: 6.9 g/dL (ref 6.4–8.3)

## 2013-04-08 LAB — CBC WITH DIFFERENTIAL/PLATELET
BASO%: 0.5 % (ref 0.0–2.0)
BASOS ABS: 0.1 10*3/uL (ref 0.0–0.1)
EOS ABS: 0.1 10*3/uL (ref 0.0–0.5)
EOS%: 0.7 % (ref 0.0–7.0)
HCT: 27.7 % — ABNORMAL LOW (ref 34.8–46.6)
HEMOGLOBIN: 8.4 g/dL — AB (ref 11.6–15.9)
LYMPH%: 5.5 % — AB (ref 14.0–49.7)
MCH: 22 pg — ABNORMAL LOW (ref 25.1–34.0)
MCHC: 30.4 g/dL — ABNORMAL LOW (ref 31.5–36.0)
MCV: 72.4 fL — ABNORMAL LOW (ref 79.5–101.0)
MONO#: 0.6 10*3/uL (ref 0.1–0.9)
MONO%: 5.5 % (ref 0.0–14.0)
NEUT%: 87.8 % — ABNORMAL HIGH (ref 38.4–76.8)
NEUTROS ABS: 9.4 10*3/uL — AB (ref 1.5–6.5)
PLATELETS: 289 10*3/uL (ref 145–400)
RBC: 3.83 10*6/uL (ref 3.70–5.45)
RDW: 20.3 % — ABNORMAL HIGH (ref 11.2–14.5)
WBC: 10.7 10*3/uL — AB (ref 3.9–10.3)
lymph#: 0.6 10*3/uL — ABNORMAL LOW (ref 0.9–3.3)

## 2013-04-08 LAB — IRON AND TIBC CHCC
%SAT: 12 % — ABNORMAL LOW (ref 21–57)
Iron: 51 ug/dL (ref 41–142)
TIBC: 434 ug/dL (ref 236–444)
UIBC: 383 ug/dL (ref 120–384)

## 2013-04-08 LAB — FERRITIN CHCC: Ferritin: 19 ng/ml (ref 9–269)

## 2013-04-08 NOTE — Progress Notes (Signed)
Hematology and Oncology Follow Up Visit  [name] Carr VN:[id number] 18-Nov-1955 58 y.o. 04/08/2013 6:59 PM   Principle Diagnosis: Encounter Diagnoses  Name Primary?  . Iron deficiency anemia, unspecified Yes  . Breats Ca, 08/2009 - hx liver mets, resolved s/p chemo      Interim History:  We have not seen [name] Carr in one year although she has been in constant contact with our office. She has had to cancel multiple scheduled appointments. She has had multiple hospitalizations primarily at [facility] for recurrent infections. She spent some time here in [place] recuperating on the chronic care unit at [facility]. After multiple attempts, we were finally able to get a followup PET scan done on 01/15/2013 which showed no evidence for active breast cancer. She has a most remarkable history. She was Initially diagnosed with breast cancer in August 2011. She had a large lesion in the right breast. Biopsies on 08/27/2009 showed high-grade invasive carcinoma, triple negative. PET scan showed activity over the breast lesion and, in addition several small lesions seen in the right lobe of the liver with the largest measuring 1.2 x 0.6 cm. Liver biopsy 09/21/2009 was positive for adenocarcinoma. She received initial chemotherapy with Cytoxan plus Taxotere for 3 cycles with a dramatic response but poor tolerance. She was treated with Abraxane between 12/07/2009 and 04/27/2010 when she elected to stop all treatments due to poor tolerance. She was discharged from the oncology practice in [facility] for drug seeking behavior. She first saw me on 02/04/2011. She was put on a brief trial of oral Xeloda which was also stopped for poor tolerance. CT scan of the abdomen and pelvis done 06/24/2010 showed a normal-appearing liver without any lesions. Mammograms and MRI of the breast showed residual tumor in the right breast. She elected to have bilateral mastectomies (11/19/2010). She had a number  postoperative complications with wound infection and infection at the site of a Port-A-Cath infusion device that was placed at the time of the initial surgery. She had multiple hospitalizations related to the infections and additional infections of a PICC catheter placed after the Port-A-Cath was removed.   She has received no specific therapy for breast cancer for over 2 years and remains in a radiographic remission.  She recently got established with a new primary Care physician Dr. Lysle Pearl and is very happy with him. She also just started seeing a new rheumatologist Dr Georgina Pillion? In [facility].  Her main complaint today is excessive fluid accumulation. She is on 3 diuretics Lasix, hydrochlorothiazide, and Aldactazide. I told her to stop the hydrochlorothiazide since the Aldactazide contains this medication already. She has no signs or symptoms of congestive that year. She never received any anthracyclines for her breast cancer treatments. An echocardiogram on file from 08/14/2009 was normal at that time but the ejection fraction 55-60% and normal left ventricular wall motion. Mild mitral regurgitation. A proBNP recorded on 11/11/2012 was 462 units range 0-125.  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 No new or focal bone pain. She has been able to get off most of her narcotic analgesics which is a major step forward for this lady. She is still using when necessary Vicodin.  Lab today shows a chronic microcytic anemia. She states that she has been unable to tolerate oral iron preparations. Obtain iron studies today. Results came back after the patient left the office. Serum iron actually low normal at 51, TIBC 434, ferritin 19.  Medications: reviewed  Allergies:  Allergies  Allergen Reactions  . Ammonia Other (See Comments)    Ended up on ventilator from ammonia tabs  . Contrast Media [Iodinated Diagnostic Agents] Other (See  Comments)    Doesn't breath well.  . Iohexol Other (See Comments)    SOB   . Methotrexate Anaphylaxis  . Midazolam Hcl Anaphylaxis  . Nalbuphine Other (See Comments)    Can't breathe well  . Infliximab Nausea And Vomiting  . Ketorolac Tromethamine Other (See Comments)    Injectable doesn't work and pill hurts stomach.  . Nsaids Nausea And Vomiting  . Celecoxib Rash    Review of Systems: Hematology:  No bleeding or bruising ENT ROS: No sore throat Breast ROS:  Respiratory ROS: No cough or dyspnea  Cardiovascular ROS: No ischemic type chest pain or palpitations  Gastrointestinal ROS:  No abdominal pain or change in bowel habit  Genito-Urinary ROS: No urinary tract symptoms Musculoskeletal ROS: Chronic joint pain Neurological ROS: No headache or change in vision Dermatological ROS: No rash Remaining ROS negative:   Physical Exam: Blood pressure 139/59, pulse 95, temperature 97.4 F (36.3 C), temperature source Oral, resp. rate 18, height 5\' 5"  (1.651 m), weight 203 lb (92.08 kg), SpO2 100.00%. Wt Readings from Last 3 Encounters:  04/08/13 203 lb (92.08 kg)  12/12/12 166 lb 8 oz (75.524 kg)  05/24/12 159 lb (72.122 kg)     General appearance: Well-nourished Caucasian woman HENNT: Pharynx no erythema, exudate, mass, or ulcer. No thyromegaly or thyroid nodules Lymph nodes: No cervical, supraclavicular, or axillary lymphadenopathy Breasts: Bilateral mastectomies. No chest wall lesions. Lungs: Clear to auscultation, resonant to percussion throughout Heart: Regular rhythm, no murmur, no gallop, no rub, no click, no edema Abdomen: Soft, nontender, normal bowel sounds, no mass, no organomegaly Extremities: 1+ brawny ankles edema, no calf tenderness Musculoskeletal: no joint deformities GU:  Vascular: Carotid pulses 2+, no bruits Neurologic: Alert, oriented, PERRLA, exudate, cranial nerves grossly normal, motor strength 5 over 5, reflexes 1+ symmetric, upper body coordination  normal, gait normal, Skin: No rash or ecchymosis  Lab Results: CBC W/Diff    Component Value Date/Time   WBC 10.7* 04/08/2013 1148   WBC 9.4 02/12/2013 0352   RBC 3.83 04/08/2013 1148   RBC 3.97 02/12/2013 0352   RBC 4.52 05/05/2009 1211   HGB 8.4* 04/08/2013 1148   HGB 8.9* 02/12/2013 0352   HCT 27.7* 04/08/2013 1148   HCT 29.6* 02/12/2013 0352   PLT 289 04/08/2013 1148   PLT 467* 02/12/2013 0352   MCV 72.4* 04/08/2013 1148   MCV 74.6* 02/12/2013 0352   MCH 22.0* 04/08/2013 1148   MCH 22.4* 02/12/2013 0352   MCHC 30.4* 04/08/2013 1148   MCHC 30.1 02/12/2013 0352   RDW 20.3* 04/08/2013 1148   RDW 20.7* 02/12/2013 0352   LYMPHSABS 0.6* 04/08/2013 1148   LYMPHSABS 1.9 02/12/2013 0352   MONOABS 0.6 04/08/2013 1148   MONOABS 0.8 02/12/2013 0352   EOSABS 0.1 04/08/2013 1148   EOSABS 0.1 02/12/2013 0352   BASOSABS 0.1 04/08/2013 1148   BASOSABS 0.1 02/12/2013 0352     Chemistry      Component Value Date/Time   NA 135* 04/08/2013 1148   NA 137 02/12/2013 0352   K 4.6 04/08/2013 1148   K 3.9 02/12/2013 0352   CL 98 02/12/2013 0352   CL 91* 03/15/2012 1410   CO2 28 04/08/2013 1148   CO2 23 02/12/2013 0352   BUN 29.9* 04/08/2013 1148   BUN  19 02/12/2013 0352   CREATININE 1.2* 04/08/2013 1148   CREATININE 0.68 02/12/2013 0352      Component Value Date/Time   CALCIUM 9.6 04/08/2013 1148   CALCIUM 9.0 02/12/2013 0352   ALKPHOS 100 04/08/2013 1148   ALKPHOS 139* 02/12/2013 0352   AST 17 04/08/2013 1148   AST 15 02/12/2013 0352   ALT 18 04/08/2013 1148   ALT 25 02/12/2013 0352   BILITOT 0.34 04/08/2013 1148   BILITOT <0.2* 02/12/2013 0352       Radiological Studies: See discussion above   Impression:  #1. 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 Dramatic response to initial chemotherapy with no obvious hypermetabolic activity on serial PET scans done as recently as December 2014. Serum tumor marker CA 27.29 has remained in the normal range. Today's value pending. Plan:  Continue every 4 month surveillance.  She lives in the [facility] area and would like to transition her oncologic care to Dr. Marin Olp  #2. 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 Chronic pain syndrome secondary to #2.  #4. Narcotic analgesic habituation She appears to have improved significantly. I stopped prescribing any narcotic analgesics through this office over one year ago since her pain was not related to her cancer which is currently in active.  #5. Chronic microcytic anemia/intolerance of oral iron She has no vascular access but we have had major problems with rapid development of cellulitis every time we have attempted to put in a central line. I'm going to try one more time to put in a temporary PICC catheter and administer 2 doses of IV iron one week apart and then remove the catheter before it gets infected. We will schedule this through [facility] long interventional radiology  She states that she had a colonoscopy about 2 years ago which was reported to her as normal. I cannot find documentation of this. Normal upper endoscopy 01/13/2010 done at [facility].  #6. Status post bilateral simple mastectomies 11/19/2010.  #7. Extensive chest wall cellulitis following mastectomies in 2012.  #8. Infected Port-A-Cath removed November 2012.  #9. Status post incision and drainage of a right shoulder abscess January 2013  #10. 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  #11. Peripheral edema with no signs of congestive heart failure or valvular heart disease   CC: Patient Care Team: Rowe Clack, MD as PCP - General Ramon Dredge, MD as Consulting Physician (Orthopedic Surgery) Max Paulla Dolly, MD as Consulting Physician (Orthopedic Surgery) Annia Belt, MD as Consulting Physician (Hematology and Oncology)   Annia Belt,  MD 3/2/20156:59 PM

## 2013-04-08 NOTE — Telephone Encounter (Signed)
Called [name] Carr @ [facility] left an extensive message regarding pt appt, gave pt appt for [facility] for PICC line, a former Dr.Granfortuna pt

## 2013-04-09 ENCOUNTER — Telehealth: Payer: Self-pay | Admitting: Hematology & Oncology

## 2013-04-09 LAB — CANCER ANTIGEN 27.29: CA 27.29: 33 U/mL (ref 0–39)

## 2013-04-09 NOTE — Telephone Encounter (Signed)
Gave note to RN. Received message from [facility] pt needs to switch to [place] for month MD follow up. She needs iron 3-10 and 17th and PICC insertion

## 2013-04-10 ENCOUNTER — Other Ambulatory Visit: Payer: Self-pay | Admitting: *Deleted

## 2013-04-10 NOTE — Progress Notes (Signed)
Spoke with [employer] at [employer].   Patient will have PICC placed 3/9 and then iron infusion there @ [facility] on 3/10 and 3/17 with PICC line pulled after infusion on 3/17.  Patient will need PICC flushs between 3/10 and 3/17 - possibly schedule for 04/19/13 @ [place] [facility].  POF done.  Note added to 3/17 appt. To pull PICC.

## 2013-04-11 ENCOUNTER — Telehealth: Payer: Self-pay | Admitting: *Deleted

## 2013-04-11 ENCOUNTER — Other Ambulatory Visit: Payer: Self-pay | Admitting: Radiology

## 2013-04-11 NOTE — Telephone Encounter (Signed)
Called and informed patient that tumor marker remains in normal range. Per Dr. Beryle Beams. Patient verbalized understanding.

## 2013-04-11 NOTE — Telephone Encounter (Signed)
Message copied by Norma Fredrickson on Thu Apr 11, 2013  3:25 PM ------      Message from: Jesse Fall      Created: Thu Apr 11, 2013  3:20 PM                   ----- Message -----         From: Annia Belt, MD         Sent: 04/09/2013   9:40 AM           To: Ignacia Felling, RN, Jesse Fall, RN, #            Call pt: tumor marker remains in normal range ------

## 2013-04-12 ENCOUNTER — Other Ambulatory Visit (HOSPITAL_COMMUNITY): Payer: Self-pay

## 2013-04-15 ENCOUNTER — Ambulatory Visit (HOSPITAL_COMMUNITY)
Admission: RE | Admit: 2013-04-15 | Discharge: 2013-04-15 | Disposition: A | Discharge status: Home / Self Care | Payer: Medicare Other | Source: Ambulatory Visit | Attending: Oncology | Admitting: Oncology

## 2013-04-15 ENCOUNTER — Other Ambulatory Visit: Payer: Self-pay | Admitting: Oncology

## 2013-04-15 DIAGNOSIS — D509 Iron deficiency anemia, unspecified: Secondary | ICD-10-CM

## 2013-04-15 MED ORDER — HEPARIN SOD (PORK) LOCK FLUSH 100 UNIT/ML IV SOLN
INTRAVENOUS | Status: AC
  Filled 2013-04-15: qty 5

## 2013-04-15 NOTE — Procedures (Signed)
Successful placement of single lumen power injectable PICC line to right brachial vein. Length 42cm Tip at lower SVC/RA No complications Ready for use.  Tsosie Billing PA-C Interventional Radiology  04/15/13  2:29 PM

## 2013-04-16 ENCOUNTER — Ambulatory Visit: Payer: Self-pay

## 2013-04-17 ENCOUNTER — Ambulatory Visit (HOSPITAL_BASED_OUTPATIENT_CLINIC_OR_DEPARTMENT_OTHER): Payer: Medicare Other

## 2013-04-17 VITALS — BP 105/63 | HR 79 | Temp 98.2°F | Resp 18

## 2013-04-17 DIAGNOSIS — K909 Intestinal malabsorption, unspecified: Secondary | ICD-10-CM

## 2013-04-17 DIAGNOSIS — D638 Anemia in other chronic diseases classified elsewhere: Secondary | ICD-10-CM

## 2013-04-17 DIAGNOSIS — D508 Other iron deficiency anemias: Secondary | ICD-10-CM

## 2013-04-17 DIAGNOSIS — D509 Iron deficiency anemia, unspecified: Secondary | ICD-10-CM

## 2013-04-17 MED ORDER — FERUMOXYTOL INJECTION 510 MG/17 ML
1020.0000 mg | Freq: Once | INTRAVENOUS | Status: AC
  Administered 2013-04-17: 1020 mg via INTRAVENOUS
  Filled 2013-04-17: qty 34

## 2013-04-17 MED ORDER — ALTEPLASE 2 MG IJ SOLR
2.0000 mg | Freq: Once | INTRAMUSCULAR | Status: DC | PRN
  Filled 2013-04-17: qty 2

## 2013-04-17 MED ORDER — SODIUM CHLORIDE 0.9 % IJ SOLN
10.0000 mL | INTRAMUSCULAR | Status: DC | PRN
  Administered 2013-04-17: 10 mL
  Filled 2013-04-17: qty 10

## 2013-04-17 MED ORDER — HEPARIN SOD (PORK) LOCK FLUSH 100 UNIT/ML IV SOLN
250.0000 [IU] | Freq: Once | INTRAVENOUS | Status: AC | PRN
  Administered 2013-04-17: 250 [IU]
  Filled 2013-04-17: qty 5

## 2013-04-17 MED ORDER — SODIUM CHLORIDE 0.9 % IV SOLN
Freq: Once | INTRAVENOUS | Status: AC
  Administered 2013-04-17: 14:00:00 via INTRAVENOUS

## 2013-04-17 MED ORDER — SODIUM CHLORIDE 0.9 % IJ SOLN
3.0000 mL | Freq: Once | INTRAMUSCULAR | Status: DC | PRN
  Filled 2013-04-17: qty 10

## 2013-04-17 MED ORDER — HEPARIN SOD (PORK) LOCK FLUSH 100 UNIT/ML IV SOLN
500.0000 [IU] | Freq: Once | INTRAVENOUS | Status: DC | PRN
  Filled 2013-04-17: qty 5

## 2013-04-17 NOTE — Patient Instructions (Signed)

## 2013-04-17 NOTE — Progress Notes (Signed)
Right arm Power injectable single lumen PICC line  The catheter is ready for use. Per M.D note.

## 2013-04-19 ENCOUNTER — Telehealth: Payer: Self-pay | Admitting: Hematology & Oncology

## 2013-04-19 NOTE — Telephone Encounter (Signed)
Patient called and cx flush apt for 04/19/13 due to having a migraine headache.

## 2013-04-21 ENCOUNTER — Other Ambulatory Visit: Payer: Self-pay | Admitting: Internal Medicine

## 2013-04-23 ENCOUNTER — Ambulatory Visit: Payer: Self-pay

## 2013-04-25 ENCOUNTER — Ambulatory Visit (HOSPITAL_BASED_OUTPATIENT_CLINIC_OR_DEPARTMENT_OTHER): Payer: Medicare Other

## 2013-04-25 ENCOUNTER — Ambulatory Visit: Payer: Medicare Other

## 2013-04-25 ENCOUNTER — Other Ambulatory Visit: Payer: Medicare Other | Admitting: Lab

## 2013-04-25 VITALS — BP 124/75 | HR 80 | Temp 98.1°F | Resp 20

## 2013-04-25 DIAGNOSIS — D508 Other iron deficiency anemias: Secondary | ICD-10-CM | POA: Diagnosis present

## 2013-04-25 DIAGNOSIS — D638 Anemia in other chronic diseases classified elsewhere: Secondary | ICD-10-CM

## 2013-04-25 DIAGNOSIS — K909 Intestinal malabsorption, unspecified: Secondary | ICD-10-CM | POA: Diagnosis not present

## 2013-04-25 DIAGNOSIS — D509 Iron deficiency anemia, unspecified: Secondary | ICD-10-CM

## 2013-04-25 LAB — CMP (CANCER CENTER ONLY)
ALT(SGPT): 36 U/L (ref 10–47)
AST: 21 U/L (ref 11–38)
Albumin: 4.1 g/dL (ref 3.3–5.5)
Alkaline Phosphatase: 102 U/L — ABNORMAL HIGH (ref 26–84)
BILIRUBIN TOTAL: 0.7 mg/dL (ref 0.20–1.60)
BUN, Bld: 26 mg/dL — ABNORMAL HIGH (ref 7–22)
CO2: 32 mEq/L (ref 18–33)
Calcium: 9.9 mg/dL (ref 8.0–10.3)
Chloride: 90 mEq/L — ABNORMAL LOW (ref 98–108)
Creat: 0.9 mg/dl (ref 0.6–1.2)
Glucose, Bld: 201 mg/dL — ABNORMAL HIGH (ref 73–118)
Potassium: 3.5 mEq/L (ref 3.3–4.7)
Sodium: 135 mEq/L (ref 128–145)
Total Protein: 8.5 g/dL — ABNORMAL HIGH (ref 6.4–8.1)

## 2013-04-25 MED ORDER — SODIUM CHLORIDE 0.9 % IV SOLN
INTRAVENOUS | Status: DC
  Administered 2013-04-25: 15:00:00 via INTRAVENOUS

## 2013-04-25 MED ORDER — FERUMOXYTOL INJECTION 510 MG/17 ML
1020.0000 mg | Freq: Once | INTRAVENOUS | Status: AC
  Administered 2013-04-25: 1020 mg via INTRAVENOUS
  Filled 2013-04-25: qty 34

## 2013-04-25 NOTE — Patient Instructions (Signed)
Peripherally Inserted Central Catheter (PICC) Removal and Care After A peripherally inserted catheter (PICC) is removed when it is no longer needed, when it is clotted, or when it may be infected.  PROCEDURE  The removal of a PICC is usually painless. Removing the tape that holds the PICC in place may be the most discomfort you have.  A physicians order needs to be obtained to have the PICC removed.  A PICC can be removed in the hospital or in an outpatient setting.  Never remove or take out the PICC yourself. Only a trained clinical professional, such as a PICC nurse, should remove the PICC.  If a PICC is suspected to be infected, the PICC tip is sent to the lab for culture. HOME CARE INSTRUCTIONS  When the PICC is out, pressure is applied at the insertion site to prevent bleeding. An antibiotic ointment may be applied to the insertion site. A dry, sterile gauze is then taped over the insertion site. This dressing should stay on for 24 hours.  After the 24 hours is up, the dressing may be removed. The PICC insertion site is very small. A small scab may develop over the insertion site. It is okay to wash the site gently with soap and water. Be careful to not remove or pick the scab off. After washing, gently pat the site dry. You do not need to put another dressing over the insertion site after you wash it.  Avoid heavy, strenuous physical activity for 24 hours after the PICC is removed. This includes things like:  Weight lifting.  Strenuous yard work.  Any physical activity with repetitive arm movement. SEEK MEDICAL CARE IF:  Call or see your caregiver as soon as possible if you develop the following conditions in the arm in which the PICC was inserted:  Swelling or puffiness.  Increasing tenderness or pain. SEEK IMMEDIATE MEDICAL CARE IF:  You develop any of the following conditions in the arm that had the PICC:  Numbness or tingling in your fingers, hand, or arm.  You arm has  a bluish color and it is cold to the touch.  Redness around the insertion site or a red-streak that goes up your arm.  Any type of drainage from the PICC insertion site. This includes drainage such as:  Bleeding from the insertion site. (If this happens, apply firm, direct pressure to the PICC insertion site with a clean towel.)  Drainage that is yellow or tan in color.  You have an oral temperature above 102 F (38.9 C), not controlled by medicine. Document Released: 07/14/2009 Document Revised: 04/18/2011 Document Reviewed: 07/14/2009 [facility] Patient Information 2014 [place].  Ferumoxytol injection What is this medicine? FERUMOXYTOL is an iron complex. Iron is used to make healthy red blood cells, which carry oxygen and nutrients throughout the body. This medicine is used to treat iron deficiency anemia in people with chronic kidney disease. This medicine may be used for other purposes; ask your health care provider or pharmacist if you have questions. COMMON BRAND NAME(S): Feraheme  What should I tell my health care provider before I take this medicine? They need to know if you have any of these conditions: -anemia not caused by low iron levels -high levels of iron in the blood -magnetic resonance imaging (MRI) test scheduled -an unusual or allergic reaction to iron, other medicines, foods, dyes, or preservatives -pregnant or trying to get pregnant -breast-feeding How should I use this medicine? This medicine is for injection into a  vein. It is given by a health care professional in a hospital or clinic setting. Talk to your pediatrician regarding the use of this medicine in children. Special care may be needed. Overdosage: If you think you've taken too much of this medicine contact a poison control center or emergency room at once. Overdosage: If you think you have taken too much of this medicine contact a poison control center or emergency room at once. NOTE: This  medicine is only for you. Do not share this medicine with others. What if I miss a dose? It is important not to miss your dose. Call your doctor or health care professional if you are unable to keep an appointment. What may interact with this medicine? This medicine may interact with the following medications: -other iron products This list may not describe all possible interactions. Give your health care provider a list of all the medicines, herbs, non-prescription drugs, or dietary supplements you use. Also tell them if you smoke, drink alcohol, or use illegal drugs. Some items may interact with your medicine. What should I watch for while using this medicine? Visit your doctor or healthcare professional regularly. Tell your doctor or healthcare professional if your symptoms do not start to get better or if they get worse. You may need blood work done while you are taking this medicine. You may need to follow a special diet. Talk to your doctor. Foods that contain iron include: whole grains/cereals, dried fruits, beans, or peas, leafy green vegetables, and organ meats (liver, kidney). What side effects may I notice from receiving this medicine? Side effects that you should report to your doctor or health care professional as soon as possible: -allergic reactions like skin rash, itching or hives, swelling of the face, lips, or tongue -breathing problems -changes in blood pressure -feeling faint or lightheaded, falls -fever or chills -flushing, sweating, or hot feelings -swelling of the ankles or feet Side effects that usually do not require medical attention (Report these to your doctor or health care professional if they continue or are bothersome.): -diarrhea -headache -nausea, vomiting -stomach pain This list may not describe all possible side effects. Call your doctor for medical advice about side effects. You may report side effects to FDA at 1-800-FDA-1088. Where should I keep my  medicine? This drug is given in a hospital or clinic and will not be stored at home. NOTE: This sheet is a summary. It may not cover all possible information. If you have questions about this medicine, talk to your doctor, pharmacist, or health care provider.  2014, [employer]vier/[employer] Standard. (2011-09-09 15:23:36)

## 2013-05-20 ENCOUNTER — Other Ambulatory Visit: Payer: Self-pay | Admitting: Internal Medicine

## 2013-07-05 ENCOUNTER — Other Ambulatory Visit: Payer: Self-pay | Admitting: Internal Medicine

## 2013-07-11 ENCOUNTER — Telehealth: Payer: Self-pay | Admitting: Hematology & Oncology

## 2013-07-11 ENCOUNTER — Other Ambulatory Visit: Payer: Self-pay | Admitting: Lab

## 2013-07-11 ENCOUNTER — Ambulatory Visit: Payer: Self-pay | Admitting: Hematology & Oncology

## 2013-07-11 NOTE — Telephone Encounter (Signed)
Pt called said she couldn't make todays appointment and would call back to schedule

## 2013-07-11 NOTE — Telephone Encounter (Signed)
Pt scheduled 7-8 from 6-4

## 2013-08-13 ENCOUNTER — Telehealth: Payer: Self-pay | Admitting: Hematology & Oncology

## 2013-08-13 NOTE — Telephone Encounter (Signed)
Patient called and cx 08/14/13 apt and resch for 09/12/13

## 2013-08-14 ENCOUNTER — Other Ambulatory Visit: Payer: Self-pay | Admitting: Lab

## 2013-08-14 ENCOUNTER — Ambulatory Visit: Payer: Self-pay | Admitting: Hematology & Oncology

## 2013-08-21 ENCOUNTER — Inpatient Hospital Stay (HOSPITAL_BASED_OUTPATIENT_CLINIC_OR_DEPARTMENT_OTHER)
Admission: EM | Admit: 2013-08-21 | Discharge: 2013-08-27 | DRG: 603 | Disposition: A | Discharge status: Skilled Nursing Facility | Payer: Medicare Other | Attending: Internal Medicine | Admitting: Internal Medicine

## 2013-08-21 ENCOUNTER — Encounter (HOSPITAL_BASED_OUTPATIENT_CLINIC_OR_DEPARTMENT_OTHER): Payer: Self-pay | Admitting: Emergency Medicine

## 2013-08-21 DIAGNOSIS — IMO0002 Reserved for concepts with insufficient information to code with codable children: Principal | ICD-10-CM | POA: Diagnosis present

## 2013-08-21 DIAGNOSIS — L03119 Cellulitis of unspecified part of limb: Secondary | ICD-10-CM | POA: Diagnosis present

## 2013-08-21 DIAGNOSIS — Z8619 Personal history of other infectious and parasitic diseases: Secondary | ICD-10-CM

## 2013-08-21 DIAGNOSIS — E039 Hypothyroidism, unspecified: Secondary | ICD-10-CM | POA: Diagnosis present

## 2013-08-21 DIAGNOSIS — E2749 Other adrenocortical insufficiency: Secondary | ICD-10-CM | POA: Diagnosis present

## 2013-08-21 DIAGNOSIS — L03114 Cellulitis of left upper limb: Secondary | ICD-10-CM

## 2013-08-21 DIAGNOSIS — F609 Personality disorder, unspecified: Secondary | ICD-10-CM | POA: Diagnosis present

## 2013-08-21 DIAGNOSIS — M549 Dorsalgia, unspecified: Secondary | ICD-10-CM | POA: Diagnosis present

## 2013-08-21 DIAGNOSIS — Z888 Allergy status to other drugs, medicaments and biological substances status: Secondary | ICD-10-CM

## 2013-08-21 DIAGNOSIS — F1920 Other psychoactive substance dependence, uncomplicated: Secondary | ICD-10-CM | POA: Diagnosis present

## 2013-08-21 DIAGNOSIS — Z886 Allergy status to analgesic agent status: Secondary | ICD-10-CM

## 2013-08-21 DIAGNOSIS — E1149 Type 2 diabetes mellitus with other diabetic neurological complication: Secondary | ICD-10-CM | POA: Diagnosis present

## 2013-08-21 DIAGNOSIS — D849 Immunodeficiency, unspecified: Secondary | ICD-10-CM

## 2013-08-21 DIAGNOSIS — F3289 Other specified depressive episodes: Secondary | ICD-10-CM | POA: Diagnosis present

## 2013-08-21 DIAGNOSIS — K219 Gastro-esophageal reflux disease without esophagitis: Secondary | ICD-10-CM | POA: Diagnosis present

## 2013-08-21 DIAGNOSIS — D899 Disorder involving the immune mechanism, unspecified: Secondary | ICD-10-CM

## 2013-08-21 DIAGNOSIS — C787 Secondary malignant neoplasm of liver and intrahepatic bile duct: Secondary | ICD-10-CM | POA: Diagnosis present

## 2013-08-21 DIAGNOSIS — L408 Other psoriasis: Secondary | ICD-10-CM | POA: Diagnosis present

## 2013-08-21 DIAGNOSIS — Z91041 Radiographic dye allergy status: Secondary | ICD-10-CM

## 2013-08-21 DIAGNOSIS — Z801 Family history of malignant neoplasm of trachea, bronchus and lung: Secondary | ICD-10-CM

## 2013-08-21 DIAGNOSIS — G894 Chronic pain syndrome: Secondary | ICD-10-CM | POA: Diagnosis present

## 2013-08-21 DIAGNOSIS — L02419 Cutaneous abscess of limb, unspecified: Secondary | ICD-10-CM | POA: Diagnosis present

## 2013-08-21 DIAGNOSIS — L03116 Cellulitis of left lower limb: Secondary | ICD-10-CM | POA: Diagnosis present

## 2013-08-21 DIAGNOSIS — F329 Major depressive disorder, single episode, unspecified: Secondary | ICD-10-CM | POA: Diagnosis present

## 2013-08-21 DIAGNOSIS — Z9221 Personal history of antineoplastic chemotherapy: Secondary | ICD-10-CM

## 2013-08-21 DIAGNOSIS — M329 Systemic lupus erythematosus, unspecified: Secondary | ICD-10-CM | POA: Diagnosis present

## 2013-08-21 DIAGNOSIS — Z8614 Personal history of Methicillin resistant Staphylococcus aureus infection: Secondary | ICD-10-CM

## 2013-08-21 DIAGNOSIS — I10 Essential (primary) hypertension: Secondary | ICD-10-CM | POA: Diagnosis present

## 2013-08-21 DIAGNOSIS — E876 Hypokalemia: Secondary | ICD-10-CM | POA: Diagnosis present

## 2013-08-21 DIAGNOSIS — E1142 Type 2 diabetes mellitus with diabetic polyneuropathy: Secondary | ICD-10-CM | POA: Diagnosis present

## 2013-08-21 DIAGNOSIS — G40909 Epilepsy, unspecified, not intractable, without status epilepticus: Secondary | ICD-10-CM | POA: Diagnosis present

## 2013-08-21 DIAGNOSIS — M3500 Sicca syndrome, unspecified: Secondary | ICD-10-CM | POA: Diagnosis present

## 2013-08-21 DIAGNOSIS — D72829 Elevated white blood cell count, unspecified: Secondary | ICD-10-CM | POA: Diagnosis present

## 2013-08-21 DIAGNOSIS — C50919 Malignant neoplasm of unspecified site of unspecified female breast: Secondary | ICD-10-CM | POA: Diagnosis present

## 2013-08-21 DIAGNOSIS — M069 Rheumatoid arthritis, unspecified: Secondary | ICD-10-CM | POA: Diagnosis present

## 2013-08-21 DIAGNOSIS — Z853 Personal history of malignant neoplasm of breast: Secondary | ICD-10-CM

## 2013-08-21 DIAGNOSIS — L02416 Cutaneous abscess of left lower limb: Secondary | ICD-10-CM

## 2013-08-21 DIAGNOSIS — E119 Type 2 diabetes mellitus without complications: Secondary | ICD-10-CM

## 2013-08-21 DIAGNOSIS — E785 Hyperlipidemia, unspecified: Secondary | ICD-10-CM | POA: Diagnosis present

## 2013-08-21 LAB — BASIC METABOLIC PANEL
Anion gap: 17 — ABNORMAL HIGH (ref 5–15)
BUN: 14 mg/dL (ref 6–23)
CALCIUM: 9.6 mg/dL (ref 8.4–10.5)
CO2: 27 mEq/L (ref 19–32)
Chloride: 93 mEq/L — ABNORMAL LOW (ref 96–112)
Creatinine, Ser: 0.9 mg/dL (ref 0.50–1.10)
GFR, EST AFRICAN AMERICAN: 80 mL/min — AB (ref >90)
GFR, EST NON AFRICAN AMERICAN: 69 mL/min — AB (ref >90)
GLUCOSE: 111 mg/dL — AB (ref 70–99)
POTASSIUM: 3.4 meq/L — AB (ref 3.7–5.3)
SODIUM: 137 meq/L (ref 137–147)

## 2013-08-21 LAB — CBC
HCT: 37.2 % (ref 36.0–46.0)
Hemoglobin: 12.1 g/dL (ref 12.0–15.0)
MCH: 30 pg (ref 26.0–34.0)
MCHC: 32.5 g/dL (ref 30.0–36.0)
MCV: 92.3 fL (ref 78.0–100.0)
Platelets: 315 10*3/uL (ref 150–400)
RBC: 4.03 MIL/uL (ref 3.87–5.11)
RDW: 14.4 % (ref 11.5–15.5)
WBC: 11 10*3/uL — ABNORMAL HIGH (ref 4.0–10.5)

## 2013-08-21 LAB — CBG MONITORING, ED: GLUCOSE-CAPILLARY: 157 mg/dL — AB (ref 70–99)

## 2013-08-21 MED ORDER — HYDROMORPHONE HCL PF 1 MG/ML IJ SOLN
1.0000 mg | Freq: Once | INTRAMUSCULAR | Status: AC
  Administered 2013-08-21: 1 mg via INTRAVENOUS
  Filled 2013-08-21: qty 1

## 2013-08-21 MED ORDER — VANCOMYCIN HCL IN DEXTROSE 1-5 GM/200ML-% IV SOLN
1000.0000 mg | Freq: Once | INTRAVENOUS | Status: AC
  Administered 2013-08-21: 1000 mg via INTRAVENOUS
  Filled 2013-08-21: qty 200

## 2013-08-21 MED ORDER — HYDROMORPHONE HCL PF 1 MG/ML IJ SOLN
1.0000 mg | Freq: Once | INTRAMUSCULAR | Status: AC
  Administered 2013-08-21: 1 mg via INTRAMUSCULAR
  Filled 2013-08-21: qty 1

## 2013-08-21 NOTE — ED Notes (Signed)
Attempted US guided IV access in Right upper arm but the IV infiltrated.  Was not able to draw labs or establish IV access.

## 2013-08-21 NOTE — ED Notes (Signed)
Pt assisted to [facility] by 1 staff assist, [facility] at bs awaiting transport.

## 2013-08-21 NOTE — ED Notes (Signed)
Pt reports was sent her by PMD for DX cellulitis and needs to be admitted.

## 2013-08-21 NOTE — ED Notes (Signed)
MD at bedside. 

## 2013-08-21 NOTE — ED Provider Notes (Signed)
CSN: [id number]     Arrival date & time 08/21/13  1754 History   First MD Initiated Contact with Patient 08/21/13 1809     This chart was scribed for [name] Shipper, MD by Forrestine Him, ED Scribe. This patient was seen in room MH06/MH06 and the patient's care was started 6:30 PM.   Chief Complaint  Patient presents with  . Cellulitis   Patient is a 58 y.o. female presenting with abscess. The history is provided by the patient. No language interpreter was used.  Abscess Location:  Leg Leg abscess location:  L upper leg Size:  8 cm Abscess quality: fluctuance and painful   Abscess quality: not draining, no redness and not weeping   Red streaking: no   Progression:  Worsening Pain details:    Quality:  Dull   Severity:  Moderate   Timing:  Constant   Progression:  Worsening Chronicity:  New Context: not insect bite/sting and not skin injury   Relieved by:  Nothing Worsened by:  Nothing tried Associated symptoms: fever, nausea and vomiting     HPI Comments: [name] Carr is a 58 y.o. Female with a PMHx of Lupus, DM Type 2, DDD, and hypothyroidism who presents to the Emergency Department complaining of constant, moderate cellulitis with associated intermittent erythema x 3 months that has progressively worsened. Pt states she was given 3 shots in her L deltoid muscle around April-May. States she noted swelling the following day that progressively worsened into cellulitis. Since onset, pt states she has been in and out of the hospital for IV antibiotics. Pt was also given prescribed oral antibiotics. However, she is unable to tolerate any oral antibiotics. Pt has also noted a new similar area to the L upper extremity that has progressively worsened. States this area has been lanced previously and she was given oral antibiotics to help with healing at time of discharge. She admits to associated chills, nausea, and vomiting she attributes with both areas of cellulitis at this time. Fever  was 101.8 this morning, however, this has now resolved. Pt was evaluated by her PCP earlier today and was advised to come to ED for possible admission to hospital.  Past Medical History  Diagnosis Date  . PERSONALITY DISORDER   . Chronic pain syndrome     chronic narcotics, dependancy hx - [place] for pain mgmt  . MIGRAINE HEADACHE   . Lyme disease   . ALLERGIC RHINITIS   . ANEMIA-IRON DEFICIENCY   . HYPERLIPIDEMIA   . GERD   . DIABETES MELLITUS, TYPE II   . DEPRESSION   . SLE (systemic lupus erythematosus)     [facility] - chronic pred  . Psoriasis   . Addison's disease   . Degenerative joint disease   . DDD (degenerative disc disease)   . Phlebitis after infusion     left ?calf; "S/P phenergan/demerol injection"  . Blood clot in vein 2011; 02/04/11    "right jugular vein;left jugular vein"  . Hypothyroidism   . SEIZURE DISORDER     "lots of seizures; due to lupus"  . Anxiety   . Neuropathy     hands, feet, due to diabetes & back problem  . Surgical wound infection 02/15/2011    R shoulder and mastectomy site - related to PICC  . CARCINOMA, BREAST 08/2009 dx    R breast,  mets to liver - resolution s/p chemo, s/p B mastect  . Metastases to the liver   .  RA (rheumatoid arthritis)    Past Surgical History  Procedure Laterality Date  . Liver bopsy  09/2009  . Port a cath placement  09/2009; 11/19/10  . Port-a-cath removal  04/2010    "due to staph & strept"  . Port-a-cath removal  12/23/2010    Procedure: REMOVAL PORT-A-CATH;  Surgeon: Pedro Earls, MD;  Location: [facility];  Service: General;  Laterality: Left;  . Breast surgery  09/2009    right breast biopsy  . Mastectomy  11/19/10    bilateral mastectomy   . I&d extremity  ~ 2010    right hand; S/P "dog bite"  . Peripherally inserted central catheter insertion    . Spider bite     Family History  Problem Relation Age of Onset  . Lung cancer Mother   . Lung cancer Father   . Arthritis Other   .  Diabetes Other   . Hyperlipidemia Other    History  Substance Use Topics  . Smoking status: Never Smoker   . Smokeless tobacco: Never Used  . Alcohol Use: No   OB History   Grav Para Term Preterm Abortions TAB SAB Ect Mult Living                 Review of Systems  Constitutional: Positive for fever and chills.  Gastrointestinal: Positive for nausea and vomiting. Negative for diarrhea.  Skin: Positive for wound (cellulitus to L upper extremity and L lower extremity).  All other systems reviewed and are negative.     Allergies  Ammonia; Contrast media; Iohexol; Methotrexate; Midazolam hcl; Nalbuphine; Infliximab; Ketorolac tromethamine; Nsaids; and Celecoxib  Home Medications   Prior to Admission medications   Medication Sig Start Date End Date Taking? Authorizing Provider  acetaminophen (TYLENOL) 325 MG tablet Take 325-650 mg by mouth every 6 (six) hours as needed.    Historical Provider, MD  bisacodyl (BISACODYL) 5 MG EC tablet Take 5 mg by mouth daily as needed for moderate constipation.    Historical Provider, MD  clonazePAM (KLONOPIN) 1 MG tablet Take 1 tablet (1 mg total) by mouth 2 (two) times daily as needed for anxiety. 10/18/12   Rowe Clack, MD  docusate sodium (COLACE) 100 MG capsule Take 200 mg by mouth 2 (two) times daily.     Historical Provider, MD  DULoxetine (CYMBALTA) 60 MG capsule Take 1 capsule (60 mg total) by mouth 2 (two) times daily. 06/11/12   Rowe Clack, MD  furosemide (LASIX) 20 MG tablet Take 40 mg by mouth daily.     Historical Provider, MD  gabapentin (NEURONTIN) 300 MG capsule Take 3 capsules (900 mg total) by mouth 3 (three) times daily. 04/25/12   Rowe Clack, MD  hydrochlorothiazide (HYDRODIURIL) 25 MG tablet Take 25 mg by mouth daily.    Historical Provider, MD  HYDROcodone-acetaminophen (NORCO/VICODIN) 5-325 MG per tablet Take 1 tablet by mouth every 6 (six) hours as needed. 02/12/13   Merryl Hacker, MD  Lactobacillus  (ACIDOPHILUS PO) Take 3 capsules by mouth 3 (three) times daily.    Historical Provider, MD  OLANZapine (ZYPREXA) 5 MG tablet Take 5 mg by mouth 3 (three) times daily.    Historical Provider, MD  pantoprazole (PROTONIX) 40 MG tablet Take 40 mg by mouth daily.    Historical Provider, MD  potassium chloride SA (K-DUR,KLOR-CON) 20 MEQ tablet Take 40 mEq by mouth daily.     Historical Provider, MD  predniSONE (DELTASONE) 10 MG tablet Take 10 mg  by mouth daily with breakfast.    Historical Provider, MD  rOPINIRole (REQUIP) 1 MG tablet Take 1 mg by mouth at bedtime.    Historical Provider, MD  senna-docusate (SENOKOT-S) 8.6-50 MG per tablet Take 1 tablet by mouth 2 (two) times daily as needed.     Historical Provider, MD  spironolactone (ALDACTONE) 25 MG tablet Take 25 mg by mouth 2 (two) times daily.    Historical Provider, MD  spironolactone-hydrochlorothiazide (ALDACTAZIDE) 25-25 MG per tablet Take 1 tablet by mouth 2 (two) times daily.    Historical Provider, MD   Triage Vitals: BP 146/68  Pulse 92  Temp(Src) 98.2 F (36.8 C)  Resp 16  Ht 5\' 4"  (1.626 m)  Wt 188 lb (85.276 kg)  BMI 32.25 kg/m2  SpO2 98%    Physical Exam  Nursing note and vitals reviewed. Constitutional: She is oriented to person, place, and time. She appears well-developed and well-nourished. No distress.  HENT:  Head: Normocephalic and atraumatic.  Eyes: EOM are normal.  Neck: Normal range of motion.  Cardiovascular: Normal rate, regular rhythm and normal heart sounds.   Pulmonary/Chest: Effort normal and breath sounds normal.  Abdominal: Soft. She exhibits no distension. There is no tenderness.  Musculoskeletal: Normal range of motion.  Neurological: She is alert and oriented to person, place, and time.  Skin: Skin is warm and dry.  Large 8 cm abscess on L mid lateral thigh. No erythema. Positive fluctuance. L lateral shoulder with abrasion roughly 6 cm in diameter. Purulent base. No surrounding cellulitis.   Psychiatric: She has a normal mood and affect. Judgment normal.    ED Course  INCISION AND DRAINAGE Date/Time: 08/21/2013 8:09 PM Performed by: [name] Carr Authorized by: [name] Carr Consent: Verbal consent obtained. Type: abscess Body area: lower extremity Location details: left leg Anesthesia: local infiltration Local anesthetic: lidocaine 1% with epinephrine Anesthetic total: 20 ml Patient sedated: no Scalpel size: 11 Incision type: single straight Complexity: simple Drainage: purulent Drainage amount: copious Wound treatment: drain placed Packing material: 1/2 in iodoform gauze Patient tolerance: Patient tolerated the procedure well with no immediate complications. Comments: Packed abscess. Open prior incision, packed again as still had some induration around it.   (including critical care time)  DIAGNOSTIC STUDIES: Oxygen Saturation is 98% on RA, Normal by my interpretation.    COORDINATION OF CARE: 6:16 PM- Will order CBC and BMP. Will perform I&D. Discussed treatment plan with pt at bedside and pt agreed to plan.    Labs Review Labs Reviewed  CBC - Abnormal; Notable for the following:    WBC 11.0 (*)    All other components within normal limits  BASIC METABOLIC PANEL - Abnormal; Notable for the following:    Potassium 3.4 (*)    Chloride 93 (*)    Glucose, Bld 111 (*)    GFR calc non Af Amer 69 (*)    GFR calc Af Amer 80 (*)    Anion gap 17 (*)    All other components within normal limits    Imaging Review No results found.   EKG Interpretation None     Angiocath insertion Performed by: [name] Carr  Consent: Verbal consent obtained. Risks and benefits: risks, benefits and alternatives were discussed Time out: Immediately prior to procedure a "time out" was called to verify the correct patient, procedure, equipment, support staff and site/side marked as required.  Preparation: Patient was prepped and draped in the  usual sterile fashion.  Vein Location: R  AC  Yes Ultrasound Guided  Gauge: 20  Normal blood return and flush without difficulty Patient tolerance: Patient tolerated the procedure well with no immediate complications.     MDM   Final diagnoses:  Cellulitis of left lower extremity    58 year old female presents for a left leg abscess. She's been recently admitted to [facility] where she received IV antibiotics. Had I&D done of the left leg abscess in the emergency department. She reports fever and inability to tolerate outpatient antibiotics. She states due to her lupus she cannot take oral antibiotics. She has had trouble with these antibiotics, throwing them up. Getting IM rocephin at her PCP's office. Febrile at home at 101.8. No fever here. Left leg shows large fluctuant abscess without overlying erythema or cellulitis. Bedside ultrasound done shows pockets of fluid. We'll I&D at the bedside. Left shoulder pain also present. She states it is an abscess also. Left shoulder has an abrasion with purulent base without surrounding cellulitis erythema. It is not consistent with an abscess, more infected abrasion. Labs with mild white count. Since sent from physician's office, fever this morning, inability to take PO antibiotics, continued pain and swelling despite IM rocephin at her PCP's office. admitted to medicine for IV antibiotics. Vanc given here. IV started by me. Admitted by Dr. Ernestina Patches.  I personally performed the services described in this documentation, which was scribed in my presence. The recorded information has been reviewed and is accurate.    [name] Shipper, MD 08/22/13 Dyann Kief

## 2013-08-21 NOTE — Evaluation (Signed)
Received call from [facility] about pt with cellulitis LLE. Noted PMHx of Lupus, DM Type 2, DDD, and hypothyroidism.  Was admitted to [facility] for this recently. Discharged a few days ago. Unable to tolerate oral abx. EDP u/s area. Drained. Started on vanc. Febrile to around 101 today. WBC 11. Admit to medsurg bed.

## 2013-08-21 NOTE — ED Notes (Signed)
Pt report given to [facility] 

## 2013-08-21 NOTE — ED Notes (Signed)
MD at bedside, I&D performed to left thigh area. Pt tolerated well.

## 2013-08-22 ENCOUNTER — Inpatient Hospital Stay (HOSPITAL_COMMUNITY): Payer: Medicare Other

## 2013-08-22 ENCOUNTER — Encounter (HOSPITAL_COMMUNITY): Payer: Self-pay | Admitting: *Deleted

## 2013-08-22 DIAGNOSIS — R509 Fever, unspecified: Secondary | ICD-10-CM | POA: Diagnosis not present

## 2013-08-22 DIAGNOSIS — Z9221 Personal history of antineoplastic chemotherapy: Secondary | ICD-10-CM | POA: Diagnosis not present

## 2013-08-22 DIAGNOSIS — L03116 Cellulitis of left lower limb: Secondary | ICD-10-CM | POA: Diagnosis present

## 2013-08-22 DIAGNOSIS — L03119 Cellulitis of unspecified part of limb: Secondary | ICD-10-CM | POA: Diagnosis present

## 2013-08-22 DIAGNOSIS — Z886 Allergy status to analgesic agent status: Secondary | ICD-10-CM | POA: Diagnosis not present

## 2013-08-22 DIAGNOSIS — F329 Major depressive disorder, single episode, unspecified: Secondary | ICD-10-CM | POA: Diagnosis present

## 2013-08-22 DIAGNOSIS — C787 Secondary malignant neoplasm of liver and intrahepatic bile duct: Secondary | ICD-10-CM | POA: Diagnosis present

## 2013-08-22 DIAGNOSIS — IMO0002 Reserved for concepts with insufficient information to code with codable children: Secondary | ICD-10-CM | POA: Diagnosis present

## 2013-08-22 DIAGNOSIS — F609 Personality disorder, unspecified: Secondary | ICD-10-CM | POA: Diagnosis present

## 2013-08-22 DIAGNOSIS — M329 Systemic lupus erythematosus, unspecified: Secondary | ICD-10-CM | POA: Diagnosis present

## 2013-08-22 DIAGNOSIS — L408 Other psoriasis: Secondary | ICD-10-CM | POA: Diagnosis present

## 2013-08-22 DIAGNOSIS — M549 Dorsalgia, unspecified: Secondary | ICD-10-CM | POA: Diagnosis present

## 2013-08-22 DIAGNOSIS — Z853 Personal history of malignant neoplasm of breast: Secondary | ICD-10-CM | POA: Diagnosis not present

## 2013-08-22 DIAGNOSIS — L089 Local infection of the skin and subcutaneous tissue, unspecified: Secondary | ICD-10-CM | POA: Diagnosis not present

## 2013-08-22 DIAGNOSIS — E785 Hyperlipidemia, unspecified: Secondary | ICD-10-CM | POA: Diagnosis present

## 2013-08-22 DIAGNOSIS — G40909 Epilepsy, unspecified, not intractable, without status epilepticus: Secondary | ICD-10-CM | POA: Diagnosis present

## 2013-08-22 DIAGNOSIS — I10 Essential (primary) hypertension: Secondary | ICD-10-CM | POA: Diagnosis present

## 2013-08-22 DIAGNOSIS — M3500 Sicca syndrome, unspecified: Secondary | ICD-10-CM | POA: Diagnosis present

## 2013-08-22 DIAGNOSIS — F1920 Other psychoactive substance dependence, uncomplicated: Secondary | ICD-10-CM | POA: Diagnosis present

## 2013-08-22 DIAGNOSIS — Z8619 Personal history of other infectious and parasitic diseases: Secondary | ICD-10-CM | POA: Diagnosis not present

## 2013-08-22 DIAGNOSIS — E2749 Other adrenocortical insufficiency: Secondary | ICD-10-CM | POA: Diagnosis present

## 2013-08-22 DIAGNOSIS — E1142 Type 2 diabetes mellitus with diabetic polyneuropathy: Secondary | ICD-10-CM | POA: Diagnosis present

## 2013-08-22 DIAGNOSIS — K219 Gastro-esophageal reflux disease without esophagitis: Secondary | ICD-10-CM | POA: Diagnosis present

## 2013-08-22 DIAGNOSIS — L02419 Cutaneous abscess of limb, unspecified: Secondary | ICD-10-CM | POA: Diagnosis present

## 2013-08-22 DIAGNOSIS — D72829 Elevated white blood cell count, unspecified: Secondary | ICD-10-CM | POA: Diagnosis not present

## 2013-08-22 DIAGNOSIS — E1149 Type 2 diabetes mellitus with other diabetic neurological complication: Secondary | ICD-10-CM | POA: Diagnosis present

## 2013-08-22 DIAGNOSIS — Z801 Family history of malignant neoplasm of trachea, bronchus and lung: Secondary | ICD-10-CM | POA: Diagnosis not present

## 2013-08-22 DIAGNOSIS — M069 Rheumatoid arthritis, unspecified: Secondary | ICD-10-CM | POA: Diagnosis present

## 2013-08-22 DIAGNOSIS — Z8614 Personal history of Methicillin resistant Staphylococcus aureus infection: Secondary | ICD-10-CM | POA: Diagnosis not present

## 2013-08-22 DIAGNOSIS — E876 Hypokalemia: Secondary | ICD-10-CM | POA: Diagnosis present

## 2013-08-22 DIAGNOSIS — G894 Chronic pain syndrome: Secondary | ICD-10-CM | POA: Diagnosis present

## 2013-08-22 DIAGNOSIS — Z91041 Radiographic dye allergy status: Secondary | ICD-10-CM | POA: Diagnosis not present

## 2013-08-22 DIAGNOSIS — E039 Hypothyroidism, unspecified: Secondary | ICD-10-CM | POA: Diagnosis present

## 2013-08-22 DIAGNOSIS — Z888 Allergy status to other drugs, medicaments and biological substances status: Secondary | ICD-10-CM | POA: Diagnosis not present

## 2013-08-22 DIAGNOSIS — F3289 Other specified depressive episodes: Secondary | ICD-10-CM | POA: Diagnosis present

## 2013-08-22 LAB — CBC
HCT: 35.2 % — ABNORMAL LOW (ref 36.0–46.0)
HEMOGLOBIN: 11.2 g/dL — AB (ref 12.0–15.0)
MCH: 29.2 pg (ref 26.0–34.0)
MCHC: 31.8 g/dL (ref 30.0–36.0)
MCV: 91.9 fL (ref 78.0–100.0)
Platelets: 280 10*3/uL (ref 150–400)
RBC: 3.83 MIL/uL — ABNORMAL LOW (ref 3.87–5.11)
RDW: 14.5 % (ref 11.5–15.5)
WBC: 7.9 10*3/uL (ref 4.0–10.5)

## 2013-08-22 LAB — BASIC METABOLIC PANEL
Anion gap: 16 — ABNORMAL HIGH (ref 5–15)
BUN: 17 mg/dL (ref 6–23)
CHLORIDE: 95 meq/L — AB (ref 96–112)
CO2: 28 mEq/L (ref 19–32)
Calcium: 8.9 mg/dL (ref 8.4–10.5)
Creatinine, Ser: 0.79 mg/dL (ref 0.50–1.10)
GFR calc non Af Amer: >90 mL/min (ref >90)
Glucose, Bld: 163 mg/dL — ABNORMAL HIGH (ref 70–99)
Potassium: 3.2 mEq/L — ABNORMAL LOW (ref 3.7–5.3)
Sodium: 139 mEq/L (ref 137–147)

## 2013-08-22 LAB — GLUCOSE, CAPILLARY
GLUCOSE-CAPILLARY: 111 mg/dL — AB (ref 70–99)
GLUCOSE-CAPILLARY: 129 mg/dL — AB (ref 70–99)
Glucose-Capillary: 116 mg/dL — ABNORMAL HIGH (ref 70–99)
Glucose-Capillary: 135 mg/dL — ABNORMAL HIGH (ref 70–99)

## 2013-08-22 LAB — MRSA PCR SCREENING: MRSA by PCR: POSITIVE — AB

## 2013-08-22 MED ORDER — BACID PO TABS
1.0000 | ORAL_TABLET | Freq: Every day | ORAL | Status: DC
  Administered 2013-08-23 – 2013-08-27 (×5): 1 via ORAL
  Filled 2013-08-22 (×5): qty 1

## 2013-08-22 MED ORDER — ACETAMINOPHEN 500 MG PO TABS: 1000.0000 mg | ORAL_TABLET | Freq: Four times a day (QID) | ORAL | Status: DC | PRN

## 2013-08-22 MED ORDER — SODIUM CHLORIDE 0.9 % IJ SOLN
3.0000 mL | INTRAMUSCULAR | Status: DC | PRN
  Administered 2013-08-25: 3 mL via INTRAVENOUS

## 2013-08-22 MED ORDER — PANTOPRAZOLE SODIUM 40 MG PO TBEC
40.0000 mg | DELAYED_RELEASE_TABLET | Freq: Every day | ORAL | Status: DC
  Administered 2013-08-22 – 2013-08-27 (×6): 40 mg via ORAL
  Filled 2013-08-22 (×6): qty 1

## 2013-08-22 MED ORDER — PREDNISONE 5 MG PO TABS
7.5000 mg | ORAL_TABLET | Freq: Every day | ORAL | Status: DC
  Filled 2013-08-22: qty 1

## 2013-08-22 MED ORDER — ACETAMINOPHEN 650 MG RE SUPP: 650.0000 mg | Freq: Four times a day (QID) | RECTAL | Status: DC | PRN

## 2013-08-22 MED ORDER — HYDROMORPHONE HCL PF 1 MG/ML IJ SOLN
0.5000 mg | INTRAMUSCULAR | Status: DC | PRN
  Administered 2013-08-22 – 2013-08-23 (×7): 1 mg via INTRAVENOUS
  Filled 2013-08-22 (×7): qty 1

## 2013-08-22 MED ORDER — CLONAZEPAM 1 MG PO TABS: 1.0000 mg | ORAL_TABLET | Freq: Two times a day (BID) | ORAL | Status: DC

## 2013-08-22 MED ORDER — SODIUM CHLORIDE 0.9 % IJ SOLN
3.0000 mL | Freq: Two times a day (BID) | INTRAMUSCULAR | Status: DC
  Administered 2013-08-25 – 2013-08-27 (×2): 3 mL via INTRAVENOUS

## 2013-08-22 MED ORDER — DULOXETINE HCL 60 MG PO CPEP
60.0000 mg | ORAL_CAPSULE | Freq: Two times a day (BID) | ORAL | Status: DC
  Filled 2013-08-22: qty 1

## 2013-08-22 MED ORDER — VANCOMYCIN HCL 10 G IV SOLR
1250.0000 mg | Freq: Two times a day (BID) | INTRAVENOUS | Status: DC
  Administered 2013-08-22 – 2013-08-24 (×5): 1250 mg via INTRAVENOUS
  Filled 2013-08-22 (×8): qty 1250

## 2013-08-22 MED ORDER — SODIUM CHLORIDE 0.9 % IJ SOLN
10.0000 mL | INTRAMUSCULAR | Status: DC | PRN
Start: 2013-08-22 — End: 2013-08-27
  Administered 2013-08-22 – 2013-08-27 (×5): 10 mL

## 2013-08-22 MED ORDER — MUPIROCIN 2 % EX OINT
1.0000 "application " | TOPICAL_OINTMENT | Freq: Two times a day (BID) | CUTANEOUS | Status: AC
  Administered 2013-08-22 – 2013-08-26 (×10): 1 via NASAL
  Filled 2013-08-22: qty 22

## 2013-08-22 MED ORDER — HYDROCHLOROTHIAZIDE 25 MG PO TABS
25.0000 mg | ORAL_TABLET | Freq: Every day | ORAL | Status: DC | PRN
  Filled 2013-08-22 (×2): qty 1

## 2013-08-22 MED ORDER — FUROSEMIDE 20 MG PO TABS
20.0000 mg | ORAL_TABLET | Freq: Every day | ORAL | Status: DC
  Administered 2013-08-22 – 2013-08-27 (×6): 20 mg via ORAL
  Filled 2013-08-22 (×6): qty 1

## 2013-08-22 MED ORDER — DOCUSATE SODIUM 100 MG PO CAPS
200.0000 mg | ORAL_CAPSULE | Freq: Two times a day (BID) | ORAL | Status: DC
  Administered 2013-08-23 – 2013-08-27 (×9): 200 mg via ORAL
  Filled 2013-08-22 (×13): qty 2

## 2013-08-22 MED ORDER — ROPINIROLE HCL 1 MG PO TABS
1.0000 mg | ORAL_TABLET | Freq: Every day | ORAL | Status: DC
  Administered 2013-08-22 – 2013-08-26 (×5): 1 mg via ORAL
  Filled 2013-08-22 (×6): qty 1

## 2013-08-22 MED ORDER — INSULIN ASPART 100 UNIT/ML ~~LOC~~ SOLN
0.0000 [IU] | Freq: Three times a day (TID) | SUBCUTANEOUS | Status: DC
  Administered 2013-08-22 – 2013-08-25 (×5): 1 [IU] via SUBCUTANEOUS

## 2013-08-22 MED ORDER — INSULIN ASPART 100 UNIT/ML ~~LOC~~ SOLN
0.0000 [IU] | SUBCUTANEOUS | Status: DC
  Administered 2013-08-22: 1 [IU] via SUBCUTANEOUS

## 2013-08-22 MED ORDER — PREDNISONE 5 MG PO TABS
7.5000 mg | ORAL_TABLET | Freq: Every day | ORAL | Status: DC
  Administered 2013-08-22 – 2013-08-26 (×5): 7.5 mg via ORAL
  Filled 2013-08-22 (×8): qty 1

## 2013-08-22 MED ORDER — OXYCODONE HCL 5 MG PO TABS: 5.0000 mg | ORAL_TABLET | ORAL | Status: DC | PRN

## 2013-08-22 MED ORDER — HYDROCODONE-ACETAMINOPHEN 10-325 MG PO TABS
1.0000 | ORAL_TABLET | Freq: Four times a day (QID) | ORAL | Status: DC | PRN
  Administered 2013-08-22 – 2013-08-23 (×3): 1 via ORAL
  Filled 2013-08-22 (×3): qty 1

## 2013-08-22 MED ORDER — ACETAMINOPHEN 325 MG PO TABS: 650.0000 mg | ORAL_TABLET | Freq: Four times a day (QID) | ORAL | Status: DC | PRN

## 2013-08-22 MED ORDER — GABAPENTIN 300 MG PO CAPS
900.0000 mg | ORAL_CAPSULE | Freq: Three times a day (TID) | ORAL | Status: DC
  Administered 2013-08-22 – 2013-08-27 (×16): 900 mg via ORAL
  Filled 2013-08-22 (×17): qty 3

## 2013-08-22 MED ORDER — SODIUM CHLORIDE 0.9 % IV SOLN
250.0000 mL | INTRAVENOUS | Status: DC | PRN
  Administered 2013-08-22: 250 mL via INTRAVENOUS
  Administered 2013-08-25: 1000 mL via INTRAVENOUS

## 2013-08-22 MED ORDER — TRAZODONE HCL 100 MG PO TABS
100.0000 mg | ORAL_TABLET | Freq: Every evening | ORAL | Status: DC | PRN
  Administered 2013-08-24: 100 mg via ORAL
  Filled 2013-08-22 (×2): qty 1

## 2013-08-22 MED ORDER — CHLORHEXIDINE GLUCONATE CLOTH 2 % EX PADS
6.0000 | MEDICATED_PAD | Freq: Every day | CUTANEOUS | Status: AC
  Administered 2013-08-22 – 2013-08-26 (×4): 6 via TOPICAL

## 2013-08-22 MED ORDER — SENNOSIDES-DOCUSATE SODIUM 8.6-50 MG PO TABS: 1.0000 | ORAL_TABLET | Freq: Two times a day (BID) | ORAL | Status: DC | PRN

## 2013-08-22 MED ORDER — HYDROCHLOROTHIAZIDE 25 MG PO TABS: 25.0000 mg | ORAL_TABLET | Freq: Every day | ORAL | Status: DC

## 2013-08-22 MED ORDER — CEFAZOLIN SODIUM 1-5 GM-% IV SOLN
1.0000 g | Freq: Three times a day (TID) | INTRAVENOUS | Status: DC
  Administered 2013-08-22 – 2013-08-24 (×7): 1 g via INTRAVENOUS
  Filled 2013-08-22 (×9): qty 50

## 2013-08-22 MED ORDER — ALUM & MAG HYDROXIDE-SIMETH 200-200-20 MG/5ML PO SUSP
30.0000 mL | Freq: Four times a day (QID) | ORAL | Status: DC | PRN
  Administered 2013-08-22 – 2013-08-25 (×2): 30 mL via ORAL
  Filled 2013-08-22 (×3): qty 30

## 2013-08-22 MED ORDER — ONDANSETRON HCL 4 MG PO TABS: 4.0000 mg | ORAL_TABLET | Freq: Four times a day (QID) | ORAL | Status: DC | PRN

## 2013-08-22 MED ORDER — ONDANSETRON HCL 4 MG/2ML IJ SOLN
4.0000 mg | Freq: Four times a day (QID) | INTRAMUSCULAR | Status: DC | PRN
  Administered 2013-08-22 – 2013-08-26 (×6): 4 mg via INTRAVENOUS
  Filled 2013-08-22 (×7): qty 2

## 2013-08-22 MED ORDER — ENOXAPARIN SODIUM 40 MG/0.4ML ~~LOC~~ SOLN
40.0000 mg | Freq: Every day | SUBCUTANEOUS | Status: DC
  Administered 2013-08-22 – 2013-08-27 (×6): 40 mg via SUBCUTANEOUS
  Filled 2013-08-22 (×6): qty 0.4

## 2013-08-22 MED ORDER — PANTOPRAZOLE SODIUM 40 MG PO TBEC: 40.0000 mg | DELAYED_RELEASE_TABLET | Freq: Every day | ORAL | Status: DC

## 2013-08-22 MED ORDER — HYDROMORPHONE HCL PF 1 MG/ML IJ SOLN
1.0000 mg | Freq: Once | INTRAMUSCULAR | Status: AC
  Administered 2013-08-22: 1 mg via INTRAMUSCULAR
  Filled 2013-08-22: qty 1

## 2013-08-22 MED ORDER — CLONAZEPAM 1 MG PO TABS
1.0000 mg | ORAL_TABLET | Freq: Two times a day (BID) | ORAL | Status: DC
  Administered 2013-08-22 – 2013-08-27 (×10): 1 mg via ORAL
  Filled 2013-08-22 (×11): qty 1

## 2013-08-22 MED ORDER — POTASSIUM CHLORIDE CRYS ER 20 MEQ PO TBCR
20.0000 meq | EXTENDED_RELEASE_TABLET | Freq: Two times a day (BID) | ORAL | Status: DC
  Administered 2013-08-22 – 2013-08-27 (×10): 20 meq via ORAL
  Filled 2013-08-22 (×15): qty 1

## 2013-08-22 MED ORDER — DULOXETINE HCL 60 MG PO CPEP
60.0000 mg | ORAL_CAPSULE | Freq: Two times a day (BID) | ORAL | Status: DC
  Administered 2013-08-22 – 2013-08-27 (×10): 60 mg via ORAL
  Filled 2013-08-22 (×11): qty 1

## 2013-08-22 MED ORDER — BISACODYL 5 MG PO TBEC: 5.0000 mg | DELAYED_RELEASE_TABLET | Freq: Every day | ORAL | Status: DC | PRN

## 2013-08-22 NOTE — Progress Notes (Signed)
Utilization review completed. Darienne Belleau, RN, BSN. 

## 2013-08-22 NOTE — Progress Notes (Signed)
ANTIBIOTIC CONSULT NOTE - INITIAL  Pharmacy Consult for Vancomycin Indication: cellulitis  Allergies  Allergen Reactions  . Ammonia Other (See Comments)    Ended up on ventilator from ammonia tabs  . Contrast Media [Iodinated Diagnostic Agents] Other (See Comments)    Doesn't breath well.  . Iohexol Other (See Comments)    SOB   . Methotrexate Anaphylaxis  . Midazolam Hcl Anaphylaxis  . Nalbuphine Other (See Comments)    Can't breathe well  . Infliximab Nausea And Vomiting  . Ketorolac Tromethamine Other (See Comments)    Injectable doesn't work and pill hurts stomach.  . Nsaids Nausea And Vomiting  . Celecoxib Rash    Patient Measurements: Height: 5\' 4"  (162.6 cm) Weight: 186 lb 15.2 oz (84.8 kg) IBW/kg (Calculated) : 54.7  Vital Signs: Temp: 99 F (37.2 C) (07/16 0038) Temp src: Oral (07/16 0038) BP: 139/67 mmHg (07/16 0038) Pulse Rate: 86 (07/16 0038) Intake/Output from previous day:   Intake/Output from this shift:    Labs:  Recent Labs  08/21/13 1957  WBC 11.0*  HGB 12.1  PLT 315  CREATININE 0.90   Estimated Creatinine Clearance: 71.7 ml/min (by C-G formula based on Cr of 0.9). No results found for this basename: VANCOTROUGH, Corlis Leak, VANCORANDOM, [place], GENTPEAK, GENTRANDOM, TOBRATROUGH, TOBRAPEAK, TOBRARND, AMIKACINPEAK, AMIKACINTROU, AMIKACIN,  in the last 72 hours   Microbiology: Recent Results (from the past 720 hour(s))  MRSA PCR SCREENING     Status: Abnormal   Collection Time    08/22/13  2:36 AM      Result Value Ref Range Status   MRSA by PCR POSITIVE (*) NEGATIVE Final   Comment:            The GeneXpert MRSA Assay (FDA     approved for NASAL specimens     only), is one component of a     comprehensive MRSA colonization     surveillance program. It is not     intended to diagnose MRSA     infection nor to guide or     monitor treatment for     MRSA infections.     RESULT CALLED TO, READ BACK BY AND VERIFIED WITH:   BIRAGO,M RN [id number] AT 0421 SKEEN,P    Medical History: Past Medical History  Diagnosis Date  . PERSONALITY DISORDER   . Chronic pain syndrome     chronic narcotics, dependancy hx - d[place] for pain mgmt  . MIGRAINE HEADACHE   . Lyme disease   . ALLERGIC RHINITIS   . ANEMIA-IRON DEFICIENCY   . HYPERLIPIDEMIA   . GERD   . DIABETES MELLITUS, TYPE II   . DEPRESSION   . SLE (systemic lupus erythematosus)     rheum [place] (m[place]ra) - chronic pred  . Psoriasis   . Addison's disease   . Degenerative joint disease   . DDD (degenerative disc disease)   . Phlebitis after infusion     left ?calf; "S/P phenergan/demerol injection"  . Blood clot in vein 2011; 02/04/11    "right jugular vein;left jugular vein"  . SEIZURE DISORDER     "lots of seizures; due to lupus"  . Anxiety   . Neuropathy     hands, feet, due to diabetes & back problem  . Surgical wound infection 02/15/2011    R shoulder and mastectomy site - related to PICC  . CARCINOMA, BREAST 08/2009 dx    R breast,  mets to liver - resolution s/p chemo, s/p B mastect  .  Metastases to the liver   . RA (rheumatoid arthritis)   . Hypothyroidism pt states she has never had hy pothyroidism    Medications:  Prescriptions prior to admission  Medication Sig Dispense Refill  . acetaminophen (TYLENOL) 325 MG tablet Take 325-650 mg by mouth every 6 (six) hours as needed.      . bisacodyl (BISACODYL) 5 MG EC tablet Take 5 mg by mouth daily as needed for moderate constipation.      . clonazePAM (KLONOPIN) 1 MG tablet Take 1 tablet (1 mg total) by mouth 2 (two) times daily as needed for anxiety.  30 tablet  2  . docusate sodium (COLACE) 100 MG capsule Take 200 mg by mouth 2 (two) times daily.       . DULoxetine (CYMBALTA) 60 MG capsule Take 1 capsule (60 mg total) by mouth 2 (two) times daily.  180 capsule  1  . furosemide (LASIX) 20 MG tablet Take 40 mg by mouth daily.       Marland Kitchen gabapentin (NEURONTIN) 300 MG capsule Take 3 capsules (900 mg  total) by mouth 3 (three) times daily.  810 capsule  0  . hydrochlorothiazide (HYDRODIURIL) 25 MG tablet Take 25 mg by mouth daily.      Marland Kitchen HYDROcodone-acetaminophen (NORCO/VICODIN) 5-325 MG per tablet Take 1 tablet by mouth every 6 (six) hours as needed.  10 tablet  0  . Lactobacillus (ACIDOPHILUS PO) Take 3 capsules by mouth 3 (three) times daily.      Marland Kitchen OLANZapine (ZYPREXA) 5 MG tablet Take 5 mg by mouth 3 (three) times daily.      . pantoprazole (PROTONIX) 40 MG tablet Take 40 mg by mouth daily.      . potassium chloride SA (K-DUR,KLOR-CON) 20 MEQ tablet Take 40 mEq by mouth daily.       . predniSONE (DELTASONE) 10 MG tablet Take 10 mg by mouth daily with breakfast.      . rOPINIRole (REQUIP) 1 MG tablet Take 1 mg by mouth at bedtime.      . senna-docusate (SENOKOT-S) 8.6-50 MG per tablet Take 1 tablet by mouth 2 (two) times daily as needed.       Marland Kitchen spironolactone (ALDACTONE) 25 MG tablet Take 25 mg by mouth 2 (two) times daily.      Marland Kitchen spironolactone-hydrochlorothiazide (ALDACTAZIDE) 25-25 MG per tablet Take 1 tablet by mouth 2 (two) times daily.       Assessment: 58 yo female with LLE cellulitis/abcess for empiric antibiotics.  Vancomycin 1 g IV given in ED at  2245  Goal of Therapy:  Vancomycin trough level 10-15 mcg/ml  Plan:  Vancomycin 1250 mg IV q12h  Caryl Pina 08/22/2013,5:01 AM

## 2013-08-22 NOTE — H&P (Signed)
[facility] Admission History and Physical       [name] Carr IFO:[id number] DOB: 1955/04/03 DOA: 08/21/2013  Referring physician:  EDP PCP: [name], MD  Specialists:   Chief Complaint: Increasing Redness and Swelling of left leg.     HPI: [name] Carr is a 58 y.o. female with a history of prednisone dependent SLE, and Multiple Medical Problems along with a history of recurrent MRSA Cellulitis who presented to the [facility] ED with complaints of fever today to 101, along with progressive redness and swelling of her left thigh over the past 4 days.  She reports that she has had cellulitis on her left shoulder which is still infected despite treatment with antibiotics off and on for the past 3 months.  She contacted her PCP who advised her to go into the ED.  An I+D was done at the [facility] and she was placed on IV Vancomycin and transferred to [facility] for admission.      Review of Systems:  Constitutional: No Weight Loss, No Weight Gain, Night Sweats, +Fevers, Chills, Dizziness, Fatigue, or Generalized Weakness HEENT: No Headaches, Difficulty Swallowing,Tooth/Dental Problems,Sore Throat,  No Sneezing, Rhinitis, Ear Ache, Nasal Congestion, or Post Nasal Drip,  Cardio-vascular:  No Chest pain, Orthopnea, PND, Edema in Lower Extremities, Anasarca, Dizziness, Palpitations  Resp: No Dyspnea, No DOE, No Productive Cough, No Non-Productive Cough, No Hemoptysis, No Wheezing.    GI: No Heartburn, Indigestion, Abdominal Pain, Nausea, Vomiting, Diarrhea, Hematemesis, Hematochezia, Melena, Change in Bowel Habits,  Loss of Appetite  GU: No Dysuria, Change in Color of Urine, No Urgency or Frequency, No Flank pain.  Musculoskeletal: +Joint Pain, +Swelling, No Decreased Range of Motion, No Back Pain.  Neurologic: No Syncope, No Seizures, Muscle Weakness, Paresthesia, Vision Disturbance or Loss, No Diplopia, No Vertigo, No Difficulty Walking,  Skin:  +Lesions on Left Shoulder and Left  Thigh Psych: No Change in Mood or Affect, No Depression or Anxiety, No Memory loss, No Confusion, or Hallucinations   Past Medical History  Diagnosis Date  . PERSONALITY DISORDER   . Chronic pain syndrome     chronic narcotics, dependancy hx - [place] for pain mgmt  . MIGRAINE HEADACHE   . Lyme disease   . ALLERGIC RHINITIS   . ANEMIA-IRON DEFICIENCY   . HYPERLIPIDEMIA   . GERD   . DIABETES MELLITUS, TYPE II   . DEPRESSION   . SLE (systemic lupus erythematosus)     r[place] ([place]) - chronic pred  . Psoriasis   . Addison's disease   . Degenerative joint disease   . DDD (degenerative disc disease)   . Phlebitis after infusion     left ?calf; "S/P phenergan/demerol injection"  . Blood clot in vein 2011; 02/04/11    "right jugular vein;left jugular vein"  . SEIZURE DISORDER     "lots of seizures; due to lupus"  . Anxiety   . Neuropathy     hands, feet, due to diabetes & back problem  . Surgical wound infection 02/15/2011    R shoulder and mastectomy site - related to PICC  . CARCINOMA, BREAST 08/2009 dx    R breast,  mets to liver - resolution s/p chemo, s/p B mastect  . Metastases to the liver   . RA (rheumatoid arthritis)   . Hypothyroidism pt states she has never had hy pothyroidism     Past Surgical History  Procedure Laterality Date  . Liver bopsy  09/2009  . Port a cath placement  09/2009; 11/19/10  . Port-a-cath removal  04/2010    "due to staph & strept"  . Port-a-cath removal  12/23/2010    Procedure: REMOVAL PORT-A-CATH;  Surgeon: Pedro Earls, MD;  Location: [facility] ORS;  Service: General;  Laterality: Left;  . Breast surgery  09/2009    right breast biopsy  . Mastectomy  11/19/10    bilateral mastectomy   . I&d extremity  ~ 2010    right hand; S/P "dog bite"  . Peripherally inserted central catheter insertion    . Spider bite        Prior to Admission medications   Medication Sig Start Date End Date Taking? Authorizing Provider  acetaminophen  (TYLENOL) 325 MG tablet Take 325-650 mg by mouth every 6 (six) hours as needed.    Historical Provider, MD  bisacodyl (BISACODYL) 5 MG EC tablet Take 5 mg by mouth daily as needed for moderate constipation.    Historical Provider, MD  clonazePAM (KLONOPIN) 1 MG tablet Take 1 tablet (1 mg total) by mouth 2 (two) times daily as needed for anxiety. 10/18/12   Rowe Clack, MD  docusate sodium (COLACE) 100 MG capsule Take 200 mg by mouth 2 (two) times daily.     Historical Provider, MD  DULoxetine (CYMBALTA) 60 MG capsule Take 1 capsule (60 mg total) by mouth 2 (two) times daily. 06/11/12   Rowe Clack, MD  furosemide (LASIX) 20 MG tablet Take 40 mg by mouth daily.     Historical Provider, MD  gabapentin (NEURONTIN) 300 MG capsule Take 3 capsules (900 mg total) by mouth 3 (three) times daily. 04/25/12   Rowe Clack, MD  hydrochlorothiazide (HYDRODIURIL) 25 MG tablet Take 25 mg by mouth daily.    Historical Provider, MD  HYDROcodone-acetaminophen (NORCO/VICODIN) 5-325 MG per tablet Take 1 tablet by mouth every 6 (six) hours as needed. 02/12/13   Merryl Hacker, MD  Lactobacillus (ACIDOPHILUS PO) Take 3 capsules by mouth 3 (three) times daily.    Historical Provider, MD  OLANZapine (ZYPREXA) 5 MG tablet Take 5 mg by mouth 3 (three) times daily.    Historical Provider, MD  pantoprazole (PROTONIX) 40 MG tablet Take 40 mg by mouth daily.    Historical Provider, MD  potassium chloride SA (K-DUR,KLOR-CON) 20 MEQ tablet Take 40 mEq by mouth daily.     Historical Provider, MD  predniSONE (DELTASONE) 10 MG tablet Take 10 mg by mouth daily with breakfast.    Historical Provider, MD  rOPINIRole (REQUIP) 1 MG tablet Take 1 mg by mouth at bedtime.    Historical Provider, MD  senna-docusate (SENOKOT-S) 8.6-50 MG per tablet Take 1 tablet by mouth 2 (two) times daily as needed.     Historical Provider, MD  spironolactone (ALDACTONE) 25 MG tablet Take 25 mg by mouth 2 (two) times daily.    Historical  Provider, MD  spironolactone-hydrochlorothiazide (ALDACTAZIDE) 25-25 MG per tablet Take 1 tablet by mouth 2 (two) times daily.    Historical Provider, MD      Allergies  Allergen Reactions  . Ammonia Other (See Comments)    Ended up on ventilator from ammonia tabs  . Contrast Media [Iodinated Diagnostic Agents] Other (See Comments)    Doesn't breath well.  . Iohexol Other (See Comments)    SOB   . Methotrexate Anaphylaxis  . Midazolam Hcl Anaphylaxis  . Nalbuphine Other (See Comments)    Can't breathe well  . Infliximab Nausea And Vomiting  . Ketorolac Tromethamine Other (  See Comments)    Injectable doesn't work and pill hurts stomach.  . Nsaids Nausea And Vomiting  . Celecoxib Rash     Social History:  reports that she has never smoked. She has never used smokeless tobacco. She reports that she does not drink alcohol or use illicit drugs.     Family History  Problem Relation Age of Onset  . Lung cancer Mother   . Lung cancer Father   . Arthritis Other   . Diabetes Other   . Hyperlipidemia Other        Physical Exam:  GEN:  Obese  58 y.o. Caucasian female examined and in no acute distress; cooperative with exam Filed Vitals:   08/21/13 1800 08/21/13 2012 08/21/13 2336 08/22/13 0038  BP:  132/65 136/57 139/67  Pulse:  88 91 86  Temp: 98.2 F (36.8 C) 98.2 F (36.8 C)  99 F (37.2 C)  TempSrc:  Oral  Oral  Resp:  16  18  Height:    5\' 4"  (1.626 m)  Weight:    84.8 kg (186 lb 15.2 oz)  SpO2:  92% 97% 96%   Blood pressure 139/67, pulse 86, temperature 99 F (37.2 C), temperature source Oral, resp. rate 18, height 5\' 4"  (1.626 m), weight 84.8 kg (186 lb 15.2 oz), SpO2 96.00%. PSYCH: She is alert and oriented x4; does not appear anxious does not appear depressed; affect is normal HEENT: Normocephalic and Atraumatic, Mucous membranes pink; PERRLA; EOM intact; Fundi:  Benign;  No scleral icterus, Nares: Patent, Oropharynx: Clear, Edentulous,    Neck:  FROM, No  Cervical Lymphadenopathy nor Thyromegaly or Carotid Bruit; No JVD; Breasts:: Not examined CHEST WALL: No tenderness CHEST: Normal respiration, clear to auscultation bilaterally HEART: Regular rate and rhythm; no murmurs rubs or gallops BACK: No kyphosis or scoliosis; No CVA tenderness ABDOMEN: Positive Bowel Sounds, Obese, Soft Non-Tender; No Masses, No Organomegaly, No Pannus; No Intertriginous candida. Rectal Exam: Not done EXTREMITIES: No Cyanosis, Clubbing, or Edema; +Ulcerations. Genitalia: not examined PULSES: 2+ and symmetric SKIN: Normal hydration, No rash or +Ulceration x 2 On Left Anterior Thigh with a + Erythematous Border of 2 cm diameter around  +Surgical Incision site of I+D with Iodoform packing extruding  CNS:  Alert and Oriented x 4, No Focal Deficits,    Vascular: pulses palpable throughout    Labs on Admission:  Basic Metabolic Panel:  Recent Labs Lab 08/21/13 1957  NA 137  K 3.4*  CL 93*  CO2 27  GLUCOSE 111*  BUN 14  CREATININE 0.90  CALCIUM 9.6   Liver Function Tests: No results found for this basename: AST, ALT, ALKPHOS, BILITOT, PROT, ALBUMIN,  in the last 168 hours No results found for this basename: LIPASE, AMYLASE,  in the last 168 hours No results found for this basename: AMMONIA,  in the last 168 hours CBC:  Recent Labs Lab 08/21/13 1957  WBC 11.0*  HGB 12.1  HCT 37.2  MCV 92.3  PLT 315   Cardiac Enzymes: No results found for this basename: CKTOTAL, CKMB, CKMBINDEX, TROPONINI,  in the last 168 hours  BNP (last 3 results)  Recent Labs  11/11/12 0500  PROBNP 462.1*   CBG:  Recent Labs Lab 08/21/13 2337  GLUCAP 157*    Radiological Exams on Admission: No results found.     Assessment/Plan:   58 y.o. female with  Active Problems: 1.    Cellulitis of left lower extremity:      IV Vancomycin.  2.    Leukocytosis, unspecified:  Due to #1.     Monitor Trend.    3.    Hypokalemia:     Replete K+.    4.    Breasts  Ca, 08/2009 - hx liver mets, resolved s/p chemo:      Stable.    5.    DIABETES MELLITUS, TYPE II:  Most likely due to Prednisone Rx.     On No DM Medications, verify Home medications, and SSI coverage PRN, check HbA1C.  .       6.    Hypertension:     On HCTZ and Aldactone rx, Monitor BPs.      7.    Chronic pain syndrome:  Due to SLE   On Vicodin rx.  And Prednisone Rx.    8.    SLE-on prednisone-Follow at [facility]:     On chronic prednisone Rx.    9.   DVT Prophylaxis    With Lovenox.       Code Status:  FULL CODE  Family Communication:    No Family Present Disposition Plan:       Inpatient  Time spent:  [place] [facility] Pager [phone]   If [place] Please Contact the Day Rounding Team MD for [facility]  If 7PM-7AM, Please Contact night-coverage  [email or web address] Password [email or web address] 08/22/2013, 5:04 AM

## 2013-08-22 NOTE — Progress Notes (Signed)
ECG technology used to confirm placement.  Able to correct technical error and place printed version of ECG on chart.  PICC ready to use.

## 2013-08-22 NOTE — ED Notes (Signed)
Pt report given to Jimmie Molly, RN on 6E at [facility]

## 2013-08-22 NOTE — Progress Notes (Signed)
New Admission Note:  Arrival Method: via stretcher with carelink  Mental Orientation: Alert and orientedx4 Telemetry: N/A Assessment: Completed Skin: Cellulitis of left thigh and left arm  Safety Measures: Safety Fall Prevention Plan was given, discussed and signed. Admission: Completed [address] [place] Orientation: Patient has been orientated to the room, unit and the staff.  Waiting on orders from MD. Will continue to monitor the patient. Call light has been placed within reach and bed alarm has been activated.   Leandro Reasoner BSN, RN  Phone Number: [phone] [facility]

## 2013-08-22 NOTE — Progress Notes (Signed)
Peripherally Inserted Central Catheter/Midline Placement  The IV Nurse has discussed with the patient and/or persons authorized to consent for the patient, the purpose of this procedure and the potential benefits and risks involved with this procedure.  The benefits include less needle sticks, lab draws from the catheter and patient may be discharged home with the catheter.  Risks include, but not limited to, infection, bleeding, blood clot (thrombus formation), and puncture of an artery; nerve damage and irregular heat beat.  Alternatives to this procedure were also discussed.  PICC/Midline Placement Documentation        [name] Carr 08/22/2013, 9:15 AM

## 2013-08-22 NOTE — Progress Notes (Signed)
[place] for Vancomycin/ now add cefazolin Indication: cellulitis  Allergies  Allergen Reactions  . Ammonia Other (See Comments)    Ended up on ventilator from ammonia tabs  . Contrast Media [Iodinated Diagnostic Agents] Other (See Comments)    Doesn't breath well.  . Iohexol Other (See Comments)    SOB   . Methotrexate Anaphylaxis  . Midazolam Hcl Anaphylaxis  . Nalbuphine Other (See Comments)    Can't breathe well  . Infliximab Nausea And Vomiting  . Ketorolac Tromethamine Other (See Comments)    Injectable doesn't work and pill hurts stomach.  . Nsaids Nausea And Vomiting  . Celecoxib Rash    Patient Measurements: Height: 5\' 4"  (162.6 cm) Weight: 186 lb 15.2 oz (84.8 kg) IBW/kg (Calculated) : 54.7  Vital Signs: Temp: 98.6 F (37 C) (07/16 0508) Temp src: Oral (07/16 0508) BP: 147/70 mmHg (07/16 0508) Pulse Rate: 78 (07/16 0508) Intake/Output from previous day:   Intake/Output from this shift: Total I/O In: 250 [IV Piggyback:250] Out: -   Labs:  Recent Labs  08/21/13 1957  WBC 11.0*  HGB 12.1  PLT 315  CREATININE 0.90   Estimated Creatinine Clearance: 71.7 ml/min (by C-G formula based on Cr of 0.9). No results found for this basename: VANCOTROUGH, Corlis Leak, VANCORANDOM, [place], GENTPEAK, GENTRANDOM, TOBRATROUGH, TOBRAPEAK, TOBRARND, AMIKACINPEAK, AMIKACINTROU, AMIKACIN,  in the last 72 hours   Microbiology: Recent Results (from the past 720 hour(s))  MRSA PCR SCREENING     Status: Abnormal   Collection Time    08/22/13  2:36 AM      Result Value Ref Range Status   MRSA by PCR POSITIVE (*) NEGATIVE Final   Comment:            The GeneXpert MRSA Assay (FDA     approved for NASAL specimens     only), is one component of a     comprehensive MRSA colonization     surveillance program. It is not     intended to diagnose MRSA     infection nor to guide or     monitor treatment for     MRSA infections.     RESULT  CALLED TO, READ BACK BY AND VERIFIED WITHPollie Meyer RN [id number] AT 0421 SKEEN,P    Medications:  Prescriptions prior to admission  Medication Sig Dispense Refill  . acetaminophen (TYLENOL) 500 MG tablet Take 1,000 mg by mouth every 6 (six) hours as needed (pain).      . bisacodyl (BISACODYL) 5 MG EC tablet Take 5 mg by mouth daily as needed for moderate constipation.      . clonazePAM (KLONOPIN) 1 MG tablet Take 1 mg by mouth 2 (two) times daily.      Marland Kitchen docusate sodium (COLACE) 100 MG capsule Take 200 mg by mouth 2 (two) times daily.       . DULoxetine (CYMBALTA) 60 MG capsule Take 1 capsule (60 mg total) by mouth 2 (two) times daily.  180 capsule  1  . EPINEPHrine (EPIPEN) 0.3 mg/0.3 mL IJ SOAJ injection Inject 0.3 mg into the muscle once.      . furosemide (LASIX) 20 MG tablet Take 20 mg by mouth daily.       Marland Kitchen gabapentin (NEURONTIN) 300 MG capsule Take 3 capsules (900 mg total) by mouth 3 (three) times daily.  810 capsule  0  . hydrochlorothiazide (HYDRODIURIL) 25 MG tablet Take 25 mg by mouth daily as needed (  for lower leg edema).       Marland Kitchen HYDROcodone-acetaminophen (NORCO) 10-325 MG per tablet Take 1 tablet by mouth every 6 (six) hours as needed (pain).      . Lactobacillus (ACIDOPHILUS PO) Take 1-2 capsules by mouth 3 (three) times daily.       . pantoprazole (PROTONIX) 40 MG tablet Take 40 mg by mouth daily.      . potassium chloride SA (K-DUR,KLOR-CON) 20 MEQ tablet Take 20 mEq by mouth daily.       . predniSONE (DELTASONE) 5 MG tablet Take 7.5 mg by mouth daily with breakfast.      . promethazine (PHENERGAN) 25 MG suppository Place 25 mg rectally every 6 (six) hours as needed for nausea or vomiting.      Marland Kitchen rOPINIRole (REQUIP) 1 MG tablet Take 1 mg by mouth at bedtime.      . traZODone (DESYREL) 50 MG tablet Take 50-150 mg by mouth at bedtime as needed for sleep.      Marland Kitchen senna-docusate (SENOKOT-S) 8.6-50 MG per tablet Take 1 tablet by mouth 2 (two) times daily as needed.         Assessment: 58 yo female with LLE cellulitis/abcess for empiric antibiotics. Patient previously started on vancomycin now to add cefazolin.  No cultures pending at this time.  Renal function ok.    Goal of Therapy:  Vancomycin trough level 10-15 mcg/ml  Plan:  Vancomycin 1250 mg IV Q12H Cefazolin 1gram Q8H  Thank you, Vivia Ewing, PharmD Clinical Pharmacist - Resident Pager: [phone] Pharmacy: [phone] 08/22/2013 3:29 PM

## 2013-08-22 NOTE — Consult Note (Signed)
Reason for Consult: Recurrent thigh abscess Referring Physician: Debbe Odea, MD   [name] Carr is an 58 y.o. female.  HPI: Pt was hospitalized at [facility] 7/4-08/13/13 with cellulitis.  She had an I/D of site and was treated with IV vancomycin and Zosyn.  She was sent home and says she cannot take PO antibiotics.  She returned to [facility] and had a second site drained.  She was admitted to Medicine and required a new PICC line for antibiotics.  She has had somewhere around 30 PICC lines, the newest placed today.  She continues to have pain and swelling Left thigh.  I removed the wicks from both opened sites on the left thigh, and both are about 2 cm long and deep.  Minimal swelling and drainage currently, some mild cellulitis present in the  Indurated area that is about 7 x 8 cm.  There is still some mild bogginess to the site, but no drainage, or significant erythema.  We are ask to see.  Past Medical History  Diagnosis Date   SLE (systemic lupus erythematosus) on chronic steroids   DIABETES MELLITUS, TYPE II   CARCINOMA, BREAST   R breast,  mets to liver - resolution s/p chemo, s/p B mastectomies  Dr. Excell Seltzer.  Currently reported cancer free.  SEIZURE DISORDER/lots of seizures; due to lupus"   Chronic pain syndrome   chronic narcotics, dependancy hx - [place] for pain mgmt         Degenerative joint disease  DDD (degenerative disc disease)     Surgical wound infection 02/15/2011  R shoulder and mastectomy site - related to PICC     PERSONALITY DISORDER/DEPRESSION/Anxiety   Hx ofMIGRAINE HEADACHE   Lyme disease   ALLERGIC RHINITIS   ANEMIA-IRON DEFICIENCY   HYPERLIPIDEMIA   GERD   Psoriasis   Addison's disease   Phlebitis after infusion   left ?calf; "S/P phenergan/demerol injection"  Blood clot in vein 2011; 02/04/11  "right jugular vein;left jugular vein"  RA (rheumatoid arthritis)   Neuropathy   hands, feet, due to diabetes & back problem  Hypothyroidism pt states  she has never had hy pothyroidism    Past Surgical History  Procedure Laterality Date  . Liver bopsy  09/2009  . Port a cath placement  09/2009; 11/19/10  . Port-a-cath removal  04/2010    "due to staph & strept"  . Port-a-cath removal  12/23/2010    Procedure: REMOVAL PORT-A-CATH;  Surgeon: Pedro Earls, MD;  Location: [facility];  Service: General;  Laterality: Left;  . Breast surgery  09/2009    right breast biopsy  . Mastectomy  11/19/10    bilateral mastectomy   . I&d extremity  ~ 2010    right hand; S/P "dog bite"  . Peripherally inserted central catheter insertion    . Spider bite      Family History  Problem Relation Age of Onset  . Lung cancer Mother   . Lung cancer Father   . Arthritis Other   . Diabetes Other   . Hyperlipidemia Other     Social History:  reports that she has never smoked. She has never used smokeless tobacco. She reports that she does not drink alcohol or use illicit drugs.  Allergies:  Allergies  Allergen Reactions  . Ammonia Other (See Comments)    Ended up on ventilator from ammonia tabs  . Contrast Media [Iodinated Diagnostic Agents] Other (See Comments)    Doesn't breath well.  . Iohexol  Other (See Comments)    SOB   . Methotrexate Anaphylaxis  . Midazolam Hcl Anaphylaxis  . Nalbuphine Other (See Comments)    Can't breathe well  . Infliximab Nausea And Vomiting  . Ketorolac Tromethamine Other (See Comments)    Injectable doesn't work and pill hurts stomach.  . Nsaids Nausea And Vomiting  . Celecoxib Rash    Medications:  Prior to Admission:  Prescriptions prior to admission  Medication Sig Dispense Refill  . acetaminophen (TYLENOL) 500 MG tablet Take 1,000 mg by mouth every 6 (six) hours as needed (pain).      . bisacodyl (BISACODYL) 5 MG EC tablet Take 5 mg by mouth daily as needed for moderate constipation.      . clonazePAM (KLONOPIN) 1 MG tablet Take 1 mg by mouth 2 (two) times daily.      Marland Kitchen docusate sodium (COLACE) 100 MG  capsule Take 200 mg by mouth 2 (two) times daily.       . DULoxetine (CYMBALTA) 60 MG capsule Take 1 capsule (60 mg total) by mouth 2 (two) times daily.  180 capsule  1  . EPINEPHrine (EPIPEN) 0.3 mg/0.3 mL IJ SOAJ injection Inject 0.3 mg into the muscle once.      . furosemide (LASIX) 20 MG tablet Take 20 mg by mouth daily.       Marland Kitchen gabapentin (NEURONTIN) 300 MG capsule Take 3 capsules (900 mg total) by mouth 3 (three) times daily.  810 capsule  0  . hydrochlorothiazide (HYDRODIURIL) 25 MG tablet Take 25 mg by mouth daily as needed (for lower leg edema).       Marland Kitchen HYDROcodone-acetaminophen (NORCO) 10-325 MG per tablet Take 1 tablet by mouth every 6 (six) hours as needed (pain).      . Lactobacillus (ACIDOPHILUS PO) Take 1-2 capsules by mouth 3 (three) times daily.       . pantoprazole (PROTONIX) 40 MG tablet Take 40 mg by mouth daily.      . potassium chloride SA (K-DUR,KLOR-CON) 20 MEQ tablet Take 20 mEq by mouth daily.       . predniSONE (DELTASONE) 5 MG tablet Take 7.5 mg by mouth daily with breakfast.      . promethazine (PHENERGAN) 25 MG suppository Place 25 mg rectally every 6 (six) hours as needed for nausea or vomiting.      Marland Kitchen rOPINIRole (REQUIP) 1 MG tablet Take 1 mg by mouth at bedtime.      . traZODone (DESYREL) 50 MG tablet Take 50-150 mg by mouth at bedtime as needed for sleep.      Marland Kitchen senna-docusate (SENOKOT-S) 8.6-50 MG per tablet Take 1 tablet by mouth 2 (two) times daily as needed.        Scheduled: .  ceFAZolin (ANCEF) IV  1 g Intravenous 3 times per day  . Chlorhexidine Gluconate Cloth  6 each Topical Q0600  . clonazePAM  1 mg Oral BID  . docusate sodium  200 mg Oral BID  . DULoxetine  60 mg Oral BID  . enoxaparin (LOVENOX) injection  40 mg Subcutaneous Daily  . furosemide  20 mg Oral Daily  . gabapentin  900 mg Oral TID  . insulin aspart  0-9 Units Subcutaneous TID WC  . [START ON 08/23/2013] lactobacillus acidophilus  1 tablet Oral Daily  . mupirocin ointment  1  application Nasal BID  . [START ON 08/23/2013] pantoprazole  40 mg Oral Daily  . potassium chloride SA  20 mEq Oral BID  . [  START ON 08/23/2013] predniSONE  7.5 mg Oral Q breakfast  . rOPINIRole  1 mg Oral QHS  . sodium chloride  3 mL Intravenous Q12H  . vancomycin  1,250 mg Intravenous Q12H   Continuous:  SEG:BTDVVO chloride, acetaminophen, acetaminophen, alum & mag hydroxide-simeth, bisacodyl, hydrochlorothiazide, HYDROcodone-acetaminophen, HYDROmorphone (DILAUDID) injection, ondansetron (ZOFRAN) IV, ondansetron, senna-docusate, sodium chloride, sodium chloride, traZODone Anti-infectives   Start     Dose/Rate Route Frequency Ordered Stop   08/22/13 1545  ceFAZolin (ANCEF) IVPB 1 g/50 mL premix     1 g 100 mL/hr over 30 Minutes Intravenous 3 times per day 08/22/13 1530     08/22/13 0600  vancomycin (VANCOCIN) 1,250 mg in sodium chloride 0.9 % 250 mL IVPB     1,250 mg 166.7 mL/hr over 90 Minutes Intravenous Every 12 hours 08/22/13 0505     08/21/13 2215  vancomycin (VANCOCIN) IVPB 1000 mg/200 mL premix     1,000 mg 200 mL/hr over 60 Minutes Intravenous  Once 08/21/13 2213 08/21/13 2347      Results for orders placed during the hospital encounter of 08/21/13 (from the past 48 hour(s))  CBC     Status: Abnormal   Collection Time    08/21/13  7:57 PM      Result Value Ref Range   WBC 11.0 (*) 4.0 - 10.5 K/uL   RBC 4.03  3.87 - 5.11 MIL/uL   Hemoglobin 12.1  12.0 - 15.0 g/dL   HCT 37.2  36.0 - 46.0 %   MCV 92.3  78.0 - 100.0 fL   MCH 30.0  26.0 - 34.0 pg   MCHC 32.5  30.0 - 36.0 g/dL   RDW 14.4  11.5 - 15.5 %   Platelets 315  150 - 400 K/uL  BASIC METABOLIC PANEL     Status: Abnormal   Collection Time    08/21/13  7:57 PM      Result Value Ref Range   Sodium 137  137 - 147 mEq/L   Potassium 3.4 (*) 3.7 - 5.3 mEq/L   Chloride 93 (*) 96 - 112 mEq/L   CO2 27  19 - 32 mEq/L   Glucose, Bld 111 (*) 70 - 99 mg/dL   BUN 14  6 - 23 mg/dL   Creatinine, Ser 0.90  0.50 - 1.10 mg/dL    Calcium 9.6  8.4 - 10.5 mg/dL   GFR calc non Af Amer 69 (*) >90 mL/min   GFR calc Af Amer 80 (*) >90 mL/min   Comment: (NOTE)     The eGFR has been calculated using the CKD EPI equation.     This calculation has not been validated in all clinical situations.     eGFR's persistently <90 mL/min signify possible Chronic Kidney     Disease.   Anion gap 17 (*) 5 - 15  CBG MONITORING, ED     Status: Abnormal   Collection Time    08/21/13 11:37 PM      Result Value Ref Range   Glucose-Capillary 157 (*) 70 - 99 mg/dL   Comment 1 Notify RN     Comment 2 Documented in Chart    MRSA PCR SCREENING     Status: Abnormal   Collection Time    08/22/13  2:36 AM      Result Value Ref Range   MRSA by PCR POSITIVE (*) NEGATIVE   Comment:            The GeneXpert MRSA Assay (FDA  approved for NASAL specimens     only), is one component of a     comprehensive MRSA colonization     surveillance program. It is not     intended to diagnose MRSA     infection nor to guide or     monitor treatment for     MRSA infections.     RESULT CALLED TO, READ BACK BY AND VERIFIED WITH:     BIRAGO,M RN [id number] AT 0421 SKEEN,P  GLUCOSE, CAPILLARY     Status: Abnormal   Collection Time    08/22/13  6:44 AM      Result Value Ref Range   Glucose-Capillary 111 (*) 70 - 99 mg/dL  GLUCOSE, CAPILLARY     Status: Abnormal   Collection Time    08/22/13  7:35 AM      Result Value Ref Range   Glucose-Capillary 116 (*) 70 - 99 mg/dL  GLUCOSE, CAPILLARY     Status: Abnormal   Collection Time    08/22/13 11:27 AM      Result Value Ref Range   Glucose-Capillary 129 (*) 70 - 99 mg/dL  CBC     Status: Abnormal   Collection Time    08/22/13  3:20 PM      Result Value Ref Range   WBC 7.9  4.0 - 10.5 K/uL   RBC 3.83 (*) 3.87 - 5.11 MIL/uL   Hemoglobin 11.2 (*) 12.0 - 15.0 g/dL   HCT 35.2 (*) 36.0 - 46.0 %   MCV 91.9  78.0 - 100.0 fL   MCH 29.2  26.0 - 34.0 pg   MCHC 31.8  30.0 - 36.0 g/dL   RDW 14.5  11.5 - 15.5 %     Platelets 280  150 - 400 K/uL    Dg Chest Port 1 View  08/22/2013   CLINICAL DATA:  Status post PICC placement  EXAM: PORTABLE CHEST - 1 VIEW  COMPARISON:  07/23/13  FINDINGS: Cardiac shadow is stable. A right-sided PICC line is now seen at the cavoatrial junction. The lungs are clear bilaterally. No acute bony abnormality is seen.  IMPRESSION: PICC line in satisfactory position.   Electronically Signed   By: Inez Catalina M.D.   On: 08/22/2013 11:05    Review of Systems  Constitutional: Positive for fever.  HENT: Negative.   Eyes: Negative.   Respiratory: Negative.   Cardiovascular: Negative.   Gastrointestinal: Negative.   Genitourinary: Negative.   Musculoskeletal: Positive for back pain. Myalgias: chronic back pain.  Skin: Positive for itching and rash (left shoulder).  Neurological: Negative.   Endo/Heme/Allergies: Negative.   Psychiatric/Behavioral: Positive for depression. The patient is nervous/anxious.    Blood pressure 147/70, pulse 78, temperature 98.6 F (37 C), temperature source Oral, resp. rate 18, height _0  (1.626 m), weight 84.8 kg (186 lb 15.2 oz), SpO2 95.00%. Physical Exam  Constitutional: She is oriented to person, place, and time. She appears well-developed and well-nourished. No distress.  HENT:  Head: Normocephalic and atraumatic.  Left Ear: External ear normal.  Eyes: EOM are normal. Pupils are equal, round, and reactive to light. Right eye exhibits no discharge. Left eye exhibits no discharge.  Neck: Normal range of motion. Neck supple. No JVD present. No tracheal deviation present. No thyromegaly present.  Cardiovascular: Normal rate, regular rhythm, normal heart sounds and intact distal pulses.  Exam reveals no gallop.   No murmur heard. Respiratory: Effort normal and breath sounds normal. No respiratory distress. She has  no wheezes. She has no rales. She exhibits no tenderness.  GI: Soft. Bowel sounds are normal. She exhibits no distension and no  mass. There is no tenderness. There is no rebound and no guarding.  Musculoskeletal: She exhibits no edema.  Lymphadenopathy:    She has no cervical adenopathy.  Neurological: She is alert and oriented to person, place, and time. No cranial nerve deficit.  Skin: Abrasion noted. She is not diaphoretic.     Psychiatric: She has a normal mood and affect. Her behavior is normal. Judgment and thought content normal.    Assessment/Plan: 1. Recurrent abscess left thigh 2.  SLE on steroids 30 years 3.  AODM 4.  BREAST CA WITH LIVER METS, Chemotherapy/ in remission 5.  Hx of seizures 6.  Chronic back pain and narcotic dependence 7.  Hx of personality disorder, anxiety, depression 8.  Hx of lyme disease 9. GERD 10. Addison's disease 11.  RA  Plan:  Continue dressing, she can shower and get soap and water into the sites.  We will look at it in the AM and see if it needs further drainage,but I don't think it will. Continue antibiotics.  Jenea Dake 08/22/2013, 4:24 PM

## 2013-08-22 NOTE — Progress Notes (Signed)
Patient requesting pureed/soft diet because she does not have any teeth. Order placed with carb mod added.  Joellen Jersey, RN.

## 2013-08-22 NOTE — Progress Notes (Addendum)
[facility]    This is a 58 year old female with past medical history of SLE, Sjogren's syndrome, breast cancer with liver metastases in remission, diabetes mellitus type 2 and ongoing left upper arm abscesses and cellulitis. She states that she injured her left thigh with some thorns while working out in the yard which became red swollen and painful. She went to [place] [facility] where the abscess was drained and, subsequently, she was sent to [facility] for admission. She received a PICC line early this morning and was started on vancomycin. She's had multiple admissions at [facility] for recurrent abscesses of the left upper arm. These admissions are as follows: 5/28-6/1 6/5-6/12 6/15-6/18 6/25-6/29 7/4-7/7 She states that she is intolerant of oral antibiotics and was never discharged from [place] with any IV antibiotics. This time she decided to come to [facility] and be treated at [facility] I have examined her and it appears that he thigh abscess needs further I&D and the left upper arm abscess needs drainage as well.  Principal Problem:   Cellulitis and abscess of left leg and shoulder in a diabetic - I have called surgery for drainage of these abscesses and cultures-it does not appear that a culture was sent when the abscess was drained at [facility] -Will continue PICC line and vancomycin-add Ancef  Active Problems:    DIABETES MELLITUS, TYPE II neuropathy -She states she controls it with her diet but does not want to be on a carb modified diet while she is here-will place her on a sliding scale insulin prior to meals -Continue gabapentin    SLE-on prednisone-Follow at [facility] -Resume prednisone-no need to give stress dose steroids as blood pressure is stable    Chronic pain syndrome -Continue Norco with Dilaudid for breakthrough pain    Stage IV breast cancer in remission     Hypokalemia -Replace  Hypertension -Continue home meds and Lasix  Debbe Odea, MD

## 2013-08-23 ENCOUNTER — Encounter (HOSPITAL_COMMUNITY): Payer: Self-pay | Admitting: *Deleted

## 2013-08-23 ENCOUNTER — Inpatient Hospital Stay (HOSPITAL_COMMUNITY): Payer: Medicare Other

## 2013-08-23 DIAGNOSIS — M329 Systemic lupus erythematosus, unspecified: Secondary | ICD-10-CM

## 2013-08-23 DIAGNOSIS — E119 Type 2 diabetes mellitus without complications: Secondary | ICD-10-CM

## 2013-08-23 LAB — GLUCOSE, CAPILLARY
GLUCOSE-CAPILLARY: 118 mg/dL — AB (ref 70–99)
Glucose-Capillary: 101 mg/dL — ABNORMAL HIGH (ref 70–99)
Glucose-Capillary: 115 mg/dL — ABNORMAL HIGH (ref 70–99)
Glucose-Capillary: 118 mg/dL — ABNORMAL HIGH (ref 70–99)
Glucose-Capillary: 122 mg/dL — ABNORMAL HIGH (ref 70–99)
Glucose-Capillary: 124 mg/dL — ABNORMAL HIGH (ref 70–99)

## 2013-08-23 LAB — HEMOGLOBIN A1C
Hgb A1c MFr Bld: 6.5 % — ABNORMAL HIGH (ref <5.7)
MEAN PLASMA GLUCOSE: 140 mg/dL — AB (ref <117)

## 2013-08-23 MED ORDER — HYDROMORPHONE HCL PF 1 MG/ML IJ SOLN
1.0000 mg | INTRAMUSCULAR | Status: DC | PRN
  Administered 2013-08-23 – 2013-08-25 (×10): 2 mg via INTRAVENOUS
  Administered 2013-08-25: 1 mg via INTRAVENOUS
  Administered 2013-08-25 (×2): 2 mg via INTRAVENOUS
  Administered 2013-08-25: 1 mg via INTRAVENOUS
  Administered 2013-08-26 (×5): 2 mg via INTRAVENOUS
  Filled 2013-08-23 (×3): qty 2
  Filled 2013-08-23: qty 1
  Filled 2013-08-23 (×3): qty 2
  Filled 2013-08-23: qty 1
  Filled 2013-08-23 (×11): qty 2

## 2013-08-23 MED ORDER — HYDROCODONE-ACETAMINOPHEN 10-325 MG PO TABS
1.0000 | ORAL_TABLET | Freq: Four times a day (QID) | ORAL | Status: DC
  Administered 2013-08-23 – 2013-08-26 (×9): 1 via ORAL
  Filled 2013-08-23 (×10): qty 1

## 2013-08-23 NOTE — Progress Notes (Signed)
Patient ID: [name] Carr, female   DOB: [birth year], 58 y.o.   MRN: [id number]    Subjective: Patient continues to complain of pain at her left thigh.  Objective: Vital signs in last 24 hours: Temp:  [97.7 F (36.5 C)-98.4 F (36.9 C)] 98.4 F (36.9 C) (07/17 1000) Pulse Rate:  [70-80] 75 (07/17 1000) Resp:  [18] 18 (07/17 1000) BP: (124-131)/(73-79) 124/76 mmHg (07/17 1000) SpO2:  [98 %-99 %] 98 % (07/17 1000) Weight:  [186 lb 15.2 oz (84.8 kg)] 186 lb 15.2 oz (84.8 kg) (07/16 2037) Last BM Date: 08/22/13  Intake/Output from previous day: 07/16 0701 - 07/17 0700 In: 1070.3 [P.O.:240; I.V.:180.3; IV Piggyback:650] Out: -  Intake/Output this shift:    PE: Skin: The patient's incisional wounds are clean. There is no further purulent drainage. There is no erythema or edema noted. This area is very hard though. It has distinct edges and is nonmobile. It does not feel like classic induration  Lab Results:   Recent Labs  08/21/13 1957 08/22/13 1520  WBC 11.0* 7.9  HGB 12.1 11.2*  HCT 37.2 35.2*  PLT 315 280   BMET  Recent Labs  08/21/13 1957 08/22/13 1520  NA 137 139  K 3.4* 3.2*  CL 93* 95*  CO2 27 28  GLUCOSE 111* 163*  BUN 14 17  CREATININE 0.90 0.79  CALCIUM 9.6 8.9   PT/INR No results found for this basename: LABPROT, INR,  in the last 72 hours CMP     Component Value Date/Time   NA 139 08/22/2013 1520   NA 135 04/25/2013 1439   NA 135* 04/08/2013 1148   K 3.2* 08/22/2013 1520   K 3.5 04/25/2013 1439   K 4.6 04/08/2013 1148   CL 95* 08/22/2013 1520   CL 90* 04/25/2013 1439   CL 91* 03/15/2012 1410   CO2 28 08/22/2013 1520   CO2 32 04/25/2013 1439   CO2 28 04/08/2013 1148   GLUCOSE 163* 08/22/2013 1520   GLUCOSE 201* 04/25/2013 1439   GLUCOSE 268* 04/08/2013 1148   GLUCOSE 131* 03/15/2012 1410   BUN 17 08/22/2013 1520   BUN 26* 04/25/2013 1439   BUN 29.9* 04/08/2013 1148   CREATININE 0.79 08/22/2013 1520   CREATININE 0.9 04/25/2013 1439   CREATININE 1.2* 04/08/2013 1148    CALCIUM 8.9 08/22/2013 1520   CALCIUM 9.9 04/25/2013 1439   CALCIUM 9.6 04/08/2013 1148   PROT 8.5* 04/25/2013 1439   PROT 6.9 04/08/2013 1148   PROT 6.7 02/12/2013 0352   ALBUMIN 3.4* 04/08/2013 1148   ALBUMIN 2.9* 02/12/2013 0352   AST 21 04/25/2013 1439   AST 17 04/08/2013 1148   AST 15 02/12/2013 0352   ALT 36 04/25/2013 1439   ALT 18 04/08/2013 1148   ALT 25 02/12/2013 0352   ALKPHOS 102* 04/25/2013 1439   ALKPHOS 100 04/08/2013 1148   ALKPHOS 139* 02/12/2013 0352   BILITOT 0.70 04/25/2013 1439   BILITOT 0.34 04/08/2013 1148   BILITOT <0.2* 02/12/2013 0352   GFRNONAA >90 08/22/2013 1520   GFRAA >90 08/22/2013 1520   Lipase     Component Value Date/Time   LIPASE 25 08/09/2010 1749       Studies/Results: Dg Chest Port 1 View  08/22/2013   CLINICAL DATA:  Status post PICC placement  EXAM: PORTABLE CHEST - 1 VIEW  COMPARISON:  07/23/13  FINDINGS: Cardiac shadow is stable. A right-sided PICC line is now seen at the cavoatrial junction. The lungs are clear bilaterally.  No acute bony abnormality is seen.  IMPRESSION: PICC line in satisfactory position.   Electronically Signed   By: Inez Catalina M.D.   On: 08/22/2013 11:05    Anti-infectives: Anti-infectives   Start     Dose/Rate Route Frequency Ordered Stop   08/22/13 1545  ceFAZolin (ANCEF) IVPB 1 g/50 mL premix     1 g 100 mL/hr over 30 Minutes Intravenous 3 times per day 08/22/13 1530     08/22/13 0600  vancomycin (VANCOCIN) 1,250 mg in sodium chloride 0.9 % 250 mL IVPB     1,250 mg 166.7 mL/hr over 90 Minutes Intravenous Every 12 hours 08/22/13 0505     08/21/13 2215  vancomycin (VANCOCIN) IVPB 1000 mg/200 mL premix     1,000 mg 200 mL/hr over 60 Minutes Intravenous  Once 08/21/13 2213 08/21/13 2347       Assessment/Plan  1. Status post bedside incision and drainage is x2 of the left thigh abscesses 2. Questionable left thigh mass with superimposed infection 3. History of stage IV breast cancer  Plan: 1. This area on the patient's thigh is  not erythematous and did not feel like a typical abscess or induration. I discussed this patient with Dr. Ninfa Linden and we will proceed with a CT scan of the thigh to rule out a mass. She can have a regular diet as of right now. 2. Continue dressing changes to her current wounds.   LOS: 2 days    Kavi Almquist E 08/23/2013, 10:54 AM Pager: [phone]

## 2013-08-23 NOTE — Progress Notes (Signed)
Patient has been stating her pain medication is not effective. This RN has been alternating between PO and IV PRNs. Dr. Wynelle Cleveland notified of this. No new orders received.  Joellen Jersey, RN.

## 2013-08-23 NOTE — Consult Note (Signed)
I have seen and examined the patient and agree with the assessment and plans.  Hanan Mcwilliams A. Anthonee Gelin  MD, FACS  

## 2013-08-23 NOTE — Progress Notes (Signed)
[facility] Progress Note   [name] Carr ZOX:[id number] DOB: 1955-08-18 DOA: 08/21/2013 PCP: [name], MD  Brief narrative: This is a 58 year old female with past medical history of SLE, Sjogren's syndrome, breast cancer with liver metastases in remission, diabetes mellitus type 2 and ongoing left upper arm abscesses and cellulitis. She states that she injured her left thigh with some thorns while working out in the yard which became red swollen and painful. She went to [facility] where the abscess was drained and, subsequently, she was sent to [facility] for admission. She received a PICC and was started on vancomycin.  She's had multiple admissions at [facility] for recurrent abscesses of the left upper arm. These admissions are as follows:  5/28-6/1  6/5-6/12  6/15-6/18  6/25-6/29  7/4-7/7  She states that she is intolerant of oral antibiotics and was never discharged from [place] with any IV antibiotics.  This time she decided to come to [facility] and be treated at [facility]   Subjective: Pain in left thigh uncontrolled this AM- it was well controlled all night and she slept through the night.   Assessment/Plan: Principal Problem:   Cellulitis of left leg/ fever - CT reveals underlying granulomas but not acute abscess - appreciate surgery and ortho assistance - will continue Vancomycin and Ancef for now-    Active Problems: Cellulitis in left upper arm - looking better than yesterday    Stage IV breast cancer in remission    DIABETES MELLITUS, TYPE II - controlled without medications  Diabetic neuropathy Cont Gabapenting    Chronic pain syndrome - cont Norco at home dose- she did not use it at all last night and slept comfortably- suspect pain this AM was due to ambulating  - increase Dilaudid for acute pain control    SLE-on prednisone -Follow at [facility] - cont Prednisone   Code Status:  Full code Family Communication: none Disposition Plan: home  Consultants: Ortho General surgery  Procedures: 7/15- I and D in [facility] by ER  Antibiotics: Anti-infectives   Start     Dose/Rate Route Frequency Ordered Stop   08/22/13 1545  ceFAZolin (ANCEF) IVPB 1 g/50 mL premix     1 g 100 mL/hr over 30 Minutes Intravenous 3 times per day 08/22/13 1530     08/22/13 0600  vancomycin (VANCOCIN) 1,250 mg in sodium chloride 0.9 % 250 mL IVPB     1,250 mg 166.7 mL/hr over 90 Minutes Intravenous Every 12 hours 08/22/13 0505     08/21/13 2215  vancomycin (VANCOCIN) IVPB 1000 mg/200 mL premix     1,000 mg 200 mL/hr over 60 Minutes Intravenous  Once 08/21/13 2213 08/21/13 2347       DVT prophylaxis: lovenox    Objective: Filed Weights   08/21/13 1759 08/22/13 0038 08/22/13 2037  Weight: 85.276 kg (188 lb) 84.8 kg (186 lb 15.2 oz) 84.8 kg (186 lb 15.2 oz)    Vitals Filed Vitals:   08/22/13 0508 08/22/13 2037 08/23/13 0455 08/23/13 1000  BP: 147/70 127/73 131/79 124/76  Pulse: 78 70 80 75  Temp: 98.6 F (37 C) 97.7 F (36.5 C) 98.3 F (36.8 C) 98.4 F (36.9 C)  TempSrc: Oral Oral Oral Oral  Resp: 18 18 18 18   Height:      Weight:  84.8 kg (186 lb 15.2 oz)    SpO2: 95% 98% 99% 98%      Intake/Output Summary (  Last 24 hours) at 08/23/13 1529 Last data filed at 08/23/13 0900  Gross per 24 hour  Intake 820.33 ml  Output      0 ml  Net 820.33 ml     Exam: General: No acute respiratory distress Lungs: Clear to auscultation bilaterally without wheezes or crackles Cardiovascular: Regular rate and rhythm without murmur gallop or rub normal S1 and S2 Abdomen: Nontender, nondistended, soft, bowel sounds positive, no rebound, no ascites, no appreciable mass Extremities: No significant cyanosis, clubbing, or edema bilateral lower extremities Skin: ulcer and erythema in left upper arm improving- induration still presnt. No significant erythema or edema in left  thigh today. Continues to feel indurated  Data Reviewed: Basic Metabolic Panel:  Recent Labs Lab 08/21/13 1957 08/22/13 1520  NA 137 139  K 3.4* 3.2*  CL 93* 95*  CO2 27 28  GLUCOSE 111* 163*  BUN 14 17  CREATININE 0.90 0.79  CALCIUM 9.6 8.9   Liver Function Tests: No results found for this basename: AST, ALT, ALKPHOS, BILITOT, PROT, ALBUMIN,  in the last 168 hours No results found for this basename: LIPASE, AMYLASE,  in the last 168 hours No results found for this basename: AMMONIA,  in the last 168 hours CBC:  Recent Labs Lab 08/21/13 1957 08/22/13 1520  WBC 11.0* 7.9  HGB 12.1 11.2*  HCT 37.2 35.2*  MCV 92.3 91.9  PLT 315 280   Cardiac Enzymes: No results found for this basename: CKTOTAL, CKMB, CKMBINDEX, TROPONINI,  in the last 168 hours BNP (last 3 results)  Recent Labs  11/11/12 0500  PROBNP 462.1*   CBG:  Recent Labs Lab 08/22/13 1714 08/23/13 0024 08/23/13 0459 08/23/13 0749 08/23/13 1155  GLUCAP 135* 124* 118* 115* 122*    Recent Results (from the past 240 hour(s))  MRSA PCR SCREENING     Status: Abnormal   Collection Time    08/22/13  2:36 AM      Result Value Ref Range Status   MRSA by PCR POSITIVE (*) NEGATIVE Final   Comment:            The GeneXpert MRSA Assay (FDA     approved for NASAL specimens     only), is one component of a     comprehensive MRSA colonization     surveillance program. It is not     intended to diagnose MRSA     infection nor to guide or     monitor treatment for     MRSA infections.     RESULT CALLED TO, READ BACK BY AND VERIFIED WITHPollie Meyer RN [id number] AT 0421 SKEEN,P     Studies:  Recent x-ray studies have been reviewed in detail by the Attending Physician  Scheduled Meds:  Scheduled Meds: .  ceFAZolin (ANCEF) IV  1 g Intravenous 3 times per day  . Chlorhexidine Gluconate Cloth  6 each Topical Q0600  . clonazePAM  1 mg Oral BID  . docusate sodium  200 mg Oral BID  . DULoxetine  60 mg Oral  BID  . enoxaparin (LOVENOX) injection  40 mg Subcutaneous Daily  . furosemide  20 mg Oral Daily  . gabapentin  900 mg Oral TID  . HYDROcodone-acetaminophen  1 tablet Oral Q6H  . insulin aspart  0-9 Units Subcutaneous TID WC  . lactobacillus acidophilus  1 tablet Oral Daily  . mupirocin ointment  1 application Nasal BID  . pantoprazole  40 mg Oral Daily  .  potassium chloride SA  20 mEq Oral BID  . predniSONE  7.5 mg Oral Q breakfast  . rOPINIRole  1 mg Oral QHS  . sodium chloride  3 mL Intravenous Q12H  . vancomycin  1,250 mg Intravenous Q12H   Continuous Infusions:   Time spent on care of this patient: 35 min   [place], MD 08/23/2013, 3:29 PM  LOS: 2 days   [facility] Office  [phone] Pager - Text Page per Shea Evans   If 7PM-7AM, please contact night-coverage [email or web address]

## 2013-08-23 NOTE — Progress Notes (Signed)
I have seen and examined the patient and agree with the assessment and plans. Will get a CT scan of the leg to see if there is a deep abscess or even a mass given her history of stage IV malignancy  [name] Carr A. Ninfa Linden  MD, FACS

## 2013-08-24 DIAGNOSIS — L089 Local infection of the skin and subcutaneous tissue, unspecified: Secondary | ICD-10-CM

## 2013-08-24 DIAGNOSIS — L03114 Cellulitis of left upper limb: Secondary | ICD-10-CM | POA: Diagnosis present

## 2013-08-24 DIAGNOSIS — D72829 Elevated white blood cell count, unspecified: Secondary | ICD-10-CM

## 2013-08-24 DIAGNOSIS — Z8614 Personal history of Methicillin resistant Staphylococcus aureus infection: Secondary | ICD-10-CM

## 2013-08-24 DIAGNOSIS — R509 Fever, unspecified: Secondary | ICD-10-CM

## 2013-08-24 LAB — BASIC METABOLIC PANEL
ANION GAP: 9 (ref 5–15)
BUN: 16 mg/dL (ref 6–23)
CO2: 31 mEq/L (ref 19–32)
Calcium: 8.6 mg/dL (ref 8.4–10.5)
Chloride: 102 mEq/L (ref 96–112)
Creatinine, Ser: 0.74 mg/dL (ref 0.50–1.10)
GFR calc Af Amer: >90 mL/min (ref >90)
GFR calc non Af Amer: >90 mL/min (ref >90)
GLUCOSE: 118 mg/dL — AB (ref 70–99)
POTASSIUM: 3.7 meq/L (ref 3.7–5.3)
Sodium: 142 mEq/L (ref 137–147)

## 2013-08-24 LAB — GLUCOSE, CAPILLARY
GLUCOSE-CAPILLARY: 126 mg/dL — AB (ref 70–99)
GLUCOSE-CAPILLARY: 103 mg/dL — AB (ref 70–99)
GLUCOSE-CAPILLARY: 125 mg/dL — AB (ref 70–99)
Glucose-Capillary: 139 mg/dL — ABNORMAL HIGH (ref 70–99)

## 2013-08-24 MED ORDER — DIPHENHYDRAMINE HCL 50 MG/ML IJ SOLN
25.0000 mg | Freq: Once | INTRAMUSCULAR | Status: AC
  Administered 2013-08-24: 25 mg via INTRAVENOUS
  Filled 2013-08-24: qty 1

## 2013-08-24 MED ORDER — PROMETHAZINE HCL 25 MG/ML IJ SOLN
25.0000 mg | Freq: Four times a day (QID) | INTRAMUSCULAR | Status: DC | PRN
  Administered 2013-08-24 – 2013-08-27 (×9): 25 mg via INTRAVENOUS
  Filled 2013-08-24 (×9): qty 1

## 2013-08-24 MED ORDER — SODIUM CHLORIDE 0.9 % IV SOLN
600.0000 mg | Freq: Two times a day (BID) | INTRAVENOUS | Status: DC
  Administered 2013-08-24 – 2013-08-27 (×6): 600 mg via INTRAVENOUS
  Filled 2013-08-24 (×8): qty 600

## 2013-08-24 NOTE — Consult Note (Signed)
[facility] for [facility]  Total days of antibiotics 3        Day 3 vanco        Day 3 cefazolin               Reason for Consult: left thigh deep tissue infection    Referring Physician: rizwan  Principal Problem:   Cellulitis and abscess of leg Active Problems:   Stage IV breast cancer in remission   DIABETES MELLITUS, TYPE II   HYPERLIPIDEMIA   Chronic pain syndrome   SLE-on prednisone-Follow at [facility]   Hypokalemia   Cellulitis of left upper arm    HPI: [name] Carr is a 58 y.o. female  with SLE on prednisone, and  a history of recurrent MRSA Cellulitis who presented to the [facility] ED with complaints of fever today to 101, with progressive redness and swelling of her left thigh over the last week. She recently was hospitalized at [facility] in early July for cellulitis on her left shoulder which is still infected despite treatment with antibiotics off and on for the past 3 months. She states that she has difficulty with taking oral antibiotics curing her skin infection, but has taken them as instructed. She recently finished a course of levofloxacin . She underwent an I+D was done at the [facility] and she was placed on IV Vancomycin and cefazolin transferred to [facility] for admission. No cultures have been taken from recent I x D.she underwent repeat imaging of leg that did not show any further deep tissue abscess. Induration area is roughly 7 x 8 cm wide. General surgery do not feel that there is need for further debridement. She remains afebrile. picc line placed in right upper arm   Past Medical History  Diagnosis Date  . PERSONALITY DISORDER   . Chronic pain syndrome     chronic narcotics, dependancy hx - [facility] for pain mgmt  . MIGRAINE HEADACHE   . Lyme disease   . ALLERGIC RHINITIS   . ANEMIA-IRON DEFICIENCY   . HYPERLIPIDEMIA   . GERD   . DIABETES MELLITUS, TYPE II   . DEPRESSION   . SLE (systemic lupus erythematosus)     [facility] - chronic pred  .  Psoriasis   . Addison's disease   . Degenerative joint disease   . DDD (degenerative disc disease)   . Phlebitis after infusion     left ?calf; "S/P phenergan/demerol injection"  . Blood clot in vein 2011; 02/04/11    "right jugular vein;left jugular vein"  . SEIZURE DISORDER     "lots of seizures; due to lupus"  . Anxiety   . Neuropathy     hands, feet, due to diabetes & back problem  . Surgical wound infection 02/15/2011    R shoulder and mastectomy site - related to PICC  . CARCINOMA, BREAST 08/2009 dx    R breast,  mets to liver - resolution s/p chemo, s/p B mastect  . Metastases to the liver   . RA (rheumatoid arthritis)   . Hypothyroidism pt states she has never had hy pothyroidism    Allergies:  Allergies  Allergen Reactions  . Ammonia Other (See Comments)    Ended up on ventilator from ammonia tabs  . Contrast Media [Iodinated Diagnostic Agents] Other (See Comments)    Doesn't breath well.  . Iohexol Other (See Comments)    SOB   . Methotrexate Anaphylaxis  . Midazolam Hcl Anaphylaxis  .  Nalbuphine Other (See Comments)    Can't breathe well  . Infliximab Nausea And Vomiting  . Ketorolac Tromethamine Other (See Comments)    Injectable doesn't work and pill hurts stomach.  . Nsaids Nausea And Vomiting  . Celecoxib Rash   MEDICATIONS: .  ceFAZolin (ANCEF) IV  1 g Intravenous 3 times per day  . Chlorhexidine Gluconate Cloth  6 each Topical Q0600  . clonazePAM  1 mg Oral BID  . docusate sodium  200 mg Oral BID  . DULoxetine  60 mg Oral BID  . enoxaparin (LOVENOX) injection  40 mg Subcutaneous Daily  . furosemide  20 mg Oral Daily  . gabapentin  900 mg Oral TID  . HYDROcodone-acetaminophen  1 tablet Oral Q6H  . insulin aspart  0-9 Units Subcutaneous TID WC  . lactobacillus acidophilus  1 tablet Oral Daily  . mupirocin ointment  1 application Nasal BID  . pantoprazole  40 mg Oral Daily  . potassium chloride SA  20 mEq Oral BID  . predniSONE  7.5 mg Oral Q  breakfast  . rOPINIRole  1 mg Oral QHS  . sodium chloride  3 mL Intravenous Q12H  . vancomycin  1,250 mg Intravenous Q12H    History  Substance Use Topics  . Smoking status: Never Smoker   . Smokeless tobacco: Never Used  . Alcohol Use: No    Family History  Problem Relation Age of Onset  . Lung cancer Mother   . Lung cancer Father   . Arthritis Other   . Diabetes Other   . Hyperlipidemia Other     Review of Systems  Constitutional: Negative for fever, chills, diaphoresis, activity change, appetite change, fatigue and unexpected weight change.  HENT: Negative for congestion, sore throat, rhinorrhea, sneezing, trouble swallowing and sinus pressure.  Eyes: Negative for photophobia and visual disturbance.  Respiratory: Negative for cough, chest tightness, shortness of breath, wheezing and stridor.  Cardiovascular: Negative for chest pain, palpitations and leg swelling.  Gastrointestinal: Negative for nausea, vomiting, abdominal pain, diarrhea, constipation, blood in stool, abdominal distention and anal bleeding.  Genitourinary: Negative for dysuria, hematuria, flank pain and difficulty urinating.  Musculoskeletal: Negative for myalgias, back pain, joint swelling, arthralgias and gait problem.  Skin: + left thigh wound, and associated pain Neurological: Negative for dizziness, tremors, weakness and light-headedness.  Hematological: Negative for adenopathy. Does not bruise/bleed easily.  Psychiatric/Behavioral: Negative for behavioral problems, confusion, sleep disturbance, dysphoric mood, decreased concentration and agitation.     OBJECTIVE: Temp:  [97.5 F (36.4 C)-98.4 F (36.9 C)] 98.4 F (36.9 C) (07/18 1000) Pulse Rate:  [77-83] 77 (07/18 1000) Resp:  [18-22] 22 (07/18 1000) BP: (133-158)/(65-75) 158/75 mmHg (07/18 1000) SpO2:  [91 %-100 %] 91 % (07/18 1000) Weight:  [186 lb 15.2 oz (84.8 kg)] 186 lb 15.2 oz (84.8 kg) (07/17 2008)   Constitutional:  oriented to  person, place, and time. appears well-developed and well-nourished. No distress.  HENT: edentulous, no thrush Mouth/Throat: Oropharynx is clear and moist. No oropharyngeal exudate.  Cardiovascular: Normal rate, regular rhythm and normal heart sounds. Exam reveals no gallop and no friction rub.  No murmur heard.  Pulmonary/Chest: Effort normal and breath sounds normal. No respiratory distress.  has no wheezes.  Abdominal: Soft. Bowel sounds are normal.  exhibits no distension. There is no tenderness.  Lymphadenopathy: no cervical adenopathy.  Neurological: alert and oriented to person, place, and time.  Skin: Skin is warm and dry. Multiple scars on bilateral forearms from prior surgery/sutures.  Left shoulder has shallow ulcer slowly healing no surrounding erythema. Left thigh has 2-3cm incision roughly < 1cm deep good granulation bed with surrounding induration of 6-7 cm and erythema Psychiatric: a normal mood and affect.  behavior is normal.    LABS: Results for orders placed during the hospital encounter of 08/21/13 (from the past 48 hour(s))  GLUCOSE, CAPILLARY     Status: Abnormal   Collection Time    08/22/13  5:14 PM      Result Value Ref Range   Glucose-Capillary 135 (*) 70 - 99 mg/dL  GLUCOSE, CAPILLARY     Status: Abnormal   Collection Time    08/23/13 12:24 AM      Result Value Ref Range   Glucose-Capillary 124 (*) 70 - 99 mg/dL  GLUCOSE, CAPILLARY     Status: Abnormal   Collection Time    08/23/13  4:59 AM      Result Value Ref Range   Glucose-Capillary 118 (*) 70 - 99 mg/dL  HEMOGLOBIN A1C     Status: Abnormal   Collection Time    08/23/13  5:50 AM      Result Value Ref Range   Hemoglobin A1C 6.5 (*) <5.7 %   Comment: (NOTE)                                                                               According to the ADA Clinical Practice Recommendations for 2011, when     HbA1c is used as a screening test:      >=6.5%   Diagnostic of Diabetes Mellitus                (if abnormal result is confirmed)     5.7-6.4%   Increased risk of developing Diabetes Mellitus     References:Diagnosis and Classification of Diabetes Mellitus,Diabetes     HWEX,9371,69(CVELF 1):S62-S69 and Standards of Medical Care in             Diabetes - 2011,Diabetes Care,2011,34 (Suppl 1):S11-S61.   Mean Plasma Glucose 140 (*) <117 mg/dL   Comment: Performed at [place], CAPILLARY     Status: Abnormal   Collection Time    08/23/13  7:49 AM      Result Value Ref Range   Glucose-Capillary 115 (*) 70 - 99 mg/dL  GLUCOSE, CAPILLARY     Status: Abnormal   Collection Time    08/23/13 11:55 AM      Result Value Ref Range   Glucose-Capillary 122 (*) 70 - 99 mg/dL  GLUCOSE, CAPILLARY     Status: Abnormal   Collection Time    08/23/13  4:34 PM      Result Value Ref Range   Glucose-Capillary 101 (*) 70 - 99 mg/dL  GLUCOSE, CAPILLARY     Status: Abnormal   Collection Time    08/23/13  8:22 PM      Result Value Ref Range   Glucose-Capillary 118 (*) 70 - 99 mg/dL  BASIC METABOLIC PANEL     Status: Abnormal   Collection Time    08/24/13  4:43 AM      Result Value Ref Range   Sodium 142  137 - 147 mEq/L   Potassium 3.7  3.7 - 5.3 mEq/L   Chloride 102  96 - 112 mEq/L   CO2 31  19 - 32 mEq/L   Glucose, Bld 118 (*) 70 - 99 mg/dL   BUN 16  6 - 23 mg/dL   Creatinine, Ser 0.74  0.50 - 1.10 mg/dL   Calcium 8.6  8.4 - 10.5 mg/dL   GFR calc non Af Amer >90  >90 mL/min   GFR calc Af Amer >90  >90 mL/min   Comment: (NOTE)     The eGFR has been calculated using the CKD EPI equation.     This calculation has not been validated in all clinical situations.     eGFR's persistently <90 mL/min signify possible Chronic Kidney     Disease.   Anion gap 9  5 - 15  GLUCOSE, CAPILLARY     Status: Abnormal   Collection Time    08/24/13  7:29 AM      Result Value Ref Range   Glucose-Capillary 126 (*) 70 - 99 mg/dL  GLUCOSE, CAPILLARY     Status: Abnormal   Collection Time     08/24/13 12:19 PM      Result Value Ref Range   Glucose-Capillary 103 (*) 70 - 99 mg/dL    MICRO:  IMAGING: Ct Femur Left Wo Contrast  08/23/2013   CLINICAL DATA:  Questionable left thigh mass with superimposed infection status post bedside incision and drainage x2. Evaluate for abscess. History of stage IV breast cancer.  EXAM: CT OF THE LEFT FEMUR WITHOUT CONTRAST  TECHNIQUE: Multi detector CT imaging of the left thigh was performed without contrast. Multiplanar reformatted images were generated.  COMPARISON:  Pelvic CT 02/15/2013, PET-CT 01/15/2013 and hip radiographs 02/12/2009 and 08/09/2008.  FINDINGS: There is a chronic collection of coarse calcifications anterolaterally in the subcutaneous fat of the mid left thigh. These calcifications are radiographically stable from radiographs dating back to 2010. There are similar calcifications in the right thigh and both buttocks. Surrounding the calcifications, there is ill-defined soft tissue stranding and a small amount of gas, the latter attributed to the recent procedure. No drainable fluid collection identified.  No intramuscular inflammatory change, fluid collection or calcification is demonstrated.  The left femur appears normal.  IMPRESSION: 1. Chronic subcutaneous calcifications in the anterior left thigh, similar to those demonstrated in the right thigh and both buttocks. These calcifications are probably related to chronic injection granulomas or the patient's systemic lupus erythematosus. 2. Nonspecific adjacent soft tissue stranding and gas. These findings may be related to the recent procedure or reflect superimposed cellulitis. No drainable abscess identified. 3. No evidence of deep soft tissue infection or osteomyelitis.   Electronically Signed   By: Camie Patience M.D.   On: 08/23/2013 14:57    HISTORICAL MICRO/IMAGING  Assessment/Plan:  58yo F with SLe on steroids who has past history of mrsa skin infection presents with fever,  leukocytosis, and left thightissue infection s/p I x D on cefazolin and vancomycin  - can switch to ceftaroline for now to treat for 2 wks vs can consider long acting one time dose of oritavancin to treat deep tissue infection. We will need to coordinate with pharmacy to order drug, then she would not need to be sent out on IV antibiotics as an outpatient.  Elzie Rings [facility] [phone]

## 2013-08-24 NOTE — Progress Notes (Signed)
BP 120/63 HR 74, O2 80% on ra. Placed patient on 2 L O2 , sats now 93%. Patient sleepy but arousable, face flushed, no rash noted, lungs clear.  Bed alarm on. MD notified.  Will continue to monitor.

## 2013-08-24 NOTE — Progress Notes (Signed)
Subjective: Still having some left thigh pain  Objective: Vital signs in last 24 hours: Temp:  [97.5 F (36.4 C)-98.4 F (36.9 C)] 98.4 F (36.9 C) (07/18 1000) Pulse Rate:  [77-83] 77 (07/18 1000) Resp:  [18-22] 22 (07/18 1000) BP: (133-158)/(65-75) 158/75 mmHg (07/18 1000) SpO2:  [91 %-100 %] 91 % (07/18 1000) Weight:  [186 lb 15.2 oz (84.8 kg)] 186 lb 15.2 oz (84.8 kg) (07/17 2008) Last BM Date: 08/23/13  Intake/Output from previous day: 07/17 0701 - 07/18 0700 In: 1730 [P.O.:1080; IV Piggyback:650] Out: -  Intake/Output this shift: Total I/O In: 240 [P.O.:240] Out: -   PE: General- In NAD Left thigh-wounds are clean with no redness  Lab Results:   Recent Labs  08/21/13 1957 08/22/13 1520  WBC 11.0* 7.9  HGB 12.1 11.2*  HCT 37.2 35.2*  PLT 315 280   BMET  Recent Labs  08/22/13 1520 08/24/13 0443  NA 139 142  K 3.2* 3.7  CL 95* 102  CO2 28 31  GLUCOSE 163* 118*  BUN 17 16  CREATININE 0.79 0.74  CALCIUM 8.9 8.6   PT/INR No results found for this basename: LABPROT, INR,  in the last 72 hours Comprehensive Metabolic Panel:    Component Value Date/Time   NA 142 08/24/2013 0443   NA 139 08/22/2013 1520   NA 135 04/25/2013 1439   NA 135* 04/08/2013 1148   NA 134* 01/15/2013 1010   K 3.7 08/24/2013 0443   K 3.2* 08/22/2013 1520   K 3.5 04/25/2013 1439   K 4.6 04/08/2013 1148   K 2.7* 01/15/2013 1010   CL 102 08/24/2013 0443   CL 95* 08/22/2013 1520   CL 90* 04/25/2013 1439   CL 91* 03/15/2012 1410   CL 96* 12/26/2011 1455   CO2 31 08/24/2013 0443   CO2 28 08/22/2013 1520   CO2 32 04/25/2013 1439   CO2 28 04/08/2013 1148   CO2 28 01/15/2013 1010   BUN 16 08/24/2013 0443   BUN 17 08/22/2013 1520   BUN 26* 04/25/2013 1439   BUN 29.9* 04/08/2013 1148   BUN 23.8 01/15/2013 1010   CREATININE 0.74 08/24/2013 0443   CREATININE 0.79 08/22/2013 1520   CREATININE 0.9 04/25/2013 1439   CREATININE 1.2* 04/08/2013 1148   CREATININE 1.1 01/15/2013 1010   GLUCOSE 118* 08/24/2013  0443   GLUCOSE 163* 08/22/2013 1520   GLUCOSE 201* 04/25/2013 1439   GLUCOSE 268* 04/08/2013 1148   GLUCOSE 125 01/15/2013 1010   GLUCOSE 131* 03/15/2012 1410   GLUCOSE 119* 12/26/2011 1455   CALCIUM 8.6 08/24/2013 0443   CALCIUM 8.9 08/22/2013 1520   CALCIUM 9.9 04/25/2013 1439   CALCIUM 9.6 04/08/2013 1148   CALCIUM 9.9 01/15/2013 1010   AST 21 04/25/2013 1439   AST 17 04/08/2013 1148   AST 15 02/12/2013 0352   AST 19 01/15/2013 1010   AST 24 11/09/2012 0524   ALT 36 04/25/2013 1439   ALT 18 04/08/2013 1148   ALT 25 02/12/2013 0352   ALT 19 01/15/2013 1010   ALT 33 11/09/2012 0524   ALKPHOS 102* 04/25/2013 1439   ALKPHOS 100 04/08/2013 1148   ALKPHOS 139* 02/12/2013 0352   ALKPHOS 97 01/15/2013 1010   ALKPHOS 74 11/09/2012 0524   BILITOT 0.70 04/25/2013 1439   BILITOT 0.34 04/08/2013 1148   BILITOT <0.2* 02/12/2013 0352   BILITOT 0.35 01/15/2013 1010   BILITOT 0.2* 11/09/2012 0524   PROT 8.5* 04/25/2013 1439   PROT 6.9 04/08/2013  1148   PROT 6.7 02/12/2013 0352   PROT 7.2 01/15/2013 1010   PROT 5.9* 11/09/2012 0524   ALBUMIN 3.4* 04/08/2013 1148   ALBUMIN 2.9* 02/12/2013 0352   ALBUMIN 3.5 01/15/2013 1010   ALBUMIN 2.7* 11/09/2012 0524     Studies/Results: Ct Femur Left Wo Contrast  08/23/2013   CLINICAL DATA:  Questionable left thigh mass with superimposed infection status post bedside incision and drainage x2. Evaluate for abscess. History of stage IV breast cancer.  EXAM: CT OF THE LEFT FEMUR WITHOUT CONTRAST  TECHNIQUE: Multi detector CT imaging of the left thigh was performed without contrast. Multiplanar reformatted images were generated.  COMPARISON:  Pelvic CT 02/15/2013, PET-CT 01/15/2013 and hip radiographs 02/12/2009 and 08/09/2008.  FINDINGS: There is a chronic collection of coarse calcifications anterolaterally in the subcutaneous fat of the mid left thigh. These calcifications are radiographically stable from radiographs dating back to 2010. There are similar calcifications in the right thigh and both  buttocks. Surrounding the calcifications, there is ill-defined soft tissue stranding and a small amount of gas, the latter attributed to the recent procedure. No drainable fluid collection identified.  No intramuscular inflammatory change, fluid collection or calcification is demonstrated.  The left femur appears normal.  IMPRESSION: 1. Chronic subcutaneous calcifications in the anterior left thigh, similar to those demonstrated in the right thigh and both buttocks. These calcifications are probably related to chronic injection granulomas or the patient's systemic lupus erythematosus. 2. Nonspecific adjacent soft tissue stranding and gas. These findings may be related to the recent procedure or reflect superimposed cellulitis. No drainable abscess identified. 3. No evidence of deep soft tissue infection or osteomyelitis.   Electronically Signed   By: Camie Patience M.D.   On: 08/23/2013 14:57   Dg Chest Port 1 View  08/22/2013   CLINICAL DATA:  Status post PICC placement  EXAM: PORTABLE CHEST - 1 VIEW  COMPARISON:  07/23/13  FINDINGS: Cardiac shadow is stable. A right-sided PICC line is now seen at the cavoatrial junction. The lungs are clear bilaterally. No acute bony abnormality is seen.  IMPRESSION: PICC line in satisfactory position.   Electronically Signed   By: Inez Catalina M.D.   On: 08/22/2013 11:05    Anti-infectives: Anti-infectives   Start     Dose/Rate Route Frequency Ordered Stop   08/22/13 1545  ceFAZolin (ANCEF) IVPB 1 g/50 mL premix     1 g 100 mL/hr over 30 Minutes Intravenous 3 times per day 08/22/13 1530     08/22/13 0600  vancomycin (VANCOCIN) 1,250 mg in sodium chloride 0.9 % 250 mL IVPB     1,250 mg 166.7 mL/hr over 90 Minutes Intravenous Every 12 hours 08/22/13 0505     08/21/13 2215  vancomycin (VANCOCIN) IVPB 1000 mg/200 mL premix     1,000 mg 200 mL/hr over 60 Minutes Intravenous  Once 08/21/13 2213 08/21/13 2347      Assessment Principal Problem:   Cellulitis and  abscess of leg s/p I & Ds-wounds okay; will take a very long time to heal given her other co-morbidities.  CT negative for mass or undrained abscess; multiple calcifications noted.    LOS: 3 days   Plan: Continue wound care and antibiotics.   Odis Hollingshead 08/24/2013

## 2013-08-24 NOTE — Progress Notes (Signed)
[facility] Progress Note   [name] Carr QQI:[id number] DOB: June 02, 1955 DOA: 08/21/2013 PCP: [name], MD  Brief narrative: This is a 58 year old female with past medical history of SLE, Sjogren's syndrome, breast cancer with liver metastases in remission, diabetes mellitus type 2 and ongoing left upper arm abscesses and cellulitis. She states that she injured her left thigh with some thorns while working out in the yard which became red swollen and painful. She went to [facility] where the abscess was drained and, subsequently, she was sent to [facility] for admission. She received a PICC and was started on vancomycin.  She's had multiple admissions at [facility] for recurrent abscesses of the left upper arm. These admissions are as follows:  5/28-6/1  6/5-6/12  6/15-6/18  6/25-6/29  7/4-7/7  She states that she is intolerant of oral antibiotics and was never discharged from [place] with any IV antibiotics.  This time she decided to come to [facility] and be treated at [facility]   Subjective: Pain in left thigh uncontrolled this AM- it was well controlled all night and she slept through the night.   Assessment/Plan: Principal Problem:   Cellulitis of left leg/ fever - CT reveals underlying granulomas likely related to IM injections- no acute abscess - appreciate surgery and ortho assistance - will continue Vancomycin and Ancef for now- will consult ID for assistance on which antibiotics to d/c with and outpt follow up as she has a history of recurrent hospital admissions at [place]  Active Problems: Cellulitis in left upper arm - looking better each day    Stage IV breast cancer in remission    DIABETES MELLITUS, TYPE II - controlled without medications  Diabetic neuropathy Cont Gabapentin    Chronic pain syndrome - cont Norco at home dose- she did not use it at all last night and slept  comfortably- suspect pain this AM was due to ambulating  - increased Dilaudid for acute pain control- appears to be working    SLE-on prednisone - Follow at [employer] - cont Prednisone   Code Status: Full code Family Communication: none Disposition Plan: home  Consultants: Ortho General surgery  Procedures: 7/15- I and D in [facility] by ER  Antibiotics: Anti-infectives   Start     Dose/Rate Route Frequency Ordered Stop   08/22/13 1545  ceFAZolin (ANCEF) IVPB 1 g/50 mL premix     1 g 100 mL/hr over 30 Minutes Intravenous 3 times per day 08/22/13 1530     08/22/13 0600  vancomycin (VANCOCIN) 1,250 mg in sodium chloride 0.9 % 250 mL IVPB     1,250 mg 166.7 mL/hr over 90 Minutes Intravenous Every 12 hours 08/22/13 0505     08/21/13 2215  vancomycin (VANCOCIN) IVPB 1000 mg/200 mL premix     1,000 mg 200 mL/hr over 60 Minutes Intravenous  Once 08/21/13 2213 08/21/13 2347       DVT prophylaxis: lovenox    Objective: Filed Weights   08/22/13 0038 08/22/13 2037 08/23/13 2008  Weight: 84.8 kg (186 lb 15.2 oz) 84.8 kg (186 lb 15.2 oz) 84.8 kg (186 lb 15.2 oz)    Vitals Filed Vitals:   08/23/13 1000 08/23/13 2008 08/24/13 0544 08/24/13 1000  BP: 124/76 133/65 141/68 158/75  Pulse: 75 83 80 77  Temp: 98.4 F (36.9 C) 97.5 F (36.4 C) 97.7 F (36.5 C) 98.4 F (36.9 C)  TempSrc: Oral Oral Oral Oral  Resp: 18 18 18 22   Height:      Weight:  84.8 kg (186 lb 15.2 oz)    SpO2: 98% 96% 100% 91%      Intake/Output Summary (Last 24 hours) at 08/24/13 1058 Last data filed at 08/24/13 1001  Gross per 24 hour  Intake   1970 ml  Output      0 ml  Net   1970 ml     Exam: General: No acute respiratory distress Lungs: Clear to auscultation bilaterally without wheezes or crackles Cardiovascular: Regular rate and rhythm without murmur gallop or rub normal S1 and S2 Abdomen: Nontender, nondistended, soft, bowel sounds positive, no rebound, no ascites, no appreciable  mass Extremities: No significant cyanosis, clubbing, or edema bilateral lower extremities Skin: ulcer and erythema in left upper arm improving- induration still present. No significant erythema or edema in left thigh - packing present. Continues to feel indurated  Data Reviewed: Basic Metabolic Panel:  Recent Labs Lab 08/21/13 1957 08/22/13 1520 08/24/13 0443  NA 137 139 142  K 3.4* 3.2* 3.7  CL 93* 95* 102  CO2 27 28 31   GLUCOSE 111* 163* 118*  BUN 14 17 16   CREATININE 0.90 0.79 0.74  CALCIUM 9.6 8.9 8.6   Liver Function Tests: No results found for this basename: AST, ALT, ALKPHOS, BILITOT, PROT, ALBUMIN,  in the last 168 hours No results found for this basename: LIPASE, AMYLASE,  in the last 168 hours No results found for this basename: AMMONIA,  in the last 168 hours CBC:  Recent Labs Lab 08/21/13 1957 08/22/13 1520  WBC 11.0* 7.9  HGB 12.1 11.2*  HCT 37.2 35.2*  MCV 92.3 91.9  PLT 315 280   Cardiac Enzymes: No results found for this basename: CKTOTAL, CKMB, CKMBINDEX, TROPONINI,  in the last 168 hours BNP (last 3 results)  Recent Labs  11/11/12 0500  PROBNP 462.1*   CBG:  Recent Labs Lab 08/23/13 0749 08/23/13 1155 08/23/13 1634 08/23/13 2022 08/24/13 0729  GLUCAP 115* 122* 101* 118* 126*    Recent Results (from the past 240 hour(s))  MRSA PCR SCREENING     Status: Abnormal   Collection Time    08/22/13  2:36 AM      Result Value Ref Range Status   MRSA by PCR POSITIVE (*) NEGATIVE Final   Comment:            The GeneXpert MRSA Assay (FDA     approved for NASAL specimens     only), is one component of a     comprehensive MRSA colonization     surveillance program. It is not     intended to diagnose MRSA     infection nor to guide or     monitor treatment for     MRSA infections.     RESULT CALLED TO, READ BACK BY AND VERIFIED WITHPollie Meyer RN [id number] AT 0421 SKEEN,P     Studies:  Recent x-ray studies have been reviewed in detail  by the Attending Physician  Scheduled Meds:  Scheduled Meds: .  ceFAZolin (ANCEF) IV  1 g Intravenous 3 times per day  . Chlorhexidine Gluconate Cloth  6 each Topical Q0600  . clonazePAM  1 mg Oral BID  . docusate sodium  200 mg Oral BID  . DULoxetine  60 mg Oral BID  . enoxaparin (LOVENOX) injection  40 mg Subcutaneous Daily  . furosemide  20 mg Oral Daily  . gabapentin  900 mg Oral TID  . HYDROcodone-acetaminophen  1 tablet Oral Q6H  . insulin aspart  0-9 Units Subcutaneous TID WC  . lactobacillus acidophilus  1 tablet Oral Daily  . mupirocin ointment  1 application Nasal BID  . pantoprazole  40 mg Oral Daily  . potassium chloride SA  20 mEq Oral BID  . predniSONE  7.5 mg Oral Q breakfast  . rOPINIRole  1 mg Oral QHS  . sodium chloride  3 mL Intravenous Q12H  . vancomycin  1,250 mg Intravenous Q12H   Continuous Infusions:   Time spent on care of this patient: 35 min   [place], MD 08/24/2013, 10:58 AM  LOS: 3 days   [facility] Office  [phone] Pager - Text Page per Shea Evans   If 7PM-7AM, please contact night-coverage [email or web address]

## 2013-08-25 LAB — GLUCOSE, CAPILLARY
GLUCOSE-CAPILLARY: 69 mg/dL — AB (ref 70–99)
Glucose-Capillary: 67 mg/dL — ABNORMAL LOW (ref 70–99)
Glucose-Capillary: 120 mg/dL — ABNORMAL HIGH (ref 70–99)
Glucose-Capillary: 125 mg/dL — ABNORMAL HIGH (ref 70–99)

## 2013-08-25 MED ORDER — METOCLOPRAMIDE HCL 5 MG/ML IJ SOLN
10.0000 mg | Freq: Once | INTRAMUSCULAR | Status: AC
  Administered 2013-08-25: 10 mg via INTRAVENOUS
  Filled 2013-08-25: qty 2

## 2013-08-25 MED ORDER — HYDROMORPHONE HCL PF 1 MG/ML IJ SOLN
0.5000 mg | Freq: Once | INTRAMUSCULAR | Status: AC
  Administered 2013-08-25: 0.5 mg via INTRAVENOUS
  Filled 2013-08-25: qty 1

## 2013-08-25 NOTE — Progress Notes (Addendum)
[facility] PROGRESS NOTE  [name] Carr TIW:[id number] DOB: [birth year] DOA: 08/21/2013 PCP: [name], MD  Assessment/Plan: Principal Problem:   Cellulitis and abscess of leg Active Problems:   Stage IV breast cancer in remission   DIABETES MELLITUS, TYPE II   HYPERLIPIDEMIA   Chronic pain syndrome   SLE-on prednisone-Follow at [facility]   Hypokalemia   Cellulitis of left upper arm       Brief narrative: [name] Carr is a 58 y.o. female with SLE on prednisone, and a history of recurrent MRSA Cellulitis who presented to the [facility] ED with complaints of fever today to 101, with progressive redness and swelling of her left thigh over the last week. She recently was hospitalized at [facility] in early July for cellulitis on her left shoulder which is still infected despite treatment with antibiotics off and on for the past 3 months. She states that she has difficulty with taking oral antibiotics curing her skin infection, but has taken them as instructed. She recently finished a course of levofloxacin . She underwent an I+D was done at the [facility] and she was placed on IV Vancomycin and cefazolin transferred to [facility] for admission. No cultures have been taken from recent I x D.she underwent repeat imaging of leg that did not show any further deep tissue abscess. Induration area is roughly 7 x 8 cm wide. General surgery do not feel that there is need for further debridement. She remains afebrile. picc line placed in right upper arm  She's had multiple admissions at [facility] for recurrent abscesses of the left upper arm. These admissions are as follows:  5/28-6/1  6/5-6/12  6/15-6/18  6/25-6/29  7/4-7/7  She states that she is intolerant of oral antibiotics and was never discharged from [place] with any IV antibiotics.  This time she decided to come to [facility] and be treated at [facility]   Consultants:   infectious disease  Subjective:   Pain in left thigh uncontrolled this AM- it was well controlled all night and she slept through the night.  Assessment/Plan:  Principal Problem:  Cellulitis of left leg/ fever  - CT reveals underlying granulomas likely related to IM injections- no acute abscess  - appreciate surgery, infectious disease and ortho assistance  left thightissue infection s/p I x D on cefazolin and vancomycin ceftaroline for now to treat for 2 wks     Cellulitis in left upper arm  Improved  Stage IV breast cancer in remission  DIABETES MELLITUS, TYPE II  - controlled without medications , continue sliding scale insulin Hemoglobin A1c 6.5  Diabetic neuropathy  Cont Gabapentin   Chronic pain syndrome  - cont Norco at home dose- she did not use it at all last night and slept comfortably- suspect pain this AM was due to ambulating  - increased Dilaudid for acute pain control- appears to be working   Hypokalemia Resolved  SLE-on prednisone  - Follow at [place] Prednisone  Code Status: Full code Family Communication: none  Disposition Plan: Home with possibly no IV antibiotics per infectious disease  Consultants:  Ortho  General surgery  Procedures:  7/15- I and D in [facility] by ER     Objective: Filed Vitals:   08/24/13 2356 08/24/13 2358 08/25/13 0014 08/25/13 0520  BP: 118/68   153/86  Pulse: 73   64  Temp: 97.6 F (36.4 C)   97.6 F (36.4 C)  TempSrc:  Axillary   Oral  Resp: 20   18  Height:      Weight:      SpO2: 87% 98% 97% 100%    Intake/Output Summary (Last 24 hours) at 08/25/13 0944 Last data filed at 08/24/13 1001  Gross per 24 hour  Intake    240 ml  Output      0 ml  Net    240 ml    Exam:  Skin is warm and dry. Multiple scars on bilateral forearms from prior surgery/sutures. Left shoulder has shallow ulcer slowly healing no surrounding erythema. Left thigh has 2-3cm incision roughly < 1cm deep good granulation bed with surrounding induration of  6-7 cm and erythema Psychiatric: a normal mood and affect. behavior is normal  Eyes: Conjunctivae are normal. Pupils are equal, round, and reactive to light. No scleral icterus.  Neck: Neck supple. No tracheal deviation present.  Cardiovascular: Normal rate, regular rhythm, normal heart sounds and intact distal pulses.  Pulmonary/Chest: Effort normal and breath sounds normal. No respiratory distress.  Abdominal: Soft. Normal appearance and bowel sounds are normal. She exhibits no distension. There is no tenderness.  Musculoskeletal: She exhibits no edema and no tenderness.  Neurological: She is alert. No cranial nerve deficit.    Data Reviewed: Basic Metabolic Panel:  Recent Labs Lab 08/21/13 1957 08/22/13 1520 08/24/13 0443  NA 137 139 142  K 3.4* 3.2* 3.7  CL 93* 95* 102  CO2 27 28 31   GLUCOSE 111* 163* 118*  BUN 14 17 16   CREATININE 0.90 0.79 0.74  CALCIUM 9.6 8.9 8.6    Liver Function Tests: No results found for this basename: AST, ALT, ALKPHOS, BILITOT, PROT, ALBUMIN,  in the last 168 hours No results found for this basename: LIPASE, AMYLASE,  in the last 168 hours No results found for this basename: AMMONIA,  in the last 168 hours  CBC:  Recent Labs Lab 08/21/13 1957 08/22/13 1520  WBC 11.0* 7.9  HGB 12.1 11.2*  HCT 37.2 35.2*  MCV 92.3 91.9  PLT 315 280    Cardiac Enzymes: No results found for this basename: CKTOTAL, CKMB, CKMBINDEX, TROPONINI,  in the last 168 hours BNP (last 3 results)  Recent Labs  11/11/12 0500  PROBNP 462.1*     CBG:  Recent Labs Lab 08/24/13 0729 08/24/13 1219 08/24/13 1636 08/24/13 2102 08/25/13 0747  GLUCAP 126* 103* 125* 139* 67*    Recent Results (from the past 240 hour(s))  MRSA PCR SCREENING     Status: Abnormal   Collection Time    08/22/13  2:36 AM      Result Value Ref Range Status   MRSA by PCR POSITIVE (*) NEGATIVE Final   Comment:            The GeneXpert MRSA Assay (FDA     approved for NASAL  specimens     only), is one component of a     comprehensive MRSA colonization     surveillance program. It is not     intended to diagnose MRSA     infection nor to guide or     monitor treatment for     MRSA infections.     RESULT CALLED TO, READ BACK BY AND VERIFIED WITHPollie Meyer RN [id number] AT 0421 SKEEN,P     Studies: Ct Femur Left Wo Contrast  08/23/2013   CLINICAL DATA:  Questionable left thigh mass with superimposed infection status post bedside incision and  drainage x2. Evaluate for abscess. History of stage IV breast cancer.  EXAM: CT OF THE LEFT FEMUR WITHOUT CONTRAST  TECHNIQUE: Multi detector CT imaging of the left thigh was performed without contrast. Multiplanar reformatted images were generated.  COMPARISON:  Pelvic CT 02/15/2013, PET-CT 01/15/2013 and hip radiographs 02/12/2009 and 08/09/2008.  FINDINGS: There is a chronic collection of coarse calcifications anterolaterally in the subcutaneous fat of the mid left thigh. These calcifications are radiographically stable from radiographs dating back to 2010. There are similar calcifications in the right thigh and both buttocks. Surrounding the calcifications, there is ill-defined soft tissue stranding and a small amount of gas, the latter attributed to the recent procedure. No drainable fluid collection identified.  No intramuscular inflammatory change, fluid collection or calcification is demonstrated.  The left femur appears normal.  IMPRESSION: 1. Chronic subcutaneous calcifications in the anterior left thigh, similar to those demonstrated in the right thigh and both buttocks. These calcifications are probably related to chronic injection granulomas or the patient's systemic lupus erythematosus. 2. Nonspecific adjacent soft tissue stranding and gas. These findings may be related to the recent procedure or reflect superimposed cellulitis. No drainable abscess identified. 3. No evidence of deep soft tissue infection or osteomyelitis.    Electronically Signed   By: Camie Patience M.D.   On: 08/23/2013 14:57   Dg Chest Port 1 View  08/22/2013   CLINICAL DATA:  Status post PICC placement  EXAM: PORTABLE CHEST - 1 VIEW  COMPARISON:  07/23/13  FINDINGS: Cardiac shadow is stable. A right-sided PICC line is now seen at the cavoatrial junction. The lungs are clear bilaterally. No acute bony abnormality is seen.  IMPRESSION: PICC line in satisfactory position.   Electronically Signed   By: Inez Catalina M.D.   On: 08/22/2013 11:05    Scheduled Meds: . ceFTAROline (TEFLARO) IV  600 mg Intravenous Q12H  . Chlorhexidine Gluconate Cloth  6 each Topical Q0600  . clonazePAM  1 mg Oral BID  . docusate sodium  200 mg Oral BID  . DULoxetine  60 mg Oral BID  . enoxaparin (LOVENOX) injection  40 mg Subcutaneous Daily  . furosemide  20 mg Oral Daily  . gabapentin  900 mg Oral TID  . HYDROcodone-acetaminophen  1 tablet Oral Q6H  . insulin aspart  0-9 Units Subcutaneous TID WC  . lactobacillus acidophilus  1 tablet Oral Daily  . mupirocin ointment  1 application Nasal BID  . pantoprazole  40 mg Oral Daily  . potassium chloride SA  20 mEq Oral BID  . predniSONE  7.5 mg Oral Q breakfast  . rOPINIRole  1 mg Oral QHS  . sodium chloride  3 mL Intravenous Q12H   Continuous Infusions:   Principal Problem:   Cellulitis and abscess of leg Active Problems:   Stage IV breast cancer in remission   DIABETES MELLITUS, TYPE II   HYPERLIPIDEMIA   Chronic pain syndrome   SLE-on prednisone-Follow at [facility]   Hypokalemia   Cellulitis of left upper arm    Time spent: 40 minutes   [place] [facility] Pager [phone]. If 8PM-8AM, please contact night-coverage at [email or web address], password [facility] 08/25/2013, 9:44 AM  LOS: 4 days

## 2013-08-26 DIAGNOSIS — G894 Chronic pain syndrome: Secondary | ICD-10-CM

## 2013-08-26 LAB — COMPREHENSIVE METABOLIC PANEL
ALT: 8 U/L (ref 0–35)
AST: 14 U/L (ref 0–37)
Albumin: 3 g/dL — ABNORMAL LOW (ref 3.5–5.2)
Alkaline Phosphatase: 85 U/L (ref 39–117)
Anion gap: 10 (ref 5–15)
BUN: 16 mg/dL (ref 6–23)
CALCIUM: 8.5 mg/dL (ref 8.4–10.5)
CO2: 31 meq/L (ref 19–32)
CREATININE: 0.69 mg/dL (ref 0.50–1.10)
Chloride: 102 mEq/L (ref 96–112)
GFR calc Af Amer: >90 mL/min (ref >90)
GLUCOSE: 87 mg/dL (ref 70–99)
Potassium: 3.9 mEq/L (ref 3.7–5.3)
Sodium: 143 mEq/L (ref 137–147)
Total Bilirubin: <0.2 mg/dL — ABNORMAL LOW (ref 0.3–1.2)
Total Protein: 6.1 g/dL (ref 6.0–8.3)

## 2013-08-26 LAB — CBC
HCT: 33.4 % — ABNORMAL LOW (ref 36.0–46.0)
HEMOGLOBIN: 10.3 g/dL — AB (ref 12.0–15.0)
MCH: 29.7 pg (ref 26.0–34.0)
MCHC: 30.8 g/dL (ref 30.0–36.0)
MCV: 96.3 fL (ref 78.0–100.0)
Platelets: 171 10*3/uL (ref 150–400)
RBC: 3.47 MIL/uL — AB (ref 3.87–5.11)
RDW: 14.6 % (ref 11.5–15.5)
WBC: 8.1 10*3/uL (ref 4.0–10.5)

## 2013-08-26 LAB — GLUCOSE, CAPILLARY
GLUCOSE-CAPILLARY: 123 mg/dL — AB (ref 70–99)
Glucose-Capillary: 111 mg/dL — ABNORMAL HIGH (ref 70–99)
Glucose-Capillary: 94 mg/dL (ref 70–99)
Glucose-Capillary: 97 mg/dL (ref 70–99)

## 2013-08-26 MED ORDER — HYDROMORPHONE HCL PF 1 MG/ML IJ SOLN
1.0000 mg | INTRAMUSCULAR | Status: DC | PRN
  Administered 2013-08-26: 1 mg via INTRAVENOUS
  Administered 2013-08-26 – 2013-08-27 (×2): 2 mg via INTRAVENOUS
  Administered 2013-08-27 (×2): 1 mg via INTRAVENOUS
  Administered 2013-08-27: 2 mg via INTRAVENOUS
  Administered 2013-08-27: 1 mg via INTRAVENOUS
  Filled 2013-08-26: qty 1
  Filled 2013-08-26 (×2): qty 2
  Filled 2013-08-26: qty 1
  Filled 2013-08-26: qty 2
  Filled 2013-08-26: qty 1
  Filled 2013-08-26 (×2): qty 2

## 2013-08-26 MED ORDER — NALOXONE HCL 0.4 MG/ML IJ SOLN: 0.4000 mg | INTRAMUSCULAR | Status: DC | PRN

## 2013-08-26 MED ORDER — CEFTAROLINE FOSAMIL 600 MG IV SOLR
600.0000 mg | Freq: Two times a day (BID) | INTRAVENOUS | Status: DC
Start: 2013-08-26 — End: 2013-08-30

## 2013-08-26 MED ORDER — HYDROCODONE-ACETAMINOPHEN 10-325 MG PO TABS
2.0000 | ORAL_TABLET | Freq: Four times a day (QID) | ORAL | Status: DC
  Administered 2013-08-26 – 2013-08-27 (×5): 2 via ORAL
  Filled 2013-08-26 (×5): qty 2

## 2013-08-26 MED ORDER — ONDANSETRON HCL 4 MG/2ML IJ SOLN
4.0000 mg | Freq: Four times a day (QID) | INTRAMUSCULAR | Status: DC | PRN
  Administered 2013-08-26: 4 mg via INTRAVENOUS

## 2013-08-26 NOTE — Clinical Social Work Placement (Addendum)
Clinical Social Work Department CLINICAL SOCIAL WORK PLACEMENT NOTE 08/26/2013  Patient:  [name] Carr, [name] Carr  Account Number:  [id number] Admit date:  08/21/2013  Clinical Social Worker:  Davionte Lusby Givens, LCSW  Date/time:  08/26/2013 04:18 AM  Clinical Social Work is seeking post-discharge placement for this patient at the following level of care:   SKILLED NURSING   (*CSW will update this form in Epic as items are completed)   08/26/2013  Patient/family provided with [place] Department of Clinical Social Work's list of facilities offering this level of care within the geographic area requested by the patient (or if unable, by the patient's family).  08/26/2013  Patient/family informed of their freedom to choose among providers that offer the needed level of care, that participate in Medicare, Medicaid or managed care program needed by the patient, have an available bed and are willing to accept the patient.    Patient/family informed of [facility]' ownership interest in [facility], as well as of the fact that they are under no obligation to receive care at this facility.  PASARR submitted to EDS on 03/12/2009 PASARR number received on 03/12/2009  FL2 transmitted to all facilities in geographic area requested by pt/family on  08/26/2013 FL2 transmitted to all facilities within larger geographic area on   Patient informed that his/her managed care company has contracts with or will negotiate with  certain facilities, including the following:     Patient/family informed of bed offers received: 08/27/13   Patient chooses bed at [place] recommends and patient chooses bed at    Patient to be transferred to [facility] on 08/27/13   Patient to be transferred to facility by vehicle (friend - Aline August) Patient and family notified of transfer on 08/27/13 Name of family member notified: None - patient has no family in the area. [place]  The following physician request were entered in Epic:  Additional Comments:

## 2013-08-26 NOTE — Progress Notes (Signed)
Premedicated pt for dressing change. During dressing change pt states that her leg "is numb, and she can't really feel it." However, pt becomes distressed when discussing decreasing amount and frequency of IV pain medication. Pt is having difficulty with train of thought during conversations, however quickly realizes that she is "talking out of her head" and reorients herself. Will continue to encourage PO pain vs IV pain medication. Will continue to monitor.

## 2013-08-26 NOTE — Progress Notes (Signed)
Pt would like to see physical therapist prior to discharge. Pt also feels that "needs to receive pain pills every 4 hours, like the doctor said he would do." Paged MD. Pt feels her "pain is not controlled." Will continue to monitor.

## 2013-08-26 NOTE — Progress Notes (Signed)
Please discharge patient on teflaro for 2 wk after discharge. We will see her back in the ID clinic to see if she needs further oral antibiotics.  Please have home health check weekly bmp while on antibiotics and send results to [facility] clinic, fax [phone].  Elzie Rings [place] [facility] [phone]

## 2013-08-26 NOTE — Progress Notes (Signed)
Subjective: Says she is having nausea and pain from site.  It sounds like a chronic pain issue. She is also concerned about going home because she lives alone.  The open sites look fine.  There is no cellulitis, or fluctuance.  The boggy feeling is calcifications most likely from SLE.  Objective: Vital signs in last 24 hours: Temp:  [98.2 F (36.8 C)-98.6 F (37 C)] 98.5 F (36.9 C) (07/20 0802) Pulse Rate:  [63-81] 69 (07/20 0802) Resp:  [19-22] 22 (07/20 0802) BP: (124-157)/(49-81) 157/49 mmHg (07/20 0802) SpO2:  [94 %-100 %] 100 % (07/20 0802) Last BM Date: 08/26/13 I/O not recorded Afebrile, BP up on and off yesterday Labs are fine CT last week showed:  1. Chronic subcutaneous calcifications in the anterior left thigh, similar to those demonstrated in the right thigh and both buttocks. These calcifications are probably related to chronic injection granulomas or the patient's systemic lupus erythematosus. 2. Nonspecific adjacent soft tissue stranding and gas. These findings may be related to the recent procedure or reflect superimposed cellulitis. No drainable abscess identified. 3. No evidence of deep soft tissue infection or osteomyelitis.  Intake/Output from previous day: 07/19 0701 - 07/20 0700 In: -  Out: 200 [Blood:200] Intake/Output this shift:    General appearance: alert and she is complaining about her pain, she cannot bear weight to walk and when you tell her the site looks fine she continues to search for a new way to prolong visit and care needs. Skin: 2 open sites are clean, packed, no drainage.  no cellulitis  Lab Results:   Recent Labs  08/26/13 0430  WBC 8.1  HGB 10.3*  HCT 33.4*  PLT 171    BMET  Recent Labs  08/24/13 0443 08/26/13 0430  NA 142 143  K 3.7 3.9  CL 102 102  CO2 31 31  GLUCOSE 118* 87  BUN 16 16  CREATININE 0.74 0.69  CALCIUM 8.6 8.5   PT/INR No results found for this basename: LABPROT, INR,  in the last 72  hours   Recent Labs Lab 08/26/13 0430  AST 14  ALT 8  ALKPHOS 85  BILITOT <0.2*  PROT 6.1  ALBUMIN 3.0*     Lipase     Component Value Date/Time   LIPASE 25 08/09/2010 1749     Studies/Results: No results found.  Medications: . ceFTAROline (TEFLARO) IV  600 mg Intravenous Q12H  . Chlorhexidine Gluconate Cloth  6 each Topical Q0600  . clonazePAM  1 mg Oral BID  . docusate sodium  200 mg Oral BID  . DULoxetine  60 mg Oral BID  . enoxaparin (LOVENOX) injection  40 mg Subcutaneous Daily  . furosemide  20 mg Oral Daily  . gabapentin  900 mg Oral TID  . HYDROcodone-acetaminophen  2 tablet Oral Q6H  . insulin aspart  0-9 Units Subcutaneous TID WC  . lactobacillus acidophilus  1 tablet Oral Daily  . mupirocin ointment  1 application Nasal BID  . pantoprazole  40 mg Oral Daily  . potassium chloride SA  20 mEq Oral BID  . predniSONE  7.5 mg Oral Q breakfast  . rOPINIRole  1 mg Oral QHS  . sodium chloride  3 mL Intravenous Q12H    Assessment/Plan 1. Recurrent abscess left thigh  2. SLE on steroids 30 years  3. AODM  4. BREAST CA WITH LIVER METS, Chemotherapy/ in remission  5. Hx of seizures  6. Chronic back pain and narcotic dependence  7. Hx  of personality disorder, anxiety, depression  8. Hx of lyme disease  9. GERD  10. Addison's disease  11. RA   Plan:  I don't think we have anything to offer at this point. She can shower this area and get soap water into it.  The loose packing to wick to the base.  I realize she has a PICC line but from our standpoint local wound care is all that is needed.  LOS: 5 days    Delois Silvester 08/26/2013

## 2013-08-26 NOTE — Progress Notes (Signed)
I have seen and examined the pt and agree with PA-Jenning's progress note.  

## 2013-08-26 NOTE — Progress Notes (Signed)
[facility] Progress Note   [name] Carr XTG:[id number] DOB: [birth year] DOA: 08/21/2013 PCP: [name], MD  Brief narrative: This is a 57 year old female with past medical history of SLE, Sjogren's syndrome, breast cancer with liver metastases in remission, diabetes mellitus type 2 and ongoing left upper arm abscesses and cellulitis. She states that she injured her left thigh with some thorns while working out in the yard which became red swollen and painful. She went to [place] [facility] where the abscess was drained and, subsequently, she was sent to [facility] for admission. She received a PICC and was started on vancomycin.  She's had multiple admissions at [facility] for recurrent abscesses of the left upper arm. These admissions are as follows:  5/28-6/1  6/5-6/12  6/15-6/18  6/25-6/29  7/4-7/7  She states that she is intolerant of oral antibiotics and was never discharged from [place] with any IV antibiotics.  This time she decided to come to [facility] and be treated at [facility]   Subjective: Pain in left thigh continues - noted that she has not been taking the Vicodin that was ordered and has only received Dilaudid for the past 3 days.   Assessment/Plan: Principal Problem:   Cellulitis of left leg/ fever - CT reveals underlying granulomas likely related to IM injections- no acute abscess - appreciate surgery and ortho assistance - ID advises Teflaro for 2 wks and f/u in their clinic- will order Home health- she has a PICC line - increase Vicodin from 1 to 2 tabs Q6hrs rountinely for pain control  Active Problems: Cellulitis in left upper arm - looking better each day    Stage IV breast cancer in remission    DIABETES MELLITUS, TYPE II - controlled without medications  Diabetic neuropathy Cont Gabapentin    Chronic pain syndrome - cont Norco at home - increased as mentioned above    SLE-on  prednisone - Follow at [place] [facility]   Code Status: Full code Family Communication: none Disposition Plan: home  Consultants: Ortho General surgery  Procedures: 7/15- I and D in [place] [facility] by ER  Antibiotics: Anti-infectives   Start     Dose/Rate Route Frequency Ordered Stop   08/24/13 2200  ceftaroline (TEFLARO) 600 mg in sodium chloride 0.9 % 250 mL IVPB     600 mg 250 mL/hr over 60 Minutes Intravenous Every 12 hours 08/24/13 1654     08/22/13 1545  ceFAZolin (ANCEF) IVPB 1 g/50 mL premix  Status:  Discontinued     1 g 100 mL/hr over 30 Minutes Intravenous 3 times per day 08/22/13 1530 08/24/13 1654   08/22/13 0600  vancomycin (VANCOCIN) 1,250 mg in sodium chloride 0.9 % 250 mL IVPB  Status:  Discontinued     1,250 mg 166.7 mL/hr over 90 Minutes Intravenous Every 12 hours 08/22/13 0505 08/24/13 1654   08/21/13 2215  vancomycin (VANCOCIN) IVPB 1000 mg/200 mL premix     1,000 mg 200 mL/hr over 60 Minutes Intravenous  Once 08/21/13 2213 08/21/13 2347       DVT prophylaxis: lovenox    Objective: Filed Weights   08/22/13 0038 08/22/13 2037 08/23/13 2008  Weight: 84.8 kg (186 lb 15.2 oz) 84.8 kg (186 lb 15.2 oz) 84.8 kg (186 lb 15.2 oz)    Vitals Filed Vitals:   08/25/13 2149 08/26/13 0548 08/26/13 0802 08/26/13 1144  BP: 150/79 124/81 157/49 147/69  Pulse: 63 75 69 61  Temp: 98.6 F (37 C) 98.2 F (36.8 C) 98.5 F (36.9 C) 99.2 F (37.3 C)  TempSrc: Oral Oral Oral Oral  Resp: 19 20 22 20   Height:      Weight:      SpO2: 100% 94% 100% 100%      Intake/Output Summary (Last 24 hours) at 08/26/13 1236 Last data filed at 08/26/13 0900  Gross per 24 hour  Intake    240 ml  Output    200 ml  Net     40 ml     Exam: General: No acute respiratory distress Lungs: Clear to auscultation bilaterally without wheezes or crackles Cardiovascular: Regular rate and rhythm without murmur gallop or rub normal S1 and S2 Abdomen: Nontender,  nondistended, soft, bowel sounds positive, no rebound, no ascites, no appreciable mass Extremities: No significant cyanosis, clubbing, or edema bilateral lower extremities Skin: ulcer and erythema in left upper arm improving- induration still present. No significant erythema or edema in left thigh - packing present. Continues to feel indurated  Data Reviewed: Basic Metabolic Panel:  Recent Labs Lab 08/21/13 1957 08/22/13 1520 08/24/13 0443 08/26/13 0430  NA 137 139 142 143  K 3.4* 3.2* 3.7 3.9  CL 93* 95* 102 102  CO2 27 28 31 31   GLUCOSE 111* 163* 118* 87  BUN 14 17 16 16   CREATININE 0.90 0.79 0.74 0.69  CALCIUM 9.6 8.9 8.6 8.5   Liver Function Tests:  Recent Labs Lab 08/26/13 0430  AST 14  ALT 8  ALKPHOS 85  BILITOT <0.2*  PROT 6.1  ALBUMIN 3.0*   No results found for this basename: LIPASE, AMYLASE,  in the last 168 hours No results found for this basename: AMMONIA,  in the last 168 hours CBC:  Recent Labs Lab 08/21/13 1957 08/22/13 1520 08/26/13 0430  WBC 11.0* 7.9 8.1  HGB 12.1 11.2* 10.3*  HCT 37.2 35.2* 33.4*  MCV 92.3 91.9 96.3  PLT 315 280 171   Cardiac Enzymes: No results found for this basename: CKTOTAL, CKMB, CKMBINDEX, TROPONINI,  in the last 168 hours BNP (last 3 results)  Recent Labs  11/11/12 0500  PROBNP 462.1*   CBG:  Recent Labs Lab 08/25/13 1626 08/25/13 2145 08/26/13 0004 08/26/13 0756 08/26/13 1140  GLUCAP 125* 69* 111* 94 97    Recent Results (from the past 240 hour(s))  MRSA PCR SCREENING     Status: Abnormal   Collection Time    08/22/13  2:36 AM      Result Value Ref Range Status   MRSA by PCR POSITIVE (*) NEGATIVE Final   Comment:            The GeneXpert MRSA Assay (FDA     approved for NASAL specimens     only), is one component of a     comprehensive MRSA colonization     surveillance program. It is not     intended to diagnose MRSA     infection nor to guide or     monitor treatment for     MRSA  infections.     RESULT CALLED TO, READ BACK BY AND VERIFIED WITHPollie Meyer RN [id number] AT 0421 SKEEN,P     Studies:  Recent x-ray studies have been reviewed in detail by the Attending Physician  Scheduled Meds:  Scheduled Meds: . ceFTAROline (TEFLARO) IV  600 mg Intravenous Q12H  . Chlorhexidine Gluconate Cloth  6 each Topical Q0600  . clonazePAM  1  mg Oral BID  . docusate sodium  200 mg Oral BID  . DULoxetine  60 mg Oral BID  . enoxaparin (LOVENOX) injection  40 mg Subcutaneous Daily  . furosemide  20 mg Oral Daily  . gabapentin  900 mg Oral TID  . HYDROcodone-acetaminophen  2 tablet Oral Q6H  . insulin aspart  0-9 Units Subcutaneous TID WC  . lactobacillus acidophilus  1 tablet Oral Daily  . mupirocin ointment  1 application Nasal BID  . pantoprazole  40 mg Oral Daily  . potassium chloride SA  20 mEq Oral BID  . predniSONE  7.5 mg Oral Q breakfast  . rOPINIRole  1 mg Oral QHS  . sodium chloride  3 mL Intravenous Q12H   Continuous Infusions:   Time spent on care of this patient: 35 min   Daniels, MD 08/26/2013, 12:36 PM  LOS: 5 days   [facility] Office  [phone] Pager - Text Page per Shea Evans   If 7PM-7AM, please contact night-coverage [email or web address]

## 2013-08-26 NOTE — Care Management Note (Signed)
CARE MANAGEMENT NOTE 08/26/2013  Patient:  [name] Carr, [name] Carr   Account Number:  [id number]  Date Initiated:  08/26/2013  Documentation initiated by:  Graceland Wachter  Subjective/Objective Assessment:   Order for [facility] and IV antibiotic.     Action/Plan:   Met with pt re HHRN and IV antibiotics, pt is familiar with HH and adm of IV antibiotics, however pt is interested in short term SNF to receive the IV meds and daily PT.   Anticipated DC Date:  08/28/2013   Anticipated DC Plan:    In-house referral  Clinical Social Worker         Choice offered to / List presented to:             Status of service:  In process, will continue to follow Medicare Important Message given?   (If response is "NO", the following Medicare IM given date fields will be blank) Date Medicare IM given:   Medicare IM given by:   Date Additional Medicare IM given:   Additional Medicare IM given by:    Discharge Disposition:    Per UR Regulation:    If discussed at Long Length of Stay Meetings, dates discussed:    Comments:

## 2013-08-26 NOTE — Clinical Social Work Psychosocial (Signed)
Clinical Social Work Department BRIEF PSYCHOSOCIAL ASSESSMENT 08/26/2013  Patient:  [name] Carr, [name] Carr     Account Number:  [id number]     Admit date:  08/21/2013  Clinical Social Worker:  Frederico Hamman  Date/Time:  08/26/2013 04:08 AM  Referred by:  Care Management  Date Referred:  08/26/2013 Referred for  SNF Placement   Other Referral:   Interview type:  Patient Other interview type:    PSYCHOSOCIAL DATA Living Status:  ALONE Admitted from facility:   Level of care:   Primary support name:   Primary support relationship to patient:   Degree of support available:   Rockne Menghini and Aline August are listed as patient's friends. No family listed or mentioned by patient.    CURRENT CONCERNS Current Concerns  Post-Acute Placement   Other Concerns:    SOCIAL WORK ASSESSMENT / PLAN CSW talked with patient after being advised by nurse case manager that patient brought up the option of a skilled for facility (extended care) for administration of her IV antibiotic and rehab, instead of going home. Patient reported that she has been to a skilled facility earlier this year, but does not want to return to this facility. CSW provided patient with a list of skilled facilities in [facility] and explained SNF search process and patient expressed understanding.    CSW contacted prior facility [facility]) and was informed that SNF dates were 1/5-1/7.   Assessment/plan status:  Psychosocial Support/Ongoing Assessment of Needs Other assessment/ plan:   Information/referral to community resources:   [facility] skilled facility list.    PATIENT'S/FAMILY'S RESPONSE TO PLAN OF CARE: Patient wants to discharge to a skilled facility for administration of her antibiotic and rehab. Patient very pleasant and talkative.

## 2013-08-26 NOTE — Progress Notes (Signed)
Patient is AxO x4 and appears to be in a manic state. Pt has called nursing station 5 times back to back within the past hour requesting with multiple needs. Patient pacing back and forth with unsteady walk and presents with pressured speech, flight of ideas, and inability to accept any form of therapeutic communication at this time. She is focused on receiving pain medication and claims that the nurse during day shift never gave her pain or nausea medication contraindicated to the [facility]. Lamar Blinks NP notified. Awaiting call back. Will continue to monitor.  Velora Mediate

## 2013-08-27 LAB — GLUCOSE, CAPILLARY
GLUCOSE-CAPILLARY: 128 mg/dL — AB (ref 70–99)
GLUCOSE-CAPILLARY: 68 mg/dL — AB (ref 70–99)
Glucose-Capillary: 75 mg/dL (ref 70–99)

## 2013-08-27 MED ORDER — PREDNISONE 5 MG PO TABS
7.5000 mg | ORAL_TABLET | Freq: Every day | ORAL | Status: DC
  Administered 2013-08-27: 7.5 mg via ORAL
  Filled 2013-08-27 (×2): qty 1

## 2013-08-27 MED ORDER — HYDROCODONE-ACETAMINOPHEN 10-325 MG PO TABS: 1.0000 | ORAL_TABLET | ORAL | Status: DC | PRN

## 2013-08-27 MED ORDER — HEPARIN SOD (PORK) LOCK FLUSH 100 UNIT/ML IV SOLN
250.0000 [IU] | INTRAVENOUS | Status: AC | PRN
  Administered 2013-08-27: 250 [IU]

## 2013-08-27 MED ORDER — CLONAZEPAM 1 MG PO TABS
1.0000 mg | ORAL_TABLET | Freq: Two times a day (BID) | ORAL | Status: DC
Start: 2013-08-27 — End: 2013-08-28

## 2013-08-27 MED ORDER — ALUM & MAG HYDROXIDE-SIMETH 200-200-20 MG/5ML PO SUSP: 30.0000 mL | Freq: Four times a day (QID) | ORAL | Status: DC | PRN

## 2013-08-27 NOTE — Discharge Summary (Signed)
Physician Discharge Summary  [name] Carr:[id number] DOB: Jun 15, 1955 DOA: 08/21/2013  PCP: [name], MD  Admit date: 08/21/2013 Discharge date: 08/27/2013  Time spent: >45 minutes  Recommendations for Outpatient Follow-up:  1. Teflaro for 13 days 2. discharge wound care orders given 3. check weekly BNP (start in 7 days) and send results to [name] at [facility] clinic 4. F/u with ID in 2 wks 5. 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  She's had multiple admissions at [facility] for recurrent abscesses of the left upper arm. These admissions are as follows:  5/28-6/1  6/5-6/12  6/15-6/18  6/25-6/29  7/4-7/7   She come into the ER because she injured her left thigh with some thorns while working out in the yard which became red swollen and painful. She went [facility] where the abscess was drained and, subsequently, she was sent to [facility] for admission. She received a PICC and was started on vancomycin.      Hospital Course:  Principal Problem:  Cellulitis and abscess of left leg/ fever  - no culture sent after drainage - Leg ulcer appeared to still be indurated after  abscess drained- I asked for a surgical consult who then consulted ortho who recommended a CT  - CT of the thigh reveals underlying granulomas likely related to IM injections- this is why the leg and arm still feel indurated-  no acute abscess  - appreciate surgery and ortho assistance  - ID advises Teflaro for 2 wks and f/u in their clinic- patient declined to go home with home health and is asking for admission to SNF- she has been accepted to [facility] and will go there today  - increased Vicodin from 1 to 2 tabs Q6hrs rountinely for pain control on 7/20- she now wants it increased to Q 4hrs as she will be off of the IV Dilaudid once she leaves here- it is not making her sleepy or confused- I will go ahead and increase it  Active Problems:  Cellulitis in left upper arm  - has resolved with treatment  Stage IV breast cancer in remission   DIABETES MELLITUS, TYPE II  - controlled without medications   Diabetic neuropathy  Cont Gabapentin   Chronic pain syndrome  - cont Norco at home 1 tab of 10 mg every 6 hrs as needed-dose increased as mentioned above to treat acute pain- will need to taper back to usual dose as pain and wounds improve  SLE-on prednisone  - Follow at [facility]  - cont Prednisone - has injection granulomas in various areas of her body including right lower back, left thigh and left  upper arm    Procedures: 7/15- I and D in [facility] by ER   Consultations:  ID  Discharge Exam: Filed Vitals:   08/27/13 1000  BP: 152/83  Pulse: 91  Temp: 98.5 F (36.9 C)  Resp: 19    General: No acute respiratory distress  Lungs: Clear to auscultation bilaterally without wheezes or crackles  Cardiovascular: Regular rate and rhythm without murmur gallop or rub normal S1 and S2  Abdomen: Nontender, nondistended, soft, bowel sounds positive, no rebound, no ascites, no appreciable mass  Extremities: No significant cyanosis, clubbing, or edema bilateral lower  extremities  Skin: ulcer and erythema in left upper arm improving- induration still present. No significant erythema or edema in left thigh - packing present. Continues to feel indurated   Discharge Instructions You were cared for by a hospitalist during your hospital stay. If you have any questions about your discharge medications or the care you received while you were in the hospital after you are discharged, you can call the unit and asked to speak with the hospitalist on call if the hospitalist that took care of you is not available. Once you are discharged, your primary care physician will handle any further medical issues. Please note that NO REFILLS for any discharge medications will be authorized once you are discharged, as it is imperative that you return to your primary care physician (or establish a relationship with a primary care physician if you do not have one) for your aftercare needs so that they can reassess your need for medications and monitor your lab values.      Discharge Instructions   Diet - low sodium heart healthy    Complete by:  As directed      Increase activity slowly    Complete by:  As directed             Medication List         acetaminophen 500 MG tablet  Commonly known as:  TYLENOL  Take 1,000 mg by mouth every 6 (six) hours as needed (pain).     ACIDOPHILUS PO  Take 1-2 capsules by mouth 3 (three) times daily.     alum & mag hydroxide-simeth 200-200-20 MG/5ML suspension  Commonly known as:  MAALOX/MYLANTA  Take 30 mLs by mouth every 6 (six) hours as needed for indigestion or heartburn (dyspepsia).     bisacodyl 5 MG EC tablet  Generic drug:  bisacodyl  Take 5 mg by mouth daily as needed for moderate constipation.     ceftaroline 600 mg in sodium chloride 0.9 % 250 mL  Inject 600 mg into the vein every 12 (twelve) hours.     clonazePAM 1 MG tablet  Commonly known as:  KLONOPIN  Take 1 tablet (1 mg total) by mouth 2 (two) times daily.      docusate sodium 100 MG capsule  Commonly known as:  COLACE  Take 200 mg by mouth 2 (two) times daily.     DULoxetine 60 MG capsule  Commonly known as:  CYMBALTA  Take 1 capsule (60 mg total) by mouth 2 (two) times daily.     EPIPEN 0.3 mg/0.3 mL Soaj injection  Generic drug:  EPINEPHrine  Inject 0.3 mg into the muscle once.     furosemide 20 MG tablet  Commonly known as:  LASIX  Take 20 mg by mouth daily.     gabapentin 300 MG capsule  Commonly known as:  NEURONTIN  Take 3 capsules (900  mg total) by mouth 3 (three) times daily.     hydrochlorothiazide 25 MG tablet  Commonly known as:  HYDRODIURIL  Take 25 mg by mouth daily as needed (for lower leg edema).     HYDROcodone-acetaminophen 10-325 MG per tablet  Commonly known as:  NORCO  Take 1 tablet by mouth every 4 (four) hours as needed (pain).     pantoprazole 40 MG tablet  Commonly known as:  PROTONIX  Take 40 mg by mouth daily.     potassium chloride SA 20 MEQ tablet  Commonly known as:  K-DUR,KLOR-CON  Take 20 mEq by mouth daily.     predniSONE 5 MG tablet  Commonly known as:  DELTASONE  Take 7.5 mg by mouth daily with breakfast.     promethazine 25 MG suppository  Commonly known as:  PHENERGAN  Place 25 mg rectally every 6 (six) hours as needed for nausea or vomiting.     rOPINIRole 1 MG tablet  Commonly known as:  REQUIP  Take 1 mg by mouth at bedtime.     senna-docusate 8.6-50 MG per tablet  Commonly known as:  Senokot-S  Take 1 tablet by mouth 2 (two) times daily as needed.     traZODone 50 MG tablet  Commonly known as:  DESYREL  Take 50-150 mg by mouth at bedtime as needed for sleep.       Allergies  Allergen Reactions  . Ammonia Other (See Comments)    Ended up on ventilator from ammonia tabs  . Contrast Media [Iodinated Diagnostic Agents] Other (See Comments)    Doesn't breath well.  . Iohexol Other (See Comments)    SOB   . Methotrexate Anaphylaxis  . Midazolam Hcl Anaphylaxis  .  Nalbuphine Other (See Comments)    Can't breathe well  . Infliximab Nausea And Vomiting  . Ketorolac Tromethamine Other (See Comments)    Injectable doesn't work and pill hurts stomach.  . Nsaids Nausea And Vomiting  . Celecoxib Rash      The results of significant diagnostics from this hospitalization (including imaging, microbiology, ancillary and laboratory) are listed below for reference.    Significant Diagnostic Studies: Ct Femur Left Wo Contrast  08/23/2013   CLINICAL DATA:  Questionable left thigh mass with superimposed infection status post bedside incision and drainage x2. Evaluate for abscess. History of stage IV breast cancer.  EXAM: CT OF THE LEFT FEMUR WITHOUT CONTRAST  TECHNIQUE: Multi detector CT imaging of the left thigh was performed without contrast. Multiplanar reformatted images were generated.  COMPARISON:  Pelvic CT 02/15/2013, PET-CT 01/15/2013 and hip radiographs 02/12/2009 and 08/09/2008.  FINDINGS: There is a chronic collection of coarse calcifications anterolaterally in the subcutaneous fat of the mid left thigh. These calcifications are radiographically stable from radiographs dating back to 2010. There are similar calcifications in the right thigh and both buttocks. Surrounding the calcifications, there is ill-defined soft tissue stranding and a small amount of gas, the latter attributed to the recent procedure. No drainable fluid collection identified.  No intramuscular inflammatory change, fluid collection or calcification is demonstrated.  The left femur appears normal.  IMPRESSION: 1. Chronic subcutaneous calcifications in the anterior left thigh, similar to those demonstrated in the right thigh and both buttocks. These calcifications are probably related to chronic injection granulomas or the patient's systemic lupus erythematosus. 2. Nonspecific adjacent soft tissue stranding and gas. These findings may be related to the recent procedure or reflect superimposed  cellulitis. No drainable abscess identified. 3. No  evidence of deep soft tissue infection or osteomyelitis.   Electronically Signed   By: Camie Patience M.D.   On: 08/23/2013 14:57   Dg Chest Port 1 View  08/22/2013   CLINICAL DATA:  Status post PICC placement  EXAM: PORTABLE CHEST - 1 VIEW  COMPARISON:  07/23/13  FINDINGS: Cardiac shadow is stable. A right-sided PICC line is now seen at the cavoatrial junction. The lungs are clear bilaterally. No acute bony abnormality is seen.  IMPRESSION: PICC line in satisfactory position.   Electronically Signed   By: Inez Catalina M.D.   On: 08/22/2013 11:05    Microbiology: Recent Results (from the past 240 hour(s))  MRSA PCR SCREENING     Status: Abnormal   Collection Time    08/22/13  2:36 AM      Result Value Ref Range Status   MRSA by PCR POSITIVE (*) NEGATIVE Final   Comment:            The GeneXpert MRSA Assay (FDA     approved for NASAL specimens     only), is one component of a     comprehensive MRSA colonization     surveillance program. It is not     intended to diagnose MRSA     infection nor to guide or     monitor treatment for     MRSA infections.     RESULT CALLED TO, READ BACK BY AND VERIFIED WITH:     BIRAGO,M RN [id number] AT 0421 SKEEN,P     Labs: Basic Metabolic Panel:  Recent Labs Lab 08/21/13 1957 08/22/13 1520 08/24/13 0443 08/26/13 0430  NA 137 139 142 143  K 3.4* 3.2* 3.7 3.9  CL 93* 95* 102 102  CO2 27 28 31 31   GLUCOSE 111* 163* 118* 87  BUN 14 17 16 16   CREATININE 0.90 0.79 0.74 0.69  CALCIUM 9.6 8.9 8.6 8.5   Liver Function Tests:  Recent Labs Lab 08/26/13 0430  AST 14  ALT 8  ALKPHOS 85  BILITOT <0.2*  PROT 6.1  ALBUMIN 3.0*   No results found for this basename: LIPASE, AMYLASE,  in the last 168 hours No results found for this basename: AMMONIA,  in the last 168 hours CBC:  Recent Labs Lab 08/21/13 1957 08/22/13 1520 08/26/13 0430  WBC 11.0* 7.9 8.1  HGB 12.1 11.2* 10.3*  HCT 37.2 35.2*  33.4*  MCV 92.3 91.9 96.3  PLT 315 280 171   Cardiac Enzymes: No results found for this basename: CKTOTAL, CKMB, CKMBINDEX, TROPONINI,  in the last 168 hours BNP: BNP (last 3 results)  Recent Labs  11/11/12 0500  PROBNP 462.1*   CBG:  Recent Labs Lab 08/26/13 1140 08/26/13 1617 08/26/13 2222 08/27/13 0750 08/27/13 1128  GLUCAP 97 123* 128* 68* 75       SignedDebbe Odea, MD [facility] 08/27/2013, 1:52 PM

## 2013-08-27 NOTE — Care Management Note (Signed)
CARE MANAGEMENT NOTE 08/27/2013  Patient:  [name] Carr, [name] Carr   Account Number:  [id number]  Date Initiated:  08/26/2013  Documentation initiated by:  Jasmine Pang  Subjective/Objective Assessment:   Order for [facility] and IV antibiotic.     Action/Plan:   Met with pt re HHRN and IV antibiotics, pt is familiar with HH and adm of IV antibiotics, however pt is interested in short term SNF to receive the IV meds and daily PT.   Anticipated DC Date:  08/28/2013   Anticipated DC Plan:  SKILLED NURSING FACILITY  In-house referral  Clinical Social Worker         Choice offered to / List presented to:             Status of service:  In process, will continue to follow Medicare Important Message given?  YES (If response is "NO", the following Medicare IM given date fields will be blank) Date Medicare IM given:  08/27/2013 Medicare IM given by:  Tregan Read Date Additional Medicare IM given:   Additional Medicare IM given by:    Discharge Disposition:  [place]  Per UR Regulation:    If discussed at Long Length of Stay Meetings, dates discussed:    Comments:

## 2013-08-27 NOTE — Progress Notes (Signed)
Discharged to SNF per order. Friend to tranport to facility. Report called and pt packed. Discharge packet sent with friend. Margarito Liner

## 2013-08-27 NOTE — Evaluation (Signed)
Physical Therapy Evaluation Patient Details Name: [name] Carr MRN: [id number] DOB: 07-30-1955 Today's Date: 08/27/2013   History of Present Illness  Admitted with cellulitis; PICC placed and on vanc at time of PT eval  Clinical Impression  Pt admitted with above. Pt currently with functional limitations due to the deficits listed below (see PT Problem List).  Pt will benefit from skilled PT to increase their independence and safety with mobility to allow discharge to the venue listed below.       Follow Up Recommendations SNF;Supervision/Assistance - 24 hour    Equipment Recommendations  Rolling walker with 5" wheels;3in1 (PT)    Recommendations for Other Services       Precautions / Restrictions Restrictions Weight Bearing Restrictions: No      Mobility  Bed Mobility                  Transfers Overall transfer level: Needs assistance Equipment used: Rolling walker (2 wheeled) Transfers: Sit to/from Stand Sit to Stand: Supervision         General transfer comment: Cues for hand placement and safety  Ambulation/Gait Ambulation/Gait assistance: Min guard Ambulation Distance (Feet): 10 Feet (5+5) Assistive device: Rolling walker (2 wheeled);1 person hand held assist Gait Pattern/deviations: Step-to pattern     General Gait Details: extremely distractable during gait; cues to bear weight through RW to take pressure off of painful LLE  Stairs            Wheelchair Mobility    Modified Rankin (Stroke Patients Only)       Balance                                             Pertinent Vitals/Pain Reports LLE pain; cued to unweigh painful LLE by pressing down into RW    Home Living Family/patient expects to be discharged to:: Private residence Living Arrangements: Alone Available Help at Discharge: Friend(s);Available PRN/intermittently Type of Home: House Home Access: Stairs to enter Entrance Stairs-Rails: Right Entrance  Stairs-Number of Steps: 4 Home Layout: One level Home Equipment: Walker - 4 wheels;Cane - single point      Prior Function Level of Independence: Independent with assistive device(s)         Comments: uses cane prn     Hand Dominance        Extremity/Trunk Assessment   Upper Extremity Assessment: Overall WFL for tasks assessed (grossly)           Lower Extremity Assessment: LLE deficits/detail   LLE Deficits / Details: noted pt with decr WBing LLE in standing, likely due to pain     Communication   Communication: No difficulties  Cognition Arousal/Alertness: Awake/alert Behavior During Therapy: Restless;Impulsive Overall Cognitive Status: Within Functional Limits for tasks assessed (though extremely distractable)                      General Comments      Exercises        Assessment/Plan    PT Assessment Patient needs continued PT services  PT Diagnosis Difficulty walking;Acute pain   PT Problem List Decreased strength;Decreased range of motion;Decreased activity tolerance;Decreased balance;Decreased knowledge of use of DME;Pain  PT Treatment Interventions DME instruction;Gait training;Stair training;Functional mobility training;Therapeutic activities;Therapeutic exercise;Balance training;Patient/family education;Cognitive remediation   PT Goals (Current goals can be found in the Care Plan section) Acute Rehab  PT Goals Patient Stated Goal: reports will be going to [facility] PT Goal Formulation: With patient Time For Goal Achievement: 09/10/13 Potential to Achieve Goals: Good    Frequency Min 3X/week   Barriers to discharge Decreased caregiver support lives alone    Co-evaluation               End of Session   Activity Tolerance: Patient tolerated treatment well Patient left: in bed;with family/visitor present;with call bell/phone within reach (sitting EOB) Nurse Communication: Mobility status         Time: 5883-2549 PT  Time Calculation (min): 13 min   Charges:   PT Evaluation $Initial PT Evaluation Tier I: 1 Procedure     PT G CodesQuin Hoop 08/27/2013, 2:53 PM Roney Marion, [place] Pager [phone] Office [phone]

## 2013-08-27 NOTE — Progress Notes (Signed)
Received patient w/o bed alarm, patient alert and oriented and refused to have it on.

## 2013-08-28 ENCOUNTER — Other Ambulatory Visit: Payer: Self-pay | Admitting: *Deleted

## 2013-08-28 ENCOUNTER — Non-Acute Institutional Stay (SKILLED_NURSING_FACILITY): Payer: Medicare Other | Admitting: Internal Medicine

## 2013-08-28 ENCOUNTER — Telehealth: Payer: Self-pay | Admitting: Internal Medicine

## 2013-08-28 ENCOUNTER — Encounter: Payer: Self-pay | Admitting: Internal Medicine

## 2013-08-28 DIAGNOSIS — L02419 Cutaneous abscess of limb, unspecified: Secondary | ICD-10-CM

## 2013-08-28 DIAGNOSIS — F609 Personality disorder, unspecified: Secondary | ICD-10-CM

## 2013-08-28 DIAGNOSIS — E119 Type 2 diabetes mellitus without complications: Secondary | ICD-10-CM

## 2013-08-28 DIAGNOSIS — I10 Essential (primary) hypertension: Secondary | ICD-10-CM

## 2013-08-28 DIAGNOSIS — M329 Systemic lupus erythematosus, unspecified: Secondary | ICD-10-CM

## 2013-08-28 DIAGNOSIS — G894 Chronic pain syndrome: Secondary | ICD-10-CM

## 2013-08-28 DIAGNOSIS — L03119 Cellulitis of unspecified part of limb: Secondary | ICD-10-CM

## 2013-08-28 DIAGNOSIS — L03114 Cellulitis of left upper limb: Secondary | ICD-10-CM

## 2013-08-28 DIAGNOSIS — C50919 Malignant neoplasm of unspecified site of unspecified female breast: Secondary | ICD-10-CM

## 2013-08-28 DIAGNOSIS — G589 Mononeuropathy, unspecified: Secondary | ICD-10-CM

## 2013-08-28 DIAGNOSIS — G629 Polyneuropathy, unspecified: Secondary | ICD-10-CM | POA: Insufficient documentation

## 2013-08-28 DIAGNOSIS — IMO0002 Reserved for concepts with insufficient information to code with codable children: Secondary | ICD-10-CM

## 2013-08-28 MED ORDER — HYDROCODONE-ACETAMINOPHEN 5-325 MG PO TABS: ORAL_TABLET | ORAL | Status: DC

## 2013-08-28 MED ORDER — CLONAZEPAM 1 MG PO TABS: ORAL_TABLET | ORAL | Status: DC

## 2013-08-28 NOTE — Assessment & Plan Note (Signed)
On lasix and HCTZ

## 2013-08-28 NOTE — Assessment & Plan Note (Signed)
Controled without meds

## 2013-08-28 NOTE — Assessment & Plan Note (Addendum)
-   no culture sent after drainage  - Leg ulcer appeared to still be indurated after abscess drained- I asked for a surgical consult who then consulted ortho who recommended a CT  - CT of the thigh reveals underlying granulomas likely related to IM injections- this is why the leg and arm still feel indurated- no acute abscess  - appreciate surgery and ortho assistance  - ID advises Teflaro for 2 wks and f/u in their clinic- patient declined to go home with home health and is asking for admission to SNF- she has been accepted to [facility] and will go there today  - increased Vicodin from 1 to 2 tabs Q6hrs rountinely for pain control on 7/20- she now wants it increased to Q 4hrs as she will be off of the IV Dilaudid once she leaves here- it is not making her sleepy or confused- I will go ahead and increase it; adding soma for muscle spasms

## 2013-08-28 NOTE — Assessment & Plan Note (Signed)
-   has resolved with treatment

## 2013-08-28 NOTE — Assessment & Plan Note (Signed)
Noted;no mention of which one

## 2013-08-28 NOTE — Assessment & Plan Note (Signed)
Stated as in remission

## 2013-08-28 NOTE — Telephone Encounter (Signed)
[facility] 

## 2013-08-28 NOTE — Assessment & Plan Note (Signed)
Continue neurontin 

## 2013-08-28 NOTE — Progress Notes (Signed)
MRN: [id number] Name: [name] Carr  Sex: female Age: 58 y.o. DOB: 1955/03/08  [place] #: [facility] Facility/Room: 103 Level Of Care: SNF Provider: Inocencio Homes D Emergency Contacts: Extended Emergency Contact Information Primary Emergency Contact: [place], [place] [address] [place] Phone: [phone] Relation: Friend Secondary Emergency Contact: [place], [place] [address] [place] Phone: [phone] Relation: Friend  Code Status:FULL   Allergies: Ammonia; Contrast media; Iohexol; Methotrexate; Midazolam hcl; Nalbuphine; Infliximab; Ketorolac tromethamine; Nsaids; and Celecoxib  Chief Complaint  Patient presents with  . nursing home admission    HPI: Patient is 58 y.o. female who is admitted with leg wound, cellulitis LUE and IV abx for same, generalized weakness for OT/PT.  Past Medical History  Diagnosis Date  . PERSONALITY DISORDER   . Chronic pain syndrome     chronic narcotics, dependancy hx - dakwa for pain mgmt  . MIGRAINE HEADACHE   . Lyme disease   . ALLERGIC RHINITIS   . ANEMIA-IRON DEFICIENCY   . HYPERLIPIDEMIA   . GERD   . DIABETES MELLITUS, TYPE II   . DEPRESSION   . SLE (systemic lupus erythematosus)     [facility] - chronic pred  . Psoriasis   . Addison's disease   . Degenerative joint disease   . DDD (degenerative disc disease)   . Phlebitis after infusion     left ?calf; "S/P phenergan/demerol injection"  . Blood clot in vein 2011; 02/04/11    "right jugular vein;left jugular vein"  . SEIZURE DISORDER     "lots of seizures; due to lupus"  . Anxiety   . Neuropathy     hands, feet, due to diabetes & back problem  . Surgical wound infection 02/15/2011    R shoulder and mastectomy site - related to PICC  . CARCINOMA, BREAST 08/2009 dx    R breast,  mets to liver - resolution s/p chemo, s/p B mastect  . Metastases to the liver   . RA (rheumatoid arthritis)    . Hypothyroidism pt states she has never had hy pothyroidism  . Hypertension     Past Surgical History  Procedure Laterality Date  . Liver bopsy  09/2009  . Port a cath placement  09/2009; 11/19/10  . Port-a-cath removal  04/2010    "due to staph & strept"  . Port-a-cath removal  12/23/2010    Procedure: REMOVAL PORT-A-CATH;  Surgeon: Pedro Earls, MD;  Location: [facility];  Service: General;  Laterality: Left;  . Breast surgery  09/2009    right breast biopsy  . Mastectomy  11/19/10    bilateral mastectomy   . I&d extremity  ~ 2010    right hand; S/P "dog bite"  . Peripherally inserted central catheter insertion    . Spider bite        Medication List       This list is accurate as of: 08/28/13 11:59 PM.  Always use your most recent med list.               acetaminophen 500 MG tablet  Commonly known as:  TYLENOL  Take 1,000 mg by mouth every 6 (six) hours as needed (pain).     ACIDOPHILUS PO  Take 1-2 capsules by mouth 3 (three) times daily.     alum & mag hydroxide-simeth 200-200-20 MG/5ML suspension  Commonly known as:  MAALOX/MYLANTA  Take 30 mLs by mouth every 6 (six) hours as needed for indigestion or heartburn (dyspepsia).     bisacodyl 5 MG EC tablet  Generic drug:  bisacodyl  Take 5 mg by mouth daily as needed for moderate constipation.     ceftaroline 600 mg in sodium chloride 0.9 % 250 mL  Inject 600 mg into the vein every 12 (twelve) hours.     clonazePAM 1 MG tablet  Commonly known as:  KLONOPIN  Take one tablet by mouth twice daily     docusate sodium 100 MG capsule  Commonly known as:  COLACE  Take 200 mg by mouth 2 (two) times daily.     DULoxetine 60 MG capsule  Commonly known as:  CYMBALTA  Take 1 capsule (60 mg total) by mouth 2 (two) times daily.     EPIPEN 0.3 mg/0.3 mL Soaj injection  Generic drug:  EPINEPHrine  Inject 0.3 mg into the muscle once.     furosemide 20 MG tablet  Commonly known as:  LASIX  Take 20 mg by mouth daily.      gabapentin 300 MG capsule  Commonly known as:  NEURONTIN  Take 3 capsules (900 mg total) by mouth 3 (three) times daily.     hydrochlorothiazide 25 MG tablet  Commonly known as:  HYDRODIURIL  Take 25 mg by mouth daily as needed (for lower leg edema).     HYDROcodone-acetaminophen 10-325 MG per tablet  Commonly known as:  NORCO  Take 1 tablet by mouth every 4 (four) hours as needed (pain).     HYDROcodone-acetaminophen 5-325 MG per tablet  Commonly known as:  NORCO/VICODIN  Take two tablets by mouth every four hours as needed for pain     pantoprazole 40 MG tablet  Commonly known as:  PROTONIX  Take 40 mg by mouth daily.     potassium chloride SA 20 MEQ tablet  Commonly known as:  K-DUR,KLOR-CON  Take 20 mEq by mouth daily.     predniSONE 5 MG tablet  Commonly known as:  DELTASONE  Take 7.5 mg by mouth daily with breakfast.     promethazine 25 MG suppository  Commonly known as:  PHENERGAN  Place 25 mg rectally every 6 (six) hours as needed for nausea or vomiting.     rOPINIRole 1 MG tablet  Commonly known as:  REQUIP  Take 1 mg by mouth at bedtime.     senna-docusate 8.6-50 MG per tablet  Commonly known as:  Senokot-S  Take 1 tablet by mouth 2 (two) times daily as needed.     traZODone 50 MG tablet  Commonly known as:  DESYREL  Take 50-150 mg by mouth at bedtime as needed for sleep.        No orders of the defined types were placed in this encounter.    Immunization History  Administered Date(s) Administered  . Influenza Split 01/08/2012  . Influenza,inj,Quad PF,36+ Mos 01/01/2013  . Pneumococcal Polysaccharide-23 02/08/2008  . Td 02/07/2006    History  Substance Use Topics  . Smoking status: Never Smoker   . Smokeless tobacco: Never Used  . Alcohol Use: No    Family history is noncontributory    Review of Systems  DATA OBTAINED: from patient GENERAL: Feels well no fevers, fatigue, appetite changes SKIN: wound L thigh dressed with  packing EYES: No eye pain, redness, discharge EARS: No earache, tinnitus, change in hearing NOSE: No congestion, drainage or bleeding  MOUTH/THROAT: No mouth or tooth pain,  RESPIRATORY: No cough, wheezing, SOB CARDIAC: No chest pain, palpitations, lower extremity edema  GI: No abdominal pain, No N/V/D or constipation, No heartburn or reflux  GU: No dysuria, frequency or urgency, or incontinence  MUSCULOSKELETAL: pain in leg, admits would like more pain med;also c/o spasms in leg NEUROLOGIC: No headache, dizziness or focal weakness PSYCHIATRIC: pt is very concerned about her dogs  Filed Vitals:   08/28/13 1144  BP: 166/92  Pulse: 76  Temp: 97.2 F (36.2 C)  Resp: 20    Physical Exam  GENERAL APPEARANCE: Alert, conversant. Appropriately groomed. No acute distress.  SKIN: No diaphoresis rash; L thigh wound with dressing; LUE looks good HEAD: Normocephalic, atraumatic  EYES: Conjunctiva/lids clear. Pupils round, reactive. EOMs intact.  EARS: External exam WNL, canals clear. Hearing grossly normal.  NOSE: No deformity or discharge.  MOUTH/THROAT: Lips w/o lesions.   RESPIRATORY: Breathing is even, unlabored. Lung sounds are clear   CARDIOVASCULAR: Heart RRR no murmurs, rubs or gallops. No peripheral edema.   GASTROINTESTINAL: Abdomen is soft, non-tender, not distended w/ normal bowel sounds GENITOURINARY: Bladder non tender, not distended  MUSCULOSKELETAL: No abnormal joints or musculature NEUROLOGIC: Oriented X3. Cranial nerves 2-12 grossly intact. Moves all extremities no tremor. PSYCHIATRIC: sad, no behavioral issues  Patient Active Problem List   Diagnosis Date Noted  . Neuropathy 08/28/2013  . Hypertension   . Cellulitis of left upper arm 08/24/2013  . Cellulitis and abscess of leg 08/22/2013  . Hypokalemia 08/22/2013  . Anemia of other chronic disease 12/19/2012  . Edema 12/19/2012  . Syncope 01/11/2012  . Hyponatremia 12/17/2011  . Abscess or cellulitis of chest  wall 02/15/2011  . Oral ulcer 02/09/2011  . Cellulitis 02/09/2011  . SLE-on prednisone-Follow at [facility] 02/05/2011  . DVT L IJ 12/26/10 02/04/2011  . Normocytic anemia 12/27/2010  . Immunocompromised patient 12/19/2010  . Stage IV breast cancer in remission 09/07/2009  . DIABETES MELLITUS, TYPE II 09/07/2009  . HYPERLIPIDEMIA 09/07/2009  . ANEMIA-IRON DEFICIENCY 09/07/2009  . Chronic pain syndrome 09/07/2009  . MIGRAINE HEADACHE 09/07/2009  . ALLERGIC RHINITIS 09/07/2009  . GERD 09/07/2009  . SEIZURE DISORDER 09/07/2009  . PERSONALITY DISORDER 09/01/2009  . DEPRESSION 09/01/2009  . RENAL INSUFFICIENCY 09/01/2009    CBC    Component Value Date/Time   WBC 8.1 08/26/2013 0430   WBC 10.7* 04/08/2013 1148   RBC 3.47* 08/26/2013 0430   RBC 3.83 04/08/2013 1148   RBC 4.52 05/05/2009 1211   HGB 10.3* 08/26/2013 0430   HGB 8.4* 04/08/2013 1148   HCT 33.4* 08/26/2013 0430   HCT 27.7* 04/08/2013 1148   PLT 171 08/26/2013 0430   PLT 289 04/08/2013 1148   MCV 96.3 08/26/2013 0430   MCV 72.4* 04/08/2013 1148   LYMPHSABS 0.6* 04/08/2013 1148   LYMPHSABS 1.9 02/12/2013 0352   MONOABS 0.6 04/08/2013 1148   MONOABS 0.8 02/12/2013 0352   EOSABS 0.1 04/08/2013 1148   EOSABS 0.1 02/12/2013 0352   BASOSABS 0.1 04/08/2013 1148   BASOSABS 0.1 02/12/2013 0352    CMP     Component Value Date/Time   NA 143 08/26/2013 0430   NA 135 04/25/2013 1439   NA 135* 04/08/2013 1148   K 3.9 08/26/2013 0430   K 3.5 04/25/2013 1439   K 4.6 04/08/2013 1148   CL 102 08/26/2013 0430   CL 90* 04/25/2013 1439   CL 91* 03/15/2012 1410   CO2 31 08/26/2013 0430   CO2 32 04/25/2013  1439   CO2 28 04/08/2013 1148   GLUCOSE 87 08/26/2013 0430   GLUCOSE 201* 04/25/2013 1439   GLUCOSE 268* 04/08/2013 1148   GLUCOSE 131* 03/15/2012 1410   BUN 16 08/26/2013 0430   BUN 26* 04/25/2013 1439   BUN 29.9* 04/08/2013 1148   CREATININE 0.69 08/26/2013 0430   CREATININE 0.9 04/25/2013 1439   CREATININE 1.2* 04/08/2013 1148   CALCIUM 8.5 08/26/2013 0430   CALCIUM 9.9 04/25/2013  1439   CALCIUM 9.6 04/08/2013 1148   PROT 6.1 08/26/2013 0430   PROT 8.5* 04/25/2013 1439   PROT 6.9 04/08/2013 1148   ALBUMIN 3.0* 08/26/2013 0430   ALBUMIN 3.4* 04/08/2013 1148   AST 14 08/26/2013 0430   AST 21 04/25/2013 1439   AST 17 04/08/2013 1148   ALT 8 08/26/2013 0430   ALT 36 04/25/2013 1439   ALT 18 04/08/2013 1148   ALKPHOS 85 08/26/2013 0430   ALKPHOS 102* 04/25/2013 1439   ALKPHOS 100 04/08/2013 1148   BILITOT <0.2* 08/26/2013 0430   BILITOT 0.70 04/25/2013 1439   BILITOT 0.34 04/08/2013 1148   GFRNONAA >90 08/26/2013 0430   GFRAA >90 08/26/2013 0430    Assessment and Plan  Cellulitis and abscess of leg   - no culture sent after drainage  - Leg ulcer appeared to still be indurated after abscess drained- I asked for a surgical consult who then consulted ortho who recommended a CT  - CT of the thigh reveals underlying granulomas likely related to IM injections- this is why the leg and arm still feel indurated- no acute abscess  - appreciate surgery and ortho assistance  - ID advises Teflaro for 2 wks and f/u in their clinic- patient declined to go home with home health and is asking for admission to SNF- she has been accepted to [facility] and will go there today  - increased Vicodin from 1 to 2 tabs Q6hrs rountinely for pain control on 7/20- she now wants it increased to Q 4hrs as she will be off of the IV Dilaudid once she leaves here- it is not making her sleepy or confused- I will go ahead and increase it   Cellulitis of left upper arm - has resolved with treatment      DIABETES MELLITUS, TYPE II Controled without meds  Stage IV breast cancer in remission Stated as in remission  Chronic pain syndrome cont Norco at home 1 tab of 10 mg every 6 hrs as needed-dose increased as mentioned above to treat acute pain- will need to taper back to usual dose as pain and wounds improve   SLE-on prednisone-Follow at [facility] Follow at [facility]  - cont Prednisone  - has injection granulomas in  various areas of her body including right lower back, left thigh and left upper arm    PERSONALITY DISORDER Noted;no mention of which one  Neuropathy Continue neurontin  Hypertension On lasix and HCTZ    Hennie Duos, MD

## 2013-08-28 NOTE — Assessment & Plan Note (Signed)
Follow at [facility]  - cont Prednisone  - has injection granulomas in various areas of her body including right lower back, left thigh and left upper arm

## 2013-08-28 NOTE — Telephone Encounter (Signed)
Nursing home unable to afford ceftaroline. Wanted substitute. i gave verbal order to do vancomycin per protocol for dosing of trough goal of 10-15 to check before 4th dose. Dose and interval to be determined by nursing home pharmacy.

## 2013-08-28 NOTE — Telephone Encounter (Signed)
[facility] 

## 2013-08-28 NOTE — Assessment & Plan Note (Signed)
cont Norco at home 1 tab of 10 mg every 6 hrs as needed-dose increased as mentioned above to treat acute pain- will need to taper back to usual dose as pain and wounds improve

## 2013-08-29 ENCOUNTER — Other Ambulatory Visit: Payer: Self-pay | Admitting: *Deleted

## 2013-08-29 MED ORDER — CARISOPRODOL 350 MG PO TABS: ORAL_TABLET | ORAL | Status: DC

## 2013-08-29 NOTE — Telephone Encounter (Signed)
[facility] 

## 2013-08-30 ENCOUNTER — Non-Acute Institutional Stay (SKILLED_NURSING_FACILITY): Payer: Medicare Other | Admitting: Internal Medicine

## 2013-08-30 ENCOUNTER — Encounter: Payer: Self-pay | Admitting: Internal Medicine

## 2013-08-30 DIAGNOSIS — I10 Essential (primary) hypertension: Secondary | ICD-10-CM

## 2013-08-30 DIAGNOSIS — C50919 Malignant neoplasm of unspecified site of unspecified female breast: Secondary | ICD-10-CM

## 2013-08-30 DIAGNOSIS — D638 Anemia in other chronic diseases classified elsewhere: Secondary | ICD-10-CM

## 2013-08-30 DIAGNOSIS — L02419 Cutaneous abscess of limb, unspecified: Secondary | ICD-10-CM

## 2013-08-30 DIAGNOSIS — L03119 Cellulitis of unspecified part of limb: Principal | ICD-10-CM

## 2013-08-30 DIAGNOSIS — M329 Systemic lupus erythematosus, unspecified: Secondary | ICD-10-CM

## 2013-08-30 DIAGNOSIS — F329 Major depressive disorder, single episode, unspecified: Secondary | ICD-10-CM

## 2013-08-30 DIAGNOSIS — K219 Gastro-esophageal reflux disease without esophagitis: Secondary | ICD-10-CM

## 2013-08-30 DIAGNOSIS — F3289 Other specified depressive episodes: Secondary | ICD-10-CM

## 2013-08-30 DIAGNOSIS — E876 Hypokalemia: Secondary | ICD-10-CM

## 2013-08-30 DIAGNOSIS — E785 Hyperlipidemia, unspecified: Secondary | ICD-10-CM

## 2013-08-30 DIAGNOSIS — G894 Chronic pain syndrome: Secondary | ICD-10-CM

## 2013-08-30 NOTE — Progress Notes (Signed)
MRN: [id number] Name: [name] Carr  Sex: female Age: 58 y.o. DOB: 1955/05/12  [place] #: [facility] Facility/Room: 103 Level Of Care: SNF Provider: Inocencio Homes D Emergency Contacts: Extended Emergency Contact Information Primary Emergency Contact: [place], [place] [address] [place] Phone: [phone] Relation: Friend Secondary Emergency Contact: [place], [place] [address] [place] Phone: [phone] Relation: Friend  Code Status: FULL  Allergies: Ammonia; Contrast media; Iohexol; Methotrexate; Midazolam hcl; Nalbuphine; Infliximab; Ketorolac tromethamine; Nsaids; and Celecoxib  Chief Complaint  Patient presents with  . Discharge Note    HPI: Patient is 58 y.o. female who was admitted for OT/PT and IV abx for leg abscess who is being discharged today.  Past Medical History  Diagnosis Date  . PERSONALITY DISORDER   . Chronic pain syndrome     chronic narcotics, dependancy hx - d[place]wa for pain mgmt  . MIGRAINE HEADACHE   . Lyme disease   . ALLERGIC RHINITIS   . ANEMIA-IRON DEFICIENCY   . HYPERLIPIDEMIA   . GERD   . DIABETES MELLITUS, TYPE II   . DEPRESSION   . SLE (systemic lupus erythematosus)     [facility] - chronic pred  . Psoriasis   . Addison's disease   . Degenerative joint disease   . DDD (degenerative disc disease)   . Phlebitis after infusion     left ?calf; "S/P phenergan/demerol injection"  . Blood clot in vein 2011; 02/04/11    "right jugular vein;left jugular vein"  . SEIZURE DISORDER     "lots of seizures; due to lupus"  . Anxiety   . Neuropathy     hands, feet, due to diabetes & back problem  . Surgical wound infection 02/15/2011    R shoulder and mastectomy site - related to PICC  . CARCINOMA, BREAST 08/2009 dx    R breast,  mets to liver - resolution s/p chemo, s/p B mastect  . Metastases to the liver   . RA (rheumatoid arthritis)   . Hypothyroidism pt  states she has never had hy pothyroidism  . Hypertension     Past Surgical History  Procedure Laterality Date  . Liver bopsy  09/2009  . Port a cath placement  09/2009; 11/19/10  . Port-a-cath removal  04/2010    "due to staph & strept"  . Port-a-cath removal  12/23/2010    Procedure: REMOVAL PORT-A-CATH;  Surgeon: Pedro Earls, MD;  Location: [facility];  Service: General;  Laterality: Left;  . Breast surgery  09/2009    right breast biopsy  . Mastectomy  11/19/10    bilateral mastectomy   . I&d extremity  ~ 2010    right hand; S/P "dog bite"  . Peripherally inserted central catheter insertion    . Spider bite        Medication List       This list is accurate as of: 08/30/13 12:50 PM.  Always use your most recent med list.               acetaminophen 500 MG tablet  Commonly known as:  TYLENOL  Take 1,000 mg by mouth every 6 (six) hours as needed (pain).     alum & mag hydroxide-simeth 200-200-20 MG/5ML suspension  Commonly known as:  MAALOX/MYLANTA  Take 30 mLs by mouth every 6 (six) hours as needed for  indigestion or heartburn (dyspepsia).     bisacodyl 5 MG EC tablet  Generic drug:  bisacodyl  Take 5 mg by mouth daily as needed for moderate constipation.     carisoprodol 350 MG tablet  Commonly known as:  SOMA  Take one tablet by mouth three times daily; Take one tablet by mouth at bedtime     clonazePAM 1 MG tablet  Commonly known as:  KLONOPIN  Take one tablet by mouth twice daily     docusate sodium 100 MG capsule  Commonly known as:  COLACE  Take 200 mg by mouth 2 (two) times daily.     DULoxetine 60 MG capsule  Commonly known as:  CYMBALTA  Take 1 capsule (60 mg total) by mouth 2 (two) times daily.     EPIPEN 0.3 mg/0.3 mL Soaj injection  Generic drug:  EPINEPHrine  Inject 0.3 mg into the muscle once.     furosemide 20 MG tablet  Commonly known as:  LASIX  Take 40 mg by mouth daily.     gabapentin 300 MG capsule  Commonly known as:  NEURONTIN   Take 3 capsules (900 mg total) by mouth 3 (three) times daily.     hydrochlorothiazide 25 MG tablet  Commonly known as:  HYDRODIURIL  Take 25 mg by mouth daily as needed (for lower leg edema).     HYDROcodone-acetaminophen 5-325 MG per tablet  Commonly known as:  NORCO/VICODIN  3 tablets every 4 (four) hours as needed. Take two tablets by mouth every four hours as needed for pain     pantoprazole 40 MG tablet  Commonly known as:  PROTONIX  Take 40 mg by mouth daily.     potassium chloride SA 20 MEQ tablet  Commonly known as:  K-DUR,KLOR-CON  Take 20 mEq by mouth 2 (two) times daily.     predniSONE 5 MG tablet  Commonly known as:  DELTASONE  Take 7.5 mg by mouth daily with breakfast.     promethazine 25 MG suppository  Commonly known as:  PHENERGAN  Place 25 mg rectally every 6 (six) hours as needed for nausea or vomiting.     rOPINIRole 1 MG tablet  Commonly known as:  REQUIP  Take 1 mg by mouth at bedtime.     senna-docusate 8.6-50 MG per tablet  Commonly known as:  Senokot-S  Take 1 tablet by mouth 2 (two) times daily as needed.     traZODone 50 MG tablet  Commonly known as:  DESYREL  Take 50-150 mg by mouth at bedtime as needed for sleep.     vancomycin 750 mg in sodium chloride 0.9 % 150 mL  Inject 750 mg into the vein every 12 (twelve) hours.        Meds ordered this encounter  Medications  . vancomycin 750 mg in sodium chloride 0.9 % 150 mL    Sig: Inject 750 mg into the vein every 12 (twelve) hours.  Marland Kitchen HYDROcodone-acetaminophen (NORCO/VICODIN) 5-325 MG per tablet    Sig: 3 tablets every 4 (four) hours as needed. Take two tablets by mouth every four hours as needed for pain    Immunization History  Administered Date(s) Administered  . Influenza Split 01/08/2012  . Influenza,inj,Quad PF,36+ Mos 01/01/2013  . Pneumococcal Polysaccharide-23 02/08/2008  . Td 02/07/2006    History  Substance Use Topics  . Smoking status: Never Smoker   . Smokeless  tobacco: Never Used  . Alcohol Use: No    Filed Vitals:  08/30/13 1231  BP: 115/63  Pulse: 75  Temp: 98.3 F (36.8 C)  Resp: 20    Physical Exam  GENERAL APPEARANCE: Alert, conversant. Appropriately groomed. No acute distress.  HEENT: Unremarkable. RESPIRATORY: Breathing is even, unlabored. Lung sounds are clear   CARDIOVASCULAR: Heart RRR no murmurs, rubs or gallops. No peripheral edema.  GASTROINTESTINAL: Abdomen is soft, non-tender, not distended w/ normal bowel sounds.  NEUROLOGIC: Cranial nerves 2-12 grossly intact. Moves all extremities no tremor.  Patient Active Problem List   Diagnosis Date Noted  . Neuropathy 08/28/2013  . Hypertension   . Cellulitis of left upper arm 08/24/2013  . Cellulitis and abscess of leg 08/22/2013  . Hypokalemia 08/22/2013  . Anemia of other chronic disease 12/19/2012  . Edema 12/19/2012  . Syncope 01/11/2012  . Hyponatremia 12/17/2011  . Abscess or cellulitis of chest wall 02/15/2011  . Oral ulcer 02/09/2011  . Cellulitis 02/09/2011  . SLE-on prednisone-Follow at [facility] 02/05/2011  . DVT L IJ 12/26/10 02/04/2011  . Normocytic anemia 12/27/2010  . Immunocompromised patient 12/19/2010  . Stage IV breast cancer in remission 09/07/2009  . DIABETES MELLITUS, TYPE II 09/07/2009  . HYPERLIPIDEMIA 09/07/2009  . ANEMIA-IRON DEFICIENCY 09/07/2009  . Chronic pain syndrome 09/07/2009  . MIGRAINE HEADACHE 09/07/2009  . ALLERGIC RHINITIS 09/07/2009  . GERD 09/07/2009  . SEIZURE DISORDER 09/07/2009  . PERSONALITY DISORDER 09/01/2009  . DEPRESSION 09/01/2009  . RENAL INSUFFICIENCY 09/01/2009    CBC   7/23  WBC 5.4  10.7/36.2  PLT 155    Component Value Date/Time   WBC 8.1 08/26/2013 0430   WBC 10.7* 04/08/2013 1148   RBC 3.47* 08/26/2013 0430   RBC 3.83 04/08/2013 1148   RBC 4.52 05/05/2009 1211   HGB 10.3* 08/26/2013 0430   HGB 8.4* 04/08/2013 1148   HCT 33.4* 08/26/2013 0430   HCT 27.7* 04/08/2013 1148   PLT 171 08/26/2013 0430   PLT 289  04/08/2013 1148   MCV 96.3 08/26/2013 0430   MCV 72.4* 04/08/2013 1148   LYMPHSABS 0.6* 04/08/2013 1148   LYMPHSABS 1.9 02/12/2013 0352   MONOABS 0.6 04/08/2013 1148   MONOABS 0.8 02/12/2013 0352   EOSABS 0.1 04/08/2013 1148   EOSABS 0.1 02/12/2013 0352   BASOSABS 0.1 04/08/2013 1148   BASOSABS 0.1 02/12/2013 0352    CMP 7/23  143,3.8, 102,32, 13/0.8, 94  LFT's- nl except TP 5.8  GFR >60    Component Value Date/Time   NA 143 08/26/2013 0430   NA 135 04/25/2013 1439   NA 135* 04/08/2013 1148   K 3.9 08/26/2013 0430   K 3.5 04/25/2013 1439   K 4.6 04/08/2013 1148   CL 102 08/26/2013 0430   CL 90* 04/25/2013 1439   CL 91* 03/15/2012 1410   CO2 31 08/26/2013 0430   CO2 32 04/25/2013 1439   CO2 28 04/08/2013 1148   GLUCOSE 87 08/26/2013 0430   GLUCOSE 201* 04/25/2013 1439   GLUCOSE 268* 04/08/2013 1148   GLUCOSE 131* 03/15/2012 1410   BUN 16 08/26/2013 0430   BUN 26* 04/25/2013 1439   BUN 29.9* 04/08/2013 1148   CREATININE 0.69 08/26/2013 0430   CREATININE 0.9 04/25/2013 1439   CREATININE 1.2* 04/08/2013 1148   CALCIUM 8.5 08/26/2013 0430   CALCIUM 9.9 04/25/2013 1439   CALCIUM 9.6 04/08/2013 1148   PROT 6.1 08/26/2013 0430   PROT 8.5* 04/25/2013 1439   PROT 6.9 04/08/2013 1148   ALBUMIN 3.0* 08/26/2013 0430   ALBUMIN 3.4* 04/08/2013 1148  AST 14 08/26/2013 0430   AST 21 04/25/2013 1439   AST 17 04/08/2013 1148   ALT 8 08/26/2013 0430   ALT 36 04/25/2013 1439   ALT 18 04/08/2013 1148   ALKPHOS 85 08/26/2013 0430   ALKPHOS 102* 04/25/2013 1439   ALKPHOS 100 04/08/2013 1148   BILITOT <0.2* 08/26/2013 0430   BILITOT 0.70 04/25/2013 1439   BILITOT 0.34 04/08/2013 1148   GFRNONAA >90 08/26/2013 0430   GFRAA >90 08/26/2013 0430    Assessment and Plan  Pt is having acute emotional distress over not being home. I have discussed her with PT and social worker and we all feel  her going home is the best plan. She will have nursing, HH/OT/PT. She is knowledgible and motivated.  Hennie Duos, MD

## 2013-08-31 ENCOUNTER — Encounter: Payer: Self-pay | Admitting: Internal Medicine

## 2013-09-10 ENCOUNTER — Other Ambulatory Visit (HOSPITAL_COMMUNITY): Payer: Self-pay | Admitting: Family Medicine

## 2013-09-10 ENCOUNTER — Ambulatory Visit (HOSPITAL_COMMUNITY)
Admission: RE | Admit: 2013-09-10 | Discharge: 2013-09-10 | Disposition: A | Discharge status: Home / Self Care | Payer: Medicare Other | Source: Ambulatory Visit | Attending: Family Medicine | Admitting: Family Medicine

## 2013-09-10 ENCOUNTER — Other Ambulatory Visit: Payer: Self-pay | Admitting: Internal Medicine

## 2013-09-10 ENCOUNTER — Telehealth: Payer: Self-pay | Admitting: *Deleted

## 2013-09-10 DIAGNOSIS — M25521 Pain in right elbow: Secondary | ICD-10-CM

## 2013-09-10 DIAGNOSIS — M25529 Pain in unspecified elbow: Secondary | ICD-10-CM | POA: Insufficient documentation

## 2013-09-10 DIAGNOSIS — M79609 Pain in unspecified limb: Secondary | ICD-10-CM

## 2013-09-10 NOTE — Telephone Encounter (Signed)
Called the patient and gave her an appt for the ultra sound for 09/10/13 at 2 pm. She advised she will make that appt and asked what to do afterwards as she lives in [facility] and does not want to go all the way home and have to come back the have the PICC removed. Advised her the doctor should get the call while she is there and he will advise prior to her leaving. She was happy with that answer.

## 2013-09-10 NOTE — Progress Notes (Signed)
VASCULAR LAB PRELIMINARY  PRELIMINARY  PRELIMINARY  PRELIMINARY  Right upper extremity venous duplex completed.    Preliminary report:  Right:  No evidence of DVT or superficial thrombosis.    Jaquia Benedicto, RVT 09/10/2013, 3:00 PM

## 2013-09-10 NOTE — Telephone Encounter (Signed)
Order sent.

## 2013-09-10 NOTE — Telephone Encounter (Signed)
Patient called and advised that her PICC site is painful. She advised she has no fever or chills but that her home health nurse advised her to call the office and let us know. She advised she has only had the dressing changed once since she left the hospital and she is afraid of a blood clot. Advised her will let the doctor know what is going on and put her on hold. The patient has an appt tomorrow 09/11/13 with Dr Linus Salmons.  Spoke with Dr Linus Salmons and he advised to have the patient to have an ultra sound on the area and we will call her once we get the report.   Patient is aware and I will call her once I get the scan scheduled.

## 2013-09-10 NOTE — Telephone Encounter (Signed)
Results called from Echo Lab and the patient was negative for DVT or superficial thrombosis. Dr Linus Salmons was advised and told the patient that we will see her tomorrow and change dressing or pull PICC then.

## 2013-09-11 ENCOUNTER — Encounter: Payer: Self-pay | Admitting: Internal Medicine

## 2013-09-11 ENCOUNTER — Telehealth: Payer: Self-pay | Admitting: *Deleted

## 2013-09-11 ENCOUNTER — Ambulatory Visit (INDEPENDENT_AMBULATORY_CARE_PROVIDER_SITE_OTHER): Payer: Medicare Other | Admitting: Internal Medicine

## 2013-09-11 VITALS — BP 159/85 | HR 89 | Temp 98.8°F | Wt 188.0 lb

## 2013-09-11 DIAGNOSIS — L03119 Cellulitis of unspecified part of limb: Principal | ICD-10-CM

## 2013-09-11 DIAGNOSIS — L02419 Cutaneous abscess of limb, unspecified: Secondary | ICD-10-CM

## 2013-09-11 NOTE — Telephone Encounter (Signed)
[facility] notified that patient's picc line was pulled here in the clinic. They supplied the IV antibiotics. [place] was providing nursing care and they did not draw any labs on patient or routinely change her picc dressing. [facility] regarding this and the nurse manager, Jackelyn Poling said she would need to look into this and call me back. No call was received prior to the end of the day. Call was placed at 3:30 pm. Myrtis Hopping

## 2013-09-11 NOTE — Progress Notes (Signed)
RN received verbal order to discontinue the patient's PICC line.  Patient identified with name and date of birth. PICC dressing removed, site unremarkable.  PICC line removed using sterile procedure @ 1615. PICC length equal to that noted in patient's hospital chart of 45 cm. Sterile petroleum gauze + sterile 4X4 applied to PICC site, pressure applied for 10 minutes and covered with Medipore tape as a pressure dressing. Patient tolerated procedure without complaints.  Patient instructed to limit use of arm for 1 hour. Patient instructed that the pressure dressing should remain in place for 24 hours. Patient verbalized understanding of these instructions

## 2013-09-11 NOTE — Telephone Encounter (Signed)
[facility] appt 09/11/13 w/ Dr. Linus Salmons.  [facility] does not have a stop date for the pt's IV Vancomycin.  Please inform [facility] to stop date after OV today.

## 2013-09-11 NOTE — Assessment & Plan Note (Signed)
No further signs of infection.  Will pull picc, stop antibiotics.  rtc prn

## 2013-09-11 NOTE — Progress Notes (Signed)
   Subjective:    Patient ID: [name] Carr, female    DOB: 1955-12-05, 58 y.o.   MRN: [id number]  HPI [name] Carr is a 58 y.o. female with SLE on prednisone, and a history of recurrent MRSA Cellulitis who presented to the [facility] ED with complaints of fever today to 101, with progressive redness and swelling of her left thigh. She recently was hospitalized at [facility] in early July for cellulitis on her left shoulder which is still infected despite treatment with antibiotics off and on for the past 3 months. She states that she has difficulty with taking oral antibiotics curing her skin infection, but has taken them as instructed. She recently finished a course of levofloxacin . She underwent an I+D was done at the [facility] and she was placed on IV Vancomycin and cefazolin transferred to [facility] for admission. No cultures have been taken from recent I x D.she underwent repeat imaging of leg that did not show any further deep tissue abscess. Due to hsitory of oral meds, she was started on ceftaroline and picc placed.  She was to get that for 2 further weeks.  She went to a SNF and due to cost issues changed to vancomycin.  No fever, no chills.  Leg with packing.      Review of Systems  Constitutional: Negative for fever and chills.  Gastrointestinal: Negative for diarrhea.  Skin: Positive for wound. Negative for rash.       Objective:   Physical Exam  Constitutional: She appears well-developed and well-nourished.  Eyes: No scleral icterus.  Musculoskeletal:  Left thigh with 2 small openings with packing, no drainage, no surrounding erythema.  Noted induration between open areas about 2-3 cm, firm, mild tenderness but no fluctuance, no swelling          Assessment & Plan:

## 2013-09-12 ENCOUNTER — Encounter: Payer: Self-pay | Admitting: Hematology & Oncology

## 2013-09-12 ENCOUNTER — Telehealth: Payer: Self-pay | Admitting: *Deleted

## 2013-09-12 ENCOUNTER — Other Ambulatory Visit (HOSPITAL_BASED_OUTPATIENT_CLINIC_OR_DEPARTMENT_OTHER): Payer: Medicare Other | Admitting: Lab

## 2013-09-12 ENCOUNTER — Ambulatory Visit (HOSPITAL_BASED_OUTPATIENT_CLINIC_OR_DEPARTMENT_OTHER): Payer: Medicare Other | Admitting: Hematology & Oncology

## 2013-09-12 ENCOUNTER — Telehealth: Payer: Self-pay | Admitting: Hematology & Oncology

## 2013-09-12 VITALS — BP 142/79 | HR 82 | Temp 97.9°F | Resp 16 | Ht 64.0 in | Wt 185.0 lb

## 2013-09-12 DIAGNOSIS — C787 Secondary malignant neoplasm of liver and intrahepatic bile duct: Secondary | ICD-10-CM

## 2013-09-12 DIAGNOSIS — D509 Iron deficiency anemia, unspecified: Secondary | ICD-10-CM

## 2013-09-12 DIAGNOSIS — C50919 Malignant neoplasm of unspecified site of unspecified female breast: Secondary | ICD-10-CM

## 2013-09-12 DIAGNOSIS — I878 Other specified disorders of veins: Secondary | ICD-10-CM

## 2013-09-12 LAB — CBC WITH DIFFERENTIAL (CANCER CENTER ONLY)
BASO#: 0.1 10*3/uL (ref 0.0–0.2)
BASO%: 0.7 % (ref 0.0–2.0)
EOS ABS: 0 10*3/uL (ref 0.0–0.5)
EOS%: 0.4 % (ref 0.0–7.0)
HEMATOCRIT: 42.2 % (ref 34.8–46.6)
HEMOGLOBIN: 13.9 g/dL (ref 11.6–15.9)
LYMPH#: 1.1 10*3/uL (ref 0.9–3.3)
LYMPH%: 11 % — AB (ref 14.0–48.0)
MCH: 30.4 pg (ref 26.0–34.0)
MCHC: 32.9 g/dL (ref 32.0–36.0)
MCV: 92 fL (ref 81–101)
MONO#: 0.5 10*3/uL (ref 0.1–0.9)
MONO%: 4.8 % (ref 0.0–13.0)
NEUT#: 8.3 10*3/uL — ABNORMAL HIGH (ref 1.5–6.5)
NEUT%: 83.1 % — ABNORMAL HIGH (ref 39.6–80.0)
Platelets: 183 10*3/uL (ref 145–400)
RBC: 4.57 10*6/uL (ref 3.70–5.32)
RDW: 14.3 % (ref 11.1–15.7)
WBC: 9.9 10*3/uL (ref 3.9–10.0)

## 2013-09-12 LAB — COMPLETE METABOLIC PANEL WITH GFR
ALK PHOS: 86 U/L (ref 39–117)
ALT: 18 U/L (ref 0–35)
AST: 20 U/L (ref 0–37)
Albumin: 4.5 g/dL (ref 3.5–5.2)
BUN: 16 mg/dL (ref 6–23)
CO2: 30 meq/L (ref 19–32)
Calcium: 9.3 mg/dL (ref 8.4–10.5)
Chloride: 91 mEq/L — ABNORMAL LOW (ref 96–112)
Creat: 0.94 mg/dL (ref 0.50–1.10)
GFR, EST AFRICAN AMERICAN: 77 mL/min
GFR, Est Non African American: 67 mL/min
Glucose, Bld: 126 mg/dL — ABNORMAL HIGH (ref 70–99)
Potassium: 3.2 mEq/L — ABNORMAL LOW (ref 3.5–5.3)
SODIUM: 135 meq/L (ref 135–145)
TOTAL PROTEIN: 7.3 g/dL (ref 6.0–8.3)
Total Bilirubin: 0.5 mg/dL (ref 0.2–1.2)

## 2013-09-12 LAB — IRON AND TIBC
%SAT: 21 % (ref 20–55)
Iron: 64 ug/dL (ref 42–145)
TIBC: 302 ug/dL (ref 250–470)
UIBC: 238 ug/dL (ref 125–400)

## 2013-09-12 LAB — COMPREHENSIVE METABOLIC PANEL
ALBUMIN: 4.8 g/dL (ref 3.5–5.2)
ALT: 24 U/L (ref 0–35)
AST: 24 U/L (ref 0–37)
Alkaline Phosphatase: 99 U/L (ref 39–117)
BUN: 17 mg/dL (ref 6–23)
CHLORIDE: 92 meq/L — AB (ref 96–112)
CO2: 29 mEq/L (ref 19–32)
Calcium: 9.9 mg/dL (ref 8.4–10.5)
Creatinine, Ser: 1.03 mg/dL (ref 0.50–1.10)
Glucose, Bld: 124 mg/dL — ABNORMAL HIGH (ref 70–99)
POTASSIUM: 3.1 meq/L — AB (ref 3.5–5.3)
Sodium: 134 mEq/L — ABNORMAL LOW (ref 135–145)
Total Bilirubin: 0.6 mg/dL (ref 0.2–1.2)
Total Protein: 7.6 g/dL (ref 6.0–8.3)

## 2013-09-12 LAB — TECHNOLOGIST REVIEW CHCC SATELLITE

## 2013-09-12 LAB — CANCER ANTIGEN 27.29: CA 27.29: 55 U/mL — ABNORMAL HIGH (ref 0–39)

## 2013-09-12 LAB — FERRITIN: FERRITIN: 478 ng/mL — AB (ref 10–291)

## 2013-09-12 MED ORDER — FLUCONAZOLE 100 MG PO TABS: 100.0000 mg | ORAL_TABLET | Freq: Every day | ORAL | Status: DC

## 2013-09-12 NOTE — Telephone Encounter (Signed)
[name] with [facility] called back stating that it was patient's noncompliance that interfered with obtaining appropriate lab draws. She was asked not to administer the antibiotic until the labs were drawn and each time nursing went out she had already administered her dose. She also told nursing she was not taking her nightly dose of vancomycin because she was tired. In regards to the dressing change the day nursing went out to change it, the patient stated she thought her picc line was infected and nursing advised her to go to the ED. Patient declined to do this. This account is entirely different than what was told by the patient.

## 2013-09-12 NOTE — Telephone Encounter (Signed)
Left message asking patient to call Dr. Henreitta Leber office.  Patient will need to follow up with her PCP regarding her low potassium levels.  Will route to PCP listed on her chart. Landis Gandy, RN

## 2013-09-12 NOTE — Telephone Encounter (Signed)
error 

## 2013-09-12 NOTE — Telephone Encounter (Signed)
Message copied by Landis Gandy on Thu Sep 12, 2013 10:11 AM ------      Message from: Thayer Headings      Created: Thu Sep 12, 2013  8:40 AM       Please let her know her potassium was just a bit low and just needs to keep an eye on it.  It was 3.2.  thanks ------

## 2013-09-12 NOTE — Telephone Encounter (Signed)
Dr. Marin Olp aware pt refused to schedule [place] said her PCP will do it and she see's them next Friday. MD rescinded port order

## 2013-09-13 NOTE — Telephone Encounter (Signed)
Spoke with patient regarding her low potassium, she will contact her pcp Dr. Ellouise Newer so she can increase her potassium medication

## 2013-09-15 NOTE — Progress Notes (Signed)
Hematology and Oncology Follow Up Visit  [name] Carr [id number] 1955/02/12 58 y.o. 09/15/2013   Principle Diagnosis:   Metastatic breast cancer  Current Therapy:    Observation     Interim History:  [name] Carr is in for her first office visit. She was producing followed by Dr. Beryle Beams. He last saw her back in March. She has a very unusual history. She is metastatic breast cancer. She presented in July of 2011. She had a liver metastases. She was treated with Cytoxan and Taxotere for 3 cycles. She then had a Braxton am. She finished this in March of 2012. She declined any further therapy.  She apparently was wheezing at the oncology group and [place]. They let her go because of "drug-seeking behavior". She then saw Dr. Beryle Beams. She was given a short followup Xeloda which she tolerated poorly.  She has not been compliant with appointments. She sees quite a few doctors. She has multiple complaints. She has somewhat collagen vascular issue. She is on quite a few medications.  She is not having any problems going to the bathroom. She's had some occasional leg swelling. She's had no rashes. She's had no cough.  She had a CT of the left femur back in July. There is no obvious metastatic disease noted.  A chest x-ray was done in July. This is for a PICC line. The PICC line is not in . I am not sure  why she had PICC line.      Medications: Current outpatient prescriptions:acetaminophen (TYLENOL) 500 MG tablet, Take 1,000 mg by mouth every 6 (six) hours as needed (pain)., Disp: , Rfl: ;  alum & mag hydroxide-simeth (MAALOX/MYLANTA) 200-200-20 MG/5ML suspension, Take 30 mLs by mouth every 6 (six) hours as needed for indigestion or heartburn (dyspepsia)., Disp: 355 mL, Rfl: 0 bisacodyl (BISACODYL) 5 MG EC tablet, Take 5 mg by mouth daily as needed for moderate constipation., Disp: , Rfl: ;  carisoprodol (SOMA) 350 MG tablet, Take one tablet by mouth three times daily; Take one tablet  by mouth at bedtime, Disp: 120 tablet, Rfl: 0;  clonazePAM (KLONOPIN) 1 MG tablet, Take one tablet by mouth twice daily, Disp: 60 tablet, Rfl: 5 docusate sodium (COLACE) 100 MG capsule, Take 200 mg by mouth 2 (two) times daily. , Disp: , Rfl: ;  DULoxetine (CYMBALTA) 60 MG capsule, Take 1 capsule (60 mg total) by mouth 2 (two) times daily., Disp: 180 capsule, Rfl: 1;  EPINEPHrine (EPIPEN) 0.3 mg/0.3 mL IJ SOAJ injection, Inject 0.3 mg into the muscle once., Disp: , Rfl: ;  furosemide (LASIX) 20 MG tablet, Take 40 mg by mouth daily. , Disp: , Rfl:  gabapentin (NEURONTIN) 300 MG capsule, Take 3 capsules (900 mg total) by mouth 3 (three) times daily., Disp: 810 capsule, Rfl: 0;  hydrochlorothiazide (HYDRODIURIL) 25 MG tablet, Take 25 mg by mouth daily as needed (for lower leg edema). , Disp: , Rfl: ;  HYDROcodone-acetaminophen (NORCO/VICODIN) 5-325 MG per tablet, 3 tablets every 4 (four) hours as needed. Take two tablets by mouth every four hours as needed for pain, Disp: , Rfl:  pantoprazole (PROTONIX) 40 MG tablet, Take 40 mg by mouth daily., Disp: , Rfl: ;  potassium chloride SA (K-DUR,KLOR-CON) 20 MEQ tablet, Take 20 mEq by mouth 2 (two) times daily. , Disp: , Rfl: ;  predniSONE (DELTASONE) 5 MG tablet, Take 7.5 mg by mouth daily with breakfast., Disp: , Rfl: ;  promethazine (PHENERGAN) 25 MG suppository, Place 25 mg rectally  every 6 (six) hours as needed for nausea or vomiting., Disp: , Rfl:  rOPINIRole (REQUIP) 1 MG tablet, Take 1 mg by mouth at bedtime., Disp: , Rfl: ;  senna-docusate (SENOKOT-S) 8.6-50 MG per tablet, Take 1 tablet by mouth 2 (two) times daily as needed. , Disp: , Rfl: ;  traZODone (DESYREL) 50 MG tablet, Take 50-150 mg by mouth at bedtime as needed for sleep., Disp: , Rfl: ;  fluconazole (DIFLUCAN) 100 MG tablet, Take 1 tablet (100 mg total) by mouth daily., Disp: 10 tablet, Rfl: 4  Allergies:  Allergies  Allergen Reactions  . Ammonia Other (See Comments)    Ended up on ventilator  from ammonia tabs  . Contrast Media [Iodinated Diagnostic Agents] Other (See Comments)    Doesn't breath well.  . Iohexol Other (See Comments)    SOB   . Methotrexate Anaphylaxis  . Midazolam Hcl Anaphylaxis  . Nalbuphine Other (See Comments)    Can't breathe well  . Infliximab Nausea And Vomiting  . Ketorolac Tromethamine Other (See Comments)    Injectable doesn't work and pill hurts stomach.  . Nsaids Nausea And Vomiting  . Celecoxib Rash    Past Medical History, Surgical history, Social history, and Family History were reviewed and updated.  Review of Systems: As above  Physical Exam:  height is 5\' 4"  (1.626 m) and weight is 185 lb (83.915 kg). Her oral temperature is 97.9 F (36.6 C). Her blood pressure is 142/79 and her pulse is 82. Her respiration is 16.   Mildly obese white female. Head and neck exam shows no ocular or oral lesions. She has no palpable cervical or supraclavicular lymph nodes. Lungs are clear. Cardiac exam regular in rhythm. Abdomen is soft. She is no fluid wave. There is no palpable liver or spleen tip. This will exam shows bilateral mastectomies. No chest wall nodules, erythema or swelling is noted. There is no bilateral axillary adenopathy. Back exam no tenderness over the spine ribs or hips. Extremities shows no clubbing cyanosis or edema. Skin exam no rashes. Neurological exam is nonfocal.    Lab Results  Component Value Date   WBC 9.9 09/12/2013   HGB 13.9 09/12/2013   HCT 42.2 09/12/2013   MCV 92 09/12/2013   PLT 183 09/12/2013     Chemistry      Component Value Date/Time   NA 134* 09/12/2013 1603   NA 135 04/25/2013 1439   NA 135* 04/08/2013 1148   K 3.1* 09/12/2013 1603   K 3.5 04/25/2013 1439   K 4.6 04/08/2013 1148   CL 92* 09/12/2013 1603   CL 90* 04/25/2013 1439   CL 91* 03/15/2012 1410   CO2 29 09/12/2013 1603   CO2 32 04/25/2013 1439   CO2 28 04/08/2013 1148   BUN 17 09/12/2013 1603   BUN 26* 04/25/2013 1439   BUN 29.9* 04/08/2013 1148   CREATININE 1.03  09/12/2013 1603   CREATININE 0.94 09/11/2013 1718   CREATININE 1.2* 04/08/2013 1148      Component Value Date/Time   CALCIUM 9.9 09/12/2013 1603   CALCIUM 9.9 04/25/2013 1439   CALCIUM 9.6 04/08/2013 1148   ALKPHOS 99 09/12/2013 1603   ALKPHOS 102* 04/25/2013 1439   ALKPHOS 100 04/08/2013 1148   AST 24 09/12/2013 1603   AST 21 04/25/2013 1439   AST 17 04/08/2013 1148   ALT 24 09/12/2013 1603   ALT 36 04/25/2013 1439   ALT 18 04/08/2013 1148   BILITOT 0.6 09/12/2013 1603  BILITOT 0.70 04/25/2013 1439   BILITOT 0.34 04/08/2013 1148     Ferritin is 478. Iron saturation is 21%.  CA 27.29 is a 55.    Impression and Plan: [name] Carr is 58 year old female with metastatic breast cancer. She has not had any therapy for 3 years. Still a medicine that she looks as good.  I think that an ultrasound of the abdomen would be helpful. We can see what call with her liver.  She has really poor IV access. We will see about getting a port placed.  We will plan to get her back to see [place] in another month.  I must say, that she does have a very unique history. I spent about 45 minutes with her.     Volanda Napoleon, MD 8/9/20158:46 AM

## 2013-09-23 ENCOUNTER — Telehealth: Payer: Self-pay

## 2013-09-23 NOTE — Telephone Encounter (Signed)
Received call from pt requesting to have PAC placed by Dr Ronny Bacon at [facility] instead of at [facility] due to previous complications with anesthesia at [facility]. OK per Dr Marin Olp.   Referral made to Dr Christian Mate at [place]. Appt made for tomorrow 8/18 at 2:15pm. Office visit and demographic information faxed to Dr Forest Becker office.  Phone - [phone]  Fax - [phone]  dph

## 2013-09-24 ENCOUNTER — Telehealth: Payer: Self-pay | Admitting: Hematology & Oncology

## 2013-09-24 NOTE — Telephone Encounter (Signed)
Patient called and cx 10/07/13 apt and resch for 11/07/13.  There were other apts available before the 11/07/13 date but patient wants a afternoon apt.  Patient will talk to RN about apt tomorrow

## 2013-09-30 ENCOUNTER — Other Ambulatory Visit: Payer: Self-pay | Admitting: Internal Medicine

## 2013-10-07 ENCOUNTER — Other Ambulatory Visit: Payer: Self-pay | Admitting: Lab

## 2013-10-07 ENCOUNTER — Ambulatory Visit: Payer: Self-pay | Admitting: Hematology & Oncology

## 2013-11-06 ENCOUNTER — Telehealth: Payer: Self-pay | Admitting: Hematology & Oncology

## 2013-11-06 NOTE — Telephone Encounter (Signed)
Pt moved 10-1 to 11-4

## 2013-11-07 ENCOUNTER — Other Ambulatory Visit: Payer: Self-pay | Admitting: Lab

## 2013-11-07 ENCOUNTER — Ambulatory Visit: Payer: Self-pay | Admitting: Hematology & Oncology

## 2013-12-10 ENCOUNTER — Telehealth: Payer: Self-pay | Admitting: Hematology & Oncology

## 2013-12-10 NOTE — Telephone Encounter (Signed)
Patient called and cx 12/11/13 and she stated she will call back to resch

## 2013-12-11 ENCOUNTER — Other Ambulatory Visit: Payer: Self-pay | Admitting: Lab

## 2013-12-11 ENCOUNTER — Ambulatory Visit: Payer: Self-pay | Admitting: Hematology & Oncology

## 2013-12-14 ENCOUNTER — Other Ambulatory Visit: Payer: Self-pay | Admitting: Internal Medicine

## 2013-12-20 ENCOUNTER — Inpatient Hospital Stay (HOSPITAL_BASED_OUTPATIENT_CLINIC_OR_DEPARTMENT_OTHER)
Admission: EM | Admit: 2013-12-20 | Discharge: 2013-12-29 | DRG: 603 | Disposition: A | Discharge status: Home Health | Payer: Medicare Other | Attending: Internal Medicine | Admitting: Internal Medicine

## 2013-12-20 ENCOUNTER — Encounter (HOSPITAL_BASED_OUTPATIENT_CLINIC_OR_DEPARTMENT_OTHER): Payer: Self-pay

## 2013-12-20 DIAGNOSIS — M7989 Other specified soft tissue disorders: Secondary | ICD-10-CM | POA: Diagnosis not present

## 2013-12-20 DIAGNOSIS — M3500 Sicca syndrome, unspecified: Secondary | ICD-10-CM | POA: Diagnosis present

## 2013-12-20 DIAGNOSIS — F419 Anxiety disorder, unspecified: Secondary | ICD-10-CM | POA: Diagnosis present

## 2013-12-20 DIAGNOSIS — L0291 Cutaneous abscess, unspecified: Secondary | ICD-10-CM

## 2013-12-20 DIAGNOSIS — I10 Essential (primary) hypertension: Secondary | ICD-10-CM | POA: Diagnosis present

## 2013-12-20 DIAGNOSIS — M329 Systemic lupus erythematosus, unspecified: Secondary | ICD-10-CM | POA: Diagnosis present

## 2013-12-20 DIAGNOSIS — Z7952 Long term (current) use of systemic steroids: Secondary | ICD-10-CM

## 2013-12-20 DIAGNOSIS — E785 Hyperlipidemia, unspecified: Secondary | ICD-10-CM | POA: Diagnosis present

## 2013-12-20 DIAGNOSIS — D849 Immunodeficiency, unspecified: Secondary | ICD-10-CM | POA: Diagnosis present

## 2013-12-20 DIAGNOSIS — K219 Gastro-esophageal reflux disease without esophagitis: Secondary | ICD-10-CM | POA: Diagnosis present

## 2013-12-20 DIAGNOSIS — C50919 Malignant neoplasm of unspecified site of unspecified female breast: Secondary | ICD-10-CM

## 2013-12-20 DIAGNOSIS — E876 Hypokalemia: Secondary | ICD-10-CM | POA: Diagnosis present

## 2013-12-20 DIAGNOSIS — L03116 Cellulitis of left lower limb: Principal | ICD-10-CM | POA: Diagnosis present

## 2013-12-20 DIAGNOSIS — M069 Rheumatoid arthritis, unspecified: Secondary | ICD-10-CM | POA: Diagnosis present

## 2013-12-20 DIAGNOSIS — E039 Hypothyroidism, unspecified: Secondary | ICD-10-CM | POA: Diagnosis present

## 2013-12-20 DIAGNOSIS — L02419 Cutaneous abscess of limb, unspecified: Secondary | ICD-10-CM

## 2013-12-20 DIAGNOSIS — R609 Edema, unspecified: Secondary | ICD-10-CM

## 2013-12-20 DIAGNOSIS — Z79899 Other long term (current) drug therapy: Secondary | ICD-10-CM

## 2013-12-20 DIAGNOSIS — E271 Primary adrenocortical insufficiency: Secondary | ICD-10-CM | POA: Diagnosis present

## 2013-12-20 DIAGNOSIS — E871 Hypo-osmolality and hyponatremia: Secondary | ICD-10-CM

## 2013-12-20 DIAGNOSIS — G629 Polyneuropathy, unspecified: Secondary | ICD-10-CM

## 2013-12-20 DIAGNOSIS — G40909 Epilepsy, unspecified, not intractable, without status epilepticus: Secondary | ICD-10-CM | POA: Diagnosis present

## 2013-12-20 DIAGNOSIS — D649 Anemia, unspecified: Secondary | ICD-10-CM

## 2013-12-20 DIAGNOSIS — G894 Chronic pain syndrome: Secondary | ICD-10-CM | POA: Diagnosis present

## 2013-12-20 DIAGNOSIS — K121 Other forms of stomatitis: Secondary | ICD-10-CM

## 2013-12-20 DIAGNOSIS — L03114 Cellulitis of left upper limb: Secondary | ICD-10-CM

## 2013-12-20 DIAGNOSIS — E119 Type 2 diabetes mellitus without complications: Secondary | ICD-10-CM | POA: Diagnosis present

## 2013-12-20 DIAGNOSIS — L0390 Cellulitis, unspecified: Secondary | ICD-10-CM

## 2013-12-20 DIAGNOSIS — L03119 Cellulitis of unspecified part of limb: Secondary | ICD-10-CM

## 2013-12-20 DIAGNOSIS — Z853 Personal history of malignant neoplasm of breast: Secondary | ICD-10-CM

## 2013-12-20 DIAGNOSIS — F329 Major depressive disorder, single episode, unspecified: Secondary | ICD-10-CM | POA: Diagnosis present

## 2013-12-20 DIAGNOSIS — E118 Type 2 diabetes mellitus with unspecified complications: Secondary | ICD-10-CM

## 2013-12-20 DIAGNOSIS — D899 Disorder involving the immune mechanism, unspecified: Secondary | ICD-10-CM

## 2013-12-20 LAB — BASIC METABOLIC PANEL
Anion gap: 12 (ref 5–15)
BUN: 18 mg/dL (ref 6–23)
CALCIUM: 8.8 mg/dL (ref 8.4–10.5)
CO2: 29 mEq/L (ref 19–32)
CREATININE: 1.3 mg/dL — AB (ref 0.50–1.10)
Chloride: 99 mEq/L (ref 96–112)
GFR calc Af Amer: 51 mL/min — ABNORMAL LOW (ref >90)
GFR, EST NON AFRICAN AMERICAN: 44 mL/min — AB (ref >90)
Glucose, Bld: 133 mg/dL — ABNORMAL HIGH (ref 70–99)
Potassium: 3.5 mEq/L — ABNORMAL LOW (ref 3.7–5.3)
Sodium: 140 mEq/L (ref 137–147)

## 2013-12-20 LAB — CBC WITH DIFFERENTIAL/PLATELET
BASOS PCT: 0 % (ref 0–1)
Basophils Absolute: 0 10*3/uL (ref 0.0–0.1)
EOS ABS: 0.2 10*3/uL (ref 0.0–0.7)
Eosinophils Relative: 2 % (ref 0–5)
HEMATOCRIT: 32.4 % — AB (ref 36.0–46.0)
Hemoglobin: 10.4 g/dL — ABNORMAL LOW (ref 12.0–15.0)
Lymphocytes Relative: 14 % (ref 12–46)
Lymphs Abs: 1.3 10*3/uL (ref 0.7–4.0)
MCH: 29.9 pg (ref 26.0–34.0)
MCHC: 32.1 g/dL (ref 30.0–36.0)
MCV: 93.1 fL (ref 78.0–100.0)
MONO ABS: 0.7 10*3/uL (ref 0.1–1.0)
MONOS PCT: 8 % (ref 3–12)
NEUTROS PCT: 76 % (ref 43–77)
Neutro Abs: 7.1 10*3/uL (ref 1.7–7.7)
Platelets: 192 10*3/uL (ref 150–400)
RBC: 3.48 MIL/uL — ABNORMAL LOW (ref 3.87–5.11)
RDW: 13.8 % (ref 11.5–15.5)
WBC: 9.3 10*3/uL (ref 4.0–10.5)

## 2013-12-20 MED ORDER — VANCOMYCIN HCL IN DEXTROSE 1-5 GM/200ML-% IV SOLN
1000.0000 mg | Freq: Once | INTRAVENOUS | Status: AC
  Administered 2013-12-21: 1000 mg via INTRAVENOUS
  Filled 2013-12-20: qty 200

## 2013-12-20 MED ORDER — ACETAMINOPHEN 500 MG PO TABS
1000.0000 mg | ORAL_TABLET | Freq: Once | ORAL | Status: AC
  Administered 2013-12-21: 1000 mg via ORAL
  Filled 2013-12-20: qty 2

## 2013-12-20 NOTE — ED Provider Notes (Signed)
CSN: [id number]     Arrival date & time 12/20/13  2155 History  This chart was scribed for Artis Delay, MD by Starleen Arms, ED Scribe. This patient was seen in room MH06/MH06 and the patient's care was started at 10:37 PM.   Chief Complaint  Patient presents with  . Cellulitis   The history is provided by the patient. No language interpreter was used.   HPI Comments: [name] Carr is a 58 y.o. female with a history of recurrent MRSA who presents to the Emergency Department complaining of intermittent redness, warmth and hardness for 3 days on her upper left thigh.  Patient was admitted on 7/15 for cellulitis in the same location that required I&D and IV antibiotics.  Patient reports a fever TMAX 101 this morning.  She reports taking Vicodin this morning with improvement in pain.  She reports chills and diaphoresis last night.  Patient reports that she cannot tolerate oral antibiotics  Pain described as severe, burning and crampy in nature.     PCP: Dr. Ellouise Newer at [facility] Past Medical History  Diagnosis Date  . PERSONALITY DISORDER   . Chronic pain syndrome     chronic narcotics, dependancy hx - [place] for pain mgmt  . MIGRAINE HEADACHE   . Lyme disease   . ALLERGIC RHINITIS   . ANEMIA-IRON DEFICIENCY   . HYPERLIPIDEMIA   . GERD   . DIABETES MELLITUS, TYPE II   . DEPRESSION   . SLE (systemic lupus erythematosus)     [facility] - chronic pred  . Psoriasis   . Addison's disease   . Degenerative joint disease   . DDD (degenerative disc disease)   . Phlebitis after infusion     left ?calf; "S/P phenergan/demerol injection"  . Blood clot in vein 2011; 02/04/11    "right jugular vein;left jugular vein"  . SEIZURE DISORDER     "lots of seizures; due to lupus"  . Anxiety   . Neuropathy     hands, feet, due to diabetes & back problem  . Surgical wound infection 02/15/2011    R shoulder and mastectomy site - related to PICC  . CARCINOMA, BREAST 08/2009 dx     R breast,  mets to liver - resolution s/p chemo, s/p B mastect  . Metastases to the liver   . RA (rheumatoid arthritis)   . Hypothyroidism pt states she has never had hy pothyroidism  . Hypertension    Past Surgical History  Procedure Laterality Date  . Liver bopsy  09/2009  . Port a cath placement  09/2009; 11/19/10  . Port-a-cath removal  04/2010    "due to staph & strept"  . Port-a-cath removal  12/23/2010    Procedure: REMOVAL PORT-A-CATH;  Surgeon: Pedro Earls, MD;  Location: [facility];  Service: General;  Laterality: Left;  . Breast surgery  09/2009    right breast biopsy  . Mastectomy  11/19/10    bilateral mastectomy   . I&d extremity  ~ 2010    right hand; S/P "dog bite"  . Peripherally inserted central catheter insertion    . Spider bite     Family History  Problem Relation Age of Onset  . Lung cancer Mother   . Lung cancer Father   . Arthritis Other   . Diabetes Other   . Hyperlipidemia Other    History  Substance Use Topics  . Smoking status: Never Smoker   . Smokeless tobacco: Never Used  Comment: Never used tobacco  . Alcohol Use: No   OB History    No data available     Review of Systems  Constitutional: Positive for fever and chills.  Skin: Positive for color change.  All other systems reviewed and are negative.     Allergies  Ammonia; Contrast media; Iohexol; Methotrexate; Midazolam hcl; Nalbuphine; Infliximab; Ketorolac tromethamine; Nsaids; and Celecoxib  Home Medications   Prior to Admission medications   Medication Sig Start Date End Date Taking? Authorizing Provider  acetaminophen (TYLENOL) 500 MG tablet Take 1,000 mg by mouth every 6 (six) hours as needed (pain).    Historical Provider, MD  alum & mag hydroxide-simeth (MAALOX/MYLANTA) 200-200-20 MG/5ML suspension Take 30 mLs by mouth every 6 (six) hours as needed for indigestion or heartburn (dyspepsia). 08/27/13   Debbe Odea, MD  bisacodyl (BISACODYL) 5 MG EC tablet Take 5 mg by  mouth daily as needed for moderate constipation.    Historical Provider, MD  carisoprodol (SOMA) 350 MG tablet Take one tablet by mouth three times daily; Take one tablet by mouth at bedtime 08/29/13   Lauree Chandler, NP  clonazePAM Bobbye [place]) 1 MG tablet Take one tablet by mouth twice daily 08/28/13   Estill Dooms, MD  docusate sodium (COLACE) 100 MG capsule Take 200 mg by mouth 2 (two) times daily.     Historical Provider, MD  DULoxetine (CYMBALTA) 60 MG capsule Take 1 capsule (60 mg total) by mouth 2 (two) times daily. 06/11/12   Rowe Clack, MD  EPINEPHrine (EPIPEN) 0.3 mg/0.3 mL IJ SOAJ injection Inject 0.3 mg into the muscle once.    Historical Provider, MD  fluconazole (DIFLUCAN) 100 MG tablet Take 1 tablet (100 mg total) by mouth daily. 09/12/13   Volanda Napoleon, MD  furosemide (LASIX) 20 MG tablet Take 40 mg by mouth daily.     Historical Provider, MD  gabapentin (NEURONTIN) 300 MG capsule Take 3 capsules (900 mg total) by mouth 3 (three) times daily. 04/25/12   Rowe Clack, MD  hydrochlorothiazide (HYDRODIURIL) 25 MG tablet Take 25 mg by mouth daily as needed (for lower leg edema).     Historical Provider, MD  HYDROcodone-acetaminophen (NORCO/VICODIN) 5-325 MG per tablet 3 tablets every 4 (four) hours as needed. Take two tablets by mouth every four hours as needed for pain 08/28/13   Estill Dooms, MD  pantoprazole (PROTONIX) 40 MG tablet Take 40 mg by mouth daily.    Historical Provider, MD  potassium chloride SA (K-DUR,KLOR-CON) 20 MEQ tablet Take 20 mEq by mouth 2 (two) times daily.     Historical Provider, MD  predniSONE (DELTASONE) 5 MG tablet Take 7.5 mg by mouth daily with breakfast.    Historical Provider, MD  promethazine (PHENERGAN) 25 MG suppository Place 25 mg rectally every 6 (six) hours as needed for nausea or vomiting.    Historical Provider, MD  rOPINIRole (REQUIP) 1 MG tablet Take 1 mg by mouth at bedtime.    Historical Provider, MD  senna-docusate (SENOKOT-S)  8.6-50 MG per tablet Take 1 tablet by mouth 2 (two) times daily as needed.     Historical Provider, MD  traZODone (DESYREL) 50 MG tablet Take 50-150 mg by mouth at bedtime as needed for sleep.    Historical Provider, MD   BP 130/59 mmHg  Pulse 79  Temp(Src) 98.8 F (37.1 C) (Oral)  Resp 16  Wt 185 lb (83.915 kg)  SpO2 96% Physical Exam  Constitutional: She is oriented  to person, place, and time. She appears well-developed and well-nourished. No distress.  HENT:  Head: Normocephalic and atraumatic.  Eyes: Conjunctivae are normal. No scleral icterus.  Neck: Neck supple.  Cardiovascular: Normal rate and intact distal pulses.   Pulmonary/Chest: Effort normal. No stridor. No respiratory distress.  Abdominal: Normal appearance. She exhibits no distension.  Neurological: She is alert and oriented to person, place, and time.  Skin: Skin is warm and dry. No rash noted.     Psychiatric: She has a normal mood and affect. Her behavior is normal.  Nursing note and vitals reviewed.   ED Course  Procedures (including critical care time)  EMERGENCY DEPARTMENT US SOFT TISSUE INTERPRETATION "Study: Limited Soft Tissue Ultrasound"  INDICATIONS: Pain and Soft tissue infection Multiple views of the body part were obtained in real-time with a multi-frequency linear probe PERFORMED BY:  Myself IMAGES ARCHIVED?: Yes SIDE:Left BODY PART:Lower extremity FINDINGS: Abcess present and Cellulitis present INTERPRETATION:  Abcess present and Cellulitis present     DIAGNOSTIC STUDIES: Oxygen Saturation is 96% on RA, adequate by my interpretation.    COORDINATION OF CARE:    Labs Review Labs Reviewed  CBC WITH DIFFERENTIAL - Abnormal; Notable for the following:    RBC 3.48 (*)    Hemoglobin 10.4 (*)    HCT 32.4 (*)    All other components within normal limits  BASIC METABOLIC PANEL - Abnormal; Notable for the following:    Potassium 3.5 (*)    Glucose, Bld 133 (*)    Creatinine, Ser 1.30  (*)    GFR calc non Af Amer 44 (*)    GFR calc Af Amer 51 (*)    All other components within normal limits    Imaging Review No results found.   EKG Interpretation None      MDM   Final diagnoses:  Cellulitis and abscess of leg    58 year old female with history of recurrent cellulitis and abscess of her left upper leg.  A few months ago, she required incision and drainage and IV antibiotics. Her primary doctor today, who recommended she present to the emergency room to be admitted to the hospital. Patient has a large abscess, which appears complicated and deep. For this reason, will consult general surgery for evaluation. Will also consult internal medicine for admission for IV antibiotics. She has required PICC lines and infectious disease consultation in the past.   I personally performed the services described in this documentation, which was scribed in my presence. The recorded information has been reviewed and is accurate.     Artis Delay, MD 12/21/13 [phone]

## 2013-12-20 NOTE — ED Notes (Signed)
Sent here from pcp w c.o left thigh swollen, red painful w knot x 2-7 days

## 2013-12-20 NOTE — ED Notes (Signed)
Pt reports was sent by PCP due to cellulitis to left upper thigh.  Reports hx of this and had it lanced and was sent to [facility] for IV antibiotics.

## 2013-12-21 ENCOUNTER — Encounter (HOSPITAL_COMMUNITY): Payer: Self-pay

## 2013-12-21 ENCOUNTER — Inpatient Hospital Stay (HOSPITAL_COMMUNITY): Payer: Medicare Other

## 2013-12-21 DIAGNOSIS — C50919 Malignant neoplasm of unspecified site of unspecified female breast: Secondary | ICD-10-CM

## 2013-12-21 DIAGNOSIS — M3500 Sicca syndrome, unspecified: Secondary | ICD-10-CM | POA: Diagnosis not present

## 2013-12-21 DIAGNOSIS — L0291 Cutaneous abscess, unspecified: Secondary | ICD-10-CM | POA: Diagnosis present

## 2013-12-21 DIAGNOSIS — E271 Primary adrenocortical insufficiency: Secondary | ICD-10-CM | POA: Diagnosis present

## 2013-12-21 DIAGNOSIS — M069 Rheumatoid arthritis, unspecified: Secondary | ICD-10-CM | POA: Diagnosis present

## 2013-12-21 DIAGNOSIS — M7989 Other specified soft tissue disorders: Secondary | ICD-10-CM | POA: Diagnosis present

## 2013-12-21 DIAGNOSIS — Z79899 Other long term (current) drug therapy: Secondary | ICD-10-CM | POA: Diagnosis not present

## 2013-12-21 DIAGNOSIS — E785 Hyperlipidemia, unspecified: Secondary | ICD-10-CM | POA: Diagnosis present

## 2013-12-21 DIAGNOSIS — D849 Immunodeficiency, unspecified: Secondary | ICD-10-CM

## 2013-12-21 DIAGNOSIS — M329 Systemic lupus erythematosus, unspecified: Secondary | ICD-10-CM

## 2013-12-21 DIAGNOSIS — E876 Hypokalemia: Secondary | ICD-10-CM | POA: Diagnosis present

## 2013-12-21 DIAGNOSIS — Z7952 Long term (current) use of systemic steroids: Secondary | ICD-10-CM | POA: Diagnosis not present

## 2013-12-21 DIAGNOSIS — L03116 Cellulitis of left lower limb: Secondary | ICD-10-CM | POA: Diagnosis present

## 2013-12-21 DIAGNOSIS — I10 Essential (primary) hypertension: Secondary | ICD-10-CM | POA: Diagnosis present

## 2013-12-21 DIAGNOSIS — L0390 Cellulitis, unspecified: Secondary | ICD-10-CM

## 2013-12-21 DIAGNOSIS — F329 Major depressive disorder, single episode, unspecified: Secondary | ICD-10-CM | POA: Diagnosis present

## 2013-12-21 DIAGNOSIS — F419 Anxiety disorder, unspecified: Secondary | ICD-10-CM | POA: Diagnosis present

## 2013-12-21 DIAGNOSIS — E119 Type 2 diabetes mellitus without complications: Secondary | ICD-10-CM | POA: Diagnosis present

## 2013-12-21 DIAGNOSIS — G40909 Epilepsy, unspecified, not intractable, without status epilepticus: Secondary | ICD-10-CM | POA: Diagnosis present

## 2013-12-21 DIAGNOSIS — G894 Chronic pain syndrome: Secondary | ICD-10-CM | POA: Diagnosis present

## 2013-12-21 DIAGNOSIS — G629 Polyneuropathy, unspecified: Secondary | ICD-10-CM | POA: Diagnosis present

## 2013-12-21 DIAGNOSIS — K219 Gastro-esophageal reflux disease without esophagitis: Secondary | ICD-10-CM | POA: Diagnosis present

## 2013-12-21 DIAGNOSIS — E039 Hypothyroidism, unspecified: Secondary | ICD-10-CM | POA: Diagnosis present

## 2013-12-21 DIAGNOSIS — Z853 Personal history of malignant neoplasm of breast: Secondary | ICD-10-CM | POA: Diagnosis not present

## 2013-12-21 DIAGNOSIS — E118 Type 2 diabetes mellitus with unspecified complications: Secondary | ICD-10-CM

## 2013-12-21 LAB — COMPREHENSIVE METABOLIC PANEL
ALK PHOS: 88 U/L (ref 39–117)
ALT: 12 U/L (ref 0–35)
ANION GAP: 15 (ref 5–15)
AST: 15 U/L (ref 0–37)
Albumin: 3.2 g/dL — ABNORMAL LOW (ref 3.5–5.2)
BILIRUBIN TOTAL: 0.3 mg/dL (ref 0.3–1.2)
BUN: 16 mg/dL (ref 6–23)
CHLORIDE: 96 meq/L (ref 96–112)
CO2: 26 mEq/L (ref 19–32)
Calcium: 8.7 mg/dL (ref 8.4–10.5)
Creatinine, Ser: 1.05 mg/dL (ref 0.50–1.10)
GFR calc non Af Amer: 57 mL/min — ABNORMAL LOW (ref >90)
GFR, EST AFRICAN AMERICAN: 66 mL/min — AB (ref >90)
GLUCOSE: 147 mg/dL — AB (ref 70–99)
POTASSIUM: 3.5 meq/L — AB (ref 3.7–5.3)
Sodium: 137 mEq/L (ref 137–147)
Total Protein: 6.4 g/dL (ref 6.0–8.3)

## 2013-12-21 LAB — PROTIME-INR
INR: 1.13 (ref 0.00–1.49)
Prothrombin Time: 14.6 seconds (ref 11.6–15.2)

## 2013-12-21 LAB — CBC
HEMATOCRIT: 35.4 % — AB (ref 36.0–46.0)
Hemoglobin: 11.3 g/dL — ABNORMAL LOW (ref 12.0–15.0)
MCH: 29.7 pg (ref 26.0–34.0)
MCHC: 31.9 g/dL (ref 30.0–36.0)
MCV: 92.9 fL (ref 78.0–100.0)
Platelets: 204 10*3/uL (ref 150–400)
RBC: 3.81 MIL/uL — ABNORMAL LOW (ref 3.87–5.11)
RDW: 14.2 % (ref 11.5–15.5)
WBC: 7.9 10*3/uL (ref 4.0–10.5)

## 2013-12-21 LAB — SEDIMENTATION RATE: Sed Rate: 25 mm/hr — ABNORMAL HIGH (ref 0–22)

## 2013-12-21 LAB — SURGICAL PCR SCREEN
MRSA, PCR: POSITIVE — AB
Staphylococcus aureus: POSITIVE — AB

## 2013-12-21 LAB — C-REACTIVE PROTEIN: CRP: 7.7 mg/dL — ABNORMAL HIGH (ref <0.60)

## 2013-12-21 MED ORDER — MIDAZOLAM HCL 2 MG/2ML IJ SOLN
INTRAMUSCULAR | Status: AC
  Filled 2013-12-21: qty 2

## 2013-12-21 MED ORDER — DULOXETINE HCL 60 MG PO CPEP
60.0000 mg | ORAL_CAPSULE | Freq: Two times a day (BID) | ORAL | Status: DC
  Administered 2013-12-21 – 2013-12-29 (×17): 60 mg via ORAL
  Filled 2013-12-21 (×21): qty 1

## 2013-12-21 MED ORDER — FENTANYL CITRATE 0.05 MG/ML IJ SOLN
INTRAMUSCULAR | Status: AC
  Filled 2013-12-21: qty 5

## 2013-12-21 MED ORDER — ACETAMINOPHEN 500 MG PO TABS: 1000.0000 mg | ORAL_TABLET | Freq: Four times a day (QID) | ORAL | Status: DC | PRN

## 2013-12-21 MED ORDER — ROPINIROLE HCL 1 MG PO TABS
1.0000 mg | ORAL_TABLET | Freq: Every day | ORAL | Status: DC
  Administered 2013-12-21 – 2013-12-28 (×8): 1 mg via ORAL
  Filled 2013-12-21 (×9): qty 1

## 2013-12-21 MED ORDER — ALUM & MAG HYDROXIDE-SIMETH 200-200-20 MG/5ML PO SUSP: 30.0000 mL | Freq: Four times a day (QID) | ORAL | Status: DC | PRN

## 2013-12-21 MED ORDER — PANTOPRAZOLE SODIUM 40 MG PO TBEC
40.0000 mg | DELAYED_RELEASE_TABLET | Freq: Every day | ORAL | Status: DC
  Administered 2013-12-21 – 2013-12-29 (×9): 40 mg via ORAL
  Filled 2013-12-21 (×9): qty 1

## 2013-12-21 MED ORDER — MORPHINE SULFATE 2 MG/ML IJ SOLN
2.0000 mg | INTRAMUSCULAR | Status: DC | PRN
Start: 2013-12-21 — End: 2013-12-21
  Administered 2013-12-21: 2 mg via INTRAVENOUS
  Filled 2013-12-21: qty 1

## 2013-12-21 MED ORDER — CLONAZEPAM 1 MG PO TABS
1.0000 mg | ORAL_TABLET | Freq: Two times a day (BID) | ORAL | Status: DC | PRN
  Administered 2013-12-21 – 2013-12-29 (×12): 1 mg via ORAL
  Filled 2013-12-21 (×12): qty 1

## 2013-12-21 MED ORDER — PROMETHAZINE HCL 25 MG RE SUPP
25.0000 mg | Freq: Four times a day (QID) | RECTAL | Status: DC | PRN
  Administered 2013-12-27 – 2013-12-28 (×3): 25 mg via RECTAL
  Filled 2013-12-21 (×3): qty 1

## 2013-12-21 MED ORDER — SODIUM CHLORIDE 0.9 % IJ SOLN
10.0000 mL | INTRAMUSCULAR | Status: DC | PRN
  Administered 2013-12-22 – 2013-12-29 (×7): 10 mL
  Filled 2013-12-21 (×7): qty 40

## 2013-12-21 MED ORDER — HEPARIN SODIUM (PORCINE) 5000 UNIT/ML IJ SOLN
5000.0000 [IU] | Freq: Three times a day (TID) | INTRAMUSCULAR | Status: DC
  Administered 2013-12-21 – 2013-12-29 (×24): 5000 [IU] via SUBCUTANEOUS
  Filled 2013-12-21 (×32): qty 1

## 2013-12-21 MED ORDER — ROCURONIUM BROMIDE 50 MG/5ML IV SOLN
INTRAVENOUS | Status: AC
  Filled 2013-12-21: qty 1

## 2013-12-21 MED ORDER — HYDROCORTISONE NA SUCCINATE PF 100 MG IJ SOLR: 100.0000 mg | Freq: Once | INTRAMUSCULAR | Status: AC | PRN

## 2013-12-21 MED ORDER — HYDROMORPHONE HCL 1 MG/ML IJ SOLN
0.5000 mg | INTRAMUSCULAR | Status: DC | PRN
  Administered 2013-12-21 (×2): 0.5 mg via INTRAVENOUS
  Filled 2013-12-21: qty 1

## 2013-12-21 MED ORDER — TRAZODONE HCL 50 MG PO TABS
50.0000 mg | ORAL_TABLET | Freq: Every evening | ORAL | Status: DC | PRN
  Administered 2013-12-25: 150 mg via ORAL
  Filled 2013-12-21 (×2): qty 3

## 2013-12-21 MED ORDER — BISACODYL 5 MG PO TBEC: 5.0000 mg | DELAYED_RELEASE_TABLET | Freq: Every day | ORAL | Status: DC | PRN

## 2013-12-21 MED ORDER — CARISOPRODOL 350 MG PO TABS
350.0000 mg | ORAL_TABLET | Freq: Three times a day (TID) | ORAL | Status: DC
  Administered 2013-12-21 – 2013-12-29 (×22): 350 mg via ORAL
  Filled 2013-12-21 (×22): qty 1

## 2013-12-21 MED ORDER — HYDROMORPHONE HCL 1 MG/ML IJ SOLN
0.5000 mg | Freq: Once | INTRAMUSCULAR | Status: AC
  Administered 2013-12-21: 0.5 mg via INTRAVENOUS
  Filled 2013-12-21: qty 1

## 2013-12-21 MED ORDER — ONDANSETRON HCL 4 MG PO TABS: 4.0000 mg | ORAL_TABLET | Freq: Four times a day (QID) | ORAL | Status: DC | PRN

## 2013-12-21 MED ORDER — PROPOFOL 10 MG/ML IV BOLUS
INTRAVENOUS | Status: AC
  Filled 2013-12-21: qty 20

## 2013-12-21 MED ORDER — ONDANSETRON HCL 4 MG/2ML IJ SOLN
4.0000 mg | Freq: Four times a day (QID) | INTRAMUSCULAR | Status: DC | PRN
  Filled 2013-12-21: qty 2

## 2013-12-21 MED ORDER — HYDROMORPHONE HCL 1 MG/ML IJ SOLN
0.5000 mg | INTRAMUSCULAR | Status: DC | PRN
  Administered 2013-12-21 – 2013-12-22 (×3): 1 mg via INTRAVENOUS
  Filled 2013-12-21 (×4): qty 1

## 2013-12-21 MED ORDER — SENNOSIDES-DOCUSATE SODIUM 8.6-50 MG PO TABS: 1.0000 | ORAL_TABLET | Freq: Two times a day (BID) | ORAL | Status: DC | PRN

## 2013-12-21 MED ORDER — FUROSEMIDE 40 MG PO TABS
40.0000 mg | ORAL_TABLET | Freq: Every day | ORAL | Status: DC
  Administered 2013-12-21 – 2013-12-22 (×2): 40 mg via ORAL
  Filled 2013-12-21 (×2): qty 1

## 2013-12-21 MED ORDER — POTASSIUM CHLORIDE CRYS ER 20 MEQ PO TBCR
20.0000 meq | EXTENDED_RELEASE_TABLET | Freq: Two times a day (BID) | ORAL | Status: DC
  Administered 2013-12-21 – 2013-12-26 (×10): 20 meq via ORAL
  Filled 2013-12-21 (×16): qty 1

## 2013-12-21 MED ORDER — DIPHENHYDRAMINE HCL 50 MG/ML IJ SOLN
25.0000 mg | Freq: Once | INTRAMUSCULAR | Status: AC
  Administered 2013-12-21: 25 mg via INTRAVENOUS
  Filled 2013-12-21: qty 1

## 2013-12-21 MED ORDER — FAMOTIDINE IN NACL 20-0.9 MG/50ML-% IV SOLN
20.0000 mg | Freq: Two times a day (BID) | INTRAVENOUS | Status: DC
  Administered 2013-12-21 (×2): 20 mg via INTRAVENOUS
  Filled 2013-12-21 (×4): qty 50

## 2013-12-21 MED ORDER — PIPERACILLIN-TAZOBACTAM 3.375 G IVPB
3.3750 g | Freq: Three times a day (TID) | INTRAVENOUS | Status: DC
  Administered 2013-12-21 – 2013-12-26 (×16): 3.375 g via INTRAVENOUS
  Filled 2013-12-21 (×18): qty 50

## 2013-12-21 MED ORDER — GABAPENTIN 400 MG PO CAPS
800.0000 mg | ORAL_CAPSULE | Freq: Three times a day (TID) | ORAL | Status: DC
  Administered 2013-12-21 – 2013-12-29 (×25): 800 mg via ORAL
  Filled 2013-12-21 (×31): qty 2

## 2013-12-21 MED ORDER — HYDROCHLOROTHIAZIDE 25 MG PO TABS
25.0000 mg | ORAL_TABLET | Freq: Every day | ORAL | Status: DC
  Administered 2013-12-21 – 2013-12-26 (×6): 25 mg via ORAL
  Filled 2013-12-21 (×8): qty 1

## 2013-12-21 MED ORDER — DOCUSATE SODIUM 100 MG PO CAPS
200.0000 mg | ORAL_CAPSULE | Freq: Two times a day (BID) | ORAL | Status: DC
  Administered 2013-12-21 – 2013-12-29 (×17): 200 mg via ORAL
  Filled 2013-12-21 (×18): qty 2

## 2013-12-21 MED ORDER — PREDNISONE 5 MG PO TABS
7.5000 mg | ORAL_TABLET | Freq: Every day | ORAL | Status: DC
  Administered 2013-12-21 – 2013-12-29 (×8): 7.5 mg via ORAL
  Filled 2013-12-21 (×10): qty 1

## 2013-12-21 MED ORDER — VANCOMYCIN HCL IN DEXTROSE 750-5 MG/150ML-% IV SOLN
750.0000 mg | Freq: Two times a day (BID) | INTRAVENOUS | Status: DC
  Administered 2013-12-21 – 2013-12-22 (×2): 750 mg via INTRAVENOUS
  Filled 2013-12-21 (×3): qty 150

## 2013-12-21 MED ORDER — HYDROCODONE-ACETAMINOPHEN 10-325 MG PO TABS
1.0000 | ORAL_TABLET | Freq: Four times a day (QID) | ORAL | Status: DC | PRN
  Administered 2013-12-21 – 2013-12-24 (×6): 1 via ORAL
  Filled 2013-12-21 (×6): qty 1

## 2013-12-21 NOTE — Consult Note (Signed)
Reason for Consult:Right thigh abscess Referring Physician: Elgergawy  [name] Carr is an 58 y.o. female.  HPI: [name] Carr came in to the ED for evaluation of left thigh pain and redness on 11/13. This had started on about a week prior but got much worse on 11/12. She was placed on IV abx consisting of Zosyn and Vancomycin. The redness has increased since admission. An US performed today was consistent with a possible abscess and surgery was asked to assess.  Past Medical History  Diagnosis Date  . PERSONALITY DISORDER   . Chronic pain syndrome     chronic narcotics, dependancy hx - [facility] for pain mgmt  . MIGRAINE HEADACHE   . Lyme disease   . ALLERGIC RHINITIS   . ANEMIA-IRON DEFICIENCY   . HYPERLIPIDEMIA   . GERD   . DIABETES MELLITUS, TYPE II   . DEPRESSION   . SLE (systemic lupus erythematosus)     [facility] - chronic pred  . Psoriasis   . Addison's disease   . Degenerative joint disease   . DDD (degenerative disc disease)   . Phlebitis after infusion     left ?calf; "S/P phenergan/demerol injection"  . Blood clot in vein 2011; 02/04/11    "right jugular vein;left jugular vein"  . SEIZURE DISORDER     "lots of seizures; due to lupus"  . Anxiety   . Neuropathy     hands, feet, due to diabetes & back problem  . Surgical wound infection 02/15/2011    R shoulder and mastectomy site - related to PICC  . CARCINOMA, BREAST 08/2009 dx    R breast,  mets to liver - resolution s/p chemo, s/p B mastect  . Metastases to the liver   . RA (rheumatoid arthritis)   . Hypothyroidism pt states she has never had hy pothyroidism  . Hypertension     Past Surgical History  Procedure Laterality Date  . Liver bopsy  09/2009  . Port a cath placement  09/2009; 11/19/10  . Port-a-cath removal  04/2010    "due to staph & strept"  . Port-a-cath removal  12/23/2010    Procedure: REMOVAL PORT-A-CATH;  Surgeon: Matthew B Martin, MD;  Location: [facility];  Service: General;  Laterality: Left;   . Breast surgery  09/2009    right breast biopsy  . Mastectomy  11/19/10    bilateral mastectomy   . I&d extremity  ~ 2010    right hand; S/P "dog bite"  . Peripherally inserted central catheter insertion    . Spider bite      Family History  Problem Relation Age of Onset  . Lung cancer Mother   . Lung cancer Father   . Arthritis Other   . Diabetes Other   . Hyperlipidemia Other     Social History:  reports that she has never smoked. She has never used smokeless tobacco. She reports that she does not drink alcohol or use illicit drugs.  Allergies:  Allergies  Allergen Reactions  . Ammonia Other (See Comments)    Ended up on ventilator from ammonia tabs  . Contrast Media [Iodinated Diagnostic Agents] Other (See Comments)    Doesn't breath well.  . Iohexol Other (See Comments)    SOB   . Methotrexate Anaphylaxis  . Midazolam Hcl Anaphylaxis  . Nalbuphine Other (See Comments)    Can't breathe well  . Infliximab Nausea And Vomiting  . Ketorolac Tromethamine Other (See Comments)    Injectable doesn't work   and pill hurts stomach.  . Morphine And Related Hives  . Nsaids Nausea And Vomiting  . Celecoxib Rash    Medications: I have reviewed the patient's current medications.  Results for orders placed or performed during the hospital encounter of 12/20/13 (from the past 48 hour(s))  CBC with Differential     Status: Abnormal   Collection Time: 12/20/13 11:00 PM  Result Value Ref Range   WBC 9.3 4.0 - 10.5 K/uL   RBC 3.48 (L) 3.87 - 5.11 MIL/uL   Hemoglobin 10.4 (L) 12.0 - 15.0 g/dL   HCT 32.4 (L) 36.0 - 46.0 %   MCV 93.1 78.0 - 100.0 fL   MCH 29.9 26.0 - 34.0 pg   MCHC 32.1 30.0 - 36.0 g/dL   RDW 13.8 11.5 - 15.5 %   Platelets 192 150 - 400 K/uL   Neutrophils Relative % 76 43 - 77 %   Neutro Abs 7.1 1.7 - 7.7 K/uL   Lymphocytes Relative 14 12 - 46 %   Lymphs Abs 1.3 0.7 - 4.0 K/uL   Monocytes Relative 8 3 - 12 %   Monocytes Absolute 0.7 0.1 - 1.0 K/uL    Eosinophils Relative 2 0 - 5 %   Eosinophils Absolute 0.2 0.0 - 0.7 K/uL   Basophils Relative 0 0 - 1 %   Basophils Absolute 0.0 0.0 - 0.1 K/uL  Basic metabolic panel     Status: Abnormal   Collection Time: 12/20/13 11:00 PM  Result Value Ref Range   Sodium 140 137 - 147 mEq/L   Potassium 3.5 (L) 3.7 - 5.3 mEq/L   Chloride 99 96 - 112 mEq/L   CO2 29 19 - 32 mEq/L   Glucose, Bld 133 (H) 70 - 99 mg/dL   BUN 18 6 - 23 mg/dL   Creatinine, Ser 1.30 (H) 0.50 - 1.10 mg/dL   Calcium 8.8 8.4 - 10.5 mg/dL   GFR calc non Af Amer 44 (L) >90 mL/min   GFR calc Af Amer 51 (L) >90 mL/min    Comment: (NOTE) The eGFR has been calculated using the CKD EPI equation. This calculation has not been validated in all clinical situations. eGFR's persistently <90 mL/min signify possible Chronic Kidney Disease.    Anion gap 12 5 - 15  Comprehensive metabolic panel     Status: Abnormal   Collection Time: 12/21/13  5:28 AM  Result Value Ref Range   Sodium 137 137 - 147 mEq/L   Potassium 3.5 (L) 3.7 - 5.3 mEq/L   Chloride 96 96 - 112 mEq/L   CO2 26 19 - 32 mEq/L   Glucose, Bld 147 (H) 70 - 99 mg/dL   BUN 16 6 - 23 mg/dL   Creatinine, Ser 1.05 0.50 - 1.10 mg/dL   Calcium 8.7 8.4 - 10.5 mg/dL   Total Protein 6.4 6.0 - 8.3 g/dL   Albumin 3.2 (L) 3.5 - 5.2 g/dL   AST 15 0 - 37 U/L   ALT 12 0 - 35 U/L   Alkaline Phosphatase 88 39 - 117 U/L   Total Bilirubin 0.3 0.3 - 1.2 mg/dL   GFR calc non Af Amer 57 (L) >90 mL/min   GFR calc Af Amer 66 (L) >90 mL/min    Comment: (NOTE) The eGFR has been calculated using the CKD EPI equation. This calculation has not been validated in all clinical situations. eGFR's persistently <90 mL/min signify possible Chronic Kidney Disease.    Anion gap 15 5 - 15  CBC     Status: Abnormal   Collection Time: 12/21/13  5:28 AM  Result Value Ref Range   WBC 7.9 4.0 - 10.5 K/uL   RBC 3.81 (L) 3.87 - 5.11 MIL/uL   Hemoglobin 11.3 (L) 12.0 - 15.0 g/dL   HCT 35.4 (L) 36.0 - 46.0  %   MCV 92.9 78.0 - 100.0 fL   MCH 29.7 26.0 - 34.0 pg   MCHC 31.9 30.0 - 36.0 g/dL   RDW 14.2 11.5 - 15.5 %   Platelets 204 150 - 400 K/uL  Protime-INR     Status: None   Collection Time: 12/21/13  5:28 AM  Result Value Ref Range   Prothrombin Time 14.6 11.6 - 15.2 seconds   INR 1.13 0.00 - 1.49  Sedimentation rate     Status: Abnormal   Collection Time: 12/21/13  5:28 AM  Result Value Ref Range   Sed Rate 25 (H) 0 - 22 mm/hr    [place] Extrem Low Left Ltd  12/21/2013   CLINICAL DATA:  58 year old female with upper left thigh swelling and redness. History of previous abscess in this area 6 months ago.  EXAM: ULTRASOUND LEFT LOWER EXTREMITY LIMITED  TECHNIQUE: Ultrasound examination of the lower extremity soft tissues was performed in the area of clinical concern.  COMPARISON:  08/23/2013 CT 07/05/2013 ultrasound.  FINDINGS: Two separate hypoechoic areas within the upper left thigh in the area of redness are identified without internal vascular flow and suspicious for abscess/ infected collection or hematoma.  The more superficial hypoechoic area measures 4.1 x 2.5 x 4.1 cm and the deeper hypoechoic area measures 3.6 x 1.7 x 3.1 cm.  Overlying subcutaneous edema is noted.  IMPRESSION: Two separate hypoechoic areas within the upper left thigh underlying the area of redness suspicious for abscess/infected collection or possibly hematoma. The more superficial area measures 4.1 x 2.5 x 4.1 cm and the deeper area measures 3.6 x 1.7 x 3.1 cm.   Electronically Signed   By: Hassan Rowan M.D.   On: 12/21/2013 13:33    Review of Systems  Musculoskeletal: Positive for myalgias (Left thigh).  All other systems reviewed and are negative.  Blood pressure 101/64, pulse 72, temperature 97.9 F (36.6 C), temperature source Oral, resp. rate 18, height 5' 4" (1.626 m), weight 184 lb 15.5 oz (83.9 kg), SpO2 95 %. Physical Exam  Constitutional: She appears well-developed and well-nourished. No distress.  HENT:   Head: Normocephalic and atraumatic.  Eyes: Conjunctivae are normal. Right eye exhibits no discharge. Left eye exhibits no discharge. No scleral icterus.  Neck: Normal range of motion.  Cardiovascular: Normal rate, regular rhythm, normal heart sounds and intact distal pulses.  Exam reveals no gallop and no friction rub.   No murmur heard. Respiratory: Effort normal and breath sounds normal. No respiratory distress. She has no wheezes. She has no rales.  GI: Soft. Bowel sounds are normal. She exhibits no distension. There is no tenderness. There is no rebound and no guarding.  Musculoskeletal:  Left thigh shows mild erythema on outer aspect with significant moderately tender induration. A nodular aspect centrally has a central punctate lesion and is fluctuant.   Lymphadenopathy:       Left: Inguinal adenopathy present.  Neurological: She is alert.  Skin: Skin is warm and dry. She is not diaphoretic.  Psychiatric: She has a normal mood and affect. Her behavior is normal.    Assessment/Plan: Left thigh abscess -- Given the 2 separate areas and  the fact that 1 is deep I advocate I&D in the OR. Unfortunately she has just eaten. Make NPO, surgeon to assess shortly.  Thank you for this consult.    Lisette Abu, PA-C Pager: [phone] 12/21/2013, 3:06 PM

## 2013-12-21 NOTE — H&P (Signed)
[facility] History and Physical  Patient: [name] Carr  ZDG:[id number]  DOB: May 21, 1955  DOS: the patient was seen and examined on 12/21/2013 PCP: [name], MD  Chief Complaint: Left thigh redness and swelling  HPI: [name] Carr is a 58 y.o. female with Past medical history of SLE, chronic pain syndrome, hypertension, history of diabetes, Sjogren, Addison's disease, chronic leg swelling, possible diastolic dysfunction. The patient is presenting with complaints of left eye redness. She mentions only was he started noticing some swelling at the left thigh which was progressively worsening with developing a redness of the surrounding area. Since last one day she has also noticed a bump with blackish discoloration. She denies any injury or trauma to the hot high but she complains that she had a fall which was mechanical 2 weeks ago and has been having sciatica like pain on the right side since that on and off. She has been ambulating appropriately until now. She denies any fever and chills denies any chest pain or shortness of breath or cough. Denies any diarrhea or constipation.   The patient is coming from home And at her baseline independent for most of her ADL.  Review of Systems: as mentioned in the history of present illness.  A Comprehensive review of the other systems is negative.  Past Medical History  Diagnosis Date  . PERSONALITY DISORDER   . Chronic pain syndrome     chronic narcotics, dependancy hx - [place] for pain mgmt  . MIGRAINE HEADACHE   . Lyme disease   . ALLERGIC RHINITIS   . ANEMIA-IRON DEFICIENCY   . HYPERLIPIDEMIA   . GERD   . DIABETES MELLITUS, TYPE II   . DEPRESSION   . SLE (systemic lupus erythematosus)     [place] [place] [facility] [place] - chronic pred  . Psoriasis   . Addison's disease   . Degenerative joint disease   . DDD (degenerative disc disease)   . Phlebitis after infusion     left ?calf; "S/P phenergan/demerol injection"  . Blood  clot in vein 2011; 02/04/11    "right jugular vein;left jugular vein"  . SEIZURE DISORDER     "lots of seizures; due to lupus"  . Anxiety   . Neuropathy     hands, feet, due to diabetes & back problem  . Surgical wound infection 02/15/2011    R shoulder and mastectomy site - related to PICC  . CARCINOMA, BREAST 08/2009 dx    R breast,  mets to liver - resolution s/p chemo, s/p B mastect  . Metastases to the liver   . RA (rheumatoid arthritis)   . Hypothyroidism pt states she has never had hy pothyroidism  . Hypertension    Past Surgical History  Procedure Laterality Date  . Liver bopsy  09/2009  . Port a cath placement  09/2009; 11/19/10  . Port-a-cath removal  04/2010    "due to staph & strept"  . Port-a-cath removal  12/23/2010    Procedure: REMOVAL PORT-A-CATH;  Surgeon: Pedro Earls, MD;  Location: [facility];  Service: General;  Laterality: Left;  . Breast surgery  09/2009    right breast biopsy  . Mastectomy  11/19/10    bilateral mastectomy   . I&d extremity  ~ 2010    right hand; S/P "dog bite"  . Peripherally inserted central catheter insertion    . Spider bite     Social History:  reports that she has never smoked. She has never used smokeless tobacco.  She reports that she does not drink alcohol or use illicit drugs.  Allergies  Allergen Reactions  . Ammonia Other (See Comments)    Ended up on ventilator from ammonia tabs  . Contrast Media [Iodinated Diagnostic Agents] Other (See Comments)    Doesn't breath well.  . Iohexol Other (See Comments)    SOB   . Methotrexate Anaphylaxis  . Midazolam Hcl Anaphylaxis  . Nalbuphine Other (See Comments)    Can't breathe well  . Infliximab Nausea And Vomiting  . Ketorolac Tromethamine Other (See Comments)    Injectable doesn't work and pill hurts stomach.  . Nsaids Nausea And Vomiting  . Celecoxib Rash    Family History  Problem Relation Age of Onset  . Lung cancer Mother   . Lung cancer Father   . Arthritis Other    . Diabetes Other   . Hyperlipidemia Other     Prior to Admission medications   Medication Sig Start Date End Date Taking? Authorizing Provider  HYDROcodone-acetaminophen (NORCO) 10-325 MG per tablet Take 1 tablet by mouth every 6 (six) hours as needed.   Yes Historical Provider, MD  acetaminophen (TYLENOL) 500 MG tablet Take 1,000 mg by mouth every 6 (six) hours as needed (pain).    Historical Provider, MD  alum & mag hydroxide-simeth (MAALOX/MYLANTA) 200-200-20 MG/5ML suspension Take 30 mLs by mouth every 6 (six) hours as needed for indigestion or heartburn (dyspepsia). 08/27/13   Debbe Odea, MD  bisacodyl (BISACODYL) 5 MG EC tablet Take 5 mg by mouth daily as needed for moderate constipation.    Historical Provider, MD  carisoprodol (SOMA) 350 MG tablet Take one tablet by mouth three times daily; Take one tablet by mouth at bedtime 08/29/13   Lauree Chandler, NP  clonazePAM Bobbye [place]) 1 MG tablet Take one tablet by mouth twice daily 08/28/13   Estill Dooms, MD  docusate sodium (COLACE) 100 MG capsule Take 200 mg by mouth 2 (two) times daily.     Historical Provider, MD  DULoxetine (CYMBALTA) 60 MG capsule Take 1 capsule (60 mg total) by mouth 2 (two) times daily. 06/11/12   Rowe Clack, MD  EPINEPHrine (EPIPEN) 0.3 mg/0.3 mL IJ SOAJ injection Inject 0.3 mg into the muscle once.    Historical Provider, MD  fluconazole (DIFLUCAN) 100 MG tablet Take 1 tablet (100 mg total) by mouth daily. Patient taking differently: Take 100 mg by mouth as needed.  09/12/13   Volanda Napoleon, MD  furosemide (LASIX) 20 MG tablet Take 40 mg by mouth daily.     Historical Provider, MD  gabapentin (NEURONTIN) 300 MG capsule Take 3 capsules (900 mg total) by mouth 3 (three) times daily. Patient taking differently: Take 800 mg by mouth 3 (three) times daily.  04/25/12   Rowe Clack, MD  hydrochlorothiazide (HYDRODIURIL) 25 MG tablet Take 25 mg by mouth daily.     Historical Provider, MD  pantoprazole  (PROTONIX) 40 MG tablet Take 40 mg by mouth daily.    Historical Provider, MD  potassium chloride SA (K-DUR,KLOR-CON) 20 MEQ tablet Take 20 mEq by mouth 2 (two) times daily.     Historical Provider, MD  predniSONE (DELTASONE) 5 MG tablet Take 7.5 mg by mouth daily with breakfast.    Historical Provider, MD  promethazine (PHENERGAN) 25 MG suppository Place 25 mg rectally every 6 (six) hours as needed for nausea or vomiting.    Historical Provider, MD  rOPINIRole (REQUIP) 1 MG tablet Take 1 mg by  mouth at bedtime.    Historical Provider, MD  senna-docusate (SENOKOT-S) 8.6-50 MG per tablet Take 1 tablet by mouth 2 (two) times daily as needed.     Historical Provider, MD  traZODone (DESYREL) 50 MG tablet Take 50-150 mg by mouth at bedtime as needed for sleep.    Historical Provider, MD    Physical Exam: Filed Vitals:   12/20/13 2202 12/21/13 0054 12/21/13 0231 12/21/13 0611  BP: 130/59 91/45 141/69 101/64  Pulse: 79 72 71 72  Temp: 98.8 F (37.1 C) 98.5 F (36.9 C) 97.8 F (36.6 C) 97.9 F (36.6 C)  TempSrc: Oral Oral Oral Oral  Resp: 16 18 20 18   Height:   5\' 4"  (1.626 m)   Weight: 83.915 kg (185 lb)  83.9 kg (184 lb 15.5 oz)   SpO2: 96% 95% 97% 95%    General: Alert, Awake and Oriented to Time, Place and Person. Appear in mild distress Eyes: PERRL ENT: Oral Mucosa clear moist. Neck: no JVD Cardiovascular: S1 and S2 Present, no Murmur, Peripheral Pulses Present Respiratory: Bilateral Air entry equal and Decreased, Clear to Auscultation, noCrackles, no wheezes Abdomen: Bowel Sound present, Soft and non tender Skin: no Rash Extremities: no Pedal edema, no calf tenderness Neurologic: Grossly no focal neuro deficit.  Labs on Admission:  CBC:  Recent Labs Lab 12/20/13 2300  WBC 9.3  NEUTROABS 7.1  HGB 10.4*  HCT 32.4*  MCV 93.1  PLT 192    CMP     Component Value Date/Time   NA 140 12/20/2013 2300   NA 135 04/25/2013 1439   NA 135* 04/08/2013 1148   K 3.5* 12/20/2013  2300   K 3.5 04/25/2013 1439   K 4.6 04/08/2013 1148   CL 99 12/20/2013 2300   CL 90* 04/25/2013 1439   CL 91* 03/15/2012 1410   CO2 29 12/20/2013 2300   CO2 32 04/25/2013 1439   CO2 28 04/08/2013 1148   GLUCOSE 133* 12/20/2013 2300   GLUCOSE 201* 04/25/2013 1439   GLUCOSE 268* 04/08/2013 1148   GLUCOSE 131* 03/15/2012 1410   BUN 18 12/20/2013 2300   BUN 26* 04/25/2013 1439   BUN 29.9* 04/08/2013 1148   CREATININE 1.30* 12/20/2013 2300   CREATININE 0.94 09/11/2013 1718   CREATININE 1.2* 04/08/2013 1148   CALCIUM 8.8 12/20/2013 2300   CALCIUM 9.9 04/25/2013 1439   CALCIUM 9.6 04/08/2013 1148   PROT 7.6 09/12/2013 1603   PROT 8.5* 04/25/2013 1439   PROT 6.9 04/08/2013 1148   ALBUMIN 4.8 09/12/2013 1603   ALBUMIN 3.4* 04/08/2013 1148   AST 24 09/12/2013 1603   AST 21 04/25/2013 1439   AST 17 04/08/2013 1148   ALT 24 09/12/2013 1603   ALT 36 04/25/2013 1439   ALT 18 04/08/2013 1148   ALKPHOS 99 09/12/2013 1603   ALKPHOS 102* 04/25/2013 1439   ALKPHOS 100 04/08/2013 1148   BILITOT 0.6 09/12/2013 1603   BILITOT 0.70 04/25/2013 1439   BILITOT 0.34 04/08/2013 1148   GFRNONAA 44* 12/20/2013 2300   GFRNONAA 67 09/11/2013 1718   GFRAA 51* 12/20/2013 2300   GFRAA 77 09/11/2013 1718    No results for input(s): LIPASE, AMYLASE in the last 168 hours. No results for input(s): AMMONIA in the last 168 hours.  No results for input(s): CKTOTAL, CKMB, CKMBINDEX, TROPONINI in the last 168 hours. BNP (last 3 results) No results for input(s): PROBNP in the last 8760 hours.  Radiological Exams on Admission: No results found.  Assessment/Plan Principal  Problem:   Abscess and cellulitis Active Problems:   Malignant neoplasm of female breast   Type 2 diabetes mellitus   Chronic pain syndrome   Immunocompromised patient   SLE-on prednisone-Follow at [facility]   Neuropathy   Hypertension   1. Abscess and cellulitis The patient is presenting with complaints of left thigh redness and  swelling. She has prior history of cellulitis in the same area with Streptococcus. At present I will follow cultures and continue her on vancomycin and Zosyn. I would open ultrasound of the local site and if there is any pus collection then surgery would be consulted. Pain management as needed.  2.history of lupus. Continue prednisone.  3.diabetes mellitus. Placing her on sliding scale.  4.chronic pain management. Patient is on Norco at home she refuses to take Percocet) not effective. She also states that she has allergic reaction with morphine therefore she'll be placed on Dilaudid.  5. Neuropathy. Continue gabapentin.  6. Hypertension. Continue home medications.  Advance goals of care discussion: full code   Consults: general surgery phone consultation  DVT Prophylaxis: subcutaneous Heparin Nutrition: regular diet  Disposition: Admitted to inpatient in med-surge unit.  Author: Berle Mull, MD [facility] Pager: [phone] 12/21/2013, 6:16 AM    If 7PM-7AM, please contact night-coverage [email or web address] Password [email or web address]

## 2013-12-21 NOTE — Progress Notes (Signed)
Dear Doctor: Posey Pronto This patient has been identified as a candidate for PICC for the following reason (s): IV therapy over 48 hours, drug pH or osmolality (causing phlebitis, infiltration in 24 hours), poor veins/poor circulatory system (CHF, COPD, emphysema, diabetes, steroid use, IV drug abuse, etc.) and restarts due to phlebitis and infiltration in 24 hours If you agree, please write an order for the indicated device. For any questions contact the Vascular Access Team at [phone] if no answer, please leave a message.  Thank you for supporting the early vascular access assessment program.

## 2013-12-21 NOTE — Progress Notes (Signed)
Peripherally Inserted Central Catheter/Midline Placement  The IV Nurse has discussed with the patient and/or persons authorized to consent for the patient, the purpose of this procedure and the potential benefits and risks involved with this procedure.  The benefits include less needle sticks, lab draws from the catheter and patient may be discharged home with the catheter.  Risks include, but not limited to, infection, bleeding, blood clot (thrombus formation), and puncture of an artery; nerve damage and irregular heat beat.  Alternatives to this procedure were also discussed.  PICC/Midline Placement Documentation  PICC / Midline Single Lumen 79/48/01 PICC Right Basilic 38 cm 2 cm (Active)  Indication for Insertion or Continuance of Line Limited venous access - need for IV therapy >5 days (PICC only);Poor Vasculature-patient has had multiple peripheral attempts or PIVs lasting less than 24 hours 12/21/2013  3:37 PM  Exposed Catheter (cm) 2 cm 12/21/2013  3:37 PM  Site Assessment Clean;Dry;Intact 12/21/2013  3:37 PM  Line Status Flushed;Saline locked;Blood return noted 12/21/2013  3:37 PM  Dressing Type Transparent 12/21/2013  3:37 PM  Dressing Status Clean;Dry;Intact;Antimicrobial disc in place 12/21/2013  3:37 PM  Line Care Connections checked and tightened 12/21/2013  3:37 PM  Line Adjustment (NICU/IV Team Only) No 12/21/2013  3:37 PM  Dressing Intervention New dressing 12/21/2013  3:37 PM  Dressing Change Due 12/28/13 12/21/2013  3:37 PM       Rolena Infante 12/21/2013, 3:39 PM

## 2013-12-21 NOTE — Progress Notes (Signed)
ANTIBIOTIC CONSULT NOTE - INITIAL  Pharmacy Consult for Vancomycin  Indication: Cellulitis  Allergies  Allergen Reactions  . Ammonia Other (See Comments)    Ended up on ventilator from ammonia tabs  . Contrast Media [Iodinated Diagnostic Agents] Other (See Comments)    Doesn't breath well.  . Iohexol Other (See Comments)    SOB   . Methotrexate Anaphylaxis  . Midazolam Hcl Anaphylaxis  . Nalbuphine Other (See Comments)    Can't breathe well  . Infliximab Nausea And Vomiting  . Ketorolac Tromethamine Other (See Comments)    Injectable doesn't work and pill hurts stomach.  . Nsaids Nausea And Vomiting  . Celecoxib Rash    Patient Measurements: Height: 5\' 4"  (162.6 cm) Weight: 184 lb 15.5 oz (83.9 kg) IBW/kg (Calculated) : 54.711  Vital Signs: Temp: 97.8 F (36.6 C) (11/14 0231) Temp Source: Oral (11/14 0231) BP: 141/69 mmHg (11/14 0231) Pulse Rate: 71 (11/14 0231)  Labs:  Recent Labs  12/20/13 2300  WBC 9.3  HGB 10.4*  PLT 192  CREATININE 1.30*   Estimated Creatinine Clearance: 48.8 mL/min (by C-G formula based on Cr of 1.3).  Medical History: Past Medical History  Diagnosis Date  . PERSONALITY DISORDER   . Chronic pain syndrome     chronic narcotics, dependancy hx - [place] for pain mgmt  . MIGRAINE HEADACHE   . Lyme disease   . ALLERGIC RHINITIS   . ANEMIA-IRON DEFICIENCY   . HYPERLIPIDEMIA   . GERD   . DIABETES MELLITUS, TYPE II   . DEPRESSION   . SLE (systemic lupus erythematosus)     [facility] - chronic pred  . Psoriasis   . Addison's disease   . Degenerative joint disease   . DDD (degenerative disc disease)   . Phlebitis after infusion     left ?calf; "S/P phenergan/demerol injection"  . Blood clot in vein 2011; 02/04/11    "right jugular vein;left jugular vein"  . SEIZURE DISORDER     "lots of seizures; due to lupus"  . Anxiety   . Neuropathy     hands, feet, due to diabetes & back problem  . Surgical wound infection  02/15/2011    R shoulder and mastectomy site - related to PICC  . CARCINOMA, BREAST 08/2009 dx    R breast,  mets to liver - resolution s/p chemo, s/p B mastect  . Metastases to the liver   . RA (rheumatoid arthritis)   . Hypothyroidism pt states she has never had hy pothyroidism  . Hypertension    Assessment: 58 y/o F with left thigh cellulitis (has h/o this in the past). WBC WNL, Scr up from last visit at 1.3, other labs as above.   Goal of Therapy:  Vancomycin trough level 10-15 mcg/ml  Plan:  -Vancomycin 1000 mg IV x 1 already given, then 750 mg IV q12h, may increase dose if SCr normalizes  -Trend WBC, temp, renal function  -Drug levels as indicated   Narda Bonds 12/21/2013,2:40 AM

## 2013-12-21 NOTE — Progress Notes (Signed)
Patient Demographics  [name] Carr, is a 58 y.o. female, DOB - 04-Apr-1955, AQT:[id number]  Admit date - 12/20/2013   Admitting Physician Theressa Millard, MD  Outpatient Primary MD for the patient is PARUCHURI,VAMSEE, MD  LOS - 1   Chief Complaint  Patient presents with  . Cellulitis      Admission history of present illness/brief narrative:  [name] Carr is a 58 y.o. female with Past medical history of SLE, chronic pain syndrome, hypertension, history of diabetes, Sjogren, Addison's disease, chronic leg swelling, possible diastolic dysfunction. The patient is presenting with complaints of left eye redness. She mentions only was he started noticing some swelling at the left thigh which was progressively worsening with developing a redness of the surrounding area. Since last one day she has also noticed a bump with blackish discoloration. She denies any injury or trauma to the hot high but she complains that she had a fall which was mechanical 2 weeks ago and has been having sciatica like pain on the right side since that on and off. She has been ambulating appropriately until now. She denies any fever and chills denies any chest pain or shortness of breath or cough. Denies any diarrhea or constipation.   Subjective:   [name] has, No headache, No chest pain, No abdominal pain - No Nausea, No new weakness tingling or numbness, No Cough - SOB.   Assessment & Plan    Principal Problem:   Abscess and cellulitis Active Problems:   Malignant neoplasm of female breast   Type 2 diabetes mellitus   Chronic pain syndrome   Immunocompromised patient   SLE-on prednisone-Follow at [facility]   Neuropathy   Hypertension  Abscess and cellulitis -The patient is presenting with complaints of left thigh redness and swelling. She has prior history of cellulitis in  the same area with Streptococcus. - follow cultures and continue her on vancomycin and Zosyn. - follow ultrasound of the local site and if there is any pus collection then surgery would be consulted. Pain management as needed.  history of lupus. -Continue prednisone.  diabetes mellitus. - on sliding scale.  chronic pain management. -Continue with when necessary Dilaudid  Neuropathy. -Continue gabapentin.  Hypertension. -Continue home medications.   Code Status: full  Family Communication: agent is alert and oriented  Disposition Plan: remains inpatient  Procedures  none   Consults   none   Medications  Scheduled Meds: . carisoprodol  350 mg Oral TID  . docusate sodium  200 mg Oral BID  . DULoxetine  60 mg Oral BID  . famotidine (PEPCID) IV  20 mg Intravenous Q12H  . furosemide  40 mg Oral Daily  . gabapentin  800 mg Oral TID  . heparin  5,000 Units Subcutaneous 3 times per day  . hydrochlorothiazide  25 mg Oral Daily  . pantoprazole  40 mg Oral Daily  . piperacillin-tazobactam (ZOSYN)  IV  3.375 g Intravenous 3 times per day  . potassium chloride SA  20 mEq Oral BID  . predniSONE  7.5 mg Oral Q breakfast  . rOPINIRole  1 mg Oral QHS  . vancomycin  750 mg Intravenous Q12H   Continuous Infusions:  PRN Meds:.acetaminophen, alum & mag hydroxide-simeth, bisacodyl,  clonazePAM, HYDROcodone-acetaminophen, hydrocortisone sod succinate (SOLU-CORTEF) inj, HYDROmorphone (DILAUDID) injection, morphine injection, ondansetron **OR** ondansetron (ZOFRAN) IV, promethazine, senna-docusate, traZODone  DVT Prophylaxis   Heparin -  Lab Results  Component Value Date   PLT 204 12/21/2013    Antibiotics    Anti-infectives    Start     Dose/Rate Route Frequency Ordered Stop   12/21/13 1200  vancomycin (VANCOCIN) IVPB 750 mg/150 ml premix     750 mg150 mL/hr over 60 Minutes Intravenous Every 12 hours 12/21/13 0248     12/21/13 0430  piperacillin-tazobactam (ZOSYN) IVPB  3.375 g     3.375 g12.5 mL/hr over 240 Minutes Intravenous 3 times per day 12/21/13 0415     12/20/13 2345  vancomycin (VANCOCIN) IVPB 1000 mg/200 mL premix     1,000 mg200 mL/hr over 60 Minutes Intravenous  Once 12/20/13 2344 12/21/13 0147          Objective:   Filed Vitals:   12/20/13 2202 12/21/13 0054 12/21/13 0231 12/21/13 0611  BP: 130/59 91/45 141/69 101/64  Pulse: 79 72 71 72  Temp: 98.8 F (37.1 C) 98.5 F (36.9 C) 97.8 F (36.6 C) 97.9 F (36.6 C)  TempSrc: Oral Oral Oral Oral  Resp: 16 18 20 18   Height:   5\' 4"  (1.626 m)   Weight: 83.915 kg (185 lb)  83.9 kg (184 lb 15.5 oz)   SpO2: 96% 95% 97% 95%    Wt Readings from Last 3 Encounters:  12/21/13 83.9 kg (184 lb 15.5 oz)  09/12/13 83.915 kg (185 lb)  09/11/13 85.276 kg (188 lb)    No intake or output data in the 24 hours ending 12/21/13 1323   Physical Exam  Awake Alert, Oriented X 3, No new F.N deficits, Normal affect .AT,PERRAL Supple Neck,No JVD, No cervical lymphadenopathy appriciated.  Symmetrical Chest wall movement, Good air movement bilaterally, CTAB RRR,No Gallops,Rubs or new Murmurs, No Parasternal Heave +ve B.Sounds, Abd Soft, No tenderness, No organomegaly appriciated, No rebound - guarding or rigidity. No Cyanosis, Clubbing or edema, left thigh lateral side tenderness erythema and  small area of induration   Data Review   Micro Results No results found for this or any previous visit (from the past 240 hour(s)).  Radiology Reports No results found.  CBC  Recent Labs Lab 12/20/13 2300 12/21/13 0528  WBC 9.3 7.9  HGB 10.4* 11.3*  HCT 32.4* 35.4*  PLT 192 204  MCV 93.1 92.9  MCH 29.9 29.7  MCHC 32.1 31.9  RDW 13.8 14.2  LYMPHSABS 1.3  --   MONOABS 0.7  --   EOSABS 0.2  --   BASOSABS 0.0  --     Chemistries   Recent Labs Lab 12/20/13 2300 12/21/13 0528  NA 140 137  K 3.5* 3.5*  CL 99 96  CO2 29 26  GLUCOSE 133* 147*  BUN 18 16  CREATININE 1.30* 1.05  CALCIUM  8.8 8.7  AST  --  15  ALT  --  12  ALKPHOS  --  88  BILITOT  --  0.3   ------------------------------------------------------------------------------------------------------------------ estimated creatinine clearance is 60.5 mL/min (by C-G formula based on Cr of 1.05). ------------------------------------------------------------------------------------------------------------------ No results for input(s): HGBA1C in the last 72 hours. ------------------------------------------------------------------------------------------------------------------ No results for input(s): CHOL, HDL, LDLCALC, TRIG, CHOLHDL, LDLDIRECT in the last 72 hours. ------------------------------------------------------------------------------------------------------------------ No results for input(s): TSH, T4TOTAL, T3FREE, THYROIDAB in the last 72 hours.  Invalid input(s): FREET3 ------------------------------------------------------------------------------------------------------------------ No results for input(s): VITAMINB12, FOLATE, FERRITIN, TIBC, IRON, RETICCTPCT in  the last 72 hours.  Coagulation profile  Recent Labs Lab 12/21/13 0528  INR 1.13    No results for input(s): DDIMER in the last 72 hours.  Cardiac Enzymes No results for input(s): CKMB, TROPONINI, MYOGLOBIN in the last 168 hours.  Invalid input(s): CK ------------------------------------------------------------------------------------------------------------------ Invalid input(s): POCBNP     Time Spent in minutes  25 minutes   Finlay Mills M.D on 12/21/2013 at 1:23 PM  Between 7am to 7pm - Pager - [phone]  After 7pm go to [email or web address] - password [email or web address]  And look for the night coverage person covering for me after hours  [facility] Office  [phone]   **Disclaimer: This note may have been dictated with voice recognition software. Similar sounding words can inadvertently be transcribed and this  note may contain transcription errors which may not have been corrected upon publication of note.**

## 2013-12-22 ENCOUNTER — Encounter (HOSPITAL_COMMUNITY)
Admission: EM | Disposition: A | Discharge status: Home Health | Payer: Self-pay | Source: Home / Self Care | Attending: Internal Medicine

## 2013-12-22 ENCOUNTER — Inpatient Hospital Stay (HOSPITAL_COMMUNITY): Payer: Medicare Other | Admitting: Anesthesiology

## 2013-12-22 HISTORY — PX: INCISION AND DRAINAGE ABSCESS: SHX5864

## 2013-12-22 LAB — CBC
HCT: 33.4 % — ABNORMAL LOW (ref 36.0–46.0)
Hemoglobin: 10.9 g/dL — ABNORMAL LOW (ref 12.0–15.0)
MCH: 30.2 pg (ref 26.0–34.0)
MCHC: 32.6 g/dL (ref 30.0–36.0)
MCV: 92.5 fL (ref 78.0–100.0)
PLATELETS: 200 10*3/uL (ref 150–400)
RBC: 3.61 MIL/uL — AB (ref 3.87–5.11)
RDW: 14.2 % (ref 11.5–15.5)
WBC: 7.8 10*3/uL (ref 4.0–10.5)

## 2013-12-22 LAB — BASIC METABOLIC PANEL
ANION GAP: 11 (ref 5–15)
BUN: 12 mg/dL (ref 6–23)
CALCIUM: 8.8 mg/dL (ref 8.4–10.5)
CHLORIDE: 93 meq/L — AB (ref 96–112)
CO2: 32 mEq/L (ref 19–32)
Creatinine, Ser: 0.94 mg/dL (ref 0.50–1.10)
GFR calc Af Amer: 75 mL/min — ABNORMAL LOW (ref >90)
GFR calc non Af Amer: 65 mL/min — ABNORMAL LOW (ref >90)
GLUCOSE: 111 mg/dL — AB (ref 70–99)
Potassium: 3.1 mEq/L — ABNORMAL LOW (ref 3.7–5.3)
Sodium: 136 mEq/L — ABNORMAL LOW (ref 137–147)

## 2013-12-22 LAB — GLUCOSE, CAPILLARY
Glucose-Capillary: 87 mg/dL (ref 70–99)
Glucose-Capillary: 85 mg/dL (ref 70–99)

## 2013-12-22 SURGERY — INCISION AND DRAINAGE, ABSCESS
Anesthesia: General | Site: Thigh | Laterality: Left

## 2013-12-22 MED ORDER — NYSTATIN 100000 UNIT/GM EX POWD
Freq: Two times a day (BID) | CUTANEOUS | Status: DC
  Administered 2013-12-23 – 2013-12-29 (×12): via TOPICAL
  Filled 2013-12-22 (×2): qty 15

## 2013-12-22 MED ORDER — HYDROMORPHONE HCL 1 MG/ML IJ SOLN: 0.2500 mg | INTRAMUSCULAR | Status: DC | PRN

## 2013-12-22 MED ORDER — VANCOMYCIN HCL IN DEXTROSE 1-5 GM/200ML-% IV SOLN
1000.0000 mg | Freq: Two times a day (BID) | INTRAVENOUS | Status: DC
  Administered 2013-12-22 – 2013-12-26 (×8): 1000 mg via INTRAVENOUS
  Filled 2013-12-22 (×9): qty 200

## 2013-12-22 MED ORDER — 0.9 % SODIUM CHLORIDE (POUR BTL) OPTIME
TOPICAL | Status: DC | PRN
  Administered 2013-12-22: 1000 mL

## 2013-12-22 MED ORDER — PROPOFOL 10 MG/ML IV BOLUS
INTRAVENOUS | Status: DC | PRN
  Administered 2013-12-22: 100 mg via INTRAVENOUS
  Administered 2013-12-22 (×2): 50 mg via INTRAVENOUS

## 2013-12-22 MED ORDER — MIDAZOLAM HCL 2 MG/2ML IJ SOLN
INTRAMUSCULAR | Status: AC
  Filled 2013-12-22: qty 2

## 2013-12-22 MED ORDER — FENTANYL CITRATE 0.05 MG/ML IJ SOLN
INTRAMUSCULAR | Status: AC
  Filled 2013-12-22: qty 5

## 2013-12-22 MED ORDER — HYDROMORPHONE HCL 1 MG/ML IJ SOLN
INTRAMUSCULAR | Status: AC
  Administered 2013-12-22: 2 mg via INTRAVENOUS
  Filled 2013-12-22: qty 1

## 2013-12-22 MED ORDER — EPHEDRINE SULFATE 50 MG/ML IJ SOLN
INTRAMUSCULAR | Status: AC
  Filled 2013-12-22: qty 1

## 2013-12-22 MED ORDER — LACTATED RINGERS IV SOLN
INTRAVENOUS | Status: DC | PRN
  Administered 2013-12-22: 10:00:00 via INTRAVENOUS

## 2013-12-22 MED ORDER — ARTIFICIAL TEARS OP OINT
TOPICAL_OINTMENT | OPHTHALMIC | Status: AC
  Filled 2013-12-22: qty 3.5

## 2013-12-22 MED ORDER — OXYCODONE HCL 5 MG/5ML PO SOLN: 5.0000 mg | Freq: Once | ORAL | Status: DC | PRN

## 2013-12-22 MED ORDER — PROPOFOL 10 MG/ML IV BOLUS
INTRAVENOUS | Status: AC
  Filled 2013-12-22: qty 20

## 2013-12-22 MED ORDER — POTASSIUM CHLORIDE CRYS ER 20 MEQ PO TBCR
20.0000 meq | EXTENDED_RELEASE_TABLET | Freq: Once | ORAL | Status: AC
  Administered 2013-12-22: 20 meq via ORAL

## 2013-12-22 MED ORDER — OXYCODONE HCL 5 MG PO TABS
5.0000 mg | ORAL_TABLET | Freq: Once | ORAL | Status: DC | PRN
Start: 2013-12-22 — End: 2013-12-22

## 2013-12-22 MED ORDER — RISAQUAD PO CAPS
2.0000 | ORAL_CAPSULE | Freq: Every day | ORAL | Status: DC
  Administered 2013-12-23 – 2013-12-29 (×7): 2 via ORAL
  Filled 2013-12-22 (×8): qty 2

## 2013-12-22 MED ORDER — ONDANSETRON HCL 4 MG/2ML IJ SOLN
INTRAMUSCULAR | Status: AC
  Filled 2013-12-22: qty 2

## 2013-12-22 MED ORDER — ROCURONIUM BROMIDE 50 MG/5ML IV SOLN
INTRAVENOUS | Status: AC
  Filled 2013-12-22: qty 1

## 2013-12-22 MED ORDER — SODIUM CHLORIDE 0.9 % IV SOLN
INTRAVENOUS | Status: DC
  Administered 2013-12-22: 15:00:00 via INTRAVENOUS

## 2013-12-22 MED ORDER — BUPIVACAINE HCL (PF) 0.25 % IJ SOLN
INTRAMUSCULAR | Status: AC
  Filled 2013-12-22: qty 30

## 2013-12-22 MED ORDER — ONDANSETRON HCL 4 MG/2ML IJ SOLN
INTRAMUSCULAR | Status: DC | PRN
  Administered 2013-12-22: 4 mg via INTRAVENOUS

## 2013-12-22 MED ORDER — LIDOCAINE HCL (CARDIAC) 20 MG/ML IV SOLN
INTRAVENOUS | Status: DC | PRN
  Administered 2013-12-22: 60 mg via INTRAVENOUS

## 2013-12-22 MED ORDER — ARTIFICIAL TEARS OP OINT
TOPICAL_OINTMENT | OPHTHALMIC | Status: DC | PRN
  Administered 2013-12-22: 1 via OPHTHALMIC

## 2013-12-22 MED ORDER — LIDOCAINE HCL (CARDIAC) 20 MG/ML IV SOLN
INTRAVENOUS | Status: AC
  Filled 2013-12-22: qty 5

## 2013-12-22 MED ORDER — OXYCODONE HCL 5 MG PO TABS: 5.0000 mg | ORAL_TABLET | Freq: Once | ORAL | Status: DC | PRN

## 2013-12-22 MED ORDER — POTASSIUM CHLORIDE 20 MEQ/15ML (10%) PO SOLN
40.0000 meq | Freq: Once | ORAL | Status: AC
  Administered 2013-12-22: 40 meq via ORAL
  Filled 2013-12-22: qty 30

## 2013-12-22 MED ORDER — SUCCINYLCHOLINE CHLORIDE 20 MG/ML IJ SOLN
INTRAMUSCULAR | Status: AC
  Filled 2013-12-22: qty 1

## 2013-12-22 MED ORDER — SODIUM CHLORIDE 0.9 % IJ SOLN
INTRAMUSCULAR | Status: AC
  Filled 2013-12-22: qty 10

## 2013-12-22 MED ORDER — BUPIVACAINE HCL (PF) 0.25 % IJ SOLN
INTRAMUSCULAR | Status: DC | PRN
  Administered 2013-12-22: 9 mL

## 2013-12-22 MED ORDER — HYDROMORPHONE HCL 1 MG/ML IJ SOLN
INTRAMUSCULAR | Status: AC
  Administered 2013-12-22: 1 mg via INTRAVENOUS
  Filled 2013-12-22: qty 1

## 2013-12-22 MED ORDER — FUROSEMIDE 40 MG PO TABS
40.0000 mg | ORAL_TABLET | Freq: Every day | ORAL | Status: DC
  Administered 2013-12-24 – 2013-12-28 (×5): 40 mg via ORAL
  Filled 2013-12-22 (×5): qty 1

## 2013-12-22 MED ORDER — HYDROMORPHONE HCL 1 MG/ML IJ SOLN
1.0000 mg | INTRAMUSCULAR | Status: DC | PRN
Start: 2013-12-22 — End: 2013-12-24
  Administered 2013-12-22 – 2013-12-24 (×13): 2 mg via INTRAVENOUS
  Filled 2013-12-22 (×3): qty 2
  Filled 2013-12-22: qty 1
  Filled 2013-12-22 (×8): qty 2
  Filled 2013-12-22: qty 1
  Filled 2013-12-22: qty 2

## 2013-12-22 MED ORDER — PROMETHAZINE HCL 25 MG/ML IJ SOLN
6.2500 mg | INTRAMUSCULAR | Status: DC | PRN
Start: 2013-12-22 — End: 2013-12-27

## 2013-12-22 MED ORDER — HYDROMORPHONE HCL 1 MG/ML IJ SOLN
0.2500 mg | INTRAMUSCULAR | Status: DC | PRN
  Administered 2013-12-22 (×4): 0.5 mg via INTRAVENOUS

## 2013-12-22 MED ORDER — FENTANYL CITRATE 0.05 MG/ML IJ SOLN
INTRAMUSCULAR | Status: DC | PRN
  Administered 2013-12-22 (×3): 25 ug via INTRAVENOUS
  Administered 2013-12-22: 50 ug via INTRAVENOUS

## 2013-12-22 SURGICAL SUPPLY — 30 items
BLADE SURG ROTATE 9660 (MISCELLANEOUS) IMPLANT
BNDG GAUZE ELAST 4 BULKY (GAUZE/BANDAGES/DRESSINGS) IMPLANT
CANISTER SUCTION 2500CC (MISCELLANEOUS) ×3 IMPLANT
COVER SURGICAL LIGHT HANDLE (MISCELLANEOUS) ×3 IMPLANT
DRAPE LAPAROSCOPIC ABDOMINAL (DRAPES) IMPLANT
DRAPE PED LAPAROTOMY (DRAPES) IMPLANT
DRSG PAD ABDOMINAL 8X10 ST (GAUZE/BANDAGES/DRESSINGS) IMPLANT
ELECT CAUTERY BLADE 6.4 (BLADE) ×2 IMPLANT
ELECT REM PT RETURN 9FT ADLT (ELECTROSURGICAL) ×3
ELECTRODE REM PT RTRN 9FT ADLT (ELECTROSURGICAL) ×1 IMPLANT
GAUZE SPONGE 4X4 12PLY STRL (GAUZE/BANDAGES/DRESSINGS) ×2 IMPLANT
GLOVE BIO SURGEON STRL SZ7 (GLOVE) ×3 IMPLANT
GLOVE BIOGEL PI IND STRL 7.5 (GLOVE) ×1 IMPLANT
GLOVE BIOGEL PI INDICATOR 7.5 (GLOVE) ×2
GOWN STRL REUS W/ TWL LRG LVL3 (GOWN DISPOSABLE) ×2 IMPLANT
GOWN STRL REUS W/TWL LRG LVL3 (GOWN DISPOSABLE) ×6
KIT BASIN OR (CUSTOM PROCEDURE TRAY) ×3 IMPLANT
KIT ROOM TURNOVER OR (KITS) ×3 IMPLANT
NDL HYPO 25GX1X1/2 BEV (NEEDLE) IMPLANT
NEEDLE HYPO 25GX1X1/2 BEV (NEEDLE) ×3 IMPLANT
NS IRRIG 1000ML POUR BTL (IV SOLUTION) ×3 IMPLANT
PACK GENERAL/GYN (CUSTOM PROCEDURE TRAY) ×3 IMPLANT
PAD ABD 8X10 STRL (GAUZE/BANDAGES/DRESSINGS) ×2 IMPLANT
PAD ARMBOARD 7.5X6 YLW CONV (MISCELLANEOUS) ×3 IMPLANT
SWAB COLLECTION DEVICE MRSA (MISCELLANEOUS) ×2 IMPLANT
SYR CONTROL 10ML LL (SYRINGE) ×2 IMPLANT
TAPE CLOTH SURG 6X10 WHT LF (GAUZE/BANDAGES/DRESSINGS) ×2 IMPLANT
TOWEL OR 17X24 6PK STRL BLUE (TOWEL DISPOSABLE) ×3 IMPLANT
TOWEL OR 17X26 10 PK STRL BLUE (TOWEL DISPOSABLE) ×3 IMPLANT
TUBE ANAEROBIC SPECIMEN COL (MISCELLANEOUS) ×2 IMPLANT

## 2013-12-22 NOTE — Transfer of Care (Signed)
Immediate Anesthesia Transfer of Care Note  Patient: [name] Carr  Procedure(s) Performed: Procedure(s): INCISION AND DRAINAGE ABSCESS OF LEFT THIGH (Left)  Patient Location: PACU  Anesthesia Type:General  Level of Consciousness: awake, alert , oriented and sedated  Airway & Oxygen Therapy: Patient Spontanous Breathing and Patient connected to nasal cannula oxygen  Post-op Assessment: Report given to PACU RN, Post -op Vital signs reviewed and stable and Patient moving all extremities X 4  Post vital signs: Reviewed and stable  Complications: No apparent anesthesia complications

## 2013-12-22 NOTE — Progress Notes (Signed)
Patient Demographics  [name] Carr, is a 58 y.o. female, DOB - 09/20/1955, YKD:[id number]  Admit date - 12/20/2013   Admitting Physician Theressa Millard, MD  Outpatient Primary MD for the patient is PARUCHURI,VAMSEE, MD  LOS - 2   Chief Complaint  Patient presents with  . Cellulitis      Admission history of present illness/brief narrative:  [name] Carr is a 58 y.o. female with Past medical history of SLE, chronic pain syndrome, hypertension, history of diabetes, Sjogren, Addison's disease, chronic leg swelling, possible diastolic dysfunction. The patient is presenting with complaints of left eye redness. She mentions only was he started noticing some swelling at the left thigh which was progressively worsening with developing a redness of the surrounding area. Since last one day she has also noticed a bump with blackish discoloration. She denies any injury or trauma to the hot high but she complains that she had a fall which was mechanical 2 weeks ago and has been having sciatica like pain on the right side since that on and off. She has been ambulating appropriately until now. She denies any fever and chills denies any chest pain or shortness of breath or cough. Denies any diarrhea or constipation.  Shunt ultrasound of left thigh done on 12/21/13 showing left thigh abscess, patient underwent incision and drainage on 12/22/13  Subjective:   [name] Carr today has, No headache, No chest pain, No abdominal pain - No Nausea, No new weakness tingling or numbness, No Cough - SOB.   Assessment & Plan    Principal Problem:   Abscess and cellulitis Active Problems:   Malignant neoplasm of female breast   Type 2 diabetes mellitus   Chronic pain syndrome   Immunocompromised patient   SLE-on prednisone-Follow at [facility]   Neuropathy   Hypertension  Abscess and  cellulitis -The patient is presenting with complaints of left thigh redness and swelling. She has prior history of cellulitis in the same area with Streptococcus. -that is post I&D on 12/22/13 by Dr. Brantley Stage. - follow cultures and continue her on vancomycin and Zosyn. Pain management as needed.  history of lupus. -Continue prednisone.  diabetes mellitus. - on sliding scale.  chronic pain management. -Continue with when necessary Dilaudid  Neuropathy. -Continue gabapentin.  Hypertension. -Continue home medications.   Code Status: full  Family Communication: agent is alert and oriented  Disposition Plan: remains inpatient  Procedures  none   Consults   none   Medications  Scheduled Meds: . acidophilus  2 capsule Oral Daily  . carisoprodol  350 mg Oral TID  . docusate sodium  200 mg Oral BID  . DULoxetine  60 mg Oral BID  . [START ON 12/24/2013] furosemide  40 mg Oral Daily  . gabapentin  800 mg Oral TID  . heparin  5,000 Units Subcutaneous 3 times per day  . hydrochlorothiazide  25 mg Oral Daily  . HYDROmorphone      . nystatin   Topical BID  . pantoprazole  40 mg Oral Daily  . piperacillin-tazobactam (ZOSYN)  IV  3.375 g Intravenous 3 times per day  . potassium chloride SA  20 mEq Oral BID  . potassium chloride  20 mEq Oral Once  . predniSONE  7.5 mg Oral Q breakfast  . rOPINIRole  1 mg Oral QHS  . vancomycin  1,000 mg Intravenous Q12H   Continuous Infusions: . sodium chloride     PRN Meds:.acetaminophen, alum & mag hydroxide-simeth, bisacodyl, clonazePAM, HYDROcodone-acetaminophen, HYDROmorphone (DILAUDID) injection, ondansetron **OR** ondansetron (ZOFRAN) IV, promethazine, promethazine, senna-docusate, sodium chloride, traZODone  DVT Prophylaxis   Heparin -  Lab Results  Component Value Date   PLT 200 12/22/2013    Antibiotics    Anti-infectives    Start     Dose/Rate Route Frequency Ordered Stop   12/22/13 1200  vancomycin (VANCOCIN) IVPB  1000 mg/200 mL premix     1,000 mg200 mL/hr over 60 Minutes Intravenous Every 12 hours 12/22/13 0818     12/21/13 1200  vancomycin (VANCOCIN) IVPB 750 mg/150 ml premix  Status:  Discontinued     750 mg150 mL/hr over 60 Minutes Intravenous Every 12 hours 12/21/13 0248 12/22/13 0817   12/21/13 0430  piperacillin-tazobactam (ZOSYN) IVPB 3.375 g     3.375 g12.5 mL/hr over 240 Minutes Intravenous 3 times per day 12/21/13 0415     12/20/13 2345  vancomycin (VANCOCIN) IVPB 1000 mg/200 mL premix     1,000 mg200 mL/hr over 60 Minutes Intravenous  Once 12/20/13 2344 12/21/13 0147          Objective:   Filed Vitals:   12/22/13 1110 12/22/13 1125 12/22/13 1258 12/22/13 1448  BP: 118/55 91/74 106/51 100/46  Pulse: 72 74 82 83  Temp:  98.3 F (36.8 C) 98.5 F (36.9 C) 99.4 F (37.4 C)  TempSrc:   Oral Axillary  Resp: 13 10 18 15   Height:      Weight:      SpO2: 99% 98% 100% 91%    Wt Readings from Last 3 Encounters:  12/22/13 86.8 kg (191 lb 5.8 oz)  09/12/13 83.915 kg (185 lb)  09/11/13 85.276 kg (188 lb)     Intake/Output Summary (Last 24 hours) at 12/22/13 1512 Last data filed at 12/22/13 1043  Gross per 24 hour  Intake    500 ml  Output      1 ml  Net    499 ml     Physical Exam  Awake Alert, Oriented X 3, No new F.N deficits, Normal affect [place].AT,PERRAL Supple Neck,No JVD, No cervical lymphadenopathy appriciated.  Symmetrical Chest wall movement, Good air movement bilaterally, CTAB RRR,No Gallops,Rubs or new Murmurs, No Parasternal Heave +ve B.Sounds, Abd Soft, No tenderness, No organomegaly appriciated, No rebound - guarding or rigidity. No Cyanosis, Clubbing or edema, left thigh lateral side advantage at the site of incision and drainage.   Data Review   Micro Results Recent Results (from the past 240 hour(s))  Surgical pcr screen     Status: Abnormal   Collection Time: 12/21/13  5:15 PM  Result Value Ref Range Status   MRSA, PCR POSITIVE (A) NEGATIVE Final    Staphylococcus aureus POSITIVE (A) NEGATIVE Final    Comment:        The Xpert SA Assay (FDA approved for NASAL specimens in patients over 27 years of age), is one component of a comprehensive surveillance program.  Test performance has been validated by [employer] for patients greater than or equal to 46 year old. It is not intended to diagnose infection nor to guide or monitor treatment.     Radiology Reports [place] Extrem Low Left Ltd  12/21/2013   CLINICAL DATA:  59 year old female with upper left thigh swelling and redness.  History of previous abscess in this area 6 months ago.  EXAM: ULTRASOUND LEFT LOWER EXTREMITY LIMITED  TECHNIQUE: Ultrasound examination of the lower extremity soft tissues was performed in the area of clinical concern.  COMPARISON:  08/23/2013 CT 07/05/2013 ultrasound.  FINDINGS: Two separate hypoechoic areas within the upper left thigh in the area of redness are identified without internal vascular flow and suspicious for abscess/ infected collection or hematoma.  The more superficial hypoechoic area measures 4.1 x 2.5 x 4.1 cm and the deeper hypoechoic area measures 3.6 x 1.7 x 3.1 cm.  Overlying subcutaneous edema is noted.  IMPRESSION: Two separate hypoechoic areas within the upper left thigh underlying the area of redness suspicious for abscess/infected collection or possibly hematoma. The more superficial area measures 4.1 x 2.5 x 4.1 cm and the deeper area measures 3.6 x 1.7 x 3.1 cm.   Electronically Signed   By: Hassan Rowan M.D.   On: 12/21/2013 13:33    CBC  Recent Labs Lab 12/20/13 2300 12/21/13 0528 12/22/13 0555  WBC 9.3 7.9 7.8  HGB 10.4* 11.3* 10.9*  HCT 32.4* 35.4* 33.4*  PLT 192 204 200  MCV 93.1 92.9 92.5  MCH 29.9 29.7 30.2  MCHC 32.1 31.9 32.6  RDW 13.8 14.2 14.2  LYMPHSABS 1.3  --   --   MONOABS 0.7  --   --   EOSABS 0.2  --   --   BASOSABS 0.0  --   --     Chemistries   Recent Labs Lab 12/20/13 2300 12/21/13 0528  12/22/13 0555  NA 140 137 136*  K 3.5* 3.5* 3.1*  CL 99 96 93*  CO2 29 26 32  GLUCOSE 133* 147* 111*  BUN 18 16 12   CREATININE 1.30* 1.05 0.94  CALCIUM 8.8 8.7 8.8  AST  --  15  --   ALT  --  12  --   ALKPHOS  --  88  --   BILITOT  --  0.3  --    ------------------------------------------------------------------------------------------------------------------ estimated creatinine clearance is 68.7 mL/min (by C-G formula based on Cr of 0.94). ------------------------------------------------------------------------------------------------------------------ No results for input(s): HGBA1C in the last 72 hours. ------------------------------------------------------------------------------------------------------------------ No results for input(s): CHOL, HDL, LDLCALC, TRIG, CHOLHDL, LDLDIRECT in the last 72 hours. ------------------------------------------------------------------------------------------------------------------ No results for input(s): TSH, T4TOTAL, T3FREE, THYROIDAB in the last 72 hours.  Invalid input(s): FREET3 ------------------------------------------------------------------------------------------------------------------ No results for input(s): VITAMINB12, FOLATE, FERRITIN, TIBC, IRON, RETICCTPCT in the last 72 hours.  Coagulation profile  Recent Labs Lab 12/21/13 0528  INR 1.13    No results for input(s): DDIMER in the last 72 hours.  Cardiac Enzymes No results for input(s): CKMB, TROPONINI, MYOGLOBIN in the last 168 hours.  Invalid input(s): CK ------------------------------------------------------------------------------------------------------------------ Invalid input(s): POCBNP     Time Spent in minutes  25 minutes   ELGERGAWY, DAWOOD M.D on 12/22/2013 at 3:12 PM  Between 7am to 7pm - Pager - [phone]  After 7pm go to [email or web address] - password [email or web address]  And look for the night coverage person covering for me after hours  [facility] Office  [phone]   **Disclaimer: This note may have been dictated with voice recognition software. Similar sounding words can inadvertently be transcribed and this note may contain transcription errors which may not have been corrected upon publication of note.**

## 2013-12-22 NOTE — Anesthesia Preprocedure Evaluation (Addendum)
Anesthesia Evaluation  Patient identified by MRN, date of birth, ID band Patient awake    Reviewed: Allergy & Precautions, H&P , NPO status , Patient's Chart, lab work & pertinent test results, reviewed documented beta blocker date and time   Airway Mallampati: I  TM Distance: >3 FB Neck ROM: Full    Dental  (+) Edentulous Upper, Edentulous Lower   Pulmonary          Cardiovascular hypertension, + Peripheral Vascular Disease     Neuro/Psych  Headaches, Seizures -,  Anxiety Depression Last seizure 30 yrs ago    GI/Hepatic GERD-  ,  Endo/Other  diabetes, Type 2Hypothyroidism   Renal/GU Renal disease     Musculoskeletal  (+) Arthritis -, on steriods ,  lupus   Abdominal   Peds  Hematology  (+) anemia ,   Anesthesia Other Findings   Reproductive/Obstetrics                         Anesthesia Physical Anesthesia Plan  ASA: III  Anesthesia Plan:    Post-op Pain Management:    Induction:   Airway Management Planned:   Additional Equipment:   Intra-op Plan:   Post-operative Plan:   Informed Consent:   Plan Discussed with:   Anesthesia Plan Comments:         Anesthesia Quick Evaluation

## 2013-12-22 NOTE — Anesthesia Procedure Notes (Signed)
Procedure Name: LMA Insertion Date/Time: 12/22/2013 9:57 AM Performed by: Gaylene Brooks Pre-anesthesia Checklist: Patient identified, Timeout performed, Emergency Drugs available, Suction available and Patient being monitored Patient Re-evaluated:Patient Re-evaluated prior to inductionOxygen Delivery Method: Circle system utilized Preoxygenation: Pre-oxygenation with 100% oxygen Intubation Type: IV induction LMA: LMA inserted LMA Size: 4.0 Number of attempts: 1 Placement Confirmation: positive ETCO2 and breath sounds checked- equal and bilateral Tube secured with: Tape Dental Injury: Teeth and Oropharynx as per pre-operative assessment

## 2013-12-22 NOTE — Anesthesia Postprocedure Evaluation (Signed)
  Anesthesia Post-op Note  Patient: [name] Carr  Procedure(s) Performed: Procedure(s): INCISION AND DRAINAGE ABSCESS OF LEFT THIGH (Left)  Patient Location: PACU  Anesthesia Type:General  Level of Consciousness: awake  Airway and Oxygen Therapy: Patient Spontanous Breathing and Patient connected to nasal cannula oxygen  Post-op Pain: mild  Post-op Assessment: Post-op Vital signs reviewed, Patient's Cardiovascular Status Stable, Respiratory Function Stable, Patent Airway, No signs of Nausea or vomiting and Pain level controlled  Post-op Vital Signs: Reviewed and stable  Last Vitals:  Filed Vitals:   12/22/13 1258  BP: 106/51  Pulse: 82  Temp: 36.9 C  Resp: 18    Complications: No apparent anesthesia complications

## 2013-12-22 NOTE — Progress Notes (Signed)
Day of Surgery  Subjective: Pt sleeping no complaints  Objective: Vital signs in last 24 hours: Temp:  [98.2 F (36.8 C)-99.4 F (37.4 C)] 98.8 F (37.1 C) (11/15 0544) Pulse Rate:  [73-85] 73 (11/15 0544) Resp:  [16] 16 (11/15 0544) BP: (110-124)/(50-61) 124/50 mmHg (11/15 0544) SpO2:  [96 %] 96 % (11/15 0544) Weight:  [191 lb 5.8 oz (86.8 kg)] 191 lb 5.8 oz (86.8 kg) (11/15 0544) Last BM Date: 12/21/13  Intake/Output from previous day: 11/14 0701 - 11/15 0700 In: 480 [P.O.:480] Out: 5 [Urine:5] Intake/Output this shift:    Incision/Wound:left thigh swelling redness notes with serous drainage 5 cm   Lab Results:   Recent Labs  12/21/13 0528 12/22/13 0555  WBC 7.9 7.8  HGB 11.3* 10.9*  HCT 35.4* 33.4*  PLT 204 200   BMET  Recent Labs  12/21/13 0528 12/22/13 0555  NA 137 136*  K 3.5* 3.1*  CL 96 93*  CO2 26 32  GLUCOSE 147* 111*  BUN 16 12  CREATININE 1.05 0.94  CALCIUM 8.7 8.8   PT/INR  Recent Labs  12/21/13 0528  LABPROT 14.6  INR 1.13   ABG No results for input(s): PHART, HCO3 in the last 72 hours.  Invalid input(s): PCO2, PO2  Studies/Results: [place] Extrem Low Left Ltd  12/21/2013   CLINICAL DATA:  58 year old female with upper left thigh swelling and redness. History of previous abscess in this area 6 months ago.  EXAM: ULTRASOUND LEFT LOWER EXTREMITY LIMITED  TECHNIQUE: Ultrasound examination of the lower extremity soft tissues was performed in the area of clinical concern.  COMPARISON:  08/23/2013 CT 07/05/2013 ultrasound.  FINDINGS: Two separate hypoechoic areas within the upper left thigh in the area of redness are identified without internal vascular flow and suspicious for abscess/ infected collection or hematoma.  The more superficial hypoechoic area measures 4.1 x 2.5 x 4.1 cm and the deeper hypoechoic area measures 3.6 x 1.7 x 3.1 cm.  Overlying subcutaneous edema is noted.  IMPRESSION: Two separate hypoechoic areas within the upper left  thigh underlying the area of redness suspicious for abscess/infected collection or possibly hematoma. The more superficial area measures 4.1 x 2.5 x 4.1 cm and the deeper area measures 3.6 x 1.7 x 3.1 cm.   Electronically Signed   By: Hassan Rowan M.D.   On: 12/21/2013 13:33    Anti-infectives: Anti-infectives    Start     Dose/Rate Route Frequency Ordered Stop   12/22/13 1200  vancomycin (VANCOCIN) IVPB 1000 mg/200 mL premix     1,000 mg200 mL/hr over 60 Minutes Intravenous Every 12 hours 12/22/13 0818     12/21/13 1200  vancomycin (VANCOCIN) IVPB 750 mg/150 ml premix  Status:  Discontinued     750 mg150 mL/hr over 60 Minutes Intravenous Every 12 hours 12/21/13 0248 12/22/13 0817   12/21/13 0430  piperacillin-tazobactam (ZOSYN) IVPB 3.375 g     3.375 g12.5 mL/hr over 240 Minutes Intravenous 3 times per day 12/21/13 0415     12/20/13 2345  vancomycin (VANCOCIN) IVPB 1000 mg/200 mL premix     1,000 mg200 mL/hr over 60 Minutes Intravenous  Once 12/20/13 2344 12/21/13 0147      Assessment/Plan: s/p to OR for I and D left thigh abscess.  The procedure has been discussed with the patient.  Alternative therapies have been discussed with the patient.  Operative risks include bleeding,  Infection,  Organ injury,  Nerve injury,  Blood vessel injury,  DVT,  Pulmonary  embolism,  Death,  And possible reoperation.  Medical management risks include worsening of present situation.  The success of the procedure is 50 -90 % at treating patients symptoms.  The patient understands and agrees to proceed.   LOS: 2 days    Ahmaya Ostermiller A. 12/22/2013

## 2013-12-22 NOTE — Op Note (Signed)
Preoperative diagnosis: Left thigh abscess  Postoperative diagnosis: same  Procedure: Incision and drainage of left thigh abscess  Surgeon: Erroll Luna M.D.  Anesthesia: LMA with 0.25% Sensorcaine local plain  EBL: Minimal  Specimen: Cultures of abscess taken  Indications for procedure: The patient presents to the operating room due to a large left thigh superficial abscess. She is in manage medically with antibiotics and this is not improved. She presents for incision and drainage today.The procedure has been discussed with the patient.  Alternative therapies have been discussed with the patient.  Operative risks include bleeding,  Infection,  Organ injury,  Nerve injury,  Blood vessel injury,  DVT,  Pulmonary embolism,  Death,  And possible reoperation.  Medical management risks include worsening of present situation.  The success of the procedure is 50 -90 % at treating patients symptoms.  The patient understands and agrees to proceed.  Description of procedure: Patient met in holding area and questions answered. Left thigh was marked as correct side. Patient taken back to the operating room and placed supine on the OR table. After induction of general anesthesia, left thigh was prepped and draped in a sterile fashion. Timeout was done to verify proper procedure, patient and location. Left thigh abscess opened with scalpel and cautery. This was superficial. There were some complex loculations broken up bluntly. The area measured 4 x 3 x 3 cm. This was irrigated with copious amounts of saline. Hemostasis achieved and wound packed with saline soaked gauze. Dry dressing applied. All final counts correct. Patient was taken to recovery in satisfactory condition.

## 2013-12-23 ENCOUNTER — Encounter (HOSPITAL_COMMUNITY): Payer: Self-pay | Admitting: Surgery

## 2013-12-23 LAB — CBC
HEMATOCRIT: 33.1 % — AB (ref 36.0–46.0)
HEMOGLOBIN: 10.5 g/dL — AB (ref 12.0–15.0)
MCH: 29.9 pg (ref 26.0–34.0)
MCHC: 31.7 g/dL (ref 30.0–36.0)
MCV: 94.3 fL (ref 78.0–100.0)
Platelets: 194 10*3/uL (ref 150–400)
RBC: 3.51 MIL/uL — ABNORMAL LOW (ref 3.87–5.11)
RDW: 14.4 % (ref 11.5–15.5)
WBC: 8.9 10*3/uL (ref 4.0–10.5)

## 2013-12-23 LAB — BASIC METABOLIC PANEL
Anion gap: 10 (ref 5–15)
BUN: 13 mg/dL (ref 6–23)
CHLORIDE: 96 meq/L (ref 96–112)
CO2: 34 mEq/L — ABNORMAL HIGH (ref 19–32)
Calcium: 8.8 mg/dL (ref 8.4–10.5)
Creatinine, Ser: 1.04 mg/dL (ref 0.50–1.10)
GFR calc non Af Amer: 58 mL/min — ABNORMAL LOW (ref >90)
GFR, EST AFRICAN AMERICAN: 67 mL/min — AB (ref >90)
GLUCOSE: 110 mg/dL — AB (ref 70–99)
Potassium: 3.4 mEq/L — ABNORMAL LOW (ref 3.7–5.3)
Sodium: 140 mEq/L (ref 137–147)

## 2013-12-23 NOTE — Care Management Note (Addendum)
    Page 1 of 2   12/29/2013     2:45:43 PM CARE MANAGEMENT NOTE 12/29/2013  Patient:  RANDY, WHITENER   Account Number:  [id number]  Date Initiated:  12/23/2013  Documentation initiated by:  Magdalen Spatz  Subjective/Objective Assessment:   adm: Abscess and cellulitis left thigh     Action/Plan:   discharge planning   Anticipated DC Date:  12/28/2013   Anticipated DC Plan:  [place]  CM consult      [facility] Choice  HOME HEALTH   Choice offered to / List presented to:  C-1 Patient        [place] arranged  HH-1 RN  [place].   Status of service:   Medicare Important Message given?  YES (If response is "NO", the following Medicare IM given date fields will be blank) Date Medicare IM given:  12/27/2013 Medicare IM given by:  Magdalen Spatz Date Additional Medicare IM given:  12/25/2013 Additional Medicare IM given by:  Magdalen Spatz  Discharge Disposition:  [place]  Per UR Regulation:  Reviewed for med. necessity/level of care/duration of stay  If discussed at [place] of Stay Meetings, dates discussed:   12/26/2013    Comments:  12/29/13 08:30 CM received call from [facility] rep, STephanie to please check for IV ABX prescription.  CM faxed prescription to [facility] pharmacy.  Pt will receive morning run here at the hospital and [facility] will have [place] this evening at the pt's hme for the evening run of ABX.  No other CM needs were communicated. Mariane Masters, BSN, Cm [phone].  12-23-13 Confirmed face sheet information with patient. Magdalen Spatz RN BSN [phone]

## 2013-12-23 NOTE — Progress Notes (Signed)
Patient ID: [name] Carr, female   DOB: 10-01-1955, 58 y.o.   MRN: [id number] 1 Day Post-Op  Subjective: Pt c/o some pain, but controlled.  Objective: Vital signs in last 24 hours: Temp:  [97.5 F (36.4 C)-99.4 F (37.4 C)] 99.2 F (37.3 C) (11/16 0523) Pulse Rate:  [72-83] 82 (11/16 0523) Resp:  [10-18] 17 (11/16 0523) BP: (91-141)/(46-74) 115/55 mmHg (11/16 0523) SpO2:  [91 %-100 %] 99 % (11/16 0523) Last BM Date: 12/21/13  Intake/Output from previous day: 11/15 0701 - 11/16 0700 In: 500 [I.V.:500] Out: -  Intake/Output this shift:    PE: Skin: left thigh still with a significant amount of induration, but minimal cellulitis, wound is clean and repacked.  Lab Results:   Recent Labs  12/22/13 0555 12/23/13 0650  WBC 7.8 8.9  HGB 10.9* 10.5*  HCT 33.4* 33.1*  PLT 200 194   BMET  Recent Labs  12/22/13 0555 12/23/13 0650  NA 136* 140  K 3.1* 3.4*  CL 93* 96  CO2 32 34*  GLUCOSE 111* 110*  BUN 12 13  CREATININE 0.94 1.04  CALCIUM 8.8 8.8   PT/INR  Recent Labs  12/21/13 0528  LABPROT 14.6  INR 1.13   CMP     Component Value Date/Time   NA 140 12/23/2013 0650   NA 135 04/25/2013 1439   NA 135* 04/08/2013 1148   K 3.4* 12/23/2013 0650   K 3.5 04/25/2013 1439   K 4.6 04/08/2013 1148   CL 96 12/23/2013 0650   CL 90* 04/25/2013 1439   CL 91* 03/15/2012 1410   CO2 34* 12/23/2013 0650   CO2 32 04/25/2013 1439   CO2 28 04/08/2013 1148   GLUCOSE 110* 12/23/2013 0650   GLUCOSE 201* 04/25/2013 1439   GLUCOSE 268* 04/08/2013 1148   GLUCOSE 131* 03/15/2012 1410   BUN 13 12/23/2013 0650   BUN 26* 04/25/2013 1439   BUN 29.9* 04/08/2013 1148   CREATININE 1.04 12/23/2013 0650   CREATININE 0.94 09/11/2013 1718   CREATININE 1.2* 04/08/2013 1148   CALCIUM 8.8 12/23/2013 0650   CALCIUM 9.9 04/25/2013 1439   CALCIUM 9.6 04/08/2013 1148   PROT 6.4 12/21/2013 0528   PROT 8.5* 04/25/2013 1439   PROT 6.9 04/08/2013 1148   ALBUMIN 3.2* 12/21/2013 0528   ALBUMIN 3.4* 04/08/2013 1148   AST 15 12/21/2013 0528   AST 21 04/25/2013 1439   AST 17 04/08/2013 1148   ALT 12 12/21/2013 0528   ALT 36 04/25/2013 1439   ALT 18 04/08/2013 1148   ALKPHOS 88 12/21/2013 0528   ALKPHOS 102* 04/25/2013 1439   ALKPHOS 100 04/08/2013 1148   BILITOT 0.3 12/21/2013 0528   BILITOT 0.70 04/25/2013 1439   BILITOT 0.34 04/08/2013 1148   GFRNONAA 58* 12/23/2013 0650   GFRNONAA 67 09/11/2013 1718   GFRAA 67* 12/23/2013 0650   GFRAA 77 09/11/2013 1718   Lipase     Component Value Date/Time   LIPASE 25 08/09/2010 1749       Studies/Results: [place] Extrem Low Left Ltd  12/21/2013   CLINICAL DATA:  58 year old female with upper left thigh swelling and redness. History of previous abscess in this area 6 months ago.  EXAM: ULTRASOUND LEFT LOWER EXTREMITY LIMITED  TECHNIQUE: Ultrasound examination of the lower extremity soft tissues was performed in the area of clinical concern.  COMPARISON:  08/23/2013 CT 07/05/2013 ultrasound.  FINDINGS: Two separate hypoechoic areas within the upper left thigh in the area of redness are identified without  internal vascular flow and suspicious for abscess/ infected collection or hematoma.  The more superficial hypoechoic area measures 4.1 x 2.5 x 4.1 cm and the deeper hypoechoic area measures 3.6 x 1.7 x 3.1 cm.  Overlying subcutaneous edema is noted.  IMPRESSION: Two separate hypoechoic areas within the upper left thigh underlying the area of redness suspicious for abscess/infected collection or possibly hematoma. The more superficial area measures 4.1 x 2.5 x 4.1 cm and the deeper area measures 3.6 x 1.7 x 3.1 cm.   Electronically Signed   By: Hassan Rowan M.D.   On: 12/21/2013 13:33    Anti-infectives: Anti-infectives    Start     Dose/Rate Route Frequency Ordered Stop   12/22/13 1200  vancomycin (VANCOCIN) IVPB 1000 mg/200 mL premix     1,000 mg200 mL/hr over 60 Minutes Intravenous Every 12 hours 12/22/13 0818     12/21/13 1200   vancomycin (VANCOCIN) IVPB 750 mg/150 ml premix  Status:  Discontinued     750 mg150 mL/hr over 60 Minutes Intravenous Every 12 hours 12/21/13 0248 12/22/13 0817   12/21/13 0430  piperacillin-tazobactam (ZOSYN) IVPB 3.375 g     3.375 g12.5 mL/hr over 240 Minutes Intravenous 3 times per day 12/21/13 0415     12/20/13 2345  vancomycin (VANCOCIN) IVPB 1000 mg/200 mL premix     1,000 mg200 mL/hr over 60 Minutes Intravenous  Once 12/20/13 2344 12/21/13 0147       Assessment/Plan  1. POD 1, s/p left thigh I&D of abscess  Plan: 1. Cont IV abx therapy for at least another day due to significant amount of induration.  Start BID dressing changes. 2. HH arranged for patient as well.   LOS: 3 days    Berneda Piccininni E 12/23/2013, 10:14 AM Pager: [phone]

## 2013-12-23 NOTE — Plan of Care (Signed)
Problem: Phase II Progression Outcomes Goal: Vital signs stable Outcome: Completed/Met Date Met:  12/23/13 Goal: Dressings dry/intact Outcome: Completed/Met Date Met:  12/23/13 Goal: Return of bowel function (flatus, BM) IF ABDOMINAL SURGERY:  Outcome: Completed/Met Date Met:  12/23/13 Goal: Foley discontinued Outcome: Completed/Met Date Met:  12/23/13 Goal: Discharge plan established Outcome: Completed/Met Date Met:  12/23/13 Goal: Tolerating diet Outcome: Completed/Met Date Met:  12/23/13

## 2013-12-23 NOTE — Progress Notes (Signed)
Patient Demographics  [name] Carr, is a 58 y.o. female, DOB - [birth year], DTO:[id number]  Admit date - 12/20/2013   Admitting Physician Theressa Millard, MD  Outpatient Primary MD for the patient is PARUCHURI,VAMSEE, MD  LOS - 3   Chief Complaint  Patient presents with  . Cellulitis      Admission history of present illness/brief narrative:  [name] Carr is a 57 y.o. female with Past medical history of SLE, chronic pain syndrome, hypertension, history of diabetes, Sjogren, Addison's disease, chronic leg swelling, possible diastolic dysfunction. The patient is presenting with complaints of left eye redness. She mentions only was he started noticing some swelling at the left thigh which was progressively worsening with developing a redness of the surrounding area. Since last one day she has also noticed a bump with blackish discoloration. She denies any injury or trauma to the hot high but she complains that she had a fall which was mechanical 2 weeks ago and has been having sciatica like pain on the right side since that on and off. She has been ambulating appropriately until now. She denies any fever and chills denies any chest pain or shortness of breath or cough. Denies any diarrhea or constipation.  Shunt ultrasound of left thigh done on 12/21/13 showing left thigh abscess, patient underwent incision and drainage on 12/22/13  Subjective:   [name] Carr has, No headache, No chest pain, No abdominal pain - No Nausea, No new weakness tingling or numbness, No Cough - SOB.   Assessment & Plan    Principal Problem:   Abscess and cellulitis Active Problems:   Malignant neoplasm of female breast   Type 2 diabetes mellitus   Chronic pain syndrome   Immunocompromised patient   SLE-on prednisone-Follow at [facility]   Neuropathy   Hypertension  Abscess and  cellulitis -The patient is presenting with complaints of left thigh redness and swelling. She has prior history of cellulitis in the same area with Streptococcus in the past. -that is post I&D on 12/22/13 by Dr. Brantley Stage. - ontinue her on vancomycin and Zosyn for at least 1 more day as per surgical recommendation -Pain management as needed. -Culture and Gram stain still pending.  history of lupus. -Continue prednisone.  diabetes mellitus. - on sliding scale.  chronic pain management. -Continue with when necessary Dilaudid  Neuropathy. -Continue gabapentin.  Hypertension. -Continue home medications.   Code Status: full  Family Communication: agent is alert and oriented  Disposition Plan: remains inpatient  Procedures  none   Consults   none   Medications  Scheduled Meds: . acidophilus  2 capsule Oral Daily  . carisoprodol  350 mg Oral TID  . docusate sodium  200 mg Oral BID  . DULoxetine  60 mg Oral BID  . [START ON 12/24/2013] furosemide  40 mg Oral Daily  . gabapentin  800 mg Oral TID  . heparin  5,000 Units Subcutaneous 3 times per day  . hydrochlorothiazide  25 mg Oral Daily  . nystatin   Topical BID  . pantoprazole  40 mg Oral Daily  . piperacillin-tazobactam (ZOSYN)  IV  3.375 g Intravenous 3 times per day  . potassium chloride SA  20 mEq Oral BID  . predniSONE  7.5 mg Oral Q breakfast  . rOPINIRole  1 mg Oral QHS  . vancomycin  1,000 mg Intravenous Q12H   Continuous Infusions:   PRN Meds:.acetaminophen, alum & mag hydroxide-simeth, bisacodyl, clonazePAM, HYDROcodone-acetaminophen, HYDROmorphone (DILAUDID) injection, ondansetron **OR** ondansetron (ZOFRAN) IV, promethazine, promethazine, senna-docusate, sodium chloride, traZODone  DVT Prophylaxis   Heparin -  Lab Results  Component Value Date   PLT 194 12/23/2013    Antibiotics    Anti-infectives    Start     Dose/Rate Route Frequency Ordered Stop   12/22/13 1200  vancomycin (VANCOCIN) IVPB  1000 mg/200 mL premix     1,000 mg200 mL/hr over 60 Minutes Intravenous Every 12 hours 12/22/13 0818     12/21/13 1200  vancomycin (VANCOCIN) IVPB 750 mg/150 ml premix  Status:  Discontinued     750 mg150 mL/hr over 60 Minutes Intravenous Every 12 hours 12/21/13 0248 12/22/13 0817   12/21/13 0430  piperacillin-tazobactam (ZOSYN) IVPB 3.375 g     3.375 g12.5 mL/hr over 240 Minutes Intravenous 3 times per day 12/21/13 0415     12/20/13 2345  vancomycin (VANCOCIN) IVPB 1000 mg/200 mL premix     1,000 mg200 mL/hr over 60 Minutes Intravenous  Once 12/20/13 2344 12/21/13 0147          Objective:   Filed Vitals:   12/22/13 2136 12/23/13 0125 12/23/13 0523 12/23/13 1052  BP: 141/57 109/52 115/55 116/46  Pulse: 72 80 82 79  Temp: 97.5 F (36.4 C) 98.2 F (36.8 C) 99.2 F (37.3 C) 98.3 F (36.8 C)  TempSrc: Oral Oral Oral Oral  Resp: 16 17 17 15   Height:      Weight:      SpO2: 100% 99% 99% 90%    Wt Readings from Last 3 Encounters:  12/22/13 86.8 kg (191 lb 5.8 oz)  09/12/13 83.915 kg (185 lb)  09/11/13 85.276 kg (188 lb)     Intake/Output Summary (Last 24 hours) at 12/23/13 1320 Last data filed at 12/23/13 1053  Gross per 24 hour  Intake    360 ml  Output      0 ml  Net    360 ml     Physical Exam  Awake Alert, Oriented X 3, No new F.N deficits, Normal affect [place].AT,PERRAL Supple Neck,No JVD, No cervical lymphadenopathy appriciated.  Symmetrical Chest wall movement, Good air movement bilaterally, CTAB RRR,No Gallops,Rubs or new Murmurs, No Parasternal Heave +ve B.Sounds, Abd Soft, No tenderness, No organomegaly appriciated, No rebound - guarding or rigidity. No Cyanosis, Clubbing or edema, left thigh lateral side pandaged at the site of incision and drainage.   Data Review   Micro Results Recent Results (from the past 240 hour(s))  Surgical pcr screen     Status: Abnormal   Collection Time: 12/21/13  5:15 PM  Result Value Ref Range Status   MRSA, PCR POSITIVE  (A) NEGATIVE Final   Staphylococcus aureus POSITIVE (A) NEGATIVE Final    Comment:        The Xpert SA Assay (FDA approved for NASAL specimens in patients over 78 years of age), is one component of a comprehensive surveillance program.  Test performance has been validated by [employer] for patients greater than or equal to 35 year old. It is not intended to diagnose infection nor to guide or monitor treatment.   Anaerobic culture     Status: None (Preliminary result)   Collection Time: 12/22/13 10:13 AM  Result Value Ref Range Status   Specimen Description  PENDING  Incomplete   Special Requests PENDING  Incomplete   Gram Stain PENDING  Incomplete   Culture   Final    NO ANAEROBES ISOLATED; CULTURE IN PROGRESS FOR 5 DAYS Performed at [facility]    Report Status PENDING  Incomplete  Culture, routine-abscess     Status: None (Preliminary result)   Collection Time: 12/22/13 10:13 AM  Result Value Ref Range Status   Specimen Description ABSCESS LEFT THIGH  Final   Special Requests NONE  Final   Gram Stain PENDING  Incomplete   Culture NO GROWTH Performed at [facility]   Final   Report Status PENDING  Incomplete    Radiology Reports No results found.  CBC  Recent Labs Lab 12/20/13 2300 12/21/13 0528 12/22/13 0555 12/23/13 0650  WBC 9.3 7.9 7.8 8.9  HGB 10.4* 11.3* 10.9* 10.5*  HCT 32.4* 35.4* 33.4* 33.1*  PLT 192 204 200 194  MCV 93.1 92.9 92.5 94.3  MCH 29.9 29.7 30.2 29.9  MCHC 32.1 31.9 32.6 31.7  RDW 13.8 14.2 14.2 14.4  LYMPHSABS 1.3  --   --   --   MONOABS 0.7  --   --   --   EOSABS 0.2  --   --   --   BASOSABS 0.0  --   --   --     Chemistries   Recent Labs Lab 12/20/13 2300 12/21/13 0528 12/22/13 0555 12/23/13 0650  NA 140 137 136* 140  K 3.5* 3.5* 3.1* 3.4*  CL 99 96 93* 96  CO2 29 26 32 34*  GLUCOSE 133* 147* 111* 110*  BUN 18 16 12 13   CREATININE 1.30* 1.05 0.94 1.04  CALCIUM 8.8 8.7 8.8 8.8  AST  --  15  --    --   ALT  --  12  --   --   ALKPHOS  --  88  --   --   BILITOT  --  0.3  --   --    ------------------------------------------------------------------------------------------------------------------ estimated creatinine clearance is 62.1 mL/min (by C-G formula based on Cr of 1.04). ------------------------------------------------------------------------------------------------------------------ No results for input(s): HGBA1C in the last 72 hours. ------------------------------------------------------------------------------------------------------------------ No results for input(s): CHOL, HDL, LDLCALC, TRIG, CHOLHDL, LDLDIRECT in the last 72 hours. ------------------------------------------------------------------------------------------------------------------ No results for input(s): TSH, T4TOTAL, T3FREE, THYROIDAB in the last 72 hours.  Invalid input(s): FREET3 ------------------------------------------------------------------------------------------------------------------ No results for input(s): VITAMINB12, FOLATE, FERRITIN, TIBC, IRON, RETICCTPCT in the last 72 hours.  Coagulation profile  Recent Labs Lab 12/21/13 0528  INR 1.13    No results for input(s): DDIMER in the last 72 hours.  Cardiac Enzymes No results for input(s): CKMB, TROPONINI, MYOGLOBIN in the last 168 hours.  Invalid input(s): CK ------------------------------------------------------------------------------------------------------------------ Invalid input(s): POCBNP     Time Spent in minutes  25 minutes   [name] Carr M.D on 12/23/2013 at 1:20 PM  Between 7am to 7pm - Pager - [phone]  After 7pm go to [email or web address] - password [email or web address]  And look for the night coverage person covering for me after hours  [facility] Office  [phone]   **Disclaimer: This note may have been dictated with voice recognition software. Similar sounding words can inadvertently be transcribed  and this note may contain transcription errors which may not have been corrected upon publication of note.**

## 2013-12-24 LAB — BASIC METABOLIC PANEL
Anion gap: 13 (ref 5–15)
BUN: 14 mg/dL (ref 6–23)
CALCIUM: 9.2 mg/dL (ref 8.4–10.5)
CO2: 33 meq/L — AB (ref 19–32)
Chloride: 93 mEq/L — ABNORMAL LOW (ref 96–112)
Creatinine, Ser: 0.95 mg/dL (ref 0.50–1.10)
GFR calc Af Amer: 75 mL/min — ABNORMAL LOW (ref >90)
GFR calc non Af Amer: 64 mL/min — ABNORMAL LOW (ref >90)
GLUCOSE: 108 mg/dL — AB (ref 70–99)
Potassium: 3.5 mEq/L — ABNORMAL LOW (ref 3.7–5.3)
SODIUM: 139 meq/L (ref 137–147)

## 2013-12-24 LAB — CBC
HEMATOCRIT: 33.8 % — AB (ref 36.0–46.0)
Hemoglobin: 10.6 g/dL — ABNORMAL LOW (ref 12.0–15.0)
MCH: 28.9 pg (ref 26.0–34.0)
MCHC: 31.4 g/dL (ref 30.0–36.0)
MCV: 92.1 fL (ref 78.0–100.0)
Platelets: 223 10*3/uL (ref 150–400)
RBC: 3.67 MIL/uL — AB (ref 3.87–5.11)
RDW: 14.2 % (ref 11.5–15.5)
WBC: 7.8 10*3/uL (ref 4.0–10.5)

## 2013-12-24 MED ORDER — HYDROMORPHONE HCL 1 MG/ML IJ SOLN: 0.5000 mg | INTRAMUSCULAR | Status: DC | PRN

## 2013-12-24 MED ORDER — HYDROMORPHONE HCL 1 MG/ML IJ SOLN
3.0000 mg | Freq: Once | INTRAMUSCULAR | Status: AC
  Administered 2013-12-24: 3 mg via INTRAVENOUS
  Filled 2013-12-24: qty 3

## 2013-12-24 MED ORDER — HYDROMORPHONE HCL 1 MG/ML IJ SOLN
1.0000 mg | INTRAMUSCULAR | Status: DC | PRN
Start: 2013-12-24 — End: 2013-12-25
  Administered 2013-12-24 – 2013-12-25 (×4): 2 mg via INTRAVENOUS
  Filled 2013-12-24 (×4): qty 2

## 2013-12-24 NOTE — Progress Notes (Signed)
ANTIBIOTIC CONSULT NOTE  Pharmacy Consult for Vancomycin and Zosyn Indication: Cellulitis  Allergies  Allergen Reactions  . Ammonia Other (See Comments)    Ended up on ventilator from ammonia tabs  . Contrast Media [Iodinated Diagnostic Agents] Other (See Comments)    Doesn't breath well.  . Iohexol Other (See Comments)    SOB   . Methotrexate Anaphylaxis  . Midazolam Hcl Anaphylaxis  . Nalbuphine Other (See Comments)    Can't breathe well  . Infliximab Nausea And Vomiting  . Ketorolac Tromethamine Other (See Comments)    Injectable doesn't work and pill hurts stomach.  . Morphine And Related Hives  . Nsaids Nausea And Vomiting  . Celecoxib Rash    Patient Measurements: Height: 5\' 4"  (162.6 cm) Weight: 198 lb (89.812 kg) IBW/kg (Calculated) : 54.7  Vital Signs: Temp: 98 Carr (36.7 C) (11/17 0514) Temp Source: Oral (11/17 0514) BP: 111/58 mmHg (11/17 0514) Pulse Rate: [name] (11/17 0514)  Labs:  Recent Labs  12/22/13 0555 12/23/13 0650 12/24/13 0510  WBC 7.8 8.9 7.8  HGB 10.9* 10.5* 10.6*  PLT 200 194 223  CREATININE 0.94 1.04 0.95   Estimated Creatinine Clearance: 69.2 mL/min (by C-G formula based on Cr of 0.95).  Assessment: 58 y/o Carr with left thigh cellulitis (has h/o this in the past) now s/p I&D on 11/15. Still with some induration/cellulitis around wound. WBC WNL, afebrile. No growth on abscess culture. SCr improved to 0.95 and has been stable.   Goal of Therapy:  Vancomycin trough level 10-15 mcg/ml  Plan:  1. Vancomycin 1000mg  IV q12h 2. Zosyn 3.375g IV q8h 3. Follow c/s, renal function, clinical progression, and LOT 4. Drug levels as indicated  Shamiya Demeritt D. Nayden Czajka, PharmD, BCPS Clinical Pharmacist Pager: [phone] 12/24/2013 1:17 PM

## 2013-12-24 NOTE — Progress Notes (Signed)
Patient ID: [name] Carr, female   DOB: 26-Aug-1955, 58 y.o.   MRN: [id number] 2 Days Post-Op  Subjective: Pt still having a lot of pain today.  Migrated to superior to her incised wound.  Objective: Vital signs in last 24 hours: Temp:  [98 F (36.7 C)-98.7 F (37.1 C)] 98 F (36.7 C) (11/17 0514) Pulse Rate:  [62-87] 65 (11/17 0514) Resp:  [16] 16 (11/17 0514) BP: (92-149)/(53-94) 111/58 mmHg (11/17 0514) SpO2:  [90 %-98 %] 95 % (11/17 0514) Weight:  [198 lb (89.812 kg)] 198 lb (89.812 kg) (11/17 0514) Last BM Date: 12/24/13  Intake/Output from previous day: 11/16 0701 - 11/17 0700 In: 2620 [P.O.:1320; IV Piggyback:1300] Out: 2 [Urine:1; Stool:1] Intake/Output this shift:    PE: Skin: wound itself is clean, still with significant induration and pain to touch just superior or incised wound.  Still with some slight erythema.  Lab Results:   Recent Labs  12/23/13 0650 12/24/13 0510  WBC 8.9 7.8  HGB 10.5* 10.6*  HCT 33.1* 33.8*  PLT 194 223   BMET  Recent Labs  12/23/13 0650 12/24/13 0510  NA 140 139  K 3.4* 3.5*  CL 96 93*  CO2 34* 33*  GLUCOSE 110* 108*  BUN 13 14  CREATININE 1.04 0.95  CALCIUM 8.8 9.2   PT/INR No results for input(s): LABPROT, INR in the last 72 hours. CMP     Component Value Date/Time   NA 139 12/24/2013 0510   NA 135 04/25/2013 1439   NA 135* 04/08/2013 1148   K 3.5* 12/24/2013 0510   K 3.5 04/25/2013 1439   K 4.6 04/08/2013 1148   CL 93* 12/24/2013 0510   CL 90* 04/25/2013 1439   CL 91* 03/15/2012 1410   CO2 33* 12/24/2013 0510   CO2 32 04/25/2013 1439   CO2 28 04/08/2013 1148   GLUCOSE 108* 12/24/2013 0510   GLUCOSE 201* 04/25/2013 1439   GLUCOSE 268* 04/08/2013 1148   GLUCOSE 131* 03/15/2012 1410   BUN 14 12/24/2013 0510   BUN 26* 04/25/2013 1439   BUN 29.9* 04/08/2013 1148   CREATININE 0.95 12/24/2013 0510   CREATININE 0.94 09/11/2013 1718   CREATININE 1.2* 04/08/2013 1148   CALCIUM 9.2 12/24/2013 0510   CALCIUM  9.9 04/25/2013 1439   CALCIUM 9.6 04/08/2013 1148   PROT 6.4 12/21/2013 0528   PROT 8.5* 04/25/2013 1439   PROT 6.9 04/08/2013 1148   ALBUMIN 3.2* 12/21/2013 0528   ALBUMIN 3.4* 04/08/2013 1148   AST 15 12/21/2013 0528   AST 21 04/25/2013 1439   AST 17 04/08/2013 1148   ALT 12 12/21/2013 0528   ALT 36 04/25/2013 1439   ALT 18 04/08/2013 1148   ALKPHOS 88 12/21/2013 0528   ALKPHOS 102* 04/25/2013 1439   ALKPHOS 100 04/08/2013 1148   BILITOT 0.3 12/21/2013 0528   BILITOT 0.70 04/25/2013 1439   BILITOT 0.34 04/08/2013 1148   GFRNONAA 64* 12/24/2013 0510   GFRNONAA 67 09/11/2013 1718   GFRAA 75* 12/24/2013 0510   GFRAA 77 09/11/2013 1718   Lipase     Component Value Date/Time   LIPASE 25 08/09/2010 1749       Studies/Results: No results found.  Anti-infectives: Anti-infectives    Start     Dose/Rate Route Frequency Ordered Stop   12/22/13 1200  vancomycin (VANCOCIN) IVPB 1000 mg/200 mL premix     1,000 mg200 mL/hr over 60 Minutes Intravenous Every 12 hours 12/22/13 0818     12/21/13 1200  vancomycin (VANCOCIN) IVPB 750 mg/150 ml premix  Status:  Discontinued     750 mg150 mL/hr over 60 Minutes Intravenous Every 12 hours 12/21/13 0248 12/22/13 0817   12/21/13 0430  piperacillin-tazobactam (ZOSYN) IVPB 3.375 g     3.375 g12.5 mL/hr over 240 Minutes Intravenous 3 times per day 12/21/13 0415     12/20/13 2345  vancomycin (VANCOCIN) IVPB 1000 mg/200 mL premix     1,000 mg200 mL/hr over 60 Minutes Intravenous  Once 12/20/13 2344 12/21/13 0147       Assessment/Plan  1. POD 2, s/p I&D of left thigh abscess, cultures show NGTD  Plan: 1. Cont BID dressing changes.  Wound is clean, but still with induration and some cellulitis with pain superior to incised wound.  Cont IV abx therapy today.  Do not see anything that needs further drainage at this time.  LOS: 4 days    Tyshon Fanning E 12/24/2013, 11:18 AM Pager: [phone]

## 2013-12-24 NOTE — Progress Notes (Signed)
Patient Demographics  [name] Carr, is a 58 y.o. female, DOB - 05/13/1955, XBD:[id number]  Admit date - 12/20/2013   Admitting Physician Theressa Millard, MD  Outpatient Primary MD for the patient is PARUCHURI,VAMSEE, MD  LOS - 4   Chief Complaint  Patient presents with  . Cellulitis      Admission history of present illness/brief narrative:  [name] Carr is a 58 y.o. female with Past medical history of SLE, chronic pain syndrome, hypertension, history of diabetes, Sjogren, Addison's disease, chronic leg swelling, possible diastolic dysfunction. The patient is presenting with complaints of left eye redness. She mentions only was he started noticing some swelling at the left thigh which was progressively worsening with developing a redness of the surrounding area. Since last one day she has also noticed a bump with blackish discoloration. She denies any injury or trauma to the hot high but she complains that she had a fall which was mechanical 2 weeks ago and has been having sciatica like pain on the right side since that on and off. She has been ambulating appropriately until now. She denies any fever and chills denies any chest pain or shortness of breath or cough. Denies any diarrhea or constipation.   ultrasound of left thigh done on 12/21/13 showing left thigh abscess, patient underwent incision and drainage on 12/22/13, wound Gram stain growing +1 acid fast bacilli.  Subjective:   [name] Carr today has, No headache, No chest pain, No abdominal pain - No Nausea, No new weakness tingling or numbness, No Cough - SOB.   Assessment & Plan    Principal Problem:   Abscess and cellulitis Active Problems:   Malignant neoplasm of female breast   Type 2 diabetes mellitus   Chronic pain syndrome   Immunocompromised patient   SLE-on prednisone-Follow at [facility]    Neuropathy   Hypertension  Abscess and cellulitis -The patient is presenting with complaints of left thigh redness and swelling. She has prior history of cellulitis in the same area with Streptococcus in the past. -that is post I&D on 12/22/13 by Dr. Brantley Stage. - Continue her on vancomycin and Zosyn  -Pain management as needed. -culture still pending, but the Gram stain growing +1 acid fast bacilli, discussed with ID Dr. Graylon Good who will evaluate the patient in a.m.Marland Kitchen  history of lupus. -Continue prednisone.  diabetes mellitus. - on sliding scale.  chronic pain management. -Continue with when necessary Dilaudid  Neuropathy. -Continue gabapentin.  Hypertension. -Continue home medications.   Code Status: full  Family Communication: agent is alert and oriented  Disposition Plan: remains inpatient  Procedures  Incision and drainage of left thigh abscess 12/22/13   Consults   General surgery   Medications  Scheduled Meds: . acidophilus  2 capsule Oral Daily  . carisoprodol  350 mg Oral TID  . docusate sodium  200 mg Oral BID  . DULoxetine  60 mg Oral BID  . furosemide  40 mg Oral Daily  . gabapentin  800 mg Oral TID  . heparin  5,000 Units Subcutaneous 3 times per day  . hydrochlorothiazide  25 mg Oral Daily  . nystatin   Topical BID  . pantoprazole  40 mg Oral Daily  . piperacillin-tazobactam (ZOSYN)  IV  3.375 g Intravenous 3 times per day  . potassium chloride SA  20 mEq Oral BID  . predniSONE  7.5 mg Oral Q breakfast  . rOPINIRole  1 mg Oral QHS  . vancomycin  1,000 mg Intravenous Q12H   Continuous Infusions:   PRN Meds:.acetaminophen, alum & mag hydroxide-simeth, bisacodyl, clonazePAM, HYDROcodone-acetaminophen, HYDROmorphone (DILAUDID) injection, ondansetron **OR** ondansetron (ZOFRAN) IV, promethazine, promethazine, senna-docusate, sodium chloride, traZODone  DVT Prophylaxis   Heparin -  Lab Results  Component Value Date   PLT 223 12/24/2013     Antibiotics    Anti-infectives    Start     Dose/Rate Route Frequency Ordered Stop   12/22/13 1200  vancomycin (VANCOCIN) IVPB 1000 mg/200 mL premix     1,000 mg200 mL/hr over 60 Minutes Intravenous Every 12 hours 12/22/13 0818     12/21/13 1200  vancomycin (VANCOCIN) IVPB 750 mg/150 ml premix  Status:  Discontinued     750 mg150 mL/hr over 60 Minutes Intravenous Every 12 hours 12/21/13 0248 12/22/13 0817   12/21/13 0430  piperacillin-tazobactam (ZOSYN) IVPB 3.375 g     3.375 g12.5 mL/hr over 240 Minutes Intravenous 3 times per day 12/21/13 0415     12/20/13 2345  vancomycin (VANCOCIN) IVPB 1000 mg/200 mL premix     1,000 mg200 mL/hr over 60 Minutes Intravenous  Once 12/20/13 2344 12/21/13 0147          Objective:   Filed Vitals:   12/23/13 1751 12/23/13 2226 12/24/13 0514 12/24/13 1400  BP: 114/94 149/76 111/58 118/55  Pulse: 74 87 65 78  Temp: 98.7 F (37.1 C) 98 F (36.7 C) 98 F (36.7 C) 98.2 F (36.8 C)  TempSrc: Oral Oral Oral Oral  Resp:  16 16 16   Height:      Weight:   89.812 kg (198 lb)   SpO2: 93% 98% 95% 96%    Wt Readings from Last 3 Encounters:  12/24/13 89.812 kg (198 lb)  09/12/13 83.915 kg (185 lb)  09/11/13 85.276 kg (188 lb)     Intake/Output Summary (Last 24 hours) at 12/24/13 1835 Last data filed at 12/24/13 1700  Gross per 24 hour  Intake   1980 ml  Output      2 ml  Net   1978 ml     Physical Exam  Awake Alert, Oriented X 3, No new F.N deficits, Normal affect [name],PERRAL Supple Neck,No JVD, No cervical lymphadenopathy appriciated.  Symmetrical Chest wall movement, Good air movement bilaterally, CTAB RRR,No Gallops,Rubs or new Murmurs, No Parasternal Heave +ve B.Sounds, Abd Soft, No tenderness, No organomegaly appriciated, No rebound - guarding or rigidity. No Cyanosis, Clubbing or edema, left thigh lateral side pandaged at the site of incision and drainage.   Data Review   Micro Results Recent Results (from the past 240  hour(s))  Surgical pcr screen     Status: Abnormal   Collection Time: 12/21/13  5:15 PM  Result Value Ref Range Status   MRSA, PCR POSITIVE (A) NEGATIVE Final   Staphylococcus aureus POSITIVE (A) NEGATIVE Final    Comment:        The Xpert SA Assay (FDA approved for NASAL specimens in patients over 22 years of age), is one component of a comprehensive surveillance program.  Test performance has been validated by [employer] for patients greater than or equal to 49 year old. It is not intended to diagnose infection nor to guide or monitor treatment.   Anaerobic culture     Status:  None (Preliminary result)   Collection Time: 12/22/13 10:13 AM  Result Value Ref Range Status   Specimen Description PENDING  Incomplete   Special Requests PENDING  Incomplete   Gram Stain   Final    ABUNDANT WBC PRESENT,BOTH PMN AND MONONUCLEAR NO SQUAMOUS EPITHELIAL CELLS Carr NO ORGANISMS Carr Performed at [facility]    Culture   Final    NO ANAEROBES ISOLATED; CULTURE IN PROGRESS FOR 5 DAYS Performed at [facility]    Report Status PENDING  Incomplete  AFB culture with smear     Status: None (Preliminary result)   Collection Time: 12/22/13 10:13 AM  Result Value Ref Range Status   Specimen Description PENDING  Incomplete   Special Requests PENDING  Incomplete   Acid Fast Smear   Final    1+ ACID FAST BACILLI Carr CRITICAL RESULT CALLED TO, READ BACK BY AND VERIFIED WITH: DANA RN @ [phone] ON 12/24/13 BY WILSN CRITICAL RESULT CALLED TO, READ BACK BY AND VERIFIED WITH: [place] [facility] @ [phone] ON 12/24/13 BY WILSN Performed at [facility]    Culture   Final    CULTURE WILL BE EXAMINED FOR 6 WEEKS BEFORE ISSUING A FINAL REPORT Performed at [facility]    Report Status PENDING  Incomplete  Culture, routine-abscess     Status: None (Preliminary result)   Collection Time: 12/22/13 10:13 AM  Result Value Ref Range Status   Specimen Description ABSCESS LEFT  THIGH  Final   Special Requests NONE  Final   Gram Stain   Final    ABUNDANT WBC PRESENT,BOTH PMN AND MONONUCLEAR NO SQUAMOUS EPITHELIAL CELLS Carr NO ORGANISMS Carr Performed at [facility]    Culture   Final    NO GROWTH 1 DAY Performed at [facility]    Report Status PENDING  Incomplete    Radiology Reports No results found.  CBC  Recent Labs Lab 12/20/13 2300 12/21/13 0528 12/22/13 0555 12/23/13 0650 12/24/13 0510  WBC 9.3 7.9 7.8 8.9 7.8  HGB 10.4* 11.3* 10.9* 10.5* 10.6*  HCT 32.4* 35.4* 33.4* 33.1* 33.8*  PLT 192 204 200 194 223  MCV 93.1 92.9 92.5 94.3 92.1  MCH 29.9 29.7 30.2 29.9 28.9  MCHC 32.1 31.9 32.6 31.7 31.4  RDW 13.8 14.2 14.2 14.4 14.2  LYMPHSABS 1.3  --   --   --   --   MONOABS 0.7  --   --   --   --   EOSABS 0.2  --   --   --   --   BASOSABS 0.0  --   --   --   --     Chemistries   Recent Labs Lab 12/20/13 2300 12/21/13 0528 12/22/13 0555 12/23/13 0650 12/24/13 0510  NA 140 137 136* 140 139  K 3.5* 3.5* 3.1* 3.4* 3.5*  CL 99 96 93* 96 93*  CO2 29 26 32 34* 33*  GLUCOSE 133* 147* 111* 110* 108*  BUN 18 16 12 13 14   CREATININE 1.30* 1.05 0.94 1.04 0.95  CALCIUM 8.8 8.7 8.8 8.8 9.2  AST  --  15  --   --   --   ALT  --  12  --   --   --   ALKPHOS  --  88  --   --   --   BILITOT  --  0.3  --   --   --    ------------------------------------------------------------------------------------------------------------------ estimated  creatinine clearance is 69.2 mL/min (by C-G formula based on Cr of 0.95). ------------------------------------------------------------------------------------------------------------------ No results for input(s): HGBA1C in the last 72 hours. ------------------------------------------------------------------------------------------------------------------ No results for input(s): CHOL, HDL, LDLCALC, TRIG, CHOLHDL, LDLDIRECT in the last 72  hours. ------------------------------------------------------------------------------------------------------------------ No results for input(s): TSH, T4TOTAL, T3FREE, THYROIDAB in the last 72 hours.  Invalid input(s): FREET3 ------------------------------------------------------------------------------------------------------------------ No results for input(s): VITAMINB12, FOLATE, FERRITIN, TIBC, IRON, RETICCTPCT in the last 72 hours.  Coagulation profile  Recent Labs Lab 12/21/13 0528  INR 1.13    No results for input(s): DDIMER in the last 72 hours.  Cardiac Enzymes No results for input(s): CKMB, TROPONINI, MYOGLOBIN in the last 168 hours.  Invalid input(s): CK ------------------------------------------------------------------------------------------------------------------ Invalid input(s): POCBNP     Time Spent in minutes  25 minutes   Tomika Eckles M.D on 12/24/2013 at 6:35 PM  Between 7am to 7pm - Pager - [phone]  After 7pm go to [email or web address] - password [email or web address]  And look for the night coverage person covering for me after hours  [facility] Office  [phone]   **Disclaimer: This note may have been dictated with voice recognition software. Similar sounding words can inadvertently be transcribed and this note may contain transcription errors which may not have been corrected upon publication of note.**

## 2013-12-25 ENCOUNTER — Inpatient Hospital Stay (HOSPITAL_COMMUNITY): Payer: Medicare Other

## 2013-12-25 DIAGNOSIS — A319 Mycobacterial infection, unspecified: Secondary | ICD-10-CM

## 2013-12-25 LAB — POTASSIUM: Potassium: 3.6 mEq/L — ABNORMAL LOW (ref 3.7–5.3)

## 2013-12-25 LAB — BASIC METABOLIC PANEL
Anion gap: 12 (ref 5–15)
BUN: 12 mg/dL (ref 6–23)
CO2: 34 meq/L — AB (ref 19–32)
CREATININE: 0.92 mg/dL (ref 0.50–1.10)
Calcium: 9 mg/dL (ref 8.4–10.5)
Chloride: 91 mEq/L — ABNORMAL LOW (ref 96–112)
GFR calc Af Amer: 77 mL/min — ABNORMAL LOW (ref >90)
GFR calc non Af Amer: 67 mL/min — ABNORMAL LOW (ref >90)
Glucose, Bld: 137 mg/dL — ABNORMAL HIGH (ref 70–99)
Potassium: 2.6 mEq/L — CL (ref 3.7–5.3)
Sodium: 137 mEq/L (ref 137–147)

## 2013-12-25 LAB — CBC
HEMATOCRIT: 32.8 % — AB (ref 36.0–46.0)
Hemoglobin: 10 g/dL — ABNORMAL LOW (ref 12.0–15.0)
MCH: 28.2 pg (ref 26.0–34.0)
MCHC: 30.5 g/dL (ref 30.0–36.0)
MCV: 92.4 fL (ref 78.0–100.0)
Platelets: 227 10*3/uL (ref 150–400)
RBC: 3.55 MIL/uL — ABNORMAL LOW (ref 3.87–5.11)
RDW: 14.2 % (ref 11.5–15.5)
WBC: 6.6 10*3/uL (ref 4.0–10.5)

## 2013-12-25 LAB — MAGNESIUM: Magnesium: 1.9 mg/dL (ref 1.5–2.5)

## 2013-12-25 MED ORDER — POTASSIUM CHLORIDE 10 MEQ/100ML IV SOLN
10.0000 meq | INTRAVENOUS | Status: AC
  Administered 2013-12-25 (×3): 10 meq via INTRAVENOUS
  Filled 2013-12-25 (×2): qty 100

## 2013-12-25 MED ORDER — HYDROMORPHONE HCL 1 MG/ML IJ SOLN
2.0000 mg | INTRAMUSCULAR | Status: DC | PRN
  Administered 2013-12-25: 2 mg via INTRAVENOUS
  Filled 2013-12-25 (×2): qty 2

## 2013-12-25 MED ORDER — HYDROMORPHONE HCL 1 MG/ML IJ SOLN
1.0000 mg | Freq: Once | INTRAMUSCULAR | Status: AC
  Administered 2013-12-25: 1 mg via INTRAVENOUS

## 2013-12-25 MED ORDER — POTASSIUM CHLORIDE CRYS ER 20 MEQ PO TBCR
40.0000 meq | EXTENDED_RELEASE_TABLET | ORAL | Status: AC
  Administered 2013-12-25 (×2): 40 meq via ORAL
  Filled 2013-12-25 (×2): qty 2

## 2013-12-25 MED ORDER — MAGNESIUM SULFATE 2 GM/50ML IV SOLN
2.0000 g | Freq: Once | INTRAVENOUS | Status: AC
  Administered 2013-12-25: 2 g via INTRAVENOUS
  Filled 2013-12-25: qty 50

## 2013-12-25 MED ORDER — HYDROMORPHONE HCL 1 MG/ML IJ SOLN
3.0000 mg | INTRAMUSCULAR | Status: DC | PRN
  Administered 2013-12-25 – 2013-12-26 (×5): 3 mg via INTRAVENOUS
  Filled 2013-12-25 (×6): qty 3

## 2013-12-25 MED ORDER — HYDROCODONE-ACETAMINOPHEN 10-325 MG PO TABS
1.0000 | ORAL_TABLET | ORAL | Status: DC | PRN
  Administered 2013-12-25 – 2013-12-29 (×14): 1 via ORAL
  Filled 2013-12-25 (×15): qty 1

## 2013-12-25 MED ORDER — MAGNESIUM SULFATE 50 % IJ SOLN
2.0000 g | Freq: Once | INTRAMUSCULAR | Status: DC
  Filled 2013-12-25: qty 4

## 2013-12-25 NOTE — Progress Notes (Signed)
Estill Bamberg of lab called for critical lab, K=2.6.  Paged Dr. Tana Coast for the critical lab.  MD called around [phone] and issued new orders.  Endorsed new order to incoming day shift RN.  Patient still asleep and no distress noted as of this time. VS stable.

## 2013-12-25 NOTE — Progress Notes (Signed)
Patient ID: [name] Carr, female   DOB: Jun 02, 1955, 58 y.o.   MRN: [id number] 3 Days Post-Op  Subjective: Pt crying c/o more pain.    Objective: Vital signs in last 24 hours: Temp:  [98.1 F (36.7 C)-98.7 F (37.1 C)] 98.1 F (36.7 C) (11/18 0553) Pulse Rate:  [71-80] 71 (11/18 0553) Resp:  [16-18] 18 (11/18 0553) BP: (118-126)/(55-57) 118/56 mmHg (11/18 0553) SpO2:  [96 %-99 %] 99 % (11/18 0553) Weight:  [198 lb 6.4 oz (89.994 kg)] 198 lb 6.4 oz (89.994 kg) (11/18 0553) Last BM Date: 12/24/13  Intake/Output from previous day: 11/17 0701 - 11/18 0700 In: 1620 [P.O.:1320; IV Piggyback:300] Out: -  Intake/Output this shift:    PE: Skin: left thigh looks much worse today.  She has cellulitis that has progressed up her thigh and out laterally.  She has darker purple areas more medially that look more like ecchymosis rather than necrosis.  Remains very indurated  Lab Results:   Recent Labs  12/24/13 0510 12/25/13 0525  WBC 7.8 6.6  HGB 10.6* 10.0*  HCT 33.8* 32.8*  PLT 223 227   BMET  Recent Labs  12/24/13 0510 12/25/13 0525  NA 139 137  K 3.5* 2.6*  CL 93* 91*  CO2 33* 34*  GLUCOSE 108* 137*  BUN 14 12  CREATININE 0.95 0.92  CALCIUM 9.2 9.0   PT/INR No results for input(s): LABPROT, INR in the last 72 hours. CMP     Component Value Date/Time   NA 137 12/25/2013 0525   NA 135 04/25/2013 1439   NA 135* 04/08/2013 1148   K 2.6* 12/25/2013 0525   K 3.5 04/25/2013 1439   K 4.6 04/08/2013 1148   CL 91* 12/25/2013 0525   CL 90* 04/25/2013 1439   CL 91* 03/15/2012 1410   CO2 34* 12/25/2013 0525   CO2 32 04/25/2013 1439   CO2 28 04/08/2013 1148   GLUCOSE 137* 12/25/2013 0525   GLUCOSE 201* 04/25/2013 1439   GLUCOSE 268* 04/08/2013 1148   GLUCOSE 131* 03/15/2012 1410   BUN 12 12/25/2013 0525   BUN 26* 04/25/2013 1439   BUN 29.9* 04/08/2013 1148   CREATININE 0.92 12/25/2013 0525   CREATININE 0.94 09/11/2013 1718   CREATININE 1.2* 04/08/2013 1148   CALCIUM 9.0 12/25/2013 0525   CALCIUM 9.9 04/25/2013 1439   CALCIUM 9.6 04/08/2013 1148   PROT 6.4 12/21/2013 0528   PROT 8.5* 04/25/2013 1439   PROT 6.9 04/08/2013 1148   ALBUMIN 3.2* 12/21/2013 0528   ALBUMIN 3.4* 04/08/2013 1148   AST 15 12/21/2013 0528   AST 21 04/25/2013 1439   AST 17 04/08/2013 1148   ALT 12 12/21/2013 0528   ALT 36 04/25/2013 1439   ALT 18 04/08/2013 1148   ALKPHOS 88 12/21/2013 0528   ALKPHOS 102* 04/25/2013 1439   ALKPHOS 100 04/08/2013 1148   BILITOT 0.3 12/21/2013 0528   BILITOT 0.70 04/25/2013 1439   BILITOT 0.34 04/08/2013 1148   GFRNONAA 67* 12/25/2013 0525   GFRNONAA 67 09/11/2013 1718   GFRAA 77* 12/25/2013 0525   GFRAA 77 09/11/2013 1718   Lipase     Component Value Date/Time   LIPASE 25 08/09/2010 1749       Studies/Results: No results found.  Anti-infectives: Anti-infectives    Start     Dose/Rate Route Frequency Ordered Stop   12/22/13 1200  vancomycin (VANCOCIN) IVPB 1000 mg/200 mL premix     1,000 mg200 mL/hr over 60 Minutes Intravenous Every 12  hours 12/22/13 0818     12/21/13 1200  vancomycin (VANCOCIN) IVPB 750 mg/150 ml premix  Status:  Discontinued     750 mg150 mL/hr over 60 Minutes Intravenous Every 12 hours 12/21/13 0248 12/22/13 0817   12/21/13 0430  piperacillin-tazobactam (ZOSYN) IVPB 3.375 g     3.375 g12.5 mL/hr over 240 Minutes Intravenous 3 times per day 12/21/13 0415     12/20/13 2345  vancomycin (VANCOCIN) IVPB 1000 mg/200 mL premix     1,000 mg200 mL/hr over 60 Minutes Intravenous  Once 12/20/13 2344 12/21/13 0147       Assessment/Plan  1. POD 3, s/p I&D of left thigh abscess, now worsening  Plan: 1. Cultures reveal AFB/TB.  Unclear what this means as far as her contact precaution status and if she were to need further I&D what that means from cautery smoke.  ID is being consulted 2. Due to worsening of her leg, she is getting a repeat CT scan, but I have made her NPO due to a concern that she may need  to go back to the OR later today for further debridement. 3. D/W patient and Dr. Tana Coast.   LOS: 5 days    Britteny Fiebelkorn E 12/25/2013, 10:53 AM Pager: [phone]

## 2013-12-25 NOTE — Progress Notes (Signed)
Patient ID: [name] Carr  female  ZJI:[id number]    DOB: 11-15-1955    DOA: 12/20/2013  PCP: [name], MD  Brief history of present illness Patient is a 58 year old female with history of SLE, chronic pain syndrome, hypertension, diabetes, Sjogren's syndrome, Addison's disease, chronic peripheral edema presented with swelling of the left thigh, progressively worsening, redness and pain for 3 days.patient had a fever of 101 on the day of admission. Localized ultrasound showed left thigh abscess. Surgery was consulted and patient underwent I&D on 12/22/13. Wound culture growing acid-fast bacilli. infectious disease has been consulted.  Assessment/Plan: Principal Problem:   Abscess and cellulitis: left thigh - Complaining of significant redness and worsening of induration, IND of done on 11/15 - Discussed with general surgery, will obtain CT of the left thigh to rule out any residual or worsening abscess - Final cultures pending, Gram stain showing 1+ acid-fast bacilli, I discussed with Dr. Graylon Good, possibly MAC infection, does not need TB precautions.   Active Problems: Lupus-followed at [facility] - Continue prednisone  Diabetes mellitus - Blood sugars controlled  Acute on chronic pain due to abscess in the left thigh -increase the Dilaudid, increased Norco to q4hrs PRN moderate or breakthrough pain     Neuropathy - continue Neurontin   essential Hypertension - currently stable  DVT Prophylaxis:  Code Status:full code  Family Communication:  Disposition:  Consultants: Gen. Surgery ID  Procedures:  INDon 11/15  Antibiotics:  IV vancomycin  IV Zosyn  Subjective: Patient seen and examined, tearful with pain in the left thigh, more induration and redness today, afebrile  Objective: Weight change: 0.181 kg (6.4 oz)  Intake/Output Summary (Last 24 hours) at 12/25/13 1139 Last data filed at 12/25/13 0557  Gross per 24 hour  Intake   1140 ml  Output       0 ml  Net   1140 ml   Blood pressure 118/56, pulse 71, temperature 98.1 F (36.7 C), temperature source Oral, resp. rate 18, height 5\' 4"  (1.626 m), weight 89.994 kg (198 lb 6.4 oz), SpO2 99 %.  Physical Exam: General: Alert and awake, oriented x3, not in any acute distress. HEENT: anicteric sclera, PERLA, EOMI CVS: S1-S2 clear, no murmur rubs or gallops Chest: clear to auscultation bilaterally, no wheezing, rales or rhonchi Abdomen: soft nontender, nondistended, normal bowel sounds  Extremities: no cyanosis, clubbing or edema noted bilaterally, left thigh with significant cellulitis and induration, tenderness above the dressing Neuro: Cranial nerves II-XII intact, no focal neurological deficits  Lab Results: Basic Metabolic Panel:  Recent Labs Lab 12/24/13 0510 12/25/13 0525  NA 139 137  K 3.5* 2.6*  CL 93* 91*  CO2 33* 34*  GLUCOSE 108* 137*  BUN 14 12  CREATININE 0.95 0.92  CALCIUM 9.2 9.0  MG  --  1.9   Liver Function Tests:  Recent Labs Lab 12/21/13 0528  AST 15  ALT 12  ALKPHOS 88  BILITOT 0.3  PROT 6.4  ALBUMIN 3.2*   No results for input(s): LIPASE, AMYLASE in the last 168 hours. No results for input(s): AMMONIA in the last 168 hours. CBC:  Recent Labs Lab 12/20/13 2300  12/24/13 0510 12/25/13 0525  WBC 9.3  < > 7.8 6.6  NEUTROABS 7.1  --   --   --   HGB 10.4*  < > 10.6* 10.0*  HCT 32.4*  < > 33.8* 32.8*  MCV 93.1  < > 92.1 92.4  PLT 192  < > 223 227  < > =  values in this interval not displayed. Cardiac Enzymes: No results for input(s): CKTOTAL, CKMB, CKMBINDEX, TROPONINI in the last 168 hours. BNP: Invalid input(s): POCBNP CBG:  Recent Labs Lab 12/21/13 1957 12/22/13 1101  GLUCAP 87 85     Micro Results: Recent Results (from the past 240 hour(s))  Surgical pcr screen     Status: Abnormal   Collection Time: 12/21/13  5:15 PM  Result Value Ref Range Status   MRSA, PCR POSITIVE (A) NEGATIVE Final   Staphylococcus aureus POSITIVE  (A) NEGATIVE Final    Comment:        The Xpert SA Assay (FDA approved for NASAL specimens in patients over 37 years of age), is one component of a comprehensive surveillance program.  Test performance has been validated by [employer] for patients greater than or equal to 58 year old. It is not intended to diagnose infection nor to guide or monitor treatment.   Anaerobic culture     Status: None (Preliminary result)   Collection Time: 12/22/13 10:13 AM  Result Value Ref Range Status   Specimen Description PENDING  Incomplete   Special Requests PENDING  Incomplete   Gram Stain   Final    ABUNDANT WBC PRESENT,BOTH PMN AND MONONUCLEAR NO SQUAMOUS EPITHELIAL CELLS SEEN NO ORGANISMS SEEN Performed at [facility]    Culture   Final    NO ANAEROBES ISOLATED; CULTURE IN PROGRESS FOR 5 DAYS Performed at [facility]    Report Status PENDING  Incomplete  AFB culture with smear     Status: None (Preliminary result)   Collection Time: 12/22/13 10:13 AM  Result Value Ref Range Status   Specimen Description PENDING  Incomplete   Special Requests PENDING  Incomplete   Acid Fast Smear   Final    1+ ACID FAST BACILLI SEEN CRITICAL RESULT CALLED TO, READ BACK BY AND VERIFIED WITH: DANA RN @ 5465 ON 12/24/13 BY WILSN CRITICAL RESULT CALLED TO, READ BACK BY AND VERIFIED WITH: [place] [facility] @ 0354 ON 12/24/13 BY WILSN Performed at [facility]    Culture   Final    CULTURE WILL BE EXAMINED FOR 6 WEEKS BEFORE ISSUING A FINAL REPORT Performed at [facility]    Report Status PENDING  Incomplete  Culture, routine-abscess     Status: None (Preliminary result)   Collection Time: 12/22/13 10:13 AM  Result Value Ref Range Status   Specimen Description ABSCESS LEFT THIGH  Final   Special Requests NONE  Final   Gram Stain   Final    ABUNDANT WBC PRESENT,BOTH PMN AND MONONUCLEAR NO SQUAMOUS EPITHELIAL CELLS SEEN NO ORGANISMS SEEN Performed at [facility]    Culture   Final    NO GROWTH 1 DAY Performed at [facility]    Report Status PENDING  Incomplete    Studies/Results: [place] Extrem Bryant  12/21/2013   CLINICAL DATA:  58 year old female with upper left thigh swelling and redness. History of previous abscess in this area 6 months ago.  EXAM: ULTRASOUND LEFT LOWER EXTREMITY LIMITED  TECHNIQUE: Ultrasound examination of the lower extremity soft tissues was performed in the area of clinical concern.  COMPARISON:  08/23/2013 CT 07/05/2013 ultrasound.  FINDINGS: Two separate hypoechoic areas within the upper left thigh in the area of redness are identified without internal vascular flow and suspicious for abscess/ infected collection or hematoma.  The more superficial hypoechoic area measures 4.1 x 2.5 x 4.1 cm and  the deeper hypoechoic area measures 3.6 x 1.7 x 3.1 cm.  Overlying subcutaneous edema is noted.  IMPRESSION: Two separate hypoechoic areas within the upper left thigh underlying the area of redness suspicious for abscess/infected collection or possibly hematoma. The more superficial area measures 4.1 x 2.5 x 4.1 cm and the deeper area measures 3.6 x 1.7 x 3.1 cm.   Electronically Signed   By: Hassan Rowan M.D.   On: 12/21/2013 13:33    Medications: Scheduled Meds: . acidophilus  2 capsule Oral Daily  . carisoprodol  350 mg Oral TID  . docusate sodium  200 mg Oral BID  . DULoxetine  60 mg Oral BID  . furosemide  40 mg Oral Daily  . gabapentin  800 mg Oral TID  . heparin  5,000 Units Subcutaneous 3 times per day  . hydrochlorothiazide  25 mg Oral Daily  . magnesium sulfate 1 - 4 g bolus IVPB  2 g Intravenous Once  . nystatin   Topical BID  . pantoprazole  40 mg Oral Daily  . piperacillin-tazobactam (ZOSYN)  IV  3.375 g Intravenous 3 times per day  . potassium chloride SA  20 mEq Oral BID  . potassium chloride  40 mEq Oral Q4H  . predniSONE  7.5 mg Oral Q breakfast  . rOPINIRole  1 mg Oral QHS  . vancomycin   1,000 mg Intravenous Q12H      LOS: 5 days   RAI,RIPUDEEP M.D. [facility] 12/25/2013, 11:39 AM Pager: [phone]  If 7PM-7AM, please contact night-coverage [email or web address] Password [email or web address]

## 2013-12-25 NOTE — Consult Note (Signed)
[facility] for Infectious Disease  Total days of antibiotics 6        Day 6 vanco        Day 6 piptazo               Reason for Consult: left leg wound infection, mycobacterial/NTM presumably   Referring Physician: Tana Coast  Principal Problem:   Abscess and cellulitis Active Problems:   Malignant neoplasm of female breast   Type 2 diabetes mellitus   Chronic pain syndrome   Immunocompromised patient   SLE-on prednisone-Follow at [facility]   Neuropathy   Hypertension    HPI: [name] Carr is a 58 y.o. female with past med hx, of metastatic breast ca with liver mets, previous treated with cytoxan and txotere, out of care x 3 yrs.  SLE, chronic pain syndrome, hypertension, history of diabetes, Sjogren, Addison's disease, chronic leg swelling, possible diastolic dysfunction. In July, she sustained injury to left thigh while doing yard work. She had I x D started on vanco and cefazolin, admitted to [facility] for [facility] management. She was seen by ID consultation at that time, cultures had not been done. Patient discharged on ceftaroline, then switched to vancomycin and seen in follow up as o/p on 09/10/2013. At that time wound had improved, and did not get further antibiotics aside from 2 wk of IV antibiotics. She is readmitted on 11/14 for swollen, painful, inflamed left upper thigh. Admitted started on vancomycin and piptazo, went to OR on 11/15 for I x D of left thigh abscess. Micro culture identified +1 AFB. Patient had repeat thigh CT that may need further debridement.  She states that she thinks her dogs have scratched her which caused her wound to get worse. She has been unwell since the summer.  Past Medical History  Diagnosis Date  . PERSONALITY DISORDER   . Chronic pain syndrome     chronic narcotics, dependancy hx - [facility] for pain mgmt  . MIGRAINE HEADACHE   . Lyme disease   . ALLERGIC RHINITIS   . ANEMIA-IRON DEFICIENCY   . HYPERLIPIDEMIA   . GERD   . DIABETES MELLITUS, TYPE II     . DEPRESSION   . SLE (systemic lupus erythematosus)     [facility] - chronic pred  . Psoriasis   . Addison's disease   . Degenerative joint disease   . DDD (degenerative disc disease)   . Phlebitis after infusion     left ?calf; "S/P phenergan/demerol injection"  . Blood clot in vein 2011; 02/04/11    "right jugular vein;left jugular vein"  . SEIZURE DISORDER     "lots of seizures; due to lupus"  . Anxiety   . Neuropathy     hands, feet, due to diabetes & back problem  . Surgical wound infection 02/15/2011    R shoulder and mastectomy site - related to PICC  . CARCINOMA, BREAST 08/2009 dx    R breast,  mets to liver - resolution s/p chemo, s/p B mastect  . Metastases to the liver   . RA (rheumatoid arthritis)   . Hypothyroidism pt states she has never had hy pothyroidism  . Hypertension     Allergies:  Allergies  Allergen Reactions  . Ammonia Other (See Comments)    Ended up on ventilator from ammonia tabs  . Contrast Media [Iodinated Diagnostic Agents] Other (See Comments)    Doesn't breath well.  . Iohexol Other (See Comments)    SOB   .  Methotrexate Anaphylaxis  . Midazolam Hcl Anaphylaxis  . Nalbuphine Other (See Comments)    Can't breathe well  . Infliximab Nausea And Vomiting  . Ketorolac Tromethamine Other (See Comments)    Injectable doesn't work and pill hurts stomach.  . Morphine And Related Hives  . Nsaids Nausea And Vomiting  . Celecoxib Rash    MEDICATIONS: . acidophilus  2 capsule Oral Daily  . carisoprodol  350 mg Oral TID  . docusate sodium  200 mg Oral BID  . DULoxetine  60 mg Oral BID  . furosemide  40 mg Oral Daily  . gabapentin  800 mg Oral TID  . heparin  5,000 Units Subcutaneous 3 times per day  . hydrochlorothiazide  25 mg Oral Daily  . magnesium sulfate 1 - 4 g bolus IVPB  2 g Intravenous Once  . nystatin   Topical BID  . pantoprazole  40 mg Oral Daily  . piperacillin-tazobactam (ZOSYN)  IV  3.375 g Intravenous 3 times per  day  . potassium chloride  10 mEq Intravenous Q1 Hr x 3  . potassium chloride SA  20 mEq Oral BID  . potassium chloride  40 mEq Oral Q4H  . predniSONE  7.5 mg Oral Q breakfast  . rOPINIRole  1 mg Oral QHS  . vancomycin  1,000 mg Intravenous Q12H    History  Substance Use Topics  . Smoking status: Never Smoker   . Smokeless tobacco: Never Used     Comment: Never used tobacco  . Alcohol Use: No    Family History  Problem Relation Age of Onset  . Lung cancer Mother   . Lung cancer Father   . Arthritis Other   . Diabetes Other   . Hyperlipidemia Other      Review of Systems  Constitutional: Negative for fever, chills, diaphoresis, activity change, appetite change, fatigue and unexpected weight change.  HENT: Negative for congestion, sore throat, rhinorrhea, sneezing, trouble swallowing and sinus pressure.  Eyes: Negative for photophobia and visual disturbance.  Respiratory: Negative for cough, chest tightness, shortness of breath, wheezing and stridor.  Cardiovascular: Negative for chest pain, palpitations and leg swelling.  Gastrointestinal: Negative for nausea, vomiting, abdominal pain, diarrhea, constipation, blood in stool, abdominal distention and anal bleeding.  Genitourinary: Negative for dysuria, hematuria, flank pain and difficulty urinating.  Musculoskeletal: Negative for myalgias, back pain, joint swelling, arthralgias and gait problem.  Skin: left leg wound per hpi Neurological: Negative for dizziness, tremors, weakness and light-headedness.  Hematological: Negative for adenopathy. Does not bruise/bleed easily.  Psychiatric/Behavioral: Negative for behavioral problems, confusion, sleep disturbance, dysphoric mood, decreased concentration and agitation.     OBJECTIVE: Temp:  [98.1 F (36.7 C)-98.7 F (37.1 C)] 98.1 F (36.7 C) (11/18 0553) Pulse Rate:  [71-80] 71 (11/18 0553) Resp:  [16-18] 18 (11/18 0553) BP: (118-126)/(55-57) 118/56 mmHg (11/18 0553) SpO2:   [96 %-99 %] 99 % (11/18 0553) Weight:  [198 lb 6.4 oz (89.994 kg)] 198 lb 6.4 oz (89.994 kg) (11/18 0553) Physical Exam  Constitutional:  oriented to person, place, and time. appears well-developed and well-nourished. No distress.  HENT: edentulous  Mouth/Throat: Oropharynx is clear and moist. No oropharyngeal exudate.  Cardiovascular: Normal rate, regular rhythm and normal heart sounds. Exam reveals no gallop and no friction rub.  No murmur heard.  Chest wall = bilateral mastectomy Pulmonary/Chest: Effort normal and breath sounds normal. No respiratory distress.  has no wheezes.  Abdominal: Soft. Bowel sounds are normal.  exhibits no distension. There  is no tenderness.  Lymphadenopathy: no cervical adenopathy.  Neurological: alert and oriented to person, place, and time.  Skin: left thigh bandaged from i x d. Superior to incision indurated and echymotic area of 3 x 6 cm in length Psychiatric: a normal mood and affect.  behavior is normal.    LABS: Results for orders placed or performed during the hospital encounter of 12/20/13 (from the past 48 hour(s))  CBC     Status: Abnormal   Collection Time: 12/24/13  5:10 AM  Result Value Ref Range   WBC 7.8 4.0 - 10.5 K/uL   RBC 3.67 (L) 3.87 - 5.11 MIL/uL   Hemoglobin 10.6 (L) 12.0 - 15.0 g/dL   HCT 33.8 (L) 36.0 - 46.0 %   MCV 92.1 78.0 - 100.0 fL   MCH 28.9 26.0 - 34.0 pg   MCHC 31.4 30.0 - 36.0 g/dL   RDW 14.2 11.5 - 15.5 %   Platelets 223 150 - 400 K/uL  Basic metabolic panel     Status: Abnormal   Collection Time: 12/24/13  5:10 AM  Result Value Ref Range   Sodium 139 137 - 147 mEq/L   Potassium 3.5 (L) 3.7 - 5.3 mEq/L   Chloride 93 (L) 96 - 112 mEq/L   CO2 33 (H) 19 - 32 mEq/L   Glucose, Bld 108 (H) 70 - 99 mg/dL   BUN 14 6 - 23 mg/dL   Creatinine, Ser 0.95 0.50 - 1.10 mg/dL   Calcium 9.2 8.4 - 10.5 mg/dL   GFR calc non Af Amer 64 (L) >90 mL/min   GFR calc Af Amer 75 (L) >90 mL/min    Comment: (NOTE) The eGFR has been  calculated using the CKD EPI equation. This calculation has not been validated in all clinical situations. eGFR's persistently <90 mL/min signify possible Chronic Kidney Disease.    Anion gap 13 5 - 15  CBC     Status: Abnormal   Collection Time: 12/25/13  5:25 AM  Result Value Ref Range   WBC 6.6 4.0 - 10.5 K/uL   RBC 3.55 (L) 3.87 - 5.11 MIL/uL   Hemoglobin 10.0 (L) 12.0 - 15.0 g/dL   HCT 32.8 (L) 36.0 - 46.0 %   MCV 92.4 78.0 - 100.0 fL   MCH 28.2 26.0 - 34.0 pg   MCHC 30.5 30.0 - 36.0 g/dL   RDW 14.2 11.5 - 15.5 %   Platelets 227 150 - 400 K/uL  Basic metabolic panel     Status: Abnormal   Collection Time: 12/25/13  5:25 AM  Result Value Ref Range   Sodium 137 137 - 147 mEq/L   Potassium 2.6 (LL) 3.7 - 5.3 mEq/L    Comment: CRITICAL RESULT CALLED TO, READ BACK BY AND VERIFIED WITHMayford Knife RN @ [phone] 12/25/13 LEONARD,A    Chloride 91 (L) 96 - 112 mEq/L   CO2 34 (H) 19 - 32 mEq/L   Glucose, Bld 137 (H) 70 - 99 mg/dL   BUN 12 6 - 23 mg/dL   Creatinine, Ser 0.92 0.50 - 1.10 mg/dL   Calcium 9.0 8.4 - 10.5 mg/dL   GFR calc non Af Amer 67 (L) >90 mL/min   GFR calc Af Amer 77 (L) >90 mL/min    Comment: (NOTE) The eGFR has been calculated using the CKD EPI equation. This calculation has not been validated in all clinical situations. eGFR's persistently <90 mL/min signify possible Chronic Kidney Disease.    Anion gap 12 5 -  15  Magnesium     Status: None   Collection Time: 12/25/13  5:25 AM  Result Value Ref Range   Magnesium 1.9 1.5 - 2.5 mg/dL    MICRO: 11/17 wound cx: + 1 afb IMAGING:  FINDINGS: There is a 3.4 cm open wound anterolaterally in the mid left thigh at the site of the patient's recent procedure. This is superficial to multiple coarse calcifications within the subcutaneous fat which are chronic and unchanged. There is soft tissue stranding throughout the surrounding subcutaneous fat without focal fluid collection. No definite underlying inflammatory  change or fluid collection is seen within the quadriceps musculature.  Chronic calcifications are present within the subcutaneous fat of both buttocks.  Sacroiliac degenerative changes are present bilaterally. The left hip appears unremarkable. There is irregular subchondral sclerosis and cyst formation in both femoral condyles at the knee suspicious for chronic osteonecrosis. There is no evidence of acute fracture or bone destruction.  IMPRESSION: 1. Recent irrigation and debridement in the anterolateral left thigh with open wound. There are surrounding inflammatory changes in the subcutaneous fat, but no residual fluid collection identified. 2. Chronic soft tissue calcifications attributed to the patient's connective tissue disorder. 3. No evidence of osteomyelitis. 4. 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   Assessment/Plan:  58yo F with chronic left thigh wound after sustaining yardwork injury in July/aug, improved then recently worsened. cx show +1AFB from I x D. Chronicity of wound and hx of puncture from thorn from gardening can be consistent with m.chelonae, m.abscessus or [place]ii.   - recommend to d/c vanco and piptazo since this is more likely due to NTM which would need to be identified before determining treatment regimen - will need to wait 2-3 wk for identification before can devise empiric regimen - continue with local wound care  Ambree Frances B. [facility] [phone]

## 2013-12-26 LAB — BASIC METABOLIC PANEL
Anion gap: 15 (ref 5–15)
BUN: 10 mg/dL (ref 6–23)
CHLORIDE: 93 meq/L — AB (ref 96–112)
CO2: 33 meq/L — AB (ref 19–32)
Calcium: 9.1 mg/dL (ref 8.4–10.5)
Creatinine, Ser: 0.76 mg/dL (ref 0.50–1.10)
GFR calc Af Amer: >90 mL/min (ref >90)
GFR calc non Af Amer: >90 mL/min (ref >90)
GLUCOSE: 107 mg/dL — AB (ref 70–99)
POTASSIUM: 2.9 meq/L — AB (ref 3.7–5.3)
SODIUM: 141 meq/L (ref 137–147)

## 2013-12-26 LAB — CBC
HEMATOCRIT: 34 % — AB (ref 36.0–46.0)
Hemoglobin: 10.6 g/dL — ABNORMAL LOW (ref 12.0–15.0)
MCH: 28.8 pg (ref 26.0–34.0)
MCHC: 31.2 g/dL (ref 30.0–36.0)
MCV: 92.4 fL (ref 78.0–100.0)
Platelets: 234 10*3/uL (ref 150–400)
RBC: 3.68 MIL/uL — AB (ref 3.87–5.11)
RDW: 14.3 % (ref 11.5–15.5)
WBC: 6.9 10*3/uL (ref 4.0–10.5)

## 2013-12-26 LAB — CULTURE, ROUTINE-ABSCESS

## 2013-12-26 MED ORDER — POTASSIUM CHLORIDE CRYS ER 20 MEQ PO TBCR
40.0000 meq | EXTENDED_RELEASE_TABLET | Freq: Two times a day (BID) | ORAL | Status: DC
  Administered 2013-12-26 – 2013-12-29 (×6): 40 meq via ORAL
  Filled 2013-12-26 (×9): qty 2

## 2013-12-26 MED ORDER — POTASSIUM CHLORIDE CRYS ER 20 MEQ PO TBCR
40.0000 meq | EXTENDED_RELEASE_TABLET | ORAL | Status: AC
  Administered 2013-12-26 (×2): 40 meq via ORAL

## 2013-12-26 MED ORDER — HYDROMORPHONE HCL 1 MG/ML IJ SOLN
2.0000 mg | INTRAMUSCULAR | Status: DC | PRN
  Administered 2013-12-26: 2 mg via INTRAVENOUS
  Administered 2013-12-26: 3 mg via INTRAVENOUS
  Administered 2013-12-26 – 2013-12-27 (×5): 2 mg via INTRAVENOUS
  Administered 2013-12-27: 3 mg via INTRAVENOUS
  Administered 2013-12-27 – 2013-12-28 (×2): 2 mg via INTRAVENOUS
  Filled 2013-12-26: qty 3
  Filled 2013-12-26 (×2): qty 2
  Filled 2013-12-26: qty 3
  Filled 2013-12-26 (×2): qty 2
  Filled 2013-12-26: qty 3
  Filled 2013-12-26 (×4): qty 2

## 2013-12-26 MED ORDER — POTASSIUM CHLORIDE 10 MEQ/100ML IV SOLN
10.0000 meq | INTRAVENOUS | Status: AC
  Administered 2013-12-26 (×3): 10 meq via INTRAVENOUS
  Filled 2013-12-26 (×3): qty 100

## 2013-12-26 NOTE — Progress Notes (Signed)
CRITICAL VALUE ALERT  Critical value received:  Potassium 2.9  Date of notification:  12/26/2013  Time of notification:  0610  Critical value read back:Yes.    Nurse who received alert:  Maggie Font   MD notified (1st page):  Raliegh Ip Schorr  Time of first page:  0612  MD notified (2nd page):  Time of second page:  Responding MD:  Lamar Blinks  Time MD responded:  [id number]  Comments:   Potassium 3.7 - 5.3 mEq/L 2.9 (LL) 3.6 (L)CM 2.6 (LL)CM 3.5 (L)   Comments: CRITICAL RESULT CALLED TO, READ BACK BY AND VERIFIED WITH:  R.HILL RN [phone] 12/26/13 E.GADDY     New orders were placed. Will implement. Will continue to assess and monitor the patient.   [employer], BSN, RN-BC, [place] [place]

## 2013-12-26 NOTE — Progress Notes (Signed)
Patient ID: [name] Carr, female   DOB: Feb 01, 1956, 58 y.o.   MRN: [id number] 4 Days Post-Op  Subjective: Pt sleeping today.  Objective: Vital signs in last 24 hours: Temp:  [98 F (36.7 C)-98.9 F (37.2 C)] 98.1 F (36.7 C) (11/19 0612) Pulse Rate:  [73-94] 73 (11/19 0612) Resp:  [16-19] 17 (11/19 0612) BP: (115-127)/(54-72) 119/58 mmHg (11/19 0612) SpO2:  [93 %-98 %] 93 % (11/19 0612) Last BM Date: 12/25/13  Intake/Output from previous day: 11/18 0701 - 11/19 0700 In: 610 [P.O.:600; I.V.:10] Out: 3 [Urine:3] Intake/Output this shift:    PE: Skin: wound is still clean, redness above wound is improved, but she does have some faint cellulitis and warmth down the medial part of her thigh.    Lab Results:   Recent Labs  12/25/13 0525 12/26/13 0430  WBC 6.6 6.9  HGB 10.0* 10.6*  HCT 32.8* 34.0*  PLT 227 234   BMET  Recent Labs  12/25/13 0525 12/25/13 1617 12/26/13 0430  NA 137  --  141  K 2.6* 3.6* 2.9*  CL 91*  --  93*  CO2 34*  --  33*  GLUCOSE 137*  --  107*  BUN 12  --  10  CREATININE 0.92  --  0.76  CALCIUM 9.0  --  9.1   PT/INR No results for input(s): LABPROT, INR in the last 72 hours. CMP     Component Value Date/Time   NA 141 12/26/2013 0430   NA 135 04/25/2013 1439   NA 135* 04/08/2013 1148   K 2.9* 12/26/2013 0430   K 3.5 04/25/2013 1439   K 4.6 04/08/2013 1148   CL 93* 12/26/2013 0430   CL 90* 04/25/2013 1439   CL 91* 03/15/2012 1410   CO2 33* 12/26/2013 0430   CO2 32 04/25/2013 1439   CO2 28 04/08/2013 1148   GLUCOSE 107* 12/26/2013 0430   GLUCOSE 201* 04/25/2013 1439   GLUCOSE 268* 04/08/2013 1148   GLUCOSE 131* 03/15/2012 1410   BUN 10 12/26/2013 0430   BUN 26* 04/25/2013 1439   BUN 29.9* 04/08/2013 1148   CREATININE 0.76 12/26/2013 0430   CREATININE 0.94 09/11/2013 1718   CREATININE 1.2* 04/08/2013 1148   CALCIUM 9.1 12/26/2013 0430   CALCIUM 9.9 04/25/2013 1439   CALCIUM 9.6 04/08/2013 1148   PROT 6.4 12/21/2013 0528    PROT 8.5* 04/25/2013 1439   PROT 6.9 04/08/2013 1148   ALBUMIN 3.2* 12/21/2013 0528   ALBUMIN 3.4* 04/08/2013 1148   AST 15 12/21/2013 0528   AST 21 04/25/2013 1439   AST 17 04/08/2013 1148   ALT 12 12/21/2013 0528   ALT 36 04/25/2013 1439   ALT 18 04/08/2013 1148   ALKPHOS 88 12/21/2013 0528   ALKPHOS 102* 04/25/2013 1439   ALKPHOS 100 04/08/2013 1148   BILITOT 0.3 12/21/2013 0528   BILITOT 0.70 04/25/2013 1439   BILITOT 0.34 04/08/2013 1148   GFRNONAA >90 12/26/2013 0430   GFRNONAA 67 09/11/2013 1718   GFRAA >90 12/26/2013 0430   GFRAA 77 09/11/2013 1718   Lipase     Component Value Date/Time   LIPASE 25 08/09/2010 1749       Studies/Results: Ct Femur Left Wo Contrast  12/25/2013   CLINICAL DATA:  Left upper thigh cellulitis. Previously treated with antibiotics and irrigation and drainage 4 months ago. Additional irrigation and drainage 12/22/2013. History of multiple medical problems including systemic lupus erythematosus, chronic pain syndrome, hypertension, diabetes and Sjogren syndrome. Subsequent encounter.  EXAM: CT OF THE LEFT FEMUR WITHOUT CONTRAST  TECHNIQUE: Multidetector CT imaging was performed according to the standard protocol. Multiplanar CT image reconstructions were also generated.  COMPARISON:  Ultrasound 12/21/2013.  CT 08/23/2013.  FINDINGS: There is a 3.4 cm open wound anterolaterally in the mid left thigh at the site of the patient's recent procedure. This is superficial to multiple coarse calcifications within the subcutaneous fat which are chronic and unchanged. There is soft tissue stranding throughout the surrounding subcutaneous fat without focal fluid collection. No definite underlying inflammatory change or fluid collection is seen within the quadriceps musculature.  Chronic calcifications are present within the subcutaneous fat of both buttocks.  Sacroiliac degenerative changes are present bilaterally. The left hip appears unremarkable. There is  irregular subchondral sclerosis and cyst formation in both femoral condyles at the knee suspicious for chronic osteonecrosis. There is no evidence of acute fracture or bone destruction.  IMPRESSION: 1. Recent irrigation and debridement in the anterolateral left thigh with open wound. There are surrounding inflammatory changes in the subcutaneous fat, but no residual fluid collection identified. 2. Chronic soft tissue calcifications attributed to the patient's connective tissue disorder. 3. No evidence of osteomyelitis. 4. 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   Electronically Signed   By: Camie Patience M.D.   On: 12/25/2013 12:10    Anti-infectives: Anti-infectives    Start     Dose/Rate Route Frequency Ordered Stop   12/22/13 1200  vancomycin (VANCOCIN) IVPB 1000 mg/200 mL premix     1,000 mg200 mL/hr over 60 Minutes Intravenous Every 12 hours 12/22/13 0818     12/21/13 1200  vancomycin (VANCOCIN) IVPB 750 mg/150 ml premix  Status:  Discontinued     750 mg150 mL/hr over 60 Minutes Intravenous Every 12 hours 12/21/13 0248 12/22/13 0817   12/21/13 0430  piperacillin-tazobactam (ZOSYN) IVPB 3.375 g  Status:  Discontinued     3.375 g12.5 mL/hr over 240 Minutes Intravenous 3 times per day 12/21/13 0415 12/26/13 0809   12/20/13 2345  vancomycin (VANCOCIN) IVPB 1000 mg/200 mL premix     1,000 mg200 mL/hr over 60 Minutes Intravenous  Once 12/20/13 2344 12/21/13 0147       Assessment/Plan  1. POD 4, s/p I&D of left thigh abscess, cx AFB  Plan: 1. Cont local wound care.  CT does not show any further progression or abscess that requires further surgical evaluation.   2. abx therapy per ID   LOS: 6 days    Kentrell Hallahan E 12/26/2013, 10:17 AM Pager: [phone]

## 2013-12-26 NOTE — Progress Notes (Signed)
Patient ID: [name] Carr  female  UKG:[id number]    DOB: 1955-10-19    DOA: 12/20/2013  PCP: [name], MD  Brief history of present illness Patient is a 58 year old female with history of SLE, chronic pain syndrome, hypertension, diabetes, Sjogren's syndrome, Addison's disease, chronic peripheral edema presented with swelling of the left thigh, progressively worsening, redness and pain for 3 days.patient had a fever of 101 on the day of admission. Localized ultrasound showed left thigh abscess. Surgery was consulted and patient underwent I&D on 12/22/13. Wound culture growing acid-fast bacilli. infectious disease has been consulted.  Assessment/Plan: Principal Problem:   Abscess and cellulitis: left thigh - s/p I&D on 11/15 - Repeat CT on 11/18 did not show any further progression or residual abscess, does not need further surgical intervention per surgery  - Cultures showing no staph aureus, Gram stain showing 1+ acid-fast bacilli, ID following, discontinued vancomycin and Zosyn, recommended to wait 2-3 weeks for identification before empiric regimen, continue local wound care, dressing changes - Per Dr. Graylon Good, does not need TB precautions.   Active Problems:  Hypokalemia - Replaced IV and oral potassium supplement  [facility] - Continue prednisone  Diabetes mellitus - Blood sugars controlled  Acute on chronic pain due to abscess in the left thigh -Continue Dilaudid and Norco, no further increase in pain meds     Neuropathy - continue Neurontin   essential Hypertension - currently stable  DVT Prophylaxis:  Code Status:full code  Family Communication:  Disposition:  Consultants: Gen. Surgery ID  Procedures:  INDon 11/15  Antibiotics:  IV vancomycin DC'd 11/18  IV Zosyn DC'd 11/18  Subjective: Patient seen and examined, somewhat somnolent today, easily arousable. When I walked into the room, patient was comfortably lying with  her eyes closed, subsequently started complaining about 'pain and needing more pain medications' on waking up  Objective: Weight change:   Intake/Output Summary (Last 24 hours) at 12/26/13 1130 Last data filed at 12/25/13 1620  Gross per 24 hour  Intake    370 ml  Output      1 ml  Net    369 ml   Blood pressure 119/58, pulse 73, temperature 98.1 F (36.7 C), temperature source Oral, resp. rate 17, height 5\' 4"  (1.626 m), weight 89.994 kg (198 lb 6.4 oz), SpO2 93 %.  Physical Exam: General: Alert and awake, oriented x3, not in any acute distress. CVS: S1-S2 clear, no murmur rubs or gallops Chest: CTA B Abdomen: soft nontender, nondistended, normal bowel sounds  Extremities: no c/c/e bilaterally, left thigh with cellulitis and induration, tenderness above the dressing slightly appears improved today Neuro: Cranial nerves II-XII intact, no focal neurological deficits  Lab Results: Basic Metabolic Panel:  Recent Labs Lab 12/25/13 0525 12/25/13 1617 12/26/13 0430  NA 137  --  141  K 2.6* 3.6* 2.9*  CL 91*  --  93*  CO2 34*  --  33*  GLUCOSE 137*  --  107*  BUN 12  --  10  CREATININE 0.92  --  0.76  CALCIUM 9.0  --  9.1  MG 1.9  --   --    Liver Function Tests:  Recent Labs Lab 12/21/13 0528  AST 15  ALT 12  ALKPHOS 88  BILITOT 0.3  PROT 6.4  ALBUMIN 3.2*   No results for input(s): LIPASE, AMYLASE in the last 168 hours. No results for input(s): AMMONIA in the last 168 hours. CBC:  Recent Labs Lab 12/20/13 2300  12/25/13 0525 12/26/13 0430  WBC 9.3  < > 6.6 6.9  NEUTROABS 7.1  --   --   --   HGB 10.4*  < > 10.0* 10.6*  HCT 32.4*  < > 32.8* 34.0*  MCV 93.1  < > 92.4 92.4  PLT 192  < > 227 234  < > = values in this interval not displayed. Cardiac Enzymes: No results for input(s): CKTOTAL, CKMB, CKMBINDEX, TROPONINI in the last 168 hours. BNP: Invalid input(s): POCBNP CBG:  Recent Labs Lab 12/21/13 1957 12/22/13 1101  GLUCAP 87 85     Micro  Results: Recent Results (from the past 240 hour(s))  Surgical pcr screen     Status: Abnormal   Collection Time: 12/21/13  5:15 PM  Result Value Ref Range Status   MRSA, PCR POSITIVE (A) NEGATIVE Final   Staphylococcus aureus POSITIVE (A) NEGATIVE Final    Comment:        The Xpert SA Assay (FDA approved for NASAL specimens in patients over 71 years of age), is one component of a comprehensive surveillance program.  Test performance has been validated by [employer] for patients greater than or equal to 23 year old. It is not intended to diagnose infection nor to guide or monitor treatment.   Anaerobic culture     Status: None (Preliminary result)   Collection Time: 12/22/13 10:13 AM  Result Value Ref Range Status   Specimen Description PENDING  Incomplete   Special Requests PENDING  Incomplete   Gram Stain   Final    ABUNDANT WBC PRESENT,BOTH PMN AND MONONUCLEAR NO SQUAMOUS EPITHELIAL CELLS SEEN NO ORGANISMS SEEN Performed at [facility]    Culture   Final    NO ANAEROBES ISOLATED; CULTURE IN PROGRESS FOR 5 DAYS Performed at [facility]    Report Status PENDING  Incomplete  AFB culture with smear     Status: None (Preliminary result)   Collection Time: 12/22/13 10:13 AM  Result Value Ref Range Status   Specimen Description PENDING  Incomplete   Special Requests PENDING  Incomplete   Acid Fast Smear   Final    1+ ACID FAST BACILLI SEEN CRITICAL RESULT CALLED TO, READ BACK BY AND VERIFIED WITH: DANA RN @ 4166 ON 12/24/13 BY WILSN CRITICAL RESULT CALLED TO, READ BACK BY AND VERIFIED WITH: [place] HD @ 0630 ON 12/24/13 BY WILSN Performed at [facility]    Culture   Final    CULTURE WILL BE EXAMINED FOR 6 WEEKS BEFORE ISSUING A FINAL REPORT Performed at [facility]    Report Status PENDING  Incomplete  Culture, routine-abscess     Status: None   Collection Time: 12/22/13 10:13 AM  Result Value Ref Range Status   Specimen  Description ABSCESS LEFT THIGH  Final   Special Requests NONE  Final   Gram Stain   Final    ABUNDANT WBC PRESENT,BOTH PMN AND MONONUCLEAR NO SQUAMOUS EPITHELIAL CELLS SEEN NO ORGANISMS SEEN Performed at [facility]    Culture   Final    MULTIPLE ORGANISMS PRESENT, NONE PREDOMINANT Note: NO STAPHYLOCOCCUS AUREUS ISOLATED NO GROUP A STREP (S.PYOGENES) ISOLATED Performed at [facility]    Report Status 12/26/2013 FINAL  Final    Studies/Results: Ct Femur Left Wo Contrast  12/25/2013   CLINICAL DATA:  Left upper thigh cellulitis. Previously treated with antibiotics and irrigation and drainage 4 months ago. Additional irrigation and drainage 12/22/2013. History of multiple medical  problems including systemic lupus erythematosus, chronic pain syndrome, hypertension, diabetes and Sjogren syndrome. Subsequent encounter.  EXAM: CT OF THE LEFT FEMUR WITHOUT CONTRAST  TECHNIQUE: Multidetector CT imaging was performed according to the standard protocol. Multiplanar CT image reconstructions were also generated.  COMPARISON:  Ultrasound 12/21/2013.  CT 08/23/2013.  FINDINGS: There is a 3.4 cm open wound anterolaterally in the mid left thigh at the site of the patient's recent procedure. This is superficial to multiple coarse calcifications within the subcutaneous fat which are chronic and unchanged. There is soft tissue stranding throughout the surrounding subcutaneous fat without focal fluid collection. No definite underlying inflammatory change or fluid collection is seen within the quadriceps musculature.  Chronic calcifications are present within the subcutaneous fat of both buttocks.  Sacroiliac degenerative changes are present bilaterally. The left hip appears unremarkable. There is irregular subchondral sclerosis and cyst formation in both femoral condyles at the knee suspicious for chronic osteonecrosis. There is no evidence of acute fracture or bone destruction.  IMPRESSION: 1.  Recent irrigation and debridement in the anterolateral left thigh with open wound. There are surrounding inflammatory changes in the subcutaneous fat, but no residual fluid collection identified. 2. Chronic soft tissue calcifications attributed to the patient's connective tissue disorder. 3. No evidence of osteomyelitis. 4. 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   Electronically Signed   By: Camie Patience M.D.   On: 12/25/2013 12:10   [place] Extrem Low Left Ltd  12/21/2013   CLINICAL DATA:  58 year old female with upper left thigh swelling and redness. History of previous abscess in this area 6 months ago.  EXAM: ULTRASOUND LEFT LOWER EXTREMITY LIMITED  TECHNIQUE: Ultrasound examination of the lower extremity soft tissues was performed in the area of clinical concern.  COMPARISON:  08/23/2013 CT 07/05/2013 ultrasound.  FINDINGS: Two separate hypoechoic areas within the upper left thigh in the area of redness are identified without internal vascular flow and suspicious for abscess/ infected collection or hematoma.  The more superficial hypoechoic area measures 4.1 x 2.5 x 4.1 cm and the deeper hypoechoic area measures 3.6 x 1.7 x 3.1 cm.  Overlying subcutaneous edema is noted.  IMPRESSION: Two separate hypoechoic areas within the upper left thigh underlying the area of redness suspicious for abscess/infected collection or possibly hematoma. The more superficial area measures 4.1 x 2.5 x 4.1 cm and the deeper area measures 3.6 x 1.7 x 3.1 cm.   Electronically Signed   By: Hassan Rowan M.D.   On: 12/21/2013 13:33    Medications: Scheduled Meds: . acidophilus  2 capsule Oral Daily  . carisoprodol  350 mg Oral TID  . docusate sodium  200 mg Oral BID  . DULoxetine  60 mg Oral BID  . furosemide  40 mg Oral Daily  . gabapentin  800 mg Oral TID  . heparin  5,000 Units Subcutaneous 3 times per day  . hydrochlorothiazide  25 mg Oral Daily  . nystatin   Topical BID  . pantoprazole  40 mg Oral Daily  . potassium  chloride SA  20 mEq Oral BID  . predniSONE  7.5 mg Oral Q breakfast  . rOPINIRole  1 mg Oral QHS      LOS: 6 days   RAI,RIPUDEEP M.D. [facility] 12/26/2013, 11:30 AM Pager: [phone]  If 7PM-7AM, please contact night-coverage [email or web address] Password [email or web address]

## 2013-12-27 LAB — BASIC METABOLIC PANEL
Anion gap: 13 (ref 5–15)
BUN: 12 mg/dL (ref 6–23)
CHLORIDE: 94 meq/L — AB (ref 96–112)
CO2: 33 meq/L — AB (ref 19–32)
Calcium: 9.3 mg/dL (ref 8.4–10.5)
Creatinine, Ser: 0.92 mg/dL (ref 0.50–1.10)
GFR calc Af Amer: 77 mL/min — ABNORMAL LOW (ref >90)
GFR, EST NON AFRICAN AMERICAN: 67 mL/min — AB (ref >90)
GLUCOSE: 118 mg/dL — AB (ref 70–99)
POTASSIUM: 3.2 meq/L — AB (ref 3.7–5.3)
SODIUM: 140 meq/L (ref 137–147)

## 2013-12-27 LAB — CBC
HCT: 33 % — ABNORMAL LOW (ref 36.0–46.0)
HEMOGLOBIN: 10.3 g/dL — AB (ref 12.0–15.0)
MCH: 28.7 pg (ref 26.0–34.0)
MCHC: 31.2 g/dL (ref 30.0–36.0)
MCV: 91.9 fL (ref 78.0–100.0)
Platelets: 251 10*3/uL (ref 150–400)
RBC: 3.59 MIL/uL — AB (ref 3.87–5.11)
RDW: 14.3 % (ref 11.5–15.5)
WBC: 7 10*3/uL (ref 4.0–10.5)

## 2013-12-27 MED ORDER — SULFAMETHOXAZOLE-TRIMETHOPRIM 400-80 MG PO TABS
1.0000 | ORAL_TABLET | Freq: Two times a day (BID) | ORAL | Status: DC
  Filled 2013-12-27 (×2): qty 1

## 2013-12-27 MED ORDER — NYSTATIN 100000 UNIT/GM EX POWD: CUTANEOUS | Status: DC

## 2013-12-27 MED ORDER — AMOXICILLIN-POT CLAVULANATE 875-125 MG PO TABS
1.0000 | ORAL_TABLET | Freq: Two times a day (BID) | ORAL | Status: DC
  Administered 2013-12-28: 1 via ORAL
  Filled 2013-12-27 (×7): qty 1

## 2013-12-27 MED ORDER — POTASSIUM CHLORIDE CRYS ER 20 MEQ PO TBCR: 40.0000 meq | EXTENDED_RELEASE_TABLET | Freq: Two times a day (BID) | ORAL | Status: DC

## 2013-12-27 MED ORDER — POTASSIUM CHLORIDE CRYS ER 20 MEQ PO TBCR
40.0000 meq | EXTENDED_RELEASE_TABLET | Freq: Once | ORAL | Status: AC
  Administered 2013-12-27: 40 meq via ORAL

## 2013-12-27 MED ORDER — PROMETHAZINE HCL 12.5 MG PO TABS: 12.5000 mg | ORAL_TABLET | Freq: Four times a day (QID) | ORAL | Status: DC | PRN

## 2013-12-27 MED ORDER — AMOXICILLIN-POT CLAVULANATE 875-125 MG PO TABS: 1.0000 | ORAL_TABLET | Freq: Two times a day (BID) | ORAL | Status: DC

## 2013-12-27 MED ORDER — HYDROCODONE-ACETAMINOPHEN 10-325 MG PO TABS: 1.0000 | ORAL_TABLET | ORAL | Status: DC | PRN

## 2013-12-27 NOTE — Progress Notes (Signed)
[name] Carr is currently refusing to take any antibiotics by mouth.  She is adamant that she cannot take them by mouth because they upset her stomach and she must have them by IV.  She is also adamant that she will be going home with her PICC line for IV antibiotics.  I have explained to [name] Carr the importance of taking her antibiotics and she said she would talk with the MD in the morning regarding this.  Will continue to monitor and encourage medication use. [name] Carr

## 2013-12-27 NOTE — Evaluation (Signed)
Physical Therapy Evaluation Patient Details Name: [name] Carr MRN: [id number] DOB: 05-Jan-1956 Today's Date: 12/27/2013   History of Present Illness  pt presents with L Thigh Abscess post I+D.  pt with hx of Thigh I+D, Lupus, Chronic Pain, and Breast CA with Liver Mets.    Clinical Impression  Pt focused on receiving IV pain meds all throughout session and upset that she had been given PO pain meds.  Pt inconsistent during conversation and even contradicts herself at times.  Pt at first tells PT that she uses her electric scooter for mobility at home, then stated she couldn't use it because it doesn't fit around her home.  When PT attempted to clarify scooter use, pt then stated that actually she can use it around her house if her friend wasn't so messy.  When pt sat in chair for rest break she stated that was as far as she could go without stopping to sit, but then was upset with PT for not making her ambulate down hallway.  Pt complained to PT stating that Nsg was making her stay in bed, but when PT had pt sit in recliner she stats she is unable to sit up and must remain in bed.  Feel pt mobility is safe enough to return to home, though anxiety and need for pain meds may effect balance at times and would recommend HHPT for home safety eval.  Will continue to follow while on acute.      Follow Up Recommendations Home health PT;Supervision - Intermittent    Equipment Recommendations  None recommended by PT    Recommendations for Other Services       Precautions / Restrictions Precautions Precautions: Fall Restrictions Weight Bearing Restrictions: No      Mobility  Bed Mobility Overal bed mobility: Modified Independent             General bed mobility comments: pt moves slowly, but no A needed.    Transfers Overall transfer level: Needs assistance Equipment used: Straight cane Transfers: Sit to/from Stand Sit to Stand: Supervision         General transfer comment: pt  relies on UEs for support with transfers, but no physical A needed.    Ambulation/Gait Ambulation/Gait assistance: Supervision Ambulation Distance (Feet): 15 Feet (x2) Assistive device: Straight cane Gait Pattern/deviations: Step-through pattern;Decreased stride length;Trunk flexed     General Gait Details: pt initially ambulated 5' with antalgic gait, but no difficulties, once within reach of bathroom door frame and wall pt had a "LOB" and caughter herself on the door frame.  pt then ambulated towards doorway and as PT was opening curtain pt had another "LOB" and caughter herself on the counter.  At that time pt pulled out chair from under sink and sat down for rest break.  After rest pt able to ambulate to recliner without LOB.    Stairs            Wheelchair Mobility    Modified Rankin (Stroke Patients Only)       Balance Overall balance assessment: Needs assistance Sitting-balance support: No upper extremity supported;Feet supported Sitting balance-Leahy Scale: Fair     Standing balance support: Single extremity supported;During functional activity;No upper extremity supported Standing balance-Leahy Scale: Fair Standing balance comment: pt able to stand without UE support, but during dynamic standing leans heavily on cane.  Pertinent Vitals/Pain Pain Assessment: 0-10 Pain Score: 10-Worst pain ever Pain Location: Back, L Thigh Pain Descriptors / Indicators: Constant;Aching Pain Intervention(s): Monitored during session;Premedicated before session;Repositioned    Home Living Family/patient expects to be discharged to:: Private residence Living Arrangements: Alone Available Help at Discharge: Friend(s);Available PRN/intermittently Type of Home: House Home Access: Stairs to enter Entrance Stairs-Rails: Right (pt states rail is broken.  ) Entrance Stairs-Number of Steps: 5 Home Layout: One level Home Equipment: Walker - 2  wheels;Walker - 4 wheels;Cane - single point;Bedside commode;Shower seat;Electric scooter      Prior Function Level of Independence: Independent with assistive device(s)         Comments: pt indicates using, cane, 4WW, electric scooter, or nothing for mobility.       Hand Dominance        Extremity/Trunk Assessment   Upper Extremity Assessment: Generalized weakness           Lower Extremity Assessment: Generalized weakness      Cervical / Trunk Assessment: Kyphotic  Communication   Communication: No difficulties  Cognition Arousal/Alertness: Awake/alert Behavior During Therapy: Anxious Overall Cognitive Status: Within Functional Limits for tasks assessed                      General Comments      Exercises        Assessment/Plan    PT Assessment Patient needs continued PT services  PT Diagnosis Difficulty walking;Generalized weakness;Acute pain   PT Problem List Decreased strength;Decreased activity tolerance;Decreased balance;Decreased mobility;Decreased coordination;Decreased knowledge of use of DME;Pain  PT Treatment Interventions DME instruction;Gait training;Stair training;Functional mobility training;Therapeutic activities;Therapeutic exercise;Balance training;Patient/family education   PT Goals (Current goals can be found in the Care Plan section) Acute Rehab PT Goals Patient Stated Goal: Not to hurt anymore.   PT Goal Formulation: With patient Time For Goal Achievement: 01/03/14 Potential to Achieve Goals: Good    Frequency Min 3X/week   Barriers to discharge Decreased caregiver support pt states she sometimes has to help her friend who is supposed to be helping her.      Co-evaluation               End of Session Equipment Utilized During Treatment: Gait belt Activity Tolerance: Patient limited by pain Patient left: in bed;with call bell/phone within reach Nurse Communication: Mobility status;Patient requests pain meds          Time: 0952-1016 PT Time Calculation (min) (ACUTE ONLY): 24 min   Charges:   PT Evaluation $Initial PT Evaluation Tier I: 1 Procedure PT Treatments $Gait Training: 8-22 mins   PT G CodesCatarina Hartshorn, [place] 12/27/2013, 10:36 AM

## 2013-12-27 NOTE — Discharge Instructions (Signed)
Dressing Change °A dressing is a material placed over wounds. It keeps the wound clean, dry, and protected from further injury. This provides an environment that favors wound healing.  °BEFORE YOU BEGIN °· Get your supplies together. Things you may need include: °¨ Saline solution. °¨ Flexible gauze dressing. °¨ Medicated cream. °¨ Tape. °¨ Gloves. °¨ Abdominal dressing pads. °¨ Gauze squares. °¨ Plastic bags. °· Take pain medicine 30 minutes before the dressing change if you need it. °· Take a shower before you do the first dressing change of the day. Use plastic wrap or a plastic bag to prevent the dressing from getting wet. °REMOVING YOUR OLD DRESSING  °· Wash your hands with soap and water. Dry your hands with a clean towel. °· Put on your gloves. °· Remove any tape. °· Carefully remove the old dressing. If the dressing sticks, you may dampen it with warm water to loosen it, or follow your caregiver's specific directions. °· Remove any gauze or packing tape that is in your wound. °· Take off your gloves. °· Put the gloves, tape, gauze, or any packing tape into a plastic bag. °CHANGING YOUR DRESSING °· Open the supplies. °· Take the cap off the saline solution. °· Open the gauze package so that the gauze remains on the inside of the package. °· Put on your gloves. °· Clean your wound as told by your caregiver. °· If you have been told to keep your wound dry, follow those instructions. °· Your caregiver may tell you to do one or more of the following: °¨ Pick up the gauze. Pour the saline solution over the gauze. Squeeze out the extra saline solution. °¨ Put medicated cream or other medicine on your wound if you have been told to do so. °¨ Put the solution soaked gauze only in your wound, not on the skin around it. °¨ Pack your wound loosely or as told by your caregiver. °¨ Put dry gauze on your wound. °¨ Put abdominal dressing pads over the dry gauze if your wet gauze soaks through. °· Tape the abdominal dressing  pads in place so they will not fall off. Do not wrap the tape completely around the affected part (arm, leg, abdomen). °· Wrap the dressing pads with a flexible gauze dressing to secure it in place. °· Take off your gloves. Put them in the plastic bag with the old dressing. Tie the bag shut and throw it away. °· Keep the dressing clean and dry until your next dressing change. °· Wash your hands. °SEEK MEDICAL CARE IF: °· Your skin around the wound looks red. °· Your wound feels more tender or sore. °· You see pus in the wound. °· Your wound smells bad. °· You have a fever. °· Your skin around the wound has a rash that itches and burns. °· You see black or yellow skin in your wound that was not there before. °· You feel nauseous, throw up, and feel very tired. °Document Released: 03/03/2004 Document Revised: 04/18/2011 Document Reviewed: 12/06/2010 °ExitCare® Patient Information ©2015 [employer]. This information is not intended to replace advice given to you by your health care provider. Make sure you discuss any questions you have with your health care provider. ° °

## 2013-12-27 NOTE — Progress Notes (Signed)
Patient ID: [name] Carr, female   DOB: 11/04/1955, 58 y.o.   MRN: [id number] 5 Days Post-Op  Subjective: Pt has multiple complaints.    Objective: Vital signs in last 24 hours: Temp:  [97.8 F (36.6 C)-99.2 F (37.3 C)] 97.8 F (36.6 C) (11/20 0500) Pulse Rate:  [70-90] 70 (11/20 0500) Resp:  [15-18] 18 (11/20 0500) BP: (105-124)/(49-65) 105/49 mmHg (11/20 0500) SpO2:  [93 %-97 %] 93 % (11/20 0500) Weight:  [195 lb 11.2 oz (88.769 kg)] 195 lb 11.2 oz (88.769 kg) (11/20 0500) Last BM Date: 12/25/13  Intake/Output from previous day: 11/19 0701 - 11/20 0700 In: 240 [P.O.:240] Out: -  Intake/Output this shift:    PE: Skin: left thigh wound is clean and packed.  No erythema present.  She does have a large ecchymosis of her upper left thigh, but no further evidence of infection.  Lab Results:   Recent Labs  12/26/13 0430 12/27/13 0450  WBC 6.9 7.0  HGB 10.6* 10.3*  HCT 34.0* 33.0*  PLT 234 251   BMET  Recent Labs  12/26/13 0430 12/27/13 0450  NA 141 140  K 2.9* 3.2*  CL 93* 94*  CO2 33* 33*  GLUCOSE 107* 118*  BUN 10 12  CREATININE 0.76 0.92  CALCIUM 9.1 9.3   PT/INR No results for input(s): LABPROT, INR in the last 72 hours. CMP     Component Value Date/Time   NA 140 12/27/2013 0450   NA 135 04/25/2013 1439   NA 135* 04/08/2013 1148   K 3.2* 12/27/2013 0450   K 3.5 04/25/2013 1439   K 4.6 04/08/2013 1148   CL 94* 12/27/2013 0450   CL 90* 04/25/2013 1439   CL 91* 03/15/2012 1410   CO2 33* 12/27/2013 0450   CO2 32 04/25/2013 1439   CO2 28 04/08/2013 1148   GLUCOSE 118* 12/27/2013 0450   GLUCOSE 201* 04/25/2013 1439   GLUCOSE 268* 04/08/2013 1148   GLUCOSE 131* 03/15/2012 1410   BUN 12 12/27/2013 0450   BUN 26* 04/25/2013 1439   BUN 29.9* 04/08/2013 1148   CREATININE 0.92 12/27/2013 0450   CREATININE 0.94 09/11/2013 1718   CREATININE 1.2* 04/08/2013 1148   CALCIUM 9.3 12/27/2013 0450   CALCIUM 9.9 04/25/2013 1439   CALCIUM 9.6 04/08/2013  1148   PROT 6.4 12/21/2013 0528   PROT 8.5* 04/25/2013 1439   PROT 6.9 04/08/2013 1148   ALBUMIN 3.2* 12/21/2013 0528   ALBUMIN 3.4* 04/08/2013 1148   AST 15 12/21/2013 0528   AST 21 04/25/2013 1439   AST 17 04/08/2013 1148   ALT 12 12/21/2013 0528   ALT 36 04/25/2013 1439   ALT 18 04/08/2013 1148   ALKPHOS 88 12/21/2013 0528   ALKPHOS 102* 04/25/2013 1439   ALKPHOS 100 04/08/2013 1148   BILITOT 0.3 12/21/2013 0528   BILITOT 0.70 04/25/2013 1439   BILITOT 0.34 04/08/2013 1148   GFRNONAA 67* 12/27/2013 0450   GFRNONAA 67 09/11/2013 1718   GFRAA 77* 12/27/2013 0450   GFRAA 77 09/11/2013 1718   Lipase     Component Value Date/Time   LIPASE 25 08/09/2010 1749       Studies/Results: Ct Femur Left Wo Contrast  12/25/2013   CLINICAL DATA:  Left upper thigh cellulitis. Previously treated with antibiotics and irrigation and drainage 4 months ago. Additional irrigation and drainage 12/22/2013. History of multiple medical problems including systemic lupus erythematosus, chronic pain syndrome, hypertension, diabetes and Sjogren syndrome. Subsequent encounter.  EXAM: CT OF THE  LEFT FEMUR WITHOUT CONTRAST  TECHNIQUE: Multidetector CT imaging was performed according to the standard protocol. Multiplanar CT image reconstructions were also generated.  COMPARISON:  Ultrasound 12/21/2013.  CT 08/23/2013.  FINDINGS: There is a 3.4 cm open wound anterolaterally in the mid left thigh at the site of the patient's recent procedure. This is superficial to multiple coarse calcifications within the subcutaneous fat which are chronic and unchanged. There is soft tissue stranding throughout the surrounding subcutaneous fat without focal fluid collection. No definite underlying inflammatory change or fluid collection is seen within the quadriceps musculature.  Chronic calcifications are present within the subcutaneous fat of both buttocks.  Sacroiliac degenerative changes are present bilaterally. The left hip  appears unremarkable. There is irregular subchondral sclerosis and cyst formation in both femoral condyles at the knee suspicious for chronic osteonecrosis. There is no evidence of acute fracture or bone destruction.  IMPRESSION: 1. Recent irrigation and debridement in the anterolateral left thigh with open wound. There are surrounding inflammatory changes in the subcutaneous fat, but no residual fluid collection identified. 2. Chronic soft tissue calcifications attributed to the patient's connective tissue disorder. 3. No evidence of osteomyelitis. 4. 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   Electronically Signed   By: Camie Patience M.D.   On: 12/25/2013 12:10    Anti-infectives: Anti-infectives    Start     Dose/Rate Route Frequency Ordered Stop   12/22/13 1200  vancomycin (VANCOCIN) IVPB 1000 mg/200 mL premix  Status:  Discontinued     1,000 mg200 mL/hr over 60 Minutes Intravenous Every 12 hours 12/22/13 0818 12/26/13 1104   12/21/13 1200  vancomycin (VANCOCIN) IVPB 750 mg/150 ml premix  Status:  Discontinued     750 mg150 mL/hr over 60 Minutes Intravenous Every 12 hours 12/21/13 0248 12/22/13 0817   12/21/13 0430  piperacillin-tazobactam (ZOSYN) IVPB 3.375 g  Status:  Discontinued     3.375 g12.5 mL/hr over 240 Minutes Intravenous 3 times per day 12/21/13 0415 12/26/13 0809   12/20/13 2345  vancomycin (VANCOCIN) IVPB 1000 mg/200 mL premix     1,000 mg200 mL/hr over 60 Minutes Intravenous  Once 12/20/13 2344 12/21/13 0147       Assessment/Plan  1. POD 5, s/p I&D of left thigh abscess  Plan: 1. Patient is surgically stable for dc home.  HH has been arranaged.  abx therapy per ID. 2. Follow up in our office in 3 weeks.   LOS: 7 days    Darel Ricketts E 12/27/2013, 9:22 AM Pager: [phone]

## 2013-12-27 NOTE — Progress Notes (Signed)
Patient ID: [name] DEELEY  female  OAC:[id number]    DOB: 10-18-1955    DOA: 12/20/2013  PCP: [name], MD  Brief history of present illness Patient is a 58 year old female with history of SLE, chronic pain syndrome, hypertension, diabetes, Sjogren's syndrome, Addison's disease, chronic peripheral edema presented with swelling of the left thigh, progressively worsening, redness and pain for 3 days.patient had a fever of 101 on the day of admission. Localized ultrasound showed left thigh abscess. Surgery was consulted and patient underwent I&D on 12/22/13. Wound culture growing acid-fast bacilli. infectious disease has been consulted.  Assessment/Plan: Principal Problem:   Abscess and cellulitis: left thigh - s/p I&D on 11/15 - Repeat CT on 11/18 did not show any further progression or residual abscess, does not need further surgical intervention per surgery  - Cultures showing no staph aureus, Gram stain showing 1+ acid-fast bacilli, ID following, recommended to wait 2-3 weeks for identification before empiric regimen, continue local wound care, dressing changes - Per Dr. Graylon Good, does not need TB precautions.  - D/w Dr Graylon Good, recommended oral augmentin or Bactrim for another 4 days   Active Problems:  Hypokalemia - Replaced oral potassium supplement  Lupus-followed at [facility] - Continue prednisone  Diabetes mellitus - Blood sugars controlled  Acute on chronic pain due to abscess in the left thigh -Continue Dilaudid and Norco, no further increase in pain meds     Neuropathy - continue Neurontin   essential Hypertension - currently stable  DVT Prophylaxis:  Code Status:full code  Family Communication:  Disposition: dc in am  Consultants: Gen. Surgery ID  Procedures:  INDon 11/15  Antibiotics:  IV vancomycin DC'd 11/18  IV Zosyn DC'd 11/18  Subjective: Patient seen and examined, no new complaints   Objective: Weight change:   Intake/Output  Summary (Last 24 hours) at 12/27/13 1145 Last data filed at 12/26/13 1940  Gross per 24 hour  Intake    240 ml  Output      0 ml  Net    240 ml   Blood pressure 105/49, pulse 70, temperature 97.8 F (36.6 C), temperature source Oral, resp. rate 18, height 5\' 4"  (1.626 m), weight 88.769 kg (195 lb 11.2 oz), SpO2 93 %.  Physical Exam: General: A x O x3, not in any acute distress. CVS: S1-S2 clear, no murmur rubs or gallops Chest: CTAB Abdomen: soft NT, ND, NBS  Extremities: no c/c/e bilaterally, left thigh with cellulitis and induration, tenderness above the dressing slightly improved    Lab Results: Basic Metabolic Panel:  Recent Labs Lab 12/25/13 0525  12/26/13 0430 12/27/13 0450  NA 137  --  141 140  K 2.6*  < > 2.9* 3.2*  CL 91*  --  93* 94*  CO2 34*  --  33* 33*  GLUCOSE 137*  --  107* 118*  BUN 12  --  10 12  CREATININE 0.92  --  0.76 0.92  CALCIUM 9.0  --  9.1 9.3  MG 1.9  --   --   --   < > = values in this interval not displayed. Liver Function Tests:  Recent Labs Lab 12/21/13 0528  AST 15  ALT 12  ALKPHOS 88  BILITOT 0.3  PROT 6.4  ALBUMIN 3.2*   No results for input(s): LIPASE, AMYLASE in the last 168 hours. No results for input(s): AMMONIA in the last 168 hours. CBC:  Recent Labs Lab 12/20/13 2300  12/26/13 0430 12/27/13 0450  WBC 9.3  < >  6.9 7.0  NEUTROABS 7.1  --   --   --   HGB 10.4*  < > 10.6* 10.3*  HCT 32.4*  < > 34.0* 33.0*  MCV 93.1  < > 92.4 91.9  PLT 192  < > 234 251  < > = values in this interval not displayed. Cardiac Enzymes: No results for input(s): CKTOTAL, CKMB, CKMBINDEX, TROPONINI in the last 168 hours. BNP: Invalid input(s): POCBNP CBG:  Recent Labs Lab 12/21/13 1957 12/22/13 1101  GLUCAP 87 85     Micro Results: Recent Results (from the past 240 hour(s))  Surgical pcr screen     Status: Abnormal   Collection Time: 12/21/13  5:15 PM  Result Value Ref Range Status   MRSA, PCR POSITIVE (A) NEGATIVE Final    Staphylococcus aureus POSITIVE (A) NEGATIVE Final    Comment:        The Xpert SA Assay (FDA approved for NASAL specimens in patients over 22 years of age), is one component of a comprehensive surveillance program.  Test performance has been validated by [employer] for patients greater than or equal to 51 year old. It is not intended to diagnose infection nor to guide or monitor treatment.   Anaerobic culture     Status: None (Preliminary result)   Collection Time: 12/22/13 10:13 AM  Result Value Ref Range Status   Specimen Description PENDING  Incomplete   Special Requests PENDING  Incomplete   Gram Stain   Final    ABUNDANT WBC PRESENT,BOTH PMN AND MONONUCLEAR NO SQUAMOUS EPITHELIAL CELLS SEEN NO ORGANISMS SEEN Performed at [facility]    Culture   Final    NO ANAEROBES ISOLATED; CULTURE IN PROGRESS FOR 5 DAYS Performed at [facility]    Report Status PENDING  Incomplete  AFB culture with smear     Status: None (Preliminary result)   Collection Time: 12/22/13 10:13 AM  Result Value Ref Range Status   Specimen Description PENDING  Incomplete   Special Requests PENDING  Incomplete   Acid Fast Smear   Final    1+ ACID FAST BACILLI SEEN CRITICAL RESULT CALLED TO, READ BACK BY AND VERIFIED WITH: DANA RN @ 3299 ON 12/24/13 BY [facility]IL[facility] CRITICAL RESULT CALLED TO, READ BACK BY AND VERIFIED WITH: [place] HD @ 2426 ON 12/24/13 BY WILSN Performed at [facility]    Culture   Final    CULTURE WILL BE EXAMINED FOR 6 WEEKS BEFORE ISSUING A FINAL REPORT Performed at [facility]    Report Status PENDING  Incomplete  Culture, routine-abscess     Status: None   Collection Time: 12/22/13 10:13 AM  Result Value Ref Range Status   Specimen Description ABSCESS LEFT THIGH  Final   Special Requests NONE  Final   Gram Stain   Final    ABUNDANT WBC PRESENT,BOTH PMN AND MONONUCLEAR NO SQUAMOUS EPITHELIAL CELLS SEEN NO ORGANISMS SEEN Performed at  [facility]    Culture   Final    MULTIPLE ORGANISMS PRESENT, NONE PREDOMINANT Note: NO STAPHYLOCOCCUS AUREUS ISOLATED NO GROUP A STREP (S.PYOGENES) ISOLATED Performed at [facility]    Report Status 12/26/2013 FINAL  Final    Studies/Results: Ct Femur Left Wo Contrast  12/25/2013   CLINICAL DATA:  Left upper thigh cellulitis. Previously treated with antibiotics and irrigation and drainage 4 months ago. Additional irrigation and drainage 12/22/2013. History of multiple medical problems including systemic lupus erythematosus, chronic pain syndrome, hypertension, diabetes  and Sjogren syndrome. Subsequent encounter.  EXAM: CT OF THE LEFT FEMUR WITHOUT CONTRAST  TECHNIQUE: Multidetector CT imaging was performed according to the standard protocol. Multiplanar CT image reconstructions were also generated.  COMPARISON:  Ultrasound 12/21/2013.  CT 08/23/2013.  FINDINGS: There is a 3.4 cm open wound anterolaterally in the mid left thigh at the site of the patient's recent procedure. This is superficial to multiple coarse calcifications within the subcutaneous fat which are chronic and unchanged. There is soft tissue stranding throughout the surrounding subcutaneous fat without focal fluid collection. No definite underlying inflammatory change or fluid collection is seen within the quadriceps musculature.  Chronic calcifications are present within the subcutaneous fat of both buttocks.  Sacroiliac degenerative changes are present bilaterally. The left hip appears unremarkable. There is irregular subchondral sclerosis and cyst formation in both femoral condyles at the knee suspicious for chronic osteonecrosis. There is no evidence of acute fracture or bone destruction.  IMPRESSION: 1. Recent irrigation and debridement in the anterolateral left thigh with open wound. There are surrounding inflammatory changes in the subcutaneous fat, but no residual fluid collection identified. 2. Chronic soft  tissue calcifications attributed to the patient's connective tissue disorder. 3. No evidence of osteomyelitis. 4. 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   Electronically Signed   By: Camie Patience M.D.   On: 12/25/2013 12:10   [place] Extrem Low Left Ltd  12/21/2013   CLINICAL DATA:  58 year old female with upper left thigh swelling and redness. History of previous abscess in this area 6 months ago.  EXAM: ULTRASOUND LEFT LOWER EXTREMITY LIMITED  TECHNIQUE: Ultrasound examination of the lower extremity soft tissues was performed in the area of clinical concern.  COMPARISON:  08/23/2013 CT 07/05/2013 ultrasound.  FINDINGS: Two separate hypoechoic areas within the upper left thigh in the area of redness are identified without internal vascular flow and suspicious for abscess/ infected collection or hematoma.  The more superficial hypoechoic area measures 4.1 x 2.5 x 4.1 cm and the deeper hypoechoic area measures 3.6 x 1.7 x 3.1 cm.  Overlying subcutaneous edema is noted.  IMPRESSION: Two separate hypoechoic areas within the upper left thigh underlying the area of redness suspicious for abscess/infected collection or possibly hematoma. The more superficial area measures 4.1 x 2.5 x 4.1 cm and the deeper area measures 3.6 x 1.7 x 3.1 cm.   Electronically Signed   By: Hassan Rowan M.D.   On: 12/21/2013 13:33    Medications: Scheduled Meds: . acidophilus  2 capsule Oral Daily  . amoxicillin-clavulanate  1 tablet Oral Q12H  . carisoprodol  350 mg Oral TID  . docusate sodium  200 mg Oral BID  . DULoxetine  60 mg Oral BID  . furosemide  40 mg Oral Daily  . gabapentin  800 mg Oral TID  . heparin  5,000 Units Subcutaneous 3 times per day  . nystatin   Topical BID  . pantoprazole  40 mg Oral Daily  . potassium chloride  40 mEq Oral BID  . predniSONE  7.5 mg Oral Q breakfast  . rOPINIRole  1 mg Oral QHS      LOS: 7 days   RAI,RIPUDEEP M.D. [facility] 12/27/2013, 11:45 AM Pager: [phone]  If  7PM-7AM, please contact night-coverage [email or web address] Password [email or web address]

## 2013-12-27 NOTE — Progress Notes (Signed)
    [facility] for Infectious Disease    Date of Admission:  12/20/2013   Total days of antibiotics 7        Day 7 vanco        Day 7 piptazo           ID: [name] Carr is a 58 y.o. female with left leg wound s/p debridement, found to have mixed skin flora plus 1+ AFB Principal Problem:   Abscess and cellulitis Active Problems:   Malignant neoplasm of female breast   Type 2 diabetes mellitus   Chronic pain syndrome   Immunocompromised patient   SLE-on prednisone-Follow at [facility]   Neuropathy   Hypertension    Subjective: Afebrile, still having morning pain  Medications:  . acidophilus  2 capsule Oral Daily  . amoxicillin-clavulanate  1 tablet Oral Q12H  . carisoprodol  350 mg Oral TID  . docusate sodium  200 mg Oral BID  . DULoxetine  60 mg Oral BID  . furosemide  40 mg Oral Daily  . gabapentin  800 mg Oral TID  . heparin  5,000 Units Subcutaneous 3 times per day  . nystatin   Topical BID  . pantoprazole  40 mg Oral Daily  . potassium chloride  40 mEq Oral BID  . predniSONE  7.5 mg Oral Q breakfast  . rOPINIRole  1 mg Oral QHS    Objective: Vital signs in last 24 hours: Temp:  [97.8 F (36.6 C)-99.2 F (37.3 C)] 97.8 F (36.6 C) (11/20 0500) Pulse Rate:  [70-90] 70 (11/20 0500) Resp:  [15-18] 18 (11/20 0500) BP: (105-124)/(49-65) 105/49 mmHg (11/20 0500) SpO2:  [93 %-97 %] 93 % (11/20 0500) Weight:  [195 lb 11.2 oz (88.769 kg)] 195 lb 11.2 oz (88.769 kg) (11/20 0500)  Physical Exam  Constitutional:  oriented to person, place, and time. appears well-developed and well-nourished. No distress.  HENT:  Mouth/Throat: Oropharynx is clear and moist. No oropharyngeal exudate.  Cardiovascular: Normal rate, regular rhythm and normal heart sounds. Exam reveals no gallop and no friction rub.  No murmur heard.  Pulmonary/Chest: Effort normal and breath sounds normal. No respiratory distress.  has no wheezes.  Skin: Skin is warm and dry. Left thigh still some  brusiing, erythema improved, bandaged from i x d    Lab Results  Recent Labs  12/26/13 0430 12/27/13 0450  WBC 6.9 7.0  HGB 10.6* 10.3*  HCT 34.0* 33.0*  NA 141 140  K 2.9* 3.2*  CL 93* 94*  CO2 33* 33*  BUN 10 12  CREATININE 0.76 0.92    Microbiology: 11/15 +1 AFB wound cx   Assessment/Plan: 58yo F with chronic left thigh wound concern for mycobacterial infection = identification still pending. Aerobic culture grew mixed flora. Erythema has improved since admission with empiric antibiotics. Recommend to do 4 addn days with amox/clav for a total 10 day treatment of cellulitis/wound. - continue with local wound care - remove picc prior to discharge if no longer needed We will have her come back to ID clinic in 3-4 wk to follow up on AFB wound cx and decide what needs to be treated  [facility], [facility] for Infectious Diseases Cell: [phone] Pager: [phone]  12/27/2013, 1:01 PM

## 2013-12-28 DIAGNOSIS — L03119 Cellulitis of unspecified part of limb: Secondary | ICD-10-CM

## 2013-12-28 DIAGNOSIS — L02419 Cutaneous abscess of limb, unspecified: Secondary | ICD-10-CM

## 2013-12-28 DIAGNOSIS — L03116 Cellulitis of left lower limb: Principal | ICD-10-CM

## 2013-12-28 DIAGNOSIS — L0291 Cutaneous abscess, unspecified: Secondary | ICD-10-CM | POA: Insufficient documentation

## 2013-12-28 LAB — CBC
HCT: 32.7 % — ABNORMAL LOW (ref 36.0–46.0)
Hemoglobin: 10.3 g/dL — ABNORMAL LOW (ref 12.0–15.0)
MCH: 29 pg (ref 26.0–34.0)
MCHC: 31.5 g/dL (ref 30.0–36.0)
MCV: 92.1 fL (ref 78.0–100.0)
Platelets: 243 10*3/uL (ref 150–400)
RBC: 3.55 MIL/uL — AB (ref 3.87–5.11)
RDW: 14.4 % (ref 11.5–15.5)
WBC: 7.1 10*3/uL (ref 4.0–10.5)

## 2013-12-28 LAB — BASIC METABOLIC PANEL
Anion gap: 14 (ref 5–15)
BUN: 14 mg/dL (ref 6–23)
CO2: 33 mEq/L — ABNORMAL HIGH (ref 19–32)
Calcium: 9.3 mg/dL (ref 8.4–10.5)
Chloride: 97 mEq/L (ref 96–112)
Creatinine, Ser: 0.79 mg/dL (ref 0.50–1.10)
GFR, EST NON AFRICAN AMERICAN: 89 mL/min — AB (ref >90)
Glucose, Bld: 103 mg/dL — ABNORMAL HIGH (ref 70–99)
POTASSIUM: 3.5 meq/L — AB (ref 3.7–5.3)
SODIUM: 144 meq/L (ref 137–147)

## 2013-12-28 MED ORDER — AMOXICILLIN-POT CLAVULANATE 875-125 MG PO TABS: 1.0000 | ORAL_TABLET | Freq: Two times a day (BID) | ORAL | Status: DC

## 2013-12-28 MED ORDER — POTASSIUM CHLORIDE CRYS ER 20 MEQ PO TBCR
40.0000 meq | EXTENDED_RELEASE_TABLET | Freq: Once | ORAL | Status: AC
  Administered 2013-12-28: 40 meq via ORAL
  Filled 2013-12-28: qty 2

## 2013-12-28 MED ORDER — HYDROCODONE-ACETAMINOPHEN 10-325 MG PO TABS
1.0000 | ORAL_TABLET | Freq: Once | ORAL | Status: AC
  Administered 2013-12-28: 1 via ORAL

## 2013-12-28 NOTE — Progress Notes (Signed)
Patient ID: [name] Carr  female  ZOX:[id number]    DOB: [birth year]    DOA: 12/20/2013  PCP: [name], MD  Brief history of present illness Patient is a 58 year old female with history of SLE, chronic pain syndrome, hypertension, diabetes, Sjogren's syndrome, Addison's disease, chronic peripheral edema presented with swelling of the left thigh, progressively worsening, redness and pain for 3 days.patient had a fever of 101 on the day of admission. Localized ultrasound showed left thigh abscess. Surgery was consulted and patient underwent I&D on 12/22/13. Wound culture growing acid-fast bacilli. infectious disease has been consulted.  Assessment/Plan: Principal Problem:   Abscess and cellulitis: left thigh - s/p I&D on 11/15 - Repeat CT on 11/18 did not show any further progression or residual abscess, does not need further surgical intervention per surgery  - Cultures showing no staph aureus, Gram stain showing 1+ acid-fast bacilli, ID following, recommended to wait 2-3 weeks for identification before empiric regimen, continue local wound care, dressing changes - Per Dr. Graylon Good, does not need TB precautions.  - I recommended over-the-counter Augmentin for another 4 days however patient has been refusing oral medications, does not want to take PICC line out, stating that "she always goes home with PICC line" D/w Dr Graylon Good, recommended oral augmentin if she tolerates or IV teflaro if she is not able to tolerate oral antibiotics.  Acute on chronic pain/pain management - Patient was seen twice today and somnolent, difficult to arouse the first time. Continues to demand for IV pain medications.  Patient asleep again after oral narcotics. Appears to have pain medication seeking behavior. - Continue Norco, Dilaudid discontinued   Hypokalemia - Replaced oral potassium supplement  Lupus-followed at [facility] - Continue prednisone  Diabetes mellitus - Blood sugars controlled   Neuropathy - continue Neurontin   essential Hypertension - currently stable  DVT Prophylaxis:  Code Status:full code  Family Communication:  Disposition: dc in am  Consultants: Gen. Surgery ID  Procedures:  INDon 11/15  Antibiotics:  IV vancomycin DC'd 11/18  IV Zosyn DC'd 11/18   Augmentin 11/20 (patient refusing)  Subjective: Patient seen and examined twice, first time in the morning was difficult to arouse.somnolent no fevers or chills or any worsening pain     Objective: Weight change: -5.625 kg (-12 lb 6.4 oz)  Intake/Output Summary (Last 24 hours) at 12/28/13 1215 Last data filed at 12/28/13 1000  Gross per 24 hour  Intake    240 ml  Output   1250 ml  Net  -1010 ml   Blood pressure 123/58, pulse 65, temperature 98.4 F (36.9 C), temperature source Axillary, resp. rate 17, height 5\' 4"  (1.626 m), weight 83.144 kg (183 lb 4.8 oz), SpO2 94 %.  Physical Exam: General: A x O x3, not in any acute distress. CVS: S1-S2 clear, no murmur rubs or gallops Chest: CTAB Abdomen: soft NT, ND, NBS  Extremities: no c/c/e bilaterally, left thigh with cellulitis and induration, tenderness above the dresssignificantly improved from admission Lab Results: Basic Metabolic Panel:  Recent Labs Lab 12/25/13 0525  12/27/13 0450 12/28/13 0525  NA 137  < > 140 144  K 2.6*  < > 3.2* 3.5*  CL 91*  < > 94* 97  CO2 34*  < > 33* 33*  GLUCOSE 137*  < > 118* 103*  BUN 12  < > 12 14  CREATININE 0.92  < > 0.92 0.79  CALCIUM 9.0  < > 9.3 9.3  MG 1.9  --   --   --   < > =  values in this interval not displayed. Liver Function Tests: No results for input(s): AST, ALT, ALKPHOS, BILITOT, PROT, ALBUMIN in the last 168 hours. No results for input(s): LIPASE, AMYLASE in the last 168 hours. No results for input(s): AMMONIA in the last 168 hours. CBC:  Recent Labs Lab 12/27/13 0450 12/28/13 0525  WBC 7.0 7.1  HGB 10.3* 10.3*  HCT 33.0* 32.7*  MCV 91.9 92.1  PLT 251 243    Cardiac Enzymes: No results for input(s): CKTOTAL, CKMB, CKMBINDEX, TROPONINI in the last 168 hours. BNP: Invalid input(s): POCBNP CBG:  Recent Labs Lab 12/21/13 1957 12/22/13 1101  GLUCAP 87 85     Micro Results: Recent Results (from the past 240 hour(s))  Surgical pcr screen     Status: Abnormal   Collection Time: 12/21/13  5:15 PM  Result Value Ref Range Status   MRSA, PCR POSITIVE (A) NEGATIVE Final   Staphylococcus aureus POSITIVE (A) NEGATIVE Final    Comment:        The Xpert SA Assay (FDA approved for NASAL specimens in patients over 7 years of age), is one component of a comprehensive surveillance program.  Test performance has been validated by [employer] for patients greater than or equal to 70 year old. It is not intended to diagnose infection nor to guide or monitor treatment.   Anaerobic culture     Status: None (Preliminary result)   Collection Time: 12/22/13 10:13 AM  Result Value Ref Range Status   Specimen Description PENDING  Incomplete   Special Requests PENDING  Incomplete   Gram Stain   Final    ABUNDANT WBC PRESENT,BOTH PMN AND MONONUCLEAR NO SQUAMOUS EPITHELIAL CELLS SEEN NO ORGANISMS SEEN Performed at [facility]    Culture   Final    NO ANAEROBES ISOLATED Performed at [facility]    Report Status PENDING  Incomplete  AFB culture with smear     Status: None (Preliminary result)   Collection Time: 12/22/13 10:13 AM  Result Value Ref Range Status   Specimen Description PENDING  Incomplete   Special Requests PENDING  Incomplete   Acid Fast Smear   Final    1+ ACID FAST BACILLI SEEN CRITICAL RESULT CALLED TO, READ BACK BY AND VERIFIED WITH: DANA RN @ 1941 ON 12/24/13 BY [facility] CRITICAL RESULT CALLED TO, READ BACK BY AND VERIFIED WITH: Tabiona HD @ 7408 ON 12/24/13 BY [facility] Performed at [facility]    Culture   Final    CULTURE WILL BE EXAMINED FOR 6 WEEKS BEFORE ISSUING A FINAL REPORT Performed  at [facility]    Report Status PENDING  Incomplete  Culture, routine-abscess     Status: None   Collection Time: 12/22/13 10:13 AM  Result Value Ref Range Status   Specimen Description ABSCESS LEFT THIGH  Final   Special Requests NONE  Final   Gram Stain   Final    ABUNDANT WBC PRESENT,BOTH PMN AND MONONUCLEAR NO SQUAMOUS EPITHELIAL CELLS SEEN NO ORGANISMS SEEN Performed at [facility]    Culture   Final    MULTIPLE ORGANISMS PRESENT, NONE PREDOMINANT Note: NO STAPHYLOCOCCUS AUREUS ISOLATED NO GROUP A STREP (S.PYOGENES) ISOLATED Performed at [facility]    Report Status 12/26/2013 FINAL  Final    Studies/Results: Ct Femur Left Wo Contrast  12/25/2013   CLINICAL DATA:  Left upper thigh cellulitis. Previously treated with antibiotics and irrigation and drainage 4 months ago. Additional irrigation and drainage 12/22/2013.  History of multiple medical problems including systemic lupus erythematosus, chronic pain syndrome, hypertension, diabetes and Sjogren syndrome. Subsequent encounter.  EXAM: CT OF THE LEFT FEMUR WITHOUT CONTRAST  TECHNIQUE: Multidetector CT imaging was performed according to the standard protocol. Multiplanar CT image reconstructions were also generated.  COMPARISON:  Ultrasound 12/21/2013.  CT 08/23/2013.  FINDINGS: There is a 3.4 cm open wound anterolaterally in the mid left thigh at the site of the patient's recent procedure. This is superficial to multiple coarse calcifications within the subcutaneous fat which are chronic and unchanged. There is soft tissue stranding throughout the surrounding subcutaneous fat without focal fluid collection. No definite underlying inflammatory change or fluid collection is seen within the quadriceps musculature.  Chronic calcifications are present within the subcutaneous fat of both buttocks.  Sacroiliac degenerative changes are present bilaterally. The left hip appears unremarkable. There is irregular  subchondral sclerosis and cyst formation in both femoral condyles at the knee suspicious for chronic osteonecrosis. There is no evidence of acute fracture or bone destruction.  IMPRESSION: 1. Recent irrigation and debridement in the anterolateral left thigh with open wound. There are surrounding inflammatory changes in the subcutaneous fat, but no residual fluid collection identified. 2. Chronic soft tissue calcifications attributed to the patient's connective tissue disorder. 3. No evidence of osteomyelitis. 4. 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   Electronically Signed   By: Camie Patience M.D.   On: 12/25/2013 12:10   [place] Extrem Low Left Ltd  12/21/2013   CLINICAL DATA:  58 year old female with upper left thigh swelling and redness. History of previous abscess in this area 6 months ago.  EXAM: ULTRASOUND LEFT LOWER EXTREMITY LIMITED  TECHNIQUE: Ultrasound examination of the lower extremity soft tissues was performed in the area of clinical concern.  COMPARISON:  08/23/2013 CT 07/05/2013 ultrasound.  FINDINGS: Two separate hypoechoic areas within the upper left thigh in the area of redness are identified without internal vascular flow and suspicious for abscess/ infected collection or hematoma.  The more superficial hypoechoic area measures 4.1 x 2.5 x 4.1 cm and the deeper hypoechoic area measures 3.6 x 1.7 x 3.1 cm.  Overlying subcutaneous edema is noted.  IMPRESSION: Two separate hypoechoic areas within the upper left thigh underlying the area of redness suspicious for abscess/infected collection or possibly hematoma. The more superficial area measures 4.1 x 2.5 x 4.1 cm and the deeper area measures 3.6 x 1.7 x 3.1 cm.   Electronically Signed   By: Hassan Rowan M.D.   On: 12/21/2013 13:33    Medications: Scheduled Meds: . acidophilus  2 capsule Oral Daily  . amoxicillin-clavulanate  1 tablet Oral Q12H  . carisoprodol  350 mg Oral TID  . docusate sodium  200 mg Oral BID  . DULoxetine  60 mg Oral  BID  . gabapentin  800 mg Oral TID  . heparin  5,000 Units Subcutaneous 3 times per day  . nystatin   Topical BID  . pantoprazole  40 mg Oral Daily  . potassium chloride  40 mEq Oral BID  . predniSONE  7.5 mg Oral Q breakfast  . rOPINIRole  1 mg Oral QHS      LOS: 8 days   RAI,RIPUDEEP M.D. [facility] 12/28/2013, 12:15 PM Pager: [phone]  If 7PM-7AM, please contact night-coverage [email or web address] Password [email or web address]

## 2013-12-28 NOTE — Progress Notes (Signed)
Patient complaining of pain in left leg wound site.  Rates it "9" out of 10.  She was medicated with 1 Vicodin 10/325 at 1814; not due until 2214.  Says she can't wait that long; says she's been waiting forever.  She says she takes 2 of these pain pills at home.  Paged Kathline Magic.

## 2013-12-28 NOTE — Progress Notes (Addendum)
[facility] for Infectious Disease    Date of Admission:  12/20/2013   Total days of antibiotics 8        Day 7 vanco        Day 7 piptazo           ID: [name] Carr is a 58 y.o. female with left leg wound s/p debridement, found to have mixed skin flora plus 1+ AFB Principal Problem:   Abscess and cellulitis Active Problems:   Malignant neoplasm of female breast   Type 2 diabetes mellitus   Chronic pain syndrome   Immunocompromised patient   SLE-on prednisone-Follow at [facility]   Neuropathy   Hypertension    Subjective: Afebrile, refused taking oral antibiotics yesterday, although did take one dose of augmentin, feels that her stomach is upset but no nausea. She is still in significant amount of pain. " Unclear how she can go home" -she said  Medications:  . acidophilus  2 capsule Oral Daily  . amoxicillin-clavulanate  1 tablet Oral Q12H  . carisoprodol  350 mg Oral TID  . docusate sodium  200 mg Oral BID  . DULoxetine  60 mg Oral BID  . gabapentin  800 mg Oral TID  . heparin  5,000 Units Subcutaneous 3 times per day  . nystatin   Topical BID  . pantoprazole  40 mg Oral Daily  . potassium chloride  40 mEq Oral BID  . predniSONE  7.5 mg Oral Q breakfast  . rOPINIRole  1 mg Oral QHS    Objective: Vital signs in last 24 hours: Temp:  [97.9 F (36.6 C)-98.6 F (37 C)] 98.4 F (36.9 C) (11/21 0746) Pulse Rate:  [65-91] 65 (11/21 0746) Resp:  [16-18] 17 (11/21 0746) BP: (107-135)/(57-60) 123/58 mmHg (11/21 0746) SpO2:  [92 %-100 %] 94 % (11/21 0746) Weight:  [183 lb 4.8 oz (83.144 kg)] 183 lb 4.8 oz (83.144 kg) (11/21 4580) Physical Exam  Constitutional:  oriented to person, place, and time. appears well-developed and well-nourished. No distress. Chronically ill appearing HENT:  Mouth/Throat: Oropharynx is clear and moist. No oropharyngeal exudate.  Cardiovascular: Normal rate, regular rhythm and normal heart sounds. Exam reveals no gallop and no friction rub.    No murmur heard.  Pulmonary/Chest: Effort normal and breath sounds normal. No respiratory distress.  has no wheezes.  Abdominal: Soft. Bowel sounds are normal.  exhibits no distension. There is no tenderness.  Lymphadenopathy: no cervical adenopathy.  Neurological: alert and oriented to person, place, and time.  Skin: upper thigh no longer erythematous Psychiatric: a normal mood and affect.behavior is normal.    Lab Results  Recent Labs  12/27/13 0450 12/28/13 0525  WBC 7.0 7.1  HGB 10.3* 10.3*  HCT 33.0* 32.7*  NA 140 144  K 3.2* 3.5*  CL 94* 97  CO2 33* 33*  BUN 12 14  CREATININE 0.92 0.79    Microbiology: 11/15 +1 AFB wound cx   Assessment/Plan: 58yo F with chronic left thigh wound concern for mycobacterial infection = identification of AFB still pending. Aerobic culture grew mixed flora. Erythema has improved since admission with empiric antibiotics. If she is unable to tolerate augmentin, then can do iv. Recommend to do 4 addn days with ceftaroline 600mg  IV Q12hr, then pull PICC line  - continue with local wound care -gerd = already on ppi  We will have her come back to ID clinic in 3-4 wk to follow up on AFB wound cx and decide  what needs to be treated  [facility], [facility] for Infectious Diseases Cell: [phone] Pager: [phone]  12/28/2013, 10:47 AM

## 2013-12-29 MED ORDER — HEPARIN SOD (PORK) LOCK FLUSH 100 UNIT/ML IV SOLN
250.0000 [IU] | INTRAVENOUS | Status: AC | PRN
  Administered 2013-12-29: 250 [IU]

## 2013-12-29 MED ORDER — SODIUM CHLORIDE 0.9 % IV SOLN
600.0000 mg | Freq: Two times a day (BID) | INTRAVENOUS | Status: DC
  Administered 2013-12-29: 600 mg via INTRAVENOUS
  Filled 2013-12-29 (×2): qty 600

## 2013-12-29 MED ORDER — SODIUM CHLORIDE 0.9 % IV SOLN: 600.0000 mg | Freq: Two times a day (BID) | INTRAVENOUS | Status: DC

## 2013-12-29 NOTE — Progress Notes (Signed)
Katy Fitch to be D/C'd Home per MD order.  Discussed with the patient and all questions fully answered.  VSS, Surgical site dressing changed this morning.  Site is clean with minimal serosanguinous drainage.    An After Visit Summary was printed and given to the patient. Patient received prescriptions.  Per MD, patient's PICC line will stay in for home antibiotics. Flushed and capped by IV team. Home Health RN will come to patient's house this evening to administer antibiotic.  D/c education completed with patient/family including follow up instructions, medication list, d/c activities limitations if indicated, with other d/c instructions as indicated by MD - patient able to verbalize understanding, all questions fully answered.   Patient instructed to return to ED, call 911, or call MD for any changes in condition.   Patient escorted via stretcher, and D/C home via [facility].  Micki Riley 12/29/2013 12:25 PM

## 2013-12-29 NOTE — Discharge Summary (Signed)
Physician Discharge Summary  Patient ID: [name] Carr MRN: [id number] DOB/AGE: 04/17/1955 58 y.o.  Admit date: 12/20/2013 Discharge date: 12/29/2013  Primary Care Physician:  [name], MD  Discharge Diagnoses:    . Abscess and cellulitis left thigh  . Hypertension . SLE-on prednisone-Follow at [facility] . Malignant neoplasm of female breast . Chronic pain syndrome . Immunocompromised patient  Consults:  Infectious disease, Dr. Graylon Good Gen. surgery, Dr. Georgette Dover   Recommendations for Outpatient Follow-up:  Patient was reminded to follow up with infectious disease and general surgery for posthospitalization follow-up  She refused oral antibiotics upon discharge, stating that it upsets her stomach. ID, Dr. Graylon Good recommended IV Teflaro for additional 4 days to complete the course. PICC line was placed. Home health was set up.  Allergies:   Allergies  Allergen Reactions  . Ammonia Other (See Comments)    Ended up on ventilator from ammonia tabs  . Contrast Media [Iodinated Diagnostic Agents] Other (See Comments)    Doesn't breath well.  . Iohexol Other (See Comments)    SOB   . Methotrexate Anaphylaxis  . Midazolam Hcl Anaphylaxis  . Nalbuphine Other (See Comments)    Can't breathe well  . Infliximab Nausea And Vomiting  . Ketorolac Tromethamine Other (See Comments)    Injectable doesn't work and pill hurts stomach.  . Morphine And Related Hives  . Nsaids Nausea And Vomiting  . Celecoxib Rash     Discharge Medications:   Medication List    STOP taking these medications        hydrochlorothiazide 25 MG tablet  Commonly known as:  HYDRODIURIL      TAKE these medications        acetaminophen 500 MG tablet  Commonly known as:  TYLENOL  Take 1,000 mg by mouth every 6 (six) hours as needed (pain).     alum & mag hydroxide-simeth 200-200-20 MG/5ML suspension  Commonly known as:  MAALOX/MYLANTA  Take 30 mLs by mouth every 6 (six) hours as needed for  indigestion or heartburn (dyspepsia).     bisacodyl 5 MG EC tablet  Generic drug:  bisacodyl  Take 5 mg by mouth daily as needed for moderate constipation.     carisoprodol 350 MG tablet  Commonly known as:  SOMA  Take one tablet by mouth three times daily; Take one tablet by mouth at bedtime     ceftaroline 600 mg in sodium chloride 0.9 % 250 mL  Inject 600 mg into the vein 2 (two) times daily. X 4 days     clonazePAM 1 MG tablet  Commonly known as:  KLONOPIN  Take one tablet by mouth twice daily     docusate sodium 100 MG capsule  Commonly known as:  COLACE  Take 200 mg by mouth 2 (two) times daily.     DULoxetine 60 MG capsule  Commonly known as:  CYMBALTA  Take 1 capsule (60 mg total) by mouth 2 (two) times daily.     EPIPEN 0.3 mg/0.3 mL Soaj injection  Generic drug:  EPINEPHrine  Inject 0.3 mg into the muscle once.     furosemide 20 MG tablet  Commonly known as:  LASIX  Take 40 mg by mouth daily.     gabapentin 800 MG tablet  Commonly known as:  NEURONTIN  Take 800 mg by mouth 3 (three) times daily.     HYDROcodone-acetaminophen 10-325 MG per tablet  Commonly known as:  NORCO  Take 1 tablet by mouth every 4 (four) hours  as needed for moderate pain or severe pain.     nystatin 100000 UNIT/GM Powd  Apply twice a day to affected areas     pantoprazole 40 MG tablet  Commonly known as:  PROTONIX  Take 40 mg by mouth daily.     potassium chloride SA 20 MEQ tablet  Commonly known as:  K-DUR,KLOR-CON  Take 2 tablets (40 mEq total) by mouth 2 (two) times daily.     predniSONE 5 MG tablet  Commonly known as:  DELTASONE  Take 7.5 mg by mouth daily with breakfast.     promethazine 25 MG suppository  Commonly known as:  PHENERGAN  Place 25 mg rectally every 6 (six) hours as needed for nausea or vomiting.     promethazine 12.5 MG tablet  Commonly known as:  PHENERGAN  Take 1 tablet (12.5 mg total) by mouth every 6 (six) hours as needed for nausea or vomiting.      rOPINIRole 1 MG tablet  Commonly known as:  REQUIP  Take 1 mg by mouth at bedtime.     senna-docusate 8.6-50 MG per tablet  Commonly known as:  Senokot-S  Take 1 tablet by mouth 2 (two) times daily as needed.     traZODone 50 MG tablet  Commonly known as:  DESYREL  Take 50-150 mg by mouth at bedtime as needed for sleep.         Brief H and P: For complete details please refer to admission H and P, but in brief patient is a 58 year old female with SLE, chronic pain syndrome, hypertension, diabetes, Sjogren's syndrome, Addison's disease, chronic leg swelling presented with complaints of left thigh swelling, worsening with redness and tenderness in the area. Patient was admitted for left thigh cellulitis.   Hospital Course:   Patient is a 58 year old female with history of SLE, chronic pain syndrome, hypertension, diabetes, Sjogren's syndrome, Addison's disease, chronic peripheral edema presented with swelling of the left thigh, progressively worsening, redness and pain for 3 days.patient had a fever of 101 on the day of admission. Localized ultrasound showed left thigh abscess. Surgery was consulted and patient underwent I&D on 12/22/13. Wound culture growing acid-fast bacilli. infectious disease was consulted.   Abscess and cellulitis: left thigh Gen. surgery was consulted and patient underwent incision and drainage on 11/15. CT was repeated on left thigh as patient was complaining of worsening pain and swelling on 11/18, did nott show any further progression or residual abscess, does not need further surgical intervention per surgery   Cultures showed no staph aureus, Gram stain showed 1+ acid-fast bacilli. Infectious disease was consulted, recommended to wait 2-3 weeks for identification before empiric regimen, continue local wound care, dressing changes. Dr. Graylon Good recommended Augmentin for another 4 days however patient adamantly refused oral antibiotics stating that she gets GI  upset and she 'always goes home with PICC line'. Dr. Graylon Good recommended changing to IV teflaro for additional 4 days. Home health nurse was arranged with the assistance from case management.  Acute on chronic pain/pain management Continue Norco as needed for pain  Hypokalemia- Replaced oral potassium supplement  Lupus-followed at [facility]- Continue prednisone  Diabetes mellitus- Blood sugars controlled   Neuropathy- continue Neurontin  essential Hypertension- currently stable  Day of Discharge BP 116/49 mmHg  Pulse 78  Temp(Src) 98.3 F (36.8 C) (Oral)  Resp 18  Ht 5\' 4"  (1.626 m)  Wt 83.144 kg (183 lb 4.8 oz)  BMI 31.45 kg/m2  SpO2 94%  Physical Exam: General:  Alert and awake oriented x3 not in any acute distress. CVS: S1-S2 clear no murmur rubs or gallops Chest: clear to auscultation bilaterally, no wheezing rales or rhonchi Abdomen: soft nontender, nondistended, normal bowel sounds Extremities: no cyanosis, clubbing or edema noted bilaterally, left thigh cellulitis/abscess significantly improved Neuro: Cranial nerves II-XII intact, no focal neurological deficits   The results of significant diagnostics from this hospitalization (including imaging, microbiology, ancillary and laboratory) are listed below for reference.    LAB RESULTS: Basic Metabolic Panel:  Recent Labs Lab 12/25/13 0525  12/27/13 0450 12/28/13 0525  NA 137  < > 140 144  K 2.6*  < > 3.2* 3.5*  CL 91*  < > 94* 97  CO2 34*  < > 33* 33*  GLUCOSE 137*  < > 118* 103*  BUN 12  < > 12 14  CREATININE 0.92  < > 0.92 0.79  CALCIUM 9.0  < > 9.3 9.3  MG 1.9  --   --   --   < > = values in this interval not displayed. Liver Function Tests: No results for input(s): AST, ALT, ALKPHOS, BILITOT, PROT, ALBUMIN in the last 168 hours. No results for input(s): LIPASE, AMYLASE in the last 168 hours. No results for input(s): AMMONIA in the last 168 hours. CBC:  Recent Labs Lab 12/27/13 0450  12/28/13 0525  WBC 7.0 7.1  HGB 10.3* 10.3*  HCT 33.0* 32.7*  MCV 91.9 92.1  PLT 251 243   Cardiac Enzymes: No results for input(s): CKTOTAL, CKMB, CKMBINDEX, TROPONINI in the last 168 hours. BNP: Invalid input(s): POCBNP CBG: No results for input(s): GLUCAP in the last 168 hours.  Significant Diagnostic Studies:  [place] Extrem Low Left Ltd  12/21/2013   CLINICAL DATA:  58 year old female with upper left thigh swelling and redness. History of previous abscess in this area 6 months ago.  EXAM: ULTRASOUND LEFT LOWER EXTREMITY LIMITED  TECHNIQUE: Ultrasound examination of the lower extremity soft tissues was performed in the area of clinical concern.  COMPARISON:  08/23/2013 CT 07/05/2013 ultrasound.  FINDINGS: Two separate hypoechoic areas within the upper left thigh in the area of redness are identified without internal vascular flow and suspicious for abscess/ infected collection or hematoma.  The more superficial hypoechoic area measures 4.1 x 2.5 x 4.1 cm and the deeper hypoechoic area measures 3.6 x 1.7 x 3.1 cm.  Overlying subcutaneous edema is noted.  IMPRESSION: Two separate hypoechoic areas within the upper left thigh underlying the area of redness suspicious for abscess/infected collection or possibly hematoma. The more superficial area measures 4.1 x 2.5 x 4.1 cm and the deeper area measures 3.6 x 1.7 x 3.1 cm.   Electronically Signed   By: Hassan Rowan M.D.   On: 12/21/2013 13:33    Disposition and Follow-up:     Discharge Instructions    Diet Carb Modified    Complete by:  As directed      Increase activity slowly    Complete by:  As directed             DISPOSITION: Home  DIET: Carb modified    DISCHARGE FOLLOW-UP Follow-up Information    Follow up with PARUCHURI,VAMSEE, MD. Schedule an appointment as soon as possible for a visit in 10 days.   Specialty:  Internal Medicine   Why:  for hospital follow-up      Follow up with CORNETT,THOMAS A., MD. Schedule an  appointment as soon as possible for a visit in 3 weeks.  Specialty:  General Surgery   Contact information:   [address] [place] [address] [phone]       Follow up with Michel Bickers, MD On 01/16/2014.   Specialty:  Infectious Diseases   Why:  AT 2:45PM    Contact information:   [address] [place] [address] [phone]       Time spent on Discharge: 40 mins  Signed:   RAI,RIPUDEEP M.D. [facility] 12/29/2013, 11:26 AM Pager: [phone]

## 2013-12-29 NOTE — Progress Notes (Signed)
CSW (Clinical Education officer, museum) notified by pt nurse that pt will need transportation home. Pt is unable to take a bus home and lives in [place]. CSW also noted that pt has a PICC line. CSW arranged for pt to have non-emergent ambulance pick up at 2:00pm. Address confirmed by pt. CSW notified pt and pt nurse. Pt has no further hospital social work needs.  [place], [place]

## 2014-01-10 ENCOUNTER — Telehealth: Payer: Self-pay | Admitting: *Deleted

## 2014-01-10 NOTE — Telephone Encounter (Signed)
Per call from Dr Windy Kalata called the patient to have her come to clinic on 01/14/14 and have the PICC removed as she is refusing to have her [facility] nurse do so. Patient advised she has an appt with Dr Megan Salon on Thursday 01/16/14 and she will wait until then as she has transportation issues and can only do afternoon appts. She advises she will take good care of the PICC and she wants to see a doctor before she has it out and last time it had to be replaced. Advised the patient will let [name] know and if she wants anything else I will call her back.

## 2014-01-16 ENCOUNTER — Inpatient Hospital Stay: Payer: Self-pay | Admitting: Internal Medicine

## 2014-01-28 ENCOUNTER — Other Ambulatory Visit: Payer: Self-pay | Admitting: Internal Medicine

## 2014-02-12 ENCOUNTER — Other Ambulatory Visit: Payer: Self-pay | Admitting: Lab

## 2014-02-12 ENCOUNTER — Ambulatory Visit: Payer: Self-pay | Admitting: Hematology & Oncology

## 2014-03-10 ENCOUNTER — Telehealth: Payer: Self-pay | Admitting: Hematology & Oncology

## 2014-03-10 ENCOUNTER — Other Ambulatory Visit: Payer: Medicare Other | Admitting: Lab

## 2014-03-10 ENCOUNTER — Ambulatory Visit: Payer: Self-pay | Admitting: Hematology & Oncology

## 2014-03-10 NOTE — Telephone Encounter (Signed)
Patient called and cx 03/10/14 apt due to fever and back pains.  She resch for 04/14/14.

## 2014-03-13 ENCOUNTER — Ambulatory Visit (INDEPENDENT_AMBULATORY_CARE_PROVIDER_SITE_OTHER): Payer: Medicare Other | Admitting: Internal Medicine

## 2014-03-13 ENCOUNTER — Encounter: Payer: Self-pay | Admitting: Internal Medicine

## 2014-03-13 VITALS — BP 156/87 | HR 85 | Temp 97.2°F | Wt 177.0 lb

## 2014-03-13 DIAGNOSIS — A318 Other mycobacterial infections: Secondary | ICD-10-CM | POA: Insufficient documentation

## 2014-03-13 MED ORDER — LEVOFLOXACIN IN D5W 750 MG/150ML IV SOLN: 750.0000 mg | INTRAVENOUS | Status: DC

## 2014-03-13 MED ORDER — DOXYCYCLINE HYCLATE 100 MG IV SOLR: 100.0000 mg | Freq: Two times a day (BID) | INTRAVENOUS | Status: DC

## 2014-03-13 NOTE — Progress Notes (Signed)
Patient ID: [name] Carr, female   DOB: 05/19/1955, 59 y.o.   MRN: [id number]         [facility] for Infectious Disease  Patient Active Problem List   Diagnosis Date Noted  . 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 Cellulitis and abscess of leg 08/22/2013    Priority: High  . Neuropathy 08/28/2013  . Hypertension   . Syncope 01/11/2012  . SLE-on prednisone-Follow at [facility] 02/05/2011  . DVT L IJ 12/26/10 02/04/2011  . Normocytic anemia 12/27/2010  . Malignant neoplasm of female breast 09/07/2009  . Type 2 diabetes mellitus 09/07/2009  . HYPERLIPIDEMIA 09/07/2009  . ANEMIA-IRON DEFICIENCY 09/07/2009  . Chronic pain syndrome 09/07/2009  . MIGRAINE HEADACHE 09/07/2009  . ALLERGIC RHINITIS 09/07/2009  . GERD 09/07/2009  . SEIZURE DISORDER 09/07/2009  . PERSONALITY DISORDER 09/01/2009  . DEPRESSION 09/01/2009  . RENAL INSUFFICIENCY 09/01/2009    Patient's Medications  New Prescriptions   No medications on file  Previous Medications   ACETAMINOPHEN (TYLENOL) 500 MG TABLET    Take 1,000 mg by mouth every 6 (six) hours as needed (pain).   ALUM & MAG HYDROXIDE-SIMETH (MAALOX/MYLANTA) 200-200-20 MG/5ML SUSPENSION    Take 30 mLs by mouth every 6 (six) hours as needed for indigestion or heartburn (dyspepsia).   BISACODYL (BISACODYL) 5 MG EC TABLET    Take 5 mg by mouth daily as needed for moderate constipation.   CARISOPRODOL (SOMA) 350 MG TABLET    Take one tablet by mouth three times daily; Take one tablet by mouth at bedtime   CLONAZEPAM (KLONOPIN) 1 MG TABLET    Take one tablet by mouth twice daily   DOCUSATE SODIUM (COLACE) 100 MG CAPSULE    Take 200 mg by mouth 2 (two) times daily.    DULOXETINE (CYMBALTA) 60 MG CAPSULE    Take 1 capsule (60 mg total) by mouth 2 (two) times daily.   EPINEPHRINE (EPIPEN) 0.3 MG/0.3 ML IJ SOAJ INJECTION    Inject 0.3 mg into the muscle once.   FUROSEMIDE (LASIX) 20 MG TABLET    Take 40 mg by mouth daily.    GABAPENTIN (NEURONTIN) 800 MG TABLET    Take 800 mg by mouth 3 (three) times daily.   HYDROCODONE-ACETAMINOPHEN (NORCO) 10-325 MG PER TABLET    Take 1 tablet by mouth every 4 (four) hours as needed for moderate pain or severe pain.   NYSTATIN (MYCOSTATIN/NYSTOP) 100000 UNIT/GM POWD    Apply twice a day to affected areas   PANTOPRAZOLE (PROTONIX) 40 MG TABLET    Take 40 mg by mouth daily.   POTASSIUM CHLORIDE SA (K-DUR,KLOR-CON) 20 MEQ TABLET    Take 2 tablets (40 mEq total) by mouth 2 (two) times daily.   PREDNISONE (DELTASONE) 5 MG TABLET    Take 7.5 mg by mouth daily with breakfast.   PROMETHAZINE (PHENERGAN) 12.5 MG TABLET    Take 1 tablet (12.5 mg total) by mouth every 6 (six) hours as needed for nausea or vomiting.   PROMETHAZINE (PHENERGAN) 25 MG SUPPOSITORY    Place 25 mg rectally every 6 (six) hours as needed for nausea or vomiting.   ROPINIROLE (REQUIP) 1 MG TABLET    Take 1 mg by mouth at bedtime.   SENNA-DOCUSATE (SENOKOT-S) 8.6-50 MG PER TABLET    Take 1 tablet by mouth 2 (two) times daily as needed.    TIZANIDINE (ZANAFLEX) 4 MG TABLET    Take 4 mg by mouth every 6 (  six) hours as needed for muscle spasms.   TRAZODONE (DESYREL) 50 MG TABLET    Take 50-150 mg by mouth at bedtime as needed for sleep.  Modified Medications   No medications on file  Discontinued Medications   CEFTAROLINE 600 MG IN SODIUM CHLORIDE 0.9 % 250 ML    Inject 600 mg into the vein 2 (two) times daily. X 4 days    Subjective: Ms. Nawaz is in for a hospital follow-up visit. 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 Carlyle Basques, for recurrent soft tissue infections. She has a history of MRSA infections. She was hospitalized here last July after suffering a scratch from a rose thorn on her left thigh. She had cellulitis and abscess at that time. I do not see that any cultures were obtained. She did previously been  hospitalized at [facility] with a left shoulder infection. When hospitalized here she was treated with ceftaroline and discharged to a skilled nursing facility. Because of cost reasons the antibiotic was changed to IV vancomycin. She saw another partner, Dr. Linus Salmons in clinic on 09/11/2013. He felt that her wound infection have been cured and stopped anabolic therapy at that time. She was readmitted here on 12/22/2013 with recurrent left thigh infection. She underwent incision and drainage. Cultures grew mixed bacterial flora without staph or strep. 1+ AFB was reported at the time of discharge. After discharge the culture was reported to be growing Mycobacterium fortuitum.  She was due to follow up in our clinic but tells me that she developed fever and worsening of her wound and was admitted to [facility] in December. She recalls being told that she had a PICC infection and had the PICC removed. Unfortunately I do not have access to those records other than finding out that she was discharged on 01/24/2014. She was supposed to take oral levofloxacin and doxycycline. However she states that she cannot tolerate any oral antibiotics. She tried taking them for 3 days but had nausea and vomiting and then stop them. She has not been on any antibiotic therapy since that time. She states that she continues to have swelling and discomfort in her left thigh. The size of the wound has decreased and she is having difficulty packing the wound. She no longer has any home wound care assistance. She says she's had some fevers up to 100.8 associated with some sweats. She tells me that she has had 22 different PICC lines in the past several years. She is due to see Dr. Brantley Stage, her general surgeon, soon for follow-up of her left thigh wound.  Review of Systems: Pertinent items are noted in HPI.  Past Medical History  Diagnosis Date  . PERSONALITY DISORDER   . Chronic pain syndrome      chronic narcotics, dependancy hx - [facility] for pain mgmt  . MIGRAINE HEADACHE   . Lyme disease   . ALLERGIC RHINITIS   . ANEMIA-IRON DEFICIENCY   . HYPERLIPIDEMIA   . GERD   . DIABETES MELLITUS, TYPE II   . DEPRESSION   . SLE (systemic lupus erythematosus)     [facility] - chronic pred  . Psoriasis   . Addison's disease   . Degenerative joint disease   . DDD (degenerative disc disease)   . Phlebitis after infusion     left ?calf; "S/P phenergan/demerol injection"  . Blood  clot in vein 2011; 02/04/11    "right jugular vein;left jugular vein"  . SEIZURE DISORDER     "lots of seizures; due to lupus"  . Anxiety   . Neuropathy     hands, feet, due to diabetes & back problem  . Surgical wound infection 02/15/2011    R shoulder and mastectomy site - related to PICC  . CARCINOMA, BREAST 08/2009 dx    R breast,  mets to liver - resolution s/p chemo, s/p B mastect  . Metastases to the liver   . RA (rheumatoid arthritis)   . Hypothyroidism pt states she has never had hy pothyroidism  . Hypertension     History  Substance Use Topics  . Smoking status: Never Smoker   . Smokeless tobacco: Never Used     Comment: Never used tobacco  . Alcohol Use: No    Family History  Problem Relation Age of Onset  . Lung cancer Mother   . Lung cancer Father   . Arthritis Other   . Diabetes Other   . Hyperlipidemia Other     Allergies  Allergen Reactions  . Ammonia Other (See Comments)    Ended up on ventilator from ammonia tabs  . Contrast Media [Iodinated Diagnostic Agents] Other (See Comments)    Doesn't breath well.  . Iohexol Other (See Comments)    SOB   . Methotrexate Anaphylaxis  . Midazolam Hcl Anaphylaxis  . Nalbuphine Other (See Comments)    Can't breathe well  . Infliximab Nausea And Vomiting  . Ketorolac Tromethamine Other (See Comments)    Injectable doesn't work and pill hurts stomach.  . Morphine And Related Hives  . Nsaids Nausea And Vomiting  . Celecoxib  Rash    Objective: Temp: 97.2 F (36.2 C) (02/04 1415) Temp Source: Oral (02/04 1415) BP: 156/87 mmHg (02/04 1415) Pulse Rate: 85 (02/04 1415)  General: She is smiling and in good spirits Skin: No rash Lungs: Clear Cor: Regular S1 and S2 with no murmur Abdomen: Soft and nontender She has a sinus tract that her left anterior thigh about the size of a pencil eraser at the lateral edge of her previous surgical incision. There is surrounding induration but no fluctuance or erythema. Dark yellow pus is expressible. The sinus tract appears to tunnel in several directions.    Assessment: I suspect that she has smoldering deep soft tissue infection in her left thigh due to Mycobacterium fortuitum. I called the lab this morning and asked them to submit the isolate for susceptibility testing. I will submit a swab culture of the wound drainage today for repeat AFB testing. She is adamant that she cannot tolerate any oral antibiotics. I will ask that a PICC be replaced and start her on IV levofloxacin and doxycycline. She may need to have the wound opened again for better packing. I'm concerned that if the wound closes off since she will simply reaccumulate abscess requiring readmission to the hospital. I'm also very concerned that she has extremely fragmented care across multiple health systems and multiple different medical providers.  Plan: 1. Submit November isolate of Mycobacterium fortuitum for susceptibility testing 2. Submit wound drainage for repeat AFB culture 3. Follow-up with Dr. Brantley Stage soon 4. PICC placement 5. Start IV levofloxacin and doxycycline 6. Follow-up here in 3 weeks 7. Attempt to obtain records from [place], [place] for [facility] [phone] pager   [phone] cell 03/13/2014, 2:57  PM

## 2014-03-13 NOTE — Addendum Note (Signed)
Addended by: Janyce Llanos F on: 03/13/2014 05:30 PM   Modules accepted: Orders

## 2014-03-13 NOTE — Addendum Note (Signed)
Addended by: Reggy Eye on: 03/13/2014 05:18 PM   Modules accepted: Orders

## 2014-03-14 NOTE — Addendum Note (Signed)
Addended by: Landis Gandy on: 03/14/2014 03:37 PM   Modules accepted: Orders

## 2014-03-18 ENCOUNTER — Telehealth: Payer: Self-pay | Admitting: *Deleted

## 2014-03-18 NOTE — Telephone Encounter (Signed)
Called the patient to advise her that she is scheduled for a PICC placement at [facility] on 03/20/14 at 1 pm. Advised her to arrive 30 min early and that she will be getting her first dose of antibiotics at home with [facility]. He information has been sent to [facility] and they have agreed to do the first dose of medication since she has had both medications before with no problems. The patient is aware of the PICC appt and Homecare arrangements.  The patient advised while telling her about the appt that her leg hurts worse and is red and swollen. She has not run any fevers but she has been having sweats and chills. Advised her will let the doctor know and call her back if anything needs to change.

## 2014-03-19 ENCOUNTER — Ambulatory Visit (HOSPITAL_COMMUNITY): Admission: RE | Admit: 2014-03-19 | Payer: Medicare Other | Source: Ambulatory Visit

## 2014-03-19 ENCOUNTER — Encounter: Payer: Self-pay | Admitting: *Deleted

## 2014-03-19 NOTE — Telephone Encounter (Signed)
Received a call from [facility] and was advised that the patient insurance company will not pay or in home therapy because she is not considered homebound and they will not be able to set up home IV therapy for the patient. Discussed this with Dr Megan Salon and was advised to try and get the patient in to see clinic doctor to discuss oral antibiotics or the patient could do SNF to do IV antibiotics. Was instructed to call the patient and inform. Per conversation with Dr Megan Salon called the patient and advised her that [facility] can not get her approved for home visits and we want to get her in to see clinic doctor to discuss oral vs SNF. The patient is not willing to do SNF or oral and was very upset that it was even suggested as she insist that she can not take oral and had a bad experience with a SNF in the past. She suggested I call [facility] then reconsidered as she stated they D/C her care last time because her RN thought she was injecting pain meds and she says she was not. She asked to speak with Dr Megan Salon advised her he is in clinic but could get him a message that she would like a call. The patient began to cry and ask to have him call her. Advised her we can not control the insurance guidelines but will let him know that she would like a call.

## 2014-03-19 NOTE — Telephone Encounter (Signed)
I agree with starting new antibiotics once PICC is placed on 03/20/2014.

## 2014-03-19 NOTE — Telephone Encounter (Signed)
Error

## 2014-03-20 ENCOUNTER — Telehealth: Payer: Self-pay

## 2014-03-20 ENCOUNTER — Ambulatory Visit (HOSPITAL_COMMUNITY): Admission: RE | Admit: 2014-03-20 | Payer: Medicare Other | Source: Ambulatory Visit

## 2014-03-20 NOTE — Telephone Encounter (Signed)
I spoke with [name] Carr today by phone. Based on what we have learned from [facility] I do not think a PICC line and IV antibiotic regimen is a workable solution. She remains adamant that she cannot tolerate any oral antibiotics although she tolerates many other oral medications. I do not think that she meets the Medicare definition of homebound. She states that she would not agree to a skilled nursing facility. I told her I would not order a PICC or IV antibiotics at this time. I asked her to reschedule a visit next week to discuss other options for management of her chronic thigh wound and Mycobacterium fortuitum infection. She was upset about this and tried to convince me otherwise.

## 2014-03-20 NOTE — Telephone Encounter (Signed)
Patient informed the need for office visit with physician to determine eligibility for home health services per Dr Megan Salon.   Date and time given.   03-25-14 @ 4:15 with Dr Linus Salmons.  She expressed concerns about seeing a physician other than Dr Megan Salon and I ensured her it was ok to see Dr Linus Salmons.   Laverle Patter, RN

## 2014-03-25 ENCOUNTER — Ambulatory Visit (INDEPENDENT_AMBULATORY_CARE_PROVIDER_SITE_OTHER): Payer: Medicare Other | Admitting: Internal Medicine

## 2014-03-25 ENCOUNTER — Encounter: Payer: Self-pay | Admitting: Internal Medicine

## 2014-03-25 VITALS — Ht 64.0 in | Wt 180.0 lb

## 2014-03-25 DIAGNOSIS — A318 Other mycobacterial infections: Secondary | ICD-10-CM

## 2014-03-25 MED ORDER — DOXYCYCLINE HYCLATE 100 MG PO TABS: 100.0000 mg | ORAL_TABLET | Freq: Two times a day (BID) | ORAL | Status: DC

## 2014-03-25 MED ORDER — AZITHROMYCIN 200 MG/5ML PO SUSR: 500.0000 mg | Freq: Every day | ORAL | Status: DC

## 2014-03-25 NOTE — Progress Notes (Signed)
   Subjective:    Patient ID: [name] Carr, female    DOB: May 25, 1955, 59 y.o.   MRN: [id number]  HPI He comes in here for a work in visit.  She has multiple medical problems but sees [place] in infectious diseases for abscess of the left thigh wound. 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 This is been difficult to treat since she has been unable to take oral medication. She takes other medications however she is unable to take oral antibiotics and is insisting on a PICC line with home health. She was seen by my partner earlier this month and was going to start on home health with doxycycline and Levaquin however she did not qualify as homebound and this was stopped.  She comes in today "to get homebound status". She thinks she is here for evaluation to be determined as homebound and then get home health. She comes in here walking with her friend who brings her in. She does use a cane.  She states in her conversation with Dr. Megan Salon that she was going to be determined to be homebound with her follow-up appointment here.   Review of Systems  Constitutional: Negative for fever and fatigue.  Gastrointestinal: Negative for nausea and diarrhea.  Skin: Positive for wound. Negative for rash.       Objective:   Physical Exam  Constitutional: She appears well-developed and well-nourished. No distress.  Eyes: No scleral icterus.  Skin:  Her wound is about 1/2 cm wide, circular with tunneling and no surrounding erythema, nontender and no pus          Assessment & Plan:

## 2014-03-25 NOTE — Assessment & Plan Note (Signed)
She has active infection and her swab from earlier this month also is growing mycobacteria. Her sensitivities are pending. I discussed with her the options and I agreed that she does not meet home bound status. Therefore I gave her the options of a SNIF versus oral medications. She was extremely reluctant however she is going to try doxycycline, which she has taken before for chronic Lyme disease, and liquid azithromycin which she was advised to the pediatric formulation and will try that. I did explain again that her options are limited.  I did explain that duration would be dependent on healing and typically can be months however at least a month and could reevaluate at that time but typically is long-term.  She does have follow-up already with Dr. Megan Salon in 2 weeks and will see how she's doing on the medication.

## 2014-03-31 ENCOUNTER — Other Ambulatory Visit (INDEPENDENT_AMBULATORY_CARE_PROVIDER_SITE_OTHER): Payer: Self-pay | Admitting: Surgery

## 2014-03-31 DIAGNOSIS — S71102S Unspecified open wound, left thigh, sequela: Secondary | ICD-10-CM

## 2014-04-02 ENCOUNTER — Ambulatory Visit (HOSPITAL_BASED_OUTPATIENT_CLINIC_OR_DEPARTMENT_OTHER): Admission: RE | Admit: 2014-04-02 | Payer: Medicare Other | Source: Ambulatory Visit

## 2014-04-08 ENCOUNTER — Ambulatory Visit: Payer: Self-pay | Admitting: Internal Medicine

## 2014-04-08 ENCOUNTER — Other Ambulatory Visit: Payer: Self-pay | Admitting: Internal Medicine

## 2014-04-08 ENCOUNTER — Telehealth: Payer: Self-pay | Admitting: *Deleted

## 2014-04-08 NOTE — Telephone Encounter (Signed)
Patient called today to cancel her office visit. She advised she has been vomiting all of the oral antibiotics up and is to sick to come. She wanted my to let the doctor know she would not be here and that she feels she needs the PICC still. She advised she has spoken to [facility] and was advised all the doctors have to do is sign the "Face-to-Face" form stating she is home bound and they will be able to see her. She advised she really wants this and is not going to give up. She would like for one of the doctors to call her if they could to discuss why she feels she needs the PICC and should be considered homebound. Advised her the doctors are in clinic and if she could make her appt that would be the perfect time to discuss her needs with the doctor. She advised that is not going to happen and to have one of the doctors call her. Advised her will send a message to the doctors and tried to reschedule her and she advised she will reschedule when somebody calls her.

## 2014-04-09 NOTE — Telephone Encounter (Signed)
Per Dr Megan Salon called the patient and advised her she will need to come for a visit to discuss her concerns. She advised she is to sick to come as she is homebound and ended the call.

## 2014-04-09 NOTE — Telephone Encounter (Signed)
Patient called back to advise that her phone went dead and she advised she is still not feeling well. She insist she can not come because she is to ill and has no ride. She advised she will call back later today after asking her family and friends if anyone can bring her and if she can she will make an appt then. She also stated she will bring her attorney to her next visit if necessary in order to get her PICC. I asked her to call us if she can schedule an appt and ended the call.

## 2014-04-09 NOTE — Telephone Encounter (Signed)
Please let her know that she will need a visit to discuss antibiotic therapy options.

## 2014-04-10 ENCOUNTER — Ambulatory Visit (HOSPITAL_BASED_OUTPATIENT_CLINIC_OR_DEPARTMENT_OTHER): Payer: Medicare Other

## 2014-04-14 ENCOUNTER — Other Ambulatory Visit: Payer: Medicare Other | Admitting: Lab

## 2014-04-14 ENCOUNTER — Ambulatory Visit (HOSPITAL_BASED_OUTPATIENT_CLINIC_OR_DEPARTMENT_OTHER): Payer: Medicare Other

## 2014-04-14 ENCOUNTER — Ambulatory Visit: Payer: Medicare Other | Admitting: Hematology & Oncology

## 2014-04-14 ENCOUNTER — Telehealth: Payer: Self-pay | Admitting: Hematology & Oncology

## 2014-04-14 NOTE — Telephone Encounter (Signed)
Pt cx 3-7 moved to 3-24 she is sick

## 2014-04-17 ENCOUNTER — Ambulatory Visit (HOSPITAL_BASED_OUTPATIENT_CLINIC_OR_DEPARTMENT_OTHER): Payer: Medicare Other

## 2014-04-22 ENCOUNTER — Telehealth: Payer: Self-pay | Admitting: *Deleted

## 2014-04-22 NOTE — Telephone Encounter (Signed)
Patient called stating that she feels her leg is worse and per her neighbors, needs to see a doctor.  Patient feels she is too sick for a doctor's visit, that she "is homebound" so she will be going to the [facility] ED with the goal to be transferred to [facility] for inpatient care.  She states she is ready for her "PICC or port, which ever the doctor feels is best" and is ready to restart home health care with [facility].  RN advised the patient that the doctors would need to see her before any medications are adjusted or access is ordered. Patient states she feels she is "sick enough for all of that to get started now."  She states she does not keep her doxycycline down, but cannot remember the last day she took the antibiotic.  She states that yesterday she "squeezed her wound and 2 cups of thick, green stuff came out."  Patient states she is "glad she squeezed it because last time that saved her leg" according to her surgeon.  She is upset that she has to do her own wound care and feels she should be sick enough by now to qualify for increased care. Landis Gandy, RN

## 2014-05-01 ENCOUNTER — Telehealth: Payer: Self-pay | Admitting: Hematology & Oncology

## 2014-05-01 ENCOUNTER — Other Ambulatory Visit: Payer: Medicare Other

## 2014-05-01 ENCOUNTER — Ambulatory Visit (HOSPITAL_BASED_OUTPATIENT_CLINIC_OR_DEPARTMENT_OTHER): Payer: Medicare Other

## 2014-05-01 ENCOUNTER — Ambulatory Visit: Payer: Medicare Other | Admitting: Family

## 2014-05-01 NOTE — Telephone Encounter (Signed)
Pt called to schedule appointment per MD I scheduled 3-30 with NP. Pt was very confusing about wanting CT, I tried to explain have order them for every appointment  But she wants Dr. Hildred Laser to get one next week and have [place] add a liver. I told her to leave detailed message with RN voice mail that they would have to talk to Dr. Marin Olp, I then transferred her to RN v/m

## 2014-05-01 NOTE — Telephone Encounter (Signed)
Pt cx 3-24 per Dr. Marin Olp don't call back to reschedule, pt has had many cx appointments

## 2014-05-02 ENCOUNTER — Telehealth: Payer: Self-pay | Admitting: *Deleted

## 2014-05-02 LAB — AFB CULTURE WITH SMEAR (NOT AT ARMC): Acid Fast Smear: NONE SEEN

## 2014-05-02 NOTE — Telephone Encounter (Signed)
Called patient and informed her that she has a standing order for the liver US which she wants. SHe can call downstairs to radiology to schedule this exam. Also informed her that Dr Marin Olp is frustrated with her continued cancellations, and that she needs to be sure she has reliable transportation to next Wednesday's appointment. Patient made several excuses but understood what was being said.

## 2014-05-06 ENCOUNTER — Telehealth: Payer: Self-pay | Admitting: *Deleted

## 2014-05-06 NOTE — Telephone Encounter (Signed)
Patient called, asking if she could get a PICC for her leg infection, feels her leg is worse.  She is "getting it scanned" per her surgeon tomorrow at [facility].  Still having trouble keeping her oral antibiotics down, states it is a problem with the "lupus around her stomach."  She states she takes her phenergan 40 minutes before the antibiotics, but that it just doesn't work.  She has tried suppository phenergan, but it does not work either. Patient states numerous times that she doesn't understand why she can't get a PICC line, that she is not able to take the oral antibiotics even though she tries, that her leg is worse.  She states she does not touch the wound, then in the next statement states that when she gets pus out of it when she pushes it like her previous home health nurse taught her to do.  She states that "there will be no problem, that [facility] knows she is homebound, that they are calling her all the time asking what the hold up is."  (Per Debbie at [facility], this is untrue.)  She does not yet have a port.  States that her physician is still working on this.  But she states that this could be an alternative to get iv antibiotics.    Patient does not answer questions directly when asked; keeps returning to the same comment "I just don't understand."  RN tried repeatedly to answer her concerns, redirect her when she was unable to answer questions.  Patient became tearful, stating that she "has been advised by other people like her brother to hire an attorney" because her leg is worse and she just doesn't understand why she can't get a PICC for treatment. She states she is homebound, that all she needs is for Dr. Megan Salon to sign the Face-to-Face sheet for her to resume care at [facility]. (RN spoke with Jackelyn Poling at [facility]. The doctor would be required to sign a face-to-face sheet claiming that the patient is home bound.  Homebound status means that the patient leaves the house only  for doctor visits, church, or to get their hair done.  Even if the physician signs this, the homehealth nurse would determine the patient's actual status at a home visit.  If the patient is NOT found to be truly homebound, homehealth would not be able to see the patient, would not be able to provide care, would not be reimbursed for any care already administered.  Thus, they are not able to take a patient unless they are truly homebound. RN attempted to explain this to the patient, but she kept repeating "[facility] knows I'm homebound. They want me to restart with them. I'm part of their family.")  RN allowed the patient to finish, advised her to keep her appointment tomorrow for imaging, to make sure that we received those results as well.  RN reminded the patient she should not be manipulating her wound without direction by the physician, that she needs to keep her appointments.  Patient given an appointment 4/4 1:45 with Dr. Linus Salmons (Dr. Megan Salon not available until 4/19, patient does not want to wait that long).  Patient is advised to go to the ED if her wound worsens, starts streaking or if she develops fevers.  Patient states that "because of her lupus, her fevers never register" but verbalized understanding and agreement. Landis Gandy, RN

## 2014-05-07 ENCOUNTER — Ambulatory Visit (HOSPITAL_BASED_OUTPATIENT_CLINIC_OR_DEPARTMENT_OTHER)
Admission: RE | Admit: 2014-05-07 | Discharge: 2014-05-07 | Disposition: A | Discharge status: Home / Self Care | Payer: Medicare Other | Source: Ambulatory Visit | Attending: Surgery | Admitting: Surgery

## 2014-05-07 ENCOUNTER — Ambulatory Visit (HOSPITAL_BASED_OUTPATIENT_CLINIC_OR_DEPARTMENT_OTHER): Payer: Medicare Other | Admitting: Family

## 2014-05-07 ENCOUNTER — Ambulatory Visit (HOSPITAL_BASED_OUTPATIENT_CLINIC_OR_DEPARTMENT_OTHER)
Admission: RE | Admit: 2014-05-07 | Discharge: 2014-05-07 | Disposition: A | Discharge status: Home / Self Care | Payer: Medicare Other | Source: Ambulatory Visit | Attending: Hematology & Oncology | Admitting: Hematology & Oncology

## 2014-05-07 ENCOUNTER — Other Ambulatory Visit (HOSPITAL_BASED_OUTPATIENT_CLINIC_OR_DEPARTMENT_OTHER): Payer: Medicare Other

## 2014-05-07 ENCOUNTER — Encounter: Payer: Self-pay | Admitting: Family

## 2014-05-07 VITALS — BP 129/68 | HR 74 | Temp 97.7°F | Resp 16 | Ht 65.0 in | Wt 174.0 lb

## 2014-05-07 DIAGNOSIS — X58XXXS Exposure to other specified factors, sequela: Secondary | ICD-10-CM | POA: Insufficient documentation

## 2014-05-07 DIAGNOSIS — R932 Abnormal findings on diagnostic imaging of liver and biliary tract: Secondary | ICD-10-CM

## 2014-05-07 DIAGNOSIS — L02419 Cutaneous abscess of limb, unspecified: Secondary | ICD-10-CM | POA: Insufficient documentation

## 2014-05-07 DIAGNOSIS — C787 Secondary malignant neoplasm of liver and intrahepatic bile duct: Secondary | ICD-10-CM | POA: Insufficient documentation

## 2014-05-07 DIAGNOSIS — C50919 Malignant neoplasm of unspecified site of unspecified female breast: Secondary | ICD-10-CM | POA: Insufficient documentation

## 2014-05-07 DIAGNOSIS — I878 Other specified disorders of veins: Secondary | ICD-10-CM

## 2014-05-07 DIAGNOSIS — D509 Iron deficiency anemia, unspecified: Secondary | ICD-10-CM | POA: Diagnosis present

## 2014-05-07 DIAGNOSIS — S71102S Unspecified open wound, left thigh, sequela: Secondary | ICD-10-CM

## 2014-05-07 DIAGNOSIS — M79652 Pain in left thigh: Secondary | ICD-10-CM | POA: Diagnosis not present

## 2014-05-07 DIAGNOSIS — L02416 Cutaneous abscess of left lower limb: Secondary | ICD-10-CM | POA: Diagnosis not present

## 2014-05-07 LAB — CBC WITH DIFFERENTIAL (CANCER CENTER ONLY)
BASO#: 0 10*3/uL (ref 0.0–0.2)
BASO%: 0.2 % (ref 0.0–2.0)
EOS%: 0.1 % (ref 0.0–7.0)
Eosinophils Absolute: 0 10*3/uL (ref 0.0–0.5)
HCT: 42.7 % (ref 34.8–46.6)
HEMOGLOBIN: 13.9 g/dL (ref 11.6–15.9)
LYMPH#: 1 10*3/uL (ref 0.9–3.3)
LYMPH%: 8.1 % — ABNORMAL LOW (ref 14.0–48.0)
MCH: 29.6 pg (ref 26.0–34.0)
MCHC: 32.6 g/dL (ref 32.0–36.0)
MCV: 91 fL (ref 81–101)
MONO#: 0.4 10*3/uL (ref 0.1–0.9)
MONO%: 3 % (ref 0.0–13.0)
NEUT#: 10.5 10*3/uL — ABNORMAL HIGH (ref 1.5–6.5)
NEUT%: 88.6 % — AB (ref 39.6–80.0)
Platelets: 260 10*3/uL (ref 145–400)
RBC: 4.69 10*6/uL (ref 3.70–5.32)
RDW: 14.3 % (ref 11.1–15.7)
WBC: 11.8 10*3/uL — ABNORMAL HIGH (ref 3.9–10.0)

## 2014-05-07 NOTE — Progress Notes (Signed)
Hematology and Oncology Follow Up Visit  [name] Carr [id number] 05-May-1955 59 y.o. 05/07/2014   Principle Diagnosis:  Metastatic breast cancer  Current Therapy:   Observation    Interim History:  [name] Carr is here today for a follow-up. She states that she is having problems with cellulitis of the left outer thigh. She is on PO antibiotics but is talking about going to the ED at [facility]. She would like a PICC line and IV antibiotics. We discussed her going down stairs to the ED here but she states that the friend that brought her today is not feeling well and that she also needs to make arrangements for her dogs. She states she may go tomorrow.  She denies fever, chills, n/v, cough, rash, dizziness, headaches, SOB, chest pain, palpitations, abdominal pain, constipation, diarrhea, blood in urine or stool. No episodes of bleeding. No lymphadenopathy.  No numbness or tingling in her extremities. No new aches or pain.  She had a flare with her lupus a few weeks ago and is on prednisone.  Her appetite is good and she is staying hydrated. Her weight is stable.   Medications:    Medication List       This list is accurate as of: 05/07/14  3:18 PM.  Always use your most recent med list.               acetaminophen 500 MG tablet  Commonly known as:  TYLENOL  Take 1,000 mg by mouth every 6 (six) hours as needed (pain).     alum & mag hydroxide-simeth 200-200-20 MG/5ML suspension  Commonly known as:  MAALOX/MYLANTA  Take 30 mLs by mouth every 6 (six) hours as needed for indigestion or heartburn (dyspepsia).     azithromycin 200 MG/5ML suspension  Commonly known as:  ZITHROMAX  Take 12.5 mLs (500 mg total) by mouth daily.     bisacodyl 5 MG EC tablet  Generic drug:  bisacodyl  Take 5 mg by mouth daily as needed for moderate constipation.     clonazePAM 1 MG tablet  Commonly known as:  KLONOPIN  Take one tablet by mouth twice daily     docusate sodium 100 MG capsule    Commonly known as:  COLACE  Take 200 mg by mouth 2 (two) times daily.     doxycycline 100 MG tablet  Commonly known as:  VIBRA-TABS  Take 1 tablet (100 mg total) by mouth 2 (two) times daily.     DULoxetine 60 MG capsule  Commonly known as:  CYMBALTA  Take 1 capsule (60 mg total) by mouth 2 (two) times daily.     EPIPEN 0.3 mg/0.3 mL Soaj injection  Generic drug:  EPINEPHrine  Inject 0.3 mg into the muscle once.     furosemide 20 MG tablet  Commonly known as:  LASIX  Take 40 mg by mouth daily.     gabapentin 800 MG tablet  Commonly known as:  NEURONTIN  Take 800 mg by mouth 3 (three) times daily.     hydrochlorothiazide 25 MG tablet  Commonly known as:  HYDRODIURIL  Take 25 mg by mouth.     HYDROcodone-acetaminophen 10-325 MG per tablet  Commonly known as:  NORCO  Take 1 tablet by mouth every 4 (four) hours as needed for moderate pain or severe pain.     nystatin 100000 UNIT/GM Powd  Apply twice a day to affected areas     pantoprazole 40 MG tablet  Commonly known as:  PROTONIX  Take 40 mg by mouth daily.     potassium chloride SA 20 MEQ tablet  Commonly known as:  K-DUR,KLOR-CON  TAKE 1 TABLET BY MOUTH TWICE DAILY     predniSONE 5 MG tablet  Commonly known as:  DELTASONE  Take 7.5 mg by mouth daily with breakfast.     promethazine 25 MG suppository  Commonly known as:  PHENERGAN  Place 25 mg rectally every 6 (six) hours as needed for nausea or vomiting.     promethazine 12.5 MG tablet  Commonly known as:  PHENERGAN  Take 1 tablet (12.5 mg total) by mouth every 6 (six) hours as needed for nausea or vomiting.     rOPINIRole 1 MG tablet  Commonly known as:  REQUIP  Take 1 mg by mouth at bedtime.     senna-docusate 8.6-50 MG per tablet  Commonly known as:  Senokot-S  Take 1 tablet by mouth 2 (two) times daily as needed.     spironolactone 25 MG tablet  Commonly known as:  ALDACTONE  Take by mouth.     tiZANidine 4 MG tablet  Commonly known as:   ZANAFLEX  Take 4 mg by mouth every 6 (six) hours as needed for muscle spasms.     traZODone 50 MG tablet  Commonly known as:  DESYREL  Take 50-150 mg by mouth at bedtime as needed for sleep.        Allergies:  Allergies  Allergen Reactions  . Ammonia Other (See Comments)    Ended up on ventilator from ammonia tabs  . Contrast Media [Iodinated Diagnostic Agents] Other (See Comments)    Doesn't breath well.  . Iohexol Other (See Comments)    SOB   . Methotrexate Anaphylaxis  . Midazolam Hcl Anaphylaxis  . Nalbuphine Other (See Comments)    Can't breathe well  . Infliximab Nausea And Vomiting  . Ketorolac Tromethamine Other (See Comments)    Injectable doesn't work and pill hurts stomach.  . Morphine And Related Hives  . Nsaids Nausea And Vomiting  . Celecoxib Rash    Past Medical History, Surgical history, Social history, and Family History were reviewed and updated.  Review of Systems: All other 10 point review of systems is negative.   Physical Exam:  vitals were not taken for this visit.  Wt Readings from Last 3 Encounters:  03/25/14 180 lb (81.647 kg)  03/13/14 177 lb (80.287 kg)  12/28/13 183 lb 4.8 oz (83.144 kg)    Ocular: Sclerae unicteric, pupils equal, round and reactive to light Ear-nose-throat: Oropharynx clear, dentition fair Lymphatic: No cervical or supraclavicular adenopathy Lungs no rales or rhonchi, good excursion bilaterally Heart regular rate and rhythm, no murmur appreciated Abd soft, nontender, positive bowel sounds MSK no focal spinal tenderness, no joint edema Neuro: non-focal, well-oriented, appropriate affect Breasts: No changes. Mastectomy sites intact, no lymphadenopathy.   Lab Results  Component Value Date   WBC 7.1 12/28/2013   HGB 10.3* 12/28/2013   HCT 32.7* 12/28/2013   MCV 92.1 12/28/2013   PLT 243 12/28/2013   Lab Results  Component Value Date   FERRITIN 478* 09/12/2013   IRON 64 09/12/2013   TIBC 302 09/12/2013    UIBC 238 09/12/2013   IRONPCTSAT 21 09/12/2013   Lab Results  Component Value Date   RETICCTPCT 2.19* 09/27/2011   RBC 3.55* 12/28/2013   RETICCTABS 88.48 09/27/2011   No results found for: KPAFRELGTCHN, LAMBDASER, KAPLAMBRATIO No results found for: IGGSERUM, IGA, IGMSERUM No results  found for: Odetta Pink, SPEI   Chemistry      Component Value Date/Time   NA 144 12/28/2013 0525   NA 135 04/25/2013 1439   NA 135* 04/08/2013 1148   K 3.5* 12/28/2013 0525   K 3.5 04/25/2013 1439   K 4.6 04/08/2013 1148   CL 97 12/28/2013 0525   CL 90* 04/25/2013 1439   CL 91* 03/15/2012 1410   CO2 33* 12/28/2013 0525   CO2 32 04/25/2013 1439   CO2 28 04/08/2013 1148   BUN 14 12/28/2013 0525   BUN 26* 04/25/2013 1439   BUN 29.9* 04/08/2013 1148   CREATININE 0.79 12/28/2013 0525   CREATININE 0.94 09/11/2013 1718   CREATININE 1.2* 04/08/2013 1148      Component Value Date/Time   CALCIUM 9.3 12/28/2013 0525   CALCIUM 9.9 04/25/2013 1439   CALCIUM 9.6 04/08/2013 1148   ALKPHOS 88 12/21/2013 0528   ALKPHOS 102* 04/25/2013 1439   ALKPHOS 100 04/08/2013 1148   AST 15 12/21/2013 0528   AST 21 04/25/2013 1439   AST 17 04/08/2013 1148   ALT 12 12/21/2013 0528   ALT 36 04/25/2013 1439   ALT 18 04/08/2013 1148   BILITOT 0.3 12/21/2013 0528   BILITOT 0.70 04/25/2013 1439   BILITOT 0.34 04/08/2013 1148     Impression and Plan: [name] Carr is 59 year old female with metastatic breast cancer. She has not had any therapy for over 3 years. From our standpoint she has not had any issues and seems to be getting along ok.  Her WBC is 11.8 today. She has had no fevers.  She states that she will possibly go to the ED tomorrow for her cellulitis if she can find someone to keep her dogs.  We will continue to follow along with her. We will see her back in 6 months.  She knows to call here with any questions or concerns. We can certainly see her back  sooner if need be.   Eliezer Bottom, NP 3/30/20163:18 PM

## 2014-05-08 ENCOUNTER — Telehealth: Payer: Self-pay | Admitting: Hematology & Oncology

## 2014-05-08 ENCOUNTER — Encounter (HOSPITAL_COMMUNITY): Payer: Self-pay

## 2014-05-08 ENCOUNTER — Inpatient Hospital Stay (HOSPITAL_COMMUNITY)
Admission: EM | Admit: 2014-05-08 | Discharge: 2014-05-14 | DRG: 571 | Disposition: A | Discharge status: Home Health | Payer: Medicare Other | Attending: Internal Medicine | Admitting: Internal Medicine

## 2014-05-08 DIAGNOSIS — Z79899 Other long term (current) drug therapy: Secondary | ICD-10-CM | POA: Diagnosis not present

## 2014-05-08 DIAGNOSIS — Z79891 Long term (current) use of opiate analgesic: Secondary | ICD-10-CM

## 2014-05-08 DIAGNOSIS — M069 Rheumatoid arthritis, unspecified: Secondary | ICD-10-CM | POA: Diagnosis present

## 2014-05-08 DIAGNOSIS — F1910 Other psychoactive substance abuse, uncomplicated: Secondary | ICD-10-CM | POA: Diagnosis not present

## 2014-05-08 DIAGNOSIS — Z9013 Acquired absence of bilateral breasts and nipples: Secondary | ICD-10-CM | POA: Diagnosis present

## 2014-05-08 DIAGNOSIS — C787 Secondary malignant neoplasm of liver and intrahepatic bile duct: Secondary | ICD-10-CM | POA: Diagnosis present

## 2014-05-08 DIAGNOSIS — A318 Other mycobacterial infections: Secondary | ICD-10-CM | POA: Diagnosis not present

## 2014-05-08 DIAGNOSIS — A319 Mycobacterial infection, unspecified: Secondary | ICD-10-CM | POA: Diagnosis present

## 2014-05-08 DIAGNOSIS — G8929 Other chronic pain: Secondary | ICD-10-CM | POA: Diagnosis not present

## 2014-05-08 DIAGNOSIS — L02416 Cutaneous abscess of left lower limb: Secondary | ICD-10-CM | POA: Diagnosis not present

## 2014-05-08 DIAGNOSIS — Z7952 Long term (current) use of systemic steroids: Secondary | ICD-10-CM | POA: Diagnosis not present

## 2014-05-08 DIAGNOSIS — B9561 Methicillin susceptible Staphylococcus aureus infection as the cause of diseases classified elsewhere: Secondary | ICD-10-CM | POA: Diagnosis present

## 2014-05-08 DIAGNOSIS — C50919 Malignant neoplasm of unspecified site of unspecified female breast: Secondary | ICD-10-CM | POA: Diagnosis present

## 2014-05-08 DIAGNOSIS — Z9221 Personal history of antineoplastic chemotherapy: Secondary | ICD-10-CM

## 2014-05-08 DIAGNOSIS — Z885 Allergy status to narcotic agent status: Secondary | ICD-10-CM | POA: Diagnosis not present

## 2014-05-08 DIAGNOSIS — M1990 Unspecified osteoarthritis, unspecified site: Secondary | ICD-10-CM | POA: Diagnosis present

## 2014-05-08 DIAGNOSIS — Z792 Long term (current) use of antibiotics: Secondary | ICD-10-CM | POA: Diagnosis not present

## 2014-05-08 DIAGNOSIS — E876 Hypokalemia: Secondary | ICD-10-CM | POA: Diagnosis present

## 2014-05-08 DIAGNOSIS — F419 Anxiety disorder, unspecified: Secondary | ICD-10-CM | POA: Diagnosis present

## 2014-05-08 DIAGNOSIS — Z8672 Personal history of thrombophlebitis: Secondary | ICD-10-CM

## 2014-05-08 DIAGNOSIS — E039 Hypothyroidism, unspecified: Secondary | ICD-10-CM | POA: Diagnosis present

## 2014-05-08 DIAGNOSIS — L0390 Cellulitis, unspecified: Secondary | ICD-10-CM | POA: Diagnosis present

## 2014-05-08 DIAGNOSIS — F603 Borderline personality disorder: Secondary | ICD-10-CM | POA: Diagnosis present

## 2014-05-08 DIAGNOSIS — B999 Unspecified infectious disease: Secondary | ICD-10-CM | POA: Diagnosis present

## 2014-05-08 DIAGNOSIS — Z7289 Other problems related to lifestyle: Secondary | ICD-10-CM | POA: Diagnosis not present

## 2014-05-08 DIAGNOSIS — M79652 Pain in left thigh: Secondary | ICD-10-CM | POA: Diagnosis present

## 2014-05-08 DIAGNOSIS — Z888 Allergy status to other drugs, medicaments and biological substances status: Secondary | ICD-10-CM

## 2014-05-08 DIAGNOSIS — F339 Major depressive disorder, recurrent, unspecified: Secondary | ICD-10-CM | POA: Diagnosis present

## 2014-05-08 DIAGNOSIS — E271 Primary adrenocortical insufficiency: Secondary | ICD-10-CM | POA: Diagnosis present

## 2014-05-08 DIAGNOSIS — Z853 Personal history of malignant neoplasm of breast: Secondary | ICD-10-CM | POA: Diagnosis not present

## 2014-05-08 DIAGNOSIS — I10 Essential (primary) hypertension: Secondary | ICD-10-CM | POA: Diagnosis present

## 2014-05-08 DIAGNOSIS — Z91041 Radiographic dye allergy status: Secondary | ICD-10-CM

## 2014-05-08 DIAGNOSIS — M329 Systemic lupus erythematosus, unspecified: Secondary | ICD-10-CM | POA: Diagnosis not present

## 2014-05-08 DIAGNOSIS — Z833 Family history of diabetes mellitus: Secondary | ICD-10-CM | POA: Diagnosis not present

## 2014-05-08 DIAGNOSIS — G40909 Epilepsy, unspecified, not intractable, without status epilepticus: Secondary | ICD-10-CM | POA: Diagnosis present

## 2014-05-08 DIAGNOSIS — E785 Hyperlipidemia, unspecified: Secondary | ICD-10-CM | POA: Diagnosis present

## 2014-05-08 DIAGNOSIS — E119 Type 2 diabetes mellitus without complications: Secondary | ICD-10-CM

## 2014-05-08 DIAGNOSIS — Z801 Family history of malignant neoplasm of trachea, bronchus and lung: Secondary | ICD-10-CM

## 2014-05-08 DIAGNOSIS — K219 Gastro-esophageal reflux disease without esophagitis: Secondary | ICD-10-CM | POA: Diagnosis present

## 2014-05-08 DIAGNOSIS — G894 Chronic pain syndrome: Secondary | ICD-10-CM | POA: Diagnosis present

## 2014-05-08 DIAGNOSIS — F609 Personality disorder, unspecified: Secondary | ICD-10-CM | POA: Diagnosis present

## 2014-05-08 DIAGNOSIS — L02419 Cutaneous abscess of limb, unspecified: Secondary | ICD-10-CM | POA: Diagnosis not present

## 2014-05-08 DIAGNOSIS — Z765 Malingerer [conscious simulation]: Secondary | ICD-10-CM | POA: Insufficient documentation

## 2014-05-08 DIAGNOSIS — B9562 Methicillin resistant Staphylococcus aureus infection as the cause of diseases classified elsewhere: Secondary | ICD-10-CM | POA: Diagnosis not present

## 2014-05-08 DIAGNOSIS — B9689 Other specified bacterial agents as the cause of diseases classified elsewhere: Secondary | ICD-10-CM | POA: Diagnosis present

## 2014-05-08 LAB — COMPREHENSIVE METABOLIC PANEL
ALBUMIN: 4.1 g/dL (ref 3.5–5.2)
ALT: 18 U/L (ref 0–35)
AST: 19 U/L (ref 0–37)
Alkaline Phosphatase: 97 U/L (ref 39–117)
Anion gap: 11 (ref 5–15)
BUN: 14 mg/dL (ref 6–23)
CALCIUM: 9.1 mg/dL (ref 8.4–10.5)
CO2: 30 mmol/L (ref 19–32)
Chloride: 96 mmol/L (ref 96–112)
Creatinine, Ser: 0.88 mg/dL (ref 0.50–1.10)
GFR calc non Af Amer: 71 mL/min — ABNORMAL LOW (ref >90)
GFR, EST AFRICAN AMERICAN: 82 mL/min — AB (ref >90)
GLUCOSE: 105 mg/dL — AB (ref 70–99)
Potassium: 3.1 mmol/L — ABNORMAL LOW (ref 3.5–5.1)
SODIUM: 137 mmol/L (ref 135–145)
TOTAL PROTEIN: 7.7 g/dL (ref 6.0–8.3)
Total Bilirubin: 0.3 mg/dL (ref 0.3–1.2)

## 2014-05-08 LAB — CBC WITH DIFFERENTIAL/PLATELET
Basophils Absolute: 0 10*3/uL (ref 0.0–0.1)
Basophils Relative: 0 % (ref 0–1)
EOS ABS: 0 10*3/uL (ref 0.0–0.7)
Eosinophils Relative: 0 % (ref 0–5)
HEMATOCRIT: 36.2 % (ref 36.0–46.0)
Hemoglobin: 11.8 g/dL — ABNORMAL LOW (ref 12.0–15.0)
LYMPHS ABS: 1.6 10*3/uL (ref 0.7–4.0)
LYMPHS PCT: 14 % (ref 12–46)
MCH: 29.9 pg (ref 26.0–34.0)
MCHC: 32.6 g/dL (ref 30.0–36.0)
MCV: 91.9 fL (ref 78.0–100.0)
MONO ABS: 0.6 10*3/uL (ref 0.1–1.0)
Monocytes Relative: 5 % (ref 3–12)
Neutro Abs: 9.1 10*3/uL — ABNORMAL HIGH (ref 1.7–7.7)
Neutrophils Relative %: 81 % — ABNORMAL HIGH (ref 43–77)
PLATELETS: 248 10*3/uL (ref 150–400)
RBC: 3.94 MIL/uL (ref 3.87–5.11)
RDW: 14.1 % (ref 11.5–15.5)
WBC: 11.4 10*3/uL — ABNORMAL HIGH (ref 4.0–10.5)

## 2014-05-08 LAB — COMPREHENSIVE METABOLIC PANEL WITH GFR
ALT: 15 U/L (ref 0–35)
AST: 16 U/L (ref 0–37)
Albumin: 4.7 g/dL (ref 3.5–5.2)
Alkaline Phosphatase: 113 U/L (ref 39–117)
BUN: 12 mg/dL (ref 6–23)
CO2: 29 meq/L (ref 19–32)
Calcium: 10.2 mg/dL (ref 8.4–10.5)
Chloride: 93 meq/L — ABNORMAL LOW (ref 96–112)
Creatinine, Ser: 0.9 mg/dL (ref 0.50–1.10)
Glucose, Bld: 130 mg/dL — ABNORMAL HIGH (ref 70–99)
Potassium: 3.5 meq/L (ref 3.5–5.3)
Sodium: 136 meq/L (ref 135–145)
Total Bilirubin: 0.7 mg/dL (ref 0.2–1.2)
Total Protein: 8.3 g/dL (ref 6.0–8.3)

## 2014-05-08 LAB — IRON AND TIBC
%SAT: 6 % — AB (ref 20–55)
Iron: 20 ug/dL — ABNORMAL LOW (ref 42–145)
TIBC: 321 ug/dL (ref 250–470)
UIBC: 301 ug/dL (ref 125–400)

## 2014-05-08 LAB — CANCER ANTIGEN 27.29: CA 27.29: 39 U/mL (ref 0–39)

## 2014-05-08 LAB — FERRITIN: Ferritin: 336 ng/mL — ABNORMAL HIGH (ref 10–291)

## 2014-05-08 LAB — VITAMIN D 25 HYDROXY (VIT D DEFICIENCY, FRACTURES): VIT D 25 HYDROXY: 23 ng/mL — AB (ref 30–100)

## 2014-05-08 MED ORDER — PROMETHAZINE HCL 25 MG PO TABS
25.0000 mg | ORAL_TABLET | Freq: Once | ORAL | Status: AC
  Administered 2014-05-08: 25 mg via ORAL
  Filled 2014-05-08: qty 1

## 2014-05-08 MED ORDER — SODIUM CHLORIDE 0.9 % IV SOLN
INTRAVENOUS | Status: AC
  Administered 2014-05-08 – 2014-05-09 (×2): via INTRAVENOUS

## 2014-05-08 MED ORDER — SODIUM CHLORIDE 0.9 % IV SOLN: INTRAVENOUS | Status: DC

## 2014-05-08 MED ORDER — HYDROMORPHONE HCL 2 MG/ML IJ SOLN
2.0000 mg | INTRAMUSCULAR | Status: DC | PRN
  Administered 2014-05-09 – 2014-05-11 (×14): 2 mg via INTRAVENOUS
  Filled 2014-05-08 (×15): qty 1

## 2014-05-08 MED ORDER — HYDROCORTISONE NA SUCCINATE PF 100 MG IJ SOLR
50.0000 mg | Freq: Three times a day (TID) | INTRAMUSCULAR | Status: DC
  Administered 2014-05-08 – 2014-05-10 (×4): 50 mg via INTRAVENOUS
  Filled 2014-05-08 (×8): qty 1

## 2014-05-08 MED ORDER — DOXYCYCLINE HYCLATE 100 MG IV SOLR
100.0000 mg | Freq: Two times a day (BID) | INTRAVENOUS | Status: DC
  Administered 2014-05-08 – 2014-05-12 (×9): 100 mg via INTRAVENOUS
  Filled 2014-05-08 (×10): qty 100

## 2014-05-08 MED ORDER — INSULIN ASPART 100 UNIT/ML ~~LOC~~ SOLN
0.0000 [IU] | SUBCUTANEOUS | Status: DC
  Administered 2014-05-09: 2 [IU] via SUBCUTANEOUS
  Administered 2014-05-09: 1 [IU] via SUBCUTANEOUS

## 2014-05-08 MED ORDER — TRAZODONE HCL 150 MG PO TABS
150.0000 mg | ORAL_TABLET | Freq: Every day | ORAL | Status: DC
  Administered 2014-05-08: 150 mg via ORAL
  Administered 2014-05-10: 200 mg via ORAL
  Administered 2014-05-11: 150 mg via ORAL
  Administered 2014-05-12 – 2014-05-13 (×2): 300 mg via ORAL
  Filled 2014-05-08 (×7): qty 2

## 2014-05-08 MED ORDER — ROPINIROLE HCL 1 MG PO TABS
1.0000 mg | ORAL_TABLET | Freq: Every day | ORAL | Status: DC
  Administered 2014-05-08 – 2014-05-13 (×6): 1 mg via ORAL
  Filled 2014-05-08 (×8): qty 1

## 2014-05-08 MED ORDER — ACETAMINOPHEN 650 MG RE SUPP: 650.0000 mg | Freq: Four times a day (QID) | RECTAL | Status: DC | PRN

## 2014-05-08 MED ORDER — HYDROCODONE-ACETAMINOPHEN 10-325 MG PO TABS
1.0000 | ORAL_TABLET | Freq: Once | ORAL | Status: AC
  Administered 2014-05-08: 1 via ORAL
  Filled 2014-05-08: qty 1

## 2014-05-08 MED ORDER — ONDANSETRON HCL 4 MG PO TABS
4.0000 mg | ORAL_TABLET | Freq: Four times a day (QID) | ORAL | Status: DC | PRN
  Filled 2014-05-08 (×4): qty 1

## 2014-05-08 MED ORDER — CLONAZEPAM 1 MG PO TABS
1.0000 mg | ORAL_TABLET | Freq: Two times a day (BID) | ORAL | Status: DC
  Administered 2014-05-08 – 2014-05-14 (×11): 1 mg via ORAL
  Filled 2014-05-08: qty 2
  Filled 2014-05-08 (×10): qty 1

## 2014-05-08 MED ORDER — FENTANYL 25 MCG/HR TD PT72
25.0000 ug | MEDICATED_PATCH | TRANSDERMAL | Status: DC
  Administered 2014-05-08 – 2014-05-11 (×2): 25 ug via TRANSDERMAL
  Filled 2014-05-08 (×2): qty 1

## 2014-05-08 MED ORDER — DEXTROSE 5 % IV SOLN
500.0000 mg | Freq: Every day | INTRAVENOUS | Status: DC
Start: 2014-05-08 — End: 2014-05-13
  Administered 2014-05-08 – 2014-05-12 (×5): 500 mg via INTRAVENOUS
  Filled 2014-05-08 (×5): qty 500

## 2014-05-08 MED ORDER — HYDROMORPHONE HCL 2 MG PO TABS
4.0000 mg | ORAL_TABLET | Freq: Once | ORAL | Status: AC
  Administered 2014-05-08: 4 mg via ORAL
  Filled 2014-05-08: qty 2

## 2014-05-08 MED ORDER — ONDANSETRON HCL 4 MG/2ML IJ SOLN
4.0000 mg | Freq: Four times a day (QID) | INTRAMUSCULAR | Status: DC | PRN
  Administered 2014-05-12 – 2014-05-14 (×5): 4 mg via INTRAVENOUS
  Filled 2014-05-08 (×5): qty 2

## 2014-05-08 MED ORDER — ACETAMINOPHEN 325 MG PO TABS: 650.0000 mg | ORAL_TABLET | Freq: Four times a day (QID) | ORAL | Status: DC | PRN

## 2014-05-08 NOTE — Consult Note (Signed)
Reason for Consult: Left thigh abscess   [name] Carr is an 59 y.o. female.  HPI: Patient is a 59 year old female with multiple medical problems including stable metastatic breast cancer, systemic lupus erythematosus, chronic pain syndrome and history of narcotic abuse. She developed a left thigh abscess last summer and in July 2015 underwent incision and drainage. At this point this appeared to be a superficial subcutaneous abscess and improved with appropriate treatment and follow-up CT scan at that time showed no evidence of deep infection. 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 She has since had follow-up with infectious disease but has had difficulty keeping surgical follow-up appointments or infectious disease follow-up appointments. She has been prescribed oral antibiotics but states she has really been unable to take them at all regularly as they upset her stomach. She has had intermittent thick cheesy type of drainage from the wound on her lateral left 5 and about 1 month ago a CT scan was ordered for further evaluation. The patient missed several appointments for the CT scan but did present yesterday and had the scan done as below which I personally reviewed. She states that about 1-2 weeks ago she developed increasing symptoms in her left thigh consisting of deep aching pain and shooting pains, swelling and erythema. She has quite a bit of pain when moving her leg. She has been thinking about going to the emergency room for a couple of days.  Past Medical History  Diagnosis Date  . PERSONALITY DISORDER   . Chronic pain syndrome     chronic narcotics, dependancy hx - [facility] for pain mgmt  . MIGRAINE HEADACHE   . Lyme disease   . ALLERGIC RHINITIS   . ANEMIA-IRON DEFICIENCY   . HYPERLIPIDEMIA   . GERD   . DIABETES MELLITUS, TYPE II   . DEPRESSION   . SLE (systemic lupus erythematosus)     [facility] - chronic  pred  . Psoriasis   . Addison's disease   . Degenerative joint disease   . DDD (degenerative disc disease)   . Phlebitis after infusion     left ?calf; "S/P phenergan/demerol injection"  . Blood clot in vein 2011; 02/04/11    "right jugular vein;left jugular vein"  . SEIZURE DISORDER     "lots of seizures; due to lupus"  . Anxiety   . Neuropathy     hands, feet, due to diabetes & back problem  . Surgical wound infection 02/15/2011    R shoulder and mastectomy site - related to PICC  . CARCINOMA, BREAST 08/2009 dx    R breast,  mets to liver - resolution s/p chemo, s/p B mastect  . Metastases to the liver   . RA (rheumatoid arthritis)   . Hypothyroidism pt states she has never had hy pothyroidism  . Hypertension     Past Surgical History  Procedure Laterality Date  . Liver bopsy  09/2009  . Port a cath placement  09/2009; 11/19/10  . Port-a-cath removal  04/2010    "due to staph & strept"  . Port-a-cath removal  12/23/2010    Procedure: REMOVAL PORT-A-CATH;  Surgeon: Pedro Earls, MD;  Location: [facility];  Service: General;  Laterality: Left;  . Breast surgery  09/2009    right breast biopsy  . Mastectomy  11/19/10    bilateral mastectomy   . I&d extremity  ~ 2010    right hand; S/P "dog bite"  .  Peripherally inserted central catheter insertion    . Spider bite    . Incision and drainage abscess Left 12/22/2013    Procedure: INCISION AND DRAINAGE ABSCESS OF LEFT THIGH;  Surgeon: Donnie Mesa, MD;  Location: [facility];  Service: General;  Laterality: Left;    Family History  Problem Relation Age of Onset  . Lung cancer Mother   . Lung cancer Father   . Arthritis Other   . Diabetes Other   . Hyperlipidemia Other     Social History:  reports that she has never smoked. She has never used smokeless tobacco. She reports that she does not drink alcohol or use illicit drugs.  Allergies:  Allergies  Allergen Reactions  . Ammonia Other (See Comments)    Ended up on ventilator  from ammonia tabs  . Contrast Media [Iodinated Diagnostic Agents] Other (See Comments)    Doesn't breath well.  . Iohexol Other (See Comments)    SOB   . Methotrexate Anaphylaxis  . Midazolam Hcl Anaphylaxis  . Nalbuphine Other (See Comments)    Can't breathe well  . Infliximab Nausea And Vomiting  . Ketorolac Tromethamine Other (See Comments)    Injectable doesn't work and pill hurts stomach.  . Morphine And Related Hives  . Nsaids Nausea And Vomiting  . Celecoxib Rash    No current facility-administered medications for this encounter.   Current Outpatient Prescriptions  Medication Sig Dispense Refill  . acetaminophen (TYLENOL) 500 MG tablet Take 1,000 mg by mouth every 6 (six) hours as needed (pain).    Marland Kitchen alum & mag hydroxide-simeth (MAALOX/MYLANTA) 200-200-20 MG/5ML suspension Take 30 mLs by mouth every 6 (six) hours as needed for indigestion or heartburn (dyspepsia). 355 mL 0  . azithromycin (ZITHROMAX) 200 MG/5ML suspension Take 12.5 mLs (500 mg total) by mouth daily. 600 mL 2  . bisacodyl (BISACODYL) 5 MG EC tablet Take 5 mg by mouth daily as needed for moderate constipation.    . clonazePAM (KLONOPIN) 1 MG tablet Take one tablet by mouth twice daily 60 tablet 5  . docusate sodium (COLACE) 100 MG capsule Take 200 mg by mouth 2 (two) times daily.     Marland Kitchen doxycycline (VIBRA-TABS) 100 MG tablet Take 1 tablet (100 mg total) by mouth 2 (two) times daily. 60 tablet 2  . DULoxetine (CYMBALTA) 60 MG capsule Take 1 capsule (60 mg total) by mouth 2 (two) times daily. 180 capsule 1  . EPINEPHrine (EPIPEN) 0.3 mg/0.3 mL IJ SOAJ injection Inject 0.3 mg into the muscle once.    . furosemide (LASIX) 20 MG tablet Take 40 mg by mouth daily.     Marland Kitchen gabapentin (NEURONTIN) 800 MG tablet Take 800 mg by mouth 3 (three) times daily.    . hydrochlorothiazide (HYDRODIURIL) 25 MG tablet Take 25 mg by mouth.    Marland Kitchen HYDROcodone-acetaminophen (NORCO) 10-325 MG per tablet Take 1 tablet by mouth every 4 (four)  hours as needed for moderate pain or severe pain. 45 tablet 0  . nystatin (MYCOSTATIN/NYSTOP) 100000 UNIT/GM POWD Apply twice a day to affected areas 30 g 3  . pantoprazole (PROTONIX) 40 MG tablet Take 40 mg by mouth daily.    . potassium chloride SA (K-DUR,KLOR-CON) 20 MEQ tablet TAKE 1 TABLET BY MOUTH TWICE DAILY 60 tablet 2  . predniSONE (DELTASONE) 5 MG tablet Take 10 mg by mouth daily with breakfast.     . promethazine (PHENERGAN) 25 MG suppository Place 25 mg rectally every 6 (six) hours as needed for  nausea or vomiting.    Marland Kitchen rOPINIRole (REQUIP) 1 MG tablet Take 1 mg by mouth at bedtime.    . senna-docusate (SENOKOT-S) 8.6-50 MG per tablet Take 1 tablet by mouth 2 (two) times daily as needed.     Marland Kitchen spironolactone (ALDACTONE) 25 MG tablet Take by mouth.    Marland Kitchen tiZANidine (ZANAFLEX) 4 MG tablet Take 4 mg by mouth every 6 (six) hours as needed for muscle spasms.    . traZODone (DESYREL) 50 MG tablet Take 50-150 mg by mouth at bedtime as needed for sleep.       Results for orders placed or performed in visit on 05/07/14 (from the past 48 hour(s))  CBC with Differential [facility])     Status: Abnormal   Collection Time: 05/07/14  2:57 PM  Result Value Ref Range   WBC 11.8 (H) 3.9 - 10.0 10e3/uL   RBC 4.69 3.70 - 5.32 10e6/uL   HGB 13.9 11.6 - 15.9 g/dL   HCT 42.7 34.8 - 46.6 %   MCV 91 81 - 101 fL   MCH 29.6 26.0 - 34.0 pg   MCHC 32.6 32.0 - 36.0 g/dL   RDW 14.3 11.1 - 15.7 %   Platelets 260 145 - 400 10e3/uL   NEUT# 10.5 (H) 1.5 - 6.5 10e3/uL   LYMPH# 1.0 0.9 - 3.3 10e3/uL   MONO# 0.4 0.1 - 0.9 10e3/uL   Eosinophils Absolute 0.0 0.0 - 0.5 10e3/uL   BASO# 0.0 0.0 - 0.2 10e3/uL   NEUT% 88.6 (H) 39.6 - 80.0 %   LYMPH% 8.1 (L) 14.0 - 48.0 %   MONO% 3.0 0.0 - 13.0 %   EOS% 0.1 0.0 - 7.0 %   BASO% 0.2 0.0 - 2.0 %  Cancer antigen 27.29     Status: None   Collection Time: 05/07/14  2:59 PM  Result Value Ref Range   CA 27.29 39 0 - 39 U/mL  Vit D  25 hydroxy (rtn osteoporosis  monitoring)     Status: Abnormal   Collection Time: 05/07/14  2:59 PM  Result Value Ref Range   Vit D, 25-Hydroxy 23 (L) 30 - 100 ng/mL    Comment: Vitamin D Status           25-OH Vitamin D       Deficiency                <20 ng/mL       Insufficiency         20 - 29 ng/mL       Optimal             > or = 30 ng/mL For 25-OH Vitamin D testing on patients on D2-supplementation andpatients for whom  quantitation of D2 and D3 fractions is required, the[employer]ureD 25-OH VIT D, (D2,D3), LC/MS/MS is recommended: order [id number] (patients > 2 yrs).   Ferritin     Status: Abnormal   Collection Time: 05/07/14  2:59 PM  Result Value Ref Range   Ferritin 336 (H) 10 - 291 ng/mL  Iron and TIBC     Status: Abnormal   Collection Time: 05/07/14  2:59 PM  Result Value Ref Range   Iron 20 (L) 42 - 145 ug/dL   UIBC 301 125 - 400 ug/dL   TIBC 321 250 - 470 ug/dL   %SAT 6 (L) 20 - 55 %  Comprehensive metabolic panel     Status: Abnormal   Collection Time: 05/07/14  2:59 PM  Result Value Ref Range   Sodium 136 135 - 145 mEq/L   Potassium 3.5 3.5 - 5.3 mEq/L   Chloride 93 (L) 96 - 112 mEq/L   CO2 29 19 - 32 mEq/L   Glucose, Bld 130 (H) 70 - 99 mg/dL   BUN 12 6 - 23 mg/dL   Creatinine, Ser 0.90 0.50 - 1.10 mg/dL   Total Bilirubin 0.7 0.2 - 1.2 mg/dL   Alkaline Phosphatase 113 39 - 117 U/L   AST 16 0 - 37 U/L   ALT 15 0 - 35 U/L   Total Protein 8.3 6.0 - 8.3 g/dL   Albumin 4.7 3.5 - 5.2 g/dL   Calcium 10.2 8.4 - 10.5 mg/dL    US Abdomen Complete  05/07/2014   CLINICAL DATA:  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 ; interval negative CT scans  EXAM: ULTRASOUND ABDOMEN COMPLETE  COMPARISON:  None.  FINDINGS: Gallbladder: The gallbladder is adequately distended. There are no stones. There is no wall thickening or pericholecystic fluid. There is no positive sonographic Murphy's sign.  Common bile duct: Diameter: 2 mm  Liver: The hepatic echotexture is normal. There is a 7 mm hyperechoic structure  in the right lobe without increased vascularity.  IVC: Partially obscured by bowel gas.  Pancreas: Evaluation the pancreas was limited by bowel gas.  Spleen: Size and appearance within normal limits.  Right Kidney: Length: 11 mm. Echogenicity within normal limits. No mass or hydronephrosis visualized.  Left Kidney: Length: 10.7 mm. Echogenicity within normal limits. No mass or hydronephrosis visualized.  Abdominal aorta: Evaluation of the abdominal aorta was limited by bowel gas. The proximal aorta exhibits a maximum of 2.4 cm in diameter.  Other findings: No ascites is demonstrated.  IMPRESSION: 1. There is a 7 mm hyperechoic focus in the right hepatic lobe. Statistically this is most compatible with a hemangioma. No abnormalities elsewhere in the liver are demonstrated. For further evaluation of the hepatic parenchyma a hepatic protocol MRI would be useful. 2. No acute abnormality is demonstrated elsewhere within the abdomen. Bowel gas limits the study somewhat.   Electronically Signed   By: David  Martinique   On: 05/07/2014 14:37   Ct Femur Left Wo Contrast  05/07/2014   CLINICAL DATA:  Previous incision and drainage of the lateral LEFT thigh. Open thigh wound, left, sequela S71.102S (ICD-10-CM)  EXAM: CT OF THE LEFT FEMUR WITHOUT CONTRAST  TECHNIQUE: Multidetector CT imaging was performed according to the standard protocol. Multiplanar CT image reconstructions were also generated.  COMPARISON:  12/25/2013.  FINDINGS: Extensive subcutaneous calcification is present, likely due to a combination of heterotopic bone, dystrophic calcification and injection granulomata. Infiltration of the subcutaneous tissues is present in the anterior lateral LEFT thigh. This is the area of previously seen open wound extending deep to some of the calcifications.  There is an ellipsoid fluid collection just deep to the vastus lateralis muscular fascia. Small locules of gas are present in this location. When measured craniocaudal,  this is 8.2 cm. On axial image 98, the collection measures 18 mm transverse by 5 cm AP. No subcutaneous fluid collection is identified. Gas tracks around to the rectus femoris muscle, consistent with extension of the abscess through the anterior compartment.  Phlegmon is present in the soft tissues, likely representing cellulitis although nonspecific. Edema and postoperative changes can produce this appearance as well. The visceral pelvis appears within normal limits. Mild symmetric atrophy of the anterior muscular compartment is present bilaterally. The RIGHT thigh  is partially visible.  IMPRESSION: 1. Recurrent deep soft tissue abscess in the vastus lateralis and rectus femoris, just deep to the superficial muscular fascia. On this noncontrast study, the abscess is demarcated by fluid and gas within the anterior lateral musculature of the LEFT thigh. 2. No subcutaneous abscess. Infiltration of the subcutaneous tissues may represent scarring or more likely cellulitis in the setting of deep abscess. 3. Dystrophic calcifications throughout the subcutaneous tissues. Potentially, these could serve as a nidus for recurrent infection.   Electronically Signed   By: Dereck Ligas M.D.   On: 05/07/2014 15:17    Review of Systems  Constitutional: Positive for malaise/fatigue. Negative for fever and chills.  Respiratory: Negative.   Cardiovascular: Negative.   Gastrointestinal: Negative.   Musculoskeletal: Positive for myalgias, back pain and joint pain.   Blood pressure 148/72, pulse 66, temperature 98.3 F (36.8 C), temperature source Oral, resp. rate 18, SpO2 96 %. Physical Exam General: Alert, Mildly obese Caucasian female who appears somewhat chronically ill, in no distress Skin: Warm and dry without rash. HEENT: No palpable masses or thyromegaly. Sclera nonicteric. Pupils equal round and reactive. Oropharynx clear. Lymph nodes: No cervical, supraclavicular, or inguinal nodes palpable. Chest wall:  Status post bilateral mastectomy with no skin changes or masses to suggest recurrence Lungs: Breath sounds clear and equal without increased work of breathing Cardiovascular: Regular rate and rhythm without murmur. No JVD or edema. Peripheral pulses intact. Abdomen: Nondistended. Soft and nontender. No masses palpable. No organomegaly. No palpable hernias. Extremities: There is a wound in the anterior lateral left thigh with a small amount of granulation tissue and with pressure some thick purulent drainage. There is moderate swelling of the anterior and lateral thigh extensively with erythema and some tenderness.  Her leg appears well perfused. Neurologic: Alert and fully oriented. No gross motor deficits. Affect and thoughts contents appear normal. . 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 This has obviously progressed clinically and CT scan now shows a deep abscess involving the anterior muscle compartment of the left thigh. I believe this will require fairly extensive incision and drainage and as this is deep into the thigh musculature I feel that the patient will require an orthopedic consult as this is beyond the scope of what we would generally approach in general surgery.  Gram Siedlecki T 05/08/2014, 6:44 PM

## 2014-05-08 NOTE — ED Notes (Signed)
Pt presents with NAD. Pt has CT scan yesterday. Pt called for CT results today, Informed that results were severe infection with tissue involvement questionable bone involvement. Pt was informed to come to [facility] for further evaluation. Pt was informed Dr. Lynnda Child meet her here. Pt has wound to left thigh  (I&D from cellulitis to left femur at [facility] in Nov.) Pt is with Infectious disease

## 2014-05-08 NOTE — H&P (Signed)
[facility] History and Physical  [name] Carr QPY:[id number] DOB: 1955-06-07 DOA: 05/08/2014  Referring physician: ER physician. PCP: [name], MD   Chief Complaint: Left thigh pain and swelling.  HPI: [name] Carr is a 59 y.o. female with known history of infection of the left thigh area status post incision and drainage last year November was advised to come to the ER after patient had a CT of the left thigh done yesterday which showed worsening of patient's abscess with extension into the deeper planes. Patient last year had incision and drainage of the abscess following which cultures grew mycobacteria and patient was initially placed on IV antibiotics for which changed to oral antibiotics. Patient was not able to take the oral antibiotics reliably as patient had nausea and vomiting. Patient also had difficulty following up with surgery and yesterday patient had a CT left thigh ordered by surgeon and was found to have worsening abscesses and was advised to come to the ER. In the ER patient complains of increasing pain with minimal movement. Denies any fever chills. On-call surgeon Dr. Excell Seltzer has already seen the patient and at this time to have recommended orthopedic consult.   Review of Systems: As presented in the history of presenting illness, rest negative.  Past Medical History  Diagnosis Date  . PERSONALITY DISORDER   . Chronic pain syndrome     chronic narcotics, dependancy hx - [place] for pain mgmt  . MIGRAINE HEADACHE   . Lyme disease   . ALLERGIC RHINITIS   . ANEMIA-IRON DEFICIENCY   . HYPERLIPIDEMIA   . GERD   . DIABETES MELLITUS, TYPE II   . DEPRESSION   . SLE (systemic lupus erythematosus)     [facility] - chronic pred  . Psoriasis   . Addison's disease   . Degenerative joint disease   . DDD (degenerative disc disease)   . Phlebitis after infusion     left ?calf; "S/P phenergan/demerol injection"  . Blood clot in vein 2011; 02/04/11     "right jugular vein;left jugular vein"  . SEIZURE DISORDER     "lots of seizures; due to lupus"  . Anxiety   . Neuropathy     hands, feet, due to diabetes & back problem  . Surgical wound infection 02/15/2011    R shoulder and mastectomy site - related to PICC  . CARCINOMA, BREAST 08/2009 dx    R breast,  mets to liver - resolution s/p chemo, s/p B mastect  . Metastases to the liver   . RA (rheumatoid arthritis)   . Hypothyroidism pt states she has never had hy pothyroidism  . Hypertension    Past Surgical History  Procedure Laterality Date  . Liver bopsy  09/2009  . Port a cath placement  09/2009; 11/19/10  . Port-a-cath removal  04/2010    "due to staph & strept"  . Port-a-cath removal  12/23/2010    Procedure: REMOVAL PORT-A-CATH;  Surgeon: Pedro Earls, MD;  Location: [facility];  Service: General;  Laterality: Left;  . Breast surgery  09/2009    right breast biopsy  . Mastectomy  11/19/10    bilateral mastectomy   . I&d extremity  ~ 2010    right hand; S/P "dog bite"  . Peripherally inserted central catheter insertion    . Spider bite    . Incision and drainage abscess Left 12/22/2013    Procedure: INCISION AND DRAINAGE ABSCESS OF LEFT THIGH;  Surgeon: Donnie Mesa, MD;  Location: [facility] OR;  Service: General;  Laterality: Left;   Social History:  reports that she has never smoked. She has never used smokeless tobacco. She reports that she does not drink alcohol or use illicit drugs. Where does patient live home. Can patient participate in ADLs? Yes.  Allergies  Allergen Reactions  . Ammonia Other (See Comments)    Ended up on ventilator from ammonia tabs  . Contrast Media [Iodinated Diagnostic Agents] Other (See Comments)    Doesn't breath well.  . Iohexol Other (See Comments)    SOB   . Methotrexate Anaphylaxis  . Midazolam Hcl Anaphylaxis  . Nalbuphine Other (See Comments)    Can't breathe well  . Infliximab Nausea And Vomiting  . Ketorolac Tromethamine Other  (See Comments)    Injectable doesn't work and pill hurts stomach.  . Morphine And Related Hives  . Nsaids Nausea And Vomiting  . Celecoxib Rash    Family History:  Family History  Problem Relation Age of Onset  . Lung cancer Mother   . Lung cancer Father   . Arthritis Other   . Diabetes Other   . Hyperlipidemia Other       Prior to Admission medications   Medication Sig Start Date End Date Taking? Authorizing Provider  acetaminophen (TYLENOL) 500 MG tablet Take 1,000 mg by mouth every 6 (six) hours as needed (pain).   Yes Historical Provider, MD  alum & mag hydroxide-simeth (MAALOX/MYLANTA) 200-200-20 MG/5ML suspension Take 30 mLs by mouth every 6 (six) hours as needed for indigestion or heartburn (dyspepsia). 08/27/13  Yes Debbe Odea, MD  azithromycin (ZITHROMAX) 200 MG/5ML suspension Take 12.5 mLs (500 mg total) by mouth daily. 03/25/14  Yes Thayer Headings, MD  clonazePAM (KLONOPIN) 1 MG tablet Take one tablet by mouth twice daily Patient taking differently: Take 1 mg by mouth 2 (two) times daily.  08/28/13  Yes Estill Dooms, MD  docusate sodium (COLACE) 100 MG capsule Take 200 mg by mouth 2 (two) times daily.    Yes Historical Provider, MD  doxycycline (VIBRA-TABS) 100 MG tablet Take 1 tablet (100 mg total) by mouth 2 (two) times daily. 03/25/14  Yes Thayer Headings, MD  DULoxetine (CYMBALTA) 60 MG capsule Take 1 capsule (60 mg total) by mouth 2 (two) times daily. 06/11/12  Yes Rowe Clack, MD  EPINEPHrine (EPIPEN) 0.3 mg/0.3 mL IJ SOAJ injection Inject 0.3 mg into the muscle once.   Yes Historical Provider, MD  fentaNYL (DURAGESIC - DOSED MCG/HR) 25 MCG/HR patch Place 25 mcg onto the skin every 3 (three) days.   Yes Historical Provider, MD  furosemide (LASIX) 20 MG tablet Take 40 mg by mouth daily.    Yes Historical Provider, MD  gabapentin (NEURONTIN) 800 MG tablet Take 800 mg by mouth 3 (three) times daily.   Yes Historical Provider, MD  hydrochlorothiazide (HYDRODIURIL) 25  MG tablet Take 25 mg by mouth.   Yes Historical Provider, MD  HYDROcodone-acetaminophen (NORCO) 10-325 MG per tablet Take 1 tablet by mouth every 4 (four) hours as needed for moderate pain or severe pain. 12/27/13  Yes Ripudeep Krystal Eaton, MD  nystatin (MYCOSTATIN/NYSTOP) 100000 UNIT/GM POWD Apply twice a day to affected areas 12/27/13  Yes Ripudeep K Rai, MD  pantoprazole (PROTONIX) 40 MG tablet Take 40 mg by mouth daily.   Yes Historical Provider, MD  potassium chloride SA (K-DUR,KLOR-CON) 20 MEQ tablet TAKE 1 TABLET BY MOUTH TWICE DAILY Patient taking differently: 20 MEQ daily 04/08/14  Yes  Blanchie Serve, MD  predniSONE (DELTASONE) 5 MG tablet Take 10 mg by mouth daily with breakfast.    Yes Historical Provider, MD  PredniSONE (RAYOS PO) Take 1 tablet by mouth at bedtime.   Yes Historical Provider, MD  PRESCRIPTION MEDICATION Supportive therapy [place]   Yes Historical Provider, MD  promethazine (PHENERGAN) 25 MG suppository Place 25 mg rectally every 6 (six) hours as needed for nausea or vomiting.   Yes Historical Provider, MD  rOPINIRole (REQUIP) 1 MG tablet Take 1 mg by mouth at bedtime.   Yes Historical Provider, MD  senna-docusate (SENOKOT-S) 8.6-50 MG per tablet Take 1 tablet by mouth 2 (two) times daily as needed for mild constipation.    Yes Historical Provider, MD  spironolactone (ALDACTONE) 25 MG tablet Take 25 mg by mouth every morning.  02/21/07  Yes Historical Provider, MD  tiZANidine (ZANAFLEX) 4 MG tablet Take 4 mg by mouth every 6 (six) hours as needed for muscle spasms.   Yes Historical Provider, MD  traZODone (DESYREL) 150 MG tablet Take 150-300 mg by mouth at bedtime.   Yes Historical Provider, MD    Physical Exam: Filed Vitals:   05/08/14 1810 05/08/14 1933 05/08/14 2138  BP: 148/72 129/62 113/58  Pulse: 66 70 74  Temp: 98.3 F (36.8 C)    TempSrc: Oral    Resp: 18 18 18   SpO2: 96% 98% 95%     General:  Well-developed and nourished.  Eyes: Anicteric no pallor.  ENT: No  discharge from the ears eyes nose or mouth.  Neck: No mass felt.  Cardiovascular: S1 and S2 heard.  Respiratory: No rhonchi or crepitations.  Abdomen: Soft nontender bowel sounds present.  Skin: Mild erythema of the medial thigh on the left side. That is an incision area with mild discharge on the lateral aspect of the thigh.  Musculoskeletal: Swelling and erythema of the left thigh mostly on the anterior and medial aspect.  Psychiatric: Appears normal.  Neurologic: Alert awake oriented to time place and person. Moves all extremities.  Labs on Admission:  Basic Metabolic Panel:  Recent Labs Lab 05/07/14 1459 05/08/14 1958  NA 136 137  K 3.5 3.1*  CL 93* 96  CO2 29 30  GLUCOSE 130* 105*  BUN 12 14  CREATININE 0.90 0.88  CALCIUM 10.2 9.1   Liver Function Tests:  Recent Labs Lab 05/07/14 1459 05/08/14 1958  AST 16 19  ALT 15 18  ALKPHOS 113 97  BILITOT 0.7 0.3  PROT 8.3 7.7  ALBUMIN 4.7 4.1   No results for input(s): LIPASE, AMYLASE in the last 168 hours. No results for input(s): AMMONIA in the last 168 hours. CBC:  Recent Labs Lab 05/07/14 1457 05/08/14 1958  WBC 11.8* 11.4*  NEUTROABS 10.5* 9.1*  HGB 13.9 11.8*  HCT 42.7 36.2  MCV 91 91.9  PLT 260 248   Cardiac Enzymes: No results for input(s): CKTOTAL, CKMB, CKMBINDEX, TROPONINI in the last 168 hours.  BNP (last 3 results) No results for input(s): BNP in the last 8760 hours.  ProBNP (last 3 results) No results for input(s): PROBNP in the last 8760 hours.  CBG: No results for input(s): GLUCAP in the last 168 hours.  Radiological Exams on Admission: US Abdomen Complete  05/07/2014   CLINICAL DATA:  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 ; interval negative CT scans  EXAM: ULTRASOUND ABDOMEN COMPLETE  COMPARISON:  None.  FINDINGS: Gallbladder: The gallbladder is adequately distended. There are no stones. There is no  wall thickening or pericholecystic fluid. There is no positive  sonographic Murphy's sign.  Common bile duct: Diameter: 2 mm  Liver: The hepatic echotexture is normal. There is a 7 mm hyperechoic structure in the right lobe without increased vascularity.  IVC: Partially obscured by bowel gas.  Pancreas: Evaluation the pancreas was limited by bowel gas.  Spleen: Size and appearance within normal limits.  Right Kidney: Length: 11 mm. Echogenicity within normal limits. No mass or hydronephrosis visualized.  Left Kidney: Length: 10.7 mm. Echogenicity within normal limits. No mass or hydronephrosis visualized.  Abdominal aorta: Evaluation of the abdominal aorta was limited by bowel gas. The proximal aorta exhibits a maximum of 2.4 cm in diameter.  Other findings: No ascites is demonstrated.  IMPRESSION: 1. There is a 7 mm hyperechoic focus in the right hepatic lobe. Statistically this is most compatible with a hemangioma. No abnormalities elsewhere in the liver are demonstrated. For further evaluation of the hepatic parenchyma a hepatic protocol MRI would be useful. 2. No acute abnormality is demonstrated elsewhere within the abdomen. Bowel gas limits the study somewhat.   Electronically Signed   By: David  Martinique   On: 05/07/2014 14:37   Ct Femur Left Wo Contrast  05/07/2014   CLINICAL DATA:  Previous incision and drainage of the lateral LEFT thigh. Open thigh wound, left, sequela S71.102S (ICD-10-CM)  EXAM: CT OF THE LEFT FEMUR WITHOUT CONTRAST  TECHNIQUE: Multidetector CT imaging was performed according to the standard protocol. Multiplanar CT image reconstructions were also generated.  COMPARISON:  12/25/2013.  FINDINGS: Extensive subcutaneous calcification is present, likely due to a combination of heterotopic bone, dystrophic calcification and injection granulomata. Infiltration of the subcutaneous tissues is present in the anterior lateral LEFT thigh. This is the area of previously seen open wound extending deep to some of the calcifications.  There is an ellipsoid fluid  collection just deep to the vastus lateralis muscular fascia. Small locules of gas are present in this location. When measured craniocaudal, this is 8.2 cm. On axial image 98, the collection measures 18 mm transverse by 5 cm AP. No subcutaneous fluid collection is identified. Gas tracks around to the rectus femoris muscle, consistent with extension of the abscess through the anterior compartment.  Phlegmon is present in the soft tissues, likely representing cellulitis although nonspecific. Edema and postoperative changes can produce this appearance as well. The visceral pelvis appears within normal limits. Mild symmetric atrophy of the anterior muscular compartment is present bilaterally. The RIGHT thigh is partially visible.  IMPRESSION: 1. Recurrent deep soft tissue abscess in the vastus lateralis and rectus femoris, just deep to the superficial muscular fascia. On this noncontrast study, the abscess is demarcated by fluid and gas within the anterior lateral musculature of the LEFT thigh. 2. No subcutaneous abscess. Infiltration of the subcutaneous tissues may represent scarring or more likely cellulitis in the setting of deep abscess. 3. Dystrophic calcifications throughout the subcutaneous tissues. Potentially, these could serve as a nidus for recurrent infection.   Electronically Signed   By: Dereck Ligas M.D.   On: 05/07/2014 15:17     Assessment/Plan Principal Problem:   Abscess of left thigh Active Problems:   Chronic pain syndrome   SLE-on prednisone-Follow at [facility]   Diabetes mellitus type 2, controlled   1. Abscess of the left thigh - appreciate surgery consult. I have discussed with on-call orthopedic surgeon Dr. Marlou Sa who has advised to keep patient nothing by mouth. I have continued patient on doxycycline and Zithromax  as IV which patient was on. Will need infectious disease consult in a.m. for further recommendations on antibiotics.  2. SLE on prednisone - anticipating surgery I have  placed patient on stress dose steroids for now which can be changed to oral prednisone after surgery. 3. History of diabetes mellitus type 2 - present on no medications. Closely follow CBGs and sliding scale coverage. 4. Chronic pain syndrome.   DVT Prophylaxis SCDs in anticipation of surgery.  Code Status: Full code.  Family Communication: None.  Disposition Plan: Admit to inpatient.    Delonta Yohannes N. [facility] Pager [phone].  If 7PM-7AM, please contact night-coverage [email or web address] Password [email or web address] 05/08/2014, 10:50 PM

## 2014-05-08 NOTE — ED Provider Notes (Addendum)
CSN: [id number]     Arrival date & time 05/08/14  1703 History   First MD Initiated Contact with Patient 05/08/14 1836     Chief Complaint  Patient presents with  . Cellulitis   Chief complaint left thigh pain  (Consider location/radiation/quality/duration/timing/severity/associated sxs/prior Treatment) HPI Patient complains of left thigh pain for approximately the past one week. Pain is worse with weightbearing. She is been evaluated with CT scan of the thigh yesterday which showed deep soft tissue abscess. Evaluated by Dr. Excell Seltzer today who requested admission to medical service and orthopedic consultation incision and drainage, infectious disease consultation for appropriate antibiotics. She admits to vomiting one time yesterday. No nausea at present. Past Medical History  Diagnosis Date  . PERSONALITY DISORDER   . Chronic pain syndrome     chronic narcotics, dependancy hx - [facility] for pain mgmt  . MIGRAINE HEADACHE   . Lyme disease   . ALLERGIC RHINITIS   . ANEMIA-IRON DEFICIENCY   . HYPERLIPIDEMIA   . GERD   . DIABETES MELLITUS, TYPE II   . DEPRESSION   . SLE (systemic lupus erythematosus)     [facility] - chronic pred  . Psoriasis   . Addison's disease   . Degenerative joint disease   . DDD (degenerative disc disease)   . Phlebitis after infusion     left ?calf; "S/P phenergan/demerol injection"  . Blood clot in vein 2011; 02/04/11    "right jugular vein;left jugular vein"  . SEIZURE DISORDER     "lots of seizures; due to lupus"  . Anxiety   . Neuropathy     hands, feet, due to diabetes & back problem  . Surgical wound infection 02/15/2011    R shoulder and mastectomy site - related to PICC  . CARCINOMA, BREAST 08/2009 dx    R breast,  mets to liver - resolution s/p chemo, s/p B mastect  . Metastases to the liver   . RA (rheumatoid arthritis)   . Hypothyroidism pt states she has never had hy pothyroidism  . Hypertension    Past Surgical History   Procedure Laterality Date  . Liver bopsy  09/2009  . Port a cath placement  09/2009; 11/19/10  . Port-a-cath removal  04/2010    "due to staph & strept"  . Port-a-cath removal  12/23/2010    Procedure: REMOVAL PORT-A-CATH;  Surgeon: Pedro Earls, MD;  Location: [facility];  Service: General;  Laterality: Left;  . Breast surgery  09/2009    right breast biopsy  . Mastectomy  11/19/10    bilateral mastectomy   . I&d extremity  ~ 2010    right hand; S/P "dog bite"  . Peripherally inserted central catheter insertion    . Spider bite    . Incision and drainage abscess Left 12/22/2013    Procedure: INCISION AND DRAINAGE ABSCESS OF LEFT THIGH;  Surgeon: Donnie Mesa, MD;  Location: [facility];  Service: General;  Laterality: Left;   Family History  Problem Relation Age of Onset  . Lung cancer Mother   . Lung cancer Father   . Arthritis Other   . Diabetes Other   . Hyperlipidemia Other    History  Substance Use Topics  . Smoking status: Never Smoker   . Smokeless tobacco: Never Used     Comment: Never used tobacco  . Alcohol Use: No   OB History    No data available     Review of Systems  Constitutional: Negative.  HENT: Negative.   Respiratory: Negative.   Cardiovascular: Negative.   Gastrointestinal: Positive for vomiting.       Vomiting yesterday  Musculoskeletal: Positive for myalgias, back pain and gait problem.       Chronic low back pain, left thigh pain. Walks with cane  Skin: Negative.   Allergic/Immunologic: Positive for immunocompromised state.  Neurological: Negative.   Psychiatric/Behavioral: Negative.   All other systems reviewed and are negative.   2125 pain not improved . Additional hydromorphone ordered  Allergies  Ammonia; Contrast media; Iohexol; Methotrexate; Midazolam hcl; Nalbuphine; Infliximab; Ketorolac tromethamine; Morphine and related; Nsaids; and Celecoxib  Home Medications   Prior to Admission medications   Medication Sig Start Date End  Date Taking? Authorizing Provider  acetaminophen (TYLENOL) 500 MG tablet Take 1,000 mg by mouth every 6 (six) hours as needed (pain).    Historical Provider, MD  alum & mag hydroxide-simeth (MAALOX/MYLANTA) 200-200-20 MG/5ML suspension Take 30 mLs by mouth every 6 (six) hours as needed for indigestion or heartburn (dyspepsia). 08/27/13   Debbe Odea, MD  azithromycin (ZITHROMAX) 200 MG/5ML suspension Take 12.5 mLs (500 mg total) by mouth daily. 03/25/14   Thayer Headings, MD  bisacodyl (BISACODYL) 5 MG EC tablet Take 5 mg by mouth daily as needed for moderate constipation.    Historical Provider, MD  clonazePAM Bobbye Charleston) 1 MG tablet Take one tablet by mouth twice daily 08/28/13   Estill Dooms, MD  docusate sodium (COLACE) 100 MG capsule Take 200 mg by mouth 2 (two) times daily.     Historical Provider, MD  doxycycline (VIBRA-TABS) 100 MG tablet Take 1 tablet (100 mg total) by mouth 2 (two) times daily. 03/25/14   Thayer Headings, MD  DULoxetine (CYMBALTA) 60 MG capsule Take 1 capsule (60 mg total) by mouth 2 (two) times daily. 06/11/12   Rowe Clack, MD  EPINEPHrine (EPIPEN) 0.3 mg/0.3 mL IJ SOAJ injection Inject 0.3 mg into the muscle once.    Historical Provider, MD  furosemide (LASIX) 20 MG tablet Take 40 mg by mouth daily.     Historical Provider, MD  gabapentin (NEURONTIN) 800 MG tablet Take 800 mg by mouth 3 (three) times daily.    Historical Provider, MD  hydrochlorothiazide (HYDRODIURIL) 25 MG tablet Take 25 mg by mouth.    Historical Provider, MD  HYDROcodone-acetaminophen (NORCO) 10-325 MG per tablet Take 1 tablet by mouth every 4 (four) hours as needed for moderate pain or severe pain. 12/27/13   Ripudeep Krystal Eaton, MD  nystatin (MYCOSTATIN/NYSTOP) 100000 UNIT/GM POWD Apply twice a day to affected areas 12/27/13   Ripudeep K Rai, MD  pantoprazole (PROTONIX) 40 MG tablet Take 40 mg by mouth daily.    Historical Provider, MD  potassium chloride SA (K-DUR,KLOR-CON) 20 MEQ tablet TAKE 1 TABLET  BY MOUTH TWICE DAILY 04/08/14   Blanchie Serve, MD  predniSONE (DELTASONE) 5 MG tablet Take 10 mg by mouth daily with breakfast.     Historical Provider, MD  promethazine (PHENERGAN) 25 MG suppository Place 25 mg rectally every 6 (six) hours as needed for nausea or vomiting.    Historical Provider, MD  rOPINIRole (REQUIP) 1 MG tablet Take 1 mg by mouth at bedtime.    Historical Provider, MD  senna-docusate (SENOKOT-S) 8.6-50 MG per tablet Take 1 tablet by mouth 2 (two) times daily as needed.     Historical Provider, MD  spironolactone (ALDACTONE) 25 MG tablet Take by mouth. 02/21/07   Historical Provider, MD  tiZANidine (ZANAFLEX)  4 MG tablet Take 4 mg by mouth every 6 (six) hours as needed for muscle spasms.    Historical Provider, MD  traZODone (DESYREL) 50 MG tablet Take 50-150 mg by mouth at bedtime as needed for sleep.    Historical Provider, MD   BP 129/62 mmHg  Pulse 70  Temp(Src) 98.3 F (36.8 C) (Oral)  Resp 18  SpO2 98% Physical Exam  Constitutional: She is oriented to person, place, and time. She appears well-developed and well-nourished. No distress.  HENT:  Head: Normocephalic and atraumatic.  Eyes: Conjunctivae are normal. Pupils are equal, round, and reactive to light.  Neck: Neck supple. No tracheal deviation present. No thyromegaly present.  Cardiovascular: Normal rate and regular rhythm.   No murmur heard. Pulmonary/Chest: Effort normal and breath sounds normal.  Abdominal: Soft. Bowel sounds are normal. She exhibits no distension. There is no tenderness.  Musculoskeletal: Normal range of motion. She exhibits no edema or tenderness.  Left lower extremity with swelling tenderness along lateral thigh with 1 cm open area. No fluctuance. DP pulse 2+. All other extremities without redness swelling or tenderness neurovascular intact  Neurological: She is alert and oriented to person, place, and time. No cranial nerve deficit. Coordination normal.  Skin: Skin is warm and dry. No  rash noted.  Psychiatric: She has a normal mood and affect.  Nursing note and vitals reviewed.   ED Course  Procedures (including critical care time) Labs Review Labs Reviewed - No data to display  Imaging Review US Abdomen Complete  05/07/2014   CLINICAL DATA:  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 ; interval negative CT scans  EXAM: ULTRASOUND ABDOMEN COMPLETE  COMPARISON:  None.  FINDINGS: Gallbladder: The gallbladder is adequately distended. There are no stones. There is no wall thickening or pericholecystic fluid. There is no positive sonographic Murphy's sign.  Common bile duct: Diameter: 2 mm  Liver: The hepatic echotexture is normal. There is a 7 mm hyperechoic structure in the right lobe without increased vascularity.  IVC: Partially obscured by bowel gas.  Pancreas: Evaluation the pancreas was limited by bowel gas.  Spleen: Size and appearance within normal limits.  Right Kidney: Length: 11 mm. Echogenicity within normal limits. No mass or hydronephrosis visualized.  Left Kidney: Length: 10.7 mm. Echogenicity within normal limits. No mass or hydronephrosis visualized.  Abdominal aorta: Evaluation of the abdominal aorta was limited by bowel gas. The proximal aorta exhibits a maximum of 2.4 cm in diameter.  Other findings: No ascites is demonstrated.  IMPRESSION: 1. There is a 7 mm hyperechoic focus in the right hepatic lobe. Statistically this is most compatible with a hemangioma. No abnormalities elsewhere in the liver are demonstrated. For further evaluation of the hepatic parenchyma a hepatic protocol MRI would be useful. 2. No acute abnormality is demonstrated elsewhere within the abdomen. Bowel gas limits the study somewhat.   Electronically Signed   By: David  Martinique   On: 05/07/2014 14:37   Ct Femur Left Wo Contrast  05/07/2014   CLINICAL DATA:  Previous incision and drainage of the lateral LEFT thigh. Open thigh wound, left, sequela S71.102S (ICD-10-CM)  EXAM: CT OF THE  LEFT FEMUR WITHOUT CONTRAST  TECHNIQUE: Multidetector CT imaging was performed according to the standard protocol. Multiplanar CT image reconstructions were also generated.  COMPARISON:  12/25/2013.  FINDINGS: Extensive subcutaneous calcification is present, likely due to a combination of heterotopic bone, dystrophic calcification and injection granulomata. Infiltration of the subcutaneous tissues is present in the anterior  lateral LEFT thigh. This is the area of previously seen open wound extending deep to some of the calcifications.  There is an ellipsoid fluid collection just deep to the vastus lateralis muscular fascia. Small locules of gas are present in this location. When measured craniocaudal, this is 8.2 cm. On axial image 98, the collection measures 18 mm transverse by 5 cm AP. No subcutaneous fluid collection is identified. Gas tracks around to the rectus femoris muscle, consistent with extension of the abscess through the anterior compartment.  Phlegmon is present in the soft tissues, likely representing cellulitis although nonspecific. Edema and postoperative changes can produce this appearance as well. The visceral pelvis appears within normal limits. Mild symmetric atrophy of the anterior muscular compartment is present bilaterally. The RIGHT thigh is partially visible.  IMPRESSION: 1. Recurrent deep soft tissue abscess in the vastus lateralis and rectus femoris, just deep to the superficial muscular fascia. On this noncontrast study, the abscess is demarcated by fluid and gas within the anterior lateral musculature of the LEFT thigh. 2. No subcutaneous abscess. Infiltration of the subcutaneous tissues may represent scarring or more likely cellulitis in the setting of deep abscess. 3. Dystrophic calcifications throughout the subcutaneous tissues. Potentially, these could serve as a nidus for recurrent infection.   Electronically Signed   By: Dereck Ligas M.D.   On: 05/07/2014 15:17     EKG  Interpretation None     Results for orders placed or performed during the hospital encounter of 05/08/14  Comprehensive metabolic panel  Result Value Ref Range   Sodium 137 135 - 145 mmol/L   Potassium 3.1 (L) 3.5 - 5.1 mmol/L   Chloride 96 96 - 112 mmol/L   CO2 30 19 - 32 mmol/L   Glucose, Bld 105 (H) 70 - 99 mg/dL   BUN 14 6 - 23 mg/dL   Creatinine, Ser 0.88 0.50 - 1.10 mg/dL   Calcium 9.1 8.4 - 10.5 mg/dL   Total Protein 7.7 6.0 - 8.3 g/dL   Albumin 4.1 3.5 - 5.2 g/dL   AST 19 0 - 37 U/L   ALT 18 0 - 35 U/L   Alkaline Phosphatase 97 39 - 117 U/L   Total Bilirubin 0.3 0.3 - 1.2 mg/dL   GFR calc non Af Amer 71 (L) >90 mL/min   GFR calc Af Amer 82 (L) >90 mL/min   Anion gap 11 5 - 15  CBC with Differential/Platelet  Result Value Ref Range   WBC 11.4 (H) 4.0 - 10.5 K/uL   RBC 3.94 3.87 - 5.11 MIL/uL   Hemoglobin 11.8 (L) 12.0 - 15.0 g/dL   HCT 36.2 36.0 - 46.0 %   MCV 91.9 78.0 - 100.0 fL   MCH 29.9 26.0 - 34.0 pg   MCHC 32.6 30.0 - 36.0 g/dL   RDW 14.1 11.5 - 15.5 %   Platelets 248 150 - 400 K/uL   Neutrophils Relative % 81 (H) 43 - 77 %   Neutro Abs 9.1 (H) 1.7 - 7.7 K/uL   Lymphocytes Relative 14 12 - 46 %   Lymphs Abs 1.6 0.7 - 4.0 K/uL   Monocytes Relative 5 3 - 12 %   Monocytes Absolute 0.6 0.1 - 1.0 K/uL   Eosinophils Relative 0 0 - 5 %   Eosinophils Absolute 0.0 0.0 - 0.7 K/uL   Basophils Relative 0 0 - 1 %   Basophils Absolute 0.0 0.0 - 0.1 K/uL   US Abdomen Complete  05/07/2014   CLINICAL  DATA:  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 ; interval negative CT scans  EXAM: ULTRASOUND ABDOMEN COMPLETE  COMPARISON:  None.  FINDINGS: Gallbladder: The gallbladder is adequately distended. There are no stones. There is no wall thickening or pericholecystic fluid. There is no positive sonographic Murphy's sign.  Common bile duct: Diameter: 2 mm  Liver: The hepatic echotexture is normal. There is a 7 mm hyperechoic structure in the right lobe without increased  vascularity.  IVC: Partially obscured by bowel gas.  Pancreas: Evaluation the pancreas was limited by bowel gas.  Spleen: Size and appearance within normal limits.  Right Kidney: Length: 11 mm. Echogenicity within normal limits. No mass or hydronephrosis visualized.  Left Kidney: Length: 10.7 mm. Echogenicity within normal limits. No mass or hydronephrosis visualized.  Abdominal aorta: Evaluation of the abdominal aorta was limited by bowel gas. The proximal aorta exhibits a maximum of 2.4 cm in diameter.  Other findings: No ascites is demonstrated.  IMPRESSION: 1. There is a 7 mm hyperechoic focus in the right hepatic lobe. Statistically this is most compatible with a hemangioma. No abnormalities elsewhere in the liver are demonstrated. For further evaluation of the hepatic parenchyma a hepatic protocol MRI would be useful. 2. No acute abnormality is demonstrated elsewhere within the abdomen. Bowel gas limits the study somewhat.   Electronically Signed   By: David  Martinique   On: 05/07/2014 14:37   Ct Femur Left Wo Contrast  05/07/2014   CLINICAL DATA:  Previous incision and drainage of the lateral LEFT thigh. Open thigh wound, left, sequela S71.102S (ICD-10-CM)  EXAM: CT OF THE LEFT FEMUR WITHOUT CONTRAST  TECHNIQUE: Multidetector CT imaging was performed according to the standard protocol. Multiplanar CT image reconstructions were also generated.  COMPARISON:  12/25/2013.  FINDINGS: Extensive subcutaneous calcification is present, likely due to a combination of heterotopic bone, dystrophic calcification and injection granulomata. Infiltration of the subcutaneous tissues is present in the anterior lateral LEFT thigh. This is the area of previously seen open wound extending deep to some of the calcifications.  There is an ellipsoid fluid collection just deep to the vastus lateralis muscular fascia. Small locules of gas are present in this location. When measured craniocaudal, this is 8.2 cm. On axial image 98, the  collection measures 18 mm transverse by 5 cm AP. No subcutaneous fluid collection is identified. Gas tracks around to the rectus femoris muscle, consistent with extension of the abscess through the anterior compartment.  Phlegmon is present in the soft tissues, likely representing cellulitis although nonspecific. Edema and postoperative changes can produce this appearance as well. The visceral pelvis appears within normal limits. Mild symmetric atrophy of the anterior muscular compartment is present bilaterally. The RIGHT thigh is partially visible.  IMPRESSION: 1. Recurrent deep soft tissue abscess in the vastus lateralis and rectus femoris, just deep to the superficial muscular fascia. On this noncontrast study, the abscess is demarcated by fluid and gas within the anterior lateral musculature of the LEFT thigh. 2. No subcutaneous abscess. Infiltration of the subcutaneous tissues may represent scarring or more likely cellulitis in the setting of deep abscess. 3. Dystrophic calcifications throughout the subcutaneous tissues. Potentially, these could serve as a nidus for recurrent infection.   Electronically Signed   By: Dereck Ligas M.D.   On: 05/07/2014 15:17    MDM  Dr. Excell Seltzer states patient does not need emergent intravenous antibiotics as abscess is long-standing. She does not appear acutely ill. Patient will need a PICC line Spoke with  Dr.Kakrakandy  Plan admit medical surgical floor Dx is abscess of left thigh Final diagnoses:  None        Orlie Dakin, MD 05/08/14 2135  Orlie Dakin, MD 05/08/14 2137

## 2014-05-08 NOTE — Telephone Encounter (Signed)
Mailed sept schedule °

## 2014-05-08 NOTE — ED Notes (Signed)
MD at bedside. HOXWORTH PRESENT TO SPEAK WITH THIS PT

## 2014-05-09 ENCOUNTER — Inpatient Hospital Stay (HOSPITAL_COMMUNITY): Payer: Medicare Other | Admitting: Anesthesiology

## 2014-05-09 ENCOUNTER — Encounter (HOSPITAL_COMMUNITY)
Admission: EM | Disposition: A | Discharge status: Home Health | Payer: Self-pay | Source: Home / Self Care | Attending: Internal Medicine

## 2014-05-09 ENCOUNTER — Encounter (HOSPITAL_COMMUNITY): Payer: Self-pay | Admitting: Anesthesiology

## 2014-05-09 DIAGNOSIS — A319 Mycobacterial infection, unspecified: Secondary | ICD-10-CM

## 2014-05-09 DIAGNOSIS — G8929 Other chronic pain: Secondary | ICD-10-CM

## 2014-05-09 DIAGNOSIS — B999 Unspecified infectious disease: Secondary | ICD-10-CM | POA: Diagnosis present

## 2014-05-09 HISTORY — PX: I & D EXTREMITY: SHX5045

## 2014-05-09 LAB — LACTIC ACID, PLASMA: LACTIC ACID, VENOUS: 1.1 mmol/L (ref 0.5–2.0)

## 2014-05-09 LAB — GLUCOSE, CAPILLARY
GLUCOSE-CAPILLARY: 108 mg/dL — AB (ref 70–99)
Glucose-Capillary: 140 mg/dL — ABNORMAL HIGH (ref 70–99)
Glucose-Capillary: 148 mg/dL — ABNORMAL HIGH (ref 70–99)
Glucose-Capillary: 172 mg/dL — ABNORMAL HIGH (ref 70–99)
Glucose-Capillary: 99 mg/dL (ref 70–99)

## 2014-05-09 LAB — CBC WITH DIFFERENTIAL/PLATELET
Basophils Absolute: 0 10*3/uL (ref 0.0–0.1)
Basophils Relative: 0 % (ref 0–1)
EOS ABS: 0 10*3/uL (ref 0.0–0.7)
EOS PCT: 0 % (ref 0–5)
HCT: 40.6 % (ref 36.0–46.0)
Hemoglobin: 12.6 g/dL (ref 12.0–15.0)
LYMPHS ABS: 1.1 10*3/uL (ref 0.7–4.0)
Lymphocytes Relative: 8 % — ABNORMAL LOW (ref 12–46)
MCH: 29 pg (ref 26.0–34.0)
MCHC: 31 g/dL (ref 30.0–36.0)
MCV: 93.5 fL (ref 78.0–100.0)
MONO ABS: 0.4 10*3/uL (ref 0.1–1.0)
Monocytes Relative: 3 % (ref 3–12)
Neutro Abs: 11 10*3/uL — ABNORMAL HIGH (ref 1.7–7.7)
Neutrophils Relative %: 89 % — ABNORMAL HIGH (ref 43–77)
PLATELETS: 271 10*3/uL (ref 150–400)
RBC: 4.34 MIL/uL (ref 3.87–5.11)
RDW: 14.2 % (ref 11.5–15.5)
WBC: 12.4 10*3/uL — ABNORMAL HIGH (ref 4.0–10.5)

## 2014-05-09 LAB — GRAM STAIN

## 2014-05-09 LAB — COMPREHENSIVE METABOLIC PANEL
ALBUMIN: 4.2 g/dL (ref 3.5–5.2)
ALT: 18 U/L (ref 0–35)
ANION GAP: 11 (ref 5–15)
AST: 21 U/L (ref 0–37)
Alkaline Phosphatase: 101 U/L (ref 39–117)
BUN: 12 mg/dL (ref 6–23)
CHLORIDE: 99 mmol/L (ref 96–112)
CO2: 28 mmol/L (ref 19–32)
Calcium: 9.4 mg/dL (ref 8.4–10.5)
Creatinine, Ser: 0.93 mg/dL (ref 0.50–1.10)
GFR calc Af Amer: 76 mL/min — ABNORMAL LOW (ref >90)
GFR calc non Af Amer: 66 mL/min — ABNORMAL LOW (ref >90)
GLUCOSE: 147 mg/dL — AB (ref 70–99)
Potassium: 3.1 mmol/L — ABNORMAL LOW (ref 3.5–5.1)
Sodium: 138 mmol/L (ref 135–145)
Total Bilirubin: 0.4 mg/dL (ref 0.3–1.2)
Total Protein: 8.1 g/dL (ref 6.0–8.3)

## 2014-05-09 LAB — SURGICAL PCR SCREEN
MRSA, PCR: POSITIVE — AB
STAPHYLOCOCCUS AUREUS: POSITIVE — AB

## 2014-05-09 SURGERY — IRRIGATION AND DEBRIDEMENT EXTREMITY
Anesthesia: General | Site: Leg Upper | Laterality: Left

## 2014-05-09 MED ORDER — MEPERIDINE HCL 25 MG/ML IJ SOLN: 6.2500 mg | INTRAMUSCULAR | Status: DC | PRN

## 2014-05-09 MED ORDER — MUPIROCIN 2 % EX OINT
1.0000 "application " | TOPICAL_OINTMENT | Freq: Two times a day (BID) | CUTANEOUS | Status: DC
  Administered 2014-05-10 – 2014-05-14 (×9): 1 via NASAL
  Filled 2014-05-09: qty 22

## 2014-05-09 MED ORDER — METOCLOPRAMIDE HCL 5 MG/ML IJ SOLN
5.0000 mg | Freq: Three times a day (TID) | INTRAMUSCULAR | Status: DC | PRN
Start: 2014-05-09 — End: 2014-05-14
  Administered 2014-05-12: 10 mg via INTRAVENOUS
  Filled 2014-05-09: qty 2

## 2014-05-09 MED ORDER — INSULIN ASPART 100 UNIT/ML ~~LOC~~ SOLN: 0.0000 [IU] | SUBCUTANEOUS | Status: DC

## 2014-05-09 MED ORDER — VANCOMYCIN HCL 1000 MG IV SOLR
INTRAVENOUS | Status: AC
  Filled 2014-05-09: qty 1000

## 2014-05-09 MED ORDER — SODIUM CHLORIDE 0.9 % IJ SOLN
INTRAMUSCULAR | Status: AC
  Filled 2014-05-09: qty 10

## 2014-05-09 MED ORDER — ACETAMINOPHEN 650 MG RE SUPP: 650.0000 mg | Freq: Four times a day (QID) | RECTAL | Status: DC | PRN

## 2014-05-09 MED ORDER — VANCOMYCIN HCL 1000 MG IV SOLR
INTRAVENOUS | Status: DC | PRN
  Administered 2014-05-09: 1000 mg

## 2014-05-09 MED ORDER — FENTANYL CITRATE 0.05 MG/ML IJ SOLN
INTRAMUSCULAR | Status: DC | PRN
  Administered 2014-05-09 (×3): 50 ug via INTRAVENOUS

## 2014-05-09 MED ORDER — CHLORHEXIDINE GLUCONATE CLOTH 2 % EX PADS
6.0000 | MEDICATED_PAD | Freq: Every day | CUTANEOUS | Status: DC
  Administered 2014-05-09 – 2014-05-11 (×3): 6 via TOPICAL

## 2014-05-09 MED ORDER — ONDANSETRON HCL 4 MG PO TABS
4.0000 mg | ORAL_TABLET | Freq: Four times a day (QID) | ORAL | Status: DC | PRN
  Administered 2014-05-11 – 2014-05-14 (×4): 4 mg via ORAL

## 2014-05-09 MED ORDER — LACTATED RINGERS IV SOLN: INTRAVENOUS | Status: DC

## 2014-05-09 MED ORDER — ACETAMINOPHEN 325 MG PO TABS: 650.0000 mg | ORAL_TABLET | Freq: Four times a day (QID) | ORAL | Status: DC | PRN

## 2014-05-09 MED ORDER — FENTANYL CITRATE 0.05 MG/ML IJ SOLN
INTRAMUSCULAR | Status: AC
  Filled 2014-05-09: qty 5

## 2014-05-09 MED ORDER — TOBRAMYCIN SULFATE 1.2 G IJ SOLR
INTRAMUSCULAR | Status: DC | PRN
  Administered 2014-05-09: 1.2 g

## 2014-05-09 MED ORDER — PROPOFOL 10 MG/ML IV BOLUS
INTRAVENOUS | Status: AC
  Filled 2014-05-09: qty 20

## 2014-05-09 MED ORDER — ONDANSETRON HCL 4 MG/2ML IJ SOLN
INTRAMUSCULAR | Status: DC | PRN
  Administered 2014-05-09: 4 mg via INTRAVENOUS

## 2014-05-09 MED ORDER — TOBRAMYCIN SULFATE 1.2 G IJ SOLR
INTRAMUSCULAR | Status: AC
  Filled 2014-05-09: qty 1.2

## 2014-05-09 MED ORDER — METOCLOPRAMIDE HCL 10 MG PO TABS
5.0000 mg | ORAL_TABLET | Freq: Three times a day (TID) | ORAL | Status: DC | PRN
  Filled 2014-05-09: qty 1

## 2014-05-09 MED ORDER — PROMETHAZINE HCL 25 MG/ML IJ SOLN
6.2500 mg | INTRAMUSCULAR | Status: AC | PRN
  Administered 2014-05-09 – 2014-05-10 (×2): 12.5 mg via INTRAVENOUS
  Filled 2014-05-09 (×2): qty 1

## 2014-05-09 MED ORDER — FENTANYL CITRATE 0.05 MG/ML IJ SOLN
25.0000 ug | INTRAMUSCULAR | Status: DC | PRN
  Administered 2014-05-09 (×5): 25 ug via INTRAVENOUS

## 2014-05-09 MED ORDER — ONDANSETRON HCL 4 MG/2ML IJ SOLN
INTRAMUSCULAR | Status: AC
  Filled 2014-05-09: qty 2

## 2014-05-09 MED ORDER — GLYCOPYRROLATE 0.2 MG/ML IJ SOLN
INTRAMUSCULAR | Status: DC | PRN
  Administered 2014-05-09: 0.2 mg via INTRAVENOUS

## 2014-05-09 MED ORDER — EPHEDRINE SULFATE 50 MG/ML IJ SOLN
INTRAMUSCULAR | Status: AC
  Filled 2014-05-09: qty 1

## 2014-05-09 MED ORDER — POTASSIUM CHLORIDE CRYS ER 20 MEQ PO TBCR: 40.0000 meq | EXTENDED_RELEASE_TABLET | Freq: Four times a day (QID) | ORAL | Status: DC

## 2014-05-09 MED ORDER — FENTANYL CITRATE 0.05 MG/ML IJ SOLN
INTRAMUSCULAR | Status: AC
  Filled 2014-05-09: qty 2

## 2014-05-09 MED ORDER — ONDANSETRON HCL 4 MG/2ML IJ SOLN
4.0000 mg | Freq: Four times a day (QID) | INTRAMUSCULAR | Status: DC | PRN
  Filled 2014-05-09: qty 2

## 2014-05-09 MED ORDER — POTASSIUM CHLORIDE 10 MEQ/100ML IV SOLN
10.0000 meq | INTRAVENOUS | Status: AC
  Administered 2014-05-09 (×4): 10 meq via INTRAVENOUS
  Filled 2014-05-09 (×4): qty 100

## 2014-05-09 MED ORDER — SODIUM CHLORIDE 0.9 % IJ SOLN
10.0000 mL | INTRAMUSCULAR | Status: DC | PRN
  Administered 2014-05-11: 10 mL
  Filled 2014-05-09: qty 40

## 2014-05-09 MED ORDER — BUPIVACAINE HCL (PF) 0.5 % IJ SOLN
INTRAMUSCULAR | Status: AC
  Filled 2014-05-09: qty 30

## 2014-05-09 MED ORDER — EPHEDRINE SULFATE 50 MG/ML IJ SOLN
INTRAMUSCULAR | Status: DC | PRN
  Administered 2014-05-09 (×5): 5 mg via INTRAVENOUS

## 2014-05-09 MED ORDER — PROPOFOL 10 MG/ML IV BOLUS
INTRAVENOUS | Status: DC | PRN
  Administered 2014-05-09: 170 mg via INTRAVENOUS

## 2014-05-09 MED ORDER — LACTATED RINGERS IV SOLN
INTRAVENOUS | Status: DC | PRN
  Administered 2014-05-09: 14:00:00 via INTRAVENOUS

## 2014-05-09 MED ORDER — LACTATED RINGERS IV SOLN
INTRAVENOUS | Status: DC
  Administered 2014-05-10: 02:00:00 via INTRAVENOUS

## 2014-05-09 MED ORDER — LIDOCAINE HCL (CARDIAC) 20 MG/ML IV SOLN
INTRAVENOUS | Status: DC | PRN
  Administered 2014-05-09: 50 mg via INTRAVENOUS

## 2014-05-09 MED ORDER — 0.9 % SODIUM CHLORIDE (POUR BTL) OPTIME
TOPICAL | Status: DC | PRN
  Administered 2014-05-09: 5000 mL

## 2014-05-09 MED ORDER — ENOXAPARIN SODIUM 40 MG/0.4ML ~~LOC~~ SOLN
40.0000 mg | SUBCUTANEOUS | Status: DC
Start: 2014-05-10 — End: 2014-05-14
  Administered 2014-05-10 – 2014-05-14 (×5): 40 mg via SUBCUTANEOUS
  Filled 2014-05-09 (×6): qty 0.4

## 2014-05-09 SURGICAL SUPPLY — 41 items
BAG SPEC THK2 15X12 ZIP CLS (MISCELLANEOUS) ×1
BAG ZIPLOCK 12X15 (MISCELLANEOUS) ×3 IMPLANT
BANDAGE ELASTIC 6 VELCRO ST LF (GAUZE/BANDAGES/DRESSINGS) ×2 IMPLANT
BANDAGE ESMARK 6X9 LF (GAUZE/BANDAGES/DRESSINGS) ×1 IMPLANT
BNDG CMPR 9X6 STRL LF SNTH (GAUZE/BANDAGES/DRESSINGS)
BNDG ESMARK 6X9 LF (GAUZE/BANDAGES/DRESSINGS)
BNDG GAUZE ELAST 4 BULKY (GAUZE/BANDAGES/DRESSINGS) ×1 IMPLANT
CUFF TOURN SGL QUICK 18 (TOURNIQUET CUFF) IMPLANT
CUFF TOURN SGL QUICK 24 (TOURNIQUET CUFF)
CUFF TOURN SGL QUICK 34 (TOURNIQUET CUFF)
CUFF TRNQT CYL 24X4X40X1 (TOURNIQUET CUFF) IMPLANT
CUFF TRNQT CYL 34X4X40X1 (TOURNIQUET CUFF) IMPLANT
DRAIN PENROSE 18X1/2 LTX STRL (DRAIN) ×1 IMPLANT
DRAPE LAPAROTOMY T 102X78X121 (DRAPES) ×3 IMPLANT
DRSG AQUACEL AG ADV 3.5X10 (GAUZE/BANDAGES/DRESSINGS) ×2 IMPLANT
DRSG PAD ABDOMINAL 8X10 ST (GAUZE/BANDAGES/DRESSINGS) ×2 IMPLANT
DURAPREP 26ML APPLICATOR (WOUND CARE) ×3 IMPLANT
ELECT REM PT RETURN 9FT ADLT (ELECTROSURGICAL) ×3
ELECTRODE REM PT RTRN 9FT ADLT (ELECTROSURGICAL) ×1 IMPLANT
GAUZE SPONGE 4X4 12PLY STRL (GAUZE/BANDAGES/DRESSINGS) ×1 IMPLANT
GLOVE SURG ORTHO 8.0 STRL STRW (GLOVE) ×5 IMPLANT
GOWN STRL REUS W/TWL LRG LVL3 (GOWN DISPOSABLE) ×3 IMPLANT
HANDPIECE INTERPULSE COAX TIP (DISPOSABLE)
KIT BASIN OR (CUSTOM PROCEDURE TRAY) ×3 IMPLANT
KIT STIMULAN RAPID CURE  10CC (Orthopedic Implant) ×2 IMPLANT
KIT STIMULAN RAPID CURE 10CC (Orthopedic Implant) IMPLANT
PACK ORTHO EXTREMITY (CUSTOM PROCEDURE TRAY) ×3 IMPLANT
PAD CAST 4YDX4 CTTN HI CHSV (CAST SUPPLIES) ×1 IMPLANT
PADDING CAST COTTON 4X4 STRL (CAST SUPPLIES)
POSITIONER SURGICAL ARM (MISCELLANEOUS) ×3 IMPLANT
SET HNDPC FAN SPRY TIP SCT (DISPOSABLE) ×1 IMPLANT
SPONGE LAP 18X18 X RAY DECT (DISPOSABLE) ×6 IMPLANT
SUT ETHILON 2 0 PSLX (SUTURE) ×2 IMPLANT
SUT ETHILON 3 0 PS 1 (SUTURE) ×8 IMPLANT
SUT VIC AB 0 CT1 27 (SUTURE) ×6
SUT VIC AB 0 CT1 27XBRD ANTBC (SUTURE) IMPLANT
SUT VIC AB 1 CT1 36 (SUTURE) ×4 IMPLANT
SUT VIC AB 2-0 CT1 27 (SUTURE) ×9
SUT VIC AB 2-0 CT1 TAPERPNT 27 (SUTURE) IMPLANT
SYR CONTROL 10ML LL (SYRINGE) ×3 IMPLANT
TOWEL OR 17X26 10 PK STRL BLUE (TOWEL DISPOSABLE) ×6 IMPLANT

## 2014-05-09 NOTE — Progress Notes (Signed)
Utilization review completed.  

## 2014-05-09 NOTE — Progress Notes (Signed)
Pt received security envelope. Pt put silver bracelet and necklace on, kept earrings (X2 pair) and pink bracelet in envelope in bed.

## 2014-05-09 NOTE — Anesthesia Postprocedure Evaluation (Signed)
  Anesthesia Post-op Note  Patient: [name] Carr  Procedure(s) Performed: Procedure(s) (LRB): IRRIGATION AND DEBRIDEMENT LEFT THIGH ABSCESS (Left)  Patient Location: PACU  Anesthesia Type: General  Level of Consciousness: awake and alert   Airway and Oxygen Therapy: Patient Spontanous Breathing  Post-op Pain: mild  Post-op Assessment: Post-op Vital signs reviewed, Patient's Cardiovascular Status Stable, Respiratory Function Stable, Patent Airway and No signs of Nausea or vomiting  Last Vitals:  Filed Vitals:   05/09/14 2305  BP: 132/62  Pulse: 72  Temp: 36.3 C  Resp: 20    Post-op Vital Signs: stable   Complications: No apparent anesthesia complications

## 2014-05-09 NOTE — Transfer of Care (Signed)
Immediate Anesthesia Transfer of Care Note  Patient: [name] Carr  Procedure(s) Performed: Procedure(s) (LRB): IRRIGATION AND DEBRIDEMENT LEFT THIGH ABSCESS (Left)  Patient Location: PACU  Anesthesia Type: General  Level of Consciousness: sedated, patient cooperative and responds to stimulation  Airway & Oxygen Therapy: Patient Spontanous Breathing and Patient connected to face mask oxgen  Post-op Assessment: Report given to PACU RN and Post -op Vital signs reviewed and stable  Post vital signs: Reviewed and stable  Complications: No apparent anesthesia complications

## 2014-05-09 NOTE — Progress Notes (Signed)
[facility] PROGRESS NOTE  [name] Carr BZJ:[id number] DOB: 06/07/1955 DOA: 05/08/2014 PCP: [name], MD  Assessment/Plan: 1. Left thigh abscess -Patient with a history of deep soft tissue abscess of left thigh, admitted back in November 2015 undergoing incision and drainage on 12/22/2013 as cultures grew Mycobacterium fortuitum. -At the time she was discharged on Ceftin orally and 600 mg IV twice a day after PICC line placement. She had been following Dr. Megan Salon of infectious disease at the clinic. -She now presents with recurrent left thigh abscess as demonstrated by CT scan. Orthopedic surgery has been consulted. Plan for excisional debridement in the next 24 hours -Antibiotic regimen was discussed with Dr. Megan Salon as susceptibilities have been requested from the lab on this organism. -We'll await further recommendations from infectious disease, meanwhile continue her current antimicrobial regimen  2.  SLE -Patient is on chronic steroid therapy, has been placed on stress doses of hydrocortisone in anticipation of upcoming surgical procedure. -For now continue hydrocortisone 50 mg IV every 8 hours with plans to transition to oral prednisone after procedure  3.  Hypokalemia -Labs showing potassium of 3.1, she received potassium chloride 40 mEq IV -Repeat labs in a.m.  4.  Type 2 diabetes mellitus. -Patient is currently nothing by mouth, continue sliding scale coverage every 4 hours  Code Status: Full code Family Communication: Family not present Disposition Plan: Patient likely to be taken to the OR today   Consultants:  Infectious disease  Orthopedic surgery   Antibiotics:  Doxycycline  Azithromycin  HPI/Subjective: Patient is a pleasant 59 year old female with a past medical history of left thigh infection status post incision and drainage in November 2015 was admitted to medicine service on 05/08/2014 presenting with complaints of left thigh pain and  swelling. He had a CT scan of her left thigh ordered by her primary care provider that showed recurrent deep soft tissue abscess in the vastus lateralis and rectus for Morris. Abscess demarcated by fluid and gas within the anterior lateral musculature of the left thigh. She was instructed by her primary care provider to present to the emergency department. Patient was seen and evaluated by orthopedic surgery as she will likely be taken to the operating room for extensive excisional debridement within the next 24 hours. Patient remains nothing by mouth, was started on empiric IV antibiotic therapy with doxycycline and a azithromycin. Patient is well-known to Dr. Megan Salon of infectious disease. I spoke with Dr. Megan Salon regarding susceptibility testing of organism isolated from cultures back in November 2015.  Objective: Filed Vitals:   05/09/14 0542  BP: 126/63  Pulse: 80  Temp: 97.6 F (36.4 C)  Resp: 12    Intake/Output Summary (Last 24 hours) at 05/09/14 1331 Last data filed at 05/09/14 1258  Gross per 24 hour  Intake      0 ml  Output    725 ml  Net   -725 ml   Filed Weights   05/08/14 2302  Weight: 77.157 kg (170 lb 1.6 oz)    Exam:   General:  Patient is in no acute distress, she is awake, alert, mentating well  Cardiovascular: Regular rate and rhythm normal S1-S2 no murmurs rubs or gallops  Respiratory: Normal respiratory effort, lungs are clear to auscultation  Abdomen: Soft nontender nondistended positive bowel sounds  Musculoskeletal: Patient having swelling over left thigh region, open wound with minimal amount of purulent drainage noted, there is some localized erythema and pain  Data Reviewed: Basic Metabolic Panel:  Recent Labs Lab  05/07/14 1459 05/08/14 1958 05/09/14 0600  NA 136 137 138  K 3.5 3.1* 3.1*  CL 93* 96 99  CO2 29 30 28   GLUCOSE 130* 105* 147*  BUN 12 14 12   CREATININE 0.90 0.88 0.93  CALCIUM 10.2 9.1 9.4   Liver Function  Tests:  Recent Labs Lab 05/07/14 1459 05/08/14 1958 05/09/14 0600  AST 16 19 21   ALT 15 18 18   ALKPHOS 113 97 101  BILITOT 0.7 0.3 0.4  PROT 8.3 7.7 8.1  ALBUMIN 4.7 4.1 4.2   No results for input(s): LIPASE, AMYLASE in the last 168 hours. No results for input(s): AMMONIA in the last 168 hours. CBC:  Recent Labs Lab 05/07/14 1457 05/08/14 1958 05/09/14 0600  WBC 11.8* 11.4* 12.4*  NEUTROABS 10.5* 9.1* 11.0*  HGB 13.9 11.8* 12.6  HCT 42.7 36.2 40.6  MCV 91 91.9 93.5  PLT 260 248 271   Cardiac Enzymes: No results for input(s): CKTOTAL, CKMB, CKMBINDEX, TROPONINI in the last 168 hours. BNP (last 3 results) No results for input(s): BNP in the last 8760 hours.  ProBNP (last 3 results) No results for input(s): PROBNP in the last 8760 hours.  CBG:  Recent Labs Lab 05/08/14 2358 05/09/14 0354 05/09/14 0815 05/09/14 1150  GLUCAP 140* 108* 148* 172*    Recent Results (from the past 240 hour(s))  Surgical pcr screen     Status: Abnormal   Collection Time: 05/08/14 11:29 PM  Result Value Ref Range Status   MRSA, PCR POSITIVE (A) NEGATIVE Final    Comment: RESULT CALLED TO, READ BACK BY AND VERIFIED WITH: MERRITT,A/5E @0224  ON 05/09/14 BY KARCZEWSKI,S.    Staphylococcus aureus POSITIVE (A) NEGATIVE Final    Comment:        The Xpert SA Assay (FDA approved for NASAL specimens in patients over 67 years of age), is one component of a comprehensive surveillance program.  Test performance has been validated by [facility] for patients greater than or equal to 48 year old. It is not intended to diagnose infection nor to guide or monitor treatment.      Studies: US Abdomen Complete  05/07/2014   CLINICAL DATA:  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 ; interval negative CT scans  EXAM: ULTRASOUND ABDOMEN COMPLETE  COMPARISON:  None.  FINDINGS: Gallbladder: The gallbladder is adequately distended. There are no stones. There is no wall thickening or  pericholecystic fluid. There is no positive sonographic Murphy's sign.  Common bile duct: Diameter: 2 mm  Liver: The hepatic echotexture is normal. There is a 7 mm hyperechoic structure in the right lobe without increased vascularity.  IVC: Partially obscured by bowel gas.  Pancreas: Evaluation the pancreas was limited by bowel gas.  Spleen: Size and appearance within normal limits.  Right Kidney: Length: 11 mm. Echogenicity within normal limits. No mass or hydronephrosis visualized.  Left Kidney: Length: 10.7 mm. Echogenicity within normal limits. No mass or hydronephrosis visualized.  Abdominal aorta: Evaluation of the abdominal aorta was limited by bowel gas. The proximal aorta exhibits a maximum of 2.4 cm in diameter.  Other findings: No ascites is demonstrated.  IMPRESSION: 1. There is a 7 mm hyperechoic focus in the right hepatic lobe. Statistically this is most compatible with a hemangioma. No abnormalities elsewhere in the liver are demonstrated. For further evaluation of the hepatic parenchyma a hepatic protocol MRI would be useful. 2. No acute abnormality is demonstrated elsewhere within the abdomen. Bowel gas limits the study somewhat.  Electronically Signed   By: David  Martinique   On: 05/07/2014 14:37   Ct Femur Left Wo Contrast  05/07/2014   CLINICAL DATA:  Previous incision and drainage of the lateral LEFT thigh. Open thigh wound, left, sequela S71.102S (ICD-10-CM)  EXAM: CT OF THE LEFT FEMUR WITHOUT CONTRAST  TECHNIQUE: Multidetector CT imaging was performed according to the standard protocol. Multiplanar CT image reconstructions were also generated.  COMPARISON:  12/25/2013.  FINDINGS: Extensive subcutaneous calcification is present, likely due to a combination of heterotopic bone, dystrophic calcification and injection granulomata. Infiltration of the subcutaneous tissues is present in the anterior lateral LEFT thigh. This is the area of previously seen open wound extending deep to some of the  calcifications.  There is an ellipsoid fluid collection just deep to the vastus lateralis muscular fascia. Small locules of gas are present in this location. When measured craniocaudal, this is 8.2 cm. On axial image 98, the collection measures 18 mm transverse by 5 cm AP. No subcutaneous fluid collection is identified. Gas tracks around to the rectus femoris muscle, consistent with extension of the abscess through the anterior compartment.  Phlegmon is present in the soft tissues, likely representing cellulitis although nonspecific. Edema and postoperative changes can produce this appearance as well. The visceral pelvis appears within normal limits. Mild symmetric atrophy of the anterior muscular compartment is present bilaterally. The RIGHT thigh is partially visible.  IMPRESSION: 1. Recurrent deep soft tissue abscess in the vastus lateralis and rectus femoris, just deep to the superficial muscular fascia. On this noncontrast study, the abscess is demarcated by fluid and gas within the anterior lateral musculature of the LEFT thigh. 2. No subcutaneous abscess. Infiltration of the subcutaneous tissues may represent scarring or more likely cellulitis in the setting of deep abscess. 3. Dystrophic calcifications throughout the subcutaneous tissues. Potentially, these could serve as a nidus for recurrent infection.   Electronically Signed   By: Dereck Ligas M.D.   On: 05/07/2014 15:17    Scheduled Meds: . azithromycin  500 mg Intravenous QHS  . Chlorhexidine Gluconate Cloth  6 each Topical Q0600  . clonazePAM  1 mg Oral BID  . doxycycline (VIBRAMYCIN) IV  100 mg Intravenous BID  . fentaNYL  25 mcg Transdermal Q72H  . hydrocortisone sod succinate (SOLU-CORTEF) inj  50 mg Intravenous 3 times per day  . insulin aspart  0-9 Units Subcutaneous Q4H  . [START ON 05/10/2014] mupirocin ointment  1 application Nasal BID  . potassium chloride  40 mEq Oral Q6H  . rOPINIRole  1 mg Oral QHS  . traZODone  150-300 mg  Oral QHS   Continuous Infusions: . sodium chloride 100 mL/hr at 05/09/14 1054    Principal Problem:   Abscess of left thigh Active Problems:   Chronic pain syndrome   SLE-on prednisone-Follow at [facility]   Diabetes mellitus type 2, controlled    Time spent: 35 min    Kelvin Cellar  [facility] Pager [phone]. If 7PM-7AM, please contact night-coverage at [email or web address], password [facility] 05/09/2014, 1:31 PM  LOS: 1 day

## 2014-05-09 NOTE — Brief Op Note (Signed)
05/08/2014 - 05/09/2014  6:14 PM  PATIENT:  [name] Carr  59 y.o. female  PRE-OPERATIVE DIAGNOSIS:  abscess of left thigh   POST-OPERATIVE DIAGNOSIS:  abscess of left thigh   PROCEDURE:  Procedure(s): IRRIGATION AND DEBRIDEMENT LEFT THIGH ABSCESS  SURGEON:  Surgeon(s): Meredith Pel, MD  ASSISTANT: none  ANESTHESIA:   general  EBL: 75 ml    Total I/O In: 0  Out: 725 [Urine:725]  BLOOD ADMINISTERED: none  DRAINS: none   LOCAL MEDICATIONS USED:  stimulan abx beads  SPECIMEN:  Cultures x 2  COUNTS:  YES  TOURNIQUET:  * No tourniquets in log *  DICTATION: .Other Dictation: Dictation Number [id number]  PLAN OF CARE: Admit to inpatient   PATIENT DISPOSITION:  PACU - hemodynamically stable

## 2014-05-09 NOTE — Progress Notes (Signed)
Peripherally Inserted Central Catheter/Midline Placement  The IV Nurse has discussed with the patient and/or persons authorized to consent for the patient, the purpose of this procedure and the potential benefits and risks involved with this procedure.  The benefits include less needle sticks, lab draws from the catheter and patient may be discharged home with the catheter.  Risks include, but not limited to, infection, bleeding, blood clot (thrombus formation), and puncture of an artery; nerve damage and irregular heat beat.  Alternatives to this procedure were also discussed.  PICC/Midline Placement Documentation  PICC / Midline Single Lumen 43/15/40 PICC Right Basilic 38 cm 2 cm (Active)     PICC / Midline Single Lumen 08/67/61 PICC Right Basilic 38 cm 2 cm (Active)  Indication for Insertion or Continuance of Line Poor Vasculature-patient has had multiple peripheral attempts or PIVs lasting less than 24 hours 05/09/2014 10:00 AM  Exposed Catheter (cm) 2 cm 05/09/2014 10:00 AM  Dressing Change Due 05/16/14 05/09/2014 10:00 AM       Jule Economy Horton 05/09/2014, 2:11 PM

## 2014-05-09 NOTE — Consult Note (Signed)
Reason for Consult: Left leg infection Referring Physician: Dr Aaron Edelman [name] Carr is an 59 y.o. female.  HPI: [name] Carr is is a 59 year old immunosuppressed female with lupus and multiple other medical comorbidities who is had unresolved leg pain and infection following high in the last year. Cultures positive for Mycobacterium. Patient lost to follow-up following the index procedure. She presents now with increasing left eye pain. CT scan obtained tonight showed abscess in infection below the anterolateral fascial plane of the thigh.  Past Medical History  Diagnosis Date  . PERSONALITY DISORDER   . Chronic pain syndrome     chronic narcotics, dependancy hx - [place] for pain mgmt  . MIGRAINE HEADACHE   . Lyme disease   . ALLERGIC RHINITIS   . ANEMIA-IRON DEFICIENCY   . HYPERLIPIDEMIA   . GERD   . DIABETES MELLITUS, TYPE II   . DEPRESSION   . SLE (systemic lupus erythematosus)     [facility] - chronic pred  . Psoriasis   . Addison's disease   . Degenerative joint disease   . DDD (degenerative disc disease)   . Phlebitis after infusion     left ?calf; "S/P phenergan/demerol injection"  . Blood clot in vein 2011; 02/04/11    "right jugular vein;left jugular vein"  . SEIZURE DISORDER     "lots of seizures; due to lupus"  . Anxiety   . Neuropathy     hands, feet, due to diabetes & back problem  . Surgical wound infection 02/15/2011    R shoulder and mastectomy site - related to PICC  . CARCINOMA, BREAST 08/2009 dx    R breast,  mets to liver - resolution s/p chemo, s/p B mastect  . Metastases to the liver   . RA (rheumatoid arthritis)   . Hypothyroidism pt states she has never had hy pothyroidism  . Hypertension     Past Surgical History  Procedure Laterality Date  . Liver bopsy  09/2009  . Port a cath placement  09/2009; 11/19/10  . Port-a-cath removal  04/2010    "due to staph & strept"  . Port-a-cath removal  12/23/2010    Procedure: REMOVAL PORT-A-CATH;   Surgeon: Pedro Earls, MD;  Location: [facility];  Service: General;  Laterality: Left;  . Breast surgery  09/2009    right breast biopsy  . Mastectomy  11/19/10    bilateral mastectomy   . I&d extremity  ~ 2010    right hand; S/P "dog bite"  . Peripherally inserted central catheter insertion    . Spider bite    . Incision and drainage abscess Left 12/22/2013    Procedure: INCISION AND DRAINAGE ABSCESS OF LEFT THIGH;  Surgeon: Donnie Mesa, MD;  Location: [facility] OR;  Service: General;  Laterality: Left;    Family History  Problem Relation Age of Onset  . Lung cancer Mother   . Lung cancer Father   . Arthritis Other   . Diabetes Other   . Hyperlipidemia Other     Social History:  reports that she has never smoked. She has never used smokeless tobacco. She reports that she does not drink alcohol or use illicit drugs.  Allergies:  Allergies  Allergen Reactions  . Ammonia Other (See Comments)    Ended up on ventilator from ammonia tabs  . Contrast Media [Iodinated Diagnostic Agents] Other (See Comments)    Doesn't breath well.  . Iohexol Other (See Comments)    SOB   . Methotrexate Anaphylaxis  .  Midazolam Hcl Anaphylaxis  . Nalbuphine Other (See Comments)    Can't breathe well  . Infliximab Nausea And Vomiting  . Ketorolac Tromethamine Other (See Comments)    Injectable doesn't work and pill hurts stomach.  . Morphine And Related Hives  . Nsaids Nausea And Vomiting  . Celecoxib Rash    Medications: I have reviewed the patient's current medications.  Results for orders placed or performed during the hospital encounter of 05/08/14 (from the past 48 hour(s))  Comprehensive metabolic panel     Status: Abnormal   Collection Time: 05/08/14  7:58 PM  Result Value Ref Range   Sodium 137 135 - 145 mmol/L   Potassium 3.1 (L) 3.5 - 5.1 mmol/L   Chloride 96 96 - 112 mmol/L   CO2 30 19 - 32 mmol/L   Glucose, Bld 105 (H) 70 - 99 mg/dL   BUN 14 6 - 23 mg/dL   Creatinine, Ser 0.88  0.50 - 1.10 mg/dL   Calcium 9.1 8.4 - 10.5 mg/dL   Total Protein 7.7 6.0 - 8.3 g/dL   Albumin 4.1 3.5 - 5.2 g/dL   AST 19 0 - 37 U/L   ALT 18 0 - 35 U/L   Alkaline Phosphatase 97 39 - 117 U/L   Total Bilirubin 0.3 0.3 - 1.2 mg/dL   GFR calc non Af Amer 71 (L) >90 mL/min   GFR calc Af Amer 82 (L) >90 mL/min    Comment: (NOTE) The eGFR has been calculated using the CKD EPI equation. This calculation has not been validated in all clinical situations. eGFR's persistently <90 mL/min signify possible Chronic Kidney Disease.    Anion gap 11 5 - 15  CBC with Differential/Platelet     Status: Abnormal   Collection Time: 05/08/14  7:58 PM  Result Value Ref Range   WBC 11.4 (H) 4.0 - 10.5 K/uL   RBC 3.94 3.87 - 5.11 MIL/uL   Hemoglobin 11.8 (L) 12.0 - 15.0 g/dL   HCT 36.2 36.0 - 46.0 %   MCV 91.9 78.0 - 100.0 fL   MCH 29.9 26.0 - 34.0 pg   MCHC 32.6 30.0 - 36.0 g/dL   RDW 14.1 11.5 - 15.5 %   Platelets 248 150 - 400 K/uL   Neutrophils Relative % 81 (H) 43 - 77 %   Neutro Abs 9.1 (H) 1.7 - 7.7 K/uL   Lymphocytes Relative 14 12 - 46 %   Lymphs Abs 1.6 0.7 - 4.0 K/uL   Monocytes Relative 5 3 - 12 %   Monocytes Absolute 0.6 0.1 - 1.0 K/uL   Eosinophils Relative 0 0 - 5 %   Eosinophils Absolute 0.0 0.0 - 0.7 K/uL   Basophils Relative 0 0 - 1 %   Basophils Absolute 0.0 0.0 - 0.1 K/uL  Lactic acid, plasma     Status: None   Collection Time: 05/08/14 11:10 PM  Result Value Ref Range   Lactic Acid, Venous 1.1 0.5 - 2.0 mmol/L  Glucose, capillary     Status: Abnormal   Collection Time: 05/08/14 11:58 PM  Result Value Ref Range   Glucose-Capillary 140 (H) 70 - 99 mg/dL    US Abdomen Complete  05/07/2014   CLINICAL DATA:  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 ; interval negative CT scans  EXAM: ULTRASOUND ABDOMEN COMPLETE  COMPARISON:  None.  FINDINGS: Gallbladder: The gallbladder is adequately distended. There are no stones. There is no wall thickening or pericholecystic fluid.  There is no  positive sonographic Murphy's sign.  Common bile duct: Diameter: 2 mm  Liver: The hepatic echotexture is normal. There is a 7 mm hyperechoic structure in the right lobe without increased vascularity.  IVC: Partially obscured by bowel gas.  Pancreas: Evaluation the pancreas was limited by bowel gas.  Spleen: Size and appearance within normal limits.  Right Kidney: Length: 11 mm. Echogenicity within normal limits. No mass or hydronephrosis visualized.  Left Kidney: Length: 10.7 mm. Echogenicity within normal limits. No mass or hydronephrosis visualized.  Abdominal aorta: Evaluation of the abdominal aorta was limited by bowel gas. The proximal aorta exhibits a maximum of 2.4 cm in diameter.  Other findings: No ascites is demonstrated.  IMPRESSION: 1. There is a 7 mm hyperechoic focus in the right hepatic lobe. Statistically this is most compatible with a hemangioma. No abnormalities elsewhere in the liver are demonstrated. For further evaluation of the hepatic parenchyma a hepatic protocol MRI would be useful. 2. No acute abnormality is demonstrated elsewhere within the abdomen. Bowel gas limits the study somewhat.   Electronically Signed   By: David  Martinique   On: 05/07/2014 14:37   Ct Femur Left Wo Contrast  05/07/2014   CLINICAL DATA:  Previous incision and drainage of the lateral LEFT thigh. Open thigh wound, left, sequela S71.102S (ICD-10-CM)  EXAM: CT OF THE LEFT FEMUR WITHOUT CONTRAST  TECHNIQUE: Multidetector CT imaging was performed according to the standard protocol. Multiplanar CT image reconstructions were also generated.  COMPARISON:  12/25/2013.  FINDINGS: Extensive subcutaneous calcification is present, likely due to a combination of heterotopic bone, dystrophic calcification and injection granulomata. Infiltration of the subcutaneous tissues is present in the anterior lateral LEFT thigh. This is the area of previously seen open wound extending deep to some of the calcifications.  There is  an ellipsoid fluid collection just deep to the vastus lateralis muscular fascia. Small locules of gas are present in this location. When measured craniocaudal, this is 8.2 cm. On axial image 98, the collection measures 18 mm transverse by 5 cm AP. No subcutaneous fluid collection is identified. Gas tracks around to the rectus femoris muscle, consistent with extension of the abscess through the anterior compartment.  Phlegmon is present in the soft tissues, likely representing cellulitis although nonspecific. Edema and postoperative changes can produce this appearance as well. The visceral pelvis appears within normal limits. Mild symmetric atrophy of the anterior muscular compartment is present bilaterally. The RIGHT thigh is partially visible.  IMPRESSION: 1. Recurrent deep soft tissue abscess in the vastus lateralis and rectus femoris, just deep to the superficial muscular fascia. On this noncontrast study, the abscess is demarcated by fluid and gas within the anterior lateral musculature of the LEFT thigh. 2. No subcutaneous abscess. Infiltration of the subcutaneous tissues may represent scarring or more likely cellulitis in the setting of deep abscess. 3. Dystrophic calcifications throughout the subcutaneous tissues. Potentially, these could serve as a nidus for recurrent infection.   Electronically Signed   By: Dereck Ligas M.D.   On: 05/07/2014 15:17    Review of Systems  Constitutional: Negative.   HENT: Negative.   Eyes: Negative.   Respiratory: Negative.   Cardiovascular: Negative.   Gastrointestinal: Negative.   Genitourinary: Negative.   Musculoskeletal: Positive for joint pain.  Skin: Negative.   Neurological: Negative.   Endo/Heme/Allergies: Negative.   Psychiatric/Behavioral: Negative.    Blood pressure 145/60, pulse 71, temperature 98.1 F (36.7 C), temperature source Oral, resp. rate 20, height 5' 5"  (1.651 m), weight  77.157 kg (170 lb 1.6 oz), SpO2 93 %. Physical Exam   Constitutional: She appears well-developed.  HENT:  Head: Normocephalic.  Eyes: Pupils are equal, round, and reactive to light.  Neck: Normal range of motion.  Cardiovascular: Normal rate.   Respiratory: Effort normal.  Neurological: She is alert.  Skin: Skin is warm.  Psychiatric: She has a normal mood and affect.   left leg demonstrates signed draining sinus tract anterolateral proximal thigh. Distal and anterior to that she has about a 8 x 8 cm hard tender nonfluctuant mass in the subdermal subcutaneous fascial areas. Pedal pulses palpable compartments are soft on the left-hand side extensor mechanism is intact. No groin pain with internal extra rotation of the leg.  Assessment/Plan: Impression is left thigh infection/abscess with multiple calcifications granulomas in the subcutaneous and  subdermal layers. Plan is for extensive excisional debridement. Tomorrow afternoon. Risk and benefits discussed with the patient including but not limited to infection nerve vessel damage leg weakness as well as persistent infection. All questions answered.  Destin Kittler SCOTT 05/09/2014, 12:55 AM

## 2014-05-09 NOTE — Anesthesia Preprocedure Evaluation (Addendum)
Anesthesia Evaluation  Patient identified by MRN, date of birth, ID band Patient awake    Reviewed: Allergy & Precautions, H&P , NPO status , Patient's Chart, lab work & pertinent test results, reviewed documented beta blocker date and time   Airway Mallampati: I  TM Distance: >3 FB Neck ROM: Full    Dental  (+) Edentulous Upper, Edentulous Lower   Pulmonary neg pulmonary ROS,          Cardiovascular hypertension, + Peripheral Vascular Disease     Neuro/Psych  Headaches, Seizures -,  Anxiety Depression    GI/Hepatic Neg liver ROS, GERD-  Medicated and Controlled,  Endo/Other  diabetes, Type 2Hypothyroidism   Renal/GU Renal disease  negative genitourinary   Musculoskeletal negative musculoskeletal ROS (+) Arthritis -, on steriods ,  lupus   Abdominal   Peds negative pediatric ROS (+)  Hematology  (+) anemia ,   Anesthesia Other Findings   Reproductive/Obstetrics negative OB ROS                            Anesthesia Physical  Anesthesia Plan  ASA: III  Anesthesia Plan: General   Post-op Pain Management:    Induction: Intravenous  Airway Management Planned: LMA  Additional Equipment:   Intra-op Plan:   Post-operative Plan: Extubation in OR  Informed Consent: I have reviewed the patients History and Physical, chart, labs and discussed the procedure including the risks, benefits and alternatives for the proposed anesthesia with the patient or authorized representative who has indicated his/her understanding and acceptance.   Dental advisory given  Plan Discussed with: CRNA  Anesthesia Plan Comments:        Anesthesia Quick Evaluation

## 2014-05-09 NOTE — Progress Notes (Signed)
Patient ID: [name] Carr, female   DOB: Nov 01, 1955, 59 y.o.   MRN: [id number]         [facility]    Date of Admission:  05/08/2014     Principal Problem:   Abscess of left thigh Active Problems:   Mycobacterium fortuitum infection   Chronic pain syndrome   SLE-on prednisone-Follow at [facility]   Diabetes mellitus type 2, controlled   . azithromycin  500 mg Intravenous QHS  . Chlorhexidine Gluconate Cloth  6 each Topical Q0600  . clonazePAM  1 mg Oral BID  . doxycycline (VIBRAMYCIN) IV  100 mg Intravenous BID  . fentaNYL  25 mcg Transdermal Q72H  . hydrocortisone sod succinate (SOLU-CORTEF) inj  50 mg Intravenous 3 times per day  . insulin aspart  0-9 Units Subcutaneous Q4H  . [START ON 05/10/2014] mupirocin ointment  1 application Nasal BID  . rOPINIRole  1 mg Oral QHS  . traZODone  150-300 mg Oral QHS    Past Medical History  Diagnosis Date  . PERSONALITY DISORDER   . Chronic pain syndrome     chronic narcotics, dependancy hx - [facility] for pain mgmt  . MIGRAINE HEADACHE   . Lyme disease   . ALLERGIC RHINITIS   . ANEMIA-IRON DEFICIENCY   . HYPERLIPIDEMIA   . GERD   . DIABETES MELLITUS, TYPE II   . DEPRESSION   . SLE (systemic lupus erythematosus)     [place] - chronic pred  . Psoriasis   . Addison's disease   . Degenerative joint disease   . DDD (degenerative disc disease)   . Phlebitis after infusion     left ?calf; "S/P phenergan/demerol injection"  . Blood clot in vein 2011; 02/04/11    "right jugular vein;left jugular vein"  . SEIZURE DISORDER     "lots of seizures; due to lupus"  . Anxiety   . Neuropathy     hands, feet, due to diabetes & back problem  . Surgical wound infection 02/15/2011    R shoulder and mastectomy site - related to PICC  . CARCINOMA, BREAST 08/2009 dx    R breast,  mets to liver - resolution s/p chemo, s/p B mastect  . Metastases to the liver   . RA (rheumatoid arthritis)   . Hypothyroidism pt  states she has never had hy pothyroidism  . Hypertension     History  Substance Use Topics  . Smoking status: Never Smoker   . Smokeless tobacco: Never Used     Comment: Never used tobacco  . Alcohol Use: No    Family History  Problem Relation Age of Onset  . Lung cancer Mother   . Lung cancer Father   . Arthritis Other   . Diabetes Other   . Hyperlipidemia Other    Allergies  Allergen Reactions  . Ammonia Other (See Comments)    Ended up on ventilator from ammonia tabs  . Contrast Media [Iodinated Diagnostic Agents] Other (See Comments)    Doesn't breath well.  . Iohexol Other (See Comments)    SOB   . Methotrexate Anaphylaxis  . Midazolam Hcl Anaphylaxis  . Nalbuphine Other (See Comments)    Can't breathe well  . Infliximab Nausea And Vomiting  . Ketorolac Tromethamine Other (See Comments)    Injectable doesn't work and pill hurts stomach.  . Morphine And Related Hives  . Nsaids Nausea And Vomiting  . Celecoxib Rash    OBJECTIVE: Blood pressure  126/63, pulse 80, temperature 97.6 F (36.4 C), temperature source Oral, resp. rate 12, height 5\' 5"  (1.651 m), weight 170 lb 1.6 oz (77.157 kg), SpO2 93 %.  She is on her way to surgery  Lab Results Lab Results  Component Value Date   WBC 12.4* 05/09/2014   HGB 12.6 05/09/2014   HCT 40.6 05/09/2014   MCV 93.5 05/09/2014   PLT 271 05/09/2014    Lab Results  Component Value Date   CREATININE 0.93 05/09/2014   BUN 12 05/09/2014   NA 138 05/09/2014   K 3.1* 05/09/2014   CL 99 05/09/2014   CO2 28 05/09/2014    Lab Results  Component Value Date   ALT 18 05/09/2014   AST 21 05/09/2014   ALKPHOS 101 05/09/2014   BILITOT 0.4 05/09/2014     Microbiology: Left thigh abscess culture 12/22/2013: multiple organisms (dose staph or strep) and Mycobacterium fortuitum sensitive to amikacin, ciprofloxacin, clarithromycin, doxycycline, imipenem, linezolid, and moxifloxacin. It was resistant to trimethoprim  sulfamethoxazole  Left thigh abscess culture 03/13/2014: Mycobacterium fortuitum  Assessment: Ms. Dudzinski has a chronic, polymicrobial left thigh abscess. Multiple organisms have been an isolated including Mycobacterium fortuitum. After hospitalization last November she was treated with outpatient IV antibiotics through a PICC. Her antibiotic therapy was complete she refused to have the PICC line removed by advanced home care and would not come for her visits in our clinic to have it removed. I'm not sure where but it was finally removed. She's been seen in our clinic on 2 occasions in the past 2 months. She was started on oral doxycycline and azithromycin by my partner, Dr. Linus Salmons, on 03/25/2014. There've been multiple telephone calls back to our office. Although she takes and seems to tolerate many oral medications she claims that she cannot tolerate any oral antibiotics because of nausea and vomiting. She states that she must have a PICC reinserted. She has told us that in her lifetime she has had over 20 PICCs and a port. It has been felt that she does not meet the Medicare standards of "homebound" and therefore was not eligible for home IV therapy supervised by a home care agency. She has refused to go to a skilled nursing facility. She was readmitted yesterday after a CT scan showed worsening of her chronic left thigh infection. I agree with continuing IV doxycycline and azithromycin pending operative findings and culture results.   Plan: 1. Continue current antibiotics pending operative findings and culture results  Michel Bickers, MD [facility] for [place] [phone] pager   [phone] cell 05/09/2014, 3:22 PM

## 2014-05-10 DIAGNOSIS — A318 Other mycobacterial infections: Secondary | ICD-10-CM

## 2014-05-10 DIAGNOSIS — F1910 Other psychoactive substance abuse, uncomplicated: Secondary | ICD-10-CM

## 2014-05-10 DIAGNOSIS — Z765 Malingerer [conscious simulation]: Secondary | ICD-10-CM | POA: Insufficient documentation

## 2014-05-10 LAB — BASIC METABOLIC PANEL
ANION GAP: 16 — AB (ref 5–15)
BUN: 11 mg/dL (ref 6–23)
CHLORIDE: 96 mmol/L (ref 96–112)
CO2: 28 mmol/L (ref 19–32)
Calcium: 8.8 mg/dL (ref 8.4–10.5)
Creatinine, Ser: 0.97 mg/dL (ref 0.50–1.10)
GFR calc non Af Amer: 63 mL/min — ABNORMAL LOW (ref >90)
GFR, EST AFRICAN AMERICAN: 73 mL/min — AB (ref >90)
Glucose, Bld: 105 mg/dL — ABNORMAL HIGH (ref 70–99)
Potassium: 3.2 mmol/L — ABNORMAL LOW (ref 3.5–5.1)
SODIUM: 140 mmol/L (ref 135–145)

## 2014-05-10 LAB — CBC
HEMATOCRIT: 32.6 % — AB (ref 36.0–46.0)
Hemoglobin: 10.4 g/dL — ABNORMAL LOW (ref 12.0–15.0)
MCH: 29.8 pg (ref 26.0–34.0)
MCHC: 31.9 g/dL (ref 30.0–36.0)
MCV: 93.4 fL (ref 78.0–100.0)
Platelets: 233 10*3/uL (ref 150–400)
RBC: 3.49 MIL/uL — ABNORMAL LOW (ref 3.87–5.11)
RDW: 14.2 % (ref 11.5–15.5)
WBC: 9.7 10*3/uL (ref 4.0–10.5)

## 2014-05-10 LAB — GLUCOSE, CAPILLARY
GLUCOSE-CAPILLARY: 104 mg/dL — AB (ref 70–99)
GLUCOSE-CAPILLARY: 190 mg/dL — AB (ref 70–99)
GLUCOSE-CAPILLARY: 199 mg/dL — AB (ref 70–99)
Glucose-Capillary: 194 mg/dL — ABNORMAL HIGH (ref 70–99)

## 2014-05-10 MED ORDER — DULOXETINE HCL 60 MG PO CPEP
60.0000 mg | ORAL_CAPSULE | Freq: Two times a day (BID) | ORAL | Status: DC
  Administered 2014-05-10 – 2014-05-14 (×9): 60 mg via ORAL
  Filled 2014-05-10 (×10): qty 1

## 2014-05-10 MED ORDER — INSULIN ASPART 100 UNIT/ML ~~LOC~~ SOLN
0.0000 [IU] | Freq: Three times a day (TID) | SUBCUTANEOUS | Status: DC
  Administered 2014-05-10 (×2): 2 [IU] via SUBCUTANEOUS
  Administered 2014-05-11 (×2): 1 [IU] via SUBCUTANEOUS
  Administered 2014-05-12: 2 [IU] via SUBCUTANEOUS
  Administered 2014-05-13: 1 [IU] via SUBCUTANEOUS
  Administered 2014-05-14: 3 [IU] via SUBCUTANEOUS

## 2014-05-10 MED ORDER — GABAPENTIN 400 MG PO CAPS
800.0000 mg | ORAL_CAPSULE | Freq: Three times a day (TID) | ORAL | Status: DC
  Administered 2014-05-10 – 2014-05-14 (×14): 800 mg via ORAL
  Filled 2014-05-10 (×15): qty 2

## 2014-05-10 MED ORDER — HYDROCORTISONE NA SUCCINATE PF 100 MG IJ SOLR
50.0000 mg | Freq: Two times a day (BID) | INTRAMUSCULAR | Status: DC
Start: 2014-05-10 — End: 2014-05-11
  Administered 2014-05-10 (×2): 50 mg via INTRAVENOUS
  Filled 2014-05-10 (×5): qty 1

## 2014-05-10 MED ORDER — HYDROCODONE-ACETAMINOPHEN 10-325 MG PO TABS
1.0000 | ORAL_TABLET | Freq: Four times a day (QID) | ORAL | Status: DC | PRN
  Administered 2014-05-11 – 2014-05-14 (×9): 1 via ORAL
  Filled 2014-05-10 (×9): qty 1

## 2014-05-10 MED ORDER — POTASSIUM CHLORIDE CRYS ER 20 MEQ PO TBCR
40.0000 meq | EXTENDED_RELEASE_TABLET | Freq: Four times a day (QID) | ORAL | Status: AC
  Administered 2014-05-10 – 2014-05-11 (×3): 40 meq via ORAL
  Filled 2014-05-10 (×3): qty 2

## 2014-05-10 MED ORDER — TIZANIDINE HCL 4 MG PO TABS
4.0000 mg | ORAL_TABLET | Freq: Four times a day (QID) | ORAL | Status: DC | PRN
  Administered 2014-05-12 – 2014-05-14 (×5): 4 mg via ORAL
  Filled 2014-05-10 (×7): qty 1

## 2014-05-10 MED ORDER — PANTOPRAZOLE SODIUM 40 MG PO TBEC
40.0000 mg | DELAYED_RELEASE_TABLET | Freq: Every day | ORAL | Status: DC
Start: 2014-05-10 — End: 2014-05-14
  Administered 2014-05-10 – 2014-05-14 (×5): 40 mg via ORAL
  Filled 2014-05-10 (×5): qty 1

## 2014-05-10 NOTE — Evaluation (Signed)
Occupational Therapy Evaluation Patient Details Name: [name] Carr MRN: [id number] DOB: 16-May-1955 Today's Date: 05/10/2014    History of Present Illness s/p L thigh abscess post I and D.  Pt with history of thigh I and D, lupus, chronic pain and Breast cancer   Clinical Impression   Pt admitted with L thigh infection. Pt currently with functional limitations due to the deficits listed below (see OT Problem List). Pt will benefit from skilled OT to increase their safety and independence with ADL and functional mobility for ADL to facilitate discharge to venue listed below.      Follow Up Recommendations  SNF    Equipment Recommendations  None recommended by OT    Recommendations for Other Services       Precautions / Restrictions Precautions Precautions: Fall      Mobility Bed Mobility Overal bed mobility: Needs Assistance Bed Mobility: Supine to Sit     Supine to sit: Min guard        Transfers Overall transfer level: Needs assistance Equipment used: 1 person hand held assist Transfers: Sit to/from [facility] Sit to Stand: Min assist         General transfer comment: VC for safety and encouragement         ADL Overall ADL's : Needs assistance/impaired Eating/Feeding: Set up;Sitting   Grooming: Sitting;Set up               Lower Body Dressing: Minimal assistance;Sit to/from stand   Toilet Transfer: Minimal assistance;BSC;Cueing for sequencing;Cueing for safety;Stand-pivot   Toileting- Clothing Manipulation and Hygiene: Minimal assistance;Cueing for sequencing;Cueing for safety;Sit to/from stand         General ADL Comments: pt needed increased time and encouragement.  Pt told OT she wanted out of bed then told RN OT made her get out of bed. Pt will need SNF. Pt did state [facility] would not take her as a patient again.  Pt with many complaints     Vision     Perception     Praxis      Pertinent Vitals/Pain Pain  Assessment: 0-10 Pain Location: L thigh Pain Descriptors / Indicators: Sore Pain Intervention(s): Limited activity within patient's tolerance;Patient requesting pain meds-RN notified     Hand Dominance     Extremity/Trunk Assessment Upper Extremity Assessment Upper Extremity Assessment: Generalized weakness           Communication Communication Communication: No difficulties   Cognition Arousal/Alertness: Awake/alert Behavior During Therapy: WFL for tasks assessed/performed Overall Cognitive Status: Within Functional Limits for tasks assessed                     General Comments       Exercises       Shoulder Instructions      Home Living Family/patient expects to be discharged to:: Private residence Living Arrangements: Alone Available Help at Discharge: Friend(s);Available PRN/intermittently Type of Home: House Home Access: Stairs to enter [employer] of Steps: 5 Entrance Stairs-Rails: Right Home Layout: One level     Bathroom Shower/Tub: Tub/shower unit         Home Equipment: Environmental consultant - 2 wheels;Walker - 4 wheels;Cane - single point;Bedside commode;Shower seat;Electric scooter          Prior Functioning/Environment Level of Independence: Independent with assistive device(s)             OT Diagnosis: Generalized weakness   OT Problem List: Decreased strength;Decreased activity tolerance;Decreased safety awareness   OT  Treatment/Interventions: Self-care/ADL training;DME and/or AE instruction;Balance training;Patient/family education    OT Goals(Current goals can be found in the care plan section) Acute Rehab OT Goals Patient Stated Goal: to go home but they are saying i have to go to rehab OT Goal Formulation: With patient Time For Goal Achievement: 05/24/14 Potential to Achieve Goals: Good ADL Goals Pt Will Perform Grooming: with supervision;standing Pt Will Perform Lower Body Dressing: with supervision;sit to/from  stand Pt Will Transfer to Toilet: with supervision;ambulating;regular height toilet Pt Will Perform Toileting - Clothing Manipulation and hygiene: with supervision;sit to/from stand  OT Frequency: Min 2X/week   Barriers to D/C:               End of Session Nurse Communication: Mobility status  Activity Tolerance: Patient tolerated treatment well Patient left: in chair;with nursing/sitter in room;with family/visitor present;with call bell/phone within reach   Time: 1357-1423 OT Time Calculation (min): 26 min Charges:  OT General Charges $OT Visit: 1 Procedure OT Evaluation $Initial OT Evaluation Tier I: 1 Procedure OT Treatments $Self Care/Home Management : 8-22 mins G-Codes:    Payton Mccallum D 06-08-2014, 2:35 PM

## 2014-05-10 NOTE — Progress Notes (Signed)
Patient ID: [name] Carr, female   DOB: November 05, 1955, 59 y.o.   MRN: [id number]         [facility]    Date of Admission:  05/08/2014   Total days of antibiotics 2          Principal Problem:   Abscess of left thigh Active Problems:   Mycobacterium fortuitum infection   Chronic pain syndrome   SLE-on prednisone-Follow at [facility]   Diabetes mellitus type 2, controlled   Infection   . azithromycin  500 mg Intravenous QHS  . Chlorhexidine Gluconate Cloth  6 each Topical Q0600  . clonazePAM  1 mg Oral BID  . doxycycline (VIBRAMYCIN) IV  100 mg Intravenous BID  . enoxaparin (LOVENOX) injection  40 mg Subcutaneous Q24H  . fentaNYL  25 mcg Transdermal Q72H  . hydrocortisone sod succinate (SOLU-CORTEF) inj  50 mg Intravenous 3 times per day  . insulin aspart  0-9 Units Subcutaneous TID WC  . mupirocin ointment  1 application Nasal BID  . rOPINIRole  1 mg Oral QHS  . traZODone  150-300 mg Oral QHS    Subjective: She states that her left thigh is sore after yesterday's surgery. She tells me again that she could not tolerate her oral antibiotics because of nausea and vomiting. She does not have trouble tolerating other oral medications. Her nurse told me that she has not had any problems taking oral medications or food here. [name] Carr tells me that she spoke to a CEO at  [facility] could not work with her because she was abusing drugs. My nurse was also recently told something similar by a nurse at [place].   Review of Systems: Pertinent items are noted in HPI.  Past Medical History  Diagnosis Date  . PERSONALITY DISORDER   . Chronic pain syndrome     chronic narcotics, dependancy hx - [facility] for pain mgmt  . MIGRAINE HEADACHE   . Lyme disease   . ALLERGIC RHINITIS   . ANEMIA-IRON DEFICIENCY   . HYPERLIPIDEMIA   . GERD   . DIABETES MELLITUS, TYPE II   . DEPRESSION   . SLE (systemic lupus erythematosus)     [place] - chronic  pred  . Psoriasis   . Addison's disease   . Degenerative joint disease   . DDD (degenerative disc disease)   . Phlebitis after infusion     left ?calf; "S/P phenergan/demerol injection"  . Blood clot in vein 2011; 02/04/11    "right jugular vein;left jugular vein"  . SEIZURE DISORDER     "lots of seizures; due to lupus"  . Anxiety   . Neuropathy     hands, feet, due to diabetes & back problem  . Surgical wound infection 02/15/2011    R shoulder and mastectomy site - related to PICC  . CARCINOMA, BREAST 08/2009 dx    R breast,  mets to liver - resolution s/p chemo, s/p B mastect  . Metastases to the liver   . RA (rheumatoid arthritis)   . Hypothyroidism pt states she has never had hy pothyroidism  . Hypertension     History  Substance Use Topics  . Smoking status: Never Smoker   . Smokeless tobacco: Never Used     Comment: Never used tobacco  . Alcohol Use: No    Family History  Problem Relation Age of Onset  . Lung cancer Mother   . Lung cancer Father   .  Arthritis Other   . Diabetes Other   . Hyperlipidemia Other    Allergies  Allergen Reactions  . Ammonia Other (See Comments)    Ended up on ventilator from ammonia tabs  . Contrast Media [Iodinated Diagnostic Agents] Other (See Comments)    Doesn't breath well.  . Iohexol Other (See Comments)    SOB   . Methotrexate Anaphylaxis  . Midazolam Hcl Anaphylaxis  . Nalbuphine Other (See Comments)    Can't breathe well  . Infliximab Nausea And Vomiting  . Ketorolac Tromethamine Other (See Comments)    Injectable doesn't work and pill hurts stomach.  . Morphine And Related Hives  . Nsaids Nausea And Vomiting  . Celecoxib Rash    OBJECTIVE: Blood pressure 102/50, pulse 65, temperature 97.4 F (36.3 C), temperature source Oral, resp. rate 20, height 5\' 5"  (1.651 m), weight 170 lb 1.6 oz (77.157 kg), SpO2 94 %. General:  she is very groggy from pain medication. She is eating breakfast without difficulty  left thigh  wrapped  Lab Results Lab Results  Component Value Date   WBC 9.7 05/10/2014   HGB 10.4* 05/10/2014   HCT 32.6* 05/10/2014   MCV 93.4 05/10/2014   PLT 233 05/10/2014    Lab Results  Component Value Date   CREATININE 0.97 05/10/2014   BUN 11 05/10/2014   NA 140 05/10/2014   K 3.2* 05/10/2014   CL 96 05/10/2014   CO2 28 05/10/2014    Lab Results  Component Value Date   ALT 18 05/09/2014   AST 21 05/09/2014   ALKPHOS 101 05/09/2014   BILITOT 0.4 05/09/2014     Microbiology: Recent Results (from the past 240 hour(s))  Surgical pcr screen     Status: Abnormal   Collection Time: 05/08/14 11:29 PM  Result Value Ref Range Status   MRSA, PCR POSITIVE (A) NEGATIVE Final    Comment: RESULT CALLED TO, READ BACK BY AND VERIFIED WITH: [name] Carr,A/5E @0224  ON 05/09/14 BY KARCZEWSKI,S.    Staphylococcus aureus POSITIVE (A) NEGATIVE Final    Comment:        The Xpert SA Assay (FDA approved for NASAL specimens in patients over 47 years of age), is one component of a comprehensive surveillance program.  Test performance has been validated by [facility] for patients greater than or equal to 4 year old. It is not intended to diagnose infection nor to guide or monitor treatment.   Anaerobic culture     Status: None (Preliminary result)   Collection Time: 05/09/14  4:31 PM  Result Value Ref Range Status   Specimen Description ABSCESS LEFT THIGH  Final   Special Requests PATIENT ON FOLLOWING DOXYCYCLINE  Final   Gram Stain   Final    NO WBC SEEN NO SQUAMOUS EPITHELIAL CELLS SEEN NO ORGANISMS SEEN Performed at [facility]    Culture   Final    NO ANAEROBES ISOLATED; CULTURE IN PROGRESS FOR 5 DAYS Performed at [facility]    Report Status PENDING  Incomplete  Gram stain     Status: None   Collection Time: 05/09/14  4:31 PM  Result Value Ref Range Status   Specimen Description ABSCESS LEFT THIGH  Final   Special Requests NONE  Final   Gram Stain   Final      FEW WBC PRESENT, PREDOMINANTLY PMN NO ORGANISMS SEEN Gram Stain Report Called to,Read Back By and Verified With: [name] Carr,L. RN @1755  ON 2.9.92 BY MCCOY,N.    Report  Status 05/09/2014 FINAL  Final  Culture, routine-abscess     Status: None (Preliminary result)   Collection Time: 05/09/14  4:31 PM  Result Value Ref Range Status   Specimen Description ABSCESS LEFT THIGH  Final   Special Requests PATIENT ON FOLLOWING DOXYCYCLINE  Final   Gram Stain   Final    NO WBC SEEN NO SQUAMOUS EPITHELIAL CELLS SEEN NO ORGANISMS SEEN Performed at [facility]    Culture PENDING  Incomplete   Report Status PENDING  Incomplete    Assessment: I would not treat her with outpatient IV antibiotics through a PICC because of the concerns raised by local home health agencies. I also seriously doubt that she really has difficulty with all oral antibiotics but can take other oral medications without difficulty. I suggested to her that she consider SNF placement or a jejunostomy tube for her medications.   Plan: 1. Continue current antibiotics pending cultures  2.  I will follow-up on Monday  Michel Bickers, MD [facility] for [place] [facility] [phone] pager   [phone] cell 05/10/2014, 10:50 AM

## 2014-05-10 NOTE — Progress Notes (Signed)
Sitting up eating breakfast.  C/o thigh soreness.  Stable clinically.  Dressing c/d/i.  Cultures pending.  Continue abx.  Azucena Cecil, MD [place] 9:31 AM

## 2014-05-10 NOTE — Op Note (Signed)
[name], [name] Carr NO.:  [id number]  MEDICAL RECORD NO.:  [id number]  LOCATION:  2297                         FACILITY:  [facility]  PHYSICIAN:  Anderson Malta, M.D.    DATE OF BIRTH:  1955-09-07  DATE OF PROCEDURE: DATE OF DISCHARGE:                              OPERATIVE REPORT   PREOPERATIVE DIAGNOSIS:  Left thigh abscess.  POSTOPERATIVE DIAGNOSIS:  Left thigh infection with mycobacterium.  PROCEDURE:  Left thigh excisional debridement.  SURGEON:  Anderson Malta, M.D.  ASSISTANT:  None.  ANESTHESIA:  General.  INDICATIONS:  The patient with left thigh draining wound from General Surgical procedure last year, presents now for operative management of worsening infection.  PROCEDURE IN DETAIL:  The patient was brought to the operating room where general anesthetic was induced.  Time-out was called. Perioperative antibiotics were maintained.  Left thigh was prepped with DuraPrep solution and draped in sterile manner.  Draining sinus was present, which was included in the longitudinal incision, which extended about 15 cm.  Skin and subcutaneous tissue were sharply divided. Multiple caseating granulomas were present within the subdural tissue along with the calcified areas, these were removed and sent for culture. There was no direct path of this draining sinus to any tissue below the fascia.  Excisional debridement was performed of all caseating-type granulomatous tissues.  These areas were excised.  This tissue was also sent for culture.  Gram stain was also obtained and there were no organisms in the Gram stain.  Following excisional debridement, the fascia was incised, fascia was highly thickened.  Underlying muscle appeared viable.  There was no fluid collection present and this was mobilized medially as well.  Again, no fluid pocket was encountered.  At this time, thorough irrigation with 6 liters of irrigating solution was performed both above and  below the fascial incision.  Stimulan antibiotic bead with tobramycin and vancomycin were prepared and then placed at both levels.  The fascia was loosely reapproximated with #1 Vicryl suture, skin was closed using interrupted inverted 0 Vicryl suture, 2-0 Vicryl suture and 3-0 nylon.  Bulky dressing and Ace wrap were applied.  The patient tolerated the procedure well without immediate complication.  Transferred to the recovery room in stable condition.     Anderson Malta, M.D.     GSD/MEDQ  D:  05/09/2014  T:  05/10/2014  Job:  [id number]

## 2014-05-10 NOTE — Progress Notes (Signed)
PT Cancellation Note  Patient Details Name: [name] Carr MRN: [id number] DOB: 1955-12-14   Cancelled Treatment:    Reason Eval/Treat Not Completed: Patient declined, no reason specified. Checked on pt earlier and when knocked pt on the phone and did not want to get off, stating " oh that PT , don't know why they are here". Checked back after an hour and pt still on phone not acknowledging me at the door, and not getting of the phone.  Will check on pt tomorrow.     Clide Dales 05/10/2014, 4:11 PM  Clide Dales, PT Pager: [phone] 05/10/2014

## 2014-05-10 NOTE — Progress Notes (Signed)
[facility] PROGRESS NOTE  [name] Carr OJJ:[id number] DOB: 1955-10-05 DOA: 05/08/2014 PCP: [name], MD  Assessment/Plan: 1. Left thigh abscess -Patient with a history of deep soft tissue abscess of left thigh, admitted back in November 2015 undergoing incision and drainage on 12/22/2013 as cultures grew Mycobacterium fortuitum. -At the time she was discharged on Ceftin orally and 600 mg IV twice a day after PICC line placement. She had been following Dr. Megan Salon of infectious disease at the clinic. -She now presents with recurrent left thigh abscess as demonstrated by CT scan. Orthopedic surgery has been consulted. Plan for excisional debridement in the next 24 hours -Case discussed with active Megan Salon, recommended continuing empiric IV antibiotic therapy with azithromycin and doxycycline -Status post left thigh excisional debridement, procedure performed on 05/10/2014, tissue cultures are pending.  2.  SLE -She was placed on stress doses of hydrocortisone, will decrease dose to 50 mg IV twice a day today  3.  Hypokalemia -A.m. labs showing potassium of 3.2, received 40 mEq of IV potassium chloride yesterday. -Will replace with KDur 40 mEq by mouth every 6 hours 3 doses  4.  Polypharmacy/drug-seeking behavior -Held extensive discussion with patient regarding multiple psychotropic medications. She reports having a "tolerance" to narcotic analgesics. -I spoke to Dr. Megan Salon of infectious disease, patient having a long history of drug-seeking behavior and PICC line manipulation. In the past she has reported intolerance to oral antimicrobial therapy and has made specific requests for PICC line placement, with providers noticing no issues taking other oral meds and her being lost to follow-up. Multiple phone messages left on Epic by her providers. Dr. Megan Salon did not recommend discharging her on a PICC line and that oral antimicrobial therapy should be prescribed. -Will  restart Norco 10/325 with plans to taper off IV Dilaudid.   Code Status: Full code Family Communication: Family not present Disposition Plan: Anticipate discharge home when medically stable   Consultants:  Infectious disease  Orthopedic surgery   Antibiotics:  Doxycycline  Azithromycin  HPI/Subjective: Patient is a pleasant 59 year old female with a past medical history of left thigh infection status post incision and drainage in November 2015 was admitted to medicine service on 05/08/2014 presenting with complaints of left thigh pain and swelling. He had a CT scan of her left thigh ordered by her primary care provider that showed recurrent deep soft tissue abscess in the vastus lateralis and rectus for Morris. Abscess demarcated by fluid and gas within the anterior lateral musculature of the left thigh. She was instructed by her primary care provider to present to the emergency department. Patient was seen and evaluated by orthopedic surgery as she will likely be taken to the operating room for extensive excisional debridement within the next 24 hours. Patient remains nothing by mouth, was started on empiric IV antibiotic therapy with doxycycline and a azithromycin. Patient is well-known to Dr. Megan Salon of infectious disease. I spoke with Dr. Megan Salon regarding susceptibility testing of organism isolated from cultures back in November 2015.  Objective: Filed Vitals:   05/10/14 0513  BP: 102/50  Pulse: 65  Temp: 97.4 F (36.3 C)  Resp: 20    Intake/Output Summary (Last 24 hours) at 05/10/14 1350 Last data filed at 05/10/14 0855  Gross per 24 hour  Intake 2727.5 ml  Output    850 ml  Net 1877.5 ml   Filed Weights   05/08/14 2302  Weight: 77.157 kg (170 lb 1.6 oz)    Exam:   General:  Patient is  in no acute distress, she is awake, alert, mentating well  Cardiovascular: Regular rate and rhythm normal S1-S2 no murmurs rubs or gallops  Respiratory: Normal respiratory  effort, lungs are clear to auscultation  Abdomen: Soft nontender nondistended positive bowel sounds  Musculoskeletal: Patient having swelling over left thigh region, status post incision and drainage of left thigh abscess  Data Reviewed: Basic Metabolic Panel:  Recent Labs Lab 05/07/14 1459 05/08/14 1958 05/09/14 0600 05/10/14 0440  NA 136 137 138 140  K 3.5 3.1* 3.1* 3.2*  CL 93* 96 99 96  CO2 29 30 28 28   GLUCOSE 130* 105* 147* 105*  BUN 12 14 12 11   CREATININE 0.90 0.88 0.93 0.97  CALCIUM 10.2 9.1 9.4 8.8   Liver Function Tests:  Recent Labs Lab 05/07/14 1459 05/08/14 1958 05/09/14 0600  AST 16 19 21   ALT 15 18 18   ALKPHOS 113 97 101  BILITOT 0.7 0.3 0.4  PROT 8.3 7.7 8.1  ALBUMIN 4.7 4.1 4.2   No results for input(s): LIPASE, AMYLASE in the last 168 hours. No results for input(s): AMMONIA in the last 168 hours. CBC:  Recent Labs Lab 05/07/14 1457 05/08/14 1958 05/09/14 0600 05/10/14 0440  WBC 11.8* 11.4* 12.4* 9.7  NEUTROABS 10.5* 9.1* 11.0*  --   HGB 13.9 11.8* 12.6 10.4*  HCT 42.7 36.2 40.6 32.6*  MCV 91 91.9 93.5 93.4  PLT 260 248 271 233   Cardiac Enzymes: No results for input(s): CKTOTAL, CKMB, CKMBINDEX, TROPONINI in the last 168 hours. BNP (last 3 results) No results for input(s): BNP in the last 8760 hours.  ProBNP (last 3 results) No results for input(s): PROBNP in the last 8760 hours.  CBG:  Recent Labs Lab 05/09/14 0815 05/09/14 1150 05/09/14 1955 05/10/14 0727 05/10/14 1145  GLUCAP 148* 172* 99 104* 194*    Recent Results (from the past 240 hour(s))  Surgical pcr screen     Status: Abnormal   Collection Time: 05/08/14 11:29 PM  Result Value Ref Range Status   MRSA, PCR POSITIVE (A) NEGATIVE Final    Comment: RESULT CALLED TO, READ BACK BY AND VERIFIED WITH: MERRITT,A/5E @0224  ON 05/09/14 BY KARCZEWSKI,S.    Staphylococcus aureus POSITIVE (A) NEGATIVE Final    Comment:        The Xpert SA Assay (FDA approved for  NASAL specimens in patients over 58 years of age), is one component of a comprehensive surveillance program.  Test performance has been validated by [facility] for patients greater than or equal to 76 year old. It is not intended to diagnose infection nor to guide or monitor treatment.   Anaerobic culture     Status: None (Preliminary result)   Collection Time: 05/09/14  4:31 PM  Result Value Ref Range Status   Specimen Description ABSCESS LEFT THIGH  Final   Special Requests PATIENT ON FOLLOWING DOXYCYCLINE  Final   Gram Stain   Final    NO WBC SEEN NO SQUAMOUS EPITHELIAL CELLS SEEN NO ORGANISMS SEEN Performed at [facility]    Culture   Final    NO ANAEROBES ISOLATED; CULTURE IN PROGRESS FOR 5 DAYS Performed at [facility]    Report Status PENDING  Incomplete  Gram stain     Status: None   Collection Time: 05/09/14  4:31 PM  Result Value Ref Range Status   Specimen Description ABSCESS LEFT THIGH  Final   Special Requests NONE  Final   Gram Stain   Final  FEW WBC PRESENT, PREDOMINANTLY PMN NO ORGANISMS SEEN Gram Stain Report Called to,Read Back By and Verified With: LITTLE,L. RN @1755  ON 4.1.16 BY MCCOY,N.    Report Status 05/09/2014 FINAL  Final  Culture, routine-abscess     Status: None (Preliminary result)   Collection Time: 05/09/14  4:31 PM  Result Value Ref Range Status   Specimen Description ABSCESS LEFT THIGH  Final   Special Requests PATIENT ON FOLLOWING DOXYCYCLINE  Final   Gram Stain   Final    NO WBC SEEN NO SQUAMOUS EPITHELIAL CELLS SEEN NO ORGANISMS SEEN Performed at [facility]    Culture PENDING  Incomplete   Report Status PENDING  Incomplete  Tissue culture     Status: None (Preliminary result)   Collection Time: 05/09/14  4:31 PM  Result Value Ref Range Status   Specimen Description ABSCESS LEFT THIGH  Final   Special Requests PATIENT ON FOLLOWING DOXYCUCLINE  Final   Gram Stain   Final    RARE WBC PRESENT,  PREDOMINANTLY MONONUCLEAR NO ORGANISMS SEEN Performed at [facility]    Culture PENDING  Incomplete   Report Status PENDING  Incomplete     Studies: No results found.  Scheduled Meds: . azithromycin  500 mg Intravenous QHS  . Chlorhexidine Gluconate Cloth  6 each Topical Q0600  . clonazePAM  1 mg Oral BID  . doxycycline (VIBRAMYCIN) IV  100 mg Intravenous BID  . DULoxetine  60 mg Oral BID  . enoxaparin (LOVENOX) injection  40 mg Subcutaneous Q24H  . fentaNYL  25 mcg Transdermal Q72H  . gabapentin  800 mg Oral TID  . hydrocortisone sod succinate (SOLU-CORTEF) inj  50 mg Intravenous BID  . insulin aspart  0-9 Units Subcutaneous TID WC  . mupirocin ointment  1 application Nasal BID  . pantoprazole  40 mg Oral Daily  . rOPINIRole  1 mg Oral QHS  . traZODone  150-300 mg Oral QHS   Continuous Infusions:    Principal Problem:   Abscess of left thigh Active Problems:   Chronic pain syndrome   SLE-on prednisone-Follow at [facility]   Mycobacterium fortuitum infection   Diabetes mellitus type 2, controlled   Infection    Time spent: 35 min    Kelvin Cellar  [facility] Pager [phone]. If 7PM-7AM, please contact night-coverage at [email or web address], password [facility] 05/10/2014, 1:50 PM  LOS: 2 days

## 2014-05-11 LAB — CBC
HCT: 28.7 % — ABNORMAL LOW (ref 36.0–46.0)
Hemoglobin: 8.9 g/dL — ABNORMAL LOW (ref 12.0–15.0)
MCH: 29.2 pg (ref 26.0–34.0)
MCHC: 31 g/dL (ref 30.0–36.0)
MCV: 94.1 fL (ref 78.0–100.0)
Platelets: 208 10*3/uL (ref 150–400)
RBC: 3.05 MIL/uL — ABNORMAL LOW (ref 3.87–5.11)
RDW: 14.1 % (ref 11.5–15.5)
WBC: 8.3 10*3/uL (ref 4.0–10.5)

## 2014-05-11 LAB — BASIC METABOLIC PANEL
ANION GAP: 8 (ref 5–15)
BUN: 17 mg/dL (ref 6–23)
CO2: 31 mmol/L (ref 19–32)
Calcium: 9 mg/dL (ref 8.4–10.5)
Chloride: 101 mmol/L (ref 96–112)
Creatinine, Ser: 0.8 mg/dL (ref 0.50–1.10)
GFR calc Af Amer: >90 mL/min (ref >90)
GFR, EST NON AFRICAN AMERICAN: 79 mL/min — AB (ref >90)
Glucose, Bld: 159 mg/dL — ABNORMAL HIGH (ref 70–99)
POTASSIUM: 4 mmol/L (ref 3.5–5.1)
SODIUM: 140 mmol/L (ref 135–145)

## 2014-05-11 LAB — GLUCOSE, CAPILLARY
Glucose-Capillary: 120 mg/dL — ABNORMAL HIGH (ref 70–99)
Glucose-Capillary: 145 mg/dL — ABNORMAL HIGH (ref 70–99)
Glucose-Capillary: 129 mg/dL — ABNORMAL HIGH (ref 70–99)
Glucose-Capillary: 163 mg/dL — ABNORMAL HIGH (ref 70–99)

## 2014-05-11 MED ORDER — HYDROMORPHONE HCL 1 MG/ML IJ SOLN
1.0000 mg | INTRAMUSCULAR | Status: DC | PRN
  Administered 2014-05-11 – 2014-05-13 (×17): 1 mg via INTRAVENOUS
  Filled 2014-05-11 (×18): qty 1

## 2014-05-11 MED ORDER — PREDNISONE 10 MG PO TABS
10.0000 mg | ORAL_TABLET | Freq: Every day | ORAL | Status: DC
  Administered 2014-05-11 – 2014-05-14 (×4): 10 mg via ORAL
  Filled 2014-05-11 (×5): qty 1

## 2014-05-11 MED ORDER — POLYETHYLENE GLYCOL 3350 17 G PO PACK
17.0000 g | PACK | Freq: Once | ORAL | Status: AC
  Administered 2014-05-11: 17 g via ORAL
  Filled 2014-05-11: qty 1

## 2014-05-11 MED ORDER — DOCUSATE SODIUM 100 MG PO CAPS
100.0000 mg | ORAL_CAPSULE | Freq: Once | ORAL | Status: AC
  Administered 2014-05-11: 100 mg via ORAL
  Filled 2014-05-11: qty 1

## 2014-05-11 NOTE — Evaluation (Signed)
Physical Therapy Evaluation Patient Details Name: [name] Carr MRN: [id number] DOB: 07-Mar-1955 Today's Date: 05/11/2014   History of Present Illness  s/p L thigh abscess post I and D.  Pt with history of thigh I and D, lupus, chronic pain and Breast cancer  Clinical Impression  On eval, pt required Min assist for mobility-able to ambulate ~15 feet with RW. Mobility limited by pain. Pt declined ambulation outside of room. At this time, recommend SNF for rehab unless mobility improves significantly.     Follow Up Recommendations SNF (unless mobility improves significantly)    Equipment Recommendations  None recommended by PT    Recommendations for Other Services OT consult     Precautions / Restrictions Precautions Precautions: Fall Restrictions Weight Bearing Restrictions: No      Mobility  Bed Mobility Overal bed mobility: Needs Assistance Bed Mobility: Supine to Sit;Sit to Supine     Supine to sit: Min guard Sit to supine: Min guard   General bed mobility comments: close guard for safety  Transfers Overall transfer level: Needs assistance Equipment used: Rolling walker (2 wheeled) Transfers: Sit to/from Stand Sit to Stand: Min guard         General transfer comment: close guard for safety VCs hand placement.  Ambulation/Gait Ambulation/Gait assistance: Min assist Ambulation Distance (Feet): 15 Feet (x2) Assistive device: Rolling walker (2 wheeled)       General Gait Details: VCS safety, technique, sequence. slow gait speed. difficulty WBing and advancing L LE at times. Walked short distance to/from bathroom with RW. Pt declined to ambulate in hallway  Stairs            Wheelchair Mobility    Modified Rankin (Stroke Patients Only)       Balance Overall balance assessment: Needs assistance         Standing balance support: Bilateral upper extremity supported;During functional activity Standing balance-Leahy Scale: Poor                                Pertinent Vitals/Pain Pain Assessment: 0-10 Pain Score: 9  Pain Location: back, L thigh Pain Descriptors / Indicators: Aching;Sore Pain Intervention(s): Repositioned;Limited activity within patient's tolerance;Patient requesting pain meds-RN notified    Home Living Family/patient expects to be discharged to:: Private residence Living Arrangements: Alone Available Help at Discharge: Friend(s);Available PRN/intermittently Type of Home: House Home Access: Stairs to enter Entrance Stairs-Rails: Right Entrance Stairs-Number of Steps: 5 Home Layout: One level Home Equipment: Walker - 2 wheels;Walker - 4 wheels;Cane - single point;Bedside commode;Shower seat;Electric scooter      Prior Function Level of Independence: Independent with assistive device(s)         Comments: pt indicates using, cane, 4WW, electric scooter, or nothing for mobility.       Hand Dominance        Extremity/Trunk Assessment   Upper Extremity Assessment: Defer to OT evaluation           Lower Extremity Assessment: Generalized weakness;LLE deficits/detail   LLE Deficits / Details: difficulty WBing and advancing L LE during gait  Cervical / Trunk Assessment: Normal  Communication   Communication: No difficulties  Cognition Arousal/Alertness: Awake/alert Behavior During Therapy: WFL for tasks assessed/performed Overall Cognitive Status: Within Functional Limits for tasks assessed                      General Comments      Exercises  Assessment/Plan    PT Assessment Patient needs continued PT services  PT Diagnosis Difficulty walking;Abnormality of gait;Acute pain   PT Problem List Decreased balance;Decreased mobility;Decreased activity tolerance;Decreased knowledge of use of DME;Pain  PT Treatment Interventions DME instruction;Gait training;Functional mobility training;Therapeutic activities;Therapeutic exercise;Balance training;Patient/family  education   PT Goals (Current goals can be found in the Care Plan section) Acute Rehab PT Goals Patient Stated Goal: none stated PT Goal Formulation: With patient Time For Goal Achievement: 05/25/14 Potential to Achieve Goals: Good    Frequency Min 3X/week   Barriers to discharge        Co-evaluation               End of Session   Activity Tolerance: Patient limited by pain Patient left: with call bell/phone within reach           Time: 1414-1432 PT Time Calculation (min) (ACUTE ONLY): 18 min   Charges:   PT Evaluation $Initial PT Evaluation Tier I: 1 Procedure     PT G Codes:        Weston Anna, MPT Pager: [phone]

## 2014-05-11 NOTE — Progress Notes (Addendum)
[facility] PROGRESS NOTE  [name] Carr FGH:[id number] DOB: 12-07-1955 DOA: 05/08/2014 PCP: [name], MD  Assessment/Plan: 1. Left thigh abscess -Patient with a history of deep soft tissue abscess of left thigh, admitted back in November 2015 undergoing incision and drainage on 12/22/2013 as cultures grew Mycobacterium fortuitum. -At the time she was discharged on Ceftin orally and 600 mg IV twice a day after PICC line placement. She had been following Dr. Megan Salon of infectious disease at the clinic. -She now presents with recurrent left thigh abscess as demonstrated by CT scan. Orthopedic surgery has been consulted. Plan for excisional debridement in the next 24 hours -Case discussed with active Megan Salon, recommended continuing empiric IV antibiotic therapy with azithromycin and doxycycline, however on discharge will transition to oral antimicrobial therapy -Status post left thigh excisional debridement, procedure performed on 05/10/2014 -Tissue cultures are pending.  2.  SLE -Will discontinue Iv steroids and transition to oral Prednisone 10 mg PO q daily  3.  Hypokalemia -Labs showing potassium of 4.0 after replacement.   4.  Polypharmacy/drug-seeking behavior -Held extensive discussion with patient regarding multiple psychotropic medications. She reports having a "tolerance" to narcotic analgesics. -I spoke to Dr. Megan Salon of infectious disease, patient having a long history of drug-seeking behavior and PICC line manipulation. In the past she has reported intolerance to oral antimicrobial therapy and has made specific requests for PICC line placement, with providers noticing no issues taking other oral meds and her being lost to follow-up. Multiple phone messages left on Epic by her providers. Dr. Megan Salon did not recommend discharging her on a PICC line and that oral antimicrobial therapy should be prescribed. -Will decrease dilaudid to 1 mg IV q 2 hours PRN with plans to  stop IV narcotics in am.   Code Status: Full code Family Communication: Family not present Disposition Plan: Anticipate discharge home when medically stable   Consultants:  Infectious disease  Orthopedic surgery   Antibiotics:  Doxycycline  Azithromycin  HPI/Subjective: Patient is a pleasant 59 year old female with a past medical history of left thigh infection status post incision and drainage in November 2015 was admitted to medicine service on 05/08/2014 presenting with complaints of left thigh pain and swelling. He had a CT scan of her left thigh ordered by her primary care provider that showed recurrent deep soft tissue abscess in the vastus lateralis and rectus for Morris. Abscess demarcated by fluid and gas within the anterior lateral musculature of the left thigh. She was instructed by her primary care provider to present to the emergency department. Patient was seen and evaluated by orthopedic surgery as she will likely be taken to the operating room for extensive excisional debridement within the next 24 hours. Patient remains nothing by mouth, was started on empiric IV antibiotic therapy with doxycycline and a azithromycin. Patient is well-known to Dr. Megan Salon of infectious disease. I spoke with Dr. Megan Salon regarding susceptibility testing of organism isolated from cultures back in November 2015.  Objective: Filed Vitals:   05/11/14 0547  BP: 123/54  Pulse: 66  Temp: 97.2 F (36.2 C)  Resp: 18    Intake/Output Summary (Last 24 hours) at 05/11/14 1306 Last data filed at 05/11/14 0803  Gross per 24 hour  Intake    120 ml  Output      0 ml  Net    120 ml   Filed Weights   05/08/14 2302  Weight: 77.157 kg (170 lb 1.6 oz)    Exam:   General:  Patient  is in no acute distress, she is awake, alert, mentating well  Cardiovascular: Regular rate and rhythm normal S1-S2 no murmurs rubs or gallops  Respiratory: Normal respiratory effort, lungs are clear to  auscultation  Abdomen: Soft nontender nondistended positive bowel sounds  Musculoskeletal: Patient having swelling over left thigh region, status post incision and drainage of left thigh abscess  Data Reviewed: Basic Metabolic Panel:  Recent Labs Lab 05/07/14 1459 05/08/14 1958 05/09/14 0600 05/10/14 0440 05/11/14 0500  NA 136 137 138 140 140  K 3.5 3.1* 3.1* 3.2* 4.0  CL 93* 96 99 96 101  CO2 29 30 28 28 31   GLUCOSE 130* 105* 147* 105* 159*  BUN 12 14 12 11 17   CREATININE 0.90 0.88 0.93 0.97 0.80  CALCIUM 10.2 9.1 9.4 8.8 9.0   Liver Function Tests:  Recent Labs Lab 05/07/14 1459 05/08/14 1958 05/09/14 0600  AST 16 19 21   ALT 15 18 18   ALKPHOS 113 97 101  BILITOT 0.7 0.3 0.4  PROT 8.3 7.7 8.1  ALBUMIN 4.7 4.1 4.2   No results for input(s): LIPASE, AMYLASE in the last 168 hours. No results for input(s): AMMONIA in the last 168 hours. CBC:  Recent Labs Lab 05/07/14 1457 05/08/14 1958 05/09/14 0600 05/10/14 0440 05/11/14 0500  WBC 11.8* 11.4* 12.4* 9.7 8.3  NEUTROABS 10.5* 9.1* 11.0*  --   --   HGB 13.9 11.8* 12.6 10.4* 8.9*  HCT 42.7 36.2 40.6 32.6* 28.7*  MCV 91 91.9 93.5 93.4 94.1  PLT 260 248 271 233 208   Cardiac Enzymes: No results for input(s): CKTOTAL, CKMB, CKMBINDEX, TROPONINI in the last 168 hours. BNP (last 3 results) No results for input(s): BNP in the last 8760 hours.  ProBNP (last 3 results) No results for input(s): PROBNP in the last 8760 hours.  CBG:  Recent Labs Lab 05/10/14 1145 05/10/14 1624 05/10/14 2240 05/11/14 0729 05/11/14 1134  GLUCAP 194* 190* 199* 120* 145*    Recent Results (from the past 240 hour(s))  Surgical pcr screen     Status: Abnormal   Collection Time: 05/08/14 11:29 PM  Result Value Ref Range Status   MRSA, PCR POSITIVE (A) NEGATIVE Final    Comment: RESULT CALLED TO, READ BACK BY AND VERIFIED WITH: MERRITT,A/5E @0224  ON 05/09/14 BY KARCZEWSKI,S.    Staphylococcus aureus POSITIVE (A) NEGATIVE  Final    Comment:        The Xpert SA Assay (FDA approved for NASAL specimens in patients over 12 years of age), is one component of a comprehensive surveillance program.  Test performance has been validated by [facility] for patients greater than or equal to 38 year old. It is not intended to diagnose infection nor to guide or monitor treatment.   Anaerobic culture     Status: None (Preliminary result)   Collection Time: 05/09/14  4:31 PM  Result Value Ref Range Status   Specimen Description ABSCESS LEFT THIGH  Final   Special Requests PATIENT ON FOLLOWING DOXYCYCLINE  Final   Gram Stain   Final    NO WBC SEEN NO SQUAMOUS EPITHELIAL CELLS SEEN NO ORGANISMS SEEN Performed at [facility]    Culture   Final    NO ANAEROBES ISOLATED; CULTURE IN PROGRESS FOR 5 DAYS Performed at [facility]    Report Status PENDING  Incomplete  Gram stain     Status: None   Collection Time: 05/09/14  4:31 PM  Result Value Ref Range Status  Specimen Description ABSCESS LEFT THIGH  Final   Special Requests NONE  Final   Gram Stain   Final    FEW WBC PRESENT, PREDOMINANTLY PMN NO ORGANISMS SEEN Gram Stain Report Called to,Read Back By and Verified With: LITTLE,L. RN @1755  ON 4.1.16 BY MCCOY,N.    Report Status 05/09/2014 FINAL  Final  Culture, routine-abscess     Status: None (Preliminary result)   Collection Time: 05/09/14  4:31 PM  Result Value Ref Range Status   Specimen Description ABSCESS LEFT THIGH  Final   Special Requests PATIENT ON FOLLOWING DOXYCYCLINE  Final   Gram Stain   Final    NO WBC SEEN NO SQUAMOUS EPITHELIAL CELLS SEEN NO ORGANISMS SEEN Performed at [facility]    Culture   Final    Culture reincubated for better growth Performed at [facility]    Report Status PENDING  Incomplete  Tissue culture     Status: None (Preliminary result)   Collection Time: 05/09/14  4:31 PM  Result Value Ref Range Status   Specimen Description  ABSCESS LEFT THIGH  Final   Special Requests PATIENT ON FOLLOWING DOXYCUCLINE  Final   Gram Stain   Final    RARE WBC PRESENT, PREDOMINANTLY MONONUCLEAR NO ORGANISMS SEEN Performed at [facility]   Final    Culture reincubated for better growth Performed at [facility]    Report Status PENDING  Incomplete     Studies: No results found.  Scheduled Meds: . azithromycin  500 mg Intravenous QHS  . Chlorhexidine Gluconate Cloth  6 each Topical Q0600  . clonazePAM  1 mg Oral BID  . doxycycline (VIBRAMYCIN) IV  100 mg Intravenous BID  . DULoxetine  60 mg Oral BID  . enoxaparin (LOVENOX) injection  40 mg Subcutaneous Q24H  . fentaNYL  25 mcg Transdermal Q72H  . gabapentin  800 mg Oral TID  . insulin aspart  0-9 Units Subcutaneous TID WC  . mupirocin ointment  1 application Nasal BID  . pantoprazole  40 mg Oral Daily  . predniSONE  10 mg Oral Q breakfast  . rOPINIRole  1 mg Oral QHS  . traZODone  150-300 mg Oral QHS   Continuous Infusions:    Principal Problem:   Abscess of left thigh Active Problems:   Chronic pain syndrome   SLE-on prednisone-Follow at [facility]   Mycobacterium fortuitum infection   Diabetes mellitus type 2, controlled   Infection   Drug-seeking behavior    Time spent: 25 min    Kelvin Cellar  [facility] Pager [phone]. If 7PM-7AM, please contact night-coverage at [email or web address], password [facility] 05/11/2014, 1:06 PM  LOS: 3 days

## 2014-05-11 NOTE — Progress Notes (Signed)
Called on call MD per patient request and no changes in pain medications.

## 2014-05-11 NOTE — Progress Notes (Signed)
Patient has been picking at the dressing and the site where she had I and D. Instructed patient not to due to the fact that she has MRSA and only makes the site unable to heal if she picks at it. Redressed the site with ABD PAD and paper tape. No active bleeding at this time.

## 2014-05-12 ENCOUNTER — Encounter (HOSPITAL_COMMUNITY): Payer: Self-pay | Admitting: Orthopedic Surgery

## 2014-05-12 ENCOUNTER — Ambulatory Visit: Payer: Self-pay | Admitting: Internal Medicine

## 2014-05-12 DIAGNOSIS — B9561 Methicillin susceptible Staphylococcus aureus infection as the cause of diseases classified elsewhere: Secondary | ICD-10-CM

## 2014-05-12 LAB — GLUCOSE, CAPILLARY
GLUCOSE-CAPILLARY: 120 mg/dL — AB (ref 70–99)
Glucose-Capillary: 95 mg/dL (ref 70–99)
Glucose-Capillary: 170 mg/dL — ABNORMAL HIGH (ref 70–99)
Glucose-Capillary: 115 mg/dL — ABNORMAL HIGH (ref 70–99)
Glucose-Capillary: 129 mg/dL — ABNORMAL HIGH (ref 70–99)

## 2014-05-12 LAB — CULTURE, ROUTINE-ABSCESS: Gram Stain: NONE SEEN

## 2014-05-12 MED ORDER — PROMETHAZINE HCL 25 MG/ML IJ SOLN
6.2500 mg | Freq: Once | INTRAMUSCULAR | Status: AC
  Administered 2014-05-12: 6.25 mg via INTRAVENOUS
  Filled 2014-05-12: qty 1

## 2014-05-12 NOTE — Progress Notes (Signed)
Pt stable - cxs staph vanc tobra beads placed in incision Susceptibility pending wbat ble

## 2014-05-12 NOTE — Progress Notes (Signed)
Patient ID: [name] Carr, female   DOB: 05/21/1955, 59 y.o.   MRN: [id number]         [facility]    Date of Admission:  05/08/2014   Total days of antibiotics 3          Principal Problem:   Abscess of left thigh Active Problems:   Mycobacterium fortuitum infection   Chronic pain syndrome   SLE-on prednisone-Follow at [facility]   Diabetes mellitus type 2, controlled   Infection   Drug-seeking behavior   . azithromycin  500 mg Intravenous QHS  . Chlorhexidine Gluconate Cloth  6 each Topical Q0600  . clonazePAM  1 mg Oral BID  . doxycycline (VIBRAMYCIN) IV  100 mg Intravenous BID  . DULoxetine  60 mg Oral BID  . enoxaparin (LOVENOX) injection  40 mg Subcutaneous Q24H  . fentaNYL  25 mcg Transdermal Q72H  . gabapentin  800 mg Oral TID  . insulin aspart  0-9 Units Subcutaneous TID WC  . mupirocin ointment  1 application Nasal BID  . pantoprazole  40 mg Oral Daily  . predniSONE  10 mg Oral Q breakfast  . rOPINIRole  1 mg Oral QHS  . traZODone  150-300 mg Oral QHS    Subjective: Her nurse tells me that they have been concerned about self-induced emesis while here.  Review of Systems: Pertinent items are noted in HPI.  Past Medical History  Diagnosis Date  . PERSONALITY DISORDER   . Chronic pain syndrome     chronic narcotics, dependancy hx - [facility] for pain mgmt  . MIGRAINE HEADACHE   . Lyme disease   . ALLERGIC RHINITIS   . ANEMIA-IRON DEFICIENCY   . HYPERLIPIDEMIA   . GERD   . DIABETES MELLITUS, TYPE II   . DEPRESSION   . SLE (systemic lupus erythematosus)     [facility] - chronic pred  . Psoriasis   . Addison's disease   . Degenerative joint disease   . DDD (degenerative disc disease)   . Phlebitis after infusion     left ?calf; "S/P phenergan/demerol injection"  . Blood clot in vein 2011; 02/04/11    "right jugular vein;left jugular vein"  . SEIZURE DISORDER     "lots of seizures; due to lupus"  . Anxiety   .  Neuropathy     hands, feet, due to diabetes & back problem  . Surgical wound infection 02/15/2011    R shoulder and mastectomy site - related to PICC  . CARCINOMA, BREAST 08/2009 dx    R breast,  mets to liver - resolution s/p chemo, s/p B mastect  . Metastases to the liver   . RA (rheumatoid arthritis)   . Hypothyroidism pt states she has never had hy pothyroidism  . Hypertension     History  Substance Use Topics  . Smoking status: Never Smoker   . Smokeless tobacco: Never Used     Comment: Never used tobacco  . Alcohol Use: No    Family History  Problem Relation Age of Onset  . Lung cancer Mother   . Lung cancer Father   . Arthritis Other   . Diabetes Other   . Hyperlipidemia Other    Allergies  Allergen Reactions  . Ammonia Other (See Comments)    Ended up on ventilator from ammonia tabs  . Contrast Media [Iodinated Diagnostic Agents] Other (See Comments)    Doesn't breath well.  . Iohexol Other (See Comments)  SOB   . Methotrexate Anaphylaxis  . Midazolam Hcl Anaphylaxis  . Nalbuphine Other (See Comments)    Can't breathe well  . Infliximab Nausea And Vomiting  . Ketorolac Tromethamine Other (See Comments)    Injectable doesn't work and pill hurts stomach.  . Morphine And Related Hives  . Nsaids Nausea And Vomiting  . Celecoxib Rash    OBJECTIVE: Blood pressure 125/55, pulse 74, temperature 97.7 F (36.5 C), temperature source Oral, resp. rate 20, height 5\' 5"  (1.651 m), weight 170 lb 1.6 oz (77.157 kg), SpO2 97 %. General:  Before I entered the room I could see that she was talking on the phone. She appeared quite comfortable. As soon as she saw me she grabbed her left thigh and said how much it was hurting her. left thigh wrapped  Lab Results Lab Results  Component Value Date   WBC 8.3 05/11/2014   HGB 8.9* 05/11/2014   HCT 28.7* 05/11/2014   MCV 94.1 05/11/2014   PLT 208 05/11/2014    Lab Results  Component Value Date   CREATININE 0.80  05/11/2014   BUN 17 05/11/2014   NA 140 05/11/2014   K 4.0 05/11/2014   CL 101 05/11/2014   CO2 31 05/11/2014    Lab Results  Component Value Date   ALT 18 05/09/2014   AST 21 05/09/2014   ALKPHOS 101 05/09/2014   BILITOT 0.4 05/09/2014     Microbiology: Recent Results (from the past 240 hour(s))  Surgical pcr screen     Status: Abnormal   Collection Time: 05/08/14 11:29 PM  Result Value Ref Range Status   MRSA, PCR POSITIVE (A) NEGATIVE Final    Comment: RESULT CALLED TO, READ BACK BY AND VERIFIED WITH: MERRITT,A/5E @0224  ON 05/09/14 BY KARCZEWSKI,S.    Staphylococcus aureus POSITIVE (A) NEGATIVE Final    Comment:        The Xpert SA Assay (FDA approved for NASAL specimens in patients over 66 years of age), is one component of a comprehensive surveillance program.  Test performance has been validated by [facility] for patients greater than or equal to 19 year old. It is not intended to diagnose infection nor to guide or monitor treatment.   Anaerobic culture     Status: None (Preliminary result)   Collection Time: 05/09/14  4:31 PM  Result Value Ref Range Status   Specimen Description ABSCESS LEFT THIGH  Final   Special Requests PATIENT ON FOLLOWING DOXYCYCLINE  Final   Gram Stain   Final    NO WBC SEEN NO SQUAMOUS EPITHELIAL CELLS SEEN NO ORGANISMS SEEN Performed at [facility]    Culture   Final    NO ANAEROBES ISOLATED; CULTURE IN PROGRESS FOR 5 DAYS Performed at [facility]    Report Status PENDING  Incomplete  Gram stain     Status: None   Collection Time: 05/09/14  4:31 PM  Result Value Ref Range Status   Specimen Description ABSCESS LEFT THIGH  Final   Special Requests NONE  Final   Gram Stain   Final    FEW WBC PRESENT, PREDOMINANTLY PMN NO ORGANISMS SEEN Gram Stain Report Called to,Read Back By and Verified With: LITTLE,L. RN @1755  ON 4.1.16 BY MCCOY,N.    Report Status 05/09/2014 FINAL  Final  Culture, routine-abscess      Status: None (Preliminary result)   Collection Time: 05/09/14  4:31 PM  Result Value Ref Range Status   Specimen Description ABSCESS LEFT THIGH  Final   Special Requests PATIENT ON FOLLOWING DOXYCYCLINE  Final   Gram Stain   Final    NO WBC SEEN NO SQUAMOUS EPITHELIAL CELLS SEEN NO ORGANISMS SEEN Performed at [facility]    Culture   Final    RARE STAPHYLOCOCCUS AUREUS Note: RIFAMPIN AND GENTAMICIN SHOULD NOT BE USED AS SINGLE DRUGS FOR TREATMENT OF STAPH INFECTIONS. Performed at [facility]    Report Status PENDING  Incomplete  Tissue culture     Status: None (Preliminary result)   Collection Time: 05/09/14  4:31 PM  Result Value Ref Range Status   Specimen Description ABSCESS LEFT THIGH  Final   Special Requests PATIENT ON FOLLOWING DOXYCUCLINE  Final   Gram Stain   Final    RARE WBC PRESENT, PREDOMINANTLY MONONUCLEAR NO ORGANISMS SEEN Performed at [facility]    Culture   Final    FEW STAPHYLOCOCCUS AUREUS Note: RIFAMPIN AND GENTAMICIN SHOULD NOT BE USED AS SINGLE DRUGS FOR TREATMENT OF STAPH INFECTIONS. Performed at [facility]    Report Status PENDING  Incomplete    Assessment: Her operative cultures are growing staph aureus with sensitivities pending. Unfortunately no operative specimens were sent for AFB stain and culture. She will need continued treatment for staph aureus and Mycobacterium fortuitum. I let her know that my partners and I would not agree to oversee outpatient IV antibiotics because of her history. She tells me she is not sure that she wants to go to a skilled nursing facility. She says that she has been having trouble here with nausea and vomiting although I noted that her lunch plate has been wiped clean.  Plan: 1. Continue current antibiotics pending cultures; continue IV form if she agrees to skilled nursing facility placement. Convert back to oral form if she is discharged home. 2. I would strongly consider  psychiatric evaluation  Michel Bickers, MD [facility] [place] [phone] pager   [phone] cell 05/12/2014, 2:57 PM

## 2014-05-12 NOTE — Progress Notes (Signed)
Occupational Therapy Treatment Patient Details Name: [name] Carr MRN: [id number] DOB: 1955/11/18 Today's Date: 05/12/2014    History of present illness s/p L thigh abscess post I and D.  Pt with history of thigh I and D, lupus, chronic pain and Breast cancer   OT comments  Pt did get to [facility] with min guard A.  Pt with many many complaints.  RN aware  Follow Up Recommendations  SNF    Equipment Recommendations  None recommended by OT       Precautions / Restrictions Precautions Precautions: Fall Restrictions Weight Bearing Restrictions: No       Mobility Bed Mobility Overal bed mobility: Needs Assistance Bed Mobility: Supine to Sit;Sit to Supine     Supine to sit: Min guard Sit to supine: Min guard   General bed mobility comments: close guard for safety  Transfers Overall transfer level: Needs assistance Equipment used: Rolling walker (2 wheeled)   Sit to Stand: Min guard         General transfer comment: close guard for safety VCs hand placement.        ADL Overall ADL's : Needs assistance/impaired                     Lower Body Dressing: Minimal assistance;Sit to/from stand   Toilet Transfer: Min guard;BSC;Cueing for safety;Stand-pivot   Toileting- Water quality scientist and Hygiene: Minimal assistance;Sit to/from stand         General ADL Comments: increased time and encouragement needed. Pt very focused on speaking with MD regarding pain meds and multiple complaints                Cognition   Behavior During Therapy: WFL for tasks assessed/performed Overall Cognitive Status: Within Functional Limits for tasks assessed                               General Comments Pt stated she was thinking about leaving AMA    Pertinent Vitals/ Pain       Pain Score: 9  Pain Location: l thigh Pain Descriptors / Indicators: Sore Pain Intervention(s): Repositioned;Patient requesting pain meds-RN notified;Ice applied  Home Living                                           Prior Functioning/Environment              Frequency Min 2X/week     Progress Toward Goals  OT Goals(current goals can now be found in the care plan section)  Progress towards OT goals: Progressing toward goals     Plan Discharge plan remains appropriate    Co-evaluation                 End of Session     Activity Tolerance Patient limited by pain   Patient Left in bed with call bell with in reach and PT entering room   Nurse Communication  progress of session         Time: 1694-5038 OT Time Calculation (min): 23 min  Charges: OT General Charges $OT Visit: 1 Procedure OT Treatments $Self Care/Home Management : 23-37 mins  [name] Carr D 05/12/2014, 11:18 AM

## 2014-05-12 NOTE — Progress Notes (Signed)
[facility] PROGRESS NOTE  [name] Carr DTO:[id number] DOB: 1955-07-27 DOA: 05/08/2014 PCP: [name], MD  Assessment/Plan: 1. Left thigh abscess -Patient with a history of deep soft tissue abscess of left thigh, admitted back in November 2015 undergoing incision and drainage on 12/22/2013 as cultures grew Mycobacterium fortuitum. -At the time she was discharged on Ceftin orally and 600 mg IV twice a day after PICC line placement. She had been following Dr. Megan Salon of infectious disease at the clinic. -She now presents with recurrent left thigh abscess as demonstrated by CT scan. Orthopedic surgery has been consulted. Plan for excisional debridement in the next 24 hours -Case discussed with active Megan Salon, recommended continuing empiric IV antibiotic therapy with azithromycin and doxycycline, however on discharge will transition to oral antimicrobial therapy -Status post left thigh excisional debridement, procedure performed on 05/10/2014 -Tissue cultures growing Staphylococcus aureus, susceptibility testing pending at the time of this dictation  2.  SLE -Continue oral Prednisone 10 mg PO q daily  3.  Hypokalemia -Labs showing potassium of 4.0 after replacement.   4.  Polypharmacy/drug-seeking behavior -Held extensive discussion with patient regarding multiple psychotropic medications. She reports having a "tolerance" to narcotic analgesics. -I spoke to Dr. Megan Salon of infectious disease, patient having a long history of drug-seeking behavior and PICC line manipulation. In the past she has reported intolerance to oral antimicrobial therapy and has made specific requests for PICC line placement, with providers noticing no issues taking other oral meds and her being lost to follow-up. Multiple phone messages left on Epic by her providers. Dr. Megan Salon did not recommend discharging her on a PICC line and that oral antimicrobial therapy should be prescribed.  -On 05/12/2014 she  requested that I increase her ID dilaudid dose, stating have significant pain. On my evaluation her complaints appeared to outweigh physical exam findings. Additionally RN reporting that she had been completing her meals with out any issues. I explained that her pain was being addressed with the administration of Norco 10/325 and Fentanyl patch, along with gabapentin 800 mg PO TID and zanaflex. I explained that IV narcotic medication should be tapered off, especially with her tolerating PO. She became visibly upset with me. She told me that I instructed RN to not page me regarding her care. I explained to her that I had discussion with RN regarding not escalating IV narcotic analgesics, and contraindicated in oversedated patients. She  Also requested IV phenergan stating zofran did not work.  Dr Megan Salon recommended psychiatry consultation. This has been placed.    Code Status: Full code Family Communication: Family not present Disposition Plan:    Consultants:  Infectious disease  Orthopedic surgery   Antibiotics:  Doxycycline  Azithromycin  HPI/Subjective: Patient is a pleasant 59 year old female with a past medical history of left thigh infection status post incision and drainage in November 2015 was admitted to medicine service on 05/08/2014 presenting with complaints of left thigh pain and swelling. He had a CT scan of her left thigh ordered by her primary care provider that showed recurrent deep soft tissue abscess in the vastus lateralis and rectus for [place]. Abscess demarcated by fluid and gas within the anterior lateral musculature of the left thigh. She was instructed by her primary care provider to present to the emergency department. Patient was seen and evaluated by orthopedic surgery as she will likely be taken to the operating room for extensive excisional debridement within the next 24 hours. Patient remains nothing by mouth, was started on empiric IV antibiotic  therapy with  doxycycline and a azithromycin. Patient is well-known to Dr. Megan Salon of infectious disease. I spoke with Dr. Megan Salon regarding susceptibility testing of organism isolated from cultures back in November 2015.  Objective: Filed Vitals:   05/12/14 1630  BP: 155/64  Pulse: 75  Temp: 98.2 F (36.8 C)  Resp: 20    Intake/Output Summary (Last 24 hours) at 05/12/14 1643 Last data filed at 05/12/14 1400  Gross per 24 hour  Intake    560 ml  Output      0 ml  Net    560 ml   Filed Weights   05/08/14 2302  Weight: 77.157 kg (170 lb 1.6 oz)    Exam:   General:  Patient is in no acute distress, she is awake, alert, mentating well  Cardiovascular: Regular rate and rhythm normal S1-S2 no murmurs rubs or gallops  Respiratory: Normal respiratory effort, lungs are clear to auscultation  Abdomen: Soft nontender nondistended positive bowel sounds  Musculoskeletal: Patient having swelling over left thigh region, status post incision and drainage of left thigh abscess, incision site appears clean, no evidence of hematoma, active bleeding or infection  Data Reviewed: Basic Metabolic Panel:  Recent Labs Lab 05/07/14 1459 05/08/14 1958 05/09/14 0600 05/10/14 0440 05/11/14 0500  NA 136 137 138 140 140  K 3.5 3.1* 3.1* 3.2* 4.0  CL 93* 96 99 96 101  CO2 29 30 28 28 31   GLUCOSE 130* 105* 147* 105* 159*  BUN 12 14 12 11 17   CREATININE 0.90 0.88 0.93 0.97 0.80  CALCIUM 10.2 9.1 9.4 8.8 9.0   Liver Function Tests:  Recent Labs Lab 05/07/14 1459 05/08/14 1958 05/09/14 0600  AST 16 19 21   ALT 15 18 18   ALKPHOS 113 97 101  BILITOT 0.7 0.3 0.4  PROT 8.3 7.7 8.1  ALBUMIN 4.7 4.1 4.2   No results for input(s): LIPASE, AMYLASE in the last 168 hours. No results for input(s): AMMONIA in the last 168 hours. CBC:  Recent Labs Lab 05/07/14 1457 05/08/14 1958 05/09/14 0600 05/10/14 0440 05/11/14 0500  WBC 11.8* 11.4* 12.4* 9.7 8.3  NEUTROABS 10.5* 9.1* 11.0*  --   --   HGB  13.9 11.8* 12.6 10.4* 8.9*  HCT 42.7 36.2 40.6 32.6* 28.7*  MCV 91 91.9 93.5 93.4 94.1  PLT 260 248 271 233 208   Cardiac Enzymes: No results for input(s): CKTOTAL, CKMB, CKMBINDEX, TROPONINI in the last 168 hours. BNP (last 3 results) No results for input(s): BNP in the last 8760 hours.  ProBNP (last 3 results) No results for input(s): PROBNP in the last 8760 hours.  CBG:  Recent Labs Lab 05/11/14 1607 05/11/14 2153 05/12/14 0753 05/12/14 1205 05/12/14 1627  GLUCAP 129* 163* 120* 170* 115*    Recent Results (from the past 240 hour(s))  Surgical pcr screen     Status: Abnormal   Collection Time: 05/08/14 11:29 PM  Result Value Ref Range Status   MRSA, PCR POSITIVE (A) NEGATIVE Final    Comment: RESULT CALLED TO, READ BACK BY AND VERIFIED WITH: MERRITT,A/5E @0224  ON 05/09/14 BY KARCZEWSKI,S.    Staphylococcus aureus POSITIVE (A) NEGATIVE Final    Comment:        The Xpert SA Assay (FDA approved for NASAL specimens in patients over 38 years of age), is one component of a comprehensive surveillance program.  Test performance has been validated by [facility] for patients greater than or equal to 80 year old. It is not intended  to diagnose infection nor to guide or monitor treatment.   Anaerobic culture     Status: None (Preliminary result)   Collection Time: 05/09/14  4:31 PM  Result Value Ref Range Status   Specimen Description ABSCESS LEFT THIGH  Final   Special Requests PATIENT ON FOLLOWING DOXYCYCLINE  Final   Gram Stain   Final    NO WBC SEEN NO SQUAMOUS EPITHELIAL CELLS SEEN NO ORGANISMS SEEN Performed at [facility]    Culture   Final    NO ANAEROBES ISOLATED; CULTURE IN PROGRESS FOR 5 DAYS Performed at [facility]    Report Status PENDING  Incomplete  Gram stain     Status: None   Collection Time: 05/09/14  4:31 PM  Result Value Ref Range Status   Specimen Description ABSCESS LEFT THIGH  Final   Special Requests NONE  Final    Gram Stain   Final    FEW WBC PRESENT, PREDOMINANTLY PMN NO ORGANISMS SEEN Gram Stain Report Called to,Read Back By and Verified With: LITTLE,L. RN @1755  ON 4.1.16 BY MCCOY,N.    Report Status 05/09/2014 FINAL  Final  Culture, routine-abscess     Status: None (Preliminary result)   Collection Time: 05/09/14  4:31 PM  Result Value Ref Range Status   Specimen Description ABSCESS LEFT THIGH  Final   Special Requests PATIENT ON FOLLOWING DOXYCYCLINE  Final   Gram Stain   Final    NO WBC SEEN NO SQUAMOUS EPITHELIAL CELLS SEEN NO ORGANISMS SEEN Performed at [facility]    Culture   Final    RARE STAPHYLOCOCCUS AUREUS Note: RIFAMPIN AND GENTAMICIN SHOULD NOT BE USED AS SINGLE DRUGS FOR TREATMENT OF STAPH INFECTIONS. Performed at [facility]    Report Status PENDING  Incomplete  Tissue culture     Status: None (Preliminary result)   Collection Time: 05/09/14  4:31 PM  Result Value Ref Range Status   Specimen Description ABSCESS LEFT THIGH  Final   Special Requests PATIENT ON FOLLOWING DOXYCUCLINE  Final   Gram Stain   Final    RARE WBC PRESENT, PREDOMINANTLY MONONUCLEAR NO ORGANISMS SEEN Performed at [facility]    Culture   Final    FEW STAPHYLOCOCCUS AUREUS Note: RIFAMPIN AND GENTAMICIN SHOULD NOT BE USED AS SINGLE DRUGS FOR TREATMENT OF STAPH INFECTIONS. Performed at [facility]    Report Status PENDING  Incomplete     Studies: No results found.  Scheduled Meds: . azithromycin  500 mg Intravenous QHS  . Chlorhexidine Gluconate Cloth  6 each Topical Q0600  . clonazePAM  1 mg Oral BID  . doxycycline (VIBRAMYCIN) IV  100 mg Intravenous BID  . DULoxetine  60 mg Oral BID  . enoxaparin (LOVENOX) injection  40 mg Subcutaneous Q24H  . fentaNYL  25 mcg Transdermal Q72H  . gabapentin  800 mg Oral TID  . insulin aspart  0-9 Units Subcutaneous TID WC  . mupirocin ointment  1 application Nasal BID  . pantoprazole  40 mg Oral Daily  .  predniSONE  10 mg Oral Q breakfast  . rOPINIRole  1 mg Oral QHS  . traZODone  150-300 mg Oral QHS   Continuous Infusions:    Principal Problem:   Abscess of left thigh Active Problems:   Chronic pain syndrome   SLE-on prednisone-Follow at [facility]   Mycobacterium fortuitum infection   Diabetes mellitus type 2, controlled   Infection   Drug-seeking behavior  Time spent: 40 min    Kelvin Cellar  [facility] Pager [phone]. If 7PM-7AM, please contact night-coverage at [email or web address], password [facility] 05/12/2014, 4:43 PM  LOS: 4 days

## 2014-05-12 NOTE — Progress Notes (Addendum)
Physical Therapy Treatment Patient Details Name: [name] Carr MRN: [id number] DOB: 17-Jun-1955 Today's Date: 05/12/2014    History of Present Illness s/p L thigh abscess post I and D.  Pt with history of thigh I and D, lupus, chronic pain and Breast cancer    PT Comments    Pt's treatment was limited today by 9/10 pain, but provided education on importance of moving with therapy.  Also educated pt and friend on role of PT in hospital of getting out of bed and working in room rather than the gym.  Pt and pt's friend have different stories of how well she was moving around prior to admission.  Pt saying many contradictory statements during treatment.    Follow Up Recommendations  SNF     Equipment Recommendations  None recommended by PT    Recommendations for Other Services       Precautions / Restrictions Precautions Precautions: Fall Restrictions Weight Bearing Restrictions: No    Mobility  Bed Mobility           Transfers                Ambulation/Gait                 Stairs            Wheelchair Mobility    Modified Rankin (Stroke Patients Only)       Balance                                    Cognition Arousal/Alertness: Awake/alert Behavior During Therapy: Restless;WFL for tasks assessed/performed;Agitated Overall Cognitive Status: Within Functional Limits for tasks assessed                      Exercises General Exercises - Lower Extremity Ankle Circles/Pumps: AROM;5 reps;Both    General Comments General comments (skin integrity, edema, etc.): Pt refused OOB as she had just used BSC with OT.  Focused treatment on education regarding purpose of therapy in the hospital.  Friend called and spoke to PT who states pt was not walking much at all prior to admission.  Pt denies this stating she walked her dogs.  (Pt had given permission for PT to speak to friend).  Educated friend of purpose of PT as well.   Attempted heel slides, quad sets, hip abd, but pt unable to perform.  Pt talking almost incessantly and hard to get pt to focus on what PT was trying to get pt to do.  Upon exit, one of the nursing leaders was coming into room to speak with patient.      Pertinent Vitals/Pain Pain Assessment: 0-10 Pain Score: 9  Pain Location: L thigh Pain Descriptors / Indicators: Sore Pain Intervention(s): Ice applied;Premedicated before session;Limited activity within patient's tolerance    Home Living                      Prior Function            PT Goals (current goals can now be found in the care plan section) Progress towards PT goals: Not progressing toward goals - comment (Pt refused to get OOB)    Frequency  Min 3X/week    PT Plan Current plan remains appropriate    Co-evaluation             End of Session  Activity Tolerance: Patient limited by pain Patient left: in bed;with call bell/phone within reach     Time:  1040- 1057    Charges:   therapeutic activity (8-22 minutes)                    G Codes:      [name] Carr 05/12/2014, 12:46 PM

## 2014-05-13 DIAGNOSIS — B9562 Methicillin resistant Staphylococcus aureus infection as the cause of diseases classified elsewhere: Secondary | ICD-10-CM

## 2014-05-13 LAB — BASIC METABOLIC PANEL
Anion gap: 8 (ref 5–15)
BUN: 20 mg/dL (ref 6–23)
CO2: 37 mmol/L — ABNORMAL HIGH (ref 19–32)
Calcium: 8.7 mg/dL (ref 8.4–10.5)
Chloride: 94 mmol/L — ABNORMAL LOW (ref 96–112)
Creatinine, Ser: 0.89 mg/dL (ref 0.50–1.10)
GFR calc Af Amer: 81 mL/min — ABNORMAL LOW (ref >90)
GFR, EST NON AFRICAN AMERICAN: 70 mL/min — AB (ref >90)
Glucose, Bld: 105 mg/dL — ABNORMAL HIGH (ref 70–99)
POTASSIUM: 3.8 mmol/L (ref 3.5–5.1)
Sodium: 139 mmol/L (ref 135–145)

## 2014-05-13 LAB — CBC
HCT: 29.9 % — ABNORMAL LOW (ref 36.0–46.0)
Hemoglobin: 9.3 g/dL — ABNORMAL LOW (ref 12.0–15.0)
MCH: 29.3 pg (ref 26.0–34.0)
MCHC: 31.1 g/dL (ref 30.0–36.0)
MCV: 94.3 fL (ref 78.0–100.0)
PLATELETS: 238 10*3/uL (ref 150–400)
RBC: 3.17 MIL/uL — AB (ref 3.87–5.11)
RDW: 14.7 % (ref 11.5–15.5)
WBC: 8.2 10*3/uL (ref 4.0–10.5)

## 2014-05-13 LAB — TISSUE CULTURE

## 2014-05-13 LAB — GLUCOSE, CAPILLARY
Glucose-Capillary: 101 mg/dL — ABNORMAL HIGH (ref 70–99)
Glucose-Capillary: 117 mg/dL — ABNORMAL HIGH (ref 70–99)
Glucose-Capillary: 149 mg/dL — ABNORMAL HIGH (ref 70–99)
Glucose-Capillary: 105 mg/dL — ABNORMAL HIGH (ref 70–99)

## 2014-05-13 MED ORDER — DOXYCYCLINE HYCLATE 100 MG PO TABS
100.0000 mg | ORAL_TABLET | Freq: Two times a day (BID) | ORAL | Status: DC
  Administered 2014-05-13 – 2014-05-14 (×3): 100 mg via ORAL
  Filled 2014-05-13 (×4): qty 1

## 2014-05-13 MED ORDER — AZITHROMYCIN 500 MG PO TABS
500.0000 mg | ORAL_TABLET | Freq: Every day | ORAL | Status: DC
  Administered 2014-05-13: 500 mg via ORAL
  Filled 2014-05-13 (×2): qty 1

## 2014-05-13 NOTE — Consult Note (Signed)
[facility] Consult   Reason for Consult:  Depression, anxiety and drug seeking behaviors Referring Physician:  Dr. Coralyn Pear Patient Identification: [name] Carr MRN:  [id number] Principal Diagnosis: Major depression, recurrent, chronic Diagnosis:   Patient Active Problem List   Diagnosis Date Noted  . Drug-seeking behavior [F19.10]   . Infection [B99.9] 05/09/2014  . Abscess of left thigh [L02.416] 05/08/2014  . Diabetes mellitus type 2, controlled [E11.9] 05/08/2014  . 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 Neuropathy [G62.9] 08/28/2013  . Hypertension [I10]   . Cellulitis and abscess of leg [L02.419, L03.119] 08/22/2013  . Syncope [R55] 01/11/2012  . SLE-on prednisone-Follow at [facility] [M32.9] 02/05/2011  . DVT L IJ 12/26/10 [I82.C19] 02/04/2011  . Normocytic anemia [D64.9] 12/27/2010  . Malignant neoplasm of female breast [C50.919] 09/07/2009  . Type 2 diabetes mellitus [E11.9] 09/07/2009  . HYPERLIPIDEMIA [E78.5] 09/07/2009  . ANEMIA-IRON DEFICIENCY [D50.9] 09/07/2009  . Chronic pain syndrome [G89.4] 09/07/2009  . MIGRAINE HEADACHE [G43.909] 09/07/2009  . ALLERGIC RHINITIS [J30.9] 09/07/2009  . GERD [K21.9] 09/07/2009  . SEIZURE DISORDER [R56.9] 09/07/2009  . PERSONALITY DISORDER [F60.9] 09/01/2009  . Major depression, recurrent, chronic [F33.9] 09/01/2009  . RENAL INSUFFICIENCY [N25.9] 09/01/2009    Total Time spent with patient: 1 hour  Subjective:   [name] Carr is a 59 y.o. female patient admitted with Depression, and drug seeking behaviors  HPI:  [name] Carr is a 59 y.o. female admitted to [facility] for worsening of patient's abscess with extension into the deeper planes. Psychiatric consultation requested for symptoms of depression, anxiety, IV drug seeking behavior for pain. Patient reported she has been taking her pain medication over 5 years and denies misusing or abusing. Reportedly patient has been using her  port or PICC line for injecting pain medication. Patient does not like to be accused and repeatedly denied abusing her pain medication. Patient reportedly spoke with her pain doctor regarding CDC recommendation of no pain medication for long term use, patient stated that his sad and none of them can get me out of the bed. Patient reported she has been suffering with the SLE which affected her brain, hot and multiple joints. Patient is also known to have personality disorder/borderline personality. 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 Patient is currently disabled and receives a disability check monthly and has few friends help her. Reportedly patient has a home health service offering about 80 hours week and she stated she does not need that much she wanted to cut it down. Patient reportedly can work in her home but not outside the home. Patient denies current suicidal, homicidal ideation, intention or plans. Patient has no evidence of psychosis. Patient contract for safety. Reportedly patient has a brother in [place], [place] and her parents passed away about 20 years ago. Patient has a history of acute psychiatric hospitalization [facility] about 20 years ago does not recall more details. Patient has no problems eating, drinking and swallowing her medication but reportedly she refuses and wanted IV antibiotics only which seems like a personality instead of preference.  HPI Elements:   Location:  Depression, anxiety. Quality:  Poor secondary to decreased functioning. Severity:  Moderate. Timing:  Repeated infection and abscess. Duration:  Few weeks. Context:  Psychosocial stresses, health problems and limited support system.  Past Medical History:  Past Medical History  Diagnosis Date  . PERSONALITY DISORDER   . Chronic pain syndrome  chronic narcotics, dependancy hx - [place] for pain mgmt  . MIGRAINE HEADACHE   . Lyme disease   .  ALLERGIC RHINITIS   . ANEMIA-IRON DEFICIENCY   . HYPERLIPIDEMIA   . GERD   . DIABETES MELLITUS, TYPE II   . DEPRESSION   . SLE (systemic lupus erythematosus)     [facility] - chronic pred  . Psoriasis   . Addison's disease   . Degenerative joint disease   . DDD (degenerative disc disease)   . Phlebitis after infusion     left ?calf; "S/P phenergan/demerol injection"  . Blood clot in vein 2011; 02/04/11    "right jugular vein;left jugular vein"  . SEIZURE DISORDER     "lots of seizures; due to lupus"  . Anxiety   . Neuropathy     hands, feet, due to diabetes & back problem  . Surgical wound infection 02/15/2011    R shoulder and mastectomy site - related to PICC  . CARCINOMA, BREAST 08/2009 dx    R breast,  mets to liver - resolution s/p chemo, s/p B mastect  . Metastases to the liver   . RA (rheumatoid arthritis)   . Hypothyroidism pt states she has never had hy pothyroidism  . Hypertension     Past Surgical History  Procedure Laterality Date  . Liver bopsy  09/2009  . Port a cath placement  09/2009; 11/19/10  . Port-a-cath removal  04/2010    "due to staph & strept"  . Port-a-cath removal  12/23/2010    Procedure: REMOVAL PORT-A-CATH;  Surgeon: Pedro Earls, MD;  Location: [facility];  Service: General;  Laterality: Left;  . Breast surgery  09/2009    right breast biopsy  . Mastectomy  11/19/10    bilateral mastectomy   . I&d extremity  ~ 2010    right hand; S/P "dog bite"  . Peripherally inserted central catheter insertion    . Spider bite    . Incision and drainage abscess Left 12/22/2013    Procedure: INCISION AND DRAINAGE ABSCESS OF LEFT THIGH;  Surgeon: Donnie Mesa, MD;  Location: [facility];  Service: General;  Laterality: Left;  . I&d extremity Left 05/09/2014    Procedure: IRRIGATION AND DEBRIDEMENT LEFT THIGH ABSCESS;  Surgeon: Meredith Pel, MD;  Location: [facility];  Service: Orthopedics;  Laterality: Left;   Family History:  Family History  Problem  Relation Age of Onset  . Lung cancer Mother   . Lung cancer Father   . Arthritis Other   . Diabetes Other   . Hyperlipidemia Other    Social History:  History  Alcohol Use No     History  Drug Use No    History   Social History  . Marital Status: Single    Spouse Name: N/A  . Number of Children: N/A  . Years of Education: N/A   Social History Main Topics  . Smoking status: Never Smoker   . Smokeless tobacco: Never Used     Comment: Never used tobacco  . Alcohol Use: No  . Drug Use: No  . Sexual Activity: No   Other Topics Concern  . None   Social History Narrative   Additional Social History:                          Allergies:   Allergies  Allergen Reactions  . Ammonia Other (See Comments)    Ended up on  ventilator from ammonia tabs  . Contrast Media [Iodinated Diagnostic Agents] Other (See Comments)    Doesn't breath well.  . Iohexol Other (See Comments)    SOB   . Methotrexate Anaphylaxis  . Midazolam Hcl Anaphylaxis  . Nalbuphine Other (See Comments)    Can't breathe well  . Infliximab Nausea And Vomiting  . Ketorolac Tromethamine Other (See Comments)    Injectable doesn't work and pill hurts stomach.  . Morphine And Related Hives  . Nsaids Nausea And Vomiting  . Celecoxib Rash    Labs:  Results for orders placed or performed during the hospital encounter of 05/08/14 (from the past 48 hour(s))  Glucose, capillary     Status: Abnormal   Collection Time: 05/11/14  4:07 PM  Result Value Ref Range   Glucose-Capillary 129 (H) 70 - 99 mg/dL   Comment 1 Notify RN   Glucose, capillary     Status: Abnormal   Collection Time: 05/11/14  9:53 PM  Result Value Ref Range   Glucose-Capillary 163 (H) 70 - 99 mg/dL   Comment 1 Notify RN   Glucose, capillary     Status: Abnormal   Collection Time: 05/12/14  7:53 AM  Result Value Ref Range   Glucose-Capillary 120 (H) 70 - 99 mg/dL  Glucose, capillary     Status: Abnormal   Collection Time:  05/12/14 12:05 PM  Result Value Ref Range   Glucose-Capillary 170 (H) 70 - 99 mg/dL  Glucose, capillary     Status: Abnormal   Collection Time: 05/12/14  4:27 PM  Result Value Ref Range   Glucose-Capillary 115 (H) 70 - 99 mg/dL  Glucose, capillary     Status: Abnormal   Collection Time: 05/12/14  9:00 PM  Result Value Ref Range   Glucose-Capillary 129 (H) 70 - 99 mg/dL  CBC     Status: Abnormal   Collection Time: 05/13/14  4:50 AM  Result Value Ref Range   WBC 8.2 4.0 - 10.5 K/uL   RBC 3.17 (L) 3.87 - 5.11 MIL/uL   Hemoglobin 9.3 (L) 12.0 - 15.0 g/dL   HCT 29.9 (L) 36.0 - 46.0 %   MCV 94.3 78.0 - 100.0 fL   MCH 29.3 26.0 - 34.0 pg   MCHC 31.1 30.0 - 36.0 g/dL   RDW 14.7 11.5 - 15.5 %   Platelets 238 150 - 400 K/uL  Basic metabolic panel     Status: Abnormal   Collection Time: 05/13/14  4:50 AM  Result Value Ref Range   Sodium 139 135 - 145 mmol/L   Potassium 3.8 3.5 - 5.1 mmol/L   Chloride 94 (L) 96 - 112 mmol/L   CO2 37 (H) 19 - 32 mmol/L   Glucose, Bld 105 (H) 70 - 99 mg/dL   BUN 20 6 - 23 mg/dL   Creatinine, Ser 0.89 0.50 - 1.10 mg/dL   Calcium 8.7 8.4 - 10.5 mg/dL   GFR calc non Af Amer 70 (L) >90 mL/min   GFR calc Af Amer 81 (L) >90 mL/min    Comment: (NOTE) The eGFR has been calculated using the CKD EPI equation. This calculation has not been validated in all clinical situations. eGFR's persistently <90 mL/min signify possible Chronic Kidney Disease.    Anion gap 8 5 - 15  Glucose, capillary     Status: Abnormal   Collection Time: 05/13/14  7:52 AM  Result Value Ref Range   Glucose-Capillary 101 (H) 70 - 99 mg/dL  Glucose, capillary  Status: Abnormal   Collection Time: 05/13/14 12:15 PM  Result Value Ref Range   Glucose-Capillary 117 (H) 70 - 99 mg/dL    Vitals: Blood pressure 128/53, pulse 70, temperature 98.4 F (36.9 C), temperature source Oral, resp. rate 20, height 5' 5"  (1.651 m), weight 77.157 kg (170 lb 1.6 oz), SpO2 100 %.  Risk to Self: Is  patient at risk for suicide?: No Risk to Others:   Prior Inpatient Therapy:   Prior Outpatient Therapy:    Current Facility-Administered Medications  Medication Dose Route Frequency Provider Last Rate Last Dose  . acetaminophen (TYLENOL) tablet 650 mg  650 mg Oral Q6H PRN Meredith Pel, MD       Or  . acetaminophen (TYLENOL) suppository 650 mg  650 mg Rectal Q6H PRN Meredith Pel, MD      . azithromycin [facility]) tablet 500 mg  500 mg Oral QHS Kelvin Cellar, MD      . Chlorhexidine Gluconate Cloth 2 % PADS 6 each  6 each Topical Q0600 Rise Patience, MD   6 each at 05/11/14 [phone]  . clonazePAM (KLONOPIN) tablet 1 mg  1 mg Oral BID Rise Patience, MD   1 mg at 05/13/14 1100  . doxycycline (VIBRA-TABS) tablet 100 mg  100 mg Oral Q12H Kelvin Cellar, MD   100 mg at 05/13/14 1236  . DULoxetine (CYMBALTA) DR capsule 60 mg  60 mg Oral BID Kelvin Cellar, MD   60 mg at 05/13/14 1055  . enoxaparin (LOVENOX) injection 40 mg  40 mg Subcutaneous Q24H Meredith Pel, MD   40 mg at 05/13/14 0805  . fentaNYL (DURAGESIC - dosed mcg/hr) patch 25 mcg  25 mcg Transdermal Q72H Rise Patience, MD   25 mcg at 05/11/14 2241  . fentaNYL (SUBLIMAZE) injection 25-50 mcg  25-50 mcg Intravenous Q5 min PRN Montez Hageman, MD   25 mcg at 05/09/14 1919  . gabapentin (NEURONTIN) capsule 800 mg  800 mg Oral TID Kelvin Cellar, MD   800 mg at 05/13/14 1055  . HYDROcodone-acetaminophen (NORCO) 10-325 MG per tablet 1 tablet  1 tablet Oral Q6H PRN Kelvin Cellar, MD   1 tablet at 05/13/14 0805  . HYDROmorphone (DILAUDID) injection 1 mg  1 mg Intravenous Q2H PRN Kelvin Cellar, MD   1 mg at 05/13/14 0901  . insulin aspart (novoLOG) injection 0-9 Units  0-9 Units Subcutaneous TID WC Ritta Slot, NP   2 Units at 05/12/14 1253  . meperidine (DEMEROL) injection 6.25-12.5 mg  6.25-12.5 mg Intravenous Q5 min PRN Montez Hageman, MD      . metoCLOPramide (REGLAN) tablet 5-10 mg  5-10 mg Oral Q8H PRN  Meredith Pel, MD       Or  . metoCLOPramide (REGLAN) injection 5-10 mg  5-10 mg Intravenous Q8H PRN Meredith Pel, MD   10 mg at 05/12/14 2226  . mupirocin ointment (BACTROBAN) 2 % 1 application  1 application Nasal BID Rise Patience, MD   1 application at 86/38/17 1056  . ondansetron (ZOFRAN) tablet 4 mg  4 mg Oral Q6H PRN Rise Patience, MD       Or  . ondansetron [facility]) injection 4 mg  4 mg Intravenous Q6H PRN Rise Patience, MD   4 mg at 05/13/14 7116  . ondansetron (ZOFRAN) tablet 4 mg  4 mg Oral Q6H PRN Meredith Pel, MD   4 mg at 05/12/14 1140   Or  . ondansetron [facility])  injection 4 mg  4 mg Intravenous Q6H PRN Meredith Pel, MD      . pantoprazole (PROTONIX) EC tablet 40 mg  40 mg Oral Daily Kelvin Cellar, MD   40 mg at 05/13/14 1055  . predniSONE (DELTASONE) tablet 10 mg  10 mg Oral Q breakfast Kelvin Cellar, MD   10 mg at 05/13/14 0805  . rOPINIRole (REQUIP) tablet 1 mg  1 mg Oral QHS Rise Patience, MD   1 mg at 05/12/14 2114  . sodium chloride 0.9 % injection 10-40 mL  10-40 mL Intracatheter PRN Kelvin Cellar, MD   10 mL at 05/11/14 0556  . tiZANidine (ZANAFLEX) tablet 4 mg  4 mg Oral Q6H PRN Kelvin Cellar, MD   4 mg at 05/13/14 1055  . traZODone (DESYREL) tablet 150-300 mg  150-300 mg Oral QHS Rise Patience, MD   300 mg at 05/12/14 2114    Musculoskeletal: Strength & Muscle Tone: decreased Gait & Station: unable to stand Patient leans: N/A  Psychiatric Specialty Exam: Physical Exam as per history and physical   ROS depression, anxiety and worried about losing her IV antibiotics.   Blood pressure 128/53, pulse 70, temperature 98.4 F (36.9 C), temperature source Oral, resp. rate 20, height 5' 5"  (1.651 m), weight 77.157 kg (170 lb 1.6 oz), SpO2 100 %.Body mass index is 28.31 kg/(m^2).  General Appearance: Casual  Eye Contact::  Good  Speech:  Clear and Coherent  Volume:  Normal  Mood:  Anxious and Depressed   Affect:  Congruent and Constricted  Thought Process:  Coherent and Goal Directed  Orientation:  Full (Time, Place, and Person)  Thought Content:  WDL  Suicidal Thoughts:  No  Homicidal Thoughts:  No  Memory:  Immediate;   Good Recent;   Good  Judgement:  Fair  Insight:  Good  Psychomotor Activity:  Decreased  Concentration:  Fair  Recall:  Good  Fund of Knowledge:Good  Language: Good  Akathisia:  Negative  Handed:  Right  AIMS (if indicated):     Assets:  Communication Skills Desire for Improvement Financial Resources/Insurance Housing Leisure Time Resilience Social Support Talents/Skills  ADL's:  Impaired  Cognition: WNL  Sleep:      Medical Decision Making: Review of Psycho-Social Stressors (1), Review or order clinical lab tests (1), Established Problem, Worsening (2), Review of Last Therapy Session (1), Review or order medicine tests (1), Review of Medication Regimen & Side Effects (2) and Review of New Medication or Change in Dosage (2)  Treatment Plan Summary: Daily contact with patient to assess and evaluate symptoms and progress in treatment and Medication management  Plan:  Continue her current medication management Continue gabapentin 800 mg 3 times a day, trazodone 300 mg at bedtime, Cymbalta 60 mg twice daily and Klonopin 1 mg twice daily Patient does not meet criteria for psychiatric inpatient admission. Supportive therapy provided about ongoing stressors. Appreciate psychiatric consultation and follow up as clinically required Please contact [phone] or [phone] if needs further assistance  Disposition: patient will be referred to the outpatient psychiatric services and counseling when medically discharged   Nianna Igo,JANARDHAHA R. 05/13/2014 1:51 PM

## 2014-05-13 NOTE — Progress Notes (Addendum)
[facility] PROGRESS NOTE  [name] Carr GYB:[id number] DOB: 19-May-1955 DOA: 05/08/2014 PCP: [name], MD  Interim Summary Patient is a pleasant 59 year old female with a past medical history of left thigh infection status post incision and drainage in November 2015 was admitted to medicine service on 05/08/2014 presenting with complaints of left thigh pain and swelling. He had a CT scan of her left thigh ordered by her primary care provider that showed recurrent deep soft tissue abscess in the vastus lateralis and rectus for [place]. Abscess demarcated by fluid and gas within the anterior lateral musculature of the left thigh. She was instructed by her primary care provider to present to the emergency department. Patient was seen and evaluated by orthopedic surgery as she was taken to the operating room for extensive excisional debridement on 05/10/2014, procedure performed by Dr Marlou Sa of orthopedic surgery. Dr. Megan Salon of infectious disease was consulted as he recommended continuing IV doxycycline and azithromycin. Throughout this hospitalization patient voicing significant concerns over her pain regimen, asking multiple questions related to IV dilaudid and becoming visibly upset with decreasing IV Dilaudid dose despite being Norco and fentanyl patch. Dr Megan Salon expressed concerns over her being discharged on a PICC line, feeling that this was not safe given concerns from multiple providers for PICC line manipulating. She requested being discharged on a PICC line on the grounds that she could not tolerate oral antimicrobial therapy however could tolerate other oral medications and food/drink. On 05/13/2014 after extensive discussion with Dr Megan Salon she agreed to give oral antibiotic therapy a try. Plan to d/c PICC line in am if she tolerates oral antimicrobial theray and discharge home with home health services.    Assessment/Plan: 1. Left thigh abscess -Patient with a history of deep soft  tissue abscess of left thigh, admitted back in November 2015 undergoing incision and drainage on 12/22/2013 as cultures grew Mycobacterium fortuitum. -At the time she was discharged on Ceftin orally and 600 mg IV twice a day after PICC line placement. She had been following Dr. Megan Salon of infectious disease at the clinic. -She now presents with recurrent left thigh abscess as demonstrated by CT scan. Orthopedic surgery has been consulted. Plan for excisional debridement in the next 24 hours -Case discussed with active Megan Salon, recommended continuing empiric IV antibiotic therapy with azithromycin and doxycycline, however on discharge will transition to oral antimicrobial therapy -Status post left thigh excisional debridement, procedure performed on 05/10/2014 -Tissue cultures growing MRSA, susceptible to Doxy -Case discussed with Dr Megan Salon, she was transitioned to PO doxy and azithromycin, if she tolerates can be sent home on this regimen and d/c PICC line prior to discharge.   2.  SLE -Continue oral Prednisone 10 mg PO q daily  3.  Hypokalemia -Labs showing potassium of 3.8 after replacement.   4.  Polypharmacy/drug-seeking behavior -Held extensive discussion with patient regarding multiple psychotropic medications. She reports having a "tolerance" to narcotic analgesics. -I spoke to Dr. Megan Salon of infectious disease, patient having a long history of drug-seeking behavior and PICC line manipulation. In the past she has reported intolerance to oral antimicrobial therapy and has made specific requests for PICC line placement, with providers noticing no issues taking other oral meds and her being lost to follow-up. Multiple phone messages left on Epic by her providers. Dr. Megan Salon did not recommend discharging her on a PICC line and that oral antimicrobial therapy should be prescribed.  -On 05/12/2014 she requested that I increase her ID dilaudid dose, stating have significant pain. On  my  evaluation her complaints appeared to outweigh physical exam findings. Additionally RN reporting that she had been completing her meals with out any issues. I explained that her pain was being addressed with the administration of Norco 10/325 and Fentanyl patch, along with gabapentin 800 mg PO TID and zanaflex. I explained that IV narcotic medication should be tapered off, especially with her tolerating PO. She became visibly upset with me. She told me that I instructed RN to not page me regarding her care. I explained to her that I had discussion with RN regarding not escalating IV narcotic analgesics, and contraindicated in oversedated patients. She  Also requested IV phenergan stating zofran did not work.   -On 05/13/2014 patient seen by psychiatry. She was amenable to give an oral antibiotic therapy a try. IV antimicrobial therapy was discontinued, transitoned to PO.    Code Status: Full code Family Communication: Family not present Disposition Plan:  Will see how she does with oral antibiotic therapy overnight, if she tolerates will need to discontinue PICC line prior to discharge.    Consultants:  Infectious disease  Orthopedic surgery  Psychiatry   Antibiotics:  Doxycycline  Azithromycin  HPI/Subjective: Patient appears comfortable, sitting up in bed reading the paper, in no acute distress.   Objective: Filed Vitals:   05/13/14 0627  BP: 128/53  Pulse: 70  Temp: 98.4 F (36.9 C)  Resp: 20    Intake/Output Summary (Last 24 hours) at 05/13/14 1523 Last data filed at 05/13/14 1318  Gross per 24 hour  Intake   1980 ml  Output      0 ml  Net   1980 ml   Filed Weights   05/08/14 2302  Weight: 77.157 kg (170 lb 1.6 oz)    Exam:   General:  Patient is in no acute distress, she is awake, alert, mentating well  Cardiovascular: Regular rate and rhythm normal S1-S2 no murmurs rubs or gallops  Respiratory: Normal respiratory effort, lungs are clear to  auscultation  Abdomen: Soft nontender nondistended positive bowel sounds  Musculoskeletal: Patient having swelling over left thigh region, status post incision and drainage of left thigh abscess, incision site appears clean, no evidence of hematoma, active bleeding or infection  Data Reviewed: Basic Metabolic Panel:  Recent Labs Lab 05/08/14 1958 05/09/14 0600 05/10/14 0440 05/11/14 0500 05/13/14 0450  NA 137 138 140 140 139  K 3.1* 3.1* 3.2* 4.0 3.8  CL 96 99 96 101 94*  CO2 30 28 28 31  37*  GLUCOSE 105* 147* 105* 159* 105*  BUN 14 12 11 17 20   CREATININE 0.88 0.93 0.97 0.80 0.89  CALCIUM 9.1 9.4 8.8 9.0 8.7   Liver Function Tests:  Recent Labs Lab 05/07/14 1459 05/08/14 1958 05/09/14 0600  AST 16 19 21   ALT 15 18 18   ALKPHOS 113 97 101  BILITOT 0.7 0.3 0.4  PROT 8.3 7.7 8.1  ALBUMIN 4.7 4.1 4.2   No results for input(s): LIPASE, AMYLASE in the last 168 hours. No results for input(s): AMMONIA in the last 168 hours. CBC:  Recent Labs Lab 05/07/14 1457 05/08/14 1958 05/09/14 0600 05/10/14 0440 05/11/14 0500 05/13/14 0450  WBC 11.8* 11.4* 12.4* 9.7 8.3 8.2  NEUTROABS 10.5* 9.1* 11.0*  --   --   --   HGB 13.9 11.8* 12.6 10.4* 8.9* 9.3*  HCT 42.7 36.2 40.6 32.6* 28.7* 29.9*  MCV 91 91.9 93.5 93.4 94.1 94.3  PLT 260 248 271 233 208 238   Cardiac  Enzymes: No results for input(s): CKTOTAL, CKMB, CKMBINDEX, TROPONINI in the last 168 hours. BNP (last 3 results) No results for input(s): BNP in the last 8760 hours.  ProBNP (last 3 results) No results for input(s): PROBNP in the last 8760 hours.  CBG:  Recent Labs Lab 05/12/14 1205 05/12/14 1627 05/12/14 2100 05/13/14 0752 05/13/14 1215  GLUCAP 170* 115* 129* 101* 117*    Recent Results (from the past 240 hour(s))  Surgical pcr screen     Status: Abnormal   Collection Time: 05/08/14 11:29 PM  Result Value Ref Range Status   MRSA, PCR POSITIVE (A) NEGATIVE Final    Comment: RESULT CALLED TO, READ  BACK BY AND VERIFIED WITH: MERRITT,A/5E @0224  ON 05/09/14 BY KARCZEWSKI,S.    Staphylococcus aureus POSITIVE (A) NEGATIVE Final    Comment:        The Xpert SA Assay (FDA approved for NASAL specimens in patients over 52 years of age), is one component of a comprehensive surveillance program.  Test performance has been validated by [facility] for patients greater than or equal to 83 year old. It is not intended to diagnose infection nor to guide or monitor treatment.   Anaerobic culture     Status: None (Preliminary result)   Collection Time: 05/09/14  4:31 PM  Result Value Ref Range Status   Specimen Description ABSCESS LEFT THIGH  Final   Special Requests PATIENT ON FOLLOWING DOXYCYCLINE  Final   Gram Stain   Final    NO WBC SEEN NO SQUAMOUS EPITHELIAL CELLS SEEN NO ORGANISMS SEEN Performed at [facility]    Culture   Final    NO ANAEROBES ISOLATED; CULTURE IN PROGRESS FOR 5 DAYS Performed at [facility]    Report Status PENDING  Incomplete  Gram stain     Status: None   Collection Time: 05/09/14  4:31 PM  Result Value Ref Range Status   Specimen Description ABSCESS LEFT THIGH  Final   Special Requests NONE  Final   Gram Stain   Final    FEW WBC PRESENT, PREDOMINANTLY PMN NO ORGANISMS SEEN Gram Stain Report Called to,Read Back By and Verified With: LITTLE,L. RN @1755  ON 4.1.16 BY MCCOY,N.    Report Status 05/09/2014 FINAL  Final  Culture, routine-abscess     Status: None   Collection Time: 05/09/14  4:31 PM  Result Value Ref Range Status   Specimen Description ABSCESS LEFT THIGH  Final   Special Requests PATIENT ON FOLLOWING DOXYCYCLINE  Final   Gram Stain   Final    NO WBC SEEN NO SQUAMOUS EPITHELIAL CELLS SEEN NO ORGANISMS SEEN Performed at [facility]    Culture   Final    RARE STAPHYLOCOCCUS AUREUS Note: RIFAMPIN AND GENTAMICIN SHOULD NOT BE USED AS SINGLE DRUGS FOR TREATMENT OF STAPH INFECTIONS. Performed at [facility]    Report Status 05/12/2014 FINAL  Final  Tissue culture     Status: None   Collection Time: 05/09/14  4:31 PM  Result Value Ref Range Status   Specimen Description ABSCESS LEFT THIGH  Final   Special Requests PATIENT ON FOLLOWING DOXYCUCLINE  Final   Gram Stain   Final    RARE WBC PRESENT, PREDOMINANTLY MONONUCLEAR NO ORGANISMS SEEN Performed at [facility]    Culture   Final    FEW METHICILLIN RESISTANT STAPHYLOCOCCUS AUREUS Note: RIFAMPIN AND GENTAMICIN SHOULD NOT BE USED AS SINGLE DRUGS FOR TREATMENT OF STAPH INFECTIONS. This organism DOES  NOT demonstrate inducible Clindamycin resistance in vitro. CRITICAL RESULT CALLED TO, READ BACK BY AND VERIFIED WITH: Edwyna Ready RN  05/13/14 AT 8:30 AM BY MORAC DOXYCYCLINE SENSITIVE Performed at [facility]    Report Status 05/13/2014 FINAL  Final   Organism ID, Bacteria METHICILLIN RESISTANT STAPHYLOCOCCUS AUREUS  Final      Susceptibility   Methicillin resistant staphylococcus aureus - MIC*    CLINDAMYCIN <=0.25 SENSITIVE Sensitive     ERYTHROMYCIN >=8 RESISTANT Resistant     GENTAMICIN <=0.5 SENSITIVE Sensitive     LEVOFLOXACIN 4 INTERMEDIATE Intermediate     OXACILLIN >=4 RESISTANT Resistant     PENICILLIN >=0.5 RESISTANT Resistant     RIFAMPIN <=0.5 SENSITIVE Sensitive     TRIMETH/SULFA <=10 SENSITIVE Sensitive     VANCOMYCIN 1 SENSITIVE Sensitive     TETRACYCLINE <=1 SENSITIVE Sensitive     * FEW METHICILLIN RESISTANT STAPHYLOCOCCUS AUREUS     Studies: No results found.  Scheduled Meds: . azithromycin  500 mg Oral QHS  . Chlorhexidine Gluconate Cloth  6 each Topical Q0600  . clonazePAM  1 mg Oral BID  . doxycycline  100 mg Oral Q12H  . DULoxetine  60 mg Oral BID  . enoxaparin (LOVENOX) injection  40 mg Subcutaneous Q24H  . fentaNYL  25 mcg Transdermal Q72H  . gabapentin  800 mg Oral TID  . insulin aspart  0-9 Units Subcutaneous TID WC  . mupirocin ointment  1 application Nasal BID  .  pantoprazole  40 mg Oral Daily  . predniSONE  10 mg Oral Q breakfast  . rOPINIRole  1 mg Oral QHS  . traZODone  150-300 mg Oral QHS   Continuous Infusions:    Principal Problem:   Major depression, recurrent, chronic Active Problems:   Chronic pain syndrome   SLE-on prednisone-Follow at [facility]   Mycobacterium fortuitum infection   Abscess of left thigh   Diabetes mellitus type 2, controlled   Infection   Drug-seeking behavior    Time spent: 40 min    Kelvin Cellar  [facility] Pager [phone]. If 7PM-7AM, please contact night-coverage at [email or web address], password [facility] 05/13/2014, 3:23 PM  LOS: 5 days

## 2014-05-13 NOTE — Progress Notes (Signed)
Patient ID: [name] Carr, female   DOB: [birth year], 59 y.o.   MRN: [id number]         [facility] for Infectious Disease    Date of Admission:  05/08/2014   Total days of antibiotics 4          Principal Problem:   Major depression, recurrent, chronic Active Problems:   Mycobacterium fortuitum infection   Abscess of left thigh   Chronic pain syndrome   SLE-on prednisone-Follow at [facility]   Diabetes mellitus type 2, controlled   Infection   Drug-seeking behavior   . azithromycin  500 mg Oral QHS  . Chlorhexidine Gluconate Cloth  6 each Topical Q0600  . clonazePAM  1 mg Oral BID  . doxycycline  100 mg Oral Q12H  . DULoxetine  60 mg Oral BID  . enoxaparin (LOVENOX) injection  40 mg Subcutaneous Q24H  . fentaNYL  25 mcg Transdermal Q72H  . gabapentin  800 mg Oral TID  . insulin aspart  0-9 Units Subcutaneous TID WC  . mupirocin ointment  1 application Nasal BID  . pantoprazole  40 mg Oral Daily  . predniSONE  10 mg Oral Q breakfast  . rOPINIRole  1 mg Oral QHS  . traZODone  150-300 mg Oral QHS    Subjective: She just met with the psychiatrist.   Review of Systems: Pertinent items are noted in HPI.  Past Medical History  Diagnosis Date  . PERSONALITY DISORDER   . Chronic pain syndrome     chronic narcotics, dependancy hx - [facility] for pain mgmt  . MIGRAINE HEADACHE   . Lyme disease   . ALLERGIC RHINITIS   . ANEMIA-IRON DEFICIENCY   . HYPERLIPIDEMIA   . GERD   . DIABETES MELLITUS, TYPE II   . DEPRESSION   . SLE (systemic lupus erythematosus)     [facility] - chronic pred  . Psoriasis   . Addison's disease   . Degenerative joint disease   . DDD (degenerative disc disease)   . Phlebitis after infusion     left ?calf; "S/P phenergan/demerol injection"  . Blood clot in vein 2011; 02/04/11    "right jugular vein;left jugular vein"  . SEIZURE DISORDER     "lots of seizures; due to lupus"  . Anxiety   . Neuropathy     hands, feet, due to diabetes &  back problem  . Surgical wound infection 02/15/2011    R shoulder and mastectomy site - related to PICC  . CARCINOMA, BREAST 08/2009 dx    R breast,  mets to liver - resolution s/p chemo, s/p B mastect  . Metastases to the liver   . RA (rheumatoid arthritis)   . Hypothyroidism pt states she has never had hy pothyroidism  . Hypertension     History  Substance Use Topics  . Smoking status: Never Smoker   . Smokeless tobacco: Never Used     Comment: Never used tobacco  . Alcohol Use: No    Family History  Problem Relation Age of Onset  . Lung cancer Mother   . Lung cancer Father   . Arthritis Other   . Diabetes Other   . Hyperlipidemia Other    Allergies  Allergen Reactions  . Ammonia Other (See Comments)    Ended up on ventilator from ammonia tabs  . Contrast Media [Iodinated Diagnostic Agents] Other (See Comments)    Doesn't breath well.  . Iohexol Other (See Comments)  SOB   . Methotrexate Anaphylaxis  . Midazolam Hcl Anaphylaxis  . Nalbuphine Other (See Comments)    Can't breathe well  . Infliximab Nausea And Vomiting  . Ketorolac Tromethamine Other (See Comments)    Injectable doesn't work and pill hurts stomach.  . Morphine And Related Hives  . Nsaids Nausea And Vomiting  . Celecoxib Rash    OBJECTIVE: Blood pressure 128/53, pulse 70, temperature 98.4 F (36.9 C), temperature source Oral, resp. rate 20, height 5' 5" (1.651 m), weight 170 lb 1.6 oz (77.157 kg), SpO2 100 %. General: She was initially tearful and withdrawn when I entered the room. However we ended up having a very reasonable and calm conversation.  Lab Results Lab Results  Component Value Date   WBC 8.2 05/13/2014   HGB 9.3* 05/13/2014   HCT 29.9* 05/13/2014   MCV 94.3 05/13/2014   PLT 238 05/13/2014    Lab Results  Component Value Date   CREATININE 0.89 05/13/2014   BUN 20 05/13/2014   NA 139 05/13/2014   K 3.8 05/13/2014   CL 94* 05/13/2014   CO2 37* 05/13/2014    Lab Results    Component Value Date   ALT 18 05/09/2014   AST 21 05/09/2014   ALKPHOS 101 05/09/2014   BILITOT 0.4 05/09/2014     Microbiology: Recent Results (from the past 240 hour(s))  Surgical pcr screen     Status: Abnormal   Collection Time: 05/08/14 11:29 PM  Result Value Ref Range Status   MRSA, PCR POSITIVE (A) NEGATIVE Final    Comment: RESULT CALLED TO, READ BACK BY AND VERIFIED WITH: MERRITT,A/5E @0224 ON 05/09/14 BY KARCZEWSKI,S.    Staphylococcus aureus POSITIVE (A) NEGATIVE Final    Comment:        The Xpert SA Assay (FDA approved for NASAL specimens in patients over 21 years of age), is one component of a comprehensive surveillance program.  Test performance has been validated by [facility] for patients greater than or equal to 1 year old. It is not intended to diagnose infection nor to guide or monitor treatment.   Anaerobic culture     Status: None (Preliminary result)   Collection Time: 05/09/14  4:31 PM  Result Value Ref Range Status   Specimen Description ABSCESS LEFT THIGH  Final   Special Requests PATIENT ON FOLLOWING DOXYCYCLINE  Final   Gram Stain   Final    NO WBC SEEN NO SQUAMOUS EPITHELIAL CELLS SEEN NO ORGANISMS SEEN Performed at [facility]    Culture   Final    NO ANAEROBES ISOLATED; CULTURE IN PROGRESS FOR 5 DAYS Performed at [facility]    Report Status PENDING  Incomplete  Gram stain     Status: None   Collection Time: 05/09/14  4:31 PM  Result Value Ref Range Status   Specimen Description ABSCESS LEFT THIGH  Final   Special Requests NONE  Final   Gram Stain   Final    FEW WBC PRESENT, PREDOMINANTLY PMN NO ORGANISMS SEEN Gram Stain Report Called to,Read Back By and Verified With: LITTLE,L. RN @1755 ON 4.1.16 BY MCCOY,N.    Report Status 05/09/2014 FINAL  Final  Culture, routine-abscess     Status: None   Collection Time: 05/09/14  4:31 PM  Result Value Ref Range Status   Specimen Description ABSCESS LEFT THIGH   Final   Special Requests PATIENT ON FOLLOWING DOXYCYCLINE  Final   Gram Stain   Final      NO WBC SEEN NO SQUAMOUS EPITHELIAL CELLS SEEN NO ORGANISMS SEEN Performed at [facility]    Culture   Final    RARE STAPHYLOCOCCUS AUREUS Note: RIFAMPIN AND GENTAMICIN SHOULD NOT BE USED AS SINGLE DRUGS FOR TREATMENT OF STAPH INFECTIONS. Performed at [facility]    Report Status 05/12/2014 FINAL  Final  Tissue culture     Status: None   Collection Time: 05/09/14  4:31 PM  Result Value Ref Range Status   Specimen Description ABSCESS LEFT THIGH  Final   Special Requests PATIENT ON FOLLOWING DOXYCUCLINE  Final   Gram Stain   Final    RARE WBC PRESENT, PREDOMINANTLY MONONUCLEAR NO ORGANISMS SEEN Performed at [facility]    Culture   Final    FEW METHICILLIN RESISTANT STAPHYLOCOCCUS AUREUS Note: RIFAMPIN AND GENTAMICIN SHOULD NOT BE USED AS SINGLE DRUGS FOR TREATMENT OF STAPH INFECTIONS. This organism DOES NOT demonstrate inducible Clindamycin resistance in vitro. CRITICAL RESULT CALLED TO, READ BACK BY AND VERIFIED WITH: NAKIYHA D RN  05/13/14 AT 8:30 AM BY MORAC DOXYCYCLINE SENSITIVE Performed at [facility]    Report Status 05/13/2014 FINAL  Final   Organism ID, Bacteria METHICILLIN RESISTANT STAPHYLOCOCCUS AUREUS  Final      Susceptibility   Methicillin resistant staphylococcus aureus - MIC*    CLINDAMYCIN <=0.25 SENSITIVE Sensitive     ERYTHROMYCIN >=8 RESISTANT Resistant     GENTAMICIN <=0.5 SENSITIVE Sensitive     LEVOFLOXACIN 4 INTERMEDIATE Intermediate     OXACILLIN >=4 RESISTANT Resistant     PENICILLIN >=0.5 RESISTANT Resistant     RIFAMPIN <=0.5 SENSITIVE Sensitive     TRIMETH/SULFA <=10 SENSITIVE Sensitive     VANCOMYCIN 1 SENSITIVE Sensitive     TETRACYCLINE <=1 SENSITIVE Sensitive     * FEW METHICILLIN RESISTANT STAPHYLOCOCCUS AUREUS    Assessment: Her operative cultures are growing methicillin-resistant staph aureus and 2 previous  cultures grew Mycobacterium fortuitum which I'm sure is still present. I talked to her at length again about how her infection should be treatable with oral antibiotic therapy. This will allow her to go home and avoid the potential complications of a PICC. She told me that no one had ever told her that there are potential risks associated with having a PICC in. I told her that it is very unusual for someone not to be able to tolerate any type of oral antibiotic. She agreed that that does sound unusual and she states that she will try oral antibiotic therapy again. I would recommend seeing how she does overnight on oral azithromycin and doxycycline. If she does okay then I would have the PICC removed and send her home tomorrow.  Plan: 1. Continue oral antibiotics   , [facility] [phone] pager   [phone] cell 05/13/2014, 3:20 PM 

## 2014-05-13 NOTE — Progress Notes (Signed)
CARE MANAGEMENT NOTE 05/13/2014  Patient:  [name] Carr, [name] Carr   Account Number:  [id number]  Date Initiated:  05/13/2014  Documentation initiated by:  Edwyna Shell  Subjective/Objective Assessment:   59 yo female admitted with abscess of left thigh from home     Action/Plan:   discharge planning   Anticipated DC Date:  05/14/2014   Anticipated DC Plan:  [place]  CM consult      Choice offered to / List presented to:             Status of service:  In process, will continue to follow Medicare Important Message given?   (If response is "NO", the following Medicare IM given date fields will be blank) Date Medicare IM given:   Medicare IM given by:   Date Additional Medicare IM given:   Additional Medicare IM given by:    Discharge Disposition:    Per UR Regulation:    If discussed at Long Length of Stay Meetings, dates discussed:    Comments:  05/13/14 Edwyna Shell RN BSN CM [phone] Patient stated that she only agreed to go to a SNF because she thought they would manage her PICC line there. She stated that if she is not dc'd with her PICC then there is not need for a SNF. She stated that she does not need rehab and that she can ambulate "just fine" and that PT "evaluated at a bad time". Patient states that she lives alone and used to have personal care services from [place] but they stopped and she is currently in an appeal process. Patient stated that she is aware that [facility] and [facility] will not see her again. Explained to patient that [place] stated they can provide [facility] services. Patient is agreeable to [facility] services if needed upon discharge. Will continue to follow for [facility] needs.

## 2014-05-14 DIAGNOSIS — F339 Major depressive disorder, recurrent, unspecified: Secondary | ICD-10-CM

## 2014-05-14 LAB — ANAEROBIC CULTURE: Gram Stain: NONE SEEN

## 2014-05-14 LAB — GLUCOSE, CAPILLARY
GLUCOSE-CAPILLARY: 224 mg/dL — AB (ref 70–99)
Glucose-Capillary: 112 mg/dL — ABNORMAL HIGH (ref 70–99)

## 2014-05-14 MED ORDER — DOXYCYCLINE HYCLATE 100 MG PO TABS: 100.0000 mg | ORAL_TABLET | Freq: Two times a day (BID) | ORAL | Status: DC

## 2014-05-14 MED ORDER — AZITHROMYCIN 500 MG PO TABS: 500.0000 mg | ORAL_TABLET | Freq: Every day | ORAL | Status: DC

## 2014-05-14 MED ORDER — POTASSIUM CHLORIDE CRYS ER 20 MEQ PO TBCR: EXTENDED_RELEASE_TABLET | ORAL | Status: DC

## 2014-05-14 MED ORDER — ACETAMINOPHEN 500 MG PO TABS
500.0000 mg | ORAL_TABLET | Freq: Four times a day (QID) | ORAL | Status: DC | PRN
Start: 2014-05-14 — End: 2015-09-21

## 2014-05-14 NOTE — Progress Notes (Addendum)
Clinical Social Work Department BRIEF PSYCHOSOCIAL ASSESSMENT 05/14/2014  Patient:  [name] Carr, [name] Carr     Account Number:  [id number]     Admit date:  05/08/2014  Clinical Social Worker:  Peri Maris, [place]  Date/Time:  05/13/2014 02:30 PM  Referred by:  Physician  Date Referred:  05/12/2014 Referred for  SNF Placement   Other Referral:   Interview type:  Patient Other interview type:    PSYCHOSOCIAL DATA Living Status:  ALONE Admitted from facility:   Level of care:   Primary support name:  No specific name provided Primary support relationship to patient:  FRIEND Degree of support available:   Pt reports that she has several friends and neighbors who provide support for her in the home.    CURRENT CONCERNS Current Concerns  Post-Acute Placement   Other Concerns:    SOCIAL WORK ASSESSMENT / PLAN CSW met with Pt to discuss referral to SNF as PT and MD recommended this for continued rehabilitation. Pt was initially agreeable, so CSW faxed Pt referral to SNF facilities in [facility]. However, when CSW met with Pt later, she reported that she was no longer in agreement with SNF placement as she had been made aware that she would not be able to keep her PIC line to receive IV antibiotics. CSW and Case Management discussed home health options with the Pt.    CSW informed Pt of bed offers and she continued to decline placement.   Assessment/plan status:  Psychosocial Support/Ongoing Assessment of Needs Other assessment/ plan:   Information/referral to community resources:   List of facilties in [place].    PATIENT'S/FAMILY'S RESPONSE TO PLAN OF CARE: Pt was cooperative overall. However she expressed dissatisfaction with the care she had received while in the hospital and described feelings of betrayal and powerlessness in regards to medical decisions. Pt politely declined SNF placement and was reluctant to accept home health services. Pt appeared  worried about her course of medical treatment. However, she was appreciative of CSW services.     Peri Maris, [place] [phone]

## 2014-05-14 NOTE — Progress Notes (Signed)
Physical Therapy Treatment Patient Details Name: [name] Carr MRN: [id number] DOB: Oct 16, 1955 Today's Date: 05/14/2014    History of Present Illness s/p L thigh abscess post I and D.  Pt with history of thigh I and D, lupus, chronic pain and Breast cancer    PT Comments    Pt tolerated session today, pleasant. First stated she has not been allow to walk to the bathroom, only pivot to bedside commode, however she later told me that she had been walking back and forth to the bathroom and got herself dressed with no concern. She stated she doesn't feel she will have any issues with getting around in her home, that she doesn't feel weak, she is more concerned and focused on keeping her medicines down in order to fight the infection she has in her leg. WE reviewed mobility of her leg and to move within tolerance for ankle pumps and knee flexion and heel slides. She agreed and understood. She stated she will be fine on her steps to enter the house for she has a neighbor that helps with that and she has a railing.   Follow Up Recommendations  Home health PT     Equipment Recommendations  None recommended by PT    Recommendations for Other Services OT consult     Precautions / Restrictions      Mobility  Bed Mobility Overal bed mobility: Independent       Supine to sit: Independent Sit to supine: Independent      Transfers Overall transfer level: Independent Equipment used: Rolling walker (2 wheeled);Straight cane   Sit to Stand: Independent         General transfer comment: steady gettingup and down from the bed at different times during session  Ambulation/Gait Ambulation/Gait assistance: Modified independent (Device/Increase time) Ambulation Distance (Feet): 60 Feet (limited distance per pt becasue she didn't want to wear herself out before she had to go home) Assistive device: Standard walker;Straight cane       General Gait Details: used RW part distance and then  switched to cane. She was steady on both and hard to get a straight answer on which one she uses at home. She stated she uses RW , cane and sometimes her scooter, however also stated that she likes to stay ont eh move and she is not worried about her getting around, that she doesn't really feel weak at all. It suprised her.    Stairs            Wheelchair Mobility    Modified Rankin (Stroke Patients Only)       Balance                               High Level Balance Comments: in room balance with turnign and sitting, etc, seemed WNL and functional.     Cognition Arousal/Alertness: Awake/alert Behavior During Therapy: WFL for tasks assessed/performed Overall Cognitive Status: Within Functional Limits for tasks assessed                      Exercises      General Comments        Pertinent Vitals/Pain Pain Assessment: Faces Faces Pain Scale: Hurts a little bit Pain Location: L thigh burns so much and certain moves she states makes her "sciatica" hurt down the R buttocks area Pain Descriptors / Indicators: Aching Pain Intervention(s): Premedicated before session (nurse had  just given pain meds)    Home Living                      Prior Function            PT Goals (current goals can now be found in the care plan section) Progress towards PT goals: Progressing toward goals    Frequency  Min 3X/week    PT Plan Discharge plan needs to be updated    Co-evaluation             End of Session     Patient left: in bed     Time: [id number] PT Time Calculation (min) (ACUTE ONLY): 21 min  Charges:  $Gait Training: 8-22 mins                    G CodesClide Dales 10-Jun-2014, 3:35 PM  Clide Dales, PT Pager: [phone] 10-Jun-2014

## 2014-05-14 NOTE — Progress Notes (Signed)
Patient given discharge instructions, and verbalized an understanding of all discharge instructions.  Patient agrees with discharge plan, and is being discharged in stable medical condition.  Patient given transportation via wheelchair.  Of note patient adamantly refused that secretary set up appointments, and stated that she would arrange to set up appointments.  Patient aware that she will need to follow up with PCP, and orthopedic Surgeon.  Durwin Nora RN

## 2014-05-14 NOTE — Progress Notes (Signed)
CSW met with Pt regarding scheduling an outpatient psychiatric appointment for medication management and therapy. Pt reported that she would prefer to schedule the appointment herself. CSW provided her with contact information for [facility] in [facility] as they have providers that are able to see Pt's with Medicare. Pt was appreciative of the information and promised that she would utilize the information.   Pt to discharge home today with home health services.  CSW is signing off but available if needs arise.   Peri Maris, [place] 05/14/2014 11:15 AM [phone]

## 2014-05-14 NOTE — Progress Notes (Signed)
Per MD order, PICC line removed. Cath intact at 38cm. Vaseline pressure gauze to site, pressure held x 5min. No bleeding to site. Pt instructed to keep dressing CDI x 24 hours. Avoid heavy lifting, pushing or pulling x 24 hours,  If bleeding occurs hold pressure, if bleeding does not stop contact MD or go to the ED. Pt does not have any questions. Yatzil Clippinger M  

## 2014-05-14 NOTE — Progress Notes (Signed)
CARE MANAGEMENT NOTE 05/14/2014  Patient:  [name] Carr, [name] Carr   Account Number:  [id number]  Date Initiated:  05/13/2014  Documentation initiated by:  Edwyna Shell  Subjective/Objective Assessment:   59 yo female admitted with abscess of left thigh from home     Action/Plan:   discharge planning   Anticipated DC Date:  05/14/2014   Anticipated DC Plan:  [place]  CM consult      [place]   Choice offered to / List presented to:  C-1 Patient        [place] arranged  HH-1 RN  [place]      [place] agency  [place]   Status of service:  In process, will continue to follow Medicare Important Message given?   (If response is "NO", the following Medicare IM given date fields will be blank) Date Medicare IM given:   Medicare IM given by:   Date Additional Medicare IM given:   Additional Medicare IM given by:    Discharge Disposition:    Per UR Regulation:    If discussed at Long Length of Stay Meetings, dates discussed:    Comments:  05/14/14 Edwyna Shell RN BSN CM [phone] Followed up with patient and attending and staff RN and patient agreeable to plan with DC home with oral antibiotics and [place] services for disease and medications management in the home. Patient stated that she agrees [facility] would be the best [facility] agency and is aware that [facility] and [facility] will no longer provide [facility] services. Communicated referral to [facility], [place]. Patient stated that she walready has a RW, cane, and electric wheelchair at home.  05/13/14 Edwyna Shell RN BSN CM [phone] Patient stated that she only agreed to go to a SNF because she thought they would manage her PICC line there. She stated that if she is not dc'd with her PICC then there is not need for a SNF. She stated that she does not need rehab and that she can ambulate "just fine" and that PT "evaluated at a bad time".  Patient states that she lives alone and used to have personal care services from [place] but they stopped and she is currently in an appeal process. Patient stated that she is aware that [facility] and [facility] will not see her again. Explained to patient that [place] stated they can provide [facility] services. Patient is agreeable to [facility] services if needed upon discharge. Will continue to follow for [facility] needs.

## 2014-05-14 NOTE — Discharge Summary (Signed)
Physician Discharge Summary  [name] Carr IEP:[id number] DOB: [birth year] DOA: 05/08/2014  PCP: [name], MD  Admit date: 05/08/2014 Discharge date: 05/14/2014  Time spent: Greater than 30 minutes  Recommendations for Outpatient Follow-up:  1. Dr. [name], PCP in 5 days with repeat labs (CBC & BMP) 2. Dr. Meredith Pel, Orthopedics in 10 days for postop follow-up and suture removal. 3. Dr. Michel Bickers, Infectious disease: MDs office will arrange follow-up 4. Dr. Marciano Sequin, pain management: Patient to keep previous appointment on 05/23/14 at 2:15 PM 5. Home health  Discharge Diagnoses:  Principal Problem:   Major depression, recurrent, chronic Active Problems:   Chronic pain syndrome   SLE-on prednisone-Follow at [facility]   Mycobacterium fortuitum infection   Abscess of left thigh   Diabetes mellitus type 2, controlled   Infection   Drug-seeking behavior   Discharge Condition: Improved & Stable  Diet recommendation: Heart healthy and diabetic diet.  Filed Weights   05/08/14 2302  Weight: 77.157 kg (170 lb 1.6 oz)    History of present illness:  Patient is a pleasant 59 year old female with a past medical history of left thigh infection status post incision and drainage in November 2015 who was admitted to medicine service on 05/08/2014 presenting with complaints of left thigh pain and swelling. She had a CT scan of her left thigh ordered by her primary care provider that showed recurrent deep soft tissue abscess in the vastus lateralis and rectus femoris. Abscess demarcated by fluid and gas within the anterior lateral musculature of the left thigh. She was instructed by her primary care provider to present to the emergency department. Patient was seen and evaluated by orthopedic surgery and she was taken to the operating room for extensive excisional debridement on 05/10/2014, procedure performed by Dr Marlou Sa of orthopedic surgery. Infectious disease was  consulted and he recommended continuing IV doxycycline and azithromycin. Throughout this hospitalization patient voicing significant concerns over her pain regimen, asking multiple questions related to IV dilaudid and becoming visibly upset with decreasing IV Dilaudid dose despite being on Norco and fentanyl patch. Dr Megan Salon expressed concerns over her being discharged on a PICC line, feeling that this was not safe given concerns from multiple providers for PICC line manipulating in past. She requested being discharged on a PICC line on the grounds that she could not tolerate oral antimicrobial therapy however could tolerate other oral medications and food/drink. On 05/13/2014 after extensive discussion with Dr Megan Salon she agreed to give oral antibiotic therapy a try. She tolerated PO Abx and was DC'ed home.  Hospital Course:   Left thigh abscess - Patient has history of deep soft tissue abscess of left thigh, admitted back in November 2015 undergoing incision and drainage on 12/22/2013 and cultures grew Mycobacterium fortuitum. - At the time she was discharged on IV Teflaro after PICC line placement. She had been following Dr. Megan Salon of infectious disease at the clinic. - She now presented with recurrent left thigh abscess as demonstrated by CT scan. Orthopedic surgery consulted and she underwent extensive excisional debridement on 05/10/14.  - Infectious disease consulted and patient was treated empirically with IV azithromycin and doxycycline. -  Tissue cultures growing MRSA, susceptible to Doxy - She was transitioned to PO doxy and azithromycin, which she tolerated  - As per infectious disease, her operative cultures grew MRSA and 2 previous cultures grew Mycobacterium Fortuitum. Discussed with Dr. Nicolette Bang on discharge day and he recommended discharge on oral doxycycline and azithromycin for 30 days which  will be reevaluated at outpatient ID clinic follow-up  SLE/Sjogren's syndrome - Continue  oral Prednisone 10 mg PO q daily  Hypokalemia - Replaced  Polypharmacy/drug-seeking behavior - Dr. Coralyn Pear held extensive discussion with patient regarding multiple psychotropic medications. She reports having a "tolerance" to narcotic analgesics. - Dr. Coralyn Pear spoke to Dr. Megan Salon of infectious disease, patient having a long history of drug-seeking behavior and PICC line manipulation. In the past she has reported intolerance to oral antimicrobial therapy and has made specific requests for PICC line placement, with providers noticing no issues taking other oral meds and her being lost to follow-up. Multiple phone messages left on Epic by her providers. Dr. Megan Salon did not recommend discharging her on a PICC line and that oral antimicrobial therapy should be prescribed.  -On 05/12/2014 she requested that Dr/ Coralyn Pear increase her ID dilaudid dose, stating having significant pain. On his evaluation her complaints appeared to outweigh physical exam findings. Additionally RN reporting that she had been completing her meals with out any issues. He explained that her pain was being addressed with the administration of Norco 10/325 and Fentanyl patch, along with gabapentin 800 mg PO TID and zanaflex. He explained that IV narcotic medication should be tapered off, especially with her tolerating PO. She became visibly upset with him. She also requested IV phenergan stating zofran did not work.   - On 05/13/2014 patient seen by psychiatry. She was amenable to give an oral antibiotic therapy a try. IV antimicrobial therapy was discontinued, transitoned to PO.   - On day of discharge, patient was evaluated along with her nurse and case manager in room. She expressed that she tolerated her oral medications but was concerned that she may not do so on a consistent basis. Advised her that if that became the case, she can follow-up with her PCP for medication changes. She was also advised to follow-up with her pain M.D. and  that no pain medications would be prescribed for her from the hospital at discharge. She was agreeable to this plan.  Anemia - Stable. - Follow CBCs as outpatient   Essential hypertension - Controlled - Continue HCTZ  Type II DM - Diet controlled at home. Reasonable inpatient control  History of chronic peripheral edema - Did not seem to have significant edema at this time. - Discharged only on HCTZ. Discontinued Lasix and Aldactone which she did not seem to be taking. - Reassess during outpatient follow-up.  7 mm hyperechoic focus in right hepatic lobe on ultrasound - As per report, compatible with hemangioma. However hepatic protocol MRI may be performed as outpatient for further evaluation.   Consultations:  Infectious disease  Orthopedics  Psychiatry  Procedures:  Excisional debridement of left thigh abscess on 05/10/14   Discharge Exam:  Complaints:  Patient complained of intermittent moderate pain at operated site. Denied nausea or vomiting. Tolerated diet and oral antibiotics. As per nursing, no acute events.  Filed Vitals:   05/13/14 0627 05/13/14 1605 05/13/14 2230 05/14/14 0520  BP: 128/53 130/56 136/63 133/63  Pulse: 70 80 69 62  Temp: 98.4 F (36.9 C) 98 F (36.7 C) 97.9 F (36.6 C) 97.5 F (36.4 C)  TempSrc: Oral Oral Oral Oral  Resp: 20 19 20 20   Height:      Weight:      SpO2: 100% 100% 99% 96%    General exam: Pleasant middle-aged female sitting up comfortably in bed Respiratory system: Clear. No increased work of breathing. Cardiovascular system: S1 & S2 heard,  RRR. No JVD, murmurs, gallops, clicks or pedal edema. Gastrointestinal system: Abdomen is nondistended, soft and nontender. Normal bowel sounds heard. Central nervous system: Alert and oriented. No focal neurological deficits. Extremities: Symmetric 5 x 5 power. Left thigh surgical site sutures intact without any acute findings or drainage.  Discharge Instructions      Discharge  Instructions    Call MD for:  extreme fatigue    Complete by:  As directed      Call MD for:  persistant dizziness or light-headedness    Complete by:  As directed      Call MD for:  persistant nausea and vomiting    Complete by:  As directed      Call MD for:  redness, tenderness, or signs of infection (pain, swelling, redness, odor or green/yellow discharge around incision site)    Complete by:  As directed      Call MD for:  severe uncontrolled pain    Complete by:  As directed      Call MD for:  temperature >100.4    Complete by:  As directed      Change dressing (specify)    Complete by:  As directed   Dressing change: One time per day using dry dressing. May shower but pat dry left thigh surgical incision site.     Diet - low sodium heart healthy    Complete by:  As directed      Diet Carb Modified    Complete by:  As directed      Increase activity slowly    Complete by:  As directed             Medication List    STOP taking these medications        alum & mag hydroxide-simeth 200-200-20 MG/5ML suspension  Commonly known as:  MAALOX/MYLANTA     azithromycin 200 MG/5ML suspension  Commonly known as:  ZITHROMAX  Replaced by:  azithromycin 500 MG tablet     furosemide 20 MG tablet  Commonly known as:  LASIX     nystatin 100000 UNIT/GM Powd     spironolactone 25 MG tablet  Commonly known as:  ALDACTONE      TAKE these medications        acetaminophen 500 MG tablet  Commonly known as:  TYLENOL  Take 1 tablet (500 mg total) by mouth every 6 (six) hours as needed for mild pain or fever. Total daily tylenol dose from all medications should not exceed 4 gm. Please follow up with your pain MD.     azithromycin 500 MG tablet  Commonly known as:  ZITHROMAX  Take 1 tablet (500 mg total) by mouth at bedtime.     clonazePAM 1 MG tablet  Commonly known as:  KLONOPIN  Take one tablet by mouth twice daily     docusate sodium 100 MG capsule  Commonly known as:   COLACE  Take 200 mg by mouth 2 (two) times daily.     doxycycline 100 MG tablet  Commonly known as:  VIBRA-TABS  Take 1 tablet (100 mg total) by mouth 2 (two) times daily.     DULoxetine 60 MG capsule  Commonly known as:  CYMBALTA  Take 1 capsule (60 mg total) by mouth 2 (two) times daily.     EPIPEN 0.3 mg/0.3 mL Soaj injection  Generic drug:  EPINEPHrine  Inject 0.3 mg into the muscle once.     fentaNYL 25 MCG/HR patch  Commonly known as:  [place] - dosed mcg/hr  Place 25 mcg onto the skin every 3 (three) days.     gabapentin 800 MG tablet  Commonly known as:  NEURONTIN  Take 800 mg by mouth 3 (three) times daily.     hydrochlorothiazide 25 MG tablet  Commonly known as:  HYDRODIURIL  Take 25 mg by mouth.     HYDROcodone-acetaminophen 10-325 MG per tablet  Commonly known as:  NORCO  Take 1 tablet by mouth every 4 (four) hours as needed for moderate pain or severe pain.     pantoprazole 40 MG tablet  Commonly known as:  PROTONIX  Take 40 mg by mouth daily.     potassium chloride SA 20 MEQ tablet  Commonly known as:  K-DUR,KLOR-CON  20 MEQ daily     predniSONE 5 MG tablet  Commonly known as:  DELTASONE  Take 10 mg by mouth daily with breakfast.     PRESCRIPTION MEDICATION  Supportive therapy [facility]     promethazine 25 MG suppository  Commonly known as:  PHENERGAN  Place 25 mg rectally every 6 (six) hours as needed for nausea or vomiting.     rOPINIRole 1 MG tablet  Commonly known as:  REQUIP  Take 1 mg by mouth at bedtime.     senna-docusate 8.6-50 MG per tablet  Commonly known as:  Senokot-S  Take 1 tablet by mouth 2 (two) times daily as needed for mild constipation.     tiZANidine 4 MG tablet  Commonly known as:  ZANAFLEX  Take 4 mg by mouth every 6 (six) hours as needed for muscle spasms.     traZODone 150 MG tablet  Commonly known as:  DESYREL  Take 150-300 mg by mouth at bedtime.       Follow-up Information    Follow up with PARUCHURI,VAMSEE,  MD. Schedule an appointment as soon as possible for a visit in 5 days.   Specialty:  Internal Medicine   Why:  To be seen with repeat labs (CBC & BMP). Hospital follow up.      Follow up with Meredith Pel, MD. Schedule an appointment as soon as possible for a visit in 10 days.   Specialty:  Orthopedic Surgery   Why:  Suture removal and post op follow up.   Contact information:   [place] [address] [phone]       Follow up with Michel Bickers, MD.   Specialty:  Infectious Diseases   Why:  MD's office will call you with appointment.   Contact information:   [address] [place] [address] [phone]       Follow up with Aloha Gell, MD On 05/23/2014.   Specialty:  Pain Medicine   Why:  Keep previous appointment at 2:15 PM.   Contact information:   [address] [place] [address] [phone]        The results of significant diagnostics from this hospitalization (including imaging, microbiology, ancillary and laboratory) are listed below for reference.    Significant Diagnostic Studies: US Abdomen Complete  05/07/2014   CLINICAL DATA:  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 ; interval negative CT scans  EXAM: ULTRASOUND ABDOMEN COMPLETE  COMPARISON:  None.  FINDINGS: Gallbladder: The gallbladder is adequately distended. There are no stones. There is no wall thickening or pericholecystic fluid. There is no positive sonographic Murphy's sign.  Common bile duct: Diameter: 2 mm  Liver: The hepatic  echotexture is normal. There is a 7 mm hyperechoic structure in the right lobe without increased vascularity.  IVC: Partially obscured by bowel gas.  Pancreas: Evaluation the pancreas was limited by bowel gas.  Spleen: Size and appearance within normal limits.  Right Kidney: Length: 11 mm. Echogenicity within normal limits. No mass or hydronephrosis visualized.  Left Kidney: Length: 10.7 mm. Echogenicity within normal  limits. No mass or hydronephrosis visualized.  Abdominal aorta: Evaluation of the abdominal aorta was limited by bowel gas. The proximal aorta exhibits a maximum of 2.4 cm in diameter.  Other findings: No ascites is demonstrated.  IMPRESSION: 1. There is a 7 mm hyperechoic focus in the right hepatic lobe. Statistically this is most compatible with a hemangioma. No abnormalities elsewhere in the liver are demonstrated. For further evaluation of the hepatic parenchyma a hepatic protocol MRI would be useful. 2. No acute abnormality is demonstrated elsewhere within the abdomen. Bowel gas limits the study somewhat.   Electronically Signed   By: David  Martinique   On: 05/07/2014 14:37   Ct Femur Left Wo Contrast  05/07/2014   CLINICAL DATA:  Previous incision and drainage of the lateral LEFT thigh. Open thigh wound, left, sequela S71.102S (ICD-10-CM)  EXAM: CT OF THE LEFT FEMUR WITHOUT CONTRAST  TECHNIQUE: Multidetector CT imaging was performed according to the standard protocol. Multiplanar CT image reconstructions were also generated.  COMPARISON:  12/25/2013.  FINDINGS: Extensive subcutaneous calcification is present, likely due to a combination of heterotopic bone, dystrophic calcification and injection granulomata. Infiltration of the subcutaneous tissues is present in the anterior lateral LEFT thigh. This is the area of previously seen open wound extending deep to some of the calcifications.  There is an ellipsoid fluid collection just deep to the vastus lateralis muscular fascia. Small locules of gas are present in this location. When measured craniocaudal, this is 8.2 cm. On axial image 98, the collection measures 18 mm transverse by 5 cm AP. No subcutaneous fluid collection is identified. Gas tracks around to the rectus femoris muscle, consistent with extension of the abscess through the anterior compartment.  Phlegmon is present in the soft tissues, likely representing cellulitis although nonspecific. Edema and  postoperative changes can produce this appearance as well. The visceral pelvis appears within normal limits. Mild symmetric atrophy of the anterior muscular compartment is present bilaterally. The RIGHT thigh is partially visible.  IMPRESSION: 1. Recurrent deep soft tissue abscess in the vastus lateralis and rectus femoris, just deep to the superficial muscular fascia. On this noncontrast study, the abscess is demarcated by fluid and gas within the anterior lateral musculature of the LEFT thigh. 2. No subcutaneous abscess. Infiltration of the subcutaneous tissues may represent scarring or more likely cellulitis in the setting of deep abscess. 3. Dystrophic calcifications throughout the subcutaneous tissues. Potentially, these could serve as a nidus for recurrent infection.   Electronically Signed   By: Dereck Ligas M.D.   On: 05/07/2014 15:17    Microbiology: Recent Results (from the past 240 hour(s))  Surgical pcr screen     Status: Abnormal   Collection Time: 05/08/14 11:29 PM  Result Value Ref Range Status   MRSA, PCR POSITIVE (A) NEGATIVE Final    Comment: RESULT CALLED TO, READ BACK BY AND VERIFIED WITH: MERRITT,A/5E @0224  ON 05/09/14 BY KARCZEWSKI,S.    Staphylococcus aureus POSITIVE (A) NEGATIVE Final    Comment:        The Xpert SA Assay (FDA approved for NASAL specimens in patients over 21  years of age), is one component of a comprehensive surveillance program.  Test performance has been validated by [facility] for patients greater than or equal to 44 year old. It is not intended to diagnose infection nor to guide or monitor treatment.   Anaerobic culture     Status: None   Collection Time: 05/09/14  4:31 PM  Result Value Ref Range Status   Specimen Description ABSCESS LEFT THIGH  Final   Special Requests PATIENT ON FOLLOWING DOXYCYCLINE  Final   Gram Stain   Final    NO WBC SEEN NO SQUAMOUS EPITHELIAL CELLS SEEN NO ORGANISMS SEEN Performed at [facility]     Culture   Final    NO ANAEROBES ISOLATED Performed at [facility]    Report Status 05/14/2014 FINAL  Final  Gram stain     Status: None   Collection Time: 05/09/14  4:31 PM  Result Value Ref Range Status   Specimen Description ABSCESS LEFT THIGH  Final   Special Requests NONE  Final   Gram Stain   Final    FEW WBC PRESENT, PREDOMINANTLY PMN NO ORGANISMS SEEN Gram Stain Report Called to,Read Back By and Verified With: LITTLE,L. RN @1755  ON 4.1.16 BY MCCOY,N.    Report Status 05/09/2014 FINAL  Final  Culture, routine-abscess     Status: None   Collection Time: 05/09/14  4:31 PM  Result Value Ref Range Status   Specimen Description ABSCESS LEFT THIGH  Final   Special Requests PATIENT ON FOLLOWING DOXYCYCLINE  Final   Gram Stain   Final    NO WBC SEEN NO SQUAMOUS EPITHELIAL CELLS SEEN NO ORGANISMS SEEN Performed at [facility]    Culture   Final    RARE STAPHYLOCOCCUS AUREUS Note: RIFAMPIN AND GENTAMICIN SHOULD NOT BE USED AS SINGLE DRUGS FOR TREATMENT OF STAPH INFECTIONS. Performed at [facility]    Report Status 05/12/2014 FINAL  Final  Tissue culture     Status: None   Collection Time: 05/09/14  4:31 PM  Result Value Ref Range Status   Specimen Description ABSCESS LEFT THIGH  Final   Special Requests PATIENT ON FOLLOWING DOXYCUCLINE  Final   Gram Stain   Final    RARE WBC PRESENT, PREDOMINANTLY MONONUCLEAR NO ORGANISMS SEEN Performed at [facility]    Culture   Final    FEW METHICILLIN RESISTANT STAPHYLOCOCCUS AUREUS Note: RIFAMPIN AND GENTAMICIN SHOULD NOT BE USED AS SINGLE DRUGS FOR TREATMENT OF STAPH INFECTIONS. This organism DOES NOT demonstrate inducible Clindamycin resistance in vitro. CRITICAL RESULT CALLED TO, READ BACK BY AND VERIFIED WITH: Edwyna Ready RN  05/13/14 AT 8:30 AM BY MORAC DOXYCYCLINE SENSITIVE Performed at [facility]    Report Status 05/13/2014 FINAL  Final   Organism ID, Bacteria METHICILLIN  RESISTANT STAPHYLOCOCCUS AUREUS  Final      Susceptibility   Methicillin resistant staphylococcus aureus - MIC*    CLINDAMYCIN <=0.25 SENSITIVE Sensitive     ERYTHROMYCIN >=8 RESISTANT Resistant     GENTAMICIN <=0.5 SENSITIVE Sensitive     LEVOFLOXACIN 4 INTERMEDIATE Intermediate     OXACILLIN >=4 RESISTANT Resistant     PENICILLIN >=0.5 RESISTANT Resistant     RIFAMPIN <=0.5 SENSITIVE Sensitive     TRIMETH/SULFA <=10 SENSITIVE Sensitive     VANCOMYCIN 1 SENSITIVE Sensitive     TETRACYCLINE <=1 SENSITIVE Sensitive     * FEW METHICILLIN RESISTANT STAPHYLOCOCCUS AUREUS     Labs: Basic Metabolic Panel:  Recent Labs Lab 05/08/14 1958 05/09/14 0600 05/10/14 0440 05/11/14 0500 05/13/14 0450  NA 137 138 140 140 139  K 3.1* 3.1* 3.2* 4.0 3.8  CL 96 99 96 101 94*  CO2 30 28 28 31  37*  GLUCOSE 105* 147* 105* 159* 105*  BUN 14 12 11 17 20   CREATININE 0.88 0.93 0.97 0.80 0.89  CALCIUM 9.1 9.4 8.8 9.0 8.7   Liver Function Tests:  Recent Labs Lab 05/07/14 1459 05/08/14 1958 05/09/14 0600  AST 16 19 21   ALT 15 18 18   ALKPHOS 113 97 101  BILITOT 0.7 0.3 0.4  PROT 8.3 7.7 8.1  ALBUMIN 4.7 4.1 4.2   No results for input(s): LIPASE, AMYLASE in the last 168 hours. No results for input(s): AMMONIA in the last 168 hours. CBC:  Recent Labs Lab 05/07/14 1457 05/08/14 1958 05/09/14 0600 05/10/14 0440 05/11/14 0500 05/13/14 0450  WBC 11.8* 11.4* 12.4* 9.7 8.3 8.2  NEUTROABS 10.5* 9.1* 11.0*  --   --   --   HGB 13.9 11.8* 12.6 10.4* 8.9* 9.3*  HCT 42.7 36.2 40.6 32.6* 28.7* 29.9*  MCV 91 91.9 93.5 93.4 94.1 94.3  PLT 260 248 271 233 208 238   Cardiac Enzymes: No results for input(s): CKTOTAL, CKMB, CKMBINDEX, TROPONINI in the last 168 hours. BNP: BNP (last 3 results) No results for input(s): BNP in the last 8760 hours.  ProBNP (last 3 results) No results for input(s): PROBNP in the last 8760 hours.  CBG:  Recent Labs Lab 05/13/14 0752 05/13/14 1215  05/13/14 1725 05/13/14 2201 05/14/14 0724  GLUCAP 101* 117* 149* 105* 112*        Signed:  Vernell Leep, MD, FACP, FHM. [facility] Pager [phone]  If 7PM-7AM, please contact night-coverage [email or web address] Password [facility] 05/14/2014, 10:32 AM

## 2014-05-14 NOTE — Progress Notes (Signed)
Clinical Social Work Department CLINICAL SOCIAL WORK PSYCHIATRY SERVICE LINE ASSESSMENT 05/14/2014  Patient:  [name] Carr  Account:  [id number]  Branch Date:  05/08/2014  Clinical Social Worker:  Peri Maris, CLINICAL SOCIAL WORKER  Date/Time:  05/13/2014 02:30 PM Referred by:  Physician  Date referred:  05/13/2014 Reason for Referral  Behavioral Health Issues   Presenting Symptoms/Problems (In the person's/family's own words):   Pt referred for limited insight regarding medical decisions and depression   Abuse/Neglect/Trauma History (check all that apply)  Denies history   Abuse/Neglect/Trauma Comments:   N/A   Psychiatric History (check all that apply)  Outpatient treatment  Inpatient/hospitilization   Psychiatric medications:  Klonopin 55m bid  Cymbalta 653mbid  Neurotin 80041mhree times daily  Trazadone 150-300m28m bedtime   Current Mental Health Hospitalizations/Previous Mental Health History:   Pt reports that she has seen a psychiatrist in the past but the time frame is not clear. Pt reports no acute mental health concerns. She endorses some depressive and anxious symptoms in the context of her chronic pain and lupus diagnosis.   Current provider:   PCP   Place and Date:   currently   Current Medications:   Scheduled Meds:      . azithromycin  500 mg Oral QHS  . Chlorhexidine Gluconate Cloth  6 each Topical Q0600  . clonazePAM  1 mg Oral BID  . doxycycline  100 mg Oral Q12H  . DULoxetine  60 mg Oral BID  . enoxaparin (LOVENOX) injection  40 mg Subcutaneous Q24H  . fentaNYL  25 mcg Transdermal Q72H  . gabapentin  800 mg Oral TID  . insulin aspart  0-9 Units Subcutaneous TID WC  . mupirocin ointment  1 application Nasal BID  . pantoprazole  40 mg Oral Daily  . predniSONE  10 mg Oral Q breakfast  . rOPINIRole  1 mg Oral QHS  . traZODone  150-300 mg Oral QHS        Continuous Infusions:      PRN Meds:.acetaminophen **OR** acetaminophen, fentaNYL,  HYDROcodone-acetaminophen, meperidine (DEMEROL) injection, metoCLOPramide **OR** metoCLOPramide (REGLAN) injection, ondansetron **OR** ondansetron (ZOFRAN) IV, ondansetron **OR** ondansetron (ZOFRAN) IV, sodium chloride, tiZANidine       Previous Impatient Admission/Date/Reason:   Pt reports a one night admission "many years ago" for depression; she stated that the doctor discharged her the following day because "she wasn't depressed enough."   Emotional Health / Current Symptoms    Suicide/Self Harm  None reported   Suicide attempt in the past:   Pt denies   Other harmful behavior:   Pt denies   Psychotic/Dissociative Symptoms  None reported   Other Psychotic/Dissociative Symptoms:    Attention/Behavioral Symptoms  Within Normal Limits   Other Attention / Behavioral Symptoms:   N/A    Cognitive Impairment  Within Normal Limits   Other Cognitive Impairment:    Mood and Adjustment  Mood Congruent    Stress, Anxiety, Trauma, Any Recent Loss/Stressor  Anxiety   Anxiety (frequency):   Pt reports anxiety in the context of her chronic pain and uncertainty of medical situation. She could not identify specific symptoms related to anxiety.   Phobia (specify):   N/A   Compulsive behavior (specify):   N/A   Obsessive behavior (specify):   N/A   Other:   N/A   Substance Abuse/Use  None   SBIRT completed (please refer for detailed history):  NA  Self-reported substance use:   Pt denies misuse of prescription  pain medication, however home health agencies have reported that she has manipulated her PIC line with pain medication. Pt denies this and reports that the agencies are lying.    Pt denies any other alcohol or drug use.   Urinary Drug Screen Completed:  N Alcohol level:   Lab not completed.    Environmental/Housing/Living Arrangement  Stable housing   Who is in the home:   Pt lives alone   Emergency contact:  Rockne Menghini, friend   [employer]   Patient's Strengths and Goals (patient's own words):   Pt reports having a significant amount of support in her neighborhood. She also identified her [employer] faith as a source of motivation and support.   Clinical Social Worker's Interpretive Summary:   CSW met with Pt along with psych MD. Pt was cooperative and pleasant throughout assessment. Pt did not present with depressed or flat affect. Pt endorsed "some depression" related to chronic pain. Pt denies outpatient treatment currently and medications are prescribed by her PCP. Pt did not identify specific symptoms related to depression and anxiety. Pt is primarily focused on her medical treatment and retaining her PIC line for medications. Pt was tearful when describing her medical condition and reports being fearful of continued pain and worsening sickness. CSW offered support and validation. CSW will attempt to secure outpatient follow-up with psychiatry.   Disposition:  Outpatient referral made/needed  Peri Maris, LCSWA [phone]

## 2014-05-14 NOTE — Discharge Instructions (Signed)

## 2014-05-20 ENCOUNTER — Ambulatory Visit: Payer: Self-pay | Admitting: Internal Medicine

## 2014-05-22 ENCOUNTER — Encounter (HOSPITAL_COMMUNITY): Payer: Self-pay | Admitting: Emergency Medicine

## 2014-05-22 DIAGNOSIS — L0291 Cutaneous abscess, unspecified: Secondary | ICD-10-CM | POA: Diagnosis not present

## 2014-05-22 DIAGNOSIS — G894 Chronic pain syndrome: Secondary | ICD-10-CM | POA: Diagnosis present

## 2014-05-22 DIAGNOSIS — Z9119 Patient's noncompliance with other medical treatment and regimen: Secondary | ICD-10-CM | POA: Diagnosis present

## 2014-05-22 DIAGNOSIS — Z888 Allergy status to other drugs, medicaments and biological substances status: Secondary | ICD-10-CM

## 2014-05-22 DIAGNOSIS — Z853 Personal history of malignant neoplasm of breast: Secondary | ICD-10-CM

## 2014-05-22 DIAGNOSIS — M329 Systemic lupus erythematosus, unspecified: Secondary | ICD-10-CM | POA: Diagnosis present

## 2014-05-22 DIAGNOSIS — Z91041 Radiographic dye allergy status: Secondary | ICD-10-CM

## 2014-05-22 DIAGNOSIS — I959 Hypotension, unspecified: Secondary | ICD-10-CM | POA: Diagnosis not present

## 2014-05-22 DIAGNOSIS — Z885 Allergy status to narcotic agent status: Secondary | ICD-10-CM

## 2014-05-22 DIAGNOSIS — Z79899 Other long term (current) drug therapy: Secondary | ICD-10-CM

## 2014-05-22 DIAGNOSIS — E119 Type 2 diabetes mellitus without complications: Secondary | ICD-10-CM | POA: Diagnosis present

## 2014-05-22 DIAGNOSIS — Z9221 Personal history of antineoplastic chemotherapy: Secondary | ICD-10-CM

## 2014-05-22 DIAGNOSIS — D649 Anemia, unspecified: Secondary | ICD-10-CM | POA: Diagnosis present

## 2014-05-22 DIAGNOSIS — Z7952 Long term (current) use of systemic steroids: Secondary | ICD-10-CM

## 2014-05-22 DIAGNOSIS — Z7289 Other problems related to lifestyle: Secondary | ICD-10-CM

## 2014-05-22 DIAGNOSIS — B9562 Methicillin resistant Staphylococcus aureus infection as the cause of diseases classified elsewhere: Secondary | ICD-10-CM | POA: Diagnosis present

## 2014-05-22 DIAGNOSIS — I10 Essential (primary) hypertension: Secondary | ICD-10-CM | POA: Diagnosis present

## 2014-05-22 DIAGNOSIS — L02416 Cutaneous abscess of left lower limb: Principal | ICD-10-CM | POA: Diagnosis present

## 2014-05-22 DIAGNOSIS — F6089 Other specific personality disorders: Secondary | ICD-10-CM | POA: Diagnosis present

## 2014-05-22 DIAGNOSIS — K219 Gastro-esophageal reflux disease without esophagitis: Secondary | ICD-10-CM | POA: Diagnosis present

## 2014-05-22 DIAGNOSIS — Z886 Allergy status to analgesic agent status: Secondary | ICD-10-CM

## 2014-05-22 NOTE — ED Notes (Addendum)
Pt reports she had surgery April 1st for cellulitis of L leg. sts cellulitis is back and she has had fever/chills. sts area is bleeding.

## 2014-05-23 ENCOUNTER — Encounter (HOSPITAL_COMMUNITY)
Admission: EM | Disposition: A | Discharge status: Home Health | Payer: Self-pay | Source: Home / Self Care | Attending: Internal Medicine

## 2014-05-23 ENCOUNTER — Emergency Department (HOSPITAL_COMMUNITY): Payer: Medicare Other

## 2014-05-23 ENCOUNTER — Encounter (HOSPITAL_COMMUNITY): Payer: Self-pay | Admitting: Certified Registered Nurse Anesthetist

## 2014-05-23 ENCOUNTER — Inpatient Hospital Stay (HOSPITAL_COMMUNITY): Payer: Medicare Other | Admitting: Certified Registered Nurse Anesthetist

## 2014-05-23 ENCOUNTER — Encounter: Payer: Self-pay | Admitting: Internal Medicine

## 2014-05-23 ENCOUNTER — Inpatient Hospital Stay (HOSPITAL_COMMUNITY)
Admission: EM | Admit: 2014-05-23 | Discharge: 2014-05-29 | DRG: 603 | Disposition: A | Discharge status: Home Health | Payer: Medicare Other | Attending: Internal Medicine | Admitting: Internal Medicine

## 2014-05-23 DIAGNOSIS — L089 Local infection of the skin and subcutaneous tissue, unspecified: Secondary | ICD-10-CM | POA: Diagnosis present

## 2014-05-23 DIAGNOSIS — Z765 Malingerer [conscious simulation]: Secondary | ICD-10-CM | POA: Diagnosis not present

## 2014-05-23 DIAGNOSIS — L02419 Cutaneous abscess of limb, unspecified: Secondary | ICD-10-CM | POA: Diagnosis not present

## 2014-05-23 DIAGNOSIS — I9589 Other hypotension: Secondary | ICD-10-CM | POA: Diagnosis not present

## 2014-05-23 DIAGNOSIS — A4902 Methicillin resistant Staphylococcus aureus infection, unspecified site: Secondary | ICD-10-CM | POA: Diagnosis present

## 2014-05-23 DIAGNOSIS — Z9221 Personal history of antineoplastic chemotherapy: Secondary | ICD-10-CM | POA: Diagnosis not present

## 2014-05-23 DIAGNOSIS — Z885 Allergy status to narcotic agent status: Secondary | ICD-10-CM | POA: Diagnosis not present

## 2014-05-23 DIAGNOSIS — Z886 Allergy status to analgesic agent status: Secondary | ICD-10-CM | POA: Diagnosis not present

## 2014-05-23 DIAGNOSIS — L0291 Cutaneous abscess, unspecified: Secondary | ICD-10-CM | POA: Diagnosis present

## 2014-05-23 DIAGNOSIS — G894 Chronic pain syndrome: Secondary | ICD-10-CM | POA: Diagnosis present

## 2014-05-23 DIAGNOSIS — L02416 Cutaneous abscess of left lower limb: Secondary | ICD-10-CM | POA: Diagnosis present

## 2014-05-23 DIAGNOSIS — R400 Somnolence: Secondary | ICD-10-CM | POA: Diagnosis not present

## 2014-05-23 DIAGNOSIS — F1910 Other psychoactive substance abuse, uncomplicated: Secondary | ICD-10-CM | POA: Diagnosis not present

## 2014-05-23 DIAGNOSIS — D0511 Intraductal carcinoma in situ of right breast: Secondary | ICD-10-CM | POA: Insufficient documentation

## 2014-05-23 DIAGNOSIS — F6810 Factitious disorder, unspecified: Secondary | ICD-10-CM | POA: Diagnosis present

## 2014-05-23 DIAGNOSIS — D649 Anemia, unspecified: Secondary | ICD-10-CM | POA: Diagnosis present

## 2014-05-23 DIAGNOSIS — Z853 Personal history of malignant neoplasm of breast: Secondary | ICD-10-CM

## 2014-05-23 DIAGNOSIS — Z7289 Other problems related to lifestyle: Secondary | ICD-10-CM | POA: Diagnosis not present

## 2014-05-23 DIAGNOSIS — F6089 Other specific personality disorders: Secondary | ICD-10-CM | POA: Diagnosis present

## 2014-05-23 DIAGNOSIS — A319 Mycobacterial infection, unspecified: Secondary | ICD-10-CM

## 2014-05-23 DIAGNOSIS — E119 Type 2 diabetes mellitus without complications: Secondary | ICD-10-CM | POA: Diagnosis present

## 2014-05-23 DIAGNOSIS — R509 Fever, unspecified: Secondary | ICD-10-CM | POA: Diagnosis not present

## 2014-05-23 DIAGNOSIS — K219 Gastro-esophageal reflux disease without esophagitis: Secondary | ICD-10-CM | POA: Diagnosis present

## 2014-05-23 DIAGNOSIS — B9689 Other specified bacterial agents as the cause of diseases classified elsewhere: Secondary | ICD-10-CM | POA: Diagnosis not present

## 2014-05-23 DIAGNOSIS — M329 Systemic lupus erythematosus, unspecified: Secondary | ICD-10-CM | POA: Diagnosis present

## 2014-05-23 DIAGNOSIS — I10 Essential (primary) hypertension: Secondary | ICD-10-CM | POA: Diagnosis present

## 2014-05-23 DIAGNOSIS — Z91041 Radiographic dye allergy status: Secondary | ICD-10-CM | POA: Diagnosis not present

## 2014-05-23 DIAGNOSIS — Z79899 Other long term (current) drug therapy: Secondary | ICD-10-CM | POA: Diagnosis not present

## 2014-05-23 DIAGNOSIS — Z888 Allergy status to other drugs, medicaments and biological substances status: Secondary | ICD-10-CM | POA: Diagnosis not present

## 2014-05-23 DIAGNOSIS — Z9119 Patient's noncompliance with other medical treatment and regimen: Secondary | ICD-10-CM | POA: Diagnosis present

## 2014-05-23 DIAGNOSIS — B9562 Methicillin resistant Staphylococcus aureus infection as the cause of diseases classified elsewhere: Secondary | ICD-10-CM | POA: Diagnosis present

## 2014-05-23 DIAGNOSIS — I959 Hypotension, unspecified: Secondary | ICD-10-CM | POA: Diagnosis not present

## 2014-05-23 DIAGNOSIS — C50911 Malignant neoplasm of unspecified site of right female breast: Secondary | ICD-10-CM | POA: Insufficient documentation

## 2014-05-23 DIAGNOSIS — S81819A Laceration without foreign body, unspecified lower leg, initial encounter: Secondary | ICD-10-CM

## 2014-05-23 DIAGNOSIS — Z7952 Long term (current) use of systemic steroids: Secondary | ICD-10-CM | POA: Diagnosis not present

## 2014-05-23 HISTORY — PX: I & D EXTREMITY: SHX5045

## 2014-05-23 HISTORY — PX: APPLICATION OF WOUND VAC: SHX5189

## 2014-05-23 LAB — CBC WITH DIFFERENTIAL/PLATELET
Basophils Absolute: 0 10*3/uL (ref 0.0–0.1)
Basophils Relative: 0 % (ref 0–1)
EOS PCT: 1 % (ref 0–5)
Eosinophils Absolute: 0.1 10*3/uL (ref 0.0–0.7)
HCT: 34.3 % — ABNORMAL LOW (ref 36.0–46.0)
Hemoglobin: 10.6 g/dL — ABNORMAL LOW (ref 12.0–15.0)
LYMPHS ABS: 1.4 10*3/uL (ref 0.7–4.0)
LYMPHS PCT: 15 % (ref 12–46)
MCH: 29.3 pg (ref 26.0–34.0)
MCHC: 30.9 g/dL (ref 30.0–36.0)
MCV: 94.8 fL (ref 78.0–100.0)
MONO ABS: 0.6 10*3/uL (ref 0.1–1.0)
Monocytes Relative: 7 % (ref 3–12)
NEUTROS PCT: 77 % (ref 43–77)
Neutro Abs: 7.5 10*3/uL (ref 1.7–7.7)
PLATELETS: 242 10*3/uL (ref 150–400)
RBC: 3.62 MIL/uL — ABNORMAL LOW (ref 3.87–5.11)
RDW: 16.4 % — ABNORMAL HIGH (ref 11.5–15.5)
WBC: 9.7 10*3/uL (ref 4.0–10.5)

## 2014-05-23 LAB — CBC
HEMATOCRIT: 31 % — AB (ref 36.0–46.0)
Hemoglobin: 9.7 g/dL — ABNORMAL LOW (ref 12.0–15.0)
MCH: 29.8 pg (ref 26.0–34.0)
MCHC: 31.3 g/dL (ref 30.0–36.0)
MCV: 95.4 fL (ref 78.0–100.0)
Platelets: 204 10*3/uL (ref 150–400)
RBC: 3.25 MIL/uL — ABNORMAL LOW (ref 3.87–5.11)
RDW: 16.4 % — AB (ref 11.5–15.5)
WBC: 8 10*3/uL (ref 4.0–10.5)

## 2014-05-23 LAB — SURGICAL PCR SCREEN
MRSA, PCR: POSITIVE — AB
Staphylococcus aureus: POSITIVE — AB

## 2014-05-23 LAB — MAGNESIUM: MAGNESIUM: 1.9 mg/dL (ref 1.5–2.5)

## 2014-05-23 LAB — GLUCOSE, CAPILLARY
Glucose-Capillary: 108 mg/dL — ABNORMAL HIGH (ref 70–99)
Glucose-Capillary: 92 mg/dL (ref 70–99)
Glucose-Capillary: 93 mg/dL (ref 70–99)

## 2014-05-23 LAB — BASIC METABOLIC PANEL
Anion gap: 12 (ref 5–15)
BUN: 19 mg/dL (ref 6–23)
CHLORIDE: 100 mmol/L (ref 96–112)
CO2: 27 mmol/L (ref 19–32)
Calcium: 9.3 mg/dL (ref 8.4–10.5)
Creatinine, Ser: 1.14 mg/dL — ABNORMAL HIGH (ref 0.50–1.10)
GFR calc non Af Amer: 52 mL/min — ABNORMAL LOW (ref >90)
GFR, EST AFRICAN AMERICAN: 60 mL/min — AB (ref >90)
Glucose, Bld: 117 mg/dL — ABNORMAL HIGH (ref 70–99)
Potassium: 3.3 mmol/L — ABNORMAL LOW (ref 3.5–5.1)
Sodium: 139 mmol/L (ref 135–145)

## 2014-05-23 LAB — RAPID URINE DRUG SCREEN, HOSP PERFORMED
AMPHETAMINES: NOT DETECTED
BARBITURATES: NOT DETECTED
BENZODIAZEPINES: POSITIVE — AB
COCAINE: NOT DETECTED
Opiates: POSITIVE — AB
TETRAHYDROCANNABINOL: NOT DETECTED

## 2014-05-23 LAB — CREATININE, SERUM
CREATININE: 1.08 mg/dL (ref 0.50–1.10)
GFR calc Af Amer: 64 mL/min — ABNORMAL LOW (ref >90)
GFR calc non Af Amer: 55 mL/min — ABNORMAL LOW (ref >90)

## 2014-05-23 SURGERY — IRRIGATION AND DEBRIDEMENT EXTREMITY
Anesthesia: General | Site: Thigh | Laterality: Left

## 2014-05-23 MED ORDER — DEXTROSE 5 % IV SOLN
500.0000 mg | INTRAVENOUS | Status: DC
  Administered 2014-05-23 – 2014-05-25 (×3): 500 mg via INTRAVENOUS
  Filled 2014-05-23 (×5): qty 500

## 2014-05-23 MED ORDER — ONDANSETRON HCL 4 MG PO TABS
4.0000 mg | ORAL_TABLET | Freq: Four times a day (QID) | ORAL | Status: DC | PRN
  Administered 2014-05-26: 4 mg via ORAL
  Filled 2014-05-23 (×2): qty 1

## 2014-05-23 MED ORDER — FENTANYL CITRATE (PF) 100 MCG/2ML IJ SOLN
INTRAMUSCULAR | Status: AC
  Filled 2014-05-23: qty 2

## 2014-05-23 MED ORDER — POTASSIUM CHLORIDE CRYS ER 10 MEQ PO TBCR
10.0000 meq | EXTENDED_RELEASE_TABLET | Freq: Every day | ORAL | Status: DC
  Administered 2014-05-23 – 2014-05-24 (×2): 10 meq via ORAL
  Filled 2014-05-23 (×2): qty 1

## 2014-05-23 MED ORDER — GABAPENTIN 800 MG PO TABS
800.0000 mg | ORAL_TABLET | Freq: Three times a day (TID) | ORAL | Status: DC
  Filled 2014-05-23 (×3): qty 1

## 2014-05-23 MED ORDER — PANTOPRAZOLE SODIUM 40 MG PO TBEC: 40.0000 mg | DELAYED_RELEASE_TABLET | Freq: Every day | ORAL | Status: DC

## 2014-05-23 MED ORDER — HEPARIN SODIUM (PORCINE) 5000 UNIT/ML IJ SOLN
5000.0000 [IU] | Freq: Three times a day (TID) | INTRAMUSCULAR | Status: DC
  Administered 2014-05-23 – 2014-05-29 (×17): 5000 [IU] via SUBCUTANEOUS
  Filled 2014-05-23 (×15): qty 1

## 2014-05-23 MED ORDER — PROMETHAZINE HCL 25 MG/ML IJ SOLN
6.2500 mg | INTRAMUSCULAR | Status: AC | PRN
  Administered 2014-05-25 – 2014-05-26 (×2): 12.5 mg via INTRAVENOUS
  Filled 2014-05-23 (×2): qty 1

## 2014-05-23 MED ORDER — AZITHROMYCIN 500 MG PO TABS: 500.0000 mg | ORAL_TABLET | Freq: Every day | ORAL | Status: DC

## 2014-05-23 MED ORDER — TIZANIDINE HCL 2 MG PO TABS
2.0000 mg | ORAL_TABLET | Freq: Four times a day (QID) | ORAL | Status: DC | PRN
  Administered 2014-05-24 – 2014-05-25 (×5): 4 mg via ORAL
  Filled 2014-05-23 (×10): qty 2

## 2014-05-23 MED ORDER — HYDROMORPHONE HCL 1 MG/ML IJ SOLN
1.0000 mg | INTRAMUSCULAR | Status: DC | PRN
  Administered 2014-05-23 (×2): 1 mg via INTRAVENOUS
  Filled 2014-05-23 (×2): qty 1

## 2014-05-23 MED ORDER — ONDANSETRON HCL 4 MG/2ML IJ SOLN
4.0000 mg | Freq: Four times a day (QID) | INTRAMUSCULAR | Status: DC | PRN
  Administered 2014-05-27 – 2014-05-29 (×5): 4 mg via INTRAVENOUS
  Filled 2014-05-23 (×5): qty 2

## 2014-05-23 MED ORDER — INSULIN ASPART 100 UNIT/ML ~~LOC~~ SOLN
0.0000 [IU] | SUBCUTANEOUS | Status: DC
  Administered 2014-05-24: 2 [IU] via SUBCUTANEOUS
  Administered 2014-05-24: 1 [IU] via SUBCUTANEOUS
  Administered 2014-05-25 (×3): 2 [IU] via SUBCUTANEOUS
  Administered 2014-05-26: 1 [IU] via SUBCUTANEOUS
  Administered 2014-05-27: 2 [IU] via SUBCUTANEOUS

## 2014-05-23 MED ORDER — ONDANSETRON HCL 4 MG/2ML IJ SOLN
INTRAMUSCULAR | Status: AC
  Filled 2014-05-23: qty 6

## 2014-05-23 MED ORDER — FENTANYL CITRATE (PF) 250 MCG/5ML IJ SOLN
INTRAMUSCULAR | Status: AC
  Filled 2014-05-23: qty 5

## 2014-05-23 MED ORDER — DEXTROSE 50 % IV SOLN
INTRAVENOUS | Status: AC
  Administered 2014-05-23: 25 mL
  Filled 2014-05-23: qty 50

## 2014-05-23 MED ORDER — SENNOSIDES-DOCUSATE SODIUM 8.6-50 MG PO TABS: 1.0000 | ORAL_TABLET | Freq: Two times a day (BID) | ORAL | Status: DC | PRN

## 2014-05-23 MED ORDER — SPIRONOLACTONE 25 MG PO TABS
25.0000 mg | ORAL_TABLET | Freq: Every day | ORAL | Status: DC
  Administered 2014-05-24 – 2014-05-25 (×2): 25 mg via ORAL
  Filled 2014-05-23 (×2): qty 1

## 2014-05-23 MED ORDER — ROPINIROLE HCL 1 MG PO TABS
1.0000 mg | ORAL_TABLET | Freq: Every day | ORAL | Status: DC
  Administered 2014-05-23 – 2014-05-24 (×2): 1 mg via ORAL
  Filled 2014-05-23 (×2): qty 1

## 2014-05-23 MED ORDER — DULOXETINE HCL 60 MG PO CPEP
60.0000 mg | ORAL_CAPSULE | Freq: Two times a day (BID) | ORAL | Status: DC
  Administered 2014-05-23 – 2014-05-29 (×12): 60 mg via ORAL
  Filled 2014-05-23 (×12): qty 1

## 2014-05-23 MED ORDER — FENTANYL CITRATE (PF) 100 MCG/2ML IJ SOLN
INTRAMUSCULAR | Status: DC | PRN
  Administered 2014-05-23: 25 ug via INTRAVENOUS
  Administered 2014-05-23: 50 ug via INTRAVENOUS
  Administered 2014-05-23: 100 ug via INTRAVENOUS

## 2014-05-23 MED ORDER — PROMETHAZINE HCL 25 MG RE SUPP
25.0000 mg | Freq: Four times a day (QID) | RECTAL | Status: DC | PRN
  Administered 2014-05-26 – 2014-05-29 (×2): 25 mg via RECTAL
  Filled 2014-05-23 (×2): qty 1

## 2014-05-23 MED ORDER — FUROSEMIDE 20 MG PO TABS
20.0000 mg | ORAL_TABLET | ORAL | Status: DC
  Administered 2014-05-24 – 2014-05-25 (×2): 20 mg via ORAL
  Filled 2014-05-23 (×2): qty 1

## 2014-05-23 MED ORDER — DOXYCYCLINE HYCLATE 100 MG PO TABS: 100.0000 mg | ORAL_TABLET | Freq: Two times a day (BID) | ORAL | Status: DC

## 2014-05-23 MED ORDER — DEXTROSE 5 % IV SOLN
500.0000 mg | Freq: Once | INTRAVENOUS | Status: AC
  Administered 2014-05-23: 500 mg via INTRAVENOUS
  Filled 2014-05-23: qty 500

## 2014-05-23 MED ORDER — SODIUM CHLORIDE 0.9 % IV SOLN
INTRAVENOUS | Status: DC
  Administered 2014-05-23: 06:00:00 via INTRAVENOUS

## 2014-05-23 MED ORDER — ONDANSETRON HCL 4 MG/2ML IJ SOLN
INTRAMUSCULAR | Status: DC | PRN
  Administered 2014-05-23: 4 mg via INTRAVENOUS

## 2014-05-23 MED ORDER — FENTANYL 25 MCG/HR TD PT72
25.0000 ug | MEDICATED_PATCH | TRANSDERMAL | Status: DC
  Administered 2014-05-23: 25 ug via TRANSDERMAL
  Filled 2014-05-23: qty 1

## 2014-05-23 MED ORDER — DOCUSATE SODIUM 100 MG PO CAPS
200.0000 mg | ORAL_CAPSULE | Freq: Two times a day (BID) | ORAL | Status: DC
  Administered 2014-05-23 – 2014-05-29 (×12): 200 mg via ORAL
  Filled 2014-05-23 (×13): qty 2

## 2014-05-23 MED ORDER — PROMETHAZINE HCL 25 MG/ML IJ SOLN
12.5000 mg | Freq: Once | INTRAMUSCULAR | Status: AC
  Administered 2014-05-23: 12.5 mg via INTRAVENOUS
  Filled 2014-05-23: qty 1

## 2014-05-23 MED ORDER — PREDNISONE 5 MG PO TABS
10.0000 mg | ORAL_TABLET | Freq: Every day | ORAL | Status: DC
  Administered 2014-05-24 – 2014-05-29 (×6): 10 mg via ORAL
  Filled 2014-05-23 (×6): qty 2

## 2014-05-23 MED ORDER — ORITAVANCIN DIPHOSPHATE 400 MG IV SOLR
1200.0000 mg | Freq: Once | INTRAVENOUS | Status: AC
  Administered 2014-05-24: 1200 mg via INTRAVENOUS
  Filled 2014-05-23: qty 120

## 2014-05-23 MED ORDER — GABAPENTIN 400 MG PO CAPS
800.0000 mg | ORAL_CAPSULE | Freq: Three times a day (TID) | ORAL | Status: DC
  Administered 2014-05-24 – 2014-05-25 (×4): 800 mg via ORAL
  Filled 2014-05-23 (×5): qty 2

## 2014-05-23 MED ORDER — LACTATED RINGERS IV SOLN
INTRAVENOUS | Status: DC | PRN
  Administered 2014-05-23: 19:00:00 via INTRAVENOUS

## 2014-05-23 MED ORDER — HYDROCHLOROTHIAZIDE 25 MG PO TABS
25.0000 mg | ORAL_TABLET | Freq: Every day | ORAL | Status: DC
  Administered 2014-05-24: 25 mg via ORAL
  Filled 2014-05-23: qty 1

## 2014-05-23 MED ORDER — LORAZEPAM 2 MG/ML IJ SOLN
1.0000 mg | Freq: Four times a day (QID) | INTRAMUSCULAR | Status: DC | PRN
  Administered 2014-05-23: 1 mg via INTRAVENOUS
  Filled 2014-05-23: qty 1

## 2014-05-23 MED ORDER — HYDROMORPHONE HCL 1 MG/ML IJ SOLN
0.5000 mg | Freq: Once | INTRAMUSCULAR | Status: AC
  Administered 2014-05-23: 0.5 mg via INTRAVENOUS
  Filled 2014-05-23: qty 1

## 2014-05-23 MED ORDER — HYDROMORPHONE HCL 1 MG/ML IJ SOLN
0.5000 mg | INTRAMUSCULAR | Status: DC | PRN
  Administered 2014-05-23: 0.5 mg via INTRAVENOUS
  Filled 2014-05-23: qty 1

## 2014-05-23 MED ORDER — TRAZODONE HCL 150 MG PO TABS
150.0000 mg | ORAL_TABLET | Freq: Every evening | ORAL | Status: DC | PRN
  Filled 2014-05-23: qty 2

## 2014-05-23 MED ORDER — PANTOPRAZOLE SODIUM 20 MG PO TBEC
20.0000 mg | DELAYED_RELEASE_TABLET | Freq: Every day | ORAL | Status: DC
  Administered 2014-05-24 – 2014-05-29 (×6): 20 mg via ORAL
  Filled 2014-05-23 (×7): qty 1

## 2014-05-23 MED ORDER — ACETAMINOPHEN 325 MG PO TABS
650.0000 mg | ORAL_TABLET | Freq: Four times a day (QID) | ORAL | Status: DC | PRN
  Administered 2014-05-24 – 2014-05-26 (×3): 650 mg via ORAL
  Filled 2014-05-23 (×3): qty 2

## 2014-05-23 MED ORDER — HYDROMORPHONE HCL 1 MG/ML IJ SOLN
INTRAMUSCULAR | Status: AC
  Filled 2014-05-23: qty 1

## 2014-05-23 MED ORDER — PROPOFOL 10 MG/ML IV BOLUS
INTRAVENOUS | Status: DC | PRN
  Administered 2014-05-23: 150 mg via INTRAVENOUS

## 2014-05-23 MED ORDER — DOXYCYCLINE HYCLATE 100 MG IV SOLR
100.0000 mg | Freq: Once | INTRAVENOUS | Status: AC
  Administered 2014-05-23: 100 mg via INTRAVENOUS
  Filled 2014-05-23: qty 100

## 2014-05-23 MED ORDER — SODIUM CHLORIDE 0.9 % IR SOLN
Status: DC | PRN
  Administered 2014-05-23 (×4): 1000 mL

## 2014-05-23 MED ORDER — METOCLOPRAMIDE HCL 5 MG/ML IJ SOLN
5.0000 mg | Freq: Three times a day (TID) | INTRAMUSCULAR | Status: DC | PRN
Start: 2014-05-23 — End: 2014-05-29
  Administered 2014-05-28: 10 mg via INTRAVENOUS
  Filled 2014-05-23: qty 2

## 2014-05-23 MED ORDER — ACETAMINOPHEN 500 MG PO TABS: 500.0000 mg | ORAL_TABLET | Freq: Four times a day (QID) | ORAL | Status: DC | PRN

## 2014-05-23 MED ORDER — LIDOCAINE HCL 1 % IJ SOLN
INTRAMUSCULAR | Status: DC | PRN
  Administered 2014-05-23: 60 mg via INTRADERMAL

## 2014-05-23 MED ORDER — FENTANYL CITRATE (PF) 100 MCG/2ML IJ SOLN
25.0000 ug | INTRAMUSCULAR | Status: DC | PRN
  Administered 2014-05-23: 50 ug via INTRAVENOUS

## 2014-05-23 MED ORDER — METOCLOPRAMIDE HCL 5 MG PO TABS: 5.0000 mg | ORAL_TABLET | Freq: Three times a day (TID) | ORAL | Status: DC | PRN

## 2014-05-23 MED ORDER — ACETAMINOPHEN 650 MG RE SUPP: 650.0000 mg | Freq: Four times a day (QID) | RECTAL | Status: DC | PRN

## 2014-05-23 MED ORDER — POTASSIUM CHLORIDE 10 MEQ/100ML IV SOLN
10.0000 meq | INTRAVENOUS | Status: AC
  Administered 2014-05-23 (×4): 10 meq via INTRAVENOUS
  Filled 2014-05-23 (×2): qty 100

## 2014-05-23 MED ORDER — DOXYCYCLINE HYCLATE 100 MG IV SOLR
100.0000 mg | Freq: Two times a day (BID) | INTRAVENOUS | Status: DC
  Administered 2014-05-23 – 2014-05-25 (×6): 100 mg via INTRAVENOUS
  Filled 2014-05-23 (×9): qty 100

## 2014-05-23 MED ORDER — HYDROMORPHONE HCL 1 MG/ML IJ SOLN
1.0000 mg | INTRAMUSCULAR | Status: DC | PRN
  Administered 2014-05-23: 1 mg via INTRAVENOUS
  Administered 2014-05-23: 0.25 mg via INTRAVENOUS
  Administered 2014-05-24 – 2014-05-25 (×9): 1 mg via INTRAVENOUS
  Filled 2014-05-23 (×10): qty 1

## 2014-05-23 MED ORDER — FENTANYL CITRATE (PF) 100 MCG/2ML IJ SOLN
50.0000 ug | Freq: Once | INTRAMUSCULAR | Status: DC
  Filled 2014-05-23: qty 2

## 2014-05-23 MED ORDER — CLONAZEPAM 1 MG PO TABS
1.0000 mg | ORAL_TABLET | Freq: Two times a day (BID) | ORAL | Status: DC
  Administered 2014-05-23 – 2014-05-25 (×4): 1 mg via ORAL
  Filled 2014-05-23 (×4): qty 1

## 2014-05-23 SURGICAL SUPPLY — 59 items
BANDAGE ELASTIC 4 VELCRO ST LF (GAUZE/BANDAGES/DRESSINGS) IMPLANT
BANDAGE ELASTIC 6 VELCRO ST LF (GAUZE/BANDAGES/DRESSINGS) ×4 IMPLANT
BNDG COHESIVE 4X5 TAN STRL (GAUZE/BANDAGES/DRESSINGS) IMPLANT
BNDG GAUZE ELAST 4 BULKY (GAUZE/BANDAGES/DRESSINGS) IMPLANT
CANISTER SUCTION WELLS/JOHNSON (MISCELLANEOUS) ×2 IMPLANT
CANISTER WOUND CARE 500ML ATS (WOUND CARE) ×2 IMPLANT
COVER SURGICAL LIGHT HANDLE (MISCELLANEOUS) ×3 IMPLANT
CUFF TOURNIQUET SINGLE 18IN (TOURNIQUET CUFF) ×1 IMPLANT
CUFF TOURNIQUET SINGLE 24IN (TOURNIQUET CUFF) IMPLANT
CUFF TOURNIQUET SINGLE 34IN LL (TOURNIQUET CUFF) IMPLANT
CUFF TOURNIQUET SINGLE 44IN (TOURNIQUET CUFF) IMPLANT
DRAPE IMP U-DRAPE 54X76 (DRAPES) ×2 IMPLANT
DRAPE ORTHO SPLIT 77X108 STRL (DRAPES) ×6
DRAPE SURG ORHT 6 SPLT 77X108 (DRAPES) IMPLANT
DRAPE U-SHAPE 47X51 STRL (DRAPES) ×5 IMPLANT
DRSG PAD ABDOMINAL 8X10 ST (GAUZE/BANDAGES/DRESSINGS) IMPLANT
DRSG VAC ATS MED SENSATRAC (GAUZE/BANDAGES/DRESSINGS) ×2 IMPLANT
DURAPREP 26ML APPLICATOR (WOUND CARE) ×1 IMPLANT
ELECT CAUTERY BLADE 6.4 (BLADE) ×2 IMPLANT
ELECT REM PT RETURN 9FT ADLT (ELECTROSURGICAL)
ELECTRODE REM PT RTRN 9FT ADLT (ELECTROSURGICAL) IMPLANT
FACESHIELD WRAPAROUND (MASK) IMPLANT
FACESHIELD WRAPAROUND OR TEAM (MASK) ×1 IMPLANT
GAUZE SPONGE 4X4 12PLY STRL (GAUZE/BANDAGES/DRESSINGS) IMPLANT
GAUZE XEROFORM 5X9 LF (GAUZE/BANDAGES/DRESSINGS) IMPLANT
GLOVE BIOGEL PI IND STRL 8 (GLOVE) ×1 IMPLANT
GLOVE BIOGEL PI INDICATOR 8 (GLOVE) ×2
GLOVE SURG ORTHO 8.0 STRL STRW (GLOVE) ×3 IMPLANT
GOWN STRL REUS W/ TWL LRG LVL3 (GOWN DISPOSABLE) ×2 IMPLANT
GOWN STRL REUS W/ TWL XL LVL3 (GOWN DISPOSABLE) ×1 IMPLANT
GOWN STRL REUS W/TWL LRG LVL3 (GOWN DISPOSABLE) ×9
GOWN STRL REUS W/TWL XL LVL3 (GOWN DISPOSABLE) ×3
HANDPIECE INTERPULSE COAX TIP (DISPOSABLE)
KIT BASIN OR (CUSTOM PROCEDURE TRAY) ×3 IMPLANT
KIT ROOM TURNOVER OR (KITS) ×3 IMPLANT
MANIFOLD NEPTUNE II (INSTRUMENTS) ×1 IMPLANT
NS IRRIG 1000ML POUR BTL (IV SOLUTION) ×9 IMPLANT
PACK ORTHO EXTREMITY (CUSTOM PROCEDURE TRAY) ×3 IMPLANT
PAD ARMBOARD 7.5X6 YLW CONV (MISCELLANEOUS) ×6 IMPLANT
PAD CAST 4YDX4 CTTN HI CHSV (CAST SUPPLIES) IMPLANT
PADDING CAST COTTON 4X4 STRL (CAST SUPPLIES)
SET HNDPC FAN SPRY TIP SCT (DISPOSABLE) IMPLANT
SPONGE LAP 18X18 X RAY DECT (DISPOSABLE) ×3 IMPLANT
SPONGE LAP 4X18 X RAY DECT (DISPOSABLE) ×1 IMPLANT
STOCKINETTE IMPERVIOUS 9X36 MD (GAUZE/BANDAGES/DRESSINGS) ×3 IMPLANT
SUT ETHILON 2 0 FS 18 (SUTURE) IMPLANT
SUT ETHILON 3 0 PS 1 (SUTURE) IMPLANT
SUT ETHILON 4 0 PS 2 18 (SUTURE) IMPLANT
SUT PROLENE 3 0 PS 2 (SUTURE) IMPLANT
SUT VIC AB 3-0 SH 27 (SUTURE)
SUT VIC AB 3-0 SH 27X BRD (SUTURE) IMPLANT
SWAB COLLECTION DEVICE MRSA (MISCELLANEOUS) ×2 IMPLANT
TOWEL OR 17X24 6PK STRL BLUE (TOWEL DISPOSABLE) ×3 IMPLANT
TOWEL OR 17X26 10 PK STRL BLUE (TOWEL DISPOSABLE) ×3 IMPLANT
TUBE ANAEROBIC SPECIMEN COL (MISCELLANEOUS) ×2 IMPLANT
TUBE CONNECTING 12'X1/4 (SUCTIONS) ×1
TUBE CONNECTING 12X1/4 (SUCTIONS) ×2 IMPLANT
UNDERPAD 30X30 INCONTINENT (UNDERPADS AND DIAPERS) ×1 IMPLANT
YANKAUER SUCT BULB TIP NO VENT (SUCTIONS) ×3 IMPLANT

## 2014-05-23 NOTE — Anesthesia Procedure Notes (Signed)
Procedure Name: LMA Insertion Date/Time: 05/23/2014 7:12 PM Performed by: Julian Reil Pre-anesthesia Checklist: Patient identified, Emergency Drugs available, Suction available and Patient being monitored Patient Re-evaluated:Patient Re-evaluated prior to inductionOxygen Delivery Method: Circle system utilized Preoxygenation: Pre-oxygenation with 100% oxygen Intubation Type: IV induction Ventilation: Mask ventilation without difficulty LMA: LMA inserted LMA Size: 4.0 Tube type: Oral Number of attempts: 1 Placement Confirmation: positive ETCO2 and breath sounds checked- equal and bilateral Tube secured with: Tape Dental Injury: Teeth and Oropharynx as per pre-operative assessment

## 2014-05-23 NOTE — Progress Notes (Signed)
Pt continues to call out for pain medication, LLE is very red, swollen, warm and hard to the touch.  Drainage has increased since morning assessment. MD notified.

## 2014-05-23 NOTE — Progress Notes (Signed)
Patient arrived to 4N04 AAOx4. Complaints of 10/10 pain. Left leg swollen, red, and warm. Minimal drainage on the dressing. Vitals taken, and bed alarm placed. Will continue to monitor. Shatasha Lambing, Rande Brunt, RN

## 2014-05-23 NOTE — ED Notes (Signed)
Pt asked to speak with Dr overseeing care about pain medication.

## 2014-05-23 NOTE — Progress Notes (Signed)
CARE MANAGEMENT NOTE 05/23/2014  Patient:  [name] Carr, [name] Carr   Account Number:  [id number]  Date Initiated:  05/23/2014  Documentation initiated by:  Lorne Skeens  Subjective/Objective Assessment:   Patient was admitted with recurrent thigh abscess. Recently discharged home on PO antibiotics following a surgical debridement. Patient is active with [facility] RN/CSW     Action/Plan:   Will follow for discharge needs pending PT/OT evals and physician orders.   Anticipated DC Date:     Anticipated DC Plan:        DC Planning Services  CM consult      Choice offered to / List presented to:             Status of service:  In process, will continue to follow Medicare Important Message given?   (If response is "NO", the following Medicare IM given date fields will be blank) Date Medicare IM given:   Medicare IM given by:   Date Additional Medicare IM given:   Additional Medicare IM given by:    Discharge Disposition:    Per UR Regulation:  Reviewed for med. necessity/level of care/duration of stay  If discussed at [place] of Stay Meetings, dates discussed:    Comments:  05/23/14 [place], MSN, CM- Met with patient to verify which company she is using for home health services.  Per patient, she is active with [facility]. Mary with [facility] was notified of patient's admission and will continue to follow for discharge disposition.

## 2014-05-23 NOTE — Progress Notes (Signed)
    [facility]   I WAS CALLED TO SEE MRS Hirschi TODAY FOR WORSENING THIGH ABSCESS  NOTE IF ORTHOPEDICS TAKE HER TO THE OR THEY WILL NEED TO SEND AFB CULTURES AND BACTERIAL CULTURES  PER DR CAMPBELL'S LAST NOTE, OUR GROUP WOULD NOT TREAT HER WITH LONGTTERM V ANTIBIOTICS UNLESS SHE WERE TO BE PLACED IN A SKILLED NURSING FACILITY  [facility] REQUIRES TYPICALLY ORAL AGENTS BUT PT HAS APPARENTLY REFUSED IN PAST TO TAKE THEM

## 2014-05-23 NOTE — ED Notes (Signed)
Pt helped to the bedside commode by this RN and Markus Daft, NT

## 2014-05-23 NOTE — ED Notes (Signed)
2 Unsuccessful IV starts by this RN.

## 2014-05-23 NOTE — Anesthesia Preprocedure Evaluation (Addendum)
Anesthesia Evaluation  Patient identified by MRN, date of birth, ID band Patient awake    Reviewed: Allergy & Precautions, H&P , NPO status , Patient's Chart, lab work & pertinent test results, reviewed documented beta blocker date and time   Airway Mallampati: I  TM Distance: >3 FB Neck ROM: Full    Dental  (+) Edentulous Upper, Edentulous Lower   Pulmonary neg pulmonary ROS,  breath sounds clear to auscultation        Cardiovascular hypertension, + Peripheral Vascular Disease Rhythm:Regular Rate:Normal     Neuro/Psych  Headaches, Seizures -,  Anxiety Depression    GI/Hepatic Neg liver ROS, GERD-  Medicated and Controlled,  Endo/Other  diabetes, Type 2Hypothyroidism Morbid obesity  Renal/GU CRFRenal disease  negative genitourinary   Musculoskeletal negative musculoskeletal ROS (+) Arthritis -, on steriods ,  lupus   Abdominal   Peds negative pediatric ROS (+)  Hematology  (+) anemia , Hgb 9.7   Anesthesia Other Findings   Reproductive/Obstetrics negative OB ROS                            Anesthesia Physical  Anesthesia Plan  ASA: III  Anesthesia Plan: General   Post-op Pain Management:    Induction: Intravenous  Airway Management Planned: LMA  Additional Equipment:   Intra-op Plan:   Post-operative Plan: Extubation in OR  Informed Consent: I have reviewed the patients History and Physical, chart, labs and discussed the procedure including the risks, benefits and alternatives for the proposed anesthesia with the patient or authorized representative who has indicated his/her understanding and acceptance.   Dental advisory given  Plan Discussed with: CRNA  Anesthesia Plan Comments:         Anesthesia Quick Evaluation

## 2014-05-23 NOTE — H&P (Addendum)
Hospitalist Admission History and Physical  Patient name: [name] Carr Medical record number: [id number] Date of birth: 1955/09/27 Age: 59 y.o. Gender: female  Primary Care Provider: [name], MD  Chief Complaint: recurrent thigh abscess  History of Present Illness:This is a 59 y.o. year old female with significant past medical history of multiple medical problems including chronic pain, anxiety, depression, SLE on prednisone chronically, surgical wound infection, mycobacterium fortuitum infection, type 2 DM  presenting with recurrent thigh abscess. Pt noted to have had recurrent thigh abscess since 12/2013. Has had multiple surgical operations and failed outpt management. Noted wound cultures that grew out mycobacterium fortuitum. Last admission 3/31-4/6 with noted surgical debridement. Most recent cultures 4/1 notable for rare staph aureus/MRSA on cx. Discharged on oral azithromycin and doxy.  Mixed sensitivities w/  Resistance to erythomycin and sensitive to tetracycline among others. Please see cx sensitivies.  Pt states that she has been unable to tolerate oral abx at home. Was only able to tolerate 1-2 pills on day of discharge. States that she has nausea and emesis everytime she takes abx. Has had worsening R anterior thigh swelling and pain. + subjective chills. Pt adamantly denies any hx/o narcotic abuse/PICC line misuse. Pt and associate report " that is a lie!" Presented to ER afebrile, hemodynamicallly stable. CBC and CMET grossly WNL. K 3.3, Cr 1.14. CT femur Anterior medial fluid collection in the soft tissues. In craniocaudal dimension, most consistent with soft tissue abscess. Smaller ovoid fluid collection or distally measures 2.4 cm, may be in continuity. EDPA spoke w/ on call for [place]. Medical admission. Oral abx convert to IV.   Assessment and Plan: [name] Carr is a 59 y.o. year old female presenting with thigh abscess  Active Problems:   Thigh  abscess   1- Thigh Abscess -recurrent issue in setting of abc noncompliance -noted cultures in the past + for mycobacterium fortuitum and MRSA -IV azithromycin and doxycycline -repeat blood cultures -wound cultures pending ortho eval  -ID consult in am  -ortho consult w/ [facility]  2-HTN -BP stable  -cont home regimen  3-Chronic Pain  -cont fentanyl  -cont neurontin  -follow  4-SLE  -stable  -cont prednisone  5-Psych  -mood stable  -cont regimen  6-type 2 DM -SSI  -A1C  FEN/GI: heart healthy diet  Prophylaxis: sub q heparin  Disposition: pending further evaluation  Code Status:Full Code    Patient Active Problem List   Diagnosis Date Noted  . Thigh abscess 05/23/2014  . Drug-seeking behavior   . Infection 05/09/2014  . Abscess of left thigh 05/08/2014  . Diabetes mellitus type 2, controlled 05/08/2014  . 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 Neuropathy 08/28/2013  . Hypertension   . Cellulitis and abscess of leg 08/22/2013  . Syncope 01/11/2012  . SLE-on prednisone-Follow at [facility] 02/05/2011  . DVT L IJ 12/26/10 02/04/2011  . Normocytic anemia 12/27/2010  . Malignant neoplasm of female breast 09/07/2009  . Type 2 diabetes mellitus 09/07/2009  . HYPERLIPIDEMIA 09/07/2009  . ANEMIA-IRON DEFICIENCY 09/07/2009  . Chronic pain syndrome 09/07/2009  . MIGRAINE HEADACHE 09/07/2009  . ALLERGIC RHINITIS 09/07/2009  . GERD 09/07/2009  . SEIZURE DISORDER 09/07/2009  . PERSONALITY DISORDER 09/01/2009  . Major depression, recurrent, chronic 09/01/2009  . RENAL INSUFFICIENCY 09/01/2009   Past Medical History: Past Medical History  Diagnosis Date  . PERSONALITY DISORDER   . Chronic pain syndrome     chronic narcotics, dependancy hx - [place] for pain mgmt  . MIGRAINE  HEADACHE   . Lyme disease   . ALLERGIC RHINITIS   . ANEMIA-IRON DEFICIENCY   . HYPERLIPIDEMIA   . GERD   . DIABETES MELLITUS, TYPE II   . DEPRESSION   . SLE (systemic lupus  erythematosus)     [facility] - chronic pred  . Psoriasis   . Addison's disease   . Degenerative joint disease   . DDD (degenerative disc disease)   . Phlebitis after infusion     left ?calf; "S/P phenergan/demerol injection"  . Blood clot in vein 2011; 02/04/11    "right jugular vein;left jugular vein"  . SEIZURE DISORDER     "lots of seizures; due to lupus"  . Anxiety   . Neuropathy     hands, feet, due to diabetes & back problem  . Surgical wound infection 02/15/2011    R shoulder and mastectomy site - related to PICC  . CARCINOMA, BREAST 08/2009 dx    R breast,  mets to liver - resolution s/p chemo, s/p B mastect  . Metastases to the liver   . RA (rheumatoid arthritis)   . Hypothyroidism pt states she has never had hy pothyroidism  . Hypertension     Past Surgical History: Past Surgical History  Procedure Laterality Date  . Liver bopsy  09/2009  . Port a cath placement  09/2009; 11/19/10  . Port-a-cath removal  04/2010    "due to staph & strept"  . Port-a-cath removal  12/23/2010    Procedure: REMOVAL PORT-A-CATH;  Surgeon: Pedro Earls, MD;  Location: [facility];  Service: General;  Laterality: Left;  . Breast surgery  09/2009    right breast biopsy  . Mastectomy  11/19/10    bilateral mastectomy   . I&d extremity  ~ 2010    right hand; S/P "dog bite"  . Peripherally inserted central catheter insertion    . Spider bite    . Incision and drainage abscess Left 12/22/2013    Procedure: INCISION AND DRAINAGE ABSCESS OF LEFT THIGH;  Surgeon: Donnie Mesa, MD;  Location: [facility];  Service: General;  Laterality: Left;  . I&d extremity Left 05/09/2014    Procedure: IRRIGATION AND DEBRIDEMENT LEFT THIGH ABSCESS;  Surgeon: Meredith Pel, MD;  Location: [facility];  Service: Orthopedics;  Laterality: Left;    Social History: History   Social History  . Marital Status: Single    Spouse Name: N/A  . Number of Children: N/A  . Years of Education: N/A   Social  History Main Topics  . Smoking status: Never Smoker   . Smokeless tobacco: Never Used     Comment: Never used tobacco  . Alcohol Use: No  . Drug Use: No  . Sexual Activity: No   Other Topics Concern  . None   Social History Narrative    Family History: Family History  Problem Relation Age of Onset  . Lung cancer Mother   . Lung cancer Father   . Arthritis Other   . Diabetes Other   . Hyperlipidemia Other     Allergies: Allergies  Allergen Reactions  . Ammonia Other (See Comments)    Ended up on ventilator from ammonia tabs  . Contrast Media [Iodinated Diagnostic Agents] Other (See Comments)    Doesn't breath well.  . Iohexol Other (See Comments)    SOB   . Methotrexate Anaphylaxis  . Midazolam Hcl Anaphylaxis  . Nalbuphine Other (See Comments)    Can't breathe well  . Infliximab  Nausea And Vomiting  . Ketorolac Tromethamine Other (See Comments)    Injectable doesn't work and pill hurts stomach.  . Morphine And Related Hives  . Nsaids Nausea And Vomiting  . Celecoxib Rash    Current Facility-Administered Medications  Medication Dose Route Frequency Provider Last Rate Last Dose  . 0.9 %  sodium chloride infusion   Intravenous Continuous Deneise Lever, MD      . acetaminophen (TYLENOL) tablet 500 mg  500 mg Oral Q6H PRN Deneise Lever, MD      . clonazePAM Bobbye [place]) tablet 1 mg  1 mg Oral BID Deneise Lever, MD      . DULoxetine (CYMBALTA) DR capsule 60 mg  60 mg Oral BID Deneise Lever, MD      . fentaNYL ([place] - dosed mcg/hr) patch 25 mcg  25 mcg Transdermal Q72H Deneise Lever, MD      . fentaNYL (SUBLIMAZE) injection 50 mcg  50 mcg Intravenous Once Charlann Lange, PA-C   50 mcg at 05/23/14 8527  . furosemide (LASIX) tablet 20 mg  20 mg Oral BH-q7a Deneise Lever, MD      . gabapentin (NEURONTIN) tablet 800 mg  800 mg Oral TID Deneise Lever, MD      . heparin injection 5,000 Units  5,000 Units Subcutaneous 3 times per day Deneise Lever, MD       . hydrochlorothiazide (HYDRODIURIL) tablet 25 mg  25 mg Oral Daily Deneise Lever, MD      . pantoprazole (PROTONIX) EC tablet 20 mg  20 mg Oral Daily Deneise Lever, MD      . predniSONE (DELTASONE) tablet 10 mg  10 mg Oral Q breakfast Deneise Lever, MD      . promethazine (PHENERGAN) suppository 25 mg  25 mg Rectal Q6H PRN Deneise Lever, MD      . rOPINIRole (REQUIP) tablet 1 mg  1 mg Oral QHS Deneise Lever, MD      . spironolactone (ALDACTONE) tablet 25 mg  25 mg Oral Daily Deneise Lever, MD      . tiZANidine (ZANAFLEX) tablet 2-4 mg  2-4 mg Oral Q6H PRN Deneise Lever, MD      . traZODone (DESYREL) tablet 150-300 mg  150-300 mg Oral QHS Deneise Lever, MD       Current Outpatient Prescriptions  Medication Sig Dispense Refill  . acetaminophen (TYLENOL) 500 MG tablet Take 1 tablet (500 mg total) by mouth every 6 (six) hours as needed for mild pain or fever. Total daily tylenol dose from all medications should not exceed 4 gm. Please follow up with your pain MD.    . azithromycin (ZITHROMAX) 500 MG tablet Take 1 tablet (500 mg total) by mouth at bedtime. 30 tablet 0  . clonazePAM (KLONOPIN) 1 MG tablet Take one tablet by mouth twice daily (Patient taking differently: Take 1 mg by mouth 2 (two) times daily. ) 60 tablet 5  . docusate sodium (COLACE) 100 MG capsule Take 200 mg by mouth 2 (two) times daily.     Marland Kitchen doxycycline (VIBRA-TABS) 100 MG tablet Take 1 tablet (100 mg total) by mouth 2 (two) times daily. 60 tablet 0  . DULoxetine (CYMBALTA) 60 MG capsule Take 1 capsule (60 mg total) by mouth 2 (two) times daily. 180 capsule 1  . EPINEPHrine (EPIPEN) 0.3 mg/0.3 mL IJ SOAJ injection Inject 0.3 mg into the muscle daily as needed (allergic reaction).     Marland Kitchen  fentaNYL (DURAGESIC - DOSED MCG/HR) 25 MCG/HR patch Place 25 mcg onto the skin every 3 (three) days.    . furosemide (LASIX) 20 MG tablet Take 20 mg by mouth every morning.    . gabapentin (NEURONTIN) 800 MG tablet Take 800 mg by  mouth 3 (three) times daily.    . hydrochlorothiazide (HYDRODIURIL) 25 MG tablet Take 25 mg by mouth daily.     Marland Kitchen HYDROcodone-acetaminophen (NORCO) 10-325 MG per tablet Take 1 tablet by mouth every 4 (four) hours as needed for moderate pain or severe pain. 45 tablet 0  . pantoprazole (PROTONIX) 20 MG tablet Take 20 mg by mouth daily.    . potassium chloride SA (K-DUR,KLOR-CON) 20 MEQ tablet 20 MEQ daily    . predniSONE (DELTASONE) 5 MG tablet Take 10 mg by mouth daily with breakfast.     . PRESCRIPTION MEDICATION Take 1 tablet by mouth daily. Supportive therapy [facility]    . promethazine (PHENERGAN) 25 MG suppository Place 25 mg rectally every 6 (six) hours as needed for nausea or vomiting.    Marland Kitchen rOPINIRole (REQUIP) 1 MG tablet Take 1 mg by mouth at bedtime.    Marland Kitchen spironolactone (ALDACTONE) 25 MG tablet Take 25 mg by mouth daily.    Marland Kitchen tiZANidine (ZANAFLEX) 4 MG tablet Take 2-4 mg by mouth every 6 (six) hours as needed for muscle spasms.     . traZODone (DESYREL) 150 MG tablet Take 150-300 mg by mouth at bedtime.    . pantoprazole (PROTONIX) 40 MG tablet Take 40 mg by mouth daily.    Marland Kitchen senna-docusate (SENOKOT-S) 8.6-50 MG per tablet Take 1 tablet by mouth 2 (two) times daily as needed for mild constipation.      Review Of Systems: 12 point ROS negative except as noted above in HPI.  Physical Exam: Filed Vitals:   05/23/14 0500  BP: 113/52  Pulse: 77  Temp:   Resp:     General: alert and cooperative HEENT: PERRLA and extra ocular movement intact Heart: S1, S2 normal, no murmur, rub or gallop, regular rate and rhythm Lungs: clear to auscultation, no wheezes or rales and unlabored breathing Abdomen: abdomen is soft without significant tenderness, masses, organomegaly or guarding Extremities: R anterior thigh swelling, distal pulses intact Skin:as above  Neurology: normal without focal findings  Labs and Imaging: Lab Results  Component Value Date/Time   NA 139 05/23/2014 12:16 AM   NA 135  04/25/2013 02:39 PM   NA 135* 04/08/2013 11:48 AM   K 3.3* 05/23/2014 12:16 AM   K 3.5 04/25/2013 02:39 PM   K 4.6 04/08/2013 11:48 AM   CL 100 05/23/2014 12:16 AM   CL 90* 04/25/2013 02:39 PM   CL 91* 03/15/2012 02:10 PM   CO2 27 05/23/2014 12:16 AM   CO2 32 04/25/2013 02:39 PM   CO2 28 04/08/2013 11:48 AM   BUN 19 05/23/2014 12:16 AM   BUN 26* 04/25/2013 02:39 PM   BUN 29.9* 04/08/2013 11:48 AM   CREATININE 1.14* 05/23/2014 12:16 AM   CREATININE 0.94 09/11/2013 05:18 PM   CREATININE 1.2* 04/08/2013 11:48 AM   GLUCOSE 117* 05/23/2014 12:16 AM   GLUCOSE 201* 04/25/2013 02:39 PM   GLUCOSE 268* 04/08/2013 11:48 AM   GLUCOSE 131* 03/15/2012 02:10 PM   Lab Results  Component Value Date   WBC 9.7 05/23/2014   HGB 10.6* 05/23/2014   HCT 34.3* 05/23/2014   MCV 94.8 05/23/2014   PLT 242 05/23/2014    Ct Femur Left  Wo Contrast  05/23/2014   CLINICAL DATA:  Surgery 2 weeks prior to the left leg for cellulitis. Now with recurrent cellulitis, fever, chills and bleeding in the soft tissues.  EXAM: CT OF THE LEFT FEMUR WITHOUT CONTRAST  TECHNIQUE: Multidetector CT imaging was performed according to the standard protocol. Multiplanar CT image reconstructions were also generated. No intravenous contrast due to reported allergy.  COMPARISON:  CT lower extremity 05/07/2014  FINDINGS: Progressesive/recurrent soft tissue infection with development of confluent soft tissue fluid collection in the anterior medial left thigh. This appears elongated measuring 15 cm in cranial caudal dimension, 6.2 by 3.3 cm in transverse by AP dimension. There is associated diffuse soft tissue edema, skin thickening, and induration. This is likely in continuity with a smaller ovoid fluid collection distally measuring 3.0 x 1.2 x 2.4 cm. A few foci of air seen within the anterior thigh muscle compartment that appears similar to prior exam, however the intramuscular fluid collection is less well-defined with diffuse muscular  edema. No definite air in the fluid collection. Multifocal soft tissue calcifications are seen. No osseous erosion, periosteal reaction, or bony destructive change.  IMPRESSION: Anterior medial fluid collection in the soft tissues, 15 cm in craniocaudal dimension, most consistent with soft tissue abscess. Smaller ovoid fluid collection or distally measures 2.4 cm, may be in continuity. Hematoma could have a similar appearance, but is felt less likely. There is air within the anterior thigh muscle compartment, however no definite intramuscular fluid collection, detailed evaluation is limited given lack contrast.   Electronically Signed   By: Jeb Levering M.D.   On: 05/23/2014 04:05           Shanda Howells MD  Pager: [phone]

## 2014-05-23 NOTE — Progress Notes (Signed)
Patient arrived back to room 4N05. Patient not complaining of any pain. Will continue to monitor patient closely. Burnell Blanks, RN

## 2014-05-23 NOTE — ED Provider Notes (Signed)
CSN: [id number]     Arrival date & time 05/22/14  2319 History   First MD Initiated Contact with Patient 05/23/14 0022     Chief Complaint  Patient presents with  . Leg Pain     (Consider location/radiation/quality/duration/timing/severity/associated sxs/prior Treatment) Patient is a 59 y.o. female presenting with leg pain. The history is provided by the patient. No language interpreter was used.  Leg Pain Location:  Leg Leg location:  L upper leg Pain details:    Quality:  Sharp and shooting   Radiates to:  Does not radiate   Severity:  Moderate Associated symptoms: no fever   Associated symptoms comment:  Recent admission for surgical debridement of recurrent left thigh abscess, culture positive for MRSA, discharged on Zithromax and Doxycycline, returns to the emergency department with increased pain, area of surrounding redness that is growing larger and drainage from incision site. She reports fever. She states she has been unable to hold down her antibiotics as she vomits with each attempt. She reports trying to take the medication with food, taking her Phenergan prior to taking it, and is unable to tolerate the PO antibiotics. She states she has had a PICC line in the past and feels this is more beneficial with her oral antibiotic medication intolerances.    Past Medical History  Diagnosis Date  . PERSONALITY DISORDER   . Chronic pain syndrome     chronic narcotics, dependancy hx - [facility] for pain mgmt  . MIGRAINE HEADACHE   . Lyme disease   . ALLERGIC RHINITIS   . ANEMIA-IRON DEFICIENCY   . HYPERLIPIDEMIA   . GERD   . DIABETES MELLITUS, TYPE II   . DEPRESSION   . SLE (systemic lupus erythematosus)     [facility] - chronic pred  . Psoriasis   . Addison's disease   . Degenerative joint disease   . DDD (degenerative disc disease)   . Phlebitis after infusion     left ?calf; "S/P phenergan/demerol injection"  . Blood clot in vein 2011; 02/04/11    "right  jugular vein;left jugular vein"  . SEIZURE DISORDER     "lots of seizures; due to lupus"  . Anxiety   . Neuropathy     hands, feet, due to diabetes & back problem  . Surgical wound infection 02/15/2011    R shoulder and mastectomy site - related to PICC  . CARCINOMA, BREAST 08/2009 dx    R breast,  mets to liver - resolution s/p chemo, s/p B mastect  . Metastases to the liver   . RA (rheumatoid arthritis)   . Hypothyroidism pt states she has never had hy pothyroidism  . Hypertension    Past Surgical History  Procedure Laterality Date  . Liver bopsy  09/2009  . Port a cath placement  09/2009; 11/19/10  . Port-a-cath removal  04/2010    "due to staph & strept"  . Port-a-cath removal  12/23/2010    Procedure: REMOVAL PORT-A-CATH;  Surgeon: Pedro Earls, MD;  Location: [facility];  Service: General;  Laterality: Left;  . Breast surgery  09/2009    right breast biopsy  . Mastectomy  11/19/10    bilateral mastectomy   . I&d extremity  ~ 2010    right hand; S/P "dog bite"  . Peripherally inserted central catheter insertion    . Spider bite    . Incision and drainage abscess Left 12/22/2013    Procedure: INCISION AND DRAINAGE ABSCESS OF LEFT THIGH;  Surgeon: Donnie Mesa, MD;  Location: [facility];  Service: General;  Laterality: Left;  . I&d extremity Left 05/09/2014    Procedure: IRRIGATION AND DEBRIDEMENT LEFT THIGH ABSCESS;  Surgeon: Meredith Pel, MD;  Location: [facility];  Service: Orthopedics;  Laterality: Left;   Family History  Problem Relation Age of Onset  . Lung cancer Mother   . Lung cancer Father   . Arthritis Other   . Diabetes Other   . Hyperlipidemia Other    History  Substance Use Topics  . Smoking status: Never Smoker   . Smokeless tobacco: Never Used     Comment: Never used tobacco  . Alcohol Use: No   OB History    No data available     Review of Systems  Constitutional: Negative for fever and chills.  Respiratory: Negative.  Negative for shortness of  breath.   Cardiovascular: Negative.   Gastrointestinal: Negative.  Negative for vomiting and abdominal pain.  Musculoskeletal:       See HPI.  Skin: Positive for color change.  Neurological: Negative.  Negative for numbness.  Psychiatric/Behavioral: Negative for confusion.      Allergies  Ammonia; Contrast media; Iohexol; Methotrexate; Midazolam hcl; Nalbuphine; Infliximab; Ketorolac tromethamine; Morphine and related; Nsaids; and Celecoxib  Home Medications   Prior to Admission medications   Medication Sig Start Date End Date Taking? Authorizing Provider  acetaminophen (TYLENOL) 500 MG tablet Take 1 tablet (500 mg total) by mouth every 6 (six) hours as needed for mild pain or fever. Total daily tylenol dose from all medications should not exceed 4 gm. Please follow up with your pain MD. 05/14/14  Yes Modena Jansky, MD  azithromycin (ZITHROMAX) 500 MG tablet Take 1 tablet (500 mg total) by mouth at bedtime. 05/14/14  Yes Modena Jansky, MD  clonazePAM (KLONOPIN) 1 MG tablet Take one tablet by mouth twice daily Patient taking differently: Take 1 mg by mouth 2 (two) times daily.  08/28/13  Yes Estill Dooms, MD  docusate sodium (COLACE) 100 MG capsule Take 200 mg by mouth 2 (two) times daily.    Yes Historical Provider, MD  doxycycline (VIBRA-TABS) 100 MG tablet Take 1 tablet (100 mg total) by mouth 2 (two) times daily. 05/14/14  Yes Modena Jansky, MD  DULoxetine (CYMBALTA) 60 MG capsule Take 1 capsule (60 mg total) by mouth 2 (two) times daily. 06/11/12  Yes Rowe Clack, MD  EPINEPHrine (EPIPEN) 0.3 mg/0.3 mL IJ SOAJ injection Inject 0.3 mg into the muscle daily as needed (allergic reaction).    Yes Historical Provider, MD  fentaNYL (DURAGESIC - DOSED MCG/HR) 25 MCG/HR patch Place 25 mcg onto the skin every 3 (three) days.   Yes Historical Provider, MD  furosemide (LASIX) 20 MG tablet Take 20 mg by mouth every morning.   Yes Historical Provider, MD  gabapentin (NEURONTIN) 800 MG  tablet Take 800 mg by mouth 3 (three) times daily.   Yes Historical Provider, MD  hydrochlorothiazide (HYDRODIURIL) 25 MG tablet Take 25 mg by mouth daily.    Yes Historical Provider, MD  HYDROcodone-acetaminophen (NORCO) 10-325 MG per tablet Take 1 tablet by mouth every 4 (four) hours as needed for moderate pain or severe pain. 12/27/13  Yes Ripudeep Krystal Eaton, MD  pantoprazole (PROTONIX) 20 MG tablet Take 20 mg by mouth daily.   Yes Historical Provider, MD  potassium chloride SA (K-DUR,KLOR-CON) 20 MEQ tablet 20 MEQ daily 05/14/14  Yes Modena Jansky, MD  predniSONE (DELTASONE) 5  MG tablet Take 10 mg by mouth daily with breakfast.    Yes Historical Provider, MD  PRESCRIPTION MEDICATION Take 1 tablet by mouth daily. Supportive therapy [place]   Yes Historical Provider, MD  promethazine (PHENERGAN) 25 MG suppository Place 25 mg rectally every 6 (six) hours as needed for nausea or vomiting.   Yes Historical Provider, MD  rOPINIRole (REQUIP) 1 MG tablet Take 1 mg by mouth at bedtime.   Yes Historical Provider, MD  spironolactone (ALDACTONE) 25 MG tablet Take 25 mg by mouth daily.   Yes Historical Provider, MD  tiZANidine (ZANAFLEX) 4 MG tablet Take 2-4 mg by mouth every 6 (six) hours as needed for muscle spasms.    Yes Historical Provider, MD  traZODone (DESYREL) 150 MG tablet Take 150-300 mg by mouth at bedtime.   Yes Historical Provider, MD  pantoprazole (PROTONIX) 40 MG tablet Take 40 mg by mouth daily.    Historical Provider, MD  senna-docusate (SENOKOT-S) 8.6-50 MG per tablet Take 1 tablet by mouth 2 (two) times daily as needed for mild constipation.     Historical Provider, MD   BP 114/46 mmHg  Pulse 63  Temp(Src) 97.8 F (36.6 C) (Oral)  Resp 20  SpO2 99% Physical Exam  Constitutional: She is oriented to person, place, and time. She appears well-developed and well-nourished.  HENT:  Head: Normocephalic.  Neck: Normal range of motion. Neck supple.  Cardiovascular: Normal rate and intact distal  pulses.   Pulmonary/Chest: Effort normal.  Abdominal: Soft. Bowel sounds are normal. There is no tenderness. There is no rebound and no guarding.  Musculoskeletal: Normal range of motion.  Left thigh surgical incision with intact sutures and no gross drainage or bleeding. There is a large area of surrounding erythema, warm to the touch, that extends across anterior aspect of mid thigh to medial thigh. There is no induration.   Neurological: She is alert and oriented to person, place, and time.  Skin: Skin is warm and dry. No rash noted.  Psychiatric: She has a normal mood and affect.    ED Course  Procedures (including critical care time) Labs Review Labs Reviewed  CBC WITH DIFFERENTIAL/PLATELET - Abnormal; Notable for the following:    RBC 3.62 (*)    Hemoglobin 10.6 (*)    HCT 34.3 (*)    RDW 16.4 (*)    All other components within normal limits  BASIC METABOLIC PANEL - Abnormal; Notable for the following:    Potassium 3.3 (*)    Glucose, Bld 117 (*)    Creatinine, Ser 1.14 (*)    GFR calc non Af Amer 52 (*)    GFR calc Af Amer 60 (*)    All other components within normal limits    Imaging Review No results found.   EKG Interpretation None     Results for orders placed or performed during the hospital encounter of 05/23/14  CBC with Differential  Result Value Ref Range   WBC 9.7 4.0 - 10.5 K/uL   RBC 3.62 (L) 3.87 - 5.11 MIL/uL   Hemoglobin 10.6 (L) 12.0 - 15.0 g/dL   HCT 34.3 (L) 36.0 - 46.0 %   MCV 94.8 78.0 - 100.0 fL   MCH 29.3 26.0 - 34.0 pg   MCHC 30.9 30.0 - 36.0 g/dL   RDW 16.4 (H) 11.5 - 15.5 %   Platelets 242 150 - 400 K/uL   Neutrophils Relative % 77 43 - 77 %   Neutro Abs 7.5 1.7 - 7.7  K/uL   Lymphocytes Relative 15 12 - 46 %   Lymphs Abs 1.4 0.7 - 4.0 K/uL   Monocytes Relative 7 3 - 12 %   Monocytes Absolute 0.6 0.1 - 1.0 K/uL   Eosinophils Relative 1 0 - 5 %   Eosinophils Absolute 0.1 0.0 - 0.7 K/uL   Basophils Relative 0 0 - 1 %   Basophils  Absolute 0.0 0.0 - 0.1 K/uL  Basic metabolic panel  Result Value Ref Range   Sodium 139 135 - 145 mmol/L   Potassium 3.3 (L) 3.5 - 5.1 mmol/L   Chloride 100 96 - 112 mmol/L   CO2 27 19 - 32 mmol/L   Glucose, Bld 117 (H) 70 - 99 mg/dL   BUN 19 6 - 23 mg/dL   Creatinine, Ser 1.14 (H) 0.50 - 1.10 mg/dL   Calcium 9.3 8.4 - 10.5 mg/dL   GFR calc non Af Amer 52 (L) >90 mL/min   GFR calc Af Amer 60 (L) >90 mL/min   Anion gap 12 5 - 15   US Abdomen Complete  05/07/2014   CLINICAL DATA:  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 ; interval negative CT scans  EXAM: ULTRASOUND ABDOMEN COMPLETE  COMPARISON:  None.  FINDINGS: Gallbladder: The gallbladder is adequately distended. There are no stones. There is no wall thickening or pericholecystic fluid. There is no positive sonographic Murphy's sign.  Common bile duct: Diameter: 2 mm  Liver: The hepatic echotexture is normal. There is a 7 mm hyperechoic structure in the right lobe without increased vascularity.  IVC: Partially obscured by bowel gas.  Pancreas: Evaluation the pancreas was limited by bowel gas.  Spleen: Size and appearance within normal limits.  Right Kidney: Length: 11 mm. Echogenicity within normal limits. No mass or hydronephrosis visualized.  Left Kidney: Length: 10.7 mm. Echogenicity within normal limits. No mass or hydronephrosis visualized.  Abdominal aorta: Evaluation of the abdominal aorta was limited by bowel gas. The proximal aorta exhibits a maximum of 2.4 cm in diameter.  Other findings: No ascites is demonstrated.  IMPRESSION: 1. There is a 7 mm hyperechoic focus in the right hepatic lobe. Statistically this is most compatible with a hemangioma. No abnormalities elsewhere in the liver are demonstrated. For further evaluation of the hepatic parenchyma a hepatic protocol MRI would be useful. 2. No acute abnormality is demonstrated elsewhere within the abdomen. Bowel gas limits the study somewhat.   Electronically Signed   By:  David  Martinique   On: 05/07/2014 14:37   Ct Femur Left Wo Contrast  05/23/2014   CLINICAL DATA:  Surgery 2 weeks prior to the left leg for cellulitis. Now with recurrent cellulitis, fever, chills and bleeding in the soft tissues.  EXAM: CT OF THE LEFT FEMUR WITHOUT CONTRAST  TECHNIQUE: Multidetector CT imaging was performed according to the standard protocol. Multiplanar CT image reconstructions were also generated. No intravenous contrast due to reported allergy.  COMPARISON:  CT lower extremity 05/07/2014  FINDINGS: Progressesive/recurrent soft tissue infection with development of confluent soft tissue fluid collection in the anterior medial left thigh. This appears elongated measuring 15 cm in cranial caudal dimension, 6.2 by 3.3 cm in transverse by AP dimension. There is associated diffuse soft tissue edema, skin thickening, and induration. This is likely in continuity with a smaller ovoid fluid collection distally measuring 3.0 x 1.2 x 2.4 cm. A few foci of air seen within the anterior thigh muscle compartment that appears similar to prior exam, however the intramuscular  fluid collection is less well-defined with diffuse muscular edema. No definite air in the fluid collection. Multifocal soft tissue calcifications are seen. No osseous erosion, periosteal reaction, or bony destructive change.  IMPRESSION: Anterior medial fluid collection in the soft tissues, 15 cm in craniocaudal dimension, most consistent with soft tissue abscess. Smaller ovoid fluid collection or distally measures 2.4 cm, may be in continuity. Hematoma could have a similar appearance, but is felt less likely. There is air within the anterior thigh muscle compartment, however no definite intramuscular fluid collection, detailed evaluation is limited given lack contrast.   Electronically Signed   By: Jeb Levering M.D.   On: 05/23/2014 04:05   Ct Femur Left Wo Contrast  05/07/2014   CLINICAL DATA:  Previous incision and drainage of the  lateral LEFT thigh. Open thigh wound, left, sequela S71.102S (ICD-10-CM)  EXAM: CT OF THE LEFT FEMUR WITHOUT CONTRAST  TECHNIQUE: Multidetector CT imaging was performed according to the standard protocol. Multiplanar CT image reconstructions were also generated.  COMPARISON:  12/25/2013.  FINDINGS: Extensive subcutaneous calcification is present, likely due to a combination of heterotopic bone, dystrophic calcification and injection granulomata. Infiltration of the subcutaneous tissues is present in the anterior lateral LEFT thigh. This is the area of previously seen open wound extending deep to some of the calcifications.  There is an ellipsoid fluid collection just deep to the vastus lateralis muscular fascia. Small locules of gas are present in this location. When measured craniocaudal, this is 8.2 cm. On axial image 98, the collection measures 18 mm transverse by 5 cm AP. No subcutaneous fluid collection is identified. Gas tracks around to the rectus femoris muscle, consistent with extension of the abscess through the anterior compartment.  Phlegmon is present in the soft tissues, likely representing cellulitis although nonspecific. Edema and postoperative changes can produce this appearance as well. The visceral pelvis appears within normal limits. Mild symmetric atrophy of the anterior muscular compartment is present bilaterally. The RIGHT thigh is partially visible.  IMPRESSION: 1. Recurrent deep soft tissue abscess in the vastus lateralis and rectus femoris, just deep to the superficial muscular fascia. On this noncontrast study, the abscess is demarcated by fluid and gas within the anterior lateral musculature of the LEFT thigh. 2. No subcutaneous abscess. Infiltration of the subcutaneous tissues may represent scarring or more likely cellulitis in the setting of deep abscess. 3. Dystrophic calcifications throughout the subcutaneous tissues. Potentially, these could serve as a nidus for recurrent infection.    Electronically Signed   By: Dereck Ligas M.D.   On: 05/07/2014 15:17    MDM   Final diagnoses:  None    1. Recurrent abscess left thigh  Fentanyl ordered to address the patient's pain but the patient reports "that never works for me, its like water." The patient requests 2 mg Dilaudid for pain stating "that's what they give me for pain", and seems concerned she is not getting a PICC line in the emergency room. It was explained that an IV for immediate antibiotics was felt most appropriate. Dilaudid 0.5 mg given for pain. Patient sleeping on re-evaluation.  No fever in the emergency room. No significant or concerning lab abnormality. Records reviewed. The previous abscess/infection was significant and involved deep tissues. Repeat CT scan was felt appropriate given the changes the patient is experiencing today of color change and increased pain. CT today shows a recurrence of the abscess measuring 15 cm in length. Discussed with on-call for Dr. Marlou Sa who advises medicine to admit  given patient's complicated medical history and they will consult.      Charlann Lange, PA-C 05/23/14 [place], PA-C 05/23/14 [place], MD 05/23/14 [phone]

## 2014-05-23 NOTE — ED Notes (Signed)
Pt states that she is nauseous

## 2014-05-23 NOTE — Progress Notes (Signed)
UR complete.  Quinetta Shilling RN, MSN 

## 2014-05-23 NOTE — ED Notes (Signed)
PA in room with pt along with this RN and tech

## 2014-05-23 NOTE — ED Notes (Signed)
Walked into room to reassess pain, and pt asleep snoring

## 2014-05-23 NOTE — Transfer of Care (Signed)
Immediate Anesthesia Transfer of Care Note  Patient: [name] Carr  Procedure(s) Performed: Procedure(s): IRRIGATION AND DEBRIDEMENT EXTREMITY (Left) APPLICATION OF WOUND VAC (Left)  Patient Location: PACU  Anesthesia Type:General  Level of Consciousness: awake and alert   Airway & Oxygen Therapy: Patient Spontanous Breathing and Patient connected to nasal cannula oxygen  Post-op Assessment: Report given to RN, Post -op Vital signs reviewed and stable and Patient moving all extremities  Post vital signs: Reviewed and stable  Last Vitals:  Filed Vitals:   05/23/14 0900  BP: 121/55  Pulse: 84  Temp: 36.9 C  Resp: 21    Complications: No apparent anesthesia complications

## 2014-05-23 NOTE — Progress Notes (Signed)
Patient presents with drainage from left leg lateral IND performed 2 weeks ago. Sutures are still in. Patient has new cellulitis on the medial aspect of her leg. Repeat imaging demonstrates fluid collection superior to the fascial closure. The cellulitis on medial aspect of the left leg is new. Patient had extensive granulomatous calcifications within the soft tissues which were excised around the anterolateral thigh. Plan at this time is for repeat debridement. Body beads were placed at the time of index surgery. May use wound VAC postop to diminish fluid collection. We'll plan to also use antibiotic beads. Risk and benefits of repeat surgical intervention discussed all questions answered

## 2014-05-23 NOTE — Anesthesia Postprocedure Evaluation (Signed)
  Anesthesia Post-op Note  Patient: [name] Carr  Procedure(s) Performed: Procedure(s): IRRIGATION AND DEBRIDEMENT EXTREMITY (Left) APPLICATION OF WOUND VAC (Left)  Patient Location: PACU  Anesthesia Type:General  Level of Consciousness: awake and alert   Airway and Oxygen Therapy: Patient Spontanous Breathing  Post-op Pain: mild  Post-op Assessment: Post-op Vital signs reviewed  Post-op Vital Signs: Reviewed  Last Vitals:  Filed Vitals:   05/23/14 2040  BP:   Pulse: 86  Temp:   Resp: 16    Complications: No apparent anesthesia complications

## 2014-05-23 NOTE — Progress Notes (Signed)
Patient admitted after midnight. Current and previous admission's chart reviewed. Patient interviewed and examined.  C/o pain requesting "2 mg dilaudid every 2 hours".  Appears comfortable, reading newspaper inserts. Ortho to take patient to OR. Called consult to ID.    Doree Barthel, MD [facility] [email or web address] password [email or web address] Pager [phone]

## 2014-05-23 NOTE — Consult Note (Addendum)
[facility]        Date of Admission:  05/23/2014  Date of Consult:  05/23/2014  Reason for Consult: worsening thigh abscess Referring Physician: Dr. Conley Canal   HPI: [name] Carr is an 59 y.o. female with whom I am familiar from many years ago when she had porta-cath infection with CNS bacteremia. At that time she carried a diagnosis of metastatic breast cancer. Back in 2012 we had tried to treat her with IV antibiotics but as my notes documented at that time infusion company co that pt had been manipulating her central line and that it was now not functioning properly. They also informed me that the patient had a history of injecting crushed dilaudid via her portacath including an episode where she had been found unresponsive at home. She has history of recurrent line infections that per home infusion co are believed due to her manipulating the lines inappropriately. I had the picc line removed by IR and called in oral antibiotics for her. She never took these as "all oral antibiotics make me vomit."   I have documented these prior issues in my notes from January of 2013.  She had also at the time been found to be picking at her shoulder in suspicious manner.   We had extensive discussions re these prior allegatiions years ago which she adamantly denied.  More recently she has succumbed to a Thigh abscess on the left side from which M fortuitum and more recently MRSA had grown.   My partners, Dr. Linus Salmons, Baxter Flattery and most recently Dr Megan Salon have all seen her and she continues to insist that she cannot take oral antibiotics but for only a few days.  She had recent DI and D which showed MRSA (though no AFB culture was done) and was sent home on oral doxy and oral liquid azithromycin which she claims she could not tolerate for more than a few days.  She has been readmitted for worsening of her thigh abscess and placed on IV doxy and azithromycin again  (both  agents were active vs her M fortuitum and doxy vs the MRSA)     Past Medical History  Diagnosis Date  . PERSONALITY DISORDER   . Chronic pain syndrome     chronic narcotics, dependancy hx - [facility] for pain mgmt  . MIGRAINE HEADACHE   . Lyme disease   . ALLERGIC RHINITIS   . ANEMIA-IRON DEFICIENCY   . HYPERLIPIDEMIA   . GERD   . DIABETES MELLITUS, TYPE II   . DEPRESSION   . SLE (systemic lupus erythematosus)     [facility] - chronic pred  . Psoriasis   . Addison's disease   . Degenerative joint disease   . DDD (degenerative disc disease)   . Phlebitis after infusion     left ?calf; "S/P phenergan/demerol injection"  . Blood clot in vein 2011; 02/04/11    "right jugular vein;left jugular vein"  . SEIZURE DISORDER     "lots of seizures; due to lupus"  . Anxiety   . Neuropathy     hands, feet, due to diabetes & back problem  . Surgical wound infection 02/15/2011    R shoulder and mastectomy site - related to PICC  . CARCINOMA, BREAST 08/2009 dx    R breast,  mets to liver - resolution s/p chemo, s/p B mastect  . Metastases to the liver   . RA (rheumatoid arthritis)   . Hypothyroidism pt  states she has never had hy pothyroidism  . Hypertension     Past Surgical History  Procedure Laterality Date  . Liver bopsy  09/2009  . Port a cath placement  09/2009; 11/19/10  . Port-a-cath removal  04/2010    "due to staph & strept"  . Port-a-cath removal  12/23/2010    Procedure: REMOVAL PORT-A-CATH;  Surgeon: Pedro Earls, MD;  Location: [facility];  Service: General;  Laterality: Left;  . Breast surgery  09/2009    right breast biopsy  . Mastectomy  11/19/10    bilateral mastectomy   . I&d extremity  ~ 2010    right hand; S/P "dog bite"  . Peripherally inserted central catheter insertion    . Spider bite    . Incision and drainage abscess Left 12/22/2013    Procedure: INCISION AND DRAINAGE ABSCESS OF LEFT THIGH;  Surgeon: Donnie Mesa, MD;  Location: [facility];  Service:  General;  Laterality: Left;  . I&d extremity Left 05/09/2014    Procedure: IRRIGATION AND DEBRIDEMENT LEFT THIGH ABSCESS;  Surgeon: Meredith Pel, MD;  Location: [facility];  Service: Orthopedics;  Laterality: Left;  ergies:   Allergies  Allergen Reactions  . Ammonia Other (See Comments)    Ended up on ventilator from ammonia tabs  . Contrast Media [Iodinated Diagnostic Agents] Other (See Comments)    Doesn't breath well.  . Iohexol Other (See Comments)    SOB   . Methotrexate Anaphylaxis  . Midazolam Hcl Anaphylaxis  . Nalbuphine Other (See Comments)    Can't breathe well  . Infliximab Nausea And Vomiting  . Ketorolac Tromethamine Other (See Comments)    Injectable doesn't work and pill hurts stomach.  . Morphine And Related Hives  . Nsaids Nausea And Vomiting  . Celecoxib Rash     Medications: I have reviewed patients current medications as documented in Epic Anti-infectives    Start     Dose/Rate Route Frequency Ordered Stop   05/23/14 0600  azithromycin (ZITHROMAX) 500 mg in dextrose 5 % 250 mL IVPB     500 mg 250 mL/hr over 60 Minutes Intravenous Every 24 hours 05/23/14 0555     05/23/14 0600  doxycycline (VIBRAMYCIN) 100 mg in dextrose 5 % 250 mL IVPB     100 mg 125 mL/hr over 120 Minutes Intravenous Every 12 hours 05/23/14 0555     05/23/14 0100  azithromycin (ZITHROMAX) 500 mg in dextrose 5 % 250 mL IVPB     500 mg 250 mL/hr over 60 Minutes Intravenous  Once 05/23/14 0051 05/23/14 0312   05/23/14 0100  doxycycline (VIBRAMYCIN) 100 mg in dextrose 5 % 250 mL IVPB     100 mg 125 mL/hr over 120 Minutes Intravenous  Once 05/23/14 0051 05/23/14 0520      Social History:  reports that she has never smoked. She has never used smokeless tobacco. She reports that she does not drink alcohol or use illicit drugs.  Family History  Problem Relation Age of Onset  . Lung cancer Mother   . Lung cancer Father   . Arthritis Other   . Diabetes Other   . Hyperlipidemia Other       As in HPI and primary teams notes otherwise 12 point review of systems is negative  Blood pressure 159/93, pulse 88, temperature 97.5 F (36.4 C), temperature source Oral, resp. rate 18, height 5' 4"  (1.626 m), weight 188 lb 15 oz (85.7 kg), SpO2 96 %. General: Alert and awake,  oriented x3, recognized me before I did her HEENT: anicteric sclera, pupils reactive to light and accommodation, EOMI, oropharynx clear and without exudate CVS regular rate, normal r,  no murmur rubs or gallops Chest: r, no wheezing, rales or rhonchi Extremities: thigh swollen with sutures in place Neuro: nonfocal, strength and sensation intact   Results for orders placed or performed during the hospital encounter of 05/23/14 (from the past 48 hour(s))  CBC with Differential     Status: Abnormal   Collection Time: 05/23/14 12:16 AM  Result Value Ref Range   WBC 9.7 4.0 - 10.5 K/uL   RBC 3.62 (L) 3.87 - 5.11 MIL/uL   Hemoglobin 10.6 (L) 12.0 - 15.0 g/dL   HCT 34.3 (L) 36.0 - 46.0 %   MCV 94.8 78.0 - 100.0 fL   MCH 29.3 26.0 - 34.0 pg   MCHC 30.9 30.0 - 36.0 g/dL   RDW 16.4 (H) 11.5 - 15.5 %   Platelets 242 150 - 400 K/uL   Neutrophils Relative % 77 43 - 77 %   Neutro Abs 7.5 1.7 - 7.7 K/uL   Lymphocytes Relative 15 12 - 46 %   Lymphs Abs 1.4 0.7 - 4.0 K/uL   Monocytes Relative 7 3 - 12 %   Monocytes Absolute 0.6 0.1 - 1.0 K/uL   Eosinophils Relative 1 0 - 5 %   Eosinophils Absolute 0.1 0.0 - 0.7 K/uL   Basophils Relative 0 0 - 1 %   Basophils Absolute 0.0 0.0 - 0.1 K/uL  Basic metabolic panel     Status: Abnormal   Collection Time: 05/23/14 12:16 AM  Result Value Ref Range   Sodium 139 135 - 145 mmol/L   Potassium 3.3 (L) 3.5 - 5.1 mmol/L   Chloride 100 96 - 112 mmol/L   CO2 27 19 - 32 mmol/L   Glucose, Bld 117 (H) 70 - 99 mg/dL   BUN 19 6 - 23 mg/dL   Creatinine, Ser 1.14 (H) 0.50 - 1.10 mg/dL   Calcium 9.3 8.4 - 10.5 mg/dL   GFR calc non Af Amer 52 (L) >90 mL/min   GFR calc Af Amer 60 (L)  >90 mL/min    Comment: (NOTE) The eGFR has been calculated using the CKD EPI equation. This calculation has not been validated in all clinical situations. eGFR's persistently <90 mL/min signify possible Chronic Kidney Disease.    Anion gap 12 5 - 15  CBC     Status: Abnormal   Collection Time: 05/23/14  6:38 AM  Result Value Ref Range   WBC 8.0 4.0 - 10.5 K/uL   RBC 3.25 (L) 3.87 - 5.11 MIL/uL   Hemoglobin 9.7 (L) 12.0 - 15.0 g/dL   HCT 31.0 (L) 36.0 - 46.0 %   MCV 95.4 78.0 - 100.0 fL   MCH 29.8 26.0 - 34.0 pg   MCHC 31.3 30.0 - 36.0 g/dL   RDW 16.4 (H) 11.5 - 15.5 %   Platelets 204 150 - 400 K/uL  Creatinine, serum     Status: Abnormal   Collection Time: 05/23/14  6:38 AM  Result Value Ref Range   Creatinine, Ser 1.08 0.50 - 1.10 mg/dL   GFR calc non Af Amer 55 (L) >90 mL/min   GFR calc Af Amer 64 (L) >90 mL/min    Comment: (NOTE) The eGFR has been calculated using the CKD EPI equation. This calculation has not been validated in all clinical situations. eGFR's persistently <90 mL/min signify possible Chronic Kidney  Disease.   Magnesium     Status: None   Collection Time: 05/23/14  6:39 AM  Result Value Ref Range   Magnesium 1.9 1.5 - 2.5 mg/dL  Glucose, capillary     Status: Abnormal   Collection Time: 05/23/14  7:07 AM  Result Value Ref Range   Glucose-Capillary 108 (H) 70 - 99 mg/dL  Urine rapid drug screen (hosp performed)     Status: Abnormal   Collection Time: 05/23/14  9:47 AM  Result Value Ref Range   Opiates POSITIVE (A) NONE DETECTED   Cocaine NONE DETECTED NONE DETECTED   Benzodiazepines POSITIVE (A) NONE DETECTED   Amphetamines NONE DETECTED NONE DETECTED   Tetrahydrocannabinol NONE DETECTED NONE DETECTED   Barbiturates NONE DETECTED NONE DETECTED    Comment:        DRUG SCREEN FOR MEDICAL PURPOSES ONLY.  IF CONFIRMATION IS NEEDED FOR ANY PURPOSE, NOTIFY LAB WITHIN 5 DAYS.        LOWEST DETECTABLE LIMITS FOR URINE DRUG SCREEN Drug Class        Cutoff (ng/mL) Amphetamine      1000 Barbiturate      200 Benzodiazepine   035 Tricyclics       465 Opiates          300 Cocaine          300 THC              50   Surgical pcr screen     Status: Abnormal   Collection Time: 05/23/14  3:33 PM  Result Value Ref Range   MRSA, PCR POSITIVE (A) NEGATIVE   Staphylococcus aureus POSITIVE (A) NEGATIVE    Comment:        The Xpert SA Assay (FDA approved for NASAL specimens in patients over 78 years of age), is one component of a comprehensive surveillance program.  Test performance has been validated by [facility] for patients greater than or equal to 63 year old. It is not intended to diagnose infection nor to guide or monitor treatment.   Glucose, capillary     Status: None   Collection Time: 05/23/14  4:39 PM  Result Value Ref Range   Glucose-Capillary 92 70 - 99 mg/dL   @BRIEFLABTABLE (sdes,specrequest,cult,reptstatus)   ) Recent Results (from the past 720 hour(s))  Surgical pcr screen     Status: Abnormal   Collection Time: 05/08/14 11:29 PM  Result Value Ref Range Status   MRSA, PCR POSITIVE (A) NEGATIVE Final    Comment: RESULT CALLED TO, READ BACK BY AND VERIFIED WITH: MERRITT,A/5E @0224  ON 05/09/14 BY KARCZEWSKI,S.    Staphylococcus aureus POSITIVE (A) NEGATIVE Final    Comment:        The Xpert SA Assay (FDA approved for NASAL specimens in patients over 61 years of age), is one component of a comprehensive surveillance program.  Test performance has been validated by [facility] for patients greater than or equal to 72 year old. It is not intended to diagnose infection nor to guide or monitor treatment.   Anaerobic culture     Status: None   Collection Time: 05/09/14  4:31 PM  Result Value Ref Range Status   Specimen Description ABSCESS LEFT THIGH  Final   Special Requests PATIENT ON FOLLOWING DOXYCYCLINE  Final   Gram Stain   Final    NO WBC SEEN NO SQUAMOUS EPITHELIAL CELLS SEEN NO ORGANISMS  SEEN Performed at [facility]    Culture   Final  NO ANAEROBES ISOLATED Performed at [facility]    Report Status 05/14/2014 FINAL  Final  Gram stain     Status: None   Collection Time: 05/09/14  4:31 PM  Result Value Ref Range Status   Specimen Description ABSCESS LEFT THIGH  Final   Special Requests NONE  Final   Gram Stain   Final    FEW WBC PRESENT, PREDOMINANTLY PMN NO ORGANISMS SEEN Gram Stain Report Called to,Read Back By and Verified With: LITTLE,L. RN @1755  ON 4.1.16 BY MCCOY,N.    Report Status 05/09/2014 FINAL  Final  Culture, routine-abscess     Status: None   Collection Time: 05/09/14  4:31 PM  Result Value Ref Range Status   Specimen Description ABSCESS LEFT THIGH  Final   Special Requests PATIENT ON FOLLOWING DOXYCYCLINE  Final   Gram Stain   Final    NO WBC SEEN NO SQUAMOUS EPITHELIAL CELLS SEEN NO ORGANISMS SEEN Performed at [facility]    Culture   Final    RARE STAPHYLOCOCCUS AUREUS Note: RIFAMPIN AND GENTAMICIN SHOULD NOT BE USED AS SINGLE DRUGS FOR TREATMENT OF STAPH INFECTIONS. Performed at [facility]    Report Status 05/12/2014 FINAL  Final  Tissue culture     Status: None   Collection Time: 05/09/14  4:31 PM  Result Value Ref Range Status   Specimen Description ABSCESS LEFT THIGH  Final   Special Requests PATIENT ON FOLLOWING DOXYCUCLINE  Final   Gram Stain   Final    RARE WBC PRESENT, PREDOMINANTLY MONONUCLEAR NO ORGANISMS SEEN Performed at [facility]    Culture   Final    FEW METHICILLIN RESISTANT STAPHYLOCOCCUS AUREUS Note: RIFAMPIN AND GENTAMICIN SHOULD NOT BE USED AS SINGLE DRUGS FOR TREATMENT OF STAPH INFECTIONS. This organism DOES NOT demonstrate inducible Clindamycin resistance in vitro. CRITICAL RESULT CALLED TO, READ BACK BY AND VERIFIED WITH: Edwyna Ready RN  05/13/14 AT 8:30 AM BY MORAC DOXYCYCLINE SENSITIVE Performed at [facility]    Report Status 05/13/2014 FINAL  Final    Organism ID, Bacteria METHICILLIN RESISTANT STAPHYLOCOCCUS AUREUS  Final      Susceptibility   Methicillin resistant staphylococcus aureus - MIC*    CLINDAMYCIN <=0.25 SENSITIVE Sensitive     ERYTHROMYCIN >=8 RESISTANT Resistant     GENTAMICIN <=0.5 SENSITIVE Sensitive     LEVOFLOXACIN 4 INTERMEDIATE Intermediate     OXACILLIN >=4 RESISTANT Resistant     PENICILLIN >=0.5 RESISTANT Resistant     RIFAMPIN <=0.5 SENSITIVE Sensitive     TRIMETH/SULFA <=10 SENSITIVE Sensitive     VANCOMYCIN 1 SENSITIVE Sensitive     TETRACYCLINE <=1 SENSITIVE Sensitive     * FEW METHICILLIN RESISTANT STAPHYLOCOCCUS AUREUS  Surgical pcr screen     Status: Abnormal   Collection Time: 05/23/14  3:33 PM  Result Value Ref Range Status   MRSA, PCR POSITIVE (A) NEGATIVE Final   Staphylococcus aureus POSITIVE (A) NEGATIVE Final    Comment:        The Xpert SA Assay (FDA approved for NASAL specimens in patients over 47 years of age), is one component of a comprehensive surveillance program.  Test performance has been validated by [facility] for patients greater than or equal to 87 year old. It is not intended to diagnose infection nor to guide or monitor treatment.      Impression/Recommendation  Active Problems:   Thigh abscess   EAVAN GONTERMAN is a 59 y.o. female with  Hx of personality disorder, metastatic breast can, SLE, prior history of manipulating central lines, either secondary gain +/- Munchausen's who has had thigh abscess with M fortuitum and more recently MRSA    #1 Thigh Abscess:  Greatly appreciate Dr. Marlou Sa performing I and D and sending deep cultures  Fine to continue IV doxy and IV azithro  AS DR. Megan Salon EMPHATICALLY STATED IN PRIOR NOTES WE WILL NOT SUPERVISE THIS PATIENT BEING ON IV ANTIBIOTICS, CERTAINLY NOT AT HOME  I AM EVEN HIGHLY WARY OF MONITORING SUCH THERAPY EVEN IN SNF GIVEN HER PAST BEHAVIOR (ALL OF WHICH SHE HERSELF DENIES)  I DID SUGGEST PLACEMENT OF PEG  TUBE TO GIVE HER ORAL ANTIBIOTICS BUT SHE HAS REFUSED THIS  I WOULD CONSIDER HAVING HER WITH PIV GETTING THERAPY AT SNF, AND WOULD CONSIDER BUT AGAIN NOT AT ALL ENTHUSIASTIC ABOUT EVEN SUPERVISING HER WITH PICC AT SNF,   IF WE WERE TO PURSUE SUCH A ROUTE SHE WOULD NEED TO BE IN SNF FOR 6 MONTHS   THIS IS ALL DUE TO HER SAYING SHE CANNOT TAKE ORAL ANTIBIOTICS WHICH IS CLEARLY NOT BELIEVABLE  WHAT SHE DOES NEED TO DO IS TAKE ORAL ANTIBIOTICS IN FORM OF DOXY + MACROLIDE VS AVELOX PLUS MACROLIDE FOR 6 MONTHS  WE CAN CERTAINLY GIVE HER ORITOVANCIN LONG ACTIN FOR MRSA COVERAGE AS WELL  I [place], SECONDARY GAIN, MIXED PERSONALITY DISORDER  ? METASTATIC BREAST CANCER: SHE HAD AGGRESSIVE DCIS SP MASTECTOMY BUT NO EVIDENCE FOR METS HERE AT [facility]  I WOULD CONSIDER PSYCHIATRY CONSULT   05/23/2014, 8:27 PM   Thank you so much for this interesting consult  [facility] [phone] (pager) [phone] (office) 05/23/2014, 8:27 PM  Rhina [place] 05/23/2014, 8:27 PM

## 2014-05-23 NOTE — Brief Op Note (Signed)
05/23/2014  8:10 PM  PATIENT:  [name] Carr  59 y.o. female  PRE-OPERATIVE DIAGNOSIS:  left leg infection  POST-OPERATIVE DIAGNOSIS:  left leg infection  PROCEDURE:  Procedure(s): IRRIGATION AND DEBRIDEMENT EXTREMITY APPLICATION OF WOUND VAC  SURGEON:  Surgeon(s): Meredith Pel, MD  ASSISTANT: none  ANESTHESIA:   general  EBL: 50 ml    Total I/O In: 300 [I.V.:300] Out: 50 [Blood:50]  BLOOD ADMINISTERED: none  DRAINS: none and wound vac   LOCAL MEDICATIONS USED:  none  SPECIMEN:  cxs  X 2 tissue also for cx  COUNTS:  YES  TOURNIQUET:  * No tourniquets in log *  DICTATION: .Other Dictation: Dictation Number [id number]  PLAN OF CARE: Admit to inpatient   PATIENT DISPOSITION:  PACU - hemodynamically stable

## 2014-05-24 DIAGNOSIS — M329 Systemic lupus erythematosus, unspecified: Secondary | ICD-10-CM

## 2014-05-24 DIAGNOSIS — F1910 Other psychoactive substance abuse, uncomplicated: Secondary | ICD-10-CM

## 2014-05-24 DIAGNOSIS — L02419 Cutaneous abscess of limb, unspecified: Secondary | ICD-10-CM

## 2014-05-24 DIAGNOSIS — D649 Anemia, unspecified: Secondary | ICD-10-CM | POA: Diagnosis present

## 2014-05-24 LAB — CBC WITH DIFFERENTIAL/PLATELET
BASOS ABS: 0 10*3/uL (ref 0.0–0.1)
Basophils Relative: 0 % (ref 0–1)
Eosinophils Absolute: 0.2 10*3/uL (ref 0.0–0.7)
Eosinophils Relative: 2 % (ref 0–5)
HCT: 28.8 % — ABNORMAL LOW (ref 36.0–46.0)
Hemoglobin: 8.9 g/dL — ABNORMAL LOW (ref 12.0–15.0)
Lymphocytes Relative: 19 % (ref 12–46)
Lymphs Abs: 1.4 10*3/uL (ref 0.7–4.0)
MCH: 29.6 pg (ref 26.0–34.0)
MCHC: 30.9 g/dL (ref 30.0–36.0)
MCV: 95.7 fL (ref 78.0–100.0)
MONOS PCT: 10 % (ref 3–12)
Monocytes Absolute: 0.7 10*3/uL (ref 0.1–1.0)
NEUTROS ABS: 5.1 10*3/uL (ref 1.7–7.7)
Neutrophils Relative %: 69 % (ref 43–77)
Platelets: 207 10*3/uL (ref 150–400)
RBC: 3.01 MIL/uL — ABNORMAL LOW (ref 3.87–5.11)
RDW: 16.2 % — ABNORMAL HIGH (ref 11.5–15.5)
WBC: 7.4 10*3/uL (ref 4.0–10.5)

## 2014-05-24 LAB — GLUCOSE, CAPILLARY
GLUCOSE-CAPILLARY: 100 mg/dL — AB (ref 70–99)
GLUCOSE-CAPILLARY: 201 mg/dL — AB (ref 70–99)
GLUCOSE-CAPILLARY: 107 mg/dL — AB (ref 70–99)
Glucose-Capillary: 123 mg/dL — ABNORMAL HIGH (ref 70–99)
Glucose-Capillary: 94 mg/dL (ref 70–99)
Glucose-Capillary: 94 mg/dL (ref 70–99)

## 2014-05-24 LAB — COMPREHENSIVE METABOLIC PANEL
ALBUMIN: 2.8 g/dL — AB (ref 3.5–5.2)
ALT: 12 U/L (ref 0–35)
AST: 16 U/L (ref 0–37)
Alkaline Phosphatase: 76 U/L (ref 39–117)
Anion gap: 14 (ref 5–15)
BILIRUBIN TOTAL: 1 mg/dL (ref 0.3–1.2)
BUN: 10 mg/dL (ref 6–23)
CO2: 26 mmol/L (ref 19–32)
Calcium: 8.3 mg/dL — ABNORMAL LOW (ref 8.4–10.5)
Chloride: 96 mmol/L (ref 96–112)
Creatinine, Ser: 0.93 mg/dL (ref 0.50–1.10)
GFR calc non Af Amer: 66 mL/min — ABNORMAL LOW (ref >90)
GFR, EST AFRICAN AMERICAN: 76 mL/min — AB (ref >90)
GLUCOSE: 98 mg/dL (ref 70–99)
POTASSIUM: 4.3 mmol/L (ref 3.5–5.1)
Sodium: 136 mmol/L (ref 135–145)
Total Protein: 5.3 g/dL — ABNORMAL LOW (ref 6.0–8.3)

## 2014-05-24 LAB — HEMOGLOBIN A1C
Hgb A1c MFr Bld: 6.5 % — ABNORMAL HIGH (ref 4.8–5.6)
Mean Plasma Glucose: 140 mg/dL

## 2014-05-24 MED ORDER — TRAZODONE HCL 50 MG PO TABS: 150.0000 mg | ORAL_TABLET | Freq: Every evening | ORAL | Status: DC | PRN

## 2014-05-24 NOTE — Progress Notes (Signed)
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 Return with worsening pain and CAT scan showing abscess. Orthopedics and infectious disease consulted. Status post I and D by Dr. Marlou Sa on 05/23/2014.  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  Assessment/Plan:  Principal Problem:   Thigh abscess:  Status post irrigation and debridement, application of wound VAC by Dr. Marlou Sa on 4/15. PT recommending skilled nursing facility. I agree. Have consult to social work.  Patient received Oritavancin this morning which will cover MRSA for 2-4 weeks. Cultures pending. Currently getting IV azithromycin and doxycycline. Patient reports that she can only tolerate "4 days of any oral antibiotic" before vomiting starts. Agree with the plan to transition to oral while in-house. PICC line in this woman is not a good idea.  Will defer any changes to infectious disease. AFB and bacterial cultures pending Active Problems:   SLE-on prednisone-Follow at [place] behavior:  Continue Dilaudid 1 mg every 2 hours as needed.   Anemia:  Will monitor  Code Status:  full Family Communication:  Patient is alert and oriented Disposition Plan:  Eventually to skilled nursing facility  Consultants:  Orthopedics, Dr. Marlou Sa  Infectious disease  Procedures:   Left thigh I and D, wound VAC placement  Antibiotics: oritavancin 4/16 Doxy, azthro 4/15 -   HPI/Subjective: "Can I have Dilaudid 2 mg IV every 2 hours?"  Complaining of chronic back pain and leg pain eating well. No other complaints.  Objective: Filed Vitals:   05/24/14 1304  BP: 110/74  Pulse: 70  Temp: 98 F (36.7 C)  Resp: 16     Intake/Output Summary (Last 24 hours) at 05/24/14 1453 Last data filed at 05/23/14 2004  Gross per 24 hour  Intake    300 ml  Output     50 ml  Net    250 ml   Filed Weights   05/23/14 0827  Weight: 85.7 kg (188 lb 15 oz)    Exam:   General:  Eating lunch. He appears relatively comfortable. Cooperative.  Cardiovascular: Regular rate rhythm without murmurs gallops rubs  Respiratory:  Clear to auscultation bilaterally without wheezes rhonchi or rales   Abdomen: Soft nontender nondistended  Ext: Left thigh with less cellulitis. Wound VAC in place. Patient's ace wrap dressing is around her ankle instead of thigh.  Basic Metabolic Panel:  Recent Labs Lab 05/23/14 0016 05/23/14 4944 05/23/14 0639 05/24/14 0704  NA 139  --   --  136  K 3.3*  --   --  4.3  CL 100  --   --  96  CO2 27  --   --  26  GLUCOSE 117*  --   --  98  BUN 19  --   --  10  CREATININE 1.14* 1.08  --  0.93  CALCIUM 9.3  --   --  8.3*  MG  --   --  1.9  --    Liver Function Tests:  Recent Labs Lab 05/24/14 0704  AST 16  ALT 12  ALKPHOS 76  BILITOT 1.0  PROT 5.3*  ALBUMIN 2.8*   No results for input(s): LIPASE, AMYLASE in the last 168 hours. No results for input(s): AMMONIA in the last 168 hours. CBC:  Recent Labs Lab 05/23/14 0016 05/23/14 0638 05/24/14 0704  WBC 9.7 8.0 7.4  NEUTROABS 7.5  --  5.1  HGB 10.6* 9.7* 8.9*  HCT 34.3* 31.0* 28.8*  MCV 94.8 95.4 95.7  PLT 242 204 207   Cardiac Enzymes: No results for input(s): CKTOTAL, CKMB, CKMBINDEX, TROPONINI in the last 168 hours. BNP (last 3 results) No results for input(s): BNP in the last 8760 hours.  ProBNP (last 3 results) No results for input(s): PROBNP in the last 8760 hours.  CBG:  Recent Labs Lab 05/23/14 2149 05/24/14 0118 05/24/14 0409 05/24/14 0801 05/24/14 1112  GLUCAP 93 123* 94 100* 201*    Recent Results (from the past 240 hour(s))  Culture, blood (routine x 2)     Status: None (Preliminary  result)   Collection Time: 05/23/14  6:32 AM  Result Value Ref Range Status   Specimen Description BLOOD RIGHT HAND  Final   Special Requests BOTTLES DRAWN AEROBIC AND ANAEROBIC 8CCS  Final   Culture   Final           BLOOD CULTURE RECEIVED NO GROWTH TO DATE CULTURE WILL BE HELD FOR 5 DAYS BEFORE ISSUING A FINAL NEGATIVE REPORT Performed at [facility]    Report Status PENDING  Incomplete  Culture, blood (routine x 2)     Status: None (Preliminary result)   Collection Time: 05/23/14  8:18 AM  Result Value Ref Range Status   Specimen Description BLOOD LEFT ANTECUBITAL  Final   Special Requests BOTTLES DRAWN AEROBIC ONLY 5CC  Final   Culture   Final           BLOOD CULTURE RECEIVED NO GROWTH TO DATE CULTURE WILL BE HELD FOR 5 DAYS BEFORE ISSUING A FINAL NEGATIVE REPORT Performed at [facility]    Report Status PENDING  Incomplete  Surgical pcr screen     Status: Abnormal   Collection Time: 05/23/14  3:33 PM  Result Value Ref Range Status   MRSA, PCR POSITIVE (A) NEGATIVE Final   Staphylococcus aureus POSITIVE (A) NEGATIVE Final    Comment:        The Xpert SA Assay (FDA approved for NASAL specimens in patients over 80 years of age), is one component of a comprehensive surveillance program.  Test performance has been validated by [facility] for patients greater than or equal to 32 year old. It is not intended to diagnose infection nor to guide or monitor treatment.      Studies: Ct Femur Left Wo Contrast  05/23/2014   ADDENDUM REPORT: 05/23/2014 14:19  ADDENDUM: CORRECTION: The soft tissue fluid collection described in the report above is in the ANTERIOR LATERAL aspect of the left thigh rather than in the anterior medial aspect of the thigh.   Electronically Signed   By: Lorriane Shire M.D.   On: 05/23/2014 14:19   05/23/2014   CLINICAL DATA:  Surgery 2 weeks prior to the left leg for cellulitis. Now with recurrent cellulitis, fever, chills and bleeding in  the soft tissues.  EXAM: CT OF THE LEFT FEMUR WITHOUT CONTRAST  TECHNIQUE: Multidetector CT imaging was performed according to the standard protocol. Multiplanar CT image reconstructions were also generated. No intravenous contrast due to reported allergy.  COMPARISON:  CT lower extremity 05/07/2014  FINDINGS: Progressesive/recurrent soft tissue infection with development of confluent soft tissue fluid collection in the anterior medial left thigh. This appears elongated measuring 15 cm in cranial caudal dimension, 6.2 by 3.3 cm in transverse  by AP dimension. There is associated diffuse soft tissue edema, skin thickening, and induration. This is likely in continuity with a smaller ovoid fluid collection distally measuring 3.0 x 1.2 x 2.4 cm. A few foci of air seen within the anterior thigh muscle compartment that appears similar to prior exam, however the intramuscular fluid collection is less well-defined with diffuse muscular edema. No definite air in the fluid collection. Multifocal soft tissue calcifications are seen. No osseous erosion, periosteal reaction, or bony destructive change.  IMPRESSION: Anterior medial fluid collection in the soft tissues, 15 cm in craniocaudal dimension, most consistent with soft tissue abscess. Smaller ovoid fluid collection or distally measures 2.4 cm, may be in continuity. Hematoma could have a similar appearance, but is felt less likely. There is air within the anterior thigh muscle compartment, however no definite intramuscular fluid collection, detailed evaluation is limited given lack contrast.  Electronically Signed: By: Jeb Levering M.D. On: 05/23/2014 04:05    Scheduled Meds: . azithromycin  500 mg Intravenous Q24H  . clonazePAM  1 mg Oral BID  . docusate sodium  200 mg Oral BID  . doxycycline (VIBRAMYCIN) IV  100 mg Intravenous Q12H  . DULoxetine  60 mg Oral BID  . fentaNYL  25 mcg Transdermal Q72H  . furosemide  20 mg Oral BH-q7a  . gabapentin  800 mg Oral  TID  . heparin  5,000 Units Subcutaneous 3 times per day  . hydrochlorothiazide  25 mg Oral Daily  . insulin aspart  0-9 Units Subcutaneous 6 times per day  . pantoprazole  20 mg Oral Daily  . potassium chloride SA  10 mEq Oral Daily  . predniSONE  10 mg Oral Q breakfast  . rOPINIRole  1 mg Oral QHS  . spironolactone  25 mg Oral Daily   Continuous Infusions: . sodium chloride 75 mL/hr at 05/23/14 7342    Time spent: 25 minutes  [facility]  [email or web address], password [facility] 05/24/2014, 2:53 PM  LOS: 1 day

## 2014-05-24 NOTE — Evaluation (Signed)
Physical Therapy Evaluation Patient Details Name: [name] Carr MRN: [id number] DOB: 03-Jun-1955 Today's Date: 05/24/2014   History of Present Illness  [name] Carr is a 59 y.o. female with x of personality disorder, breast cancer SLE, prior history of manipulating central lines, either secondary gain +/- Munchausen's who has had thigh abscess with M fortuitum and more recently MRSA. s/p IRRIGATION AND DEBRIDEMENT EXTREMITY (Left) , with application of wound vac.  Clinical Impression  Pt admitted with above diagnosis. Pt currently with functional limitations due to the deficits listed below (see PT Problem List). Intermittently agitated, very lethargic resulting in poor sitting balance. Periods of alertness but not sustained. Refuses to ambulate. Poor awareness of safety managing lines/wound vac. Stated at one point, "If I keep falling asleep, I won't get more pain medicine." Pt will benefit from skilled PT to increase their independence and safety with mobility to allow discharge to the venue listed below.       Follow Up Recommendations SNF;Supervision/Assistance - 24 hour (Behavior Health setting if qualifies)    Equipment Recommendations  None recommended by PT    Recommendations for Other Services       Precautions / Restrictions Precautions Precautions: Fall Restrictions Weight Bearing Restrictions: No      Mobility  Bed Mobility Overal bed mobility: Needs Assistance Bed Mobility: Supine to Sit;Sit to Supine     Supine to sit: Supervision Sit to supine: Supervision   General bed mobility comments: supervision for safety. Needs assist to safely manage lines/vac tubing. Unaware of need for safe handling of vac.  Transfers Overall transfer level: Needs assistance Equipment used: None Transfers: Sit to/from [facility] Sit to Stand: Supervision Stand pivot transfers: Supervision       General transfer comment: supervision for safety with assist to  manage lines/wound vac appropriately. Neglects checking to see if cords are wrapped around her.   Ambulation/Gait             General Gait Details: refuses.   Stairs            Wheelchair Mobility    Modified Rankin (Stroke Patients Only)       Balance Overall balance assessment: Needs assistance Sitting-balance support: No upper extremity supported;Feet supported Sitting balance-Leahy Scale: Fair Sitting balance - Comments: sat on BSC for several minutes. Falls asleep quickly and needs cues to regain balance frequently. Postural control: Other (comment) (poor control due to lethargy) Standing balance support: No upper extremity supported Standing balance-Leahy Scale: Fair                               Pertinent Vitals/Pain Pain Assessment: Faces Faces Pain Scale: Hurts little more Pain Location: Lt thigh Pain Intervention(s): Monitored during session;Repositioned    Home Living   Living Arrangements: Alone Available Help at Discharge: Family;Friend(s);Available PRN/intermittently Type of Home: House Home Access: Stairs to enter Entrance Stairs-Rails: Right Entrance Stairs-Number of Steps: 5 Home Layout: One level Home Equipment: Walker - 2 wheels;Walker - 4 wheels;Cane - single point;Bedside commode;Shower seat;Electric scooter      Prior Function Level of Independence: Needs assistance   Gait / Transfers Assistance Needed: cane for mobility  ADL's / Homemaking Assistance Needed: states she could bath/dress to a certain point - does not state who assists        Hand Dominance   Dominant Hand: Right    Extremity/Trunk Assessment   Upper Extremity Assessment: Defer to OT  evaluation           Lower Extremity Assessment: Difficult to assess due to impaired cognition         Communication   Communication: No difficulties  Cognition Arousal/Alertness: Lethargic;Suspect due to medications Behavior During Therapy: Agitated  (intermittently) Overall Cognitive Status: Difficult to assess       Memory: Decreased short-term memory              General Comments General comments (skin integrity, edema, etc.): Willing to practice transfer to [facility] for toileting. Refused to ambulate. Intermittently agitated with poor short term memory while obtaining history. Falls asleep easily and almost immediately but awakens the very next moment and continues to utter uncomprehensible words.    Exercises        Assessment/Plan    PT Assessment Patient needs continued PT services  PT Diagnosis Altered mental status;Acute pain   PT Problem List Decreased activity tolerance;Decreased balance;Decreased mobility;Decreased cognition;Decreased safety awareness;Decreased knowledge of precautions;Pain  PT Treatment Interventions DME instruction;Gait training;Functional mobility training;Therapeutic activities;Therapeutic exercise;Balance training;Cognitive remediation;Neuromuscular re-education;Patient/family education;Modalities   PT Goals (Current goals can be found in the Care Plan section) Acute Rehab PT Goals Patient Stated Goal: none stated PT Goal Formulation: Patient unable to participate in goal setting Time For Goal Achievement: 06/07/14 Potential to Achieve Goals: Fair Additional Goals Additional Goal #1:  (Manages wound vac safely with transfers and gait.)    Frequency Min 3X/week   Barriers to discharge Decreased caregiver support alone    Co-evaluation               End of Session   Activity Tolerance: Patient limited by lethargy;Treatment limited secondary to agitation Patient left: in bed;with call bell/phone within reach;with bed alarm set Nurse Communication: Mobility status         Time: 7902-4097 PT Time Calculation (min) (ACUTE ONLY): 21 min   Charges:   PT Evaluation $Initial PT Evaluation Tier I: 1 Procedure     PT G CodesEllouise Newer 05/24/2014, 1:57 PM Elayne Snare, [place]

## 2014-05-24 NOTE — Progress Notes (Signed)
[facility]    Subjective: Complaining she is not receiving enough Dilaudid   Antibiotics:  Anti-infectives    Start     Dose/Rate Route Frequency Ordered Stop   05/24/14 0800  Oritavancin Diphosphate (ORBACTIV) 1,200 mg in dextrose 5 % IVPB     1,200 mg 333.3 mL/hr over 180 Minutes Intravenous Once 05/23/14 2059 05/24/14 1255   05/23/14 2200  azithromycin (ZITHROMAX) tablet 500 mg  Status:  Discontinued     500 mg Oral Daily at bedtime 05/23/14 2057 05/23/14 2059   05/23/14 2200  doxycycline (VIBRA-TABS) tablet 100 mg  Status:  Discontinued     100 mg Oral 2 times daily 05/23/14 2057 05/23/14 2059   05/23/14 0600  azithromycin (ZITHROMAX) 500 mg in dextrose 5 % 250 mL IVPB     500 mg 250 mL/hr over 60 Minutes Intravenous Every 24 hours 05/23/14 0555     05/23/14 0600  doxycycline (VIBRAMYCIN) 100 mg in dextrose 5 % 250 mL IVPB     100 mg 125 mL/hr over 120 Minutes Intravenous Every 12 hours 05/23/14 0555     05/23/14 0100  azithromycin (ZITHROMAX) 500 mg in dextrose 5 % 250 mL IVPB     500 mg 250 mL/hr over 60 Minutes Intravenous  Once 05/23/14 0051 05/23/14 0312   05/23/14 0100  doxycycline (VIBRAMYCIN) 100 mg in dextrose 5 % 250 mL IVPB     100 mg 125 mL/hr over 120 Minutes Intravenous  Once 05/23/14 0051 05/23/14 0520      Medications: Scheduled Meds: . azithromycin  500 mg Intravenous Q24H  . clonazePAM  1 mg Oral BID  . docusate sodium  200 mg Oral BID  . doxycycline (VIBRAMYCIN) IV  100 mg Intravenous Q12H  . DULoxetine  60 mg Oral BID  . fentaNYL  25 mcg Transdermal Q72H  . furosemide  20 mg Oral BH-[id number]  . gabapentin  800 mg Oral TID  . heparin  5,000 Units Subcutaneous 3 times per day  . hydrochlorothiazide  25 mg Oral Daily  . insulin aspart  0-9 Units Subcutaneous 6 times per day  . pantoprazole  20 mg Oral Daily  . potassium chloride SA  10 mEq Oral Daily  . predniSONE  10 mg Oral Q breakfast  . rOPINIRole  1 mg Oral QHS  .  spironolactone  25 mg Oral Daily   Continuous Infusions: . sodium chloride 75 mL/hr at 05/23/14 0627   PRN Meds:.acetaminophen **OR** acetaminophen, fentaNYL (SUBLIMAZE) injection, HYDROmorphone (DILAUDID) injection, LORazepam, metoCLOPramide **OR** metoCLOPramide (REGLAN) injection, ondansetron **OR** ondansetron (ZOFRAN) IV, promethazine, promethazine, tiZANidine, traZODone    Objective: Weight change:   Intake/Output Summary (Last 24 hours) at 05/24/14 1305 Last data filed at 05/23/14 2004  Gross per 24 hour  Intake    300 ml  Output     50 ml  Net    250 ml   Blood pressure 110/74, pulse 70, temperature 98 F (36.7 C), temperature source Oral, resp. rate 16, height 5\' 4"  (1.626 m), weight 188 lb 15 oz (85.7 kg), SpO2 92 %. Temp:  [97.5 F (36.4 C)-100.1 F (37.8 C)] 98 F (36.7 C) (04/16 1304) Pulse Rate:  [70-98] 70 (04/16 1304) Resp:  [14-22] 16 (04/16 1304) BP: (98-159)/(55-93) 110/74 mmHg (04/16 1304) SpO2:  [92 %-100 %] 92 % (04/16 1304) FiO2 (%):  [28 %] 28 % (04/15 2040)  Physical Exam: General: Alert and awake, oriented x3, recognized me before I did her HEENT:  anicteric sclera, pupils reactive to light and accommodation, EOMI, oropharynx clear and without exudate CVS regular rate, normal r, no murmur rubs or gallops Chest: r, no wheezing, rales or rhonchi Extremities: thigh swollen with sutures in place Neuro: nonfocal, strength and sensation intact  CBC:  CBC Latest Ref Rng 05/24/2014 05/23/2014 05/23/2014  WBC 4.0 - 10.5 K/uL 7.4 8.0 9.7  Hemoglobin 12.0 - 15.0 g/dL 8.9(L) 9.7(L) 10.6(L)  Hematocrit 36.0 - 46.0 % 28.8(L) 31.0(L) 34.3(L)  Platelets 150 - 400 K/uL 207 204 242       BMET  Recent Labs  05/23/14 0016 05/23/14 0638 05/24/14 0704  NA 139  --  136  K 3.3*  --  4.3  CL 100  --  96  CO2 27  --  26  GLUCOSE 117*  --  98  BUN 19  --  10  CREATININE 1.14* 1.08 0.93  CALCIUM 9.3  --  8.3*     Liver Panel   Recent Labs   05/24/14 0704  PROT 5.3*  ALBUMIN 2.8*  AST 16  ALT 12  ALKPHOS 76  BILITOT 1.0       Sedimentation Rate No results for input(s): ESRSEDRATE in the last 72 hours. C-Reactive Protein No results for input(s): CRP in the last 72 hours.  Micro Results: Recent Results (from the past 720 hour(s))  Surgical pcr screen     Status: Abnormal   Collection Time: 05/08/14 11:29 PM  Result Value Ref Range Status   MRSA, PCR POSITIVE (A) NEGATIVE Final    Comment: RESULT CALLED TO, READ BACK BY AND VERIFIED WITH: MERRITT,A/5E @0224  ON 05/09/14 BY KARCZEWSKI,S.    Staphylococcus aureus POSITIVE (A) NEGATIVE Final    Comment:        The Xpert SA Assay (FDA approved for NASAL specimens in patients over 47 years of age), is one component of a comprehensive surveillance program.  Test performance has been validated by [facility] for patients greater than or equal to 85 year old. It is not intended to diagnose infection nor to guide or monitor treatment.   Anaerobic culture     Status: None   Collection Time: 05/09/14  4:31 PM  Result Value Ref Range Status   Specimen Description ABSCESS LEFT THIGH  Final   Special Requests PATIENT ON FOLLOWING DOXYCYCLINE  Final   Gram Stain   Final    NO WBC SEEN NO SQUAMOUS EPITHELIAL CELLS SEEN NO ORGANISMS SEEN Performed at [facility]    Culture   Final    NO ANAEROBES ISOLATED Performed at [facility]    Report Status 05/14/2014 FINAL  Final  Gram stain     Status: None   Collection Time: 05/09/14  4:31 PM  Result Value Ref Range Status   Specimen Description ABSCESS LEFT THIGH  Final   Special Requests NONE  Final   Gram Stain   Final    FEW WBC PRESENT, PREDOMINANTLY PMN NO ORGANISMS SEEN Gram Stain Report Called to,Read Back By and Verified With: LITTLE,L. RN @1755  ON 4.1.16 BY MCCOY,N.    Report Status 05/09/2014 FINAL  Final  Culture, routine-abscess     Status: None   Collection Time: 05/09/14  4:31 PM   Result Value Ref Range Status   Specimen Description ABSCESS LEFT THIGH  Final   Special Requests PATIENT ON FOLLOWING DOXYCYCLINE  Final   Gram Stain   Final    NO WBC SEEN NO SQUAMOUS EPITHELIAL CELLS SEEN NO  ORGANISMS SEEN Performed at [facility]    Culture   Final    RARE STAPHYLOCOCCUS AUREUS Note: RIFAMPIN AND GENTAMICIN SHOULD NOT BE USED AS SINGLE DRUGS FOR TREATMENT OF STAPH INFECTIONS. Performed at [facility]    Report Status 05/12/2014 FINAL  Final  Tissue culture     Status: None   Collection Time: 05/09/14  4:31 PM  Result Value Ref Range Status   Specimen Description ABSCESS LEFT THIGH  Final   Special Requests PATIENT ON FOLLOWING DOXYCUCLINE  Final   Gram Stain   Final    RARE WBC PRESENT, PREDOMINANTLY MONONUCLEAR NO ORGANISMS SEEN Performed at [facility]    Culture   Final    FEW METHICILLIN RESISTANT STAPHYLOCOCCUS AUREUS Note: RIFAMPIN AND GENTAMICIN SHOULD NOT BE USED AS SINGLE DRUGS FOR TREATMENT OF STAPH INFECTIONS. This organism DOES NOT demonstrate inducible Clindamycin resistance in vitro. CRITICAL RESULT CALLED TO, READ BACK BY AND VERIFIED WITH: Edwyna Ready RN  05/13/14 AT 8:30 AM BY MORAC DOXYCYCLINE SENSITIVE Performed at [facility]    Report Status 05/13/2014 FINAL  Final   Organism ID, Bacteria METHICILLIN RESISTANT STAPHYLOCOCCUS AUREUS  Final      Susceptibility   Methicillin resistant staphylococcus aureus - MIC*    CLINDAMYCIN <=0.25 SENSITIVE Sensitive     ERYTHROMYCIN >=8 RESISTANT Resistant     GENTAMICIN <=0.5 SENSITIVE Sensitive     LEVOFLOXACIN 4 INTERMEDIATE Intermediate     OXACILLIN >=4 RESISTANT Resistant     PENICILLIN >=0.5 RESISTANT Resistant     RIFAMPIN <=0.5 SENSITIVE Sensitive     TRIMETH/SULFA <=10 SENSITIVE Sensitive     VANCOMYCIN 1 SENSITIVE Sensitive     TETRACYCLINE <=1 SENSITIVE Sensitive     * FEW METHICILLIN RESISTANT STAPHYLOCOCCUS AUREUS  Culture, blood (routine x 2)      Status: None (Preliminary result)   Collection Time: 05/23/14  6:32 AM  Result Value Ref Range Status   Specimen Description BLOOD RIGHT HAND  Final   Special Requests BOTTLES DRAWN AEROBIC AND ANAEROBIC 8CCS  Final   Culture   Final           BLOOD CULTURE RECEIVED NO GROWTH TO DATE CULTURE WILL BE HELD FOR 5 DAYS BEFORE ISSUING A FINAL NEGATIVE REPORT Performed at [facility]    Report Status PENDING  Incomplete  Culture, blood (routine x 2)     Status: None (Preliminary result)   Collection Time: 05/23/14  8:18 AM  Result Value Ref Range Status   Specimen Description BLOOD LEFT ANTECUBITAL  Final   Special Requests BOTTLES DRAWN AEROBIC ONLY 5CC  Final   Culture   Final           BLOOD CULTURE RECEIVED NO GROWTH TO DATE CULTURE WILL BE HELD FOR 5 DAYS BEFORE ISSUING A FINAL NEGATIVE REPORT Performed at [facility]    Report Status PENDING  Incomplete  Surgical pcr screen     Status: Abnormal   Collection Time: 05/23/14  3:33 PM  Result Value Ref Range Status   MRSA, PCR POSITIVE (A) NEGATIVE Final   Staphylococcus aureus POSITIVE (A) NEGATIVE Final    Comment:        The Xpert SA Assay (FDA approved for NASAL specimens in patients over 39 years of age), is one component of a comprehensive surveillance program.  Test performance has been validated by [facility] for patients greater than or equal to 28 year old. It is not intended  to diagnose infection nor to guide or monitor treatment.     Studies/Results: Ct Femur Left Wo Contrast  05/23/2014   ADDENDUM REPORT: 05/23/2014 14:19  ADDENDUM: CORRECTION: The soft tissue fluid collection described in the report above is in the ANTERIOR LATERAL aspect of the left thigh rather than in the anterior medial aspect of the thigh.   Electronically Signed   By: Lorriane Shire M.D.   On: 05/23/2014 14:19   05/23/2014   CLINICAL DATA:  Surgery 2 weeks prior to the left leg for cellulitis. Now with recurrent  cellulitis, fever, chills and bleeding in the soft tissues.  EXAM: CT OF THE LEFT FEMUR WITHOUT CONTRAST  TECHNIQUE: Multidetector CT imaging was performed according to the standard protocol. Multiplanar CT image reconstructions were also generated. No intravenous contrast due to reported allergy.  COMPARISON:  CT lower extremity 05/07/2014  FINDINGS: Progressesive/recurrent soft tissue infection with development of confluent soft tissue fluid collection in the anterior medial left thigh. This appears elongated measuring 15 cm in cranial caudal dimension, 6.2 by 3.3 cm in transverse by AP dimension. There is associated diffuse soft tissue edema, skin thickening, and induration. This is likely in continuity with a smaller ovoid fluid collection distally measuring 3.0 x 1.2 x 2.4 cm. A few foci of air seen within the anterior thigh muscle compartment that appears similar to prior exam, however the intramuscular fluid collection is less well-defined with diffuse muscular edema. No definite air in the fluid collection. Multifocal soft tissue calcifications are seen. No osseous erosion, periosteal reaction, or bony destructive change.  IMPRESSION: Anterior medial fluid collection in the soft tissues, 15 cm in craniocaudal dimension, most consistent with soft tissue abscess. Smaller ovoid fluid collection or distally measures 2.4 cm, may be in continuity. Hematoma could have a similar appearance, but is felt less likely. There is air within the anterior thigh muscle compartment, however no definite intramuscular fluid collection, detailed evaluation is limited given lack contrast.  Electronically Signed: By: Jeb Levering M.D. On: 05/23/2014 04:05      Assessment/Plan:  Active Problems:   Thigh abscess   Abscess   MRSA infection   Munchausen syndrome   Malingering   Mixed personality disorder   Laceration of lower leg with infection    [name] Carr is a 59 y.o. female with x of personality disorder,   breast cancer SLE, prior history of manipulating central lines, either secondary gain +/- Munchausen's who has had thigh abscess with M fortuitum and more recently MRSA    #1 Thigh Abscess:  She is receiving long actin ORITOVANCIN which should give her SEVERAL WEEKS TO A MONTH OF ANTI MRSA activity  With re to the M fortuitum she needs to be treated with at least two active drugs and THEY SHOULD BE ORAL ANTIBIOTICS IMO SUCH AS AVELOX AND AZITHO OR DOXY AND AZITHRO (WHAT SHE WAS RECENTLY SENT HOME ON)  I HAVE DECIDED THAT I WILL MYSELF NOT PERSONALLY SUPERVISE IV ABX FOR HER IN SNF AND CERTAINLY NOT AT HOME PERIOD  I BELIEVE WE SHOULD TRANSITION HER BACK TO ORAL ABX AND TRY TO MAXIMIZE ANTIEMETICS, SHE CLAIMS SHE CANNOT TOLERATE THEM AFTER 4 DAYS, WELL WE COULD KEEP HER HERE FOR 4 DAYS AND THEN SEE HOW SHE DOES AND TRY TO MAXIMIZE ANTIEMIETICS, ETC  DR. Linus Salmons IS TAKING OVER ON Monday, IT IS POSSIBLE THAT HE WOULD AGREE TO MANAGER HER WITH IV ABX IN SNF, BUT I SINCERELY DOUBT IT AND AS PREVIOUSLY POINTED  OUT SHE WOULD HAVE TO BE IN SUCH A SETTIN FOR AT LEAST 3 MONTHS IF NOT LONGER LIKELY 6 MONTHS TO CURE THE M FORTUITUM AND SHE IS ONLY AGREEABLE TO A MONTH AT BEST AND THEN SHE EXPECTS TO HOME WITH IV ABX  I will followup her cultures for now and Dr. Linus Salmons will take over the service on Monday   LOS: 1 day   Alcide Evener 05/24/2014, 1:05 PM

## 2014-05-25 ENCOUNTER — Inpatient Hospital Stay (HOSPITAL_COMMUNITY): Payer: Medicare Other

## 2014-05-25 DIAGNOSIS — R400 Somnolence: Secondary | ICD-10-CM

## 2014-05-25 DIAGNOSIS — I959 Hypotension, unspecified: Secondary | ICD-10-CM | POA: Diagnosis present

## 2014-05-25 DIAGNOSIS — I9589 Other hypotension: Secondary | ICD-10-CM

## 2014-05-25 DIAGNOSIS — R509 Fever, unspecified: Secondary | ICD-10-CM

## 2014-05-25 LAB — CBC WITH DIFFERENTIAL/PLATELET
BASOS PCT: 0 % (ref 0–1)
Basophils Absolute: 0 10*3/uL (ref 0.0–0.1)
EOS ABS: 0.1 10*3/uL (ref 0.0–0.7)
Eosinophils Relative: 1 % (ref 0–5)
HCT: 29.5 % — ABNORMAL LOW (ref 36.0–46.0)
HEMOGLOBIN: 9.4 g/dL — AB (ref 12.0–15.0)
LYMPHS ABS: 0.6 10*3/uL — AB (ref 0.7–4.0)
LYMPHS PCT: 6 % — AB (ref 12–46)
MCH: 29.7 pg (ref 26.0–34.0)
MCHC: 31.9 g/dL (ref 30.0–36.0)
MCV: 93.4 fL (ref 78.0–100.0)
MONO ABS: 0.9 10*3/uL (ref 0.1–1.0)
Monocytes Relative: 9 % (ref 3–12)
NEUTROS ABS: 8.9 10*3/uL — AB (ref 1.7–7.7)
NEUTROS PCT: 84 % — AB (ref 43–77)
Platelets: 148 10*3/uL — ABNORMAL LOW (ref 150–400)
RBC: 3.16 MIL/uL — ABNORMAL LOW (ref 3.87–5.11)
RDW: 15.9 % — ABNORMAL HIGH (ref 11.5–15.5)
WBC: 10.5 10*3/uL (ref 4.0–10.5)

## 2014-05-25 LAB — GLUCOSE, CAPILLARY
GLUCOSE-CAPILLARY: 178 mg/dL — AB (ref 70–99)
GLUCOSE-CAPILLARY: 170 mg/dL — AB (ref 70–99)
GLUCOSE-CAPILLARY: 116 mg/dL — AB (ref 70–99)
Glucose-Capillary: 90 mg/dL (ref 70–99)
Glucose-Capillary: 189 mg/dL — ABNORMAL HIGH (ref 70–99)

## 2014-05-25 LAB — URINALYSIS, ROUTINE W REFLEX MICROSCOPIC
Bilirubin Urine: NEGATIVE
GLUCOSE, UA: NEGATIVE mg/dL
Hgb urine dipstick: NEGATIVE
Ketones, ur: NEGATIVE mg/dL
LEUKOCYTES UA: NEGATIVE
NITRITE: NEGATIVE
Protein, ur: NEGATIVE mg/dL
Specific Gravity, Urine: 1.005 (ref 1.005–1.030)
Urobilinogen, UA: 0.2 mg/dL (ref 0.0–1.0)
pH: 6.5 (ref 5.0–8.0)

## 2014-05-25 LAB — RAPID URINE DRUG SCREEN, HOSP PERFORMED
Amphetamines: NOT DETECTED
Barbiturates: NOT DETECTED
Benzodiazepines: NOT DETECTED
COCAINE: NOT DETECTED
OPIATES: POSITIVE — AB
TETRAHYDROCANNABINOL: NOT DETECTED

## 2014-05-25 MED ORDER — OXYCODONE HCL 5 MG PO TABS
5.0000 mg | ORAL_TABLET | Freq: Once | ORAL | Status: AC
  Administered 2014-05-25: 5 mg via ORAL

## 2014-05-25 MED ORDER — ACETAMINOPHEN 325 MG PO TABS
650.0000 mg | ORAL_TABLET | Freq: Once | ORAL | Status: AC
  Administered 2014-05-25: 650 mg via ORAL
  Filled 2014-05-25: qty 2

## 2014-05-25 MED ORDER — OXYCODONE HCL 5 MG PO TABS
5.0000 mg | ORAL_TABLET | ORAL | Status: DC | PRN
  Administered 2014-05-25: 5 mg via ORAL
  Administered 2014-05-25 – 2014-05-29 (×15): 10 mg via ORAL
  Filled 2014-05-25 (×11): qty 2
  Filled 2014-05-25 (×2): qty 1
  Filled 2014-05-25 (×4): qty 2

## 2014-05-25 MED ORDER — SODIUM CHLORIDE 0.9 % IV BOLUS (SEPSIS)
500.0000 mL | Freq: Once | INTRAVENOUS | Status: AC
  Administered 2014-05-25: 500 mL via INTRAVENOUS

## 2014-05-25 MED ORDER — NALOXONE HCL 0.4 MG/ML IJ SOLN: 0.4000 mg | INTRAMUSCULAR | Status: DC | PRN

## 2014-05-25 NOTE — Progress Notes (Signed)
Pt consented to RN and Charge RN Levada Dy searching patient's belongings. The search revealed a pill organizer with multiple types of tablets. Charge RN took the pill Environmental education officer to the pharmacy. Bed in lowest position, call bell within reach, will continue to monitor closely.

## 2014-05-25 NOTE — Progress Notes (Signed)
Dr. Conley Canal paged per Pt request. New orders received. Will continue to monitor pt closely.

## 2014-05-25 NOTE — Progress Notes (Signed)
Dr. Conley Canal notified of pill found in pt's bed during linen change. RN noted that the pill found did not resemble any medication scheduled to be administered to the pt. RN asked pt if she was taking her home medications, pt denied, stating that the pill 'may have fallen out of her girlfriends' pocket who visited yesterday'. MD made aware of pt's lethargic state, decreased BP. Unit charge RN aware. Will continue to monitor closely

## 2014-05-25 NOTE — Progress Notes (Signed)
Pt requested RN bedside. Pt upset and crying because pain medication has been discontinued. Pt also upset, stating her belonging were not placed back in her bag 'properly' and that the Charge RN and I should not have removed her bag from her hospital room. RN educated pt that her belongings were not removed from the room and only the pill dispenser was sent to the pharmacy. Pt states that she forgot the pill dispenser was in her purse and states that she did not take any of the medication in her purse. Pt requested to speak to MD, RN advised that she would page Dr. Conley Canal for her. Bed in lowest position, call bell within reach, will continue to monitor closely. RN K. Corum bedside for conversation with pt.

## 2014-05-25 NOTE — Progress Notes (Addendum)
[facility] PROGRESS NOTE  [name] Carr JYN:[id number] DOB: Sep 23, 1955 DOA: 05/23/2014 PCP: [name], MD Summary 59 year old white female with history of left thigh abscess requiring I and D, recent cultures positive for MRSA and mycobacteria fortuitum. Return with worsening pain and CAT scan showing abscess. Orthopedics and infectious disease consulted. Status post I and D by Dr. Marlou Sa on 05/23/2014.  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  Assessment/Plan:  Principal Problem:   Thigh abscess:  Status post irrigation and debridement, application of wound VAC by Dr. Marlou Sa on 4/15. PT recommending skilled nursing facility. I agree. Have consulted social work.  Patient received Oritavancin 4/16 which will cover MRSA for 2-4 weeks. Cultures pending. Currently getting IV azithromycin and doxycycline. Patient reports that she can only tolerate "4 days of any oral antibiotic" before vomiting starts. Agree with the plan to transition to oral while in-house. PICC line in this woman is not a good idea.  Will defer any changes to infectious disease. AFB and bacterial cultures pending.  Active Problems: Somnolence, suspect drug misuse in hospital:  This just started today.  she had a visitor yesterday.   Also, with an unknown tablet was found in her bed which does not match the medications she has been prescribed. Nurses searched patient's belongings and she had multiple tablets in a pill organizer which were sent to pharmacy.  According to pharmacy, these were identified as Zanaflex, trazodone, Tylenol, Protonix, Lasix, spironolactone, and Soma. Will hold all sedating medications for now. Patient is arousable but falls back asleep. Will monitor on continuous pulse ox and change vitals to every 4 hours.  Will get stat urine drug screen.  Relative hypotension: Received a liter of saline.  May be  related to polypharmacy/surreptitious drug abuse. Monitor closely.    Fever: Repeat blood cultures drawn. Urinalysis ordered and urine culture. Chest x-ray shows no infiltrate.    SLE-on prednisone-Follow at [place] behavior:      Anemia:  Will monitor  Code Status:  full Family Communication:  Patient is alert and oriented Disposition Plan:  Eventually to skilled nursing facility  Consultants:  Orthopedics, Dr. Marlou Sa  Infectious disease  Procedures:   Left thigh I and D, wound VAC placement  Antibiotics: oritavancin 4/16 Doxy, azthro 4/15 -   HPI/Subjective: No complaints.  Objective: Filed Vitals:   05/25/14 1409  BP: 91/41  Pulse: 61  Temp: 98 F (36.7 C)  Resp: 10   No intake or output data in the 24 hours ending 05/25/14 1500 Filed Weights   05/23/14 0827  Weight: 85.7 kg (188 lb 15 oz)    Exam:   General:  Asleep. Arousable. Answers questions and falls back asleep. Melted ice cream tray noted on her bed linens.  Cardiovascular: Regular rate rhythm without murmurs gallops rubs  Respiratory:  Clear to auscultation bilaterally without wheezes rhonchi or rales   Abdomen: Soft nontender nondistended  Ext: Left thigh Ace wrap in place. Wound VAC connected.  Basic Metabolic Panel:  Recent Labs Lab 05/23/14 0016 05/23/14 8657 05/23/14 0639 05/24/14 0704  NA 139  --   --  136  K 3.3*  --   --  4.3  CL 100  --   --  96  CO2 27  --   --  26  GLUCOSE 117*  --   --  98  BUN 19  --   --  10  CREATININE  1.14* 1.08  --  0.93  CALCIUM 9.3  --   --  8.3*  MG  --   --  1.9  --    Liver Function Tests:  Recent Labs Lab 05/24/14 0704  AST 16  ALT 12  ALKPHOS 76  BILITOT 1.0  PROT 5.3*  ALBUMIN 2.8*   No results for input(s): LIPASE, AMYLASE in the last 168 hours. No results for input(s): AMMONIA in the last 168 hours. CBC:  Recent Labs Lab 05/23/14 0016 05/23/14 0638 05/24/14 0704 05/25/14 0922  WBC 9.7 8.0 7.4 10.5   NEUTROABS 7.5  --  5.1 8.9*  HGB 10.6* 9.7* 8.9* 9.4*  HCT 34.3* 31.0* 28.8* 29.5*  MCV 94.8 95.4 95.7 93.4  PLT 242 204 207 148*   Cardiac Enzymes: No results for input(s): CKTOTAL, CKMB, CKMBINDEX, TROPONINI in the last 168 hours. BNP (last 3 results) No results for input(s): BNP in the last 8760 hours.  ProBNP (last 3 results) No results for input(s): PROBNP in the last 8760 hours.  CBG:  Recent Labs Lab 05/24/14 1626 05/24/14 2017 05/24/14 2148 05/25/14 0707 05/25/14 1128  GLUCAP 94 107* 90 189* 178*    Recent Results (from the past 240 hour(s))  Culture, blood (routine x 2)     Status: None (Preliminary result)   Collection Time: 05/23/14  6:32 AM  Result Value Ref Range Status   Specimen Description BLOOD RIGHT HAND  Final   Special Requests BOTTLES DRAWN AEROBIC AND ANAEROBIC 8CCS  Final   Culture   Final           BLOOD CULTURE RECEIVED NO GROWTH TO DATE CULTURE WILL BE HELD FOR 5 DAYS BEFORE ISSUING A FINAL NEGATIVE REPORT Performed at [facility]    Report Status PENDING  Incomplete  Culture, blood (routine x 2)     Status: None (Preliminary result)   Collection Time: 05/23/14  8:18 AM  Result Value Ref Range Status   Specimen Description BLOOD LEFT ANTECUBITAL  Final   Special Requests BOTTLES DRAWN AEROBIC ONLY 5CC  Final   Culture   Final           BLOOD CULTURE RECEIVED NO GROWTH TO DATE CULTURE WILL BE HELD FOR 5 DAYS BEFORE ISSUING A FINAL NEGATIVE REPORT Performed at [facility]    Report Status PENDING  Incomplete  Surgical pcr screen     Status: Abnormal   Collection Time: 05/23/14  3:33 PM  Result Value Ref Range Status   MRSA, PCR POSITIVE (A) NEGATIVE Final   Staphylococcus aureus POSITIVE (A) NEGATIVE Final    Comment:        The Xpert SA Assay (FDA approved for NASAL specimens in patients over 19 years of age), is one component of a comprehensive surveillance program.  Test performance has been validated by  [facility] for patients greater than or equal to 51 year old. It is not intended to diagnose infection nor to guide or monitor treatment.   Anaerobic culture     Status: None (Preliminary result)   Collection Time: 05/23/14  7:36 PM  Result Value Ref Range Status   Specimen Description ABSCESS THIGH LEFT  Final   Special Requests PATIENT ON FOLLOWING DOXYCYCLINE, ZITHROMAX  Final   Gram Stain PENDING  Incomplete   Culture   Final    NO ANAEROBES ISOLATED; CULTURE IN PROGRESS FOR 5 DAYS Performed at [facility]    Report Status PENDING  Incomplete  Culture,  routine-abscess     Status: None (Preliminary result)   Collection Time: 05/23/14  7:36 PM  Result Value Ref Range Status   Specimen Description ABSCESS THIGH LEFT  Final   Special Requests NONE  Final   Gram Stain PENDING  Incomplete   Culture   Final    MODERATE STAPHYLOCOCCUS AUREUS Note: RIFAMPIN AND GENTAMICIN SHOULD NOT BE USED AS SINGLE DRUGS FOR TREATMENT OF STAPH INFECTIONS. Performed at [facility]    Report Status PENDING  Incomplete  Fungus Culture with Smear     Status: None (Preliminary result)   Collection Time: 05/23/14  7:36 PM  Result Value Ref Range Status   Specimen Description ABSCESS THIGH LEFT  Final   Special Requests PATIENT ON FOLLOWING DOXYCYCLINE, ZITHROMAX  Final   Fungal Smear   Final    NO YEAST OR FUNGAL ELEMENTS SEEN Performed at [facility]    Culture   Final    CULTURE IN PROGRESS FOR FOUR WEEKS Performed at [facility]    Report Status PENDING  Incomplete  Tissue culture     Status: None (Preliminary result)   Collection Time: 05/23/14  7:45 PM  Result Value Ref Range Status   Specimen Description TISSUE THIGH LEFT  Final   Special Requests PATIENT ON FOLLOWING DOXYCYCLINE, ZITHROMAX  Final   Gram Stain PENDING  Incomplete   Culture   Final    NO GROWTH 1 DAY Performed at [facility]    Report Status PENDING  Incomplete  Anaerobic  culture     Status: None (Preliminary result)   Collection Time: 05/23/14  7:45 PM  Result Value Ref Range Status   Specimen Description TISSUE THIGH LEFT  Final   Special Requests PATIENT ON FOLLOWING ZITHROMAX, DOXYCYCLINE  Final   Gram Stain PENDING  Incomplete   Culture   Final    NO ANAEROBES ISOLATED; CULTURE IN PROGRESS FOR 5 DAYS Performed at [facility]    Report Status PENDING  Incomplete  Fungus Culture with Smear     Status: None (Preliminary result)   Collection Time: 05/23/14  7:45 PM  Result Value Ref Range Status   Specimen Description TISSUE THIGH LEFT  Final   Special Requests PATIENT ON FOLLOWING ZITHROMAX, DOXYCYCLINE  Final   Fungal Smear   Final    NO YEAST OR FUNGAL ELEMENTS SEEN Performed at [facility]    Culture   Final    CULTURE IN PROGRESS FOR FOUR WEEKS Performed at [facility]    Report Status PENDING  Incomplete     Studies: Dg Chest Port 1 View  05/25/2014   CLINICAL DATA:  Fever and altered mental status. History of MRSA and a thigh abscess.  EXAM: PORTABLE CHEST - 1 VIEW  COMPARISON:  01/16/2014  FINDINGS: The heart size and mediastinal contours are within normal limits. Both lungs are clear. No pleural effusion or pneumothorax. The visualized skeletal structures are unremarkable.  IMPRESSION: No active disease.   Electronically Signed   By: Lajean Manes M.D.   On: 05/25/2014 12:49    Scheduled Meds: . azithromycin  500 mg Intravenous Q24H  . docusate sodium  200 mg Oral BID  . doxycycline (VIBRAMYCIN) IV  100 mg Intravenous Q12H  . DULoxetine  60 mg Oral BID  . furosemide  20 mg Oral BH-q7a  . heparin  5,000 Units Subcutaneous 3 times per day  . insulin aspart  0-9 Units Subcutaneous 6 times per  day  . pantoprazole  20 mg Oral Daily  . predniSONE  10 mg Oral Q breakfast  . sodium chloride  500 mL Intravenous Once  . sodium chloride  500 mL Intravenous Once  . spironolactone  25 mg Oral Daily   Continuous  Infusions: . sodium chloride Stopped (05/24/14 1917)    Time spent: 45 minutes  [facility]  [email or web address], password [facility] 05/25/2014, 3:00 PM  LOS: 2 days

## 2014-05-25 NOTE — Progress Notes (Signed)
Pt requested RN to bedside. Pt is upset and crying, she states that the PO pain medication administered will not touch her pain and that she wanted the MD paged for IV pain medication. RN advised that she would page Dr. Conley Canal.

## 2014-05-25 NOTE — Progress Notes (Signed)
Dr. Conley Canal notified of pt's temperature of 102.6. New orders received. Will continue to monitor pt closely.

## 2014-05-25 NOTE — Progress Notes (Addendum)
[place] for Infectious Disease    Subjective:  i HAD EXTENSIVE CONVERSATION WITH THE PATIENT TO ACCUSE ME OF BEING responsible for her NOT being allowed to be at home with PICC lines due to what transpired in 2012.  She accused the staff of [facility] at that time of lying and accuse met very as other individuals of lying as well. I reminded her that her track record with home health agencies had not been limited to the experience with me 4 years ago     Antibiotics:  Anti-infectives    Start     Dose/Rate Route Frequency Ordered Stop   05/24/14 0800  Oritavancin Diphosphate (ORBACTIV) 1,200 mg in dextrose 5 % IVPB     1,200 mg 333.3 mL/hr over 180 Minutes Intravenous Once 05/23/14 2059 05/24/14 1255   05/23/14 2200  azithromycin (ZITHROMAX) tablet 500 mg  Status:  Discontinued     500 mg Oral Daily at bedtime 05/23/14 2057 05/23/14 2059   05/23/14 2200  doxycycline (VIBRA-TABS) tablet 100 mg  Status:  Discontinued     100 mg Oral 2 times daily 05/23/14 2057 05/23/14 2059   05/23/14 0600  azithromycin (ZITHROMAX) 500 mg in dextrose 5 % 250 mL IVPB     500 mg 250 mL/hr over 60 Minutes Intravenous Every 24 hours 05/23/14 0555     05/23/14 0600  doxycycline (VIBRAMYCIN) 100 mg in dextrose 5 % 250 mL IVPB     100 mg 125 mL/hr over 120 Minutes Intravenous Every 12 hours 05/23/14 0555     05/23/14 0100  azithromycin (ZITHROMAX) 500 mg in dextrose 5 % 250 mL IVPB     500 mg 250 mL/hr over 60 Minutes Intravenous  Once 05/23/14 0051 05/23/14 0312   05/23/14 0100  doxycycline (VIBRAMYCIN) 100 mg in dextrose 5 % 250 mL IVPB     100 mg 125 mL/hr over 120 Minutes Intravenous  Once 05/23/14 0051 05/23/14 0520      Medications: Scheduled Meds: . azithromycin  500 mg Intravenous Q24H  . clonazePAM  1 mg Oral BID  . docusate sodium  200 mg Oral BID  . doxycycline (VIBRAMYCIN) IV  100 mg Intravenous Q12H  . DULoxetine  60 mg Oral BID  . fentaNYL  25 mcg Transdermal Q72H    . furosemide  20 mg Oral BH-q7a  . gabapentin  800 mg Oral TID  . heparin  5,000 Units Subcutaneous 3 times per day  . insulin aspart  0-9 Units Subcutaneous 6 times per day  . pantoprazole  20 mg Oral Daily  . predniSONE  10 mg Oral Q breakfast  . rOPINIRole  1 mg Oral QHS  . sodium chloride  500 mL Intravenous Once  . sodium chloride  500 mL Intravenous Once  . spironolactone  25 mg Oral Daily   Continuous Infusions: . sodium chloride Stopped (05/24/14 1917)   PRN Meds:.acetaminophen **OR** acetaminophen, HYDROmorphone (DILAUDID) injection, metoCLOPramide **OR** metoCLOPramide (REGLAN) injection, naLOXone (NARCAN)  injection, ondansetron **OR** ondansetron (ZOFRAN) IV, promethazine, promethazine, tiZANidine, traZODone    Objective: Weight change:  No intake or output data in the 24 hours ending 05/25/14 1406 Blood pressure 89/46, pulse 57, temperature 97.9 F (36.6 C), temperature source Oral, resp. rate 15, height 5' 4"  (1.626 m), weight 188 lb 15 oz (85.7 kg), SpO2 96 %. Temp:  [97.9 F (36.6 C)-102.9 F (39.4 C)] 97.9 F (36.6 C) (04/17 1029) Pulse Rate:  [57-96] 57 (04/17 1333) Resp:  [15-20] 15 (04/17  1333) BP: (89-127)/(39-63) 89/46 mmHg (04/17 1333) SpO2:  [92 %-99 %] 96 % (04/17 1333)  Physical Exam: General: Alert and awake, oriented x3, extensive discussion and arguments about how to approach her care  HEENT: anicteric sclera, pupils reactive to light and accommodation, EOMI, oropharynx clear and without exudate  Neuro: nonfocal, strength and sensation intact  CBC:  CBC Latest Ref Rng 05/25/2014 05/24/2014 05/23/2014  WBC 4.0 - 10.5 K/uL 10.5 7.4 8.0  Hemoglobin 12.0 - 15.0 g/dL 9.4(L) 8.9(L) 9.7(L)  Hematocrit 36.0 - 46.0 % 29.5(L) 28.8(L) 31.0(L)  Platelets 150 - 400 K/uL 148(L) 207 204       BMET  Recent Labs  05/23/14 0016 05/23/14 0638 05/24/14 0704  NA 139  --  136  K 3.3*  --  4.3  CL 100  --  96  CO2 27  --  26  GLUCOSE 117*  --  98   BUN 19  --  10  CREATININE 1.14* 1.08 0.93  CALCIUM 9.3  --  8.3*     Liver Panel   Recent Labs  05/24/14 0704  PROT 5.3*  ALBUMIN 2.8*  AST 16  ALT 12  ALKPHOS 76  BILITOT 1.0       Sedimentation Rate No results for input(s): ESRSEDRATE in the last 72 hours. C-Reactive Protein No results for input(s): CRP in the last 72 hours.  Micro Results: Recent Results (from the past 720 hour(s))  Surgical pcr screen     Status: Abnormal   Collection Time: 05/08/14 11:29 PM  Result Value Ref Range Status   MRSA, PCR POSITIVE (A) NEGATIVE Final    Comment: RESULT CALLED TO, READ BACK BY AND VERIFIED WITH: MERRITT,A/5E @0224  ON 05/09/14 BY KARCZEWSKI,S.    Staphylococcus aureus POSITIVE (A) NEGATIVE Final    Comment:        The Xpert SA Assay (FDA approved for NASAL specimens in patients over 72 years of age), is one component of a comprehensive surveillance program.  Test performance has been validated by [facility] for patients greater than or equal to 41 year old. It is not intended to diagnose infection nor to guide or monitor treatment.   Anaerobic culture     Status: None   Collection Time: 05/09/14  4:31 PM  Result Value Ref Range Status   Specimen Description ABSCESS LEFT THIGH  Final   Special Requests PATIENT ON FOLLOWING DOXYCYCLINE  Final   Gram Stain   Final    NO WBC SEEN NO SQUAMOUS EPITHELIAL CELLS SEEN NO ORGANISMS SEEN Performed at [facility]    Culture   Final    NO ANAEROBES ISOLATED Performed at [facility]    Report Status 05/14/2014 FINAL  Final  Gram stain     Status: None   Collection Time: 05/09/14  4:31 PM  Result Value Ref Range Status   Specimen Description ABSCESS LEFT THIGH  Final   Special Requests NONE  Final   Gram Stain   Final    FEW WBC PRESENT, PREDOMINANTLY PMN NO ORGANISMS SEEN Gram Stain Report Called to,Read Back By and Verified With: LITTLE,L. RN @1755  ON 4.1.16 BY MCCOY,N.    Report  Status 05/09/2014 FINAL  Final  Culture, routine-abscess     Status: None   Collection Time: 05/09/14  4:31 PM  Result Value Ref Range Status   Specimen Description ABSCESS LEFT THIGH  Final   Special Requests PATIENT ON FOLLOWING DOXYCYCLINE  Final   Gram Stain  Final    NO WBC SEEN NO SQUAMOUS EPITHELIAL CELLS SEEN NO ORGANISMS SEEN Performed at [facility]    Culture   Final    RARE STAPHYLOCOCCUS AUREUS Note: RIFAMPIN AND GENTAMICIN SHOULD NOT BE USED AS SINGLE DRUGS FOR TREATMENT OF STAPH INFECTIONS. Performed at [facility]    Report Status 05/12/2014 FINAL  Final  Tissue culture     Status: None   Collection Time: 05/09/14  4:31 PM  Result Value Ref Range Status   Specimen Description ABSCESS LEFT THIGH  Final   Special Requests PATIENT ON FOLLOWING DOXYCUCLINE  Final   Gram Stain   Final    RARE WBC PRESENT, PREDOMINANTLY MONONUCLEAR NO ORGANISMS SEEN Performed at [facility]    Culture   Final    FEW METHICILLIN RESISTANT STAPHYLOCOCCUS AUREUS Note: RIFAMPIN AND GENTAMICIN SHOULD NOT BE USED AS SINGLE DRUGS FOR TREATMENT OF STAPH INFECTIONS. This organism DOES NOT demonstrate inducible Clindamycin resistance in vitro. CRITICAL RESULT CALLED TO, READ BACK BY AND VERIFIED WITH: Edwyna Ready RN  05/13/14 AT 8:30 AM BY MORAC DOXYCYCLINE SENSITIVE Performed at [facility]    Report Status 05/13/2014 FINAL  Final   Organism ID, Bacteria METHICILLIN RESISTANT STAPHYLOCOCCUS AUREUS  Final      Susceptibility   Methicillin resistant staphylococcus aureus - MIC*    CLINDAMYCIN <=0.25 SENSITIVE Sensitive     ERYTHROMYCIN >=8 RESISTANT Resistant     GENTAMICIN <=0.5 SENSITIVE Sensitive     LEVOFLOXACIN 4 INTERMEDIATE Intermediate     OXACILLIN >=4 RESISTANT Resistant     PENICILLIN >=0.5 RESISTANT Resistant     RIFAMPIN <=0.5 SENSITIVE Sensitive     TRIMETH/SULFA <=10 SENSITIVE Sensitive     VANCOMYCIN 1 SENSITIVE Sensitive      TETRACYCLINE <=1 SENSITIVE Sensitive     * FEW METHICILLIN RESISTANT STAPHYLOCOCCUS AUREUS  Culture, blood (routine x 2)     Status: None (Preliminary result)   Collection Time: 05/23/14  6:32 AM  Result Value Ref Range Status   Specimen Description BLOOD RIGHT HAND  Final   Special Requests BOTTLES DRAWN AEROBIC AND ANAEROBIC 8CCS  Final   Culture   Final           BLOOD CULTURE RECEIVED NO GROWTH TO DATE CULTURE WILL BE HELD FOR 5 DAYS BEFORE ISSUING A FINAL NEGATIVE REPORT Performed at [facility]    Report Status PENDING  Incomplete  Culture, blood (routine x 2)     Status: None (Preliminary result)   Collection Time: 05/23/14  8:18 AM  Result Value Ref Range Status   Specimen Description BLOOD LEFT ANTECUBITAL  Final   Special Requests BOTTLES DRAWN AEROBIC ONLY 5CC  Final   Culture   Final           BLOOD CULTURE RECEIVED NO GROWTH TO DATE CULTURE WILL BE HELD FOR 5 DAYS BEFORE ISSUING A FINAL NEGATIVE REPORT Performed at [facility]    Report Status PENDING  Incomplete  Surgical pcr screen     Status: Abnormal   Collection Time: 05/23/14  3:33 PM  Result Value Ref Range Status   MRSA, PCR POSITIVE (A) NEGATIVE Final   Staphylococcus aureus POSITIVE (A) NEGATIVE Final    Comment:        The Xpert SA Assay (FDA approved for NASAL specimens in patients over 30 years of age), is one component of a comprehensive surveillance program.  Test performance has been validated by [facility] for  patients greater than or equal to 53 year old. It is not intended to diagnose infection nor to guide or monitor treatment.   Culture, routine-abscess     Status: None (Preliminary result)   Collection Time: 05/23/14  7:36 PM  Result Value Ref Range Status   Specimen Description ABSCESS THIGH LEFT  Final   Special Requests NONE  Final   Gram Stain PENDING  Incomplete   Culture   Final    MODERATE STAPHYLOCOCCUS AUREUS Note: RIFAMPIN AND GENTAMICIN SHOULD NOT BE USED  AS SINGLE DRUGS FOR TREATMENT OF STAPH INFECTIONS. Performed at [facility]    Report Status PENDING  Incomplete  Tissue culture     Status: None (Preliminary result)   Collection Time: 05/23/14  7:45 PM  Result Value Ref Range Status   Specimen Description TISSUE THIGH LEFT  Final   Special Requests PATIENT ON FOLLOWING DOXYCYCLINE, ZITHROMAX  Final   Gram Stain PENDING  Incomplete   Culture   Final    NO GROWTH 1 DAY Performed at [facility]    Report Status PENDING  Incomplete    Studies/Results: Dg Chest Port 1 View  05/25/2014   CLINICAL DATA:  Fever and altered mental status. History of MRSA and a thigh abscess.  EXAM: PORTABLE CHEST - 1 VIEW  COMPARISON:  01/16/2014  FINDINGS: The heart size and mediastinal contours are within normal limits. Both lungs are clear. No pleural effusion or pneumothorax. The visualized skeletal structures are unremarkable.  IMPRESSION: No active disease.   Electronically Signed   By: Lajean Manes M.D.   On: 05/25/2014 12:49      Assessment/Plan:  Principal Problem:   Thigh abscess Active Problems:   SLE-on prednisone-Follow at [facility]   Drug-seeking behavior   Abscess   MRSA infection   Munchausen syndrome   Malingering   Mixed personality disorder   Anemia    [name] Carr is a 59 y.o. female with x of personality disorder,  breast cancer SLE, prior history of manipulating central lines, either secondary gain +/- Munchausen's who has had thigh abscess with M fortuitum and more recently MRSA    #1 Fever: Likely still due to thigh abscess and hopefully NOT an indication that she needs further debridement but that would be my initial thought as she has received ORITOVANCIN which will be active vs MRSA for a month  If fever persists would re-image THIGH with MRI with contrast  Orthopedics should be following  Obviously I do not want to make unsupported suppositions but in the past the patient was witnessed manipulating  and scratching at a surgical wound on her arm by RN staff--something I disucussed with the pt and which she said is a "LIE!"  #2 Thigh abscess: sp ORITOVANCIN for MRSA  Continue IV doxy and IV azithromycin for now  Truman Hayward see my prior notes.  I PERSONALLY WILL NOT SUPERVISE HER WITH PICC LINE IN SNF OR AT HOME THOUGH DR. CAMPBELL HAD MENTIONED CONSIDERING DOING SO IN SNF.   I AM WORRIED THAT SHE IS HIGH RISK FOR :"MISBEHAVING" WITH CENTRAL LINES NO MATTER WHERE SHE IS AND TODAY SHE WAS FOUND TO HAVE A PILL IN BED THAT DID NOT MATCH ANY THAT ARE BEING GIVEN TO HER  I WILL LET DR. Linus Salmons DECIDE IF HE IS WILLING TO SUPERVISE THE PATIENT IN A SKILLED NURSING FACILITY THE WITH iv ANTIBIOTICS. i HAVE REMINDED THE PATIENT THAT SHE WOULD NEED PROTRACTED ANTIBIOTICS FOR HER mYCOBACTERIUM FORTUITUM INFECTION.  mY PREFERENCE RATHER THAN TRYING TO SEND HER TO A FACILITY WITH A picc LINE WOULD BE TO WISH HER TO ORAL ANTIBIOTICS IN THE HOSPITAL AND OBSERVE HER FOR WEEK AND OBSERVE WHAT HER BEHAVIOR EXACTLY IS AND WHEN THE MEDICINES ARE CAUSING HER TO VOMIT AS SHE CLAIMS.  sHE IS HIGHLY MANIPULATIVE AND ARGUMENTATIVE. i WOULD STRONGLY RECOMMEND GETTING IN TOUCH WITH RISK MANAGEMENT i CALLED TO GET IN TOUCH WITH THEM BUT THE PERSON WHO IS ON CALL HIS AT HOME TODAY. wOULD TOUCH WITH THEM IN THE MORNING.  I spent greater than 40 minutes with the patient including greater than 50% of time in face to face discussions with the patient and in coordination of her care.   Dr. Linus Salmons will take over the service tomorrow.   LOS: 2 days   Alcide Evener 05/25/2014, 2:06 PM

## 2014-05-26 ENCOUNTER — Encounter (HOSPITAL_COMMUNITY): Payer: Self-pay | Admitting: Orthopedic Surgery

## 2014-05-26 DIAGNOSIS — R509 Fever, unspecified: Secondary | ICD-10-CM | POA: Insufficient documentation

## 2014-05-26 DIAGNOSIS — B9689 Other specified bacterial agents as the cause of diseases classified elsewhere: Secondary | ICD-10-CM

## 2014-05-26 LAB — CBC WITH DIFFERENTIAL/PLATELET
BASOS PCT: 0 % (ref 0–1)
Basophils Absolute: 0 10*3/uL (ref 0.0–0.1)
Eosinophils Absolute: 0.1 10*3/uL (ref 0.0–0.7)
Eosinophils Relative: 1 % (ref 0–5)
HCT: 30 % — ABNORMAL LOW (ref 36.0–46.0)
Hemoglobin: 9.6 g/dL — ABNORMAL LOW (ref 12.0–15.0)
Lymphocytes Relative: 18 % (ref 12–46)
Lymphs Abs: 2 10*3/uL (ref 0.7–4.0)
MCH: 29.5 pg (ref 26.0–34.0)
MCHC: 32 g/dL (ref 30.0–36.0)
MCV: 92.3 fL (ref 78.0–100.0)
Monocytes Absolute: 1 10*3/uL (ref 0.1–1.0)
Monocytes Relative: 9 % (ref 3–12)
NEUTROS ABS: 7.7 10*3/uL (ref 1.7–7.7)
NEUTROS PCT: 72 % (ref 43–77)
Platelets: 226 10*3/uL (ref 150–400)
RBC: 3.25 MIL/uL — ABNORMAL LOW (ref 3.87–5.11)
RDW: 15.5 % (ref 11.5–15.5)
WBC: 10.7 10*3/uL — ABNORMAL HIGH (ref 4.0–10.5)

## 2014-05-26 LAB — GLUCOSE, CAPILLARY
GLUCOSE-CAPILLARY: 86 mg/dL (ref 70–99)
GLUCOSE-CAPILLARY: 124 mg/dL — AB (ref 70–99)
GLUCOSE-CAPILLARY: 139 mg/dL — AB (ref 70–99)
GLUCOSE-CAPILLARY: 75 mg/dL (ref 70–99)
Glucose-Capillary: 54 mg/dL — ABNORMAL LOW (ref 70–99)
Glucose-Capillary: 114 mg/dL — ABNORMAL HIGH (ref 70–99)
Glucose-Capillary: 76 mg/dL (ref 70–99)

## 2014-05-26 LAB — BASIC METABOLIC PANEL
Anion gap: 15 (ref 5–15)
BUN: 14 mg/dL (ref 6–23)
CHLORIDE: 94 mmol/L — AB (ref 96–112)
CO2: 26 mmol/L (ref 19–32)
Calcium: 8.6 mg/dL (ref 8.4–10.5)
Creatinine, Ser: 1 mg/dL (ref 0.50–1.10)
GFR calc non Af Amer: 60 mL/min — ABNORMAL LOW (ref >90)
GFR, EST AFRICAN AMERICAN: 70 mL/min — AB (ref >90)
GLUCOSE: 115 mg/dL — AB (ref 70–99)
POTASSIUM: 3.7 mmol/L (ref 3.5–5.1)
SODIUM: 135 mmol/L (ref 135–145)

## 2014-05-26 LAB — CULTURE, ROUTINE-ABSCESS

## 2014-05-26 MED ORDER — HYDROMORPHONE HCL 1 MG/ML IJ SOLN
1.0000 mg | INTRAMUSCULAR | Status: DC
Start: 2014-05-26 — End: 2014-05-29
  Administered 2014-05-26 – 2014-05-28 (×2): 1 mg via INTRAVENOUS
  Filled 2014-05-26 (×2): qty 1

## 2014-05-26 MED ORDER — AZITHROMYCIN 500 MG PO TABS
500.0000 mg | ORAL_TABLET | Freq: Every day | ORAL | Status: DC
  Administered 2014-05-26 – 2014-05-29 (×4): 500 mg via ORAL
  Filled 2014-05-26 (×4): qty 1

## 2014-05-26 MED ORDER — CLONAZEPAM 0.5 MG PO TABS
0.5000 mg | ORAL_TABLET | Freq: Two times a day (BID) | ORAL | Status: DC | PRN
  Administered 2014-05-27 – 2014-05-29 (×4): 0.5 mg via ORAL
  Filled 2014-05-26 (×4): qty 1

## 2014-05-26 MED ORDER — DOXYCYCLINE HYCLATE 100 MG PO TABS
100.0000 mg | ORAL_TABLET | Freq: Two times a day (BID) | ORAL | Status: DC
  Administered 2014-05-26 – 2014-05-29 (×7): 100 mg via ORAL
  Filled 2014-05-26 (×7): qty 1

## 2014-05-26 NOTE — Progress Notes (Signed)
Pt stable  Wound vac ok cx - mrsa wbat with PT Dilaudid with dressing changes only May take 3 -4 weeks

## 2014-05-26 NOTE — Op Note (Signed)
NAME:  [name] Carr, [name] Carr NO.:  [id number]  MEDICAL RECORD NO.:  [id number]  LOCATION:                                FACILITY:  [facility]  PHYSICIAN:  Anderson Malta, M.D.    DATE OF BIRTH:  1955/02/24  DATE OF PROCEDURE: DATE OF DISCHARGE:                              OPERATIVE REPORT   PREOPERATIVE DIAGNOSIS:  Left leg hematoma/potential recurrent infection.  POSTOPERATIVE DIAGNOSIS:  Left leg hematoma/potential recurrent infection.  PROCEDURE:  Left leg I and D, excisional debridement, cultures sent, wound VAC placed.  SURGEON:  Anderson Malta, M.D.  ASSISTANT:  None.  ANESTHESIA:  General.  [name] Carr is a 60 year old patient 2 weeks.  She underwent I and D, left leg, potential abscess presents now for operative management of recurrent fluid collection in superficial aspect after explanation of risks and benefits.  Cultures obtained.  Tissue sent for culture x1. Fluid sent for culture x2 for both Gram stain, aerobes and anaerobes culture, fungi, and mycobacteria.  PROCEDURE IN DETAIL:  The patient was brought to the operating room where general anesthetic was induced.  Perioperative antibiotics were administered.  Time-out was called.  Left leg was prepped with Hibiclens and draped in a sterile manner.  The patient has a fluid collection above the fascia from the previous incision.  This was sent for culture. Incision extended proximally and distally about 2 cm.  Excisional debridement performed which was minimally devitalized skin, subcutaneous tissue, muscle, and fascia.  No obvious recurrent sources of infection were present.  There was a hematoma collection, however, at this time. Following excisional debridement, thorough irrigation 3 L of irrigating solution was performed.  The wound was then left open back was placed because there was concern amount of dead space which needed to be closed.  Back was placed.  Back we changed weekly for 4 weeks.   Continue with IV antibiotics.  Cultures were sent of tissue which was from multiple granulomas appearing areas on her legs.     Anderson Malta, M.D.     GSD/MEDQ  D:  05/23/2014  T:  05/24/2014  Job:  [id number]

## 2014-05-26 NOTE — Progress Notes (Signed)
Pt did not sleep any overnight, constantly calling for assist from various things such as medication questions that had been answered multiple times, accusing RN of not giving certain medications that were administered. Called RN for things such as wanting phone number to utility companies, for assistance with ordering items from TV infomercials,to borrow ink pens multiple times after several had been provided to her and for snacks that she had not eaten. Several hours were spent trying to make Pt comfortable, yet she accuses RN of not giving her enough time to take care of her needs. Pt often becomes tearful and attempts to manipulate staff.

## 2014-05-26 NOTE — Progress Notes (Addendum)
Chaplain referred to pt from chaplain nurse. Pt described her long term medical conditions to chaplain. Chaplain provided empathetic listening. Pt emotionally supported by close friends/neighbors and faith is important to her. Pt identified journaling and creating greeting cards as helpful cooing strategies in her life. Chaplain provided pt with a pen so she can journal. During visit pt did not want to wait for nursing tech to use the restroom. Pt stated "I know the bed alarm will go off, but I do not want to pee myself." Chaplain estimates time between call button pressed and pt's exit from the bed to be three minutes. Chaplain got nurses help as soon as possible. Pt nurse explained fall precautions to pt and helped her back to the bed. Chaplain paged away but anticipates follow up. Page chaplain as needed.    05/26/14 1000  Clinical Encounter Type  Visited With Patient;Health care provider  Visit Type Initial;Spiritual support  Referral From Nurse  Spiritual Encounters  Spiritual Needs Emotional  Stress Factors  Patient Stress Factors Health changes  [name] Carr, Barbette Hair, Chaplain 05/26/2014 10:17 AM

## 2014-05-26 NOTE — Progress Notes (Signed)
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 Return with worsening pain and CAT scan showing abscess. Orthopedics and infectious disease consulted. Status post I and D by Dr. Marlou Sa on 05/23/2014.  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  Assessment/Plan:    Thigh abscess:  Status post irrigation and debridement, application of wound VAC by Dr. Marlou Sa on 4/15. PT recommending skilled nursing facility-  Patient received Oritavancin 4/16 which will cover MRSA for 2-4 weeks. Cultures pending. Currently getting IV azithromycin and doxycycline. Patient reports that she can only tolerate "4 days of any oral antibiotic" before vomiting starts. Agree with the plan to transition to oral while in-house. PICC line in this woman is not a good idea.    AFB and bacterial cultures pending.  Somnolence, suspect drug misuse in hospital:   -4/18: unknown tablet was found in her bed which does not match the medications she has been prescribed. Nurses searched patient's belongings and she had multiple tablets in a pill organizer which were sent to pharmacy.    Fever: Repeat blood cultures drawn. Urinalysis ordered and urine culture. Chest x-ray shows no infiltrate.    SLE-on prednisone-Follow at [facility] behavior:      Anemia:  Will monitor  Code Status:  full Family Communication:  Patient Disposition Plan:  Eventually to skilled nursing facility- if patient agrees  Consultants:  Orthopedics, Dr. Marlou Sa  Infectious disease  Procedures:   Left thigh I and D, wound VAC placement  Antibiotics: oritavancin 4/16 Doxy, azthro 4/15 -   HPI/Subjective: "Dr. Eliseo Squires I thought when  you walked in the door, you were a better doctor than Dr. Conley Canal but since you will not resume my fentayl and klonopin and Zanaflex, you are not!"  Objective: Filed Vitals:   05/26/14 0545  BP: 116/48  Pulse: 93  Temp: 101 F (38.3 C)  Resp: 16    Intake/Output Summary (Last 24 hours) at 05/26/14 1213 Last data filed at 05/25/14 1354  Gross per 24 hour  Intake   1100 ml  Output      0 ml  Net   1100 ml   Filed Weights   05/23/14 0827  Weight: 85.7 kg (188 lb 15 oz)    Exam:   General:  Awake on bedside commode,   Cardiovascular: Regular rate rhythm without murmurs gallops rubs  Respiratory:  Clear to auscultation bilaterally without wheezes rhonchi or rales   Ext: . Wound VAC on left thigh  Basic Metabolic Panel:  Recent Labs Lab 05/23/14 0016 05/23/14 0638 05/23/14 0639 05/24/14 0704 05/26/14 0757  NA 139  --   --  136 135  K 3.3*  --   --  4.3 3.7  CL 100  --   --  96 94*  CO2 27  --   --  26 26  GLUCOSE 117*  --   --  98 115*  BUN 19  --   --  10 14  CREATININE 1.14* 1.08  --  0.93 1.00  CALCIUM 9.3  --   --  8.3* 8.6  MG  --   --  1.9  --   --    Liver Function Tests:  Recent Labs Lab 05/24/14 0704  AST 16  ALT 12  ALKPHOS 76  BILITOT 1.0  PROT 5.3*  ALBUMIN 2.8*   No results for input(s): LIPASE, AMYLASE in the last 168 hours. No results for input(s): AMMONIA in the last 168 hours. CBC:  Recent Labs Lab 05/23/14 0016 05/23/14 6962 05/24/14 0704 05/25/14 0922 05/26/14 0757  WBC 9.7 8.0 7.4 10.5 10.7*  NEUTROABS 7.5  --  5.1 8.9* 7.7  HGB 10.6* 9.7* 8.9* 9.4* 9.6*  HCT 34.3* 31.0* 28.8* 29.5* 30.0*  MCV 94.8 95.4 95.7 93.4 92.3  PLT 242 204 207 148* 226   Cardiac Enzymes: No results for input(s): CKTOTAL, CKMB, CKMBINDEX, TROPONINI in the last 168 hours. BNP (last 3 results) No results for input(s): BNP in the last 8760 hours.  ProBNP (last 3 results) No results for input(s): PROBNP in the last 8760  hours.  CBG:  Recent Labs Lab 05/25/14 1128 05/25/14 1544 05/25/14 2147 05/26/14 0652 05/26/14 1139  GLUCAP 178* 170* 116* 114* 139*    Recent Results (from the past 240 hour(s))  Culture, blood (routine x 2)     Status: None (Preliminary result)   Collection Time: 05/23/14  6:32 AM  Result Value Ref Range Status   Specimen Description BLOOD RIGHT HAND  Final   Special Requests BOTTLES DRAWN AEROBIC AND ANAEROBIC 8CCS  Final   Culture   Final           BLOOD CULTURE RECEIVED NO GROWTH TO DATE CULTURE WILL BE HELD FOR 5 DAYS BEFORE ISSUING A FINAL NEGATIVE REPORT Performed at [facility]    Report Status PENDING  Incomplete  Culture, blood (routine x 2)     Status: None (Preliminary result)   Collection Time: 05/23/14  8:18 AM  Result Value Ref Range Status   Specimen Description BLOOD LEFT ANTECUBITAL  Final   Special Requests BOTTLES DRAWN AEROBIC ONLY 5CC  Final   Culture   Final           BLOOD CULTURE RECEIVED NO GROWTH TO DATE CULTURE WILL BE HELD FOR 5 DAYS BEFORE ISSUING A FINAL NEGATIVE REPORT Performed at [facility]    Report Status PENDING  Incomplete  Surgical pcr screen     Status: Abnormal   Collection Time: 05/23/14  3:33 PM  Result Value Ref Range Status   MRSA, PCR POSITIVE (A) NEGATIVE Final   Staphylococcus aureus POSITIVE (A) NEGATIVE Final    Comment:        The Xpert SA Assay (FDA approved for NASAL specimens in patients over 43 years of age), is one component of a comprehensive surveillance program.  Test performance has been validated by [facility] for patients greater than or equal to 82 year old. It is not intended to diagnose infection nor to guide or monitor treatment.   Anaerobic culture     Status: None (Preliminary result)   Collection Time: 05/23/14  7:36 PM  Result Value Ref Range Status   Specimen Description ABSCESS THIGH LEFT  Final   Special Requests PATIENT ON FOLLOWING DOXYCYCLINE, ZITHROMAX  Final    Gram Stain PENDING  Incomplete   Culture   Final    NO ANAEROBES ISOLATED; CULTURE IN PROGRESS FOR 5 DAYS Performed at [facility]    Report Status PENDING  Incomplete  Culture, routine-abscess     Status: None (Preliminary result)   Collection Time: 05/23/14  7:36 PM  Result Value Ref Range Status   Specimen Description ABSCESS THIGH LEFT  Final   Special  Requests NONE  Final   Gram Stain PENDING  Incomplete   Culture   Final    MODERATE METHICILLIN RESISTANT STAPHYLOCOCCUS AUREUS Note: RIFAMPIN AND GENTAMICIN SHOULD NOT BE USED AS SINGLE DRUGS FOR TREATMENT OF STAPH INFECTIONS. CRITICAL RESULT CALLED TO, READ BACK BY AND VERIFIED WITH:  DAPHINE JOHNSON RN @0850  05/26/14 BY KENDR Performed at [facility]    Report Status PENDING  Incomplete   Organism ID, Bacteria METHICILLIN RESISTANT STAPHYLOCOCCUS AUREUS  Final      Susceptibility   Methicillin resistant staphylococcus aureus - MIC*    CLINDAMYCIN >=8 RESISTANT Resistant     ERYTHROMYCIN >=8 RESISTANT Resistant     GENTAMICIN <=0.5 SENSITIVE Sensitive     LEVOFLOXACIN >=8 RESISTANT Resistant     OXACILLIN >=4 RESISTANT Resistant     PENICILLIN >=0.5 RESISTANT Resistant     RIFAMPIN <=0.5 SENSITIVE Sensitive     TRIMETH/SULFA <=10 SENSITIVE Sensitive     VANCOMYCIN 1 SENSITIVE Sensitive     TETRACYCLINE >=16 RESISTANT Resistant     * MODERATE METHICILLIN RESISTANT STAPHYLOCOCCUS AUREUS  Fungus Culture with Smear     Status: None (Preliminary result)   Collection Time: 05/23/14  7:36 PM  Result Value Ref Range Status   Specimen Description ABSCESS THIGH LEFT  Final   Special Requests PATIENT ON FOLLOWING DOXYCYCLINE, ZITHROMAX  Final   Fungal Smear   Final    NO YEAST OR FUNGAL ELEMENTS SEEN Performed at [facility]    Culture   Final    CULTURE IN PROGRESS FOR FOUR WEEKS Performed at [facility]    Report Status PENDING  Incomplete  AFB culture with smear     Status: None  (Preliminary result)   Collection Time: 05/23/14  7:36 PM  Result Value Ref Range Status   Specimen Description ABSCESS THIGH LEFT  Final   Special Requests PATIENT ON FOLLOWING DOXYCYCLINE, ZITHROMAX  Final   Acid Fast Smear   Final    NO ACID FAST BACILLI SEEN Performed at [facility]    Culture   Final    CULTURE WILL BE EXAMINED FOR 6 WEEKS BEFORE ISSUING A FINAL REPORT Performed at [facility]    Report Status PENDING  Incomplete  Tissue culture     Status: None (Preliminary result)   Collection Time: 05/23/14  7:45 PM  Result Value Ref Range Status   Specimen Description TISSUE THIGH LEFT  Final   Special Requests PATIENT ON FOLLOWING DOXYCYCLINE, ZITHROMAX  Final   Gram Stain PENDING  Incomplete   Culture   Final    NO GROWTH 2 DAYS Performed at [facility]    Report Status PENDING  Incomplete  Anaerobic culture     Status: None (Preliminary result)   Collection Time: 05/23/14  7:45 PM  Result Value Ref Range Status   Specimen Description TISSUE THIGH LEFT  Final   Special Requests PATIENT ON FOLLOWING ZITHROMAX, DOXYCYCLINE  Final   Gram Stain PENDING  Incomplete   Culture   Final    NO ANAEROBES ISOLATED; CULTURE IN PROGRESS FOR 5 DAYS Performed at [facility]    Report Status PENDING  Incomplete  Fungus Culture with Smear     Status: None (Preliminary result)   Collection Time: 05/23/14  7:45 PM  Result Value Ref Range Status   Specimen Description TISSUE THIGH LEFT  Final   Special Requests PATIENT ON FOLLOWING ZITHROMAX, DOXYCYCLINE  Final   Fungal Smear  Final    NO YEAST OR FUNGAL ELEMENTS SEEN Performed at [facility]    Culture   Final    CULTURE IN PROGRESS FOR FOUR WEEKS Performed at [facility]    Report Status PENDING  Incomplete  AFB culture with smear     Status: None (Preliminary result)   Collection Time: 05/23/14  7:45 PM  Result Value Ref Range Status   Specimen Description TISSUE  THIGH LEFT  Final   Special Requests PATIENT ON FOLLOWING ZITHROMAX, DOXYCYCLINE  Final   Acid Fast Smear   Final    NO ACID FAST BACILLI SEEN Performed at [facility]    Culture   Final    CULTURE WILL BE EXAMINED FOR 6 WEEKS BEFORE ISSUING A FINAL REPORT Performed at [facility]    Report Status PENDING  Incomplete  Culture, blood (routine x 2)     Status: None (Preliminary result)   Collection Time: 05/25/14  9:10 AM  Result Value Ref Range Status   Specimen Description BLOOD HAND RIGHT  Final   Special Requests BOTTLES DRAWN AEROBIC ONLY 3CC  Final   Culture   Final           BLOOD CULTURE RECEIVED NO GROWTH TO DATE CULTURE WILL BE HELD FOR 5 DAYS BEFORE ISSUING A FINAL NEGATIVE REPORT Performed at [facility]    Report Status PENDING  Incomplete  Culture, blood (routine x 2)     Status: None (Preliminary result)   Collection Time: 05/25/14  9:22 AM  Result Value Ref Range Status   Specimen Description BLOOD HAND LEFT  Final   Special Requests BOTTLES DRAWN AEROBIC ONLY 4CC  Final   Culture   Final           BLOOD CULTURE RECEIVED NO GROWTH TO DATE CULTURE WILL BE HELD FOR 5 DAYS BEFORE ISSUING A FINAL NEGATIVE REPORT Performed at [facility]    Report Status PENDING  Incomplete     Studies: Dg Chest Port 1 View  05/25/2014   CLINICAL DATA:  Fever and altered mental status. History of MRSA and a thigh abscess.  EXAM: PORTABLE CHEST - 1 VIEW  COMPARISON:  01/16/2014  FINDINGS: The heart size and mediastinal contours are within normal limits. Both lungs are clear. No pleural effusion or pneumothorax. The visualized skeletal structures are unremarkable.  IMPRESSION: No active disease.   Electronically Signed   By: Lajean Manes M.D.   On: 05/25/2014 12:49    Scheduled Meds: . azithromycin  500 mg Oral Daily  . docusate sodium  200 mg Oral BID  . doxycycline  100 mg Oral Q12H  . DULoxetine  60 mg Oral BID  . heparin  5,000 Units  Subcutaneous 3 times per day  .  HYDROmorphone (DILAUDID) injection  1 mg Intravenous Once per day on Mon Wed Fri  . insulin aspart  0-9 Units Subcutaneous 6 times per day  . pantoprazole  20 mg Oral Daily  . predniSONE  10 mg Oral Q breakfast   Continuous Infusions: . sodium chloride Stopped (05/24/14 1917)    Time spent: 55 minutes  Eliseo Squires Nabila Albarracin  [facility] [phone]  [email or web address], password [email or web address] 05/26/2014, 12:13 PM  LOS: 3 days

## 2014-05-26 NOTE — Consult Note (Signed)
WOC wound consult note Reason for Consult: Consult requested for left thigh dressing Wound type: Full thickness post-op wound Measurement: 18X5X3cm with 2 cm undermining to wound edges. Wound bed: Beefy red with exposed muscles Drainage (amount, consistency, odor)  Small amt blood tinged drainage in cannister Periwound: Intact skin surrounding wound Dressing procedure/placement/frequency: Applied 2 pieces black foam to 75 cm cont suction. Pt tolerated with mod amt pain after meds given. Plan for bedside nurse to change Q M/W/F. Please re-consult if further assistance is needed.  Thank-you,  Julien Girt MSN, [place], [place], [place], [place]

## 2014-05-26 NOTE — Progress Notes (Addendum)
[facility]  Date of Admission:  05/23/2014  Antibiotics: oritavancin x 1 dose Azithromycin, doxycycline  Subjective: Complaining of not enough pain medicine, wants to go to another hospital, nausea, pain; denies diarrhea  Objective: Temp:  [97.9 F (36.6 C)-101 F (38.3 C)] 101 F (38.3 C) (04/18 0545) Pulse Rate:  [57-93] 93 (04/18 0545) Resp:  [10-22] 16 (04/18 0545) BP: (89-125)/(41-57) 116/48 mmHg (04/18 0545) SpO2:  [95 %-100 %] 95 % (04/18 0545)  General: awake, alert, nad Skin: no rashes Lungs: CTA B Cor: RRR Abdomen: soft, nt, nd Ext: left leg with VAC in place, drain, minimal surrounding erythema  Lab Results Lab Results  Component Value Date   WBC 10.7* 05/26/2014   HGB 9.6* 05/26/2014   HCT 30.0* 05/26/2014   MCV 92.3 05/26/2014   PLT 226 05/26/2014    Lab Results  Component Value Date   CREATININE 0.93 05/24/2014   BUN 10 05/24/2014   NA 136 05/24/2014   K 4.3 05/24/2014   CL 96 05/24/2014   CO2 26 05/24/2014    Lab Results  Component Value Date   ALT 12 05/24/2014   AST 16 05/24/2014   ALKPHOS 76 05/24/2014   BILITOT 1.0 05/24/2014      Microbiology: Recent Results (from the past 240 hour(s))  Culture, blood (routine x 2)     Status: None (Preliminary result)   Collection Time: 05/23/14  6:32 AM  Result Value Ref Range Status   Specimen Description BLOOD RIGHT HAND  Final   Special Requests BOTTLES DRAWN AEROBIC AND ANAEROBIC 8CCS  Final   Culture   Final           BLOOD CULTURE RECEIVED NO GROWTH TO DATE CULTURE WILL BE HELD FOR 5 DAYS BEFORE ISSUING A FINAL NEGATIVE REPORT Performed at [facility]    Report Status PENDING  Incomplete  Culture, blood (routine x 2)     Status: None (Preliminary result)   Collection Time: 05/23/14  8:18 AM  Result Value Ref Range Status   Specimen Description BLOOD LEFT ANTECUBITAL  Final   Special Requests BOTTLES DRAWN AEROBIC ONLY 5CC  Final   Culture   Final            BLOOD CULTURE RECEIVED NO GROWTH TO DATE CULTURE WILL BE HELD FOR 5 DAYS BEFORE ISSUING A FINAL NEGATIVE REPORT Performed at [facility]    Report Status PENDING  Incomplete  Surgical pcr screen     Status: Abnormal   Collection Time: 05/23/14  3:33 PM  Result Value Ref Range Status   MRSA, PCR POSITIVE (A) NEGATIVE Final   Staphylococcus aureus POSITIVE (A) NEGATIVE Final    Comment:        The Xpert SA Assay (FDA approved for NASAL specimens in patients over 64 years of age), is one component of a comprehensive surveillance program.  Test performance has been validated by [facility] for patients greater than or equal to 65 year old. It is not intended to diagnose infection nor to guide or monitor treatment.   Anaerobic culture     Status: None (Preliminary result)   Collection Time: 05/23/14  7:36 PM  Result Value Ref Range Status   Specimen Description ABSCESS THIGH LEFT  Final   Special Requests PATIENT ON FOLLOWING DOXYCYCLINE, ZITHROMAX  Final   Gram Stain PENDING  Incomplete   Culture   Final    NO ANAEROBES ISOLATED; CULTURE IN PROGRESS FOR 5  DAYS Performed at [facility]    Report Status PENDING  Incomplete  Culture, routine-abscess     Status: None (Preliminary result)   Collection Time: 05/23/14  7:36 PM  Result Value Ref Range Status   Specimen Description ABSCESS THIGH LEFT  Final   Special Requests NONE  Final   Gram Stain PENDING  Incomplete   Culture   Final    MODERATE METHICILLIN RESISTANT STAPHYLOCOCCUS AUREUS Note: RIFAMPIN AND GENTAMICIN SHOULD NOT BE USED AS SINGLE DRUGS FOR TREATMENT OF STAPH INFECTIONS. CRITICAL RESULT CALLED TO, READ BACK BY AND VERIFIED WITH:  DAPHINE JOHNSON RN @0850  05/26/14 BY KENDR Performed at [facility]    Report Status PENDING  Incomplete   Organism ID, Bacteria METHICILLIN RESISTANT STAPHYLOCOCCUS AUREUS  Final      Susceptibility   Methicillin resistant staphylococcus aureus -  MIC*    CLINDAMYCIN >=8 RESISTANT Resistant     ERYTHROMYCIN >=8 RESISTANT Resistant     GENTAMICIN <=0.5 SENSITIVE Sensitive     LEVOFLOXACIN >=8 RESISTANT Resistant     OXACILLIN >=4 RESISTANT Resistant     PENICILLIN >=0.5 RESISTANT Resistant     RIFAMPIN <=0.5 SENSITIVE Sensitive     TRIMETH/SULFA <=10 SENSITIVE Sensitive     VANCOMYCIN 1 SENSITIVE Sensitive     TETRACYCLINE >=16 RESISTANT Resistant     * MODERATE METHICILLIN RESISTANT STAPHYLOCOCCUS AUREUS  Fungus Culture with Smear     Status: None (Preliminary result)   Collection Time: 05/23/14  7:36 PM  Result Value Ref Range Status   Specimen Description ABSCESS THIGH LEFT  Final   Special Requests PATIENT ON FOLLOWING DOXYCYCLINE, ZITHROMAX  Final   Fungal Smear   Final    NO YEAST OR FUNGAL ELEMENTS SEEN Performed at [facility]    Culture   Final    CULTURE IN PROGRESS FOR FOUR WEEKS Performed at [facility]    Report Status PENDING  Incomplete  AFB culture with smear     Status: None (Preliminary result)   Collection Time: 05/23/14  7:36 PM  Result Value Ref Range Status   Specimen Description ABSCESS THIGH LEFT  Final   Special Requests PATIENT ON FOLLOWING DOXYCYCLINE, ZITHROMAX  Final   Acid Fast Smear   Final    NO ACID FAST BACILLI SEEN Performed at [facility]    Culture   Final    CULTURE WILL BE EXAMINED FOR 6 WEEKS BEFORE ISSUING A FINAL REPORT Performed at [facility]    Report Status PENDING  Incomplete  Tissue culture     Status: None (Preliminary result)   Collection Time: 05/23/14  7:45 PM  Result Value Ref Range Status   Specimen Description TISSUE THIGH LEFT  Final   Special Requests PATIENT ON FOLLOWING DOXYCYCLINE, ZITHROMAX  Final   Gram Stain PENDING  Incomplete   Culture   Final    NO GROWTH 2 DAYS Performed at [facility]    Report Status PENDING  Incomplete  Anaerobic culture     Status: None (Preliminary result)   Collection Time:  05/23/14  7:45 PM  Result Value Ref Range Status   Specimen Description TISSUE THIGH LEFT  Final   Special Requests PATIENT ON FOLLOWING ZITHROMAX, DOXYCYCLINE  Final   Gram Stain PENDING  Incomplete   Culture   Final    NO ANAEROBES ISOLATED; CULTURE IN PROGRESS FOR 5 DAYS Performed at [facility]    Report Status PENDING  Incomplete  Fungus Culture with Smear     Status: None (Preliminary result)   Collection Time: 05/23/14  7:45 PM  Result Value Ref Range Status   Specimen Description TISSUE THIGH LEFT  Final   Special Requests PATIENT ON FOLLOWING ZITHROMAX, DOXYCYCLINE  Final   Fungal Smear   Final    NO YEAST OR FUNGAL ELEMENTS SEEN Performed at [facility]    Culture   Final    CULTURE IN PROGRESS FOR FOUR WEEKS Performed at [facility]    Report Status PENDING  Incomplete  AFB culture with smear     Status: None (Preliminary result)   Collection Time: 05/23/14  7:45 PM  Result Value Ref Range Status   Specimen Description TISSUE THIGH LEFT  Final   Special Requests PATIENT ON FOLLOWING ZITHROMAX, DOXYCYCLINE  Final   Acid Fast Smear   Final    NO ACID FAST BACILLI SEEN Performed at [facility]    Culture   Final    CULTURE WILL BE EXAMINED FOR 6 WEEKS BEFORE ISSUING A FINAL REPORT Performed at [facility]    Report Status PENDING  Incomplete  Culture, blood (routine x 2)     Status: None (Preliminary result)   Collection Time: 05/25/14  9:10 AM  Result Value Ref Range Status   Specimen Description BLOOD HAND RIGHT  Final   Special Requests BOTTLES DRAWN AEROBIC ONLY 3CC  Final   Culture   Final           BLOOD CULTURE RECEIVED NO GROWTH TO DATE CULTURE WILL BE HELD FOR 5 DAYS BEFORE ISSUING A FINAL NEGATIVE REPORT Performed at [facility]    Report Status PENDING  Incomplete  Culture, blood (routine x 2)     Status: None (Preliminary result)   Collection Time: 05/25/14  9:22 AM  Result Value Ref Range  Status   Specimen Description BLOOD HAND LEFT  Final   Special Requests BOTTLES DRAWN AEROBIC ONLY 4CC  Final   Culture   Final           BLOOD CULTURE RECEIVED NO GROWTH TO DATE CULTURE WILL BE HELD FOR 5 DAYS BEFORE ISSUING A FINAL NEGATIVE REPORT Performed at [facility]    Report Status PENDING  Incomplete    Studies/Results: Dg Chest Port 1 View  05/25/2014   CLINICAL DATA:  Fever and altered mental status. History of MRSA and a thigh abscess.  EXAM: PORTABLE CHEST - 1 VIEW  COMPARISON:  01/16/2014  FINDINGS: The heart size and mediastinal contours are within normal limits. Both lungs are clear. No pleural effusion or pneumothorax. The visualized skeletal structures are unremarkable.  IMPRESSION: No active disease.   Electronically Signed   By: Lajean Manes M.D.   On: 05/25/2014 12:49    Assessment/Plan:  1) leg abscess - MRSA positive, received Oritavancin which has good MRSA coverage.  Also will be on doxycycline and is sensitive.  S/p OR on 4/15.    2) M fortuitum infection - prior to current abscess, she did have a wound with this.  CUrrent cultures for AFB no growth to date.  She reports difficulty with taking oral antibiotics but takes other oral medication fine and takes antibiotics in house with significant vomiting so no indication for long-term IV antibiotics.  Concerns also noted of line manipulation, drug use, wound manipulation,  no home health agencies to manage but regardless, there is no reason to use IV antibiotics so I  will change her to oral medication now and she can be observed on it.  She states after '4 days' is when she begins to vomit and not tolerate.  She was observed for 2 days in previous hospitalization.  Could consider observing for 4-5 days but if she is tolerating now, there is no reason she would not later.   -I discussed with her that I will change to oral medication now, she voiced her displeasure but agreed  3) fever - not unexpected with  wound, no signs of other infection  Scharlene Gloss, [facility] [place] [email or web address] [phone] pager   [phone] cell 05/26/2014, 8:57 AM

## 2014-05-26 NOTE — Progress Notes (Signed)
Physical Therapy Treatment Patient Details Name: [name] Carr MRN: [id number] DOB: [birth year] Today's Date: 05/26/2014    History of Present Illness [name] Carr is a 59 y.o. female with x of personality disorder, breast cancer SLE, prior history of manipulating central lines, either secondary gain +/- Munchausen's who has had thigh abscess with M fortuitum and more recently MRSA. s/p IRRIGATION AND DEBRIDEMENT EXTREMITY (Left) , with application of wound vac.    PT Comments    Pt con't to be impulsive with decreased safety awareness in addition to anxiety and inability to tolerate much WBing through L LE. Pt refused ambulation again today due to nausea and "I'm not going to throw up all over the floor." Due to above deficits pt unsafe to return home alone and would benefit from ST-SNF to achieve safe mod I level of function.   Follow Up Recommendations  SNF;Supervision/Assistance - 24 hour     Equipment Recommendations       Recommendations for Other Services       Precautions / Restrictions Precautions Precautions: Fall Precaution Comments: L thigh wound vac Restrictions Weight Bearing Restrictions: Yes LLE Weight Bearing: Weight bearing as tolerated    Mobility  Bed Mobility Overal bed mobility: Needs Assistance Bed Mobility: Supine to Sit;Sit to Supine     Supine to sit: Supervision Sit to supine: Supervision   General bed mobility comments: HOB elevated, definite use of bed rail, assist to manage wound vac cord has pt unaware and didn't show conern about managing lines  Transfers Overall transfer level: Needs assistance Equipment used: None Transfers: Sit to/from [employer] Transfers Sit to Stand: Supervision Stand pivot transfers: Supervision       General transfer comment: supervision for safey, min A for line management only. Pt only WBing through L toes.   Ambulation/Gait             General Gait Details: refused to ambulate due to  nausea. pt then agreed and then MD came in so pt wanted to return to bed and talk to MD   Stairs            Wheelchair Mobility    Modified Rankin (Stroke Patients Only)       Balance Overall balance assessment: Needs assistance   Sitting balance-Leahy Scale: Fair     Standing balance support: Single extremity supported Standing balance-Leahy Scale: Poor Standing balance comment: needed to hold onto [facility] to perform hygiene                    Cognition Arousal/Alertness: Awake/alert Behavior During Therapy: Agitated;Anxious;Impulsive (emotional) Overall Cognitive Status: No family/caregiver present to determine baseline cognitive functioning                      Exercises      General Comments        Pertinent Vitals/Pain Pain Assessment: 0-10 Pain Score: 8  Pain Location: Lt thigh Pain Descriptors / Indicators: Aching;Constant Pain Intervention(s): Monitored during session    Home Living                      Prior Function            PT Goals (current goals can now be found in the care plan section) Acute Rehab PT Goals Patient Stated Goal: none stated Progress towards PT goals: Progressing toward goals    Frequency  Min 3X/week    PT Plan Current plan  remains appropriate    Co-evaluation             End of Session   Activity Tolerance:  (limited by pt's refusal to progress amb) Patient left: in bed;with bed alarm set;with call bell/phone within reach (MD present)     Time: 3912-2583 PT Time Calculation (min) (ACUTE ONLY): 18 min  Charges:  $Therapeutic Activity: 8-22 mins                    G Codes:      Kingsley Callander 05/26/2014, 10:19 AM   Kittie Plater, PT, DPT Pager #: [phone] Office #: [phone]

## 2014-05-27 LAB — GLUCOSE, CAPILLARY
Glucose-Capillary: 87 mg/dL (ref 70–99)
Glucose-Capillary: 89 mg/dL (ref 70–99)
Glucose-Capillary: 155 mg/dL — ABNORMAL HIGH (ref 70–99)
Glucose-Capillary: 118 mg/dL — ABNORMAL HIGH (ref 70–99)
Glucose-Capillary: 113 mg/dL — ABNORMAL HIGH (ref 70–99)
Glucose-Capillary: 140 mg/dL — ABNORMAL HIGH (ref 70–99)

## 2014-05-27 LAB — CBC WITH DIFFERENTIAL/PLATELET
BASOS PCT: 0 % (ref 0–1)
Basophils Absolute: 0 10*3/uL (ref 0.0–0.1)
EOS PCT: 1 % (ref 0–5)
Eosinophils Absolute: 0.1 10*3/uL (ref 0.0–0.7)
HCT: 27.4 % — ABNORMAL LOW (ref 36.0–46.0)
HEMOGLOBIN: 8.7 g/dL — AB (ref 12.0–15.0)
LYMPHS PCT: 14 % (ref 12–46)
Lymphs Abs: 1 10*3/uL (ref 0.7–4.0)
MCH: 29.7 pg (ref 26.0–34.0)
MCHC: 31.8 g/dL (ref 30.0–36.0)
MCV: 93.5 fL (ref 78.0–100.0)
MONO ABS: 0.8 10*3/uL (ref 0.1–1.0)
Monocytes Relative: 11 % (ref 3–12)
NEUTROS PCT: 74 % (ref 43–77)
Neutro Abs: 5.4 10*3/uL (ref 1.7–7.7)
Platelets: 214 10*3/uL (ref 150–400)
RBC: 2.93 MIL/uL — ABNORMAL LOW (ref 3.87–5.11)
RDW: 15.4 % (ref 11.5–15.5)
WBC: 7.3 10*3/uL (ref 4.0–10.5)

## 2014-05-27 LAB — URINE CULTURE
Colony Count: NO GROWTH
Culture: NO GROWTH

## 2014-05-27 LAB — TISSUE CULTURE
CULTURE: NO GROWTH
Gram Stain: NONE SEEN

## 2014-05-27 MED ORDER — INSULIN ASPART 100 UNIT/ML ~~LOC~~ SOLN
0.0000 [IU] | Freq: Three times a day (TID) | SUBCUTANEOUS | Status: DC
  Administered 2014-05-28 (×3): 1 [IU] via SUBCUTANEOUS

## 2014-05-27 MED ORDER — ROPINIROLE HCL 1 MG PO TABS
1.0000 mg | ORAL_TABLET | Freq: Every day | ORAL | Status: DC
  Administered 2014-05-27 – 2014-05-28 (×2): 1 mg via ORAL
  Filled 2014-05-27 (×2): qty 1

## 2014-05-27 NOTE — Progress Notes (Signed)
[facility] PROGRESS NOTE  DESTENEE GUERRY IOX:[id number] DOB: 1955/09/23 DOA: 05/23/2014 PCP: [name], MD Summary 59 year old white female with history of left thigh abscess requiring I and D, recent cultures positive for MRSA and mycobacteria fortuitum. Return with worsening pain and CAT scan showing abscess. Orthopedics and infectious disease consulted. Status post I and D by Dr. Marlou Sa on 05/23/2014.  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  Assessment/Plan:    Thigh abscess:  Status post irrigation and debridement, application of wound VAC by Dr. Marlou Sa on 4/15. PT recommending skilled nursing facility-  Patient received Oritavancin 4/16 which will cover MRSA for 2-4 weeks. Cultures pending. Currently getting IV azithromycin and doxycycline. Patient reports that she can only tolerate "4 days of any oral antibiotic" before vomiting starts. Agree with the plan to transition to oral while in-house. PICC line in this woman is not a good idea.    AFB and bacterial cultures pending.  Somnolence, suspect drug misuse in hospital:   -4/18: unknown tablet was found in her bed which does not match the medications she has been prescribed. Nurses searched patient's belongings and she had multiple tablets in a pill organizer which were sent to pharmacy.    Fever: Repeat blood cultures drawn. Urinalysis ordered and urine culture. Chest x-ray shows no infiltrate.    SLE-on prednisone-Follow at [employer] behavior with medical non-compliance     Anemia:  Will monitor  Code Status:  full Family Communication:  Patient Disposition Plan:  SNF vs home- d/c in AM  Consultants:  Orthopedics, Dr. Marlou Sa  Infectious disease  Procedures:   Left thigh I and D, wound VAC placement  Antibiotics: oritavancin 4/16 Doxy, azthro 4/15 -   HPI/Subjective: Patient refusing to wake up and  talk about d/c planning Denies pain  Objective: Filed Vitals:   05/27/14 0653  BP: 124/55  Pulse: 82  Temp: 99.5 F (37.5 C)  Resp: 19   No intake or output data in the 24 hours ending 05/27/14 1309 Filed Weights   05/23/14 0827  Weight: 85.7 kg (188 lb 15 oz)    Exam:   General:  Sleeping but will awaken and say "honey- I'm just catching up on my sleep"  Cardiovascular: Regular rate rhythm without murmurs gallops rubs  Respiratory:  Clear to auscultation bilaterally without wheezes rhonchi or rales   Ext: . Wound VAC on left thigh  Basic Metabolic Panel:  Recent Labs Lab 05/23/14 0016 05/23/14 2683 05/23/14 0639 05/24/14 0704 05/26/14 0757  NA 139  --   --  136 135  K 3.3*  --   --  4.3 3.7  CL 100  --   --  96 94*  CO2 27  --   --  26 26  GLUCOSE 117*  --   --  98 115*  BUN 19  --   --  10 14  CREATININE 1.14* 1.08  --  0.93 1.00  CALCIUM 9.3  --   --  8.3* 8.6  MG  --   --  1.9  --   --    Liver Function Tests:  Recent Labs Lab 05/24/14 0704  AST 16  ALT 12  ALKPHOS 76  BILITOT 1.0  PROT 5.3*  ALBUMIN 2.8*   No results for input(s): LIPASE, AMYLASE in the last 168 hours. No results for input(s): AMMONIA in the last 168 hours. CBC:  Recent Labs Lab  05/23/14 0016 05/23/14 1540 05/24/14 0704 05/25/14 0922 05/26/14 0757 05/27/14 0800  WBC 9.7 8.0 7.4 10.5 10.7* 7.3  NEUTROABS 7.5  --  5.1 8.9* 7.7 5.4  HGB 10.6* 9.7* 8.9* 9.4* 9.6* 8.7*  HCT 34.3* 31.0* 28.8* 29.5* 30.0* 27.4*  MCV 94.8 95.4 95.7 93.4 92.3 93.5  PLT 242 204 207 148* 226 214   Cardiac Enzymes: No results for input(s): CKTOTAL, CKMB, CKMBINDEX, TROPONINI in the last 168 hours. BNP (last 3 results) No results for input(s): BNP in the last 8760 hours.  ProBNP (last 3 results) No results for input(s): PROBNP in the last 8760 hours.  CBG:  Recent Labs Lab 05/26/14 1629 05/26/14 2246 05/27/14 0124 05/27/14 0501 05/27/14 1141  GLUCAP 76 75 87 89 155*    Recent  Results (from the past 240 hour(s))  Culture, blood (routine x 2)     Status: None (Preliminary result)   Collection Time: 05/23/14  6:32 AM  Result Value Ref Range Status   Specimen Description BLOOD RIGHT HAND  Final   Special Requests BOTTLES DRAWN AEROBIC AND ANAEROBIC 8CCS  Final   Culture   Final           BLOOD CULTURE RECEIVED NO GROWTH TO DATE CULTURE WILL BE HELD FOR 5 DAYS BEFORE ISSUING A FINAL NEGATIVE REPORT Performed at [facility]    Report Status PENDING  Incomplete  Culture, blood (routine x 2)     Status: None (Preliminary result)   Collection Time: 05/23/14  8:18 AM  Result Value Ref Range Status   Specimen Description BLOOD LEFT ANTECUBITAL  Final   Special Requests BOTTLES DRAWN AEROBIC ONLY 5CC  Final   Culture   Final           BLOOD CULTURE RECEIVED NO GROWTH TO DATE CULTURE WILL BE HELD FOR 5 DAYS BEFORE ISSUING A FINAL NEGATIVE REPORT Performed at [facility]    Report Status PENDING  Incomplete  Surgical pcr screen     Status: Abnormal   Collection Time: 05/23/14  3:33 PM  Result Value Ref Range Status   MRSA, PCR POSITIVE (A) NEGATIVE Final   Staphylococcus aureus POSITIVE (A) NEGATIVE Final    Comment:        The Xpert SA Assay (FDA approved for NASAL specimens in patients over 58 years of age), is one component of a comprehensive surveillance program.  Test performance has been validated by [facility] for patients greater than or equal to 22 year old. It is not intended to diagnose infection nor to guide or monitor treatment.   Anaerobic culture     Status: None (Preliminary result)   Collection Time: 05/23/14  7:36 PM  Result Value Ref Range Status   Specimen Description ABSCESS THIGH LEFT  Final   Special Requests PATIENT ON FOLLOWING DOXYCYCLINE, ZITHROMAX  Final   Gram Stain   Final    RARE WBC PRESENT, PREDOMINANTLY PMN NO SQUAMOUS EPITHELIAL CELLS SEEN FEW GRAM POSITIVE COCCI IN CLUSTERS Performed at [facility]    Culture   Final    NO ANAEROBES ISOLATED; CULTURE IN PROGRESS FOR 5 DAYS Performed at [facility]    Report Status PENDING  Incomplete  Culture, routine-abscess     Status: None   Collection Time: 05/23/14  7:36 PM  Result Value Ref Range Status   Specimen Description ABSCESS THIGH LEFT  Final   Special Requests NONE  Final   Gram Stain   Final  RARE WBC PRESENT, PREDOMINANTLY PMN NO SQUAMOUS EPITHELIAL CELLS SEEN FEW GRAM POSITIVE COCCI IN CLUSTERS Performed at [facility]    Culture   Final    MODERATE METHICILLIN RESISTANT STAPHYLOCOCCUS AUREUS Note: RIFAMPIN AND GENTAMICIN SHOULD NOT BE USED AS SINGLE DRUGS FOR TREATMENT OF STAPH INFECTIONS. CRITICAL RESULT CALLED TO, READ BACK BY AND VERIFIED WITH:  DAPHINE JOHNSON RN @0850  05/26/14 BY KENDR Performed at [facility]    Report Status 05/26/2014 FINAL  Final   Organism ID, Bacteria METHICILLIN RESISTANT STAPHYLOCOCCUS AUREUS  Final      Susceptibility   Methicillin resistant staphylococcus aureus - MIC*    CLINDAMYCIN >=8 RESISTANT Resistant     ERYTHROMYCIN >=8 RESISTANT Resistant     GENTAMICIN <=0.5 SENSITIVE Sensitive     LEVOFLOXACIN >=8 RESISTANT Resistant     OXACILLIN >=4 RESISTANT Resistant     PENICILLIN >=0.5 RESISTANT Resistant     RIFAMPIN <=0.5 SENSITIVE Sensitive     TRIMETH/SULFA <=10 SENSITIVE Sensitive     VANCOMYCIN 1 SENSITIVE Sensitive     TETRACYCLINE >=16 RESISTANT Resistant     * MODERATE METHICILLIN RESISTANT STAPHYLOCOCCUS AUREUS  Fungus Culture with Smear     Status: None (Preliminary result)   Collection Time: 05/23/14  7:36 PM  Result Value Ref Range Status   Specimen Description ABSCESS THIGH LEFT  Final   Special Requests PATIENT ON FOLLOWING DOXYCYCLINE, ZITHROMAX  Final   Fungal Smear   Final    NO YEAST OR FUNGAL ELEMENTS SEEN Performed at [facility]    Culture   Final    CULTURE IN PROGRESS FOR FOUR WEEKS Performed at [facility]    Report Status PENDING  Incomplete  AFB culture with smear     Status: None (Preliminary result)   Collection Time: 05/23/14  7:36 PM  Result Value Ref Range Status   Specimen Description ABSCESS THIGH LEFT  Final   Special Requests PATIENT ON FOLLOWING DOXYCYCLINE, ZITHROMAX  Final   Acid Fast Smear   Final    NO ACID FAST BACILLI SEEN Performed at [facility]    Culture   Final    CULTURE WILL BE EXAMINED FOR 6 WEEKS BEFORE ISSUING A FINAL REPORT Performed at [facility]    Report Status PENDING  Incomplete  Tissue culture     Status: None   Collection Time: 05/23/14  7:45 PM  Result Value Ref Range Status   Specimen Description TISSUE THIGH LEFT  Final   Special Requests PATIENT ON FOLLOWING DOXYCYCLINE, ZITHROMAX  Final   Gram Stain   Final    NO WBC SEEN NO ORGANISMS SEEN Performed at [facility]    Culture   Final    NO GROWTH 3 DAYS Performed at [facility]    Report Status 05/27/2014 FINAL  Final  Anaerobic culture     Status: None (Preliminary result)   Collection Time: 05/23/14  7:45 PM  Result Value Ref Range Status   Specimen Description TISSUE THIGH LEFT  Final   Special Requests PATIENT ON FOLLOWING ZITHROMAX, DOXYCYCLINE  Final   Gram Stain   Final    NO WBC SEEN NO ORGANISMS SEEN Performed at [facility]    Culture   Final    NO ANAEROBES ISOLATED; CULTURE IN PROGRESS FOR 5 DAYS Performed at [facility]    Report Status PENDING  Incomplete  Fungus Culture with Smear     Status: None (  Preliminary result)   Collection Time: 05/23/14  7:45 PM  Result Value Ref Range Status   Specimen Description TISSUE THIGH LEFT  Final   Special Requests PATIENT ON FOLLOWING ZITHROMAX, DOXYCYCLINE  Final   Fungal Smear   Final    NO YEAST OR FUNGAL ELEMENTS SEEN Performed at [facility]    Culture   Final    CULTURE IN PROGRESS FOR FOUR WEEKS Performed at [facility]    Report  Status PENDING  Incomplete  AFB culture with smear     Status: None (Preliminary result)   Collection Time: 05/23/14  7:45 PM  Result Value Ref Range Status   Specimen Description TISSUE THIGH LEFT  Final   Special Requests PATIENT ON FOLLOWING ZITHROMAX, DOXYCYCLINE  Final   Acid Fast Smear   Final    NO ACID FAST BACILLI SEEN Performed at [facility]    Culture   Final    CULTURE WILL BE EXAMINED FOR 6 WEEKS BEFORE ISSUING A FINAL REPORT Performed at [facility]    Report Status PENDING  Incomplete  Culture, blood (routine x 2)     Status: None (Preliminary result)   Collection Time: 05/25/14  9:10 AM  Result Value Ref Range Status   Specimen Description BLOOD HAND RIGHT  Final   Special Requests BOTTLES DRAWN AEROBIC ONLY 3CC  Final   Culture   Final           BLOOD CULTURE RECEIVED NO GROWTH TO DATE CULTURE WILL BE HELD FOR 5 DAYS BEFORE ISSUING A FINAL NEGATIVE REPORT Performed at [facility]    Report Status PENDING  Incomplete  Culture, blood (routine x 2)     Status: None (Preliminary result)   Collection Time: 05/25/14  9:22 AM  Result Value Ref Range Status   Specimen Description BLOOD HAND LEFT  Final   Special Requests BOTTLES DRAWN AEROBIC ONLY 4CC  Final   Culture   Final           BLOOD CULTURE RECEIVED NO GROWTH TO DATE CULTURE WILL BE HELD FOR 5 DAYS BEFORE ISSUING A FINAL NEGATIVE REPORT Performed at [facility]    Report Status PENDING  Incomplete  Culture, Urine     Status: None   Collection Time: 05/25/14  5:15 PM  Result Value Ref Range Status   Specimen Description URINE, CLEAN CATCH  Final   Special Requests NONE  Final   Colony Count NO GROWTH Performed at [facility]   Final   Culture NO GROWTH Performed at [facility]   Final   Report Status 05/27/2014 FINAL  Final     Studies: No results found.  Scheduled Meds: . azithromycin  500 mg Oral Daily  . docusate sodium  200 mg Oral BID   . doxycycline  100 mg Oral Q12H  . DULoxetine  60 mg Oral BID  . heparin  5,000 Units Subcutaneous 3 times per day  .  HYDROmorphone (DILAUDID) injection  1 mg Intravenous Once per day on Mon Wed Fri  . insulin aspart  0-9 Units Subcutaneous 6 times per day  . pantoprazole  20 mg Oral Daily  . predniSONE  10 mg Oral Q breakfast  . rOPINIRole  1 mg Oral QHS   Continuous Infusions: . sodium chloride Stopped (05/24/14 1917)    Time spent: 25 minutes  Eulogio Bear  [facility] [phone]  [email or web address], password [facility] 05/27/2014, 1:09 PM  LOS:  4 days

## 2014-05-27 NOTE — Clinical Social Work Note (Signed)
Clinical Social Worker attempted to meet with patient again at bedside and pt is pretending to be asleep as CSW initiates assessment.   Patient refusing SNF placement and will plan to return home with Home Health.   Clinical Social Worker will sign off for now as social work intervention is no longer needed. Please consult [place] again if new need arises.   Glendon Axe, MSW, LCSWA [phone] 05/27/2014 2:32 PM

## 2014-05-27 NOTE — Progress Notes (Signed)
CARE MANAGEMENT NOTE 05/27/2014  Patient:  [name] Carr,[name] Carr   Account Number:  [id number]  Date Initiated:  05/23/2014  Documentation initiated by:  ,  Subjective/Objective Assessment:   Patient was admitted with recurrent thigh abscess. Recently discharged home on PO antibiotics following a surgical debridement. Patient is active with [facility] for HH RN/CSW     Action/Plan:   Will follow for discharge needs pending PT/OT evals and physician orders.   Anticipated DC Date:     Anticipated DC Plan:  HOME W HOME HEALTH SERVICES      DC Planning Services  CM consult      Choice offered to / List presented to:             Status of service:  In process, will continue to follow Medicare Important Message given?  YES (If response is "NO", the following Medicare IM given date fields will be blank) Date Medicare IM given:  05/27/2014 Medicare IM given by:  , Date Additional Medicare IM given:   Additional Medicare IM given by:    Discharge Disposition:    Per UR Regulation:  Reviewed for med. necessity/level of care/duration of stay  If discussed at Long Length of Stay Meetings, dates discussed:    Comments:  05/27/14 1000   RN, MSN, CM- Wound Vac script placed on patient's shadow chart for physician to complete in anticipation of need for Vac at discharge.  Bedside RN notified of need for physician signature.  Awaiting final decision on discharge disposition.   05/23/14 0950   RN, MSN, CM- Met with patient to verify which company she is using for home health services.  Per patient, she is active with [facility]. Mary with [facility] was notified of patient's admission and will continue to follow for discharge disposition.   

## 2014-05-27 NOTE — Progress Notes (Signed)
UR complete.  Janylah Belgrave RN, MSN 

## 2014-05-28 DIAGNOSIS — Z765 Malingerer [conscious simulation]: Secondary | ICD-10-CM

## 2014-05-28 LAB — GLUCOSE, CAPILLARY
GLUCOSE-CAPILLARY: 124 mg/dL — AB (ref 70–99)
Glucose-Capillary: 127 mg/dL — ABNORMAL HIGH (ref 70–99)
Glucose-Capillary: 147 mg/dL — ABNORMAL HIGH (ref 70–99)
Glucose-Capillary: 148 mg/dL — ABNORMAL HIGH (ref 70–99)

## 2014-05-28 LAB — BASIC METABOLIC PANEL
Anion gap: 11 (ref 5–15)
BUN: 11 mg/dL (ref 6–23)
CO2: 27 mmol/L (ref 19–32)
Calcium: 8.7 mg/dL (ref 8.4–10.5)
Chloride: 99 mmol/L (ref 96–112)
Creatinine, Ser: 0.97 mg/dL (ref 0.50–1.10)
GFR calc Af Amer: 73 mL/min — ABNORMAL LOW (ref >90)
GFR, EST NON AFRICAN AMERICAN: 63 mL/min — AB (ref >90)
Glucose, Bld: 124 mg/dL — ABNORMAL HIGH (ref 70–99)
Potassium: 3.5 mmol/L (ref 3.5–5.1)
SODIUM: 137 mmol/L (ref 135–145)

## 2014-05-28 LAB — CBC WITH DIFFERENTIAL/PLATELET
Basophils Absolute: 0 10*3/uL (ref 0.0–0.1)
Basophils Relative: 0 % (ref 0–1)
EOS ABS: 0.1 10*3/uL (ref 0.0–0.7)
Eosinophils Relative: 1 % (ref 0–5)
HEMATOCRIT: 30.5 % — AB (ref 36.0–46.0)
Hemoglobin: 9.5 g/dL — ABNORMAL LOW (ref 12.0–15.0)
Lymphocytes Relative: 16 % (ref 12–46)
Lymphs Abs: 1.5 10*3/uL (ref 0.7–4.0)
MCH: 29.1 pg (ref 26.0–34.0)
MCHC: 31.1 g/dL (ref 30.0–36.0)
MCV: 93.6 fL (ref 78.0–100.0)
Monocytes Absolute: 1 10*3/uL (ref 0.1–1.0)
Monocytes Relative: 10 % (ref 3–12)
Neutro Abs: 6.9 10*3/uL (ref 1.7–7.7)
Neutrophils Relative %: 73 % (ref 43–77)
Platelets: 259 10*3/uL (ref 150–400)
RBC: 3.26 MIL/uL — ABNORMAL LOW (ref 3.87–5.11)
RDW: 15.4 % (ref 11.5–15.5)
WBC: 9.4 10*3/uL (ref 4.0–10.5)

## 2014-05-28 MED ORDER — DOXYCYCLINE HYCLATE 100 MG PO TABS: 100.0000 mg | ORAL_TABLET | Freq: Two times a day (BID) | ORAL | Status: DC

## 2014-05-28 MED ORDER — CHLORHEXIDINE GLUCONATE CLOTH 2 % EX PADS
6.0000 | MEDICATED_PAD | Freq: Every day | CUTANEOUS | Status: DC
  Administered 2014-05-29: 6 via TOPICAL

## 2014-05-28 MED ORDER — TIZANIDINE HCL 2 MG PO TABS
2.0000 mg | ORAL_TABLET | Freq: Four times a day (QID) | ORAL | Status: DC | PRN
  Administered 2014-05-28 – 2014-05-29 (×3): 2 mg via ORAL
  Filled 2014-05-28 (×4): qty 1

## 2014-05-28 MED ORDER — AZITHROMYCIN 500 MG PO TABS: 500.0000 mg | ORAL_TABLET | Freq: Every day | ORAL | Status: DC

## 2014-05-28 MED ORDER — ONDANSETRON HCL 4 MG PO TABS: 4.0000 mg | ORAL_TABLET | Freq: Four times a day (QID) | ORAL | Status: DC | PRN

## 2014-05-28 MED ORDER — MUPIROCIN 2 % EX OINT
1.0000 "application " | TOPICAL_OINTMENT | Freq: Two times a day (BID) | CUTANEOUS | Status: DC
  Administered 2014-05-29: 1 via NASAL
  Filled 2014-05-28: qty 22

## 2014-05-28 NOTE — Progress Notes (Signed)
CARE MANAGEMENT NOTE 05/28/2014  Patient:  [name] Carr, [name] Carr   Account Number:  [id number]  Date Initiated:  05/23/2014  Documentation initiated by:  Lorne Skeens  Subjective/Objective Assessment:   Patient was admitted with recurrent thigh abscess. Recently discharged home on PO antibiotics following a surgical debridement. Patient is active with [facility] for [facility] RN/CSW     Action/Plan:   Will follow for discharge needs pending PT/OT evals and physician orders.   Anticipated DC Date:  05/28/2014   Anticipated DC Plan:  [place]  CM consult      Choice offered to / List presented to:  C-1 Patient        [place] arranged  HH-1 RN  [place] agency  [place]   Status of service:  In process, will continue to follow Medicare Important Message given?  YES (If response is "NO", the following Medicare IM given date fields will be blank) Date Medicare IM given:  05/27/2014 Medicare IM given by:  Lorne Skeens Date Additional Medicare IM given:   Additional Medicare IM given by:    Discharge Disposition:  [place]  Per UR Regulation:  Reviewed for med. necessity/level of care/duration of stay  If discussed at [place] of Stay Meetings, dates discussed:    Comments:  05/28/14  [place], MSN, CM- Spoke with [facility] with [facility] to inform of [facility] services and wound care that was ordered.  CSW notified that patient will require assistance with transportation home after the arrival of the wound vac.  CM spoke with Rickie with [facility], who is aware that patient has a discharge order in place at this time and is ready for discharge home once the vac has arrived. Bedside RN is aware.   05/28/14 0930 Lorne Skeens RN, MSN, CM- Signed wound vac prescription faxed to [facility] with [facility].  Message left for [facility] with [place] to provide updates on discharge  disposition.   05/27/14 [place], MSN, CM- Met with patient to verify that the discharge dispostion will be home with home health.  Per attending MD, plan is to discharge tomorrow 05/28/14.  CM spoke with Abigail Butts at Dr Randel Pigg office to notify that wound vac script is on the shadow chart and needs signed.  Spoke with Tomasita Morrow at [facility] regarding need for Vac and requested information faxed.  Will fax signed script once available.  CSW aware.   05/27/14 [place], MSN, CM- Wound Vac script placed on patient's shadow chart for physician to complete in anticipation of need for Vac at discharge.  Bedside RN notified of need for physician signature.  Awaiting final decision on discharge disposition.   05/23/14 [place], MSN, CM- Met with patient to verify which company she is using for home health services.  Per patient, she is active with [facility]. Mary with [facility] was notified of patient's admission and will continue to follow for discharge disposition.

## 2014-05-28 NOTE — Consult Note (Signed)
WOC wound consult note Reason for Consult: Left thigh Vac dressing change Wound type: Full thickness post-op wound; appearance unchanged from Monday. Wound bed: Beefy red with exposed muscles Drainage (amount, consistency, odor) Mod amt blood tinged drainage in cannister Periwound: Intact skin surrounding wound Dressing procedure/placement/frequency: Applied 1 piece black foam to 75 cm cont suction. Pt tolerated with mod amt pain after meds given. Plan for nurse to change Q M/W/F. Please re-consult if further assistance is needed. Thank-you,  Julien Girt MSN, [place], [place], [place], [place]

## 2014-05-28 NOTE — Progress Notes (Signed)
Ricky with [facility] [phone]) called and explained that there is an issue with the order for the patient's wound vac. She says that there is an issue with the prescription not allowing them complete the ordering process and she will have to start over tomorrow morning with a completely new prescription. Pt's discharge orders are written for this evening but this issue will delay her going home on time. Pt is concerned she will have to pay for this evening's hospital stay if insurance does not cover it. MD paged. Kamarian Sahakian, [name] Brunt, RN

## 2014-05-28 NOTE — Progress Notes (Signed)
PT Cancellation Note  Patient Details Name: [name] Carr MRN: [id number] DOB: 04-Jun-1955   Cancelled Treatment:    Reason Eval/Treat Not Completed: Patient declined, no reason specified Patient declines working with therapy despite encouragement and educating on importance of working with therapy. States she is weak and needs help, especially if she is going home but continues to decline working with PT. Offered to assist her into chair but states she does not want to increase her pain in LLE. Will follow up as time/schedule allows.  Ellouise Newer 05/28/2014, 12:06 PM Elayne Snare, [place]

## 2014-05-28 NOTE — Progress Notes (Signed)
Patient upset about possible discharge this evening. Stating that she won't be able to get her prescriptions this evening. Pt refusing bed alarm at this time. Continuing to monitor closely. Riddick Nuon, Rande Brunt, RN

## 2014-05-28 NOTE — Discharge Summary (Addendum)
Physician Discharge Summary  [name] Carr AOZ:[id number] DOB: October 05, 1955 DOA: 05/23/2014  PCP: [name] Pont, MD  Admit date: 05/23/2014 Discharge date: 05/28/2014  Time spent: 35 minutes  Recommendations for Outpatient Follow-up:  1. Would suggest monitoring controlled substances 2. NO PICC LINE 3. abx until at least 5/6- need to follow in the ID clinic 4. Home health- RN/social work/PT (refused PT here)  Discharge Diagnoses:  Principal Problem:   Thigh abscess Active Problems:   SLE-on prednisone-Follow at [facility]   Drug-seeking behavior   Abscess   MRSA infection   Munchausen syndrome   Malingering   Mixed personality disorder   Anemia   FUO (fever of unknown origin)   Somnolence   Hypotension   Fever   Discharge Condition: improved  Diet recommendation: cardiac  Filed Weights   05/23/14 0827  Weight: 85.7 kg (188 lb 15 oz)    History of present illness:  This is a 59 y.o. year old female with significant past medical history of multiple medical problems including chronic pain, anxiety, depression, SLE on prednisone chronically, surgical wound infection, mycobacterium fortuitum infection, type 2 DM presenting with recurrent thigh abscess. Pt noted to have had recurrent thigh abscess since 12/2013. Has had multiple surgical operations and failed outpt management. Noted wound cultures that grew out mycobacterium fortuitum. Last admission 3/31-4/6 with noted surgical debridement. Most recent cultures 4/1 notable for rare staph aureus/MRSA on cx. Discharged on oral azithromycin and doxy. Mixed sensitivities w/ Resistance to erythomycin and sensitive to tetracycline among others. Please see cx sensitivies.  Pt states that she has been unable to tolerate oral abx at home. Was only able to tolerate 1-2 pills on day of discharge. States that she has nausea and emesis everytime she takes abx. Has had worsening R anterior thigh swelling and pain. + subjective chills. Pt  adamantly denies any hx/o narcotic abuse/PICC line misuse. Pt and associate report " that is a lie!" Presented to ER afebrile, hemodynamicallly stable. CBC and CMET grossly WNL. K 3.3, Cr 1.14. CT femur Anterior medial fluid collection in the soft tissues. In craniocaudal dimension, most consistent with soft tissue abscess. Smaller ovoid fluid collection or distally measures 2.4 cm, may be in continuity. EDPA spoke w/ on call for [facility]. Medical admission. Oral abx convert to IV.   Hospital Course:  Thigh abscess: Status post irrigation and debridement, application of wound VAC by Dr. Marlou Sa on 4/15. PT recommending skilled nursing facility-but patient refused and also refused to walk or work with PT Patient received Oritavancin 4/16 which will cover MRSA for 2-4 weeks. Cultures MRSA.   transition to oral while in-house and patient has tolerated just fine PICC line in this woman is not a good idea.  -will need home health for vac management  -ID follow up  Somnolence, suspect drug misuse in hospital:  -4/17: unknown tablet was found in her bed which does not match the medications she has been prescribed. Nurses searched patient's belongings and she had multiple tablets in a pill organizer which were sent to pharmacy.   Fever: Repeat blood cultures drawn. Urinalysis ordered and urine culture. Chest x-ray shows no infiltrate.   SLE-on prednisone-Follow at [facility] [employer] behavior with medical non-compliance - patient on multiple controlled substances at home- suspect some misuse and I will not prescribe   Anemia: Will monitor  Procedures:    Consultations:  ID  ortho  Discharge Exam: Filed Vitals:   05/28/14 1024  BP: 114/50  Pulse: 80  Temp:  99.9 F (37.7 C)  Resp: 20    General: awake, anxious to get home to her dogs (Runner, broadcasting/film/video) Cardiovascular: rrr Respiratory: clear  Discharge Instructions   Discharge Instructions    Diet - low sodium  heart healthy    Complete by:  As directed      Increase activity slowly    Complete by:  As directed           Current Discharge Medication List    START taking these medications   Details  ondansetron (ZOFRAN) 4 MG tablet Take 1 tablet (4 mg total) by mouth every 6 (six) hours as needed for nausea. Qty: 20 tablet, Refills: 0      CONTINUE these medications which have CHANGED   Details  azithromycin (ZITHROMAX) 500 MG tablet Take 1 tablet (500 mg total) by mouth daily. Qty: 16 tablet, Refills: 0    doxycycline (VIBRA-TABS) 100 MG tablet Take 1 tablet (100 mg total) by mouth every 12 (twelve) hours. Qty: 32 tablet, Refills: 0      CONTINUE these medications which have NOT CHANGED   Details  acetaminophen (TYLENOL) 500 MG tablet Take 1 tablet (500 mg total) by mouth every 6 (six) hours as needed for mild pain or fever. Total daily tylenol dose from all medications should not exceed 4 gm. Please follow up with your pain MD.    clonazePAM (KLONOPIN) 1 MG tablet Take one tablet by mouth twice daily Qty: 60 tablet, Refills: 5    docusate sodium (COLACE) 100 MG capsule Take 200 mg by mouth 2 (two) times daily.     DULoxetine (CYMBALTA) 60 MG capsule Take 1 capsule (60 mg total) by mouth 2 (two) times daily. Qty: 180 capsule, Refills: 1    fentaNYL (DURAGESIC - DOSED MCG/HR) 25 MCG/HR patch Place 25 mcg onto the skin every 3 (three) days.    furosemide (LASIX) 20 MG tablet Take 20 mg by mouth every morning.    gabapentin (NEURONTIN) 800 MG tablet Take 800 mg by mouth 3 (three) times daily.    hydrochlorothiazide (HYDRODIURIL) 25 MG tablet Take 25 mg by mouth daily.     HYDROcodone-acetaminophen (NORCO) 10-325 MG per tablet Take 1 tablet by mouth every 4 (four) hours as needed for moderate pain or severe pain. Qty: 45 tablet, Refills: 0    pantoprazole (PROTONIX) 20 MG tablet Take 20 mg by mouth daily.    potassium chloride SA (K-DUR,KLOR-CON) 20 MEQ tablet 20 MEQ daily     predniSONE (DELTASONE) 5 MG tablet Take 10 mg by mouth daily with breakfast.     PRESCRIPTION MEDICATION Take 1 tablet by mouth daily. Supportive therapy [facility]    promethazine (PHENERGAN) 25 MG suppository Place 25 mg rectally every 6 (six) hours as needed for nausea or vomiting.    rOPINIRole (REQUIP) 1 MG tablet Take 1 mg by mouth at bedtime.    spironolactone (ALDACTONE) 25 MG tablet Take 25 mg by mouth daily.    tiZANidine (ZANAFLEX) 4 MG tablet Take 2-4 mg by mouth every 6 (six) hours as needed for muscle spasms.     traZODone (DESYREL) 150 MG tablet Take 150-300 mg by mouth at bedtime.    senna-docusate (SENOKOT-S) 8.6-50 MG per tablet Take 1 tablet by mouth 2 (two) times daily as needed for mild constipation.       STOP taking these medications     EPINEPHrine (EPIPEN) 0.3 mg/0.3 mL IJ SOAJ injection        Allergies  Allergen Reactions  . Ammonia Other (See Comments)    Ended up on ventilator from ammonia tabs  . Contrast Media [Iodinated Diagnostic Agents] Other (See Comments)    Doesn't breath well.  . Iohexol Other (See Comments)    SOB   . Methotrexate Anaphylaxis  . Midazolam Hcl Anaphylaxis  . Nalbuphine Other (See Comments)    Can't breathe well  . Infliximab Nausea And Vomiting  . Ketorolac Tromethamine Other (See Comments)    Injectable doesn't work and pill hurts stomach.  . Morphine And Related Hives  . Nsaids Nausea And Vomiting  . Celecoxib Rash   Follow-up Information    Follow up with PARUCHURI,VAMSEE, MD In 1 week.   Specialty:  Internal Medicine      Follow up with Michel Bickers, MD In 2 weeks.   Specialty:  Infectious Diseases   Contact information:   [address] [place] [address] [phone]        The results of significant diagnostics from this hospitalization (including imaging, microbiology, ancillary and laboratory) are listed below for reference.    Significant Diagnostic Studies: US Abdomen  Complete  05/07/2014   CLINICAL DATA:  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 ; interval negative CT scans  EXAM: ULTRASOUND ABDOMEN COMPLETE  COMPARISON:  None.  FINDINGS: Gallbladder: The gallbladder is adequately distended. There are no stones. There is no wall thickening or pericholecystic fluid. There is no positive sonographic Murphy's sign.  Common bile duct: Diameter: 2 mm  Liver: The hepatic echotexture is normal. There is a 7 mm hyperechoic structure in the right lobe without increased vascularity.  IVC: Partially obscured by bowel gas.  Pancreas: Evaluation the pancreas was limited by bowel gas.  Spleen: Size and appearance within normal limits.  Right Kidney: Length: 11 mm. Echogenicity within normal limits. No mass or hydronephrosis visualized.  Left Kidney: Length: 10.7 mm. Echogenicity within normal limits. No mass or hydronephrosis visualized.  Abdominal aorta: Evaluation of the abdominal aorta was limited by bowel gas. The proximal aorta exhibits a maximum of 2.4 cm in diameter.  Other findings: No ascites is demonstrated.  IMPRESSION: 1. There is a 7 mm hyperechoic focus in the right hepatic lobe. Statistically this is most compatible with a hemangioma. No abnormalities elsewhere in the liver are demonstrated. For further evaluation of the hepatic parenchyma a hepatic protocol MRI would be useful. 2. No acute abnormality is demonstrated elsewhere within the abdomen. Bowel gas limits the study somewhat.   Electronically Signed   By: David  Martinique   On: 05/07/2014 14:37   Ct Femur Left Wo Contrast  05/23/2014   ADDENDUM REPORT: 05/23/2014 14:19  ADDENDUM: CORRECTION: The soft tissue fluid collection described in the report above is in the ANTERIOR LATERAL aspect of the left thigh rather than in the anterior medial aspect of the thigh.   Electronically Signed   By: Lorriane Shire M.D.   On: 05/23/2014 14:19   05/23/2014   CLINICAL DATA:  Surgery 2 weeks prior to the left leg for  cellulitis. Now with recurrent cellulitis, fever, chills and bleeding in the soft tissues.  EXAM: CT OF THE LEFT FEMUR WITHOUT CONTRAST  TECHNIQUE: Multidetector CT imaging was performed according to the standard protocol. Multiplanar CT image reconstructions were also generated. No intravenous contrast due to reported allergy.  COMPARISON:  CT lower extremity 05/07/2014  FINDINGS: Progressesive/recurrent soft tissue infection with development of confluent soft tissue fluid collection in the anterior medial left thigh.  This appears elongated measuring 15 cm in cranial caudal dimension, 6.2 by 3.3 cm in transverse by AP dimension. There is associated diffuse soft tissue edema, skin thickening, and induration. This is likely in continuity with a smaller ovoid fluid collection distally measuring 3.0 x 1.2 x 2.4 cm. A few foci of air seen within the anterior thigh muscle compartment that appears similar to prior exam, however the intramuscular fluid collection is less well-defined with diffuse muscular edema. No definite air in the fluid collection. Multifocal soft tissue calcifications are seen. No osseous erosion, periosteal reaction, or bony destructive change.  IMPRESSION: Anterior medial fluid collection in the soft tissues, 15 cm in craniocaudal dimension, most consistent with soft tissue abscess. Smaller ovoid fluid collection or distally measures 2.4 cm, may be in continuity. Hematoma could have a similar appearance, but is felt less likely. There is air within the anterior thigh muscle compartment, however no definite intramuscular fluid collection, detailed evaluation is limited given lack contrast.  Electronically Signed: By: Jeb Levering M.D. On: 05/23/2014 04:05   Ct Femur Left Wo Contrast  05/07/2014   CLINICAL DATA:  Previous incision and drainage of the lateral LEFT thigh. Open thigh wound, left, sequela S71.102S (ICD-10-CM)  EXAM: CT OF THE LEFT FEMUR WITHOUT CONTRAST  TECHNIQUE: Multidetector CT  imaging was performed according to the standard protocol. Multiplanar CT image reconstructions were also generated.  COMPARISON:  12/25/2013.  FINDINGS: Extensive subcutaneous calcification is present, likely due to a combination of heterotopic bone, dystrophic calcification and injection granulomata. Infiltration of the subcutaneous tissues is present in the anterior lateral LEFT thigh. This is the area of previously seen open wound extending deep to some of the calcifications.  There is an ellipsoid fluid collection just deep to the vastus lateralis muscular fascia. Small locules of gas are present in this location. When measured craniocaudal, this is 8.2 cm. On axial image 98, the collection measures 18 mm transverse by 5 cm AP. No subcutaneous fluid collection is identified. Gas tracks around to the rectus femoris muscle, consistent with extension of the abscess through the anterior compartment.  Phlegmon is present in the soft tissues, likely representing cellulitis although nonspecific. Edema and postoperative changes can produce this appearance as well. The visceral pelvis appears within normal limits. Mild symmetric atrophy of the anterior muscular compartment is present bilaterally. The RIGHT thigh is partially visible.  IMPRESSION: 1. Recurrent deep soft tissue abscess in the vastus lateralis and rectus femoris, just deep to the superficial muscular fascia. On this noncontrast study, the abscess is demarcated by fluid and gas within the anterior lateral musculature of the LEFT thigh. 2. No subcutaneous abscess. Infiltration of the subcutaneous tissues may represent scarring or more likely cellulitis in the setting of deep abscess. 3. Dystrophic calcifications throughout the subcutaneous tissues. Potentially, these could serve as a nidus for recurrent infection.   Electronically Signed   By: Dereck Ligas M.D.   On: 05/07/2014 15:17   Dg Chest Port 1 View  05/25/2014   CLINICAL DATA:  Fever and altered  mental status. History of MRSA and a thigh abscess.  EXAM: PORTABLE CHEST - 1 VIEW  COMPARISON:  01/16/2014  FINDINGS: The heart size and mediastinal contours are within normal limits. Both lungs are clear. No pleural effusion or pneumothorax. The visualized skeletal structures are unremarkable.  IMPRESSION: No active disease.   Electronically Signed   By: Lajean Manes M.D.   On: 05/25/2014 12:49    Microbiology: Recent Results (from the past  240 hour(s))  Culture, blood (routine x 2)     Status: None (Preliminary result)   Collection Time: 05/23/14  6:32 AM  Result Value Ref Range Status   Specimen Description BLOOD RIGHT HAND  Final   Special Requests BOTTLES DRAWN AEROBIC AND ANAEROBIC 8CCS  Final   Culture   Final           BLOOD CULTURE RECEIVED NO GROWTH TO DATE CULTURE WILL BE HELD FOR 5 DAYS BEFORE ISSUING A FINAL NEGATIVE REPORT Performed at [facility]    Report Status PENDING  Incomplete  Culture, blood (routine x 2)     Status: None (Preliminary result)   Collection Time: 05/23/14  8:18 AM  Result Value Ref Range Status   Specimen Description BLOOD LEFT ANTECUBITAL  Final   Special Requests BOTTLES DRAWN AEROBIC ONLY 5CC  Final   Culture   Final           BLOOD CULTURE RECEIVED NO GROWTH TO DATE CULTURE WILL BE HELD FOR 5 DAYS BEFORE ISSUING A FINAL NEGATIVE REPORT Performed at [facility]    Report Status PENDING  Incomplete  Surgical pcr screen     Status: Abnormal   Collection Time: 05/23/14  3:33 PM  Result Value Ref Range Status   MRSA, PCR POSITIVE (A) NEGATIVE Final   Staphylococcus aureus POSITIVE (A) NEGATIVE Final    Comment:        The Xpert SA Assay (FDA approved for NASAL specimens in patients over 42 years of age), is one component of a comprehensive surveillance program.  Test performance has been validated by [facility] for patients greater than or equal to 66 year old. It is not intended to diagnose infection nor to guide or  monitor treatment.   Anaerobic culture     Status: None (Preliminary result)   Collection Time: 05/23/14  7:36 PM  Result Value Ref Range Status   Specimen Description ABSCESS THIGH LEFT  Final   Special Requests PATIENT ON FOLLOWING DOXYCYCLINE, ZITHROMAX  Final   Gram Stain   Final    RARE WBC PRESENT, PREDOMINANTLY PMN NO SQUAMOUS EPITHELIAL CELLS SEEN FEW GRAM POSITIVE COCCI IN CLUSTERS Performed at [facility]    Culture   Final    NO ANAEROBES ISOLATED; CULTURE IN PROGRESS FOR 5 DAYS Performed at [facility]    Report Status PENDING  Incomplete  Culture, routine-abscess     Status: None   Collection Time: 05/23/14  7:36 PM  Result Value Ref Range Status   Specimen Description ABSCESS THIGH LEFT  Final   Special Requests NONE  Final   Gram Stain   Final    RARE WBC PRESENT, PREDOMINANTLY PMN NO SQUAMOUS EPITHELIAL CELLS SEEN FEW GRAM POSITIVE COCCI IN CLUSTERS Performed at [facility]    Culture   Final    MODERATE METHICILLIN RESISTANT STAPHYLOCOCCUS AUREUS Note: RIFAMPIN AND GENTAMICIN SHOULD NOT BE USED AS SINGLE DRUGS FOR TREATMENT OF STAPH INFECTIONS. CRITICAL RESULT CALLED TO, READ BACK BY AND VERIFIED WITH:  DAPHINE JOHNSON RN @0850  05/26/14 BY KENDR Performed at [facility]    Report Status 05/26/2014 FINAL  Final   Organism ID, Bacteria METHICILLIN RESISTANT STAPHYLOCOCCUS AUREUS  Final      Susceptibility   Methicillin resistant staphylococcus aureus - MIC*    CLINDAMYCIN >=8 RESISTANT Resistant     ERYTHROMYCIN >=8 RESISTANT Resistant     GENTAMICIN <=0.5 SENSITIVE Sensitive     LEVOFLOXACIN >=  8 RESISTANT Resistant     OXACILLIN >=4 RESISTANT Resistant     PENICILLIN >=0.5 RESISTANT Resistant     RIFAMPIN <=0.5 SENSITIVE Sensitive     TRIMETH/SULFA <=10 SENSITIVE Sensitive     VANCOMYCIN 1 SENSITIVE Sensitive     TETRACYCLINE >=16 RESISTANT Resistant     * MODERATE METHICILLIN RESISTANT STAPHYLOCOCCUS AUREUS  Fungus  Culture with Smear     Status: None (Preliminary result)   Collection Time: 05/23/14  7:36 PM  Result Value Ref Range Status   Specimen Description ABSCESS THIGH LEFT  Final   Special Requests PATIENT ON FOLLOWING DOXYCYCLINE, ZITHROMAX  Final   Fungal Smear   Final    NO YEAST OR FUNGAL ELEMENTS SEEN Performed at [facility]    Culture   Final    CULTURE IN PROGRESS FOR FOUR WEEKS Performed at [facility]    Report Status PENDING  Incomplete  AFB culture with smear     Status: None (Preliminary result)   Collection Time: 05/23/14  7:36 PM  Result Value Ref Range Status   Specimen Description ABSCESS THIGH LEFT  Final   Special Requests PATIENT ON FOLLOWING DOXYCYCLINE, ZITHROMAX  Final   Acid Fast Smear   Final    NO ACID FAST BACILLI SEEN Performed at [facility]    Culture   Final    CULTURE WILL BE EXAMINED FOR 6 WEEKS BEFORE ISSUING A FINAL REPORT Performed at [facility]    Report Status PENDING  Incomplete  Tissue culture     Status: None   Collection Time: 05/23/14  7:45 PM  Result Value Ref Range Status   Specimen Description TISSUE THIGH LEFT  Final   Special Requests PATIENT ON FOLLOWING DOXYCYCLINE, ZITHROMAX  Final   Gram Stain   Final    NO WBC SEEN NO ORGANISMS SEEN Performed at [facility]    Culture   Final    NO GROWTH 3 DAYS Performed at [facility]    Report Status 05/27/2014 FINAL  Final  Anaerobic culture     Status: None (Preliminary result)   Collection Time: 05/23/14  7:45 PM  Result Value Ref Range Status   Specimen Description TISSUE THIGH LEFT  Final   Special Requests PATIENT ON FOLLOWING ZITHROMAX, DOXYCYCLINE  Final   Gram Stain   Final    NO WBC SEEN NO ORGANISMS SEEN Performed at [facility]    Culture   Final    NO ANAEROBES ISOLATED; CULTURE IN PROGRESS FOR 5 DAYS Performed at [facility]    Report Status PENDING  Incomplete  Fungus Culture with Smear      Status: None (Preliminary result)   Collection Time: 05/23/14  7:45 PM  Result Value Ref Range Status   Specimen Description TISSUE THIGH LEFT  Final   Special Requests PATIENT ON FOLLOWING ZITHROMAX, DOXYCYCLINE  Final   Fungal Smear   Final    NO YEAST OR FUNGAL ELEMENTS SEEN Performed at [facility]    Culture   Final    CULTURE IN PROGRESS FOR FOUR WEEKS Performed at [facility]    Report Status PENDING  Incomplete  AFB culture with smear     Status: None (Preliminary result)   Collection Time: 05/23/14  7:45 PM  Result Value Ref Range Status   Specimen Description TISSUE THIGH LEFT  Final   Special Requests PATIENT ON FOLLOWING ZITHROMAX, DOXYCYCLINE  Final   Acid  Fast Smear   Final    NO ACID FAST BACILLI SEEN Performed at [facility]    Culture   Final    CULTURE WILL BE EXAMINED FOR 6 WEEKS BEFORE ISSUING A FINAL REPORT Performed at [facility]    Report Status PENDING  Incomplete  Culture, blood (routine x 2)     Status: None (Preliminary result)   Collection Time: 05/25/14  9:10 AM  Result Value Ref Range Status   Specimen Description BLOOD HAND RIGHT  Final   Special Requests BOTTLES DRAWN AEROBIC ONLY 3CC  Final   Culture   Final           BLOOD CULTURE RECEIVED NO GROWTH TO DATE CULTURE WILL BE HELD FOR 5 DAYS BEFORE ISSUING A FINAL NEGATIVE REPORT Performed at [facility]    Report Status PENDING  Incomplete  Culture, blood (routine x 2)     Status: None (Preliminary result)   Collection Time: 05/25/14  9:22 AM  Result Value Ref Range Status   Specimen Description BLOOD HAND LEFT  Final   Special Requests BOTTLES DRAWN AEROBIC ONLY 4CC  Final   Culture   Final           BLOOD CULTURE RECEIVED NO GROWTH TO DATE CULTURE WILL BE HELD FOR 5 DAYS BEFORE ISSUING A FINAL NEGATIVE REPORT Performed at [facility]    Report Status PENDING  Incomplete  Culture, Urine     Status: None   Collection Time: 05/25/14   5:15 PM  Result Value Ref Range Status   Specimen Description URINE, CLEAN CATCH  Final   Special Requests NONE  Final   Colony Count NO GROWTH Performed at [facility]   Final   Culture NO GROWTH Performed at [facility]   Final   Report Status 05/27/2014 FINAL  Final     Labs: Basic Metabolic Panel:  Recent Labs Lab 05/23/14 0016 05/23/14 5465 05/23/14 0639 05/24/14 0704 05/26/14 0757 05/28/14 0421  NA 139  --   --  136 135 137  K 3.3*  --   --  4.3 3.7 3.5  CL 100  --   --  96 94* 99  CO2 27  --   --  26 26 27   GLUCOSE 117*  --   --  98 115* 124*  BUN 19  --   --  10 14 11   CREATININE 1.14* 1.08  --  0.93 1.00 0.97  CALCIUM 9.3  --   --  8.3* 8.6 8.7  MG  --   --  1.9  --   --   --    Liver Function Tests:  Recent Labs Lab 05/24/14 0704  AST 16  ALT 12  ALKPHOS 76  BILITOT 1.0  PROT 5.3*  ALBUMIN 2.8*   No results for input(s): LIPASE, AMYLASE in the last 168 hours. No results for input(s): AMMONIA in the last 168 hours. CBC:  Recent Labs Lab 05/24/14 0704 05/25/14 0922 05/26/14 0757 05/27/14 0800 05/28/14 0421  WBC 7.4 10.5 10.7* 7.3 9.4  NEUTROABS 5.1 8.9* 7.7 5.4 6.9  HGB 8.9* 9.4* 9.6* 8.7* 9.5*  HCT 28.8* 29.5* 30.0* 27.4* 30.5*  MCV 95.7 93.4 92.3 93.5 93.6  PLT 207 148* 226 214 259   Cardiac Enzymes: No results for input(s): CKTOTAL, CKMB, CKMBINDEX, TROPONINI in the last 168 hours. BNP: BNP (last 3 results) No results for input(s): BNP in the last 8760 hours.  ProBNP (last 3 results) No results for input(s): PROBNP in the last 8760 hours.  CBG:  Recent Labs Lab 05/27/14 1707 05/27/14 1942 05/27/14 2200 05/28/14 0638 05/28/14 1123  GLUCAP 118* 113* 140* 124* 127*       Signed:  Katessa Attridge  [facility] 05/28/2014, 1:51 PM

## 2014-05-28 NOTE — Progress Notes (Signed)
Ricky from [facility] called me to say someone is on their way from [facility] to bring the wound vac. Pt is resting at this time and still excited to go home at this point regardless of the time. Pt has been back and forth on this matter. Pt is also stating that she is missing her cane. I have searched the room and cannot find a cane. Will continue to monitor and call [facility] for transfer when wound vac arrives. Belem Hintze, Rande Brunt, RN

## 2014-05-28 NOTE — Progress Notes (Signed)
CARE MANAGEMENT NOTE 05/28/2014  Patient:  [name] Carr, [name] Carr   Account Number:  [id number]  Date Initiated:  05/23/2014  Documentation initiated by:  Lorne Skeens  Subjective/Objective Assessment:   Patient was admitted with recurrent thigh abscess. Recently discharged home on PO antibiotics following a surgical debridement. Patient is active with [facility] RN/CSW     Action/Plan:   Will follow for discharge needs pending PT/OT evals and physician orders.   Anticipated DC Date:     Anticipated DC Plan:  [place]  CM consult      Choice offered to / List presented to:             Status of service:  In process, will continue to follow Medicare Important Message given?  YES (If response is "NO", the following Medicare IM given date fields will be blank) Date Medicare IM given:  05/27/2014 Medicare IM given by:  Lorne Skeens Date Additional Medicare IM given:   Additional Medicare IM given by:    Discharge Disposition:    Per UR Regulation:  Reviewed for med. necessity/level of care/duration of stay  If discussed at [place] of Stay Meetings, dates discussed:    Comments:  05/27/14 [place] RN, MSN, CM- Met with patient to verify that the discharge dispostion will be home with home health.  Per attending MD, plan is to discharge tomorrow 05/28/14.  CM spoke with Abigail Butts at Dr Randel Pigg office to notify that wound vac script is on the shadow chart and needs signed.  Spoke with Tomasita Morrow at [facility] regarding need for Vac and requested information faxed.  Will fax signed script once available.  CSW aware.   05/27/14 [place], MSN, CM- Wound Vac script placed on patient's shadow chart for physician to complete in anticipation of need for Vac at discharge.  Bedside RN notified of need for physician signature.  Awaiting final decision on discharge disposition.   05/23/14 [place], MSN, CM- Met  with patient to verify which company she is using for home health services.  Per patient, she is active with [place]. Mary with [facility] was notified of patient's admission and will continue to follow for discharge disposition.

## 2014-05-28 NOTE — Progress Notes (Signed)
ED CM faxed new referral  to [place], awaiting processing. Updated Sarah RN on 4N. Information being processed wound vac will be delivered tonight, patient is wanting to leave tonight. Ricki states, she will contact Production assistant, radio on floor on ETA. No further ED CM needs identified.

## 2014-05-28 NOTE — Care Management (Addendum)
ED CM received a call from ED CSW regarding patient discharge pending arrival of wound vac. Contacted Ricki at [facility], she stated additional information required for insurance approval, requiring the physicians signature. She will fax form to CM office.  ED CM  Updated Sarah RN on 4N . CM will continue to follow.

## 2014-05-29 LAB — CBC WITH DIFFERENTIAL/PLATELET
Basophils Absolute: 0 10*3/uL (ref 0.0–0.1)
Basophils Relative: 0 % (ref 0–1)
EOS PCT: 1 % (ref 0–5)
Eosinophils Absolute: 0.1 10*3/uL (ref 0.0–0.7)
HEMATOCRIT: 32.6 % — AB (ref 36.0–46.0)
Hemoglobin: 10.4 g/dL — ABNORMAL LOW (ref 12.0–15.0)
LYMPHS ABS: 2.1 10*3/uL (ref 0.7–4.0)
Lymphocytes Relative: 21 % (ref 12–46)
MCH: 29.4 pg (ref 26.0–34.0)
MCHC: 31.9 g/dL (ref 30.0–36.0)
MCV: 92.1 fL (ref 78.0–100.0)
MONO ABS: 1 10*3/uL (ref 0.1–1.0)
Monocytes Relative: 9 % (ref 3–12)
Neutro Abs: 7.1 10*3/uL (ref 1.7–7.7)
Neutrophils Relative %: 69 % (ref 43–77)
Platelets: 315 10*3/uL (ref 150–400)
RBC: 3.54 MIL/uL — ABNORMAL LOW (ref 3.87–5.11)
RDW: 15.3 % (ref 11.5–15.5)
WBC: 10.3 10*3/uL (ref 4.0–10.5)

## 2014-05-29 LAB — ANAEROBIC CULTURE: Gram Stain: NONE SEEN

## 2014-05-29 LAB — CULTURE, BLOOD (ROUTINE X 2)
CULTURE: NO GROWTH
Culture: NO GROWTH

## 2014-05-29 LAB — GLUCOSE, CAPILLARY: Glucose-Capillary: 113 mg/dL — ABNORMAL HIGH (ref 70–99)

## 2014-05-29 NOTE — Progress Notes (Signed)
Patient is discharged form room 4N04 at this time. Alert and in stable condition. Instructions read to patient and understanding verbalized. Left unit for home via stretcher by PTAR with all belongings at side.

## 2014-05-29 NOTE — Progress Notes (Signed)
This RN went to print the AVS form for the patient to sign and it stated that the "discharge order reconciliation is not complete" and could not be printed. The discharge note and order have already been completed from day shift prior. NP on call paged twice with no response. MD's on call in the ED stated they were busy in the ED and could not reconcile the paperwork. MD at [facility] referred [place] to the [facility] MD. Patient has been waiting in her room for over an hour for this paperwork to be printed so she could sign it and go home. PTAR arrived and has waited on the unit for over an hour as well. They had to leave at this point without the patient.   Patient is visibly and emotionally upset. She wants to leave now but RN explained she could not leave without the discharge paperwork signed. She stated she does not want to leave AMA but feels like she is being "held captive" here this evening. Through her frustration she is asking for the name of the president of the hospital. Charge nurse has been by the bedside with me trying to calm the pt. Pt is unsure of what to do at this point. Continuing to monitor. Nester Bachus, Rande Brunt, RN

## 2014-05-29 NOTE — Progress Notes (Signed)
Patient to be d/c'd today.  A order was placed for bacitracin and chlorahexadine cloths which caused the AVS to no longer be reconciled Corrected this AM and patient to be d/c'd [name] Carr

## 2014-05-29 NOTE — Progress Notes (Addendum)
Wound vac has arrived and patient signed for it. This RN connected her new wound vac and educated the patient on battery life, how to change the drain container and what the settings should be. Pt's belongings are packed and transportation was called. Pain medications and nausea meds given. Pt's home medications received from Pharmacy. Vitals taken and IV removed. Paperwork printed and signed for medications. Will continue to monitor. Reiley Bertagnolli, Rande Brunt, RN

## 2014-05-31 LAB — CULTURE, BLOOD (ROUTINE X 2)
CULTURE: NO GROWTH
Culture: NO GROWTH

## 2014-06-19 ENCOUNTER — Inpatient Hospital Stay: Payer: Self-pay | Admitting: Internal Medicine

## 2014-06-20 LAB — FUNGUS CULTURE W SMEAR
FUNGAL SMEAR: NONE SEEN
Fungal Smear: NONE SEEN

## 2014-06-26 ENCOUNTER — Inpatient Hospital Stay: Payer: Self-pay | Admitting: Internal Medicine

## 2014-06-28 ENCOUNTER — Other Ambulatory Visit: Payer: Self-pay | Admitting: Internal Medicine

## 2014-07-02 ENCOUNTER — Telehealth: Payer: Self-pay | Admitting: *Deleted

## 2014-07-02 NOTE — Telephone Encounter (Signed)
Dr. Randel Pigg office staff, Frederik Pear.  I and D of thigh today.  Wound Vac replaced.  A "new" wound on thigh next to the previous wound.  Wound Vac applied to both areas.  Pt has [facility] appt w/ Dr. Megan Salon scheduled for 07/15/14.

## 2014-07-07 LAB — AFB CULTURE WITH SMEAR (NOT AT ARMC)
Acid Fast Smear: NONE SEEN
Acid Fast Smear: NONE SEEN

## 2014-07-08 ENCOUNTER — Encounter (HOSPITAL_COMMUNITY): Payer: Self-pay | Admitting: Orthopedic Surgery

## 2014-07-09 ENCOUNTER — Other Ambulatory Visit (HOSPITAL_BASED_OUTPATIENT_CLINIC_OR_DEPARTMENT_OTHER): Payer: Self-pay | Admitting: Orthopedic Surgery

## 2014-07-09 ENCOUNTER — Other Ambulatory Visit: Payer: Self-pay | Admitting: Internal Medicine

## 2014-07-09 ENCOUNTER — Other Ambulatory Visit: Payer: Self-pay | Admitting: Orthopedic Surgery

## 2014-07-09 DIAGNOSIS — B999 Unspecified infectious disease: Secondary | ICD-10-CM

## 2014-07-09 DIAGNOSIS — S81802A Unspecified open wound, left lower leg, initial encounter: Secondary | ICD-10-CM

## 2014-07-13 ENCOUNTER — Encounter (HOSPITAL_BASED_OUTPATIENT_CLINIC_OR_DEPARTMENT_OTHER): Payer: Self-pay | Admitting: *Deleted

## 2014-07-13 ENCOUNTER — Emergency Department (HOSPITAL_BASED_OUTPATIENT_CLINIC_OR_DEPARTMENT_OTHER): Payer: Medicare Other

## 2014-07-13 ENCOUNTER — Emergency Department (HOSPITAL_BASED_OUTPATIENT_CLINIC_OR_DEPARTMENT_OTHER)
Admission: EM | Admit: 2014-07-13 | Discharge: 2014-07-14 | Disposition: A | Discharge status: Home / Self Care | Payer: Medicare Other | Attending: Emergency Medicine | Admitting: Emergency Medicine

## 2014-07-13 DIAGNOSIS — Z872 Personal history of diseases of the skin and subcutaneous tissue: Secondary | ICD-10-CM | POA: Insufficient documentation

## 2014-07-13 DIAGNOSIS — Z79899 Other long term (current) drug therapy: Secondary | ICD-10-CM | POA: Insufficient documentation

## 2014-07-13 DIAGNOSIS — R0781 Pleurodynia: Secondary | ICD-10-CM | POA: Diagnosis present

## 2014-07-13 DIAGNOSIS — S2231XS Fracture of one rib, right side, sequela: Secondary | ICD-10-CM

## 2014-07-13 DIAGNOSIS — F329 Major depressive disorder, single episode, unspecified: Secondary | ICD-10-CM | POA: Insufficient documentation

## 2014-07-13 DIAGNOSIS — S2241XS Multiple fractures of ribs, right side, sequela: Secondary | ICD-10-CM | POA: Diagnosis not present

## 2014-07-13 DIAGNOSIS — Z8619 Personal history of other infectious and parasitic diseases: Secondary | ICD-10-CM | POA: Diagnosis not present

## 2014-07-13 DIAGNOSIS — G629 Polyneuropathy, unspecified: Secondary | ICD-10-CM | POA: Diagnosis not present

## 2014-07-13 DIAGNOSIS — K219 Gastro-esophageal reflux disease without esophagitis: Secondary | ICD-10-CM | POA: Diagnosis not present

## 2014-07-13 DIAGNOSIS — F419 Anxiety disorder, unspecified: Secondary | ICD-10-CM | POA: Diagnosis not present

## 2014-07-13 DIAGNOSIS — Z862 Personal history of diseases of the blood and blood-forming organs and certain disorders involving the immune mechanism: Secondary | ICD-10-CM | POA: Insufficient documentation

## 2014-07-13 DIAGNOSIS — E119 Type 2 diabetes mellitus without complications: Secondary | ICD-10-CM | POA: Insufficient documentation

## 2014-07-13 DIAGNOSIS — G894 Chronic pain syndrome: Secondary | ICD-10-CM | POA: Insufficient documentation

## 2014-07-13 DIAGNOSIS — W1839XS Other fall on same level, sequela: Secondary | ICD-10-CM | POA: Diagnosis not present

## 2014-07-13 DIAGNOSIS — G43909 Migraine, unspecified, not intractable, without status migrainosus: Secondary | ICD-10-CM | POA: Insufficient documentation

## 2014-07-13 DIAGNOSIS — M1990 Unspecified osteoarthritis, unspecified site: Secondary | ICD-10-CM | POA: Diagnosis not present

## 2014-07-13 DIAGNOSIS — I10 Essential (primary) hypertension: Secondary | ICD-10-CM | POA: Insufficient documentation

## 2014-07-13 DIAGNOSIS — M069 Rheumatoid arthritis, unspecified: Secondary | ICD-10-CM | POA: Diagnosis not present

## 2014-07-13 DIAGNOSIS — Z853 Personal history of malignant neoplasm of breast: Secondary | ICD-10-CM | POA: Diagnosis not present

## 2014-07-13 DIAGNOSIS — Z8709 Personal history of other diseases of the respiratory system: Secondary | ICD-10-CM | POA: Insufficient documentation

## 2014-07-13 NOTE — ED Notes (Signed)
Pt arrived via [facility] with c/o right ribcage pain. States she fell one week ago. States she had an xray done but states she thinks it was "ok" but they were sending off the xray for further testing. Also would like her wound vac to her left ant thigh area to be checked as well. Concerned the area surrounding the wound is red. States she has a wound vac due to cellulitis.  States she has had fevers on and off. C/o pain to right rib cage area more with movement and breathing.

## 2014-07-14 ENCOUNTER — Encounter (HOSPITAL_BASED_OUTPATIENT_CLINIC_OR_DEPARTMENT_OTHER): Payer: Self-pay | Admitting: Emergency Medicine

## 2014-07-14 DIAGNOSIS — S2241XS Multiple fractures of ribs, right side, sequela: Secondary | ICD-10-CM | POA: Diagnosis not present

## 2014-07-14 MED ORDER — ONDANSETRON 8 MG PO TBDP
ORAL_TABLET | ORAL | Status: AC
  Administered 2014-07-14: 8 mg
  Filled 2014-07-14: qty 1

## 2014-07-14 MED ORDER — MECLIZINE HCL 25 MG PO TABS
12.5000 mg | ORAL_TABLET | Freq: Once | ORAL | Status: AC
  Administered 2014-07-14: 12.5 mg via ORAL
  Filled 2014-07-14: qty 1

## 2014-07-14 MED ORDER — HYDROMORPHONE HCL 1 MG/ML IJ SOLN
0.5000 mg | Freq: Once | INTRAMUSCULAR | Status: AC
  Administered 2014-07-14: 0.5 mg via INTRAMUSCULAR
  Filled 2014-07-14: qty 1

## 2014-07-14 MED ORDER — METHOCARBAMOL 500 MG PO TABS
1000.0000 mg | ORAL_TABLET | Freq: Once | ORAL | Status: AC
  Administered 2014-07-14: 1000 mg via ORAL
  Filled 2014-07-14: qty 2

## 2014-07-14 MED ORDER — HYDROMORPHONE HCL 1 MG/ML IJ SOLN
2.0000 mg | Freq: Once | INTRAMUSCULAR | Status: AC
  Administered 2014-07-14: 2 mg via INTRAMUSCULAR
  Filled 2014-07-14: qty 2

## 2014-07-14 MED ORDER — LIDOCAINE 5 % EX PTCH: 1.0000 | MEDICATED_PATCH | CUTANEOUS | Status: DC

## 2014-07-14 NOTE — ED Notes (Signed)
Pt admits that the fentanyl patch on L shoulder was placed 7d ago.

## 2014-07-14 NOTE — ED Notes (Signed)
Pt sneezing, c/o worse pain. Plan for d/c explained. Pt reports "not wanting to go home. Not sure how she will make it, listing multiple complaints, uncontrolled pain, wanting more pain med". EDP aware, to see pt.

## 2014-07-14 NOTE — ED Provider Notes (Signed)
CSN: [id number]     Arrival date & time 07/13/14  2330 History   First MD Initiated Contact with Patient 07/14/14 0257     Chief Complaint  Patient presents with  . rib pain      (Consider location/radiation/quality/duration/timing/severity/associated sxs/prior Treatment) Patient is a 59 y.o. female presenting with fall. The history is provided by the patient.  Fall This is a new problem. The current episode started more than 1 week ago (patient reports approximately 2 weeks ago, states she saw her PMD for same and he sent Xray out for review.  ). The problem occurs rarely. The problem has not changed since onset.Pertinent negatives include no chest pain, no abdominal pain, no headaches and no shortness of breath. Nothing aggravates the symptoms. Nothing relieves the symptoms. Treatments tried: hydrocodone. The treatment provided no relief.  Patient states she is in terrible pain since the fall approximately 2 weeks ago and no one including her pain management doctor is addressing her pain.  States she is "was take off fentanyl patches and they don't work anyhow.  States there are multiple untrue things in her chart and she doesn't want to say this " but  the only thing that works for her pain is dilaudid."  Past Medical History  Diagnosis Date  . PERSONALITY DISORDER   . Chronic pain syndrome     chronic narcotics, dependancy hx - [place] for pain mgmt  . MIGRAINE HEADACHE   . Lyme disease   . ALLERGIC RHINITIS   . ANEMIA-IRON DEFICIENCY   . HYPERLIPIDEMIA   . GERD   . DIABETES MELLITUS, TYPE II   . DEPRESSION   . SLE (systemic lupus erythematosus)     [facility] - chronic pred  . Psoriasis   . Addison's disease   . Degenerative joint disease   . DDD (degenerative disc disease)   . Phlebitis after infusion     left ?calf; "S/P phenergan/demerol injection"  . Blood clot in vein 2011; 02/04/11    "right jugular vein;left jugular vein"  . SEIZURE DISORDER     "lots of  seizures; due to lupus"  . Anxiety   . Neuropathy     hands, feet, due to diabetes & back problem  . Surgical wound infection 02/15/2011    R shoulder and mastectomy site - related to PICC  . CARCINOMA, BREAST 08/2009 dx    R breast,  mets to liver - resolution s/p chemo, s/p B mastect  . Metastases to the liver   . RA (rheumatoid arthritis)   . Hypothyroidism pt states she has never had hy pothyroidism  . Hypertension    Past Surgical History  Procedure Laterality Date  . Liver bopsy  09/2009  . Port a cath placement  09/2009; 11/19/10  . Port-a-cath removal  04/2010    "due to staph & strept"  . Port-a-cath removal  12/23/2010    Procedure: REMOVAL PORT-A-CATH;  Surgeon: Pedro Earls, MD;  Location: [facility];  Service: General;  Laterality: Left;  . Breast surgery  09/2009    right breast biopsy  . Mastectomy  11/19/10    bilateral mastectomy   . I&d extremity  ~ 2010    right hand; S/P "dog bite"  . Peripherally inserted central catheter insertion    . Spider bite    . Incision and drainage abscess Left 12/22/2013    Procedure: INCISION AND DRAINAGE ABSCESS OF LEFT THIGH;  Surgeon: Donnie Mesa, MD;  Location: [facility]  OR;  Service: General;  Laterality: Left;  . I&d extremity Left 05/09/2014    Procedure: IRRIGATION AND DEBRIDEMENT LEFT THIGH ABSCESS;  Surgeon: Meredith Pel, MD;  Location: [facility];  Service: Orthopedics;  Laterality: Left;  . I&d extremity Left 05/23/2014    Procedure: IRRIGATION AND DEBRIDEMENT EXTREMITY;  Surgeon: Meredith Pel, MD;  Location: [place];  Service: Orthopedics;  Laterality: Left;  . Application of wound vac Left 05/23/2014    Procedure: APPLICATION OF WOUND VAC;  Surgeon: Meredith Pel, MD;  Location: [place];  Service: Orthopedics;  Laterality: Left;   Family History  Problem Relation Age of Onset  . Lung cancer Mother   . Lung cancer Father   . Arthritis Other   . Diabetes Other   . Hyperlipidemia Other    History  Substance Use  Topics  . Smoking status: Never Smoker   . Smokeless tobacco: Never Used     Comment: Never used tobacco  . Alcohol Use: No   OB History    No data available     Review of Systems  Constitutional: Negative for fever.  Respiratory: Negative for cough, chest tightness, shortness of breath and wheezing.   Cardiovascular: Negative for chest pain, palpitations and leg swelling.  Gastrointestinal: Negative for abdominal pain.  Neurological: Negative for headaches.  All other systems reviewed and are negative.     Allergies  Ammonia; Contrast media; Iohexol; Methotrexate; Midazolam hcl; Nalbuphine; Infliximab; Ketorolac tromethamine; Morphine and related; Nsaids; and Celecoxib  Home Medications   Prior to Admission medications   Medication Sig Start Date End Date Taking? Authorizing Provider  valACYclovir (VALTREX) 1000 MG tablet Take 1,000 mg by mouth 3 (three) times daily.   Yes Historical Provider, MD  acetaminophen (TYLENOL) 500 MG tablet Take 1 tablet (500 mg total) by mouth every 6 (six) hours as needed for mild pain or fever. Total daily tylenol dose from all medications should not exceed 4 gm. Please follow up with your pain MD. 05/14/14   Modena Jansky, MD  azithromycin (ZITHROMAX) 500 MG tablet Take 1 tablet (500 mg total) by mouth daily. 05/28/14   Geradine Girt, DO  clonazePAM (KLONOPIN) 1 MG tablet Take one tablet by mouth twice daily Patient taking differently: Take 1 mg by mouth 2 (two) times daily.  08/28/13   Estill Dooms, MD  docusate sodium (COLACE) 100 MG capsule Take 200 mg by mouth 2 (two) times daily.     Historical Provider, MD  doxycycline (VIBRA-TABS) 100 MG tablet Take 1 tablet (100 mg total) by mouth every 12 (twelve) hours. 05/28/14   Geradine Girt, DO  DULoxetine (CYMBALTA) 60 MG capsule Take 1 capsule (60 mg total) by mouth 2 (two) times daily. 06/11/12   Rowe Clack, MD  fentaNYL (DURAGESIC - DOSED MCG/HR) 25 MCG/HR patch Place 25 mcg onto the skin  every 3 (three) days.    Historical Provider, MD  furosemide (LASIX) 20 MG tablet Take 20 mg by mouth every morning.    Historical Provider, MD  gabapentin (NEURONTIN) 800 MG tablet Take 800 mg by mouth 3 (three) times daily.    Historical Provider, MD  hydrochlorothiazide (HYDRODIURIL) 25 MG tablet Take 25 mg by mouth daily.     Historical Provider, MD  HYDROcodone-acetaminophen (NORCO) 10-325 MG per tablet Take 1 tablet by mouth every 4 (four) hours as needed for moderate pain or severe pain. 12/27/13   Ripudeep Krystal Eaton, MD  ondansetron (ZOFRAN) 4 MG  tablet Take 1 tablet (4 mg total) by mouth every 6 (six) hours as needed for nausea. 05/28/14   Geradine Girt, DO  pantoprazole (PROTONIX) 20 MG tablet Take 20 mg by mouth daily.    Historical Provider, MD  potassium chloride SA (K-DUR,KLOR-CON) 20 MEQ tablet 20 MEQ daily 05/14/14   Modena Jansky, MD  predniSONE (DELTASONE) 5 MG tablet Take 10 mg by mouth daily with breakfast.     Historical Provider, MD  PRESCRIPTION MEDICATION Take 1 tablet by mouth daily. Supportive therapy Cobalt    Historical Provider, MD  promethazine (PHENERGAN) 25 MG suppository Place 25 mg rectally every 6 (six) hours as needed for nausea or vomiting.    Historical Provider, MD  rOPINIRole (REQUIP) 1 MG tablet Take 1 mg by mouth at bedtime.    Historical Provider, MD  senna-docusate (SENOKOT-S) 8.6-50 MG per tablet Take 1 tablet by mouth 2 (two) times daily as needed for mild constipation.     Historical Provider, MD  spironolactone (ALDACTONE) 25 MG tablet Take 25 mg by mouth daily.    Historical Provider, MD  tiZANidine (ZANAFLEX) 4 MG tablet Take 2-4 mg by mouth every 6 (six) hours as needed for muscle spasms.     Historical Provider, MD  traZODone (DESYREL) 150 MG tablet Take 150-300 mg by mouth at bedtime.    Historical Provider, MD   BP 130/66 mmHg  Pulse 67  Temp(Src) 98.1 F (36.7 C) (Oral)  Resp 20  Ht 5' 4.5" (1.638 m)  Wt 179 lb (81.194 kg)  BMI 30.26 kg/m2   SpO2 98% Physical Exam  Constitutional: She is oriented to person, place, and time. She appears well-developed and well-nourished. No distress.  HENT:  Head: Normocephalic and atraumatic. Head is without raccoon's eyes and without Battle's sign.  Right Ear: No mastoid tenderness. No hemotympanum.  Left Ear: No mastoid tenderness. No hemotympanum.  Mouth/Throat: Oropharynx is clear and moist.  Eyes: Conjunctivae and EOM are normal. Pupils are equal, round, and reactive to light.  Neck: Normal range of motion. Neck supple.  Cardiovascular: Normal rate, regular rhythm and intact distal pulses.   Pulmonary/Chest: Effort normal and breath sounds normal. No respiratory distress. She has no decreased breath sounds. She has no wheezes. She has no rhonchi. She has no rales. She exhibits no mass, no tenderness, no crepitus, no edema, no deformity and no swelling.  There is no bruising or subcutaneous hematomas of the chest.  No crepitance  Abdominal: Soft. Bowel sounds are normal. There is no tenderness. There is no rebound and no guarding.  Musculoskeletal: Normal range of motion. She exhibits no edema.  Neurological: She is alert and oriented to person, place, and time. She has normal reflexes.  Skin: Skin is warm. No rash noted. No erythema. No pallor.  Wound vac on thigh is CDI and has adequate suction there is no surrounding warmth, erythema or fluctuance    ED Course  Procedures (including critical care time) Labs Review Labs Reviewed - No data to display  Imaging Review Dg Ribs Unilateral W/chest Right  07/14/2014   CLINICAL DATA:  RIGHT ribcage pain.  Patient fell 1 week ago.  EXAM: RIGHT RIBS AND CHEST - 3+ VIEW  COMPARISON:  None.  FINDINGS: There are nondisplaced or minimally displaced rib fractures are seen of the RIGHT ribs, anterolaterally ribs 6-9. Both lungs are clear. Heart size and mediastinal contours are within normal limits. Aorta is mildly tortuous. No effusion or pneumothorax.   IMPRESSION: Nondisplaced  or minimally displaced RIGHT 6-9 rib fractures. No pneumothorax.   Electronically Signed   By: Rolla Flatten M.D.   On: 07/14/2014 01:44     EKG Interpretation None      MDM   Final diagnoses:  None    I suspect this fall was 2 weeks ago there is no bruising at this time.  Patient is already seeing pain management for her pain and has been seen by her PMD for same so at this point with normal lung on XR and normal vital signs and oxygen saturations there is no indication for admission.  We have done incentive spirometry in the ED and provided an incentive spirometer.  Moreover, she states she was taken off fentanyl patches but according to the [place] she fille a thirty day supply on 06/30/14 and therefore is still taking them .  She also filler 120 norco 10/325 on the same day.  She was given a dose of dilaudid in the Ed for her pain ( in the dose she request and monitored) but she has a narcotic contract and is instructed to follow up with her pain management specialist Dr. Mee Hives and her PMD in the am.      Gertude Benito, MD 07/14/14 [id number]

## 2014-07-14 NOTE — ED Notes (Signed)
Dr. Randal Buba and this RN at [facility] for team d/c, all questions answered, pt verbalizes understanding and "not liking the plan", requesting more pain med, pain clinic and pain contract explained, PTAR here for transport,

## 2014-07-14 NOTE — ED Notes (Signed)
VSS, pt talking on phone, alert, NAD, calm, speaking in clear complete sentences.

## 2014-07-14 NOTE — ED Notes (Signed)
Updated pt's friend on phone, EDP into room, at [facility]

## 2014-07-14 NOTE — ED Notes (Signed)
Pt ambulatory to b/r with assistance, slow cautious steady gait.

## 2014-07-14 NOTE — ED Notes (Signed)
Pt sleeping, NAD, calm, BP lower, will continue to monitor, VSS.

## 2014-07-14 NOTE — ED Notes (Signed)
Assisted patient to the bathroom by walking with assistance. Patient was slow but had a steady gait to the bathroom. Patient was assisted to the toilet and positioned to use same. Patient advised she could get her pants down and use the bathroom on her own. Advised patient assistance was right outside the door and same was unlocked. Waited outside the bathroom while patient used same. Patient did have some episodes of crying and when asked if she was ok she advised "I am ok, just hurting". About 2 mins went by and nothing was heard. Knocked on the door and opened same to watch the patient grabbing the handicap bar lowering herself to the ground in a seated position. Patient then laid herself out on the floor with her hands over her head. When asked if she was ok the patient advised she was dizzy and had to sit down. Patient was assisted off the floor and into a wheelchair where she was wheeled back in her room, which was room #1. Upon transferring the patient and getting her back into her bed comfortably the patient stated "I am hurting more from the fall I had in the bathroom. Can you give me some pain medicine?" Advised the patient I would let her nurse know that she was hurting more. Placed the call bell within reach of the patient and left the room. Immediately notified charge RN Erlinda Hong of the incident.

## 2014-07-14 NOTE — ED Notes (Signed)
Fell d/t vertigo (intermittent), fell against R anterior/ lateral rib cage, h/o R mastectomy, xray resulted with R 6-9 rib fx, also mentions, "get nauseated easily", some intermittent dizziness, intermittent fever "r/t cellulitis". Currently wearing wound vac. Alert, NAD, calm, interactive, resps e/u, speaking in clear complete sentences.

## 2014-07-14 NOTE — ED Notes (Signed)
Back to stretcher w/o incident or change. Pain meds given. VSS. Alert, NAD, calm, interactive, talkative, no dyspnea.

## 2014-07-14 NOTE — ED Notes (Signed)
Pt alert, NAD, calm, interactive, speaking on phone with "neighbor", Given pain med. Verbalizes understanding of IS use explained by RT. Also verbalizes "anxiety and fear r/t dizziness, falls, being alone, not being hospitalized and pain". Encouraged and reassured.

## 2014-07-15 ENCOUNTER — Inpatient Hospital Stay: Payer: Self-pay | Admitting: Internal Medicine

## 2014-07-19 ENCOUNTER — Ambulatory Visit (HOSPITAL_BASED_OUTPATIENT_CLINIC_OR_DEPARTMENT_OTHER)
Admission: RE | Admit: 2014-07-19 | Discharge: 2014-07-19 | Disposition: A | Discharge status: Home / Self Care | Payer: Medicare Other | Source: Ambulatory Visit | Attending: Orthopedic Surgery | Admitting: Orthopedic Surgery

## 2014-07-19 DIAGNOSIS — X58XXXA Exposure to other specified factors, initial encounter: Secondary | ICD-10-CM | POA: Insufficient documentation

## 2014-07-19 DIAGNOSIS — R609 Edema, unspecified: Secondary | ICD-10-CM | POA: Diagnosis not present

## 2014-07-19 DIAGNOSIS — S71102A Unspecified open wound, left thigh, initial encounter: Secondary | ICD-10-CM | POA: Insufficient documentation

## 2014-07-19 DIAGNOSIS — B999 Unspecified infectious disease: Secondary | ICD-10-CM | POA: Insufficient documentation

## 2014-07-19 DIAGNOSIS — Z01818 Encounter for other preprocedural examination: Secondary | ICD-10-CM | POA: Diagnosis present

## 2014-07-21 ENCOUNTER — Telehealth: Payer: Self-pay | Admitting: Licensed Clinical Social Worker

## 2014-07-21 NOTE — Telephone Encounter (Signed)
Patient unable to keep antibiotics down, nausea and vomiting, on and off since leaving the hospital. Patient is also on valtrex for shingles. Patient wants a PICC line, please advise

## 2014-07-21 NOTE — Telephone Encounter (Signed)
No. My partners or not I have had multiple conversations with Ms. Buccellato informing her that we do not feel she is a candidate for a PICC.

## 2014-07-24 NOTE — Telephone Encounter (Signed)
Patient called and was advised of the doctors answer and she was not happy with it. She advised she will probably switch doctors and go to [facility]. She will however keep her appt here on 08/05/14 to try one more time to get Dr Megan Salon to see she really needs a PICC. Advised her of the time and told to call if she changes her mind in the mean time.

## 2014-07-29 ENCOUNTER — Telehealth: Payer: Self-pay | Admitting: *Deleted

## 2014-07-29 NOTE — Telephone Encounter (Signed)
Patient seen at PCP for broken ribs from fall. She had a CT of her chest to evaluate injury and radiologist saw a single pulmonary nodule. Their recommendation was to follow up with a PET scan. Patient seen by this office for breast cancer. Received radiology reports from [facility] for Dr Marin Olp to review. Dr Marin Olp would like to bring patient in to be seen to follow up on radiology exams. Message sent to scheduler and patient notified of plan.

## 2014-07-30 ENCOUNTER — Telehealth: Payer: Self-pay | Admitting: Hematology & Oncology

## 2014-07-30 NOTE — Telephone Encounter (Signed)
Spoke with pt regarding 7/1 appt. Pt confirmed appt

## 2014-08-04 ENCOUNTER — Other Ambulatory Visit: Payer: Self-pay

## 2014-08-05 ENCOUNTER — Encounter: Payer: Self-pay | Admitting: Hematology & Oncology

## 2014-08-05 ENCOUNTER — Inpatient Hospital Stay: Payer: Self-pay | Admitting: Internal Medicine

## 2014-08-07 ENCOUNTER — Other Ambulatory Visit: Payer: Self-pay | Admitting: *Deleted

## 2014-08-07 DIAGNOSIS — C50919 Malignant neoplasm of unspecified site of unspecified female breast: Secondary | ICD-10-CM

## 2014-08-08 ENCOUNTER — Ambulatory Visit (HOSPITAL_BASED_OUTPATIENT_CLINIC_OR_DEPARTMENT_OTHER): Payer: Medicare Other | Admitting: Hematology & Oncology

## 2014-08-08 ENCOUNTER — Other Ambulatory Visit (HOSPITAL_BASED_OUTPATIENT_CLINIC_OR_DEPARTMENT_OTHER): Payer: Medicare Other

## 2014-08-08 ENCOUNTER — Encounter: Payer: Self-pay | Admitting: Hematology & Oncology

## 2014-08-08 ENCOUNTER — Telehealth: Payer: Self-pay

## 2014-08-08 VITALS — BP 135/71 | HR 85 | Temp 99.2°F | Resp 16 | Ht 64.0 in | Wt 180.0 lb

## 2014-08-08 DIAGNOSIS — C50911 Malignant neoplasm of unspecified site of right female breast: Secondary | ICD-10-CM | POA: Diagnosis present

## 2014-08-08 DIAGNOSIS — R911 Solitary pulmonary nodule: Secondary | ICD-10-CM | POA: Diagnosis not present

## 2014-08-08 DIAGNOSIS — C50919 Malignant neoplasm of unspecified site of unspecified female breast: Secondary | ICD-10-CM

## 2014-08-08 LAB — COMPREHENSIVE METABOLIC PANEL
ALT: 16 U/L (ref 0–35)
AST: 13 U/L (ref 0–37)
Albumin: 3.9 g/dL (ref 3.5–5.2)
Alkaline Phosphatase: 92 U/L (ref 39–117)
BUN: 16 mg/dL (ref 6–23)
CO2: 30 mEq/L (ref 19–32)
CREATININE: 1.08 mg/dL (ref 0.50–1.10)
Calcium: 9.2 mg/dL (ref 8.4–10.5)
Chloride: 97 mEq/L (ref 96–112)
GLUCOSE: 125 mg/dL — AB (ref 70–99)
Potassium: 4.1 mEq/L (ref 3.5–5.3)
SODIUM: 142 meq/L (ref 135–145)
Total Bilirubin: 0.3 mg/dL (ref 0.2–1.2)
Total Protein: 6.7 g/dL (ref 6.0–8.3)

## 2014-08-08 LAB — CBC WITH DIFFERENTIAL (CANCER CENTER ONLY)
BASO#: 0 10*3/uL (ref 0.0–0.2)
BASO%: 0.3 % (ref 0.0–2.0)
EOS ABS: 0.1 10*3/uL (ref 0.0–0.5)
EOS%: 0.6 % (ref 0.0–7.0)
HCT: 38.3 % (ref 34.8–46.6)
HGB: 12.3 g/dL (ref 11.6–15.9)
LYMPH#: 0.8 10*3/uL — ABNORMAL LOW (ref 0.9–3.3)
LYMPH%: 7.3 % — ABNORMAL LOW (ref 14.0–48.0)
MCH: 28.8 pg (ref 26.0–34.0)
MCHC: 32.1 g/dL (ref 32.0–36.0)
MCV: 90 fL (ref 81–101)
MONO#: 1 10*3/uL — AB (ref 0.1–0.9)
MONO%: 8.7 % (ref 0.0–13.0)
NEUT#: 9.5 10*3/uL — ABNORMAL HIGH (ref 1.5–6.5)
NEUT%: 83.1 % — AB (ref 39.6–80.0)
Platelets: 305 10*3/uL (ref 145–400)
RBC: 4.27 10*6/uL (ref 3.70–5.32)
RDW: 15.1 % (ref 11.1–15.7)
WBC: 11.4 10*3/uL — ABNORMAL HIGH (ref 3.9–10.0)

## 2014-08-08 LAB — CANCER ANTIGEN 27.29: CA 27.29: 29 U/mL (ref 0–39)

## 2014-08-10 NOTE — Progress Notes (Signed)
Hematology and Oncology Follow Up Visit  [name] Carr [id number] 08-28-1955 59 y.o. 08/10/2014   Principle Diagnosis:   Metastatic breast cancer-remission  Right lower lobe pulmonary nodule  Current Therapy:    Observation     Interim History:  [name] Carr is back for follow-up. She apparently fell and hurt her right ribs. She had a CT scan which shows some fractures in 3 ribs. However, a 6 mm nodule was noted in her right lower lobe. Because this, she was felt that she needed to come back to see [place] to figure out what to do.  She has the cellulitis in her left thigh. She has a pump attached to the wound area to help with infection control. She has quite a bit of pain where she fell on her right ribs. She is on Duragesic patch.  She's had no cough. I think she may have smoked in the past. She's had no nausea or vomiting.  She's had no fever. She's had no bleeding. There's been no change in bowel or bladder habits.    Medications:  Current outpatient prescriptions:  .  acetaminophen (TYLENOL) 500 MG tablet, Take 1 tablet (500 mg total) by mouth every 6 (six) hours as needed for mild pain or fever. Total daily tylenol dose from all medications should not exceed 4 gm. Please follow up with your pain MD., Disp: , Rfl:  .  cefTRIAXone (ROCEPHIN) 1 G injection, Inject 1 g into the muscle., Disp: , Rfl:  .  clonazePAM (KLONOPIN) 1 MG tablet, Take one tablet by mouth twice daily (Patient taking differently: Take 1 mg by mouth 2 (two) times daily. ), Disp: 60 tablet, Rfl: 5 .  docusate sodium (COLACE) 100 MG capsule, Take 200 mg by mouth 2 (two) times daily. , Disp: , Rfl:  .  DULoxetine (CYMBALTA) 60 MG capsule, Take 1 capsule (60 mg total) by mouth 2 (two) times daily., Disp: 180 capsule, Rfl: 1 .  fentaNYL (DURAGESIC - DOSED MCG/HR) 12 MCG/HR, APPLY 1 PATCH EVERY 72 HOURS, Disp: , Rfl: 0 .  furosemide (LASIX) 20 MG tablet, Take 20 mg by mouth every morning., Disp: , Rfl:  .  gabapentin  (NEURONTIN) 800 MG tablet, Take 800 mg by mouth 3 (three) times daily., Disp: , Rfl:  .  hydrochlorothiazide (HYDRODIURIL) 25 MG tablet, Take 25 mg by mouth daily. , Disp: , Rfl:  .  HYDROcodone-acetaminophen (NORCO) 10-325 MG per tablet, Take 1 tablet by mouth every 4 (four) hours as needed for moderate pain or severe pain., Disp: 45 tablet, Rfl: 0 .  meclizine (ANTIVERT) 12.5 MG tablet, TK 1 T PO TID PRN, Disp: , Rfl: 0 .  ondansetron (ZOFRAN) 4 MG tablet, Take 1 tablet (4 mg total) by mouth every 6 (six) hours as needed for nausea., Disp: 20 tablet, Rfl: 0 .  pantoprazole (PROTONIX) 20 MG tablet, Take 20 mg by mouth daily., Disp: , Rfl:  .  potassium chloride SA (K-DUR,KLOR-CON) 20 MEQ tablet, 20 MEQ daily, Disp: , Rfl:  .  predniSONE (DELTASONE) 5 MG tablet, Take 10 mg by mouth daily with breakfast. , Disp: , Rfl:  .  promethazine (PHENERGAN) 25 MG suppository, Place 25 mg rectally every 6 (six) hours as needed for nausea or vomiting., Disp: , Rfl:  .  rOPINIRole (REQUIP) 1 MG tablet, Take 2 mg by mouth at bedtime. TAKES A 2 MG TAB, Disp: , Rfl:  .  senna-docusate (SENOKOT-S) 8.6-50 MG per tablet, Take 1 tablet by mouth  2 (two) times daily as needed for mild constipation. , Disp: , Rfl:  .  spironolactone (ALDACTONE) 25 MG tablet, Take 25 mg by mouth daily., Disp: , Rfl:  .  tiZANidine (ZANAFLEX) 4 MG tablet, Take 2-4 mg by mouth every 6 (six) hours as needed for muscle spasms. , Disp: , Rfl:  .  traZODone (DESYREL) 150 MG tablet, Take 150-300 mg by mouth at bedtime., Disp: , Rfl:  .  valACYclovir (VALTREX) 1000 MG tablet, Take 1,000 mg by mouth 3 (three) times daily., Disp: , Rfl:  .  PRESCRIPTION MEDICATION, Take 1 tablet by mouth daily. Supportive therapy [facility], Disp: , Rfl:   Allergies:  Allergies  Allergen Reactions  . Ammonia Other (See Comments)    Other reaction(s): ASTHMA Ended up on ventilator from ammonia tabs  . Contrast Media [Iodinated Diagnostic Agents] Other (See Comments)      Other reaction(s): DIFFICULTY BREATHING Doesn't breath well.  . Iohexol Other (See Comments)    SOB   . Methotrexate Anaphylaxis  . Midazolam Hcl Anaphylaxis  . Nalbuphine Other (See Comments)    Can't breathe well  . Bee Venom   . Hydroxychloroquine   . Infliximab Nausea And Vomiting  . Ketorolac Tromethamine Other (See Comments)    Injectable doesn't work and pill hurts stomach.  . Morphine And Related Hives  . Nsaids Nausea And Vomiting  . Penicillins   . Pentazocine   . Rofecoxib   . Tetracyclines & Related     Other reaction(s): ANAPHYLAXIS  . Celecoxib Rash    Past Medical History, Surgical history, Social history, and Family History were reviewed and updated.  Review of Systems: As above  Physical Exam:  height is 5\' 4"  (1.626 m) and weight is 180 lb (81.647 kg). Her oral temperature is 99.2 F (37.3 C). Her blood pressure is 135/71 and her pulse is 85. Her respiration is 16.   Wt Readings from Last 3 Encounters:  08/08/14 180 lb (81.647 kg)  07/13/14 179 lb (81.194 kg)  05/23/14 188 lb 15 oz (85.7 kg)     Well-developed and well-nourished white female in no obvious distress. Head and neck exam shows no ocular or oral lesions. There are no palpable cervical or supraclavicular lymph nodes. Lungs are clear. Cardiac exam regular rate and rhythm with no murmurs, rubs or bruits. Abdomen is soft. She has good bowel sounds present no palpable abdominal mass. There is no palpable fluid wave. Chest wall exam showed bilateral mastectomies. No nodules, no erythema or warmth is noted. No discharge is noted. Extremities shows the area salines in the left anterior thigh. There's no leg swelling.  Lab Results  Component Value Date   WBC 11.4* 08/08/2014   HGB 12.3 08/08/2014   HCT 38.3 08/08/2014   MCV 90 08/08/2014   PLT 305 08/08/2014     Chemistry      Component Value Date/Time   NA 142 08/08/2014 1324   NA 135 04/25/2013 1439   NA 135* 04/08/2013 1148   K 4.1  08/08/2014 1324   K 3.5 04/25/2013 1439   K 4.6 04/08/2013 1148   CL 97 08/08/2014 1324   CL 90* 04/25/2013 1439   CL 91* 03/15/2012 1410   CO2 30 08/08/2014 1324   CO2 32 04/25/2013 1439   CO2 28 04/08/2013 1148   BUN 16 08/08/2014 1324   BUN 26* 04/25/2013 1439   BUN 29.9* 04/08/2013 1148   CREATININE 1.08 08/08/2014 1324   CREATININE 0.94 09/11/2013 1718  CREATININE 1.2* 04/08/2013 1148      Component Value Date/Time   CALCIUM 9.2 08/08/2014 1324   CALCIUM 9.9 04/25/2013 1439   CALCIUM 9.6 04/08/2013 1148   ALKPHOS 92 08/08/2014 1324   ALKPHOS 102* 04/25/2013 1439   ALKPHOS 100 04/08/2013 1148   AST 13 08/08/2014 1324   AST 21 04/25/2013 1439   AST 17 04/08/2013 1148   ALT 16 08/08/2014 1324   ALT 36 04/25/2013 1439   ALT 18 04/08/2013 1148   BILITOT 0.3 08/08/2014 1324   BILITOT 0.70 04/25/2013 1439   BILITOT 0.34 04/08/2013 1148         Impression and Plan: Ms. Renfro is 59 year old female. She has metastatic breast cancer. However, I will think there has been any disease that has been eval for this. Her tumor marker, CA-27-29, has been normal. We checked it today. It was only 29.  I think we will wish to do is repeat a CT scan in 2 months. I think this would be very reasonable.  Again, if there is growth, then I think this will have to come out. Again, I don't see any evidence of metastatic breast cancer. I think she did smoke. She is at risk for a second primary.  I spent about 35 minutes with her. She is very nice. She has a strong faith.    Volanda Napoleon, MD 7/3/20168:16 AM

## 2014-08-26 NOTE — Telephone Encounter (Signed)
error 

## 2014-09-17 ENCOUNTER — Other Ambulatory Visit: Payer: Self-pay | Admitting: Hematology & Oncology

## 2014-09-24 ENCOUNTER — Other Ambulatory Visit: Payer: Self-pay | Admitting: Hematology & Oncology

## 2014-09-25 LAB — ANAEROBIC CULTURE

## 2014-10-04 ENCOUNTER — Other Ambulatory Visit: Payer: Self-pay | Admitting: Hematology & Oncology

## 2014-10-09 ENCOUNTER — Ambulatory Visit (HOSPITAL_BASED_OUTPATIENT_CLINIC_OR_DEPARTMENT_OTHER): Payer: Medicare Other

## 2014-10-09 ENCOUNTER — Telehealth: Payer: Self-pay | Admitting: Hematology & Oncology

## 2014-10-09 ENCOUNTER — Ambulatory Visit: Payer: Medicare Other | Admitting: Hematology & Oncology

## 2014-10-09 ENCOUNTER — Other Ambulatory Visit: Payer: Medicare Other

## 2014-10-09 NOTE — Telephone Encounter (Signed)
Spoke with family member pt is in the hospital and will call back to reschedule appts after she completes rehab

## 2014-10-13 ENCOUNTER — Other Ambulatory Visit: Payer: Self-pay | Admitting: Hematology & Oncology

## 2014-10-27 ENCOUNTER — Telehealth: Payer: Self-pay | Admitting: *Deleted

## 2014-10-27 NOTE — Telephone Encounter (Signed)
Received call from [place], nurse at [place] in [facility].  Per Varney Biles, they are unable to acquire the daptomycin that the orthopedist has ordered.  She was directed to contact ID for an alternative.   No orders or notes from our physicians exist in the computer for this patient.  Per chart review, the patient was dissatisfied with our physicians' answer (no PICC) and decided in June 2016 to seek ID consult at [facility] or [place].  RN relayed the information to [facility] at [place], asked her to contact the ordering orthopedic physician for direction. Landis Gandy, RN

## 2014-11-05 ENCOUNTER — Other Ambulatory Visit: Payer: Medicare Other

## 2014-11-05 ENCOUNTER — Ambulatory Visit: Payer: Medicare Other | Admitting: Hematology & Oncology

## 2014-11-06 ENCOUNTER — Inpatient Hospital Stay
Admission: AD | Admit: 2014-11-06 | Discharge: 2014-12-12 | Disposition: A | Discharge status: Other Acute Inpatient Hospital | Payer: Self-pay | Source: Ambulatory Visit | Attending: Internal Medicine | Admitting: Internal Medicine

## 2014-11-06 DIAGNOSIS — W19XXXA Unspecified fall, initial encounter: Secondary | ICD-10-CM

## 2014-11-06 DIAGNOSIS — J9600 Acute respiratory failure, unspecified whether with hypoxia or hypercapnia: Secondary | ICD-10-CM

## 2014-11-06 DIAGNOSIS — M869 Osteomyelitis, unspecified: Secondary | ICD-10-CM

## 2014-11-07 ENCOUNTER — Other Ambulatory Visit (HOSPITAL_COMMUNITY): Payer: Self-pay

## 2014-11-07 LAB — COMPREHENSIVE METABOLIC PANEL
ALT: 15 U/L (ref 14–54)
AST: 14 U/L — AB (ref 15–41)
Albumin: 2.5 g/dL — ABNORMAL LOW (ref 3.5–5.0)
Alkaline Phosphatase: 89 U/L (ref 38–126)
Anion gap: 9 (ref 5–15)
CHLORIDE: 96 mmol/L — AB (ref 101–111)
CO2: 28 mmol/L (ref 22–32)
Calcium: 9.1 mg/dL (ref 8.9–10.3)
Creatinine, Ser: 0.59 mg/dL (ref 0.44–1.00)
Glucose, Bld: 132 mg/dL — ABNORMAL HIGH (ref 65–99)
POTASSIUM: 3.1 mmol/L — AB (ref 3.5–5.1)
SODIUM: 133 mmol/L — AB (ref 135–145)
Total Bilirubin: 0.5 mg/dL (ref 0.3–1.2)
Total Protein: 6 g/dL — ABNORMAL LOW (ref 6.5–8.1)

## 2014-11-07 LAB — CBC
HCT: 33.1 % — ABNORMAL LOW (ref 36.0–46.0)
Hemoglobin: 10.4 g/dL — ABNORMAL LOW (ref 12.0–15.0)
MCH: 27.3 pg (ref 26.0–34.0)
MCHC: 31.4 g/dL (ref 30.0–36.0)
MCV: 86.9 fL (ref 78.0–100.0)
PLATELETS: 260 10*3/uL (ref 150–400)
RBC: 3.81 MIL/uL — AB (ref 3.87–5.11)
RDW: 15.7 % — AB (ref 11.5–15.5)
WBC: 8.4 10*3/uL (ref 4.0–10.5)

## 2014-11-07 LAB — SEDIMENTATION RATE: SED RATE: 75 mm/h — AB (ref 0–22)

## 2014-11-09 LAB — BASIC METABOLIC PANEL
Anion gap: 9 (ref 5–15)
BUN: 9 mg/dL (ref 6–20)
CHLORIDE: 91 mmol/L — AB (ref 101–111)
CO2: 34 mmol/L — AB (ref 22–32)
CREATININE: 0.71 mg/dL (ref 0.44–1.00)
Calcium: 9.6 mg/dL (ref 8.9–10.3)
GFR calc Af Amer: >60 mL/min (ref >60)
GFR calc non Af Amer: >60 mL/min (ref >60)
GLUCOSE: 132 mg/dL — AB (ref 65–99)
Potassium: 3.6 mmol/L (ref 3.5–5.1)
Sodium: 134 mmol/L — ABNORMAL LOW (ref 135–145)

## 2014-11-11 LAB — BASIC METABOLIC PANEL
Anion gap: 11 (ref 5–15)
BUN: 16 mg/dL (ref 6–20)
CHLORIDE: 89 mmol/L — AB (ref 101–111)
CO2: 35 mmol/L — ABNORMAL HIGH (ref 22–32)
Calcium: 8.5 mg/dL — ABNORMAL LOW (ref 8.9–10.3)
Creatinine, Ser: 0.86 mg/dL (ref 0.44–1.00)
Glucose, Bld: 125 mg/dL — ABNORMAL HIGH (ref 65–99)
POTASSIUM: 3.7 mmol/L (ref 3.5–5.1)
SODIUM: 135 mmol/L (ref 135–145)

## 2014-11-11 LAB — CBC
HCT: 34.8 % — ABNORMAL LOW (ref 36.0–46.0)
HEMOGLOBIN: 10.9 g/dL — AB (ref 12.0–15.0)
MCH: 27.8 pg (ref 26.0–34.0)
MCHC: 31.3 g/dL (ref 30.0–36.0)
MCV: 88.8 fL (ref 78.0–100.0)
PLATELETS: 382 10*3/uL (ref 150–400)
RBC: 3.92 MIL/uL (ref 3.87–5.11)
RDW: 15.5 % (ref 11.5–15.5)
WBC: 8.5 10*3/uL (ref 4.0–10.5)

## 2014-11-16 LAB — CK TOTAL AND CKMB (NOT AT ARMC)
CK TOTAL: 27 U/L — AB (ref 38–234)
CK, MB: 1.6 ng/mL (ref 0.5–5.0)
Relative Index: INVALID (ref 0.0–2.5)

## 2014-11-19 LAB — CBC
HCT: 37 % (ref 36.0–46.0)
Hemoglobin: 11.3 g/dL — ABNORMAL LOW (ref 12.0–15.0)
MCH: 27.5 pg (ref 26.0–34.0)
MCHC: 30.5 g/dL (ref 30.0–36.0)
MCV: 90 fL (ref 78.0–100.0)
PLATELETS: 268 10*3/uL (ref 150–400)
RBC: 4.11 MIL/uL (ref 3.87–5.11)
RDW: 16 % — AB (ref 11.5–15.5)
WBC: 7.7 10*3/uL (ref 4.0–10.5)

## 2014-11-19 LAB — BASIC METABOLIC PANEL
Anion gap: 10 (ref 5–15)
BUN: 10 mg/dL (ref 6–20)
CHLORIDE: 92 mmol/L — AB (ref 101–111)
CO2: 36 mmol/L — AB (ref 22–32)
CREATININE: 0.73 mg/dL (ref 0.44–1.00)
Calcium: 9.4 mg/dL (ref 8.9–10.3)
GFR calc Af Amer: >60 mL/min (ref >60)
GFR calc non Af Amer: >60 mL/min (ref >60)
Glucose, Bld: 111 mg/dL — ABNORMAL HIGH (ref 65–99)
POTASSIUM: 2.9 mmol/L — AB (ref 3.5–5.1)
SODIUM: 138 mmol/L (ref 135–145)

## 2014-11-20 LAB — POTASSIUM: POTASSIUM: 2.9 mmol/L — AB (ref 3.5–5.1)

## 2014-11-23 LAB — CBC WITH DIFFERENTIAL/PLATELET
BASOS ABS: 0 10*3/uL (ref 0.0–0.1)
BASOS PCT: 1 %
EOS ABS: 0.1 10*3/uL (ref 0.0–0.7)
EOS PCT: 2 %
HCT: 34.1 % — ABNORMAL LOW (ref 36.0–46.0)
Hemoglobin: 10.6 g/dL — ABNORMAL LOW (ref 12.0–15.0)
LYMPHS PCT: 21 %
Lymphs Abs: 1.3 10*3/uL (ref 0.7–4.0)
MCH: 27.7 pg (ref 26.0–34.0)
MCHC: 31.1 g/dL (ref 30.0–36.0)
MCV: 89 fL (ref 78.0–100.0)
Monocytes Absolute: 0.5 10*3/uL (ref 0.1–1.0)
Monocytes Relative: 8 %
Neutro Abs: 4.1 10*3/uL (ref 1.7–7.7)
Neutrophils Relative %: 68 %
PLATELETS: 204 10*3/uL (ref 150–400)
RBC: 3.83 MIL/uL — AB (ref 3.87–5.11)
RDW: 15.7 % — ABNORMAL HIGH (ref 11.5–15.5)
WBC: 6 10*3/uL (ref 4.0–10.5)

## 2014-11-23 LAB — BASIC METABOLIC PANEL
ANION GAP: 7 (ref 5–15)
BUN: 11 mg/dL (ref 6–20)
CALCIUM: 8.2 mg/dL — AB (ref 8.9–10.3)
CO2: 32 mmol/L (ref 22–32)
Chloride: 100 mmol/L — ABNORMAL LOW (ref 101–111)
Creatinine, Ser: 0.63 mg/dL (ref 0.44–1.00)
Glucose, Bld: 111 mg/dL — ABNORMAL HIGH (ref 65–99)
POTASSIUM: 2.7 mmol/L — AB (ref 3.5–5.1)
SODIUM: 139 mmol/L (ref 135–145)

## 2014-11-24 LAB — BASIC METABOLIC PANEL
ANION GAP: 7 (ref 5–15)
BUN: 13 mg/dL (ref 6–20)
CALCIUM: 9.4 mg/dL (ref 8.9–10.3)
CO2: 32 mmol/L (ref 22–32)
Chloride: 99 mmol/L — ABNORMAL LOW (ref 101–111)
Creatinine, Ser: 0.73 mg/dL (ref 0.44–1.00)
GFR calc Af Amer: >60 mL/min (ref >60)
GFR calc non Af Amer: >60 mL/min (ref >60)
GLUCOSE: 122 mg/dL — AB (ref 65–99)
Potassium: 3.9 mmol/L (ref 3.5–5.1)
Sodium: 138 mmol/L (ref 135–145)

## 2014-11-24 LAB — MAGNESIUM: Magnesium: 2.3 mg/dL (ref 1.7–2.4)

## 2014-12-02 LAB — BASIC METABOLIC PANEL
ANION GAP: 7 (ref 5–15)
BUN: 8 mg/dL (ref 6–20)
CALCIUM: 9.3 mg/dL (ref 8.9–10.3)
CO2: 32 mmol/L (ref 22–32)
CREATININE: 0.66 mg/dL (ref 0.44–1.00)
Chloride: 101 mmol/L (ref 101–111)
GFR calc Af Amer: >60 mL/min (ref >60)
GLUCOSE: 115 mg/dL — AB (ref 65–99)
Potassium: 3.9 mmol/L (ref 3.5–5.1)
Sodium: 140 mmol/L (ref 135–145)

## 2014-12-02 LAB — CBC
HEMATOCRIT: 35 % — AB (ref 36.0–46.0)
Hemoglobin: 10.7 g/dL — ABNORMAL LOW (ref 12.0–15.0)
MCH: 27.4 pg (ref 26.0–34.0)
MCHC: 30.6 g/dL (ref 30.0–36.0)
MCV: 89.7 fL (ref 78.0–100.0)
PLATELETS: 149 10*3/uL — AB (ref 150–400)
RBC: 3.9 MIL/uL (ref 3.87–5.11)
RDW: 15.9 % — AB (ref 11.5–15.5)
WBC: 4.9 10*3/uL (ref 4.0–10.5)

## 2014-12-02 LAB — CK TOTAL AND CKMB (NOT AT ARMC)
CK, MB: 1.8 ng/mL (ref 0.5–5.0)
RELATIVE INDEX: INVALID (ref 0.0–2.5)
Total CK: 52 U/L (ref 38–234)

## 2014-12-06 LAB — CBC
HEMATOCRIT: 34.6 % — AB (ref 36.0–46.0)
Hemoglobin: 11 g/dL — ABNORMAL LOW (ref 12.0–15.0)
MCH: 28.3 pg (ref 26.0–34.0)
MCHC: 31.8 g/dL (ref 30.0–36.0)
MCV: 88.9 fL (ref 78.0–100.0)
PLATELETS: 125 10*3/uL — AB (ref 150–400)
RBC: 3.89 MIL/uL (ref 3.87–5.11)
RDW: 15.8 % — AB (ref 11.5–15.5)
WBC: 4.9 10*3/uL (ref 4.0–10.5)

## 2014-12-06 LAB — BASIC METABOLIC PANEL
ANION GAP: 11 (ref 5–15)
BUN: 12 mg/dL (ref 6–20)
CALCIUM: 9.2 mg/dL (ref 8.9–10.3)
CO2: 29 mmol/L (ref 22–32)
Chloride: 98 mmol/L — ABNORMAL LOW (ref 101–111)
Creatinine, Ser: 0.78 mg/dL (ref 0.44–1.00)
GLUCOSE: 94 mg/dL (ref 65–99)
POTASSIUM: 3.6 mmol/L (ref 3.5–5.1)
Sodium: 138 mmol/L (ref 135–145)

## 2014-12-08 LAB — BASIC METABOLIC PANEL
ANION GAP: 7 (ref 5–15)
BUN: 11 mg/dL (ref 6–20)
CALCIUM: 9.7 mg/dL (ref 8.9–10.3)
CO2: 31 mmol/L (ref 22–32)
CREATININE: 0.67 mg/dL (ref 0.44–1.00)
Chloride: 105 mmol/L (ref 101–111)
GFR calc Af Amer: >60 mL/min (ref >60)
GLUCOSE: 101 mg/dL — AB (ref 65–99)
POTASSIUM: 3.9 mmol/L (ref 3.5–5.1)
Sodium: 143 mmol/L (ref 135–145)

## 2014-12-10 ENCOUNTER — Other Ambulatory Visit (HOSPITAL_COMMUNITY): Payer: Self-pay

## 2014-12-11 ENCOUNTER — Other Ambulatory Visit (HOSPITAL_COMMUNITY): Payer: Medicare Other

## 2014-12-11 ENCOUNTER — Other Ambulatory Visit (HOSPITAL_COMMUNITY): Payer: Self-pay

## 2014-12-11 LAB — BASIC METABOLIC PANEL
ANION GAP: 8 (ref 5–15)
BUN: 10 mg/dL (ref 6–20)
CHLORIDE: 102 mmol/L (ref 101–111)
CO2: 32 mmol/L (ref 22–32)
Calcium: 9.5 mg/dL (ref 8.9–10.3)
Creatinine, Ser: 0.73 mg/dL (ref 0.44–1.00)
Glucose, Bld: 109 mg/dL — ABNORMAL HIGH (ref 65–99)
POTASSIUM: 4 mmol/L (ref 3.5–5.1)
SODIUM: 142 mmol/L (ref 135–145)

## 2014-12-11 LAB — CBC WITH DIFFERENTIAL/PLATELET
BASOS ABS: 0 10*3/uL (ref 0.0–0.1)
Basophils Relative: 0 %
EOS ABS: 0.1 10*3/uL (ref 0.0–0.7)
EOS PCT: 3 %
HCT: 36.7 % (ref 36.0–46.0)
HEMOGLOBIN: 11.4 g/dL — AB (ref 12.0–15.0)
Lymphocytes Relative: 32 %
Lymphs Abs: 1.7 10*3/uL (ref 0.7–4.0)
MCH: 28.1 pg (ref 26.0–34.0)
MCHC: 31.1 g/dL (ref 30.0–36.0)
MCV: 90.4 fL (ref 78.0–100.0)
Monocytes Absolute: 0.5 10*3/uL (ref 0.1–1.0)
Monocytes Relative: 10 %
NEUTROS PCT: 55 %
Neutro Abs: 3 10*3/uL (ref 1.7–7.7)
PLATELETS: 121 10*3/uL — AB (ref 150–400)
RBC: 4.06 MIL/uL (ref 3.87–5.11)
RDW: 15.5 % (ref 11.5–15.5)
WBC: 5.3 10*3/uL (ref 4.0–10.5)

## 2014-12-11 LAB — C-REACTIVE PROTEIN: CRP: 0.8 mg/dL (ref <1.0)

## 2014-12-12 ENCOUNTER — Inpatient Hospital Stay
Admission: AD | Admit: 2014-12-12 | Discharge: 2014-12-26 | Disposition: A | Discharge status: Skilled Nursing Facility | Payer: Self-pay | Source: Ambulatory Visit | Attending: Internal Medicine | Admitting: Internal Medicine

## 2014-12-12 ENCOUNTER — Emergency Department (HOSPITAL_COMMUNITY)
Admission: EM | Admit: 2014-12-12 | Discharge: 2014-12-12 | Disposition: A | Discharge status: Home / Self Care | Payer: Medicare Other | Attending: Emergency Medicine | Admitting: Emergency Medicine

## 2014-12-12 ENCOUNTER — Encounter (HOSPITAL_COMMUNITY): Payer: Self-pay

## 2014-12-12 DIAGNOSIS — G894 Chronic pain syndrome: Secondary | ICD-10-CM | POA: Diagnosis not present

## 2014-12-12 DIAGNOSIS — E119 Type 2 diabetes mellitus without complications: Secondary | ICD-10-CM | POA: Diagnosis not present

## 2014-12-12 DIAGNOSIS — S60221A Contusion of right hand, initial encounter: Secondary | ICD-10-CM | POA: Diagnosis not present

## 2014-12-12 DIAGNOSIS — Y939 Activity, unspecified: Secondary | ICD-10-CM | POA: Insufficient documentation

## 2014-12-12 DIAGNOSIS — Z79899 Other long term (current) drug therapy: Secondary | ICD-10-CM | POA: Diagnosis not present

## 2014-12-12 DIAGNOSIS — S62511A Displaced fracture of proximal phalanx of right thumb, initial encounter for closed fracture: Secondary | ICD-10-CM | POA: Diagnosis not present

## 2014-12-12 DIAGNOSIS — F329 Major depressive disorder, single episode, unspecified: Secondary | ICD-10-CM | POA: Diagnosis not present

## 2014-12-12 DIAGNOSIS — F419 Anxiety disorder, unspecified: Secondary | ICD-10-CM | POA: Insufficient documentation

## 2014-12-12 DIAGNOSIS — M069 Rheumatoid arthritis, unspecified: Secondary | ICD-10-CM | POA: Diagnosis not present

## 2014-12-12 DIAGNOSIS — Y929 Unspecified place or not applicable: Secondary | ICD-10-CM | POA: Diagnosis not present

## 2014-12-12 DIAGNOSIS — Z7952 Long term (current) use of systemic steroids: Secondary | ICD-10-CM | POA: Insufficient documentation

## 2014-12-12 DIAGNOSIS — G40909 Epilepsy, unspecified, not intractable, without status epilepticus: Secondary | ICD-10-CM | POA: Insufficient documentation

## 2014-12-12 DIAGNOSIS — Y999 Unspecified external cause status: Secondary | ICD-10-CM | POA: Diagnosis not present

## 2014-12-12 DIAGNOSIS — Z792 Long term (current) use of antibiotics: Secondary | ICD-10-CM | POA: Insufficient documentation

## 2014-12-12 DIAGNOSIS — Z23 Encounter for immunization: Secondary | ICD-10-CM | POA: Insufficient documentation

## 2014-12-12 DIAGNOSIS — W050XXA Fall from non-moving wheelchair, initial encounter: Secondary | ICD-10-CM | POA: Diagnosis not present

## 2014-12-12 DIAGNOSIS — Z79891 Long term (current) use of opiate analgesic: Secondary | ICD-10-CM | POA: Diagnosis not present

## 2014-12-12 DIAGNOSIS — G43909 Migraine, unspecified, not intractable, without status migrainosus: Secondary | ICD-10-CM | POA: Insufficient documentation

## 2014-12-12 DIAGNOSIS — Z880 Allergy status to penicillin: Secondary | ICD-10-CM | POA: Diagnosis not present

## 2014-12-12 DIAGNOSIS — K219 Gastro-esophageal reflux disease without esophagitis: Secondary | ICD-10-CM | POA: Diagnosis not present

## 2014-12-12 DIAGNOSIS — S62501A Fracture of unspecified phalanx of right thumb, initial encounter for closed fracture: Secondary | ICD-10-CM

## 2014-12-12 DIAGNOSIS — I10 Essential (primary) hypertension: Secondary | ICD-10-CM | POA: Diagnosis not present

## 2014-12-12 DIAGNOSIS — S60511A Abrasion of right hand, initial encounter: Secondary | ICD-10-CM | POA: Diagnosis not present

## 2014-12-12 DIAGNOSIS — Z8619 Personal history of other infectious and parasitic diseases: Secondary | ICD-10-CM | POA: Diagnosis not present

## 2014-12-12 DIAGNOSIS — Z862 Personal history of diseases of the blood and blood-forming organs and certain disorders involving the immune mechanism: Secondary | ICD-10-CM | POA: Insufficient documentation

## 2014-12-12 DIAGNOSIS — Z853 Personal history of malignant neoplasm of breast: Secondary | ICD-10-CM | POA: Diagnosis not present

## 2014-12-12 DIAGNOSIS — S6991XA Unspecified injury of right wrist, hand and finger(s), initial encounter: Secondary | ICD-10-CM | POA: Diagnosis present

## 2014-12-12 DIAGNOSIS — Z872 Personal history of diseases of the skin and subcutaneous tissue: Secondary | ICD-10-CM | POA: Diagnosis not present

## 2014-12-12 DIAGNOSIS — M542 Cervicalgia: Secondary | ICD-10-CM

## 2014-12-12 MED ORDER — BACITRACIN ZINC 500 UNIT/GM EX OINT: 1.0000 "application " | TOPICAL_OINTMENT | Freq: Two times a day (BID) | CUTANEOUS | Status: DC

## 2014-12-12 MED ORDER — TETANUS-DIPHTH-ACELL PERTUSSIS 5-2.5-18.5 LF-MCG/0.5 IM SUSP
0.5000 mL | Freq: Once | INTRAMUSCULAR | Status: AC
  Administered 2014-12-12: 0.5 mL via INTRAMUSCULAR
  Filled 2014-12-12: qty 0.5

## 2014-12-12 NOTE — ED Notes (Signed)
MD Campos at bedside.  

## 2014-12-12 NOTE — ED Notes (Signed)
Called for triage x2

## 2014-12-12 NOTE — ED Provider Notes (Signed)
CSN: [id number]     Arrival date & time 12/12/14  1146 History   None    Chief Complaint  Patient presents with  . Hand Injury     (Consider location/radiation/quality/duration/timing/severity/associated sxs/prior Treatment) The history is provided by the patient and medical records. No language interpreter was used.     [name] Carr is a 59 y.o. female  with long medical history including diabetes presents to the Emergency Department complaining of acute abrasion to the dorsum of the right hand with associated swelling after falling from her wheelchair yesterday. Patient reports she fell onto her right hand and wrist. She reports previous injury to the right thumb with long-standing dislocation deformity for greater than 2 years.  Patient is [facility] for in the [facility].  Unknown last tetanus.  Movement and palpation of the dorsum of the hand exacerbate her pain somewhat. Nothing makes it better.   Past Medical History  Diagnosis Date  . PERSONALITY DISORDER   . Chronic pain syndrome     chronic narcotics, dependancy hx - [place] for pain mgmt  . MIGRAINE HEADACHE   . Lyme disease   . ALLERGIC RHINITIS   . ANEMIA-IRON DEFICIENCY   . HYPERLIPIDEMIA   . GERD   . DIABETES MELLITUS, TYPE II   . DEPRESSION   . SLE (systemic lupus erythematosus) (HCC)     [facility] - chronic pred  . Psoriasis   . Addison's disease ([place])   . Degenerative joint disease   . DDD (degenerative disc disease)   . Phlebitis after infusion     left ?calf; "S/P phenergan/demerol injection"  . Blood clot in vein 2011; 02/04/11    "right jugular vein;left jugular vein"  . SEIZURE DISORDER     "lots of seizures; due to lupus"  . Anxiety   . Neuropathy (HCC)     hands, feet, due to diabetes & back problem  . Surgical wound infection 02/15/2011    R shoulder and mastectomy site - related to PICC  . CARCINOMA, BREAST 08/2009 dx    R breast,  mets to liver - resolution s/p chemo, s/p  B mastect  . Metastases to the liver ([place])   . RA (rheumatoid arthritis) ([place])   . Hypothyroidism pt states she has never had hy pothyroidism  . Hypertension    Past Surgical History  Procedure Laterality Date  . Liver bopsy  09/2009  . Port a cath placement  09/2009; 11/19/10  . Port-a-cath removal  04/2010    "due to staph & strept"  . Port-a-cath removal  12/23/2010    Procedure: REMOVAL PORT-A-CATH;  Surgeon: Pedro Earls, MD;  Location: [facility];  Service: General;  Laterality: Left;  . Breast surgery  09/2009    right breast biopsy  . Mastectomy  11/19/10    bilateral mastectomy   . I&d extremity  ~ 2010    right hand; S/P "dog bite"  . Peripherally inserted central catheter insertion    . Spider bite    . Incision and drainage abscess Left 12/22/2013    Procedure: INCISION AND DRAINAGE ABSCESS OF LEFT THIGH;  Surgeon: Donnie Mesa, MD;  Location: [facility];  Service: General;  Laterality: Left;  . I&d extremity Left 05/09/2014    Procedure: IRRIGATION AND DEBRIDEMENT LEFT THIGH ABSCESS;  Surgeon: Meredith Pel, MD;  Location: [facility];  Service: Orthopedics;  Laterality: Left;  . I&d extremity Left 05/23/2014    Procedure: IRRIGATION AND DEBRIDEMENT  EXTREMITY;  Surgeon: Meredith Pel, MD;  Location: [facility];  Service: Orthopedics;  Laterality: Left;  . Application of wound vac Left 05/23/2014    Procedure: APPLICATION OF WOUND VAC;  Surgeon: Meredith Pel, MD;  Location: [facility];  Service: Orthopedics;  Laterality: Left;   Family History  Problem Relation Age of Onset  . Lung cancer Mother   . Lung cancer Father   . Arthritis Other   . Diabetes Other   . Hyperlipidemia Other    Social History  Substance Use Topics  . Smoking status: Never Smoker   . Smokeless tobacco: Never Used     Comment: Never used tobacco  . Alcohol Use: No   OB History    No data available     Review of Systems  Constitutional: Negative for fever, diaphoresis, appetite change, fatigue  and unexpected weight change.  HENT: Negative for mouth sores.   Eyes: Negative for visual disturbance.  Respiratory: Negative for cough, chest tightness, shortness of breath and wheezing.   Cardiovascular: Negative for chest pain.  Gastrointestinal: Negative for nausea, vomiting, abdominal pain, diarrhea and constipation.  Endocrine: Negative for polydipsia, polyphagia and polyuria.  Genitourinary: Negative for dysuria, urgency, frequency and hematuria.  Musculoskeletal: Positive for arthralgias (right hand). Negative for back pain and neck stiffness.  Skin: Positive for wound. Negative for rash.  Allergic/Immunologic: Negative for immunocompromised state.  Neurological: Negative for syncope, light-headedness and headaches.  Hematological: Does not bruise/bleed easily.  Psychiatric/Behavioral: Negative for sleep disturbance. The patient is not nervous/anxious.       Allergies  Ammonia; Contrast media; Iohexol; Methotrexate; Midazolam hcl; Nalbuphine; Bee venom; Hydroxychloroquine; Infliximab; Ketorolac tromethamine; Morphine and related; Nsaids; Penicillins; Pentazocine; Rofecoxib; Tetracyclines & related; and Celecoxib  Home Medications   Prior to Admission medications   Medication Sig Start Date End Date Taking? Authorizing Provider  clonazePAM (KLONOPIN) 1 MG tablet Take one tablet by mouth twice daily Patient taking differently: Take 0.5 mg by mouth every 8 (eight) hours.  08/28/13  Yes Estill Dooms, MD  DULoxetine (CYMBALTA) 60 MG capsule Take 1 capsule (60 mg total) by mouth 2 (two) times daily. 06/11/12  Yes Rowe Clack, MD  furosemide (LASIX) 20 MG tablet Take 20 mg by mouth every morning.   Yes Historical Provider, MD  acetaminophen (TYLENOL) 500 MG tablet Take 1 tablet (500 mg total) by mouth every 6 (six) hours as needed for mild pain or fever. Total daily tylenol dose from all medications should not exceed 4 gm. Please follow up with your pain MD. 05/14/14   Modena Jansky, MD  bacitracin ointment Apply 1 application topically 2 (two) times daily. 12/12/14   Anayla Giannetti, PA-C  cefTRIAXone (ROCEPHIN) 1 G injection Inject 1 g into the muscle.    Historical Provider, MD  docusate sodium (COLACE) 100 MG capsule Take 200 mg by mouth 2 (two) times daily.     Historical Provider, MD  fentaNYL (DURAGESIC - DOSED MCG/HR) 12 MCG/HR APPLY 1 PATCH EVERY 72 HOURS 07/28/14   Historical Provider, MD  fluconazole (DIFLUCAN) 100 MG tablet TAKE 1 TABLET BY MOUTH DAILY 10/13/14   Volanda Napoleon, MD  gabapentin (NEURONTIN) 800 MG tablet Take 800 mg by mouth 3 (three) times daily.    Historical Provider, MD  hydrochlorothiazide (HYDRODIURIL) 25 MG tablet Take 25 mg by mouth daily.     Historical Provider, MD  HYDROcodone-acetaminophen (NORCO) 10-325 MG per tablet Take 1 tablet by mouth every 4 (four) hours  as needed for moderate pain or severe pain. 12/27/13   Ripudeep Krystal Eaton, MD  meclizine (ANTIVERT) 12.5 MG tablet TK 1 T PO TID PRN 07/15/14   Historical Provider, MD  ondansetron (ZOFRAN) 4 MG tablet Take 1 tablet (4 mg total) by mouth every 6 (six) hours as needed for nausea. 05/28/14   Geradine Girt, DO  pantoprazole (PROTONIX) 20 MG tablet Take 20 mg by mouth daily.    Historical Provider, MD  potassium chloride SA (K-DUR,KLOR-CON) 20 MEQ tablet 20 MEQ daily Patient taking differently: Take 20 mEq by mouth 2 (two) times daily. 20 MEQ daily 05/14/14   Modena Jansky, MD  predniSONE (DELTASONE) 5 MG tablet Take 10 mg by mouth daily with breakfast.     Historical Provider, MD  PRESCRIPTION MEDICATION Take 1 tablet by mouth daily. Supportive therapy [place]    Historical Provider, MD  promethazine (PHENERGAN) 25 MG suppository Place 25 mg rectally every 6 (six) hours as needed for nausea or vomiting.    Historical Provider, MD  rOPINIRole (REQUIP) 1 MG tablet Take 2 mg by mouth at bedtime. TAKES A 2 MG TAB    Historical Provider, MD  senna-docusate (SENOKOT-S) 8.6-50 MG per  tablet Take 1 tablet by mouth 2 (two) times daily as needed for mild constipation.     Historical Provider, MD  spironolactone (ALDACTONE) 25 MG tablet Take 25 mg by mouth daily.    Historical Provider, MD  tiZANidine (ZANAFLEX) 4 MG tablet Take 2-4 mg by mouth every 6 (six) hours as needed for muscle spasms.     Historical Provider, MD  traZODone (DESYREL) 150 MG tablet Take 150-300 mg by mouth at bedtime.    Historical Provider, MD  valACYclovir (VALTREX) 1000 MG tablet Take 1,000 mg by mouth 3 (three) times daily.    Historical Provider, MD   BP 123/71 mmHg  Pulse 59  Temp(Src) 98.9 F (37.2 C) (Oral)  Resp 17  SpO2 98% Physical Exam  Constitutional: She is oriented to person, place, and time. She appears well-developed and well-nourished. No distress.  Awake, alert, nontoxic appearance  HENT:  Head: Normocephalic and atraumatic.  Mouth/Throat: Oropharynx is clear and moist. No oropharyngeal exudate.  Eyes: Conjunctivae are normal. No scleral icterus.  Neck:  c-collar in place  Cardiovascular: Normal rate, regular rhythm, normal heart sounds and intact distal pulses.   No murmur heard. Capillary refill < 3 sec  Pulmonary/Chest: Effort normal and breath sounds normal. No respiratory distress. She has no wheezes.  Equal chest expansion  Abdominal: Soft. Bowel sounds are normal. She exhibits no mass. There is no tenderness. There is no rebound and no guarding.  Musculoskeletal: Normal range of motion. She exhibits no edema.  ROM: FROM of fingers 2-5 Dislocation of the right MCP of the thumb - pt reports this is chronic; she has movement of the DIP without pain; no TTP of the dislocation site  Neurological: She is alert and oriented to person, place, and time.  Sensation: decreased to all fingers at baseline Strength: 4/5 grip strength at baseline  Skin: Skin is warm and dry. She is not diaphoretic.  Abrasion to the MCP of the pointer finger and the dorsum of the hand  Psychiatric:  She has a normal mood and affect.  Nursing note and vitals reviewed.   ED Course  Procedures (including critical care time) Labs Review Labs Reviewed - No data to display  Imaging Review Dg Wrist 2 Views Right  12/11/2014  CLINICAL DATA:  Fall today. Wrist injury and pain. Initial encounter. EXAM: RIGHT WRIST - 2 VIEW COMPARISON:  None. FINDINGS: There is no evidence of fracture or dislocation. There is no evidence of arthropathy or other focal bone abnormality involving the wrist. Dislocation of the thumb at the metacarpophalangeal joint is noted. IMPRESSION: No fracture or dislocation involving the wrist. Thumb dislocation at MCP joint. See results of right hand radiographs reported separately. Electronically Signed   By: Earle Gell M.D.   On: 12/11/2014 12:44   Dg Hand Complete Right  12/11/2014  CLINICAL DATA:  Fall onto hand today. Right hand pain and thumb deformity. Initial encounter. EXAM: RIGHT HAND - COMPLETE 3+ VIEW COMPARISON:  None. FINDINGS: Dislocation of the thumb is seen at the metacarpophalangeal joint. Tiny ossific density adjacent to the base the proximal phalanx is consistent with a tiny avulsion fracture fragment. No other fractures identified . IMPRESSION: Dislocation of thumb at MCP joint, with tiny avulsion fracture fragment adjacent to base of proximal phalanx. Electronically Signed   By: Earle Gell M.D.   On: 12/11/2014 12:41   I have personally reviewed and evaluated these images and lab results as part of my medical decision-making.   EKG Interpretation None      MDM   Final diagnoses:  Fall from wheelchair, initial encounter  Contusion of right hand, initial encounter  Avulsion fracture of thumb, right, closed, initial encounter   [name] Carr presents with right hand injury after fall yesterday.  Review of the image shows tiny avulsion fragment adjacent to the base of the proximal phalanx along with dislocation of the thumb at the MCP joint.   Dislocation is an old injury and chronic for the patient. Avulsion fracture may be new as patient does have a minimal amount of pain at the site on palpation.  She has baseline range of motion in the right thumb.  Sensation and grip strength are decreased at baseline as well due to spinal cord surgery several months ago.  Wound cleaned. Tetanus updated.  Prescription for bacitracin written. [facility]    The patient was discussed with and seen by Dr. Venora Maples who agrees with the treatment plan.  BP 123/71 mmHg  Pulse 59  Temp(Src) 98.9 F (37.2 C) (Oral)  Resp 17  SpO2 98%    Abigail Butts, PA-C 12/12/14 [place], MD 12/12/14 1559

## 2014-12-12 NOTE — ED Notes (Signed)
[facility] she had yesterday. Pt. Fell onto right hand/wrist and hip. Pt. Hx of previous right hand injury with thumb deformity. Pt. Presents with swelling and bruising of right hand/fingers. Admitted for surgery to removed 2 abscesses on spinal cord 3 months ago, per pt.

## 2014-12-12 NOTE — Discharge Instructions (Signed)
1. Medications: bacitracin BID, usual home medications 2. Treatment: rest, drink plenty of fluids, keep wound clean twice per day with warm soap and water; apply bacitracin 3. Follow Up: Please followup with hand surgery as needed after discharge.

## 2014-12-12 NOTE — ED Notes (Signed)
Report called back to [facility], Therapist, sports at [facility].

## 2014-12-13 LAB — CK: CK TOTAL: 29 U/L — AB (ref 38–234)

## 2014-12-15 LAB — CBC WITH DIFFERENTIAL/PLATELET
Basophils Absolute: 0 10*3/uL (ref 0.0–0.1)
Basophils Relative: 0 %
EOS ABS: 0.1 10*3/uL (ref 0.0–0.7)
EOS PCT: 3 %
HCT: 35.8 % — ABNORMAL LOW (ref 36.0–46.0)
Hemoglobin: 11.2 g/dL — ABNORMAL LOW (ref 12.0–15.0)
LYMPHS ABS: 1.6 10*3/uL (ref 0.7–4.0)
Lymphocytes Relative: 31 %
MCH: 28 pg (ref 26.0–34.0)
MCHC: 31.3 g/dL (ref 30.0–36.0)
MCV: 89.5 fL (ref 78.0–100.0)
MONOS PCT: 11 %
Monocytes Absolute: 0.6 10*3/uL (ref 0.1–1.0)
Neutro Abs: 2.9 10*3/uL (ref 1.7–7.7)
Neutrophils Relative %: 55 %
PLATELETS: 167 10*3/uL (ref 150–400)
RBC: 4 MIL/uL (ref 3.87–5.11)
RDW: 15.3 % (ref 11.5–15.5)
WBC: 5.3 10*3/uL (ref 4.0–10.5)

## 2014-12-15 LAB — COMPREHENSIVE METABOLIC PANEL
ALT: 35 U/L (ref 14–54)
AST: 22 U/L (ref 15–41)
Albumin: 3.2 g/dL — ABNORMAL LOW (ref 3.5–5.0)
Alkaline Phosphatase: 67 U/L (ref 38–126)
Anion gap: 9 (ref 5–15)
BUN: 10 mg/dL (ref 6–20)
CHLORIDE: 101 mmol/L (ref 101–111)
CO2: 30 mmol/L (ref 22–32)
Calcium: 9.2 mg/dL (ref 8.9–10.3)
Creatinine, Ser: 0.75 mg/dL (ref 0.44–1.00)
Glucose, Bld: 104 mg/dL — ABNORMAL HIGH (ref 65–99)
POTASSIUM: 3.9 mmol/L (ref 3.5–5.1)
SODIUM: 140 mmol/L (ref 135–145)
Total Bilirubin: 0.3 mg/dL (ref 0.3–1.2)
Total Protein: 6.3 g/dL — ABNORMAL LOW (ref 6.5–8.1)

## 2014-12-15 LAB — C-REACTIVE PROTEIN: CRP: 1.5 mg/dL — AB (ref <1.0)

## 2014-12-15 LAB — SEDIMENTATION RATE: SED RATE: 21 mm/h (ref 0–22)

## 2014-12-17 ENCOUNTER — Other Ambulatory Visit (HOSPITAL_COMMUNITY): Payer: Medicare Other

## 2014-12-20 LAB — BASIC METABOLIC PANEL
ANION GAP: 6 (ref 5–15)
BUN: 11 mg/dL (ref 6–20)
CALCIUM: 9.1 mg/dL (ref 8.9–10.3)
CO2: 34 mmol/L — ABNORMAL HIGH (ref 22–32)
CREATININE: 0.77 mg/dL (ref 0.44–1.00)
Chloride: 102 mmol/L (ref 101–111)
GLUCOSE: 105 mg/dL — AB (ref 65–99)
Potassium: 3.7 mmol/L (ref 3.5–5.1)
Sodium: 142 mmol/L (ref 135–145)

## 2014-12-20 LAB — CBC
HCT: 35.1 % — ABNORMAL LOW (ref 36.0–46.0)
Hemoglobin: 10.8 g/dL — ABNORMAL LOW (ref 12.0–15.0)
MCH: 27.3 pg (ref 26.0–34.0)
MCHC: 30.8 g/dL (ref 30.0–36.0)
MCV: 88.6 fL (ref 78.0–100.0)
PLATELETS: 155 10*3/uL (ref 150–400)
RBC: 3.96 MIL/uL (ref 3.87–5.11)
RDW: 15.2 % (ref 11.5–15.5)
WBC: 5.2 10*3/uL (ref 4.0–10.5)

## 2014-12-26 LAB — CBC
HEMATOCRIT: 33.2 % — AB (ref 36.0–46.0)
HEMOGLOBIN: 10.5 g/dL — AB (ref 12.0–15.0)
MCH: 28.3 pg (ref 26.0–34.0)
MCHC: 31.6 g/dL (ref 30.0–36.0)
MCV: 89.5 fL (ref 78.0–100.0)
Platelets: 131 10*3/uL — ABNORMAL LOW (ref 150–400)
RBC: 3.71 MIL/uL — AB (ref 3.87–5.11)
RDW: 15 % (ref 11.5–15.5)
WBC: 4.3 10*3/uL (ref 4.0–10.5)

## 2014-12-26 LAB — BASIC METABOLIC PANEL
ANION GAP: 8 (ref 5–15)
BUN: 9 mg/dL (ref 6–20)
CALCIUM: 9.4 mg/dL (ref 8.9–10.3)
CHLORIDE: 104 mmol/L (ref 101–111)
CO2: 30 mmol/L (ref 22–32)
Creatinine, Ser: 0.83 mg/dL (ref 0.44–1.00)
GFR calc non Af Amer: >60 mL/min (ref >60)
Glucose, Bld: 95 mg/dL (ref 65–99)
Potassium: 3.7 mmol/L (ref 3.5–5.1)
Sodium: 142 mmol/L (ref 135–145)

## 2015-01-08 LAB — PROTIME-INR: Protime: 12.9 seconds (ref 10.0–13.8)

## 2015-01-08 LAB — POCT INR: INR: 1 (ref 0.9–1.1)

## 2015-04-14 ENCOUNTER — Emergency Department (HOSPITAL_BASED_OUTPATIENT_CLINIC_OR_DEPARTMENT_OTHER)
Admission: EM | Admit: 2015-04-14 | Discharge: 2015-04-14 | Disposition: A | Discharge status: Home / Self Care | Payer: Medicare Other | Attending: Emergency Medicine | Admitting: Emergency Medicine

## 2015-04-14 ENCOUNTER — Encounter (HOSPITAL_BASED_OUTPATIENT_CLINIC_OR_DEPARTMENT_OTHER): Payer: Self-pay | Admitting: Emergency Medicine

## 2015-04-14 DIAGNOSIS — F329 Major depressive disorder, single episode, unspecified: Secondary | ICD-10-CM | POA: Insufficient documentation

## 2015-04-14 DIAGNOSIS — Y998 Other external cause status: Secondary | ICD-10-CM | POA: Diagnosis not present

## 2015-04-14 DIAGNOSIS — G40909 Epilepsy, unspecified, not intractable, without status epilepticus: Secondary | ICD-10-CM | POA: Insufficient documentation

## 2015-04-14 DIAGNOSIS — Z872 Personal history of diseases of the skin and subcutaneous tissue: Secondary | ICD-10-CM | POA: Diagnosis not present

## 2015-04-14 DIAGNOSIS — Z792 Long term (current) use of antibiotics: Secondary | ICD-10-CM | POA: Diagnosis not present

## 2015-04-14 DIAGNOSIS — Z79899 Other long term (current) drug therapy: Secondary | ICD-10-CM | POA: Insufficient documentation

## 2015-04-14 DIAGNOSIS — Z880 Allergy status to penicillin: Secondary | ICD-10-CM | POA: Diagnosis not present

## 2015-04-14 DIAGNOSIS — Z8505 Personal history of malignant neoplasm of liver: Secondary | ICD-10-CM | POA: Diagnosis not present

## 2015-04-14 DIAGNOSIS — M109 Gout, unspecified: Secondary | ICD-10-CM | POA: Diagnosis not present

## 2015-04-14 DIAGNOSIS — Z862 Personal history of diseases of the blood and blood-forming organs and certain disorders involving the immune mechanism: Secondary | ICD-10-CM | POA: Insufficient documentation

## 2015-04-14 DIAGNOSIS — Z8619 Personal history of other infectious and parasitic diseases: Secondary | ICD-10-CM | POA: Insufficient documentation

## 2015-04-14 DIAGNOSIS — Z7952 Long term (current) use of systemic steroids: Secondary | ICD-10-CM | POA: Diagnosis not present

## 2015-04-14 DIAGNOSIS — F419 Anxiety disorder, unspecified: Secondary | ICD-10-CM | POA: Insufficient documentation

## 2015-04-14 DIAGNOSIS — I10 Essential (primary) hypertension: Secondary | ICD-10-CM | POA: Diagnosis not present

## 2015-04-14 DIAGNOSIS — G629 Polyneuropathy, unspecified: Secondary | ICD-10-CM | POA: Insufficient documentation

## 2015-04-14 DIAGNOSIS — E119 Type 2 diabetes mellitus without complications: Secondary | ICD-10-CM | POA: Diagnosis not present

## 2015-04-14 DIAGNOSIS — S8992XA Unspecified injury of left lower leg, initial encounter: Secondary | ICD-10-CM | POA: Insufficient documentation

## 2015-04-14 DIAGNOSIS — Z853 Personal history of malignant neoplasm of breast: Secondary | ICD-10-CM | POA: Diagnosis not present

## 2015-04-14 DIAGNOSIS — G43909 Migraine, unspecified, not intractable, without status migrainosus: Secondary | ICD-10-CM | POA: Insufficient documentation

## 2015-04-14 DIAGNOSIS — Y9389 Activity, other specified: Secondary | ICD-10-CM | POA: Diagnosis not present

## 2015-04-14 DIAGNOSIS — Y9289 Other specified places as the place of occurrence of the external cause: Secondary | ICD-10-CM | POA: Diagnosis not present

## 2015-04-14 DIAGNOSIS — M79605 Pain in left leg: Secondary | ICD-10-CM

## 2015-04-14 MED ORDER — ACETAMINOPHEN 325 MG PO TABS
650.0000 mg | ORAL_TABLET | Freq: Once | ORAL | Status: AC
  Administered 2015-04-14: 650 mg via ORAL
  Filled 2015-04-14: qty 2

## 2015-04-14 MED ORDER — TIZANIDINE HCL 4 MG PO TABS
4.0000 mg | ORAL_TABLET | Freq: Once | ORAL | Status: DC
  Filled 2015-04-14: qty 1

## 2015-04-14 MED ORDER — CYCLOBENZAPRINE HCL 10 MG PO TABS
5.0000 mg | ORAL_TABLET | Freq: Once | ORAL | Status: AC
  Administered 2015-04-14: 5 mg via ORAL
  Filled 2015-04-14: qty 1

## 2015-04-14 MED ORDER — TIZANIDINE HCL 4 MG PO TABS: 2.0000 mg | ORAL_TABLET | Freq: Four times a day (QID) | ORAL | Status: DC | PRN

## 2015-04-14 NOTE — Discharge Instructions (Signed)
Please read and follow all provided instructions.  Your diagnoses today include:  1. Pain of left lower extremity    Tests performed today include:  Vital signs. See below for your results today.   Medications prescribed:   Take medication as prescribed   Home care instructions:  Follow any educational materials contained in this packet.  Follow-up instructions: Please follow-up with your primary care provider in the next 48 hours for further evaluation of symptoms and treatment   Return instructions:   Please return to the Emergency Department if you do not get better, if you get worse, or new symptoms OR  - Fever (temperature greater than 101.44F)  - Bleeding that does not stop with holding pressure to the area    -Severe pain (please note that you may be more sore the day after your accident)  - Chest Pain  - Difficulty breathing  - Severe nausea or vomiting  - Inability to tolerate food and liquids  - Passing out  - Skin becoming red around your wounds  - Change in mental status (confusion or lethargy)  - New numbness or weakness     Please return if you have any other emergent concerns.  Additional Information:  Your vital signs today were: BP 123/71 mmHg   Pulse 64   Temp(Src) 98.4 F (36.9 C) (Oral)   Resp 20   Ht 5\' 4"  (1.626 m)   Wt 81.647 kg   BMI 30.88 kg/m2   SpO2 98% If your blood pressure (BP) was elevated above 135/85 this visit, please have this repeated by your doctor within one month. ---------------

## 2015-04-14 NOTE — ED Notes (Signed)
On discharge pt angry she didn't get the meds she asked for. Pt crying c/o of multiple things in which are different then why she came in to be seen. Pt is waiting on [facility] for transport.

## 2015-04-14 NOTE — ED Provider Notes (Signed)
CSN: [id number]     Arrival date & time 04/14/15  1719 History   First MD Initiated Contact with Patient 04/14/15 1941     Chief Complaint  Patient presents with  . Leg Pain   (Consider location/radiation/quality/duration/timing/severity/associated sxs/prior Treatment) HPI 60 y.o. female with a hx of DM, Anxiety, Lupus, HTN , presents to the Emergency Department today complaining of pain to left leg after fall. Notes that she was trying to get out of her wheelchair earlier today and the arm rest of the wheelchair collapsed and caused her to lose balance. Fell with no head trauma. No LOC. States wheel of wheelchair struck left leg and caused pain. Notes pain is 10/10 and generalized. Has not tried any OTC remedies. Notes chronic neck pain as well with spasms. Notes that she has been off of her regualr pain medication x 1 month due to discontinuation by PCP. Does not know why. Wishes for pain medication here in ED. No CP/SOB/ABD pain. No N/V/D. No congestion. No headache. No other symptoms noted.    Past Medical History  Diagnosis Date  . PERSONALITY DISORDER   . Chronic pain syndrome     chronic narcotics, dependancy hx - [facility] for pain mgmt  . MIGRAINE HEADACHE   . Lyme disease   . ALLERGIC RHINITIS   . ANEMIA-IRON DEFICIENCY   . HYPERLIPIDEMIA   . GERD   . DIABETES MELLITUS, TYPE II   . DEPRESSION   . SLE (systemic lupus erythematosus) (HCC)     [facility] - chronic pred  . Psoriasis   . Addison's disease ([place])   . Degenerative joint disease   . DDD (degenerative disc disease)   . Phlebitis after infusion     left ?calf; "S/P phenergan/demerol injection"  . Blood clot in vein 2011; 02/04/11    "right jugular vein;left jugular vein"  . SEIZURE DISORDER     "lots of seizures; due to lupus"  . Anxiety   . Neuropathy (HCC)     hands, feet, due to diabetes & back problem  . Surgical wound infection 02/15/2011    R shoulder and mastectomy site - related to PICC  .  CARCINOMA, BREAST 08/2009 dx    R breast,  mets to liver - resolution s/p chemo, s/p B mastect  . Metastases to the liver ([place])   . RA (rheumatoid arthritis) ([place])   . Hypothyroidism pt states she has never had hy pothyroidism  . Hypertension    Past Surgical History  Procedure Laterality Date  . Liver bopsy  09/2009  . Port a cath placement  09/2009; 11/19/10  . Port-a-cath removal  04/2010    "due to staph & strept"  . Port-a-cath removal  12/23/2010    Procedure: REMOVAL PORT-A-CATH;  Surgeon: Pedro Earls, MD;  Location: [facility];  Service: General;  Laterality: Left;  . Breast surgery  09/2009    right breast biopsy  . Mastectomy  11/19/10    bilateral mastectomy   . I&d extremity  ~ 2010    right hand; S/P "dog bite"  . Peripherally inserted central catheter insertion    . Spider bite    . Incision and drainage abscess Left 12/22/2013    Procedure: INCISION AND DRAINAGE ABSCESS OF LEFT THIGH;  Surgeon: Donnie Mesa, MD;  Location: [facility];  Service: General;  Laterality: Left;  . I&d extremity Left 05/09/2014    Procedure: IRRIGATION AND DEBRIDEMENT LEFT THIGH ABSCESS;  Surgeon: Meredith Pel, MD;  Location: [facility];  Service: Orthopedics;  Laterality: Left;  . I&d extremity Left 05/23/2014    Procedure: IRRIGATION AND DEBRIDEMENT EXTREMITY;  Surgeon: Meredith Pel, MD;  Location: [place];  Service: Orthopedics;  Laterality: Left;  . Application of wound vac Left 05/23/2014    Procedure: APPLICATION OF WOUND VAC;  Surgeon: Meredith Pel, MD;  Location: [facility];  Service: Orthopedics;  Laterality: Left;   Family History  Problem Relation Age of Onset  . Lung cancer Mother   . Lung cancer Father   . Arthritis Other   . Diabetes Other   . Hyperlipidemia Other    Social History  Substance Use Topics  . Smoking status: Never Smoker   . Smokeless tobacco: Never Used     Comment: Never used tobacco  . Alcohol Use: No   OB History    No data available     Review of  Systems ROS reviewed and all are negative for acute change except as noted in the HPI.  Allergies  Ammonia; Contrast media; Iohexol; Methotrexate; Midazolam hcl; Nalbuphine; Bee venom; Hydroxychloroquine; Infliximab; Ketorolac tromethamine; Morphine and related; Nsaids; Penicillins; Pentazocine; Rofecoxib; Tetracyclines & related; and Celecoxib  Home Medications   Prior to Admission medications   Medication Sig Start Date End Date Taking? Authorizing Provider  baclofen (LIORESAL) 10 MG tablet Take 10 mg by mouth 3 (three) times daily.   Yes Historical Provider, MD  cephALEXin (KEFLEX) 500 MG capsule Take 500 mg by mouth 2 (two) times daily. 04/10/15 04/17/15 Yes Historical Provider, MD  cholecalciferol (VITAMIN D) 1000 units tablet Take 2,000 Units by mouth daily.   Yes Historical Provider, MD  cholestyramine (QUESTRAN) 4 g packet Take 4 g by mouth 3 (three) times daily with meals.   Yes Historical Provider, MD  clonazePAM (KLONOPIN) 1 MG tablet Take one tablet by mouth twice daily Patient taking differently: 1 mg 2 (two) times daily. Take one tablet by mouth twice daily 08/28/13  Yes Estill Dooms, MD  diphenhydrAMINE (BENADRYL) 25 mg capsule Take 25 mg by mouth every 6 (six) hours as needed.   Yes Historical Provider, MD  DOXYCYCLINE HYCLATE PO Take 100 mg by mouth daily.   Yes Historical Provider, MD  DULoxetine (CYMBALTA) 60 MG capsule Take 1 capsule (60 mg total) by mouth 2 (two) times daily. 06/11/12  Yes Rowe Clack, MD  EPINEPHrine 0.3 mg/0.3 mL IJ SOAJ injection Inject 0.3 mg into the muscle once.   Yes Historical Provider, MD  furosemide (LASIX) 20 MG tablet Take 40 mg by mouth every morning.    Yes Historical Provider, MD  gabapentin (NEURONTIN) 800 MG tablet Take 800 mg by mouth 3 (three) times daily.   Yes Historical Provider, MD  insulin lispro (HUMALOG) 100 UNIT/ML injection Inject into the skin 3 (three) times daily before meals.   Yes Historical Provider, MD  Lactobacillus  (ACIDOPHILUS) 100 MG CAPS Take by mouth.   Yes Historical Provider, MD  methadone (DOLOPHINE) 5 MG tablet Take 10 mg by mouth every 12 (twelve) hours.   Yes Historical Provider, MD  metoprolol tartrate (LOPRESSOR) 25 MG tablet Take 25 mg by mouth 2 (two) times daily.   Yes Historical Provider, MD  Multiple Vitamins-Minerals (MULTIVITAMIN WITH MINERALS) tablet Take 1 tablet by mouth daily.   Yes Historical Provider, MD  polyethylene glycol (MIRALAX / GLYCOLAX) packet Take 17 g by mouth daily.   Yes Historical Provider, MD  polyvinyl alcohol (LIQUIFILM TEARS) 1.4 % ophthalmic solution 1  drop as needed for dry eyes.   Yes Historical Provider, MD  potassium chloride SA (K-DUR,KLOR-CON) 20 MEQ tablet 20 MEQ daily Patient taking differently: Take 20 mEq by mouth 2 (two) times daily. 20 MEQ daily 05/14/14  Yes Modena Jansky, MD  predniSONE (DELTASONE) 5 MG tablet Take 10 mg by mouth daily with breakfast.    Yes Historical Provider, MD  promethazine (PHENERGAN) 12.5 MG tablet Take 12.5 mg by mouth every 6 (six) hours as needed for nausea or vomiting.   Yes Historical Provider, MD  rOPINIRole (REQUIP) 1 MG tablet Take 2 mg by mouth at bedtime. TAKES A 2 MG TAB   Yes Historical Provider, MD  senna-docusate (SENOKOT-S) 8.6-50 MG per tablet Take 1 tablet by mouth 2 (two) times daily as needed for mild constipation.    Yes Historical Provider, MD  spironolactone (ALDACTONE) 25 MG tablet Take 25 mg by mouth daily.   Yes Historical Provider, MD  traZODone (DESYREL) 150 MG tablet Take 150 mg by mouth at bedtime.    Yes Historical Provider, MD  acetaminophen (TYLENOL) 500 MG tablet Take 1 tablet (500 mg total) by mouth every 6 (six) hours as needed for mild pain or fever. Total daily tylenol dose from all medications should not exceed 4 gm. Please follow up with your pain MD. 05/14/14   Modena Jansky, MD  meclizine (ANTIVERT) 12.5 MG tablet TK 1 T PO TID PRN 07/15/14   Historical Provider, MD  pantoprazole (PROTONIX)  20 MG tablet Take 40 mg by mouth daily.     Historical Provider, MD  tiZANidine (ZANAFLEX) 4 MG tablet Take 2-4 mg by mouth every 6 (six) hours as needed for muscle spasms.     Historical Provider, MD   BP 119/73 mmHg  Pulse 68  Temp(Src) 98.7 F (37.1 C) (Oral)  Resp 16  Ht 5\' 4"  (1.626 m)  Wt 81.647 kg  BMI 30.88 kg/m2   Physical Exam  Constitutional: She is oriented to person, place, and time. She appears well-developed and well-nourished.  HENT:  Head: Normocephalic and atraumatic.  Eyes: EOM are normal. Pupils are equal, round, and reactive to light.  Neck: Normal range of motion. Neck supple. No tracheal deviation present.  Cardiovascular: Normal rate, regular rhythm and normal heart sounds.   No murmur heard. Pulmonary/Chest: Effort normal. No respiratory distress. She has no wheezes. She has no rales. She exhibits no tenderness.  Abdominal: Soft.  Musculoskeletal: Normal range of motion.       Left hip: She exhibits normal range of motion, normal strength, no tenderness, no bony tenderness, no swelling, no deformity and no laceration.       Left knee: Normal.  Surgical scar noted. No signs of infection. No erythema. No swelling. No red streaking. No Purulent drainage. No ecchymosis   Neurological: She is alert and oriented to person, place, and time.  Skin: Skin is warm and dry.  Psychiatric: She has a normal mood and affect. Her behavior is normal. Thought content normal.  Nursing note and vitals reviewed.   ED Course  Procedures (including critical care time) Labs Review Labs Reviewed - No data to display  Imaging Review No results found. I have personally reviewed and evaluated these images and lab results as part of my medical decision-making.   EKG Interpretation None      MDM  I have reviewed and evaluated the relevant laboratory values.I have reviewed and evaluated the relevant imaging studies.I personally evaluated and interpreted the relevant  EKG.I have  reviewed the relevant previous healthcare records.I have reviewed EMS Documentation.I obtained HPI from historian. Patient discussed with supervising physician  ED Course:  Assessment: 26y F presents with fall sustained today. No head trauma. No LOC. Not pain to left leg where wheelchair struck leg. No visible ecchymosis. Previous surgical scar noted without bleeding/erythema/purulent drainage. No signs of infection. On exam, pt in NAD. VSS. Afebrile. Lungs CTA. Heart RRR. Full ROM of L hip without pain. Lateral hip TTP. No swelling noted. Low concern for fracture. Pt normally ambulates with walker. Upon questioning, pt main reason for being here is for pain medication after being DCed from it x1 month. Offered patient alternatives to pain medication and muscle relaxants. Discussed harm of continued narcotic use. Patient understood and agreed with plan. Will have patient follow up with PCP for further pain management. At time of discharge, Patient is in no acute distress. Vital Signs are stable. Patient is able to ambulate. Patient able to tolerate PO.    Disposition/Plan:  DC Home Additional Verbal discharge instructions given and discussed with patient.  Pt Instructed to f/u with PCP in the next 48 hours for evaluation and treatment of symptoms. Return precautions given Pt acknowledges and agrees with plan  Supervising Physician Sharlett Iles, MD   Final diagnoses:  Pain of left lower extremity        Shary Decamp, PA-C 04/14/15 2151  Sharlett Iles, MD 04/17/15 [phone]

## 2015-04-14 NOTE — ED Notes (Signed)
Per EMS:  Pt having leg pain, neck pain.  Pt is a resident at the [facility].  Pt is requesting a picc line and pain medication.

## 2015-04-14 NOTE — ED Notes (Addendum)
Pt states she has been off her pain medications for a month without her knowledge for Lupus.  She is having pain in her neck.  Pt states she fell while getting out of the wheelchair and she went to the floor.  Pt states she jarred her neck and her right hip.  Pt would also like to have her left leg checked from an old wound as well as bilateral leg redness.

## 2015-04-14 NOTE — ED Notes (Signed)
Pt states she was trying to get up out of her w/c to go to the BR and arm of w/c was not secured and she fell. Then, the w/c fell on her. Now has multiple complaints. MAEX4. Denies LOC. PERRL. Hx of active MRSA around spine. LE are warm to touch and slightly reddened. Has surgical site on left hip that has abrasion noted as well. Positive pulses palp.

## 2015-07-27 ENCOUNTER — Encounter: Payer: Self-pay | Admitting: *Deleted

## 2015-07-27 ENCOUNTER — Telehealth: Payer: Self-pay | Admitting: *Deleted

## 2015-07-27 NOTE — Telephone Encounter (Signed)
Dr Marin Olp received results from an outside imaging center of a CT scan. He wants patient to come in to be seen based on results. Attempts have been made, 2 times each day, on 07/23/15, 07/24/15 and 07/27/15. Messages have been left requesting patient to call back, without response. Letter being sent out today requesting that patient call office for follow up appointment.

## 2015-07-28 ENCOUNTER — Encounter: Payer: Self-pay | Admitting: Hematology & Oncology

## 2015-08-28 ENCOUNTER — Ambulatory Visit: Payer: Medicare Other | Admitting: Hematology & Oncology

## 2015-08-28 ENCOUNTER — Other Ambulatory Visit: Payer: Medicare Other

## 2015-09-12 LAB — CBC AND DIFFERENTIAL
HEMATOCRIT: 36 % (ref 36–46)
HEMOGLOBIN: 11 g/dL — AB (ref 12.0–16.0)
PLATELETS: 417 10*3/uL — AB (ref 150–399)
WBC: 13.9 10^3/mL

## 2015-09-12 LAB — HEPATIC FUNCTION PANEL
ALT: 17 U/L (ref 7–35)
AST: 27 U/L (ref 13–35)
Alkaline Phosphatase: 153 U/L — AB (ref 25–125)
BILIRUBIN, TOTAL: 0.6 mg/dL

## 2015-09-12 LAB — BASIC METABOLIC PANEL
BUN: 13 mg/dL (ref 4–21)
Creatinine: 0.9 mg/dL (ref 0.5–1.1)
GLUCOSE: 113 mg/dL
Potassium: 3.6 mmol/L (ref 3.4–5.3)
Sodium: 135 mmol/L — AB (ref 137–147)

## 2015-09-16 LAB — BASIC METABOLIC PANEL
BUN: 18 mg/dL (ref 4–21)
CREATININE: 1 mg/dL (ref 0.5–1.1)
Potassium: 3.9 mmol/L (ref 3.4–5.3)
Sodium: 138 mmol/L (ref 137–147)

## 2015-09-16 LAB — CBC AND DIFFERENTIAL
HEMATOCRIT: 32 % — AB (ref 36–46)
Hemoglobin: 10.1 g/dL — AB (ref 12.0–16.0)
PLATELETS: 276 10*3/uL (ref 150–399)
WBC: 6.7 10*3/mL

## 2015-09-21 ENCOUNTER — Non-Acute Institutional Stay (SKILLED_NURSING_FACILITY): Payer: Medicare Other | Admitting: Internal Medicine

## 2015-09-21 ENCOUNTER — Encounter: Payer: Self-pay | Admitting: Internal Medicine

## 2015-09-21 DIAGNOSIS — F1110 Opioid abuse, uncomplicated: Secondary | ICD-10-CM | POA: Insufficient documentation

## 2015-09-21 DIAGNOSIS — R9089 Other abnormal findings on diagnostic imaging of central nervous system: Secondary | ICD-10-CM | POA: Insufficient documentation

## 2015-09-21 DIAGNOSIS — M329 Systemic lupus erythematosus, unspecified: Secondary | ICD-10-CM

## 2015-09-21 DIAGNOSIS — Z22322 Carrier or suspected carrier of Methicillin resistant Staphylococcus aureus: Secondary | ICD-10-CM | POA: Insufficient documentation

## 2015-09-21 DIAGNOSIS — Z9181 History of falling: Secondary | ICD-10-CM | POA: Insufficient documentation

## 2015-09-21 DIAGNOSIS — G629 Polyneuropathy, unspecified: Secondary | ICD-10-CM | POA: Insufficient documentation

## 2015-09-21 DIAGNOSIS — R7881 Bacteremia: Secondary | ICD-10-CM | POA: Insufficient documentation

## 2015-09-21 DIAGNOSIS — F339 Major depressive disorder, recurrent, unspecified: Secondary | ICD-10-CM

## 2015-09-21 DIAGNOSIS — L930 Discoid lupus erythematosus: Secondary | ICD-10-CM | POA: Insufficient documentation

## 2015-09-21 DIAGNOSIS — K219 Gastro-esophageal reflux disease without esophagitis: Secondary | ICD-10-CM

## 2015-09-21 DIAGNOSIS — G6289 Other specified polyneuropathies: Secondary | ICD-10-CM

## 2015-09-21 DIAGNOSIS — R519 Headache, unspecified: Secondary | ICD-10-CM | POA: Insufficient documentation

## 2015-09-21 DIAGNOSIS — E079 Disorder of thyroid, unspecified: Secondary | ICD-10-CM | POA: Insufficient documentation

## 2015-09-21 DIAGNOSIS — G43709 Chronic migraine without aura, not intractable, without status migrainosus: Secondary | ICD-10-CM | POA: Insufficient documentation

## 2015-09-21 DIAGNOSIS — M5416 Radiculopathy, lumbar region: Secondary | ICD-10-CM | POA: Insufficient documentation

## 2015-09-21 DIAGNOSIS — E876 Hypokalemia: Secondary | ICD-10-CM

## 2015-09-21 DIAGNOSIS — G894 Chronic pain syndrome: Secondary | ICD-10-CM

## 2015-09-21 DIAGNOSIS — M797 Fibromyalgia: Secondary | ICD-10-CM | POA: Insufficient documentation

## 2015-09-21 DIAGNOSIS — R51 Headache: Secondary | ICD-10-CM

## 2015-09-21 DIAGNOSIS — A419 Sepsis, unspecified organism: Secondary | ICD-10-CM | POA: Diagnosis not present

## 2015-09-21 DIAGNOSIS — F1120 Opioid dependence, uncomplicated: Secondary | ICD-10-CM | POA: Insufficient documentation

## 2015-09-21 DIAGNOSIS — E871 Hypo-osmolality and hyponatremia: Secondary | ICD-10-CM

## 2015-09-21 DIAGNOSIS — L03116 Cellulitis of left lower limb: Secondary | ICD-10-CM | POA: Diagnosis not present

## 2015-09-21 DIAGNOSIS — D72829 Elevated white blood cell count, unspecified: Secondary | ICD-10-CM | POA: Insufficient documentation

## 2015-09-21 DIAGNOSIS — Z5181 Encounter for therapeutic drug level monitoring: Secondary | ICD-10-CM | POA: Insufficient documentation

## 2015-09-21 DIAGNOSIS — M5136 Other intervertebral disc degeneration, lumbar region: Secondary | ICD-10-CM | POA: Insufficient documentation

## 2015-09-21 DIAGNOSIS — G253 Myoclonus: Secondary | ICD-10-CM | POA: Insufficient documentation

## 2015-09-21 NOTE — Progress Notes (Signed)
MRN: [id number] Name: [name] Carr  Sex: female Age: 60 y.o. DOB: Jan 14, 1956  [place] #:  Facility/Room: [facility] / [facility] [address] Level Of Care: SNF Provider: Noah Delaine. Sheppard Coil, MD Emergency Contacts: Extended Emergency Contact Information Primary Emergency Contact: [place], [place] [address] [place] Phone: [phone] Relation: Friend Secondary Emergency Contact: [place], [place] [address] [place] Phone: [phone] Relation: Friend  Code Status: Full Code  Allergies: Ammonia; Contrast media [iodinated diagnostic agents]; Iohexol; Methotrexate; Midazolam hcl; Nalbuphine; Bee venom; Hydroxychloroquine; Infliximab; Ketorolac tromethamine; Morphine and related; Nsaids; Penicillins; Pentazocine; Rofecoxib; Tetracyclines & related; and Celecoxib  Chief Complaint  Patient presents with  . New Admit To SNF    Admit to Facility    HPI: Patient is 60 y.o. female with fibromyalgia, chronic pain, recurrent LE cellulitis who was admitted to [facility] from 8/5-11 for sepsis 2/2 BLE cellulitis, B knee wounds and L lateral thigh and L forearm wounds. Hospital course was complicated by hyponatremia and nhyperglycemia and chronic pain. Pt is admitted to SNF for generalized weakness. While at SNF pt will be followed for depression, tx with cymbalta, neuropathy, tx with neurontin and GERD, tx with protonix.  Past Medical History:  Diagnosis Date  . Abnormal brain MRI   . Addison's disease ([place])   . ALLERGIC RHINITIS   . ANEMIA-IRON DEFICIENCY   . Anxiety   . At risk for falls   . Bacteremia   . Blood clot in vein 2011; 02/04/11   "right jugular vein;left jugular vein"  . CARCINOMA, BREAST 08/2009 dx   R breast,  mets to liver - resolution s/p chemo, s/p B mastect  . Cephalgia   . Chronic constipation   . Chronic migraine w/o aura w/o status migrainosus, not intractable   . Chronic pain syndrome    chronic narcotics,  dependancy hx - [facility] for pain mgmt  . DDD (degenerative disc disease)   . Degenerative joint disease   . DEPRESSION   . DIABETES MELLITUS, TYPE II   . Disease of thyroid gland   . Fibromyalgia   . GERD   . HYPERLIPIDEMIA   . Hypertension   . Hypothyroidism pt states she has never had hy pothyroidism  . Leukocytosis   . Lumbar degenerative disc disease   . Lumbar radiculopathy   . Lupus erythematosus   . Lyme disease   . Metastases to the liver ([place])   . MIGRAINE HEADACHE   . MRSA (methicillin resistant staph aureus) culture positive   . Munchausen syndrome   . Myoclonus   . Narcotic abuse   . Neuropathy (HCC)    hands, feet, due to diabetes & back problem  . Opioid dependence ([place])   . Peripheral neuropathy ([place])   . PERSONALITY DISORDER   . Phlebitis after infusion    left ?calf; "S/P phenergan/demerol injection"  . Psoriasis   . RA (rheumatoid arthritis) ([place])   . SEIZURE DISORDER    "lots of seizures; due to lupus"  . SLE (systemic lupus erythematosus) (HCC)    rheum at [facility] ([place]) - chronic pred  . Sleeping difficulties   . Small vessel disease ([place])   . Speech disturbance   . Surgical wound infection 02/15/2011   R shoulder and mastectomy site - related to PICC  . Therapeutic drug monitoring     Past Surgical History:  Procedure Laterality  Date  . APPLICATION OF WOUND VAC Left 05/23/2014   Procedure: APPLICATION OF WOUND VAC;  Surgeon: Meredith Pel, MD;  Location: [facility];  Service: Orthopedics;  Laterality: Left;  . BREAST SURGERY  09/2009   right breast biopsy  . I&D EXTREMITY  ~ 2010   right hand; S/P "dog bite"  . I&D EXTREMITY Left 05/09/2014   Procedure: IRRIGATION AND DEBRIDEMENT LEFT THIGH ABSCESS;  Surgeon: Meredith Pel, MD;  Location: [facility];  Service: Orthopedics;  Laterality: Left;  . I&D EXTREMITY Left 05/23/2014   Procedure: IRRIGATION AND DEBRIDEMENT EXTREMITY;  Surgeon: Meredith Pel, MD;  Location: [facility];  Service: Orthopedics;   Laterality: Left;  . INCISION AND DRAINAGE ABSCESS Left 12/22/2013   Procedure: INCISION AND DRAINAGE ABSCESS OF LEFT THIGH;  Surgeon: Donnie Mesa, MD;  Location: [facility];  Service: General;  Laterality: Left;  . liver bopsy  09/2009  . MASTECTOMY  11/19/10   bilateral mastectomy   . PERIPHERALLY INSERTED CENTRAL CATHETER INSERTION    . port a cath placement  09/2009; 11/19/10  . PORT-A-CATH REMOVAL  04/2010   "due to staph & strept"  . PORT-A-CATH REMOVAL  12/23/2010   Procedure: REMOVAL PORT-A-CATH;  Surgeon: Pedro Earls, MD;  Location: [facility];  Service: General;  Laterality: Left;  . spider bite        Medication List       Accurate as of 09/21/15  9:54 AM. Always use your most recent med list.          Acidophilus 100 MG Caps Take 1 packet by mouth 2 times daily   acyclovir 400 MG tablet Commonly known as:  ZOVIRAX Take 400 mg by mouth 3 (three) times daily. 6 am , noon and 6 pm   clindamycin 300 MG capsule Commonly known as:  CLEOCIN Take 300 mg by mouth every 8 (eight) hours.   clonazePAM 1 MG tablet Commonly known as:  KLONOPIN Take 1 mg by mouth 2 (two) times daily as needed for anxiety. For up to 7 days   DOXYCYCLINE HYCLATE PO Take 100 mg by mouth daily. Start taking after completion of 5 days of clindamycin.   DULoxetine 60 MG capsule Commonly known as:  CYMBALTA Take 1 capsule (60 mg total) by mouth 2 (two) times daily.   EPIPEN 2-PAK 0.3 mg/0.3 mL Soaj injection Generic drug:  EPINEPHrine Inject 0.3 mg into the muscle once. Inject utd if bitten by insect   EQL VITAMIN D3 2000 units Caps Generic drug:  Cholecalciferol Take 1 capsule by mouth daily.   furosemide 20 MG tablet Commonly known as:  LASIX Take 20 mg by mouth daily.   gabapentin 800 MG tablet Commonly known as:  NEURONTIN Take 800 mg by mouth 3 (three) times daily.   HYDROcodone-acetaminophen 10-325 MG tablet Commonly known as:  NORCO Take 1 tablet by mouth every 8 (eight) hours  as needed. For pain, for up to 7 days   LINZESS 290 MCG Caps capsule Generic drug:  linaclotide Take 290 mcg by mouth daily before breakfast.   methadone 5 MG tablet Commonly known as:  DOLOPHINE Take 10 mg by mouth 2 (two) times daily. For 5 days   metoprolol tartrate 25 MG tablet Commonly known as:  LOPRESSOR Take 25 mg by mouth 2 (two) times daily.   multivitamin with minerals tablet Take 1 tablet by mouth daily.   ondansetron 8 MG tablet Commonly known as:  ZOFRAN Take 8 mg by mouth  3 (three) times daily as needed for nausea or vomiting.   pantoprazole 40 MG tablet Commonly known as:  PROTONIX Take 40 mg by mouth 2 (two) times daily.   polyethylene glycol packet Commonly known as:  MIRALAX / GLYCOLAX Take 17 g by mouth daily.   polyvinyl alcohol 1.4 % ophthalmic solution Commonly known as:  LIQUIFILM TEARS Place 1 drop into both eyes every 2 (two) hours as needed for dry eyes.   potassium chloride 20 MEQ packet Commonly known as:  KLOR-CON Take 20 mEq by mouth daily.   predniSONE 5 MG tablet Commonly known as:  DELTASONE Take 10 mg by mouth daily with breakfast.   promethazine 25 MG tablet Commonly known as:  PHENERGAN Take 25 mg by mouth every 6 (six) hours as needed for nausea or vomiting.   Protein Powd Take 2 scoop by mouth 2 (two) times daily.   rOPINIRole 2 MG tablet Commonly known as:  REQUIP Take 2 mg by mouth at bedtime.   senna-docusate 8.6-50 MG tablet Commonly known as:  Senokot-S Take 2 tablets by mouth 2 (two) times daily.   spironolactone 25 MG tablet Commonly known as:  ALDACTONE Take 50 mg ( 2 tablets ) by mouth daily   tiZANidine 4 MG tablet Commonly known as:  ZANAFLEX Take 4 mg by mouth 3 (three) times daily. Take at 6 am , noon, and 6 pm   traZODone 50 MG tablet Commonly known as:  DESYREL Take 50 mg by mouth at bedtime.       Meds ordered this encounter  Medications  . clindamycin (CLEOCIN) 300 MG capsule    Sig: Take  300 mg by mouth every 8 (eight) hours.  Marland Kitchen HYDROcodone-acetaminophen (NORCO) 10-325 MG tablet    Sig: Take 1 tablet by mouth every 8 (eight) hours as needed. For pain, for up to 7 days  . acyclovir (ZOVIRAX) 400 MG tablet    Sig: Take 400 mg by mouth 3 (three) times daily. 6 am , noon and 6 pm  . clonazePAM (KLONOPIN) 1 MG tablet    Sig: Take 1 mg by mouth 2 (two) times daily as needed for anxiety. For up to 7 days  . EPINEPHrine (EPIPEN 2-PAK) 0.3 mg/0.3 mL IJ SOAJ injection    Sig: Inject 0.3 mg into the muscle once. Inject utd if bitten by insect  . furosemide (LASIX) 20 MG tablet    Sig: Take 20 mg by mouth daily.  Marland Kitchen linaclotide (LINZESS) 290 MCG CAPS capsule    Sig: Take 290 mcg by mouth daily before breakfast.  . ondansetron (ZOFRAN) 8 MG tablet    Sig: Take 8 mg by mouth 3 (three) times daily as needed for nausea or vomiting.  . pantoprazole (PROTONIX) 40 MG tablet    Sig: Take 40 mg by mouth 2 (two) times daily.  . potassium chloride (KLOR-CON) 20 MEQ packet    Sig: Take 20 mEq by mouth daily.  . promethazine (PHENERGAN) 25 MG tablet    Sig: Take 25 mg by mouth every 6 (six) hours as needed for nausea or vomiting.  . Cholecalciferol (EQL VITAMIN D3) 2000 units CAPS    Sig: Take 1 capsule by mouth daily.  . Protein POWD    Sig: Take 2 scoop by mouth 2 (two) times daily.  Marland Kitchen rOPINIRole (REQUIP) 2 MG tablet    Sig: Take 2 mg by mouth at bedtime.  Marland Kitchen tiZANidine (ZANAFLEX) 4 MG tablet    Sig: Take 4 mg  by mouth 3 (three) times daily. Take at 6 am , noon, and 6 pm  . traZODone (DESYREL) 50 MG tablet    Sig: Take 50 mg by mouth at bedtime.    Immunization History  Administered Date(s) Administered  . Influenza Split 01/08/2012  . Influenza,inj,Quad PF,36+ Mos 01/01/2013  . Pneumococcal Polysaccharide-23 02/08/2008  . Td 02/07/2006  . Tdap 12/12/2014    Social History  Substance Use Topics  . Smoking status: Never Smoker  . Smokeless tobacco: Never Used     Comment: Never  used tobacco  . Alcohol use No    Family history is   Family History  Problem Relation Age of Onset  . Lung cancer Mother   . Diabetes Mother   . Lung cancer Father   . Diabetes Father   . Arthritis Other   . Diabetes Other   . Hyperlipidemia Other   . Diabetes Brother       Review of Systems  DATA OBTAINED: from patient, nurse GENERAL:  no fevers, fatigue, appetite changes; c/o not on correct pain medications SKIN: No itching, rash; + wounds EYES: No eye pain, redness, discharge EARS: No earache, tinnitus, change in hearing NOSE: No congestion, drainage or bleeding  MOUTH/THROAT: No mouth or tooth pain, No sore throat RESPIRATORY: No cough, wheezing, SOB CARDIAC: No chest pain, palpitations, lower extremity edema  GI: No abdominal pain, No N/V/D or constipation, No heartburn or reflux  GU: No dysuria, frequency or urgency, or incontinence  MUSCULOSKELETAL: No unrelieved bone/joint pain NEUROLOGIC: No headache, dizziness or focal weakness PSYCHIATRIC: No c/o anxiety or sadness   Vitals:   09/21/15 0857  BP: 129/79  Pulse: 79  Resp: 18  Temp: 99.1 F (37.3 C)    SpO2 Readings from Last 1 Encounters:  04/14/15 98%        Physical Exam  GENERAL APPEARANCE: Alert, conversant,  No acute distress.  SKIN:multiple wounds - B LLE , LUE dressed and L knee with swelling and redness, which pt says drains but I don't see evidence HEAD: Normocephalic, atraumatic  EYES: Conjunctiva/lids clear. Pupils round, reactive. EOMs intact.  EARS: External exam WNL, canals clear. Hearing grossly normal.  NOSE: No deformity or discharge.  MOUTH/THROAT: Lips w/o lesions  RESPIRATORY: Breathing is even, unlabored. Lung sounds are clear   CARDIOVASCULAR: Heart RRR no murmurs, rubs or gallops. No peripheral edema.   GASTROINTESTINAL: Abdomen is soft, non-tender, not distended w/ normal bowel sounds. GENITOURINARY: Bladder non tender, not distended  MUSCULOSKELETAL: L knee as above;  abrasion R knee, healing NEUROLOGIC:  Cranial nerves 2-12 grossly intact. Moves all extremities  PSYCHIATRIC: odd compared to prior, slower thought processes, no behavioral issues  Patient Active Problem List   Diagnosis Date Noted  . Abnormal brain MRI   . At risk for falls   . Bacteremia   . Cephalgia   . Chronic migraine w/o aura w/o status migrainosus, not intractable   . Disease of thyroid gland   . Fibromyalgia   . Leukocytosis   . Lumbar degenerative disc disease   . Lumbar radiculopathy   . Lupus erythematosus   . MRSA (methicillin resistant staph aureus) culture positive   . Myoclonus   . Narcotic abuse   . Opioid dependence ([place])   . Peripheral neuropathy ([place])   . Therapeutic drug monitoring   . Fever   . Somnolence 05/25/2014  . Hypotension 05/25/2014  . FUO (fever of unknown origin)   . Anemia 05/24/2014  . Thigh abscess  05/23/2014  . Abscess   . MRSA infection   . Munchausen syndrome   . Malingering   . Mixed personality disorder   . 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 Drug-seeking behavior   . Infection 05/09/2014  . Abscess of left thigh 05/08/2014  . Diabetes mellitus type 2, controlled ([place]) 05/08/2014  . 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 Neuropathy ([place]) 08/28/2013  . Hypertension   . Cellulitis and abscess of leg 08/22/2013  . Syncope 01/11/2012  . SLE-on prednisone-Follow at [facility] 02/05/2011  . DVT L IJ 12/26/10 02/04/2011  . Normocytic anemia 12/27/2010  . Malignant neoplasm of female breast ([place]) 09/07/2009  . Type 2 diabetes mellitus ([place]) 09/07/2009  . HYPERLIPIDEMIA 09/07/2009  . Iron deficiency anemia 09/07/2009  . Chronic pain syndrome 09/07/2009  . MIGRAINE HEADACHE 09/07/2009  . ALLERGIC RHINITIS 09/07/2009  . GERD 09/07/2009  . SEIZURE DISORDER 09/07/2009  . PERSONALITY DISORDER 09/01/2009  . Major depression, recurrent, chronic ([place]) 09/01/2009  . RENAL INSUFFICIENCY 09/01/2009        Component Value Date/Time   WBC 6.7 09/16/2015   WBC 4.3 12/26/2014 0649   RBC 3.71 (L) 12/26/2014 0649   HGB 10.1 (A) 09/16/2015   HGB 12.3 08/08/2014 1324   HGB 8.4 (L) 04/08/2013 1148   HCT 32 (A) 09/16/2015   HCT 38.3 08/08/2014 1324   HCT 27.7 (L) 04/08/2013 1148   PLT 276 09/16/2015   PLT 305 08/08/2014 1324   PLT 289 04/08/2013 1148   MCV 89.5 12/26/2014 0649   MCV 90 08/08/2014 1324   MCV 72.4 (L) 04/08/2013 1148   LYMPHSABS 1.6 12/15/2014 0726   LYMPHSABS 0.8 (L) 08/08/2014 1324   LYMPHSABS 0.6 (L) 04/08/2013 1148   MONOABS 0.6 12/15/2014 0726   MONOABS 0.6 04/08/2013 1148   EOSABS 0.1 12/15/2014 0726   EOSABS 0.1 08/08/2014 1324   BASOSABS 0.0 12/15/2014 0726   BASOSABS 0.0 08/08/2014 1324   BASOSABS 0.1 04/08/2013 1148        Component Value Date/Time   NA 138 09/16/2015   NA 135 04/25/2013 1439   NA 135 (L) 04/08/2013 1148   K 3.9 09/16/2015   K 3.5 04/25/2013 1439   K 4.6 04/08/2013 1148   CL 104 12/26/2014 0649   CL 90 (L) 04/25/2013 1439   CL 91 (L) 03/15/2012 1410   CO2 30 12/26/2014 0649   CO2 32 04/25/2013 1439   CO2 28 04/08/2013 1148   GLUCOSE 95 12/26/2014 0649   GLUCOSE 201 (H) 04/25/2013 1439   GLUCOSE 131 (H) 03/15/2012 1410   BUN 18 09/16/2015   BUN 26 (H) 04/25/2013 1439   BUN 29.9 (H) 04/08/2013 1148   CREATININE 1.0 09/16/2015   CREATININE 0.83 12/26/2014 0649   CREATININE 0.94 09/11/2013 1718   CREATININE 1.2 (H) 04/08/2013 1148   CALCIUM 9.4 12/26/2014 0649   CALCIUM 9.9 04/25/2013 1439   CALCIUM 9.6 04/08/2013 1148   PROT 6.3 (L) 12/15/2014 0726   PROT 8.5 (H) 04/25/2013 1439   PROT 6.9 04/08/2013 1148   ALBUMIN 3.2 (L) 12/15/2014 0726   ALBUMIN 4.1 04/25/2013 1439   ALBUMIN 3.4 (L) 04/08/2013 1148   AST 27 09/12/2015   AST 21 04/25/2013 1439   AST 17 04/08/2013 1148   ALT 17 09/12/2015   ALT 36 04/25/2013 1439   ALT 18 04/08/2013 1148   ALKPHOS 153 (A) 09/12/2015   ALKPHOS 102 (H) 04/25/2013 1439   ALKPHOS  100 04/08/2013  1148   BILITOT 0.3 12/15/2014 0726   BILITOT 0.70 04/25/2013 1439   BILITOT 0.34 04/08/2013 1148   GFRNONAA >60 12/26/2014 0649   GFRNONAA 67 09/11/2013 1718   GFRAA >60 12/26/2014 0649   GFRAA 77 09/11/2013 1718    Lab Results  Component Value Date   HGBA1C 6.5 (H) 05/23/2014    Lab Results  Component Value Date   CHOL 220 (H) 09/12/2011   HDL 61.60 09/12/2011   LDLCALC 162 08/09/2009   LDLDIRECT 127.6 09/12/2011   TRIG 121.0 09/12/2011   CHOLHDL 4 09/12/2011     No results found.  Not all labs, radiology exams or other studies done during hospitalization come through on my EPIC note; however they are reviewed by me.    Assessment and Plan  SEPSIS/BLE WOUNDS- tx with teflaro until 8/10, then switched to clindamycin until 8/15;  Then doxycycline 100 mg TID SNF - pt says she is vomiting the clindamycin, she said she did today when she was given it with pudding and large quantities of water; I have ordered 300 mf doses IM for the lat 4 doses then will switch to doxycycline 100 mg TID until 10/14/15  HYPONATREMIA/HYPOKALEMIA- REPLETED SNF- will f/u BMP  CHRONIC PAIN SYNDROME/FIBROMYALGIA/CHRONIC NECK PAIN SNF-  continuing methadone and scheduling hydrocone because she is asking for it EVEY time  DEPRESSION SNF- cont cymbalta 60 mg BID  POLYNEUROPATHY SNF- CONTINUE neurontin 800 mg TID  GERD SNF - cont protonix 40 mg BID  SLE SNF - cont prednisone 10 mg daily    Time spent > 45 min;> 50% of time with patient was spent reviewing records, labs, tests and studies, counseling and developing plan of care  Webb Silversmith D. Sheppard Coil, MD

## 2015-09-22 ENCOUNTER — Other Ambulatory Visit: Payer: Self-pay | Admitting: Nurse Practitioner

## 2015-09-22 DIAGNOSIS — C50911 Malignant neoplasm of unspecified site of right female breast: Secondary | ICD-10-CM

## 2015-09-23 ENCOUNTER — Other Ambulatory Visit: Payer: Medicare Other

## 2015-09-23 ENCOUNTER — Ambulatory Visit: Payer: Medicare Other | Admitting: Hematology & Oncology

## 2015-09-24 ENCOUNTER — Non-Acute Institutional Stay (SKILLED_NURSING_FACILITY): Payer: Medicare Other | Admitting: Internal Medicine

## 2015-09-24 ENCOUNTER — Encounter: Payer: Self-pay | Admitting: Internal Medicine

## 2015-09-24 DIAGNOSIS — G894 Chronic pain syndrome: Secondary | ICD-10-CM | POA: Diagnosis not present

## 2015-09-24 DIAGNOSIS — E876 Hypokalemia: Secondary | ICD-10-CM | POA: Diagnosis not present

## 2015-09-24 DIAGNOSIS — S41109A Unspecified open wound of unspecified upper arm, initial encounter: Secondary | ICD-10-CM | POA: Insufficient documentation

## 2015-09-24 DIAGNOSIS — S81802D Unspecified open wound, left lower leg, subsequent encounter: Secondary | ICD-10-CM

## 2015-09-24 DIAGNOSIS — A419 Sepsis, unspecified organism: Secondary | ICD-10-CM | POA: Diagnosis not present

## 2015-09-24 DIAGNOSIS — E871 Hypo-osmolality and hyponatremia: Secondary | ICD-10-CM | POA: Diagnosis not present

## 2015-09-24 DIAGNOSIS — L930 Discoid lupus erythematosus: Secondary | ICD-10-CM

## 2015-09-24 DIAGNOSIS — S41102D Unspecified open wound of left upper arm, subsequent encounter: Secondary | ICD-10-CM

## 2015-09-24 DIAGNOSIS — S81801A Unspecified open wound, right lower leg, initial encounter: Secondary | ICD-10-CM | POA: Insufficient documentation

## 2015-09-24 DIAGNOSIS — S81801D Unspecified open wound, right lower leg, subsequent encounter: Secondary | ICD-10-CM

## 2015-09-24 DIAGNOSIS — M329 Systemic lupus erythematosus, unspecified: Secondary | ICD-10-CM

## 2015-09-24 DIAGNOSIS — S81809A Unspecified open wound, unspecified lower leg, initial encounter: Secondary | ICD-10-CM | POA: Insufficient documentation

## 2015-09-24 DIAGNOSIS — F1110 Opioid abuse, uncomplicated: Secondary | ICD-10-CM

## 2015-09-24 DIAGNOSIS — F1910 Other psychoactive substance abuse, uncomplicated: Secondary | ICD-10-CM | POA: Diagnosis not present

## 2015-09-24 DIAGNOSIS — Z765 Malingerer [conscious simulation]: Secondary | ICD-10-CM

## 2015-09-24 NOTE — Progress Notes (Signed)
MRN: [id number] Name: [name] Carr  Sex: female Age: 60 y.o. DOB: Jan 02, 1956  [place] #:  Facility/Room: [facility] / [address] P Level Of Care: SNF Provider: Noah Delaine. Sheppard Coil, MD Emergency Contacts: Extended Emergency Contact Information Primary Emergency Contact: [place], [place] [address] [place] Phone: [phone] Relation: Friend Secondary Emergency Contact: [place], [place] [address] [place] Phone: [phone] Relation: Friend  Code Status: Full Code  Allergies: Ammonia; Contrast media [iodinated diagnostic agents]; Iohexol; Methotrexate; Midazolam hcl; Nalbuphine; Bee venom; Hydroxychloroquine; Infliximab; Ketorolac tromethamine; Morphine and related; Nsaids; Penicillins; Pentazocine; Rofecoxib; Tetracyclines & related; and Celecoxib  Chief Complaint  Patient presents with  . Discharge Note    Discharged from SNF    HPI: Patient is 60 y.o. female with fibromyalgia, chronic pain, recurrent LE cellulitis who was admitted to [facility] from 8/5-11 for sepsis 2/2 BLE cellulitis, B knee wounds and L lateral thigh and L forearm wounds. Pt was admitted to SNF for generalized weakness, OT PT. Pt is insisting that she is going home today. She has 10 different reasons, her car, her dog, etc. Rehab is not comfortable releasing her and I agree. I have concern she won't take her antibiotics as needed. Today it was reported that she palmed one of her pain pills last night, then told the nurse today that the nurse last night didn't give her her pain pill, can she double up today?  Past Medical History:  Diagnosis Date  . Abnormal brain MRI   . Addison's disease ([place])   . ALLERGIC RHINITIS   . ANEMIA-IRON DEFICIENCY   . Anxiety   . At risk for falls   . Bacteremia   . Blood clot in vein 2011; 02/04/11   "right jugular vein;left jugular vein"  . CARCINOMA, BREAST 08/2009 dx   R breast,  mets to liver - resolution s/p  chemo, s/p B mastect  . Cephalgia   . Chronic constipation   . Chronic migraine w/o aura w/o status migrainosus, not intractable   . Chronic pain syndrome    chronic narcotics, dependancy hx - [place] for pain mgmt  . DDD (degenerative disc disease)   . Degenerative joint disease   . DEPRESSION   . DIABETES MELLITUS, TYPE II   . Disease of thyroid gland   . Fibromyalgia   . GERD   . HYPERLIPIDEMIA   . Hypertension   . Hypothyroidism pt states she has never had hy pothyroidism  . Leukocytosis   . Lumbar degenerative disc disease   . Lumbar radiculopathy   . Lupus erythematosus   . Lyme disease   . Metastases to the liver ([place])   . MIGRAINE HEADACHE   . MRSA (methicillin resistant staph aureus) culture positive   . Munchausen syndrome   . Myoclonus   . Narcotic abuse   . Neuropathy (HCC)    hands, feet, due to diabetes & back problem  . Opioid dependence ([place])   . Peripheral neuropathy ([place])   . PERSONALITY DISORDER   . Phlebitis after infusion    left ?calf; "S/P phenergan/demerol injection"  . Psoriasis   . RA (rheumatoid arthritis) ([place])   . SEIZURE DISORDER    "lots of seizures; due to lupus"  . SLE (systemic lupus erythematosus) (HCC)    rheum at [place] ([place]) - chronic pred  . Sleeping difficulties   . Small  vessel disease ([place])   . Speech disturbance   . Surgical wound infection 02/15/2011   R shoulder and mastectomy site - related to PICC  . Therapeutic drug monitoring     Past Surgical History:  Procedure Laterality Date  . APPLICATION OF WOUND VAC Left 05/23/2014   Procedure: APPLICATION OF WOUND VAC;  Surgeon: Meredith Pel, MD;  Location: [facility];  Service: Orthopedics;  Laterality: Left;  . BREAST SURGERY  09/2009   right breast biopsy  . I&D EXTREMITY  ~ 2010   right hand; S/P "dog bite"  . I&D EXTREMITY Left 05/09/2014   Procedure: IRRIGATION AND DEBRIDEMENT LEFT THIGH ABSCESS;  Surgeon: Meredith Pel, MD;  Location: [facility];  Service: Orthopedics;   Laterality: Left;  . I&D EXTREMITY Left 05/23/2014   Procedure: IRRIGATION AND DEBRIDEMENT EXTREMITY;  Surgeon: Meredith Pel, MD;  Location: [facility];  Service: Orthopedics;  Laterality: Left;  . INCISION AND DRAINAGE ABSCESS Left 12/22/2013   Procedure: INCISION AND DRAINAGE ABSCESS OF LEFT THIGH;  Surgeon: Donnie Mesa, MD;  Location: [facility];  Service: General;  Laterality: Left;  . liver bopsy  09/2009  . MASTECTOMY  11/19/10   bilateral mastectomy   . PERIPHERALLY INSERTED CENTRAL CATHETER INSERTION    . port a cath placement  09/2009; 11/19/10  . PORT-A-CATH REMOVAL  04/2010   "due to staph & strept"  . PORT-A-CATH REMOVAL  12/23/2010   Procedure: REMOVAL PORT-A-CATH;  Surgeon: Pedro Earls, MD;  Location: [facility];  Service: General;  Laterality: Left;  . spider bite        Medication List       Accurate as of 09/24/15  9:08 AM. Always use your most recent med list.          acyclovir 400 MG tablet Commonly known as:  ZOVIRAX Take 400 mg by mouth 3 (three) times daily. 6 am , noon and 6 pm   clonazePAM 1 MG tablet Commonly known as:  KLONOPIN Take 1 mg by mouth 2 (two) times daily as needed for anxiety. For up to 7 days Stop Date 09/26/15   doxycycline 100 MG tablet Commonly known as:  VIBRA-TABS Take 100 mg by mouth 3 (three) times daily.   DULoxetine 60 MG capsule Commonly known as:  CYMBALTA Take 1 capsule (60 mg total) by mouth 2 (two) times daily.   EPIPEN 2-PAK 0.3 mg/0.3 mL [place] injection Generic drug:  EPINEPHrine Inject 0.3 mg into the muscle once. Inject utd if bitten by insect   EQL VITAMIN D3 2000 units Caps Generic drug:  Cholecalciferol Take 1 capsule by mouth daily.   furosemide 20 MG tablet Commonly known as:  LASIX Take 20 mg by mouth daily.   gabapentin 800 MG tablet Commonly known as:  NEURONTIN Take 800 mg by mouth 3 (three) times daily.   HYDROcodone-acetaminophen 10-325 MG tablet Commonly known as:  NORCO Take 1 tablet by mouth  every 6 (six) hours. For pain, for up to 7 days   LACTINEX Pack Take 1 each by mouth 2 (two) times daily.   LINZESS 290 MCG Caps capsule Generic drug:  linaclotide Take 290 mcg by mouth daily before breakfast.   metoprolol tartrate 25 MG tablet Commonly known as:  LOPRESSOR Take 25 mg by mouth 2 (two) times daily.   multivitamin with minerals tablet Take 1 tablet by mouth daily.   ondansetron 8 MG tablet Commonly known as:  ZOFRAN Take 8 mg by mouth every 8 (  eight) hours as needed for nausea or vomiting.   pantoprazole 40 MG tablet Commonly known as:  PROTONIX Take 40 mg by mouth 2 (two) times daily.   polyethylene glycol packet Commonly known as:  MIRALAX / GLYCOLAX Take 17 g by mouth daily.   polyvinyl alcohol 1.4 % ophthalmic solution Commonly known as:  LIQUIFILM TEARS Place 1 drop into both eyes every 2 (two) hours as needed for dry eyes.   potassium chloride 20 MEQ packet Commonly known as:  KLOR-CON Take 20 mEq by mouth daily.   predniSONE 5 MG tablet Commonly known as:  DELTASONE Take 5 mg by mouth daily with breakfast.   promethazine 25 MG tablet Commonly known as:  PHENERGAN Take 25 mg by mouth every 6 (six) hours as needed for nausea or vomiting.   Protein Powd Take 2 scoop by mouth 2 (two) times daily.   rOPINIRole 2 MG tablet Commonly known as:  REQUIP Take 2 mg by mouth at bedtime.   senna-docusate 8.6-50 MG tablet Commonly known as:  Senokot-S Take 2 tablets by mouth 2 (two) times daily.   spironolactone 50 MG tablet Commonly known as:  ALDACTONE Take 50 mg by mouth daily.   tiZANidine 4 MG tablet Commonly known as:  ZANAFLEX Take 4 mg by mouth 3 (three) times daily. Take at 6 am , noon, and 6 pm   traZODone 50 MG tablet Commonly known as:  DESYREL Take 50 mg by mouth at bedtime.       Meds ordered this encounter  Medications  . doxycycline (VIBRA-TABS) 100 MG tablet    Sig: Take 100 mg by mouth 3 (three) times daily.  .  Lactobacillus (LACTINEX) PACK    Sig: Take 1 each by mouth 2 (two) times daily.  Marland Kitchen spironolactone (ALDACTONE) 50 MG tablet    Sig: Take 50 mg by mouth daily.    Immunization History  Administered Date(s) Administered  . Influenza Split 01/08/2012  . Influenza,inj,Quad PF,36+ Mos 01/01/2013  . PPD Test 09/18/2015  . Pneumococcal Polysaccharide-23 02/08/2008  . Td 02/07/2006  . Tdap 12/12/2014    Social History  Substance Use Topics  . Smoking status: Never Smoker  . Smokeless tobacco: Never Used     Comment: Never used tobacco  . Alcohol use No    Vitals:   09/24/15 0849  BP: 128/65  Pulse: 72  Resp: 18  Temp: 97.5 F (36.4 C)    Physical Exam  GENERAL APPEARANCE: Alert, conversant. No acute distress.  HEENT: Unremarkable. RESPIRATORY: Breathing is even, unlabored. Lung sounds are clear   CARDIOVASCULAR: Heart RRR no murmurs, rubs or gallops. No peripheral edema.  GASTROINTESTINAL: Abdomen is soft, non-tender, not distended w/ normal bowel sounds.  NEUROLOGIC: Cranial nerves 2-12 grossly intact. Moves all extremities  Patient Active Problem List   Diagnosis Date Noted  . Sepsis ([place]) 09/21/2015  . Abnormal brain MRI   . At risk for falls   . Bacteremia   . Cephalgia   . Chronic migraine w/o aura w/o status migrainosus, not intractable   . Disease of thyroid gland   . Fibromyalgia   . Leukocytosis   . Lumbar degenerative disc disease   . Lumbar radiculopathy   . Lupus erythematosus   . MRSA (methicillin resistant staph aureus) culture positive   . Myoclonus   . Narcotic abuse   . Opioid dependence ([place])   . Peripheral neuropathy ([place])   . Therapeutic drug monitoring   . Fever   . Somnolence  05/25/2014  . Hypotension 05/25/2014  . FUO (fever of unknown origin)   . Anemia 05/24/2014  . Thigh abscess 05/23/2014  . Abscess   . MRSA infection   . Munchausen syndrome   . Malingering   . Mixed personality disorder   . 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 Drug-seeking behavior   . Infection 05/09/2014  . Abscess of left thigh 05/08/2014  . Diabetes mellitus type 2, controlled ([place]) 05/08/2014  . 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 Neuropathy ([place]) 08/28/2013  . Hypertension   . Lower extremity cellulitis 08/22/2013  . Hypokalemia 08/22/2013  . Syncope 01/11/2012  . Hyponatremia 12/17/2011  . SLE-on prednisone-Follow at [facility] 02/05/2011  . DVT L IJ 12/26/10 02/04/2011  . Normocytic anemia 12/27/2010  . Malignant neoplasm of female breast ([place]) 09/07/2009  . Type 2 diabetes mellitus ([place]) 09/07/2009  . HYPERLIPIDEMIA 09/07/2009  . Iron deficiency anemia 09/07/2009  . Chronic pain syndrome 09/07/2009  . MIGRAINE HEADACHE 09/07/2009  . ALLERGIC RHINITIS 09/07/2009  . GERD 09/07/2009  . SEIZURE DISORDER 09/07/2009  . PERSONALITY DISORDER 09/01/2009  . Major depression, recurrent, chronic ([place]) 09/01/2009  . RENAL INSUFFICIENCY 09/01/2009    CBC    Component Value Date/Time   WBC 6.7 09/16/2015   WBC 4.3 12/26/2014 0649   RBC 3.71 (L) 12/26/2014 0649   HGB 10.1 (A) 09/16/2015   HGB 12.3 08/08/2014 1324   HGB 8.4 (L) 04/08/2013 1148   HCT 32 (A) 09/16/2015   HCT 38.3 08/08/2014 1324   HCT 27.7 (L) 04/08/2013 1148   PLT 276 09/16/2015   PLT 305 08/08/2014 1324   PLT 289 04/08/2013 1148   MCV 89.5 12/26/2014 0649   MCV 90 08/08/2014 1324   MCV 72.4 (L) 04/08/2013 1148   LYMPHSABS 1.6 12/15/2014 0726   LYMPHSABS 0.8 (L) 08/08/2014 1324   LYMPHSABS 0.6 (L) 04/08/2013 1148   MONOABS 0.6 12/15/2014 0726   MONOABS 0.6 04/08/2013 1148   EOSABS 0.1 12/15/2014 0726   EOSABS 0.1 08/08/2014 1324   BASOSABS 0.0 12/15/2014 0726   BASOSABS 0.0 08/08/2014 1324   BASOSABS 0.1 04/08/2013 1148    CMP     Component Value Date/Time   NA 138 09/16/2015   NA 135 04/25/2013 1439   NA 135 (L) 04/08/2013 1148   K 3.9 09/16/2015   K 3.5 04/25/2013 1439   K 4.6 04/08/2013 1148   CL 104  12/26/2014 0649   CL 90 (L) 04/25/2013 1439   CL 91 (L) 03/15/2012 1410   CO2 30 12/26/2014 0649   CO2 32 04/25/2013 1439   CO2 28 04/08/2013 1148   GLUCOSE 95 12/26/2014 0649   GLUCOSE 201 (H) 04/25/2013 1439   GLUCOSE 131 (H) 03/15/2012 1410   BUN 18 09/16/2015   BUN 26 (H) 04/25/2013 1439   BUN 29.9 (H) 04/08/2013 1148   CREATININE 1.0 09/16/2015   CREATININE 0.83 12/26/2014 0649   CREATININE 0.94 09/11/2013 1718   CREATININE 1.2 (H) 04/08/2013 1148   CALCIUM 9.4 12/26/2014 0649   CALCIUM 9.9 04/25/2013 1439   CALCIUM 9.6 04/08/2013 1148   PROT 6.3 (L) 12/15/2014 0726   PROT 8.5 (H) 04/25/2013 1439   PROT 6.9 04/08/2013 1148   ALBUMIN 3.2 (L) 12/15/2014 0726   ALBUMIN 4.1 04/25/2013 1439   ALBUMIN 3.4 (L) 04/08/2013 1148   AST 27 09/12/2015   AST 21 04/25/2013 1439   AST 17 04/08/2013 1148   ALT 17 09/12/2015  ALT 36 04/25/2013 1439   ALT 18 04/08/2013 1148   ALKPHOS 153 (A) 09/12/2015   ALKPHOS 102 (H) 04/25/2013 1439   ALKPHOS 100 04/08/2013 1148   BILITOT 0.3 12/15/2014 0726   BILITOT 0.70 04/25/2013 1439   BILITOT 0.34 04/08/2013 1148   GFRNONAA >60 12/26/2014 0649   GFRNONAA 67 09/11/2013 1718   GFRAA >60 12/26/2014 0649   GFRAA 77 09/11/2013 1718    Assessment and Plan  Pt is d/c to home with HH/OT/PT/Nursing. Medications have been reconciled and Rx's written. I will be writing a limited supply of her narcotic pain meds.   Time spent > 30 min;> 50% of time with patient was spent reviewing records, labs, tests and studies, counseling and developing plan of care  Noah Delaine. Sheppard Coil, MD

## 2015-10-27 ENCOUNTER — Other Ambulatory Visit: Payer: Self-pay | Admitting: Internal Medicine

## 2015-10-28 ENCOUNTER — Other Ambulatory Visit: Payer: Self-pay | Admitting: Internal Medicine

## 2015-11-04 ENCOUNTER — Other Ambulatory Visit: Payer: Medicare Other

## 2015-11-04 ENCOUNTER — Ambulatory Visit: Payer: Medicare Other | Admitting: Hematology & Oncology

## 2015-12-06 ENCOUNTER — Other Ambulatory Visit: Payer: Self-pay | Admitting: Internal Medicine

## 2015-12-13 LAB — AFB CULTURE WITH SMEAR (NOT AT ARMC)

## 2015-12-14 ENCOUNTER — Ambulatory Visit (HOSPITAL_BASED_OUTPATIENT_CLINIC_OR_DEPARTMENT_OTHER): Payer: Medicare Other | Admitting: Hematology & Oncology

## 2015-12-14 ENCOUNTER — Other Ambulatory Visit (HOSPITAL_BASED_OUTPATIENT_CLINIC_OR_DEPARTMENT_OTHER): Payer: Medicare Other

## 2015-12-14 VITALS — BP 133/61 | HR 68 | Temp 98.4°F | Resp 16 | Ht 64.0 in | Wt 165.0 lb

## 2015-12-14 DIAGNOSIS — Z853 Personal history of malignant neoplasm of breast: Secondary | ICD-10-CM

## 2015-12-14 DIAGNOSIS — R911 Solitary pulmonary nodule: Secondary | ICD-10-CM

## 2015-12-14 DIAGNOSIS — C50011 Malignant neoplasm of nipple and areola, right female breast: Secondary | ICD-10-CM

## 2015-12-14 DIAGNOSIS — R918 Other nonspecific abnormal finding of lung field: Secondary | ICD-10-CM

## 2015-12-14 DIAGNOSIS — C50911 Malignant neoplasm of unspecified site of right female breast: Secondary | ICD-10-CM

## 2015-12-14 LAB — CBC WITH DIFFERENTIAL (CANCER CENTER ONLY)
BASO#: 0 10*3/uL (ref 0.0–0.2)
BASO%: 0.2 % (ref 0.0–2.0)
EOS%: 0.5 % (ref 0.0–7.0)
Eosinophils Absolute: 0.1 10*3/uL (ref 0.0–0.5)
HCT: 37.8 % (ref 34.8–46.6)
HGB: 11.8 g/dL (ref 11.6–15.9)
LYMPH#: 0.7 10*3/uL — ABNORMAL LOW (ref 0.9–3.3)
LYMPH%: 7.5 % — AB (ref 14.0–48.0)
MCH: 26.5 pg (ref 26.0–34.0)
MCHC: 31.2 g/dL — ABNORMAL LOW (ref 32.0–36.0)
MCV: 85 fL (ref 81–101)
MONO#: 0.5 10*3/uL (ref 0.1–0.9)
MONO%: 4.9 % (ref 0.0–13.0)
NEUT#: 8.6 10*3/uL — ABNORMAL HIGH (ref 1.5–6.5)
NEUT%: 86.9 % — AB (ref 39.6–80.0)
PLATELETS: 213 10*3/uL (ref 145–400)
RBC: 4.46 10*6/uL (ref 3.70–5.32)
RDW: 16.9 % — AB (ref 11.1–15.7)
WBC: 9.9 10*3/uL (ref 3.9–10.0)

## 2015-12-14 LAB — COMPREHENSIVE METABOLIC PANEL (CC13)
ALT: 10 IU/L (ref 0–32)
AST: 17 IU/L (ref 0–40)
Albumin, Serum: 4.3 g/dL (ref 3.6–4.8)
Albumin/Globulin Ratio: 1.4 (ref 1.2–2.2)
Alkaline Phosphatase, S: 112 IU/L (ref 39–117)
BUN/Creatinine Ratio: 21 (ref 12–28)
BUN: 19 mg/dL (ref 8–27)
Bilirubin Total: 0.7 mg/dL (ref 0.0–1.2)
CALCIUM: 9.1 mg/dL (ref 8.7–10.3)
CO2: 27 mmol/L (ref 18–29)
CREATININE: 0.89 mg/dL (ref 0.57–1.00)
Chloride, Ser: 94 mmol/L — ABNORMAL LOW (ref 96–106)
GFR calc Af Amer: 81 mL/min/{1.73_m2} (ref >59)
GFR, EST NON AFRICAN AMERICAN: 70 mL/min/{1.73_m2} (ref >59)
Globulin, Total: 3 g/dL (ref 1.5–4.5)
Glucose: 156 mg/dL — ABNORMAL HIGH (ref 65–99)
POTASSIUM: 3.7 mmol/L (ref 3.5–5.2)
Sodium: 131 mmol/L — ABNORMAL LOW (ref 134–144)
Total Protein: 7.3 g/dL (ref 6.0–8.5)
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
## 2016-01-20 ENCOUNTER — Other Ambulatory Visit: Payer: Self-pay | Admitting: Internal Medicine

## 2016-01-22 ENCOUNTER — Other Ambulatory Visit: Payer: Medicare Other

## 2016-01-22 ENCOUNTER — Ambulatory Visit: Payer: Medicare Other | Admitting: Hematology & Oncology

## 2016-01-22 ENCOUNTER — Ambulatory Visit (HOSPITAL_BASED_OUTPATIENT_CLINIC_OR_DEPARTMENT_OTHER): Payer: Medicare Other

## 2016-02-19 ENCOUNTER — Telehealth: Payer: Self-pay | Admitting: Hematology & Oncology

## 2016-02-19 ENCOUNTER — Other Ambulatory Visit: Payer: Medicare Other

## 2016-02-19 ENCOUNTER — Ambulatory Visit: Payer: Medicare Other | Admitting: Hematology & Oncology

## 2016-02-19 ENCOUNTER — Ambulatory Visit (HOSPITAL_BASED_OUTPATIENT_CLINIC_OR_DEPARTMENT_OTHER): Payer: Medicare Other

## 2016-02-19 NOTE — Telephone Encounter (Signed)
Patient called and cx lab/md apt and Ct Chest apt for today due to being sick.  She stated she would call back to resch.  Rn Roselyn Reef was notified

## 2016-03-08 ENCOUNTER — Other Ambulatory Visit: Payer: Self-pay | Admitting: Hematology & Oncology

## 2016-03-08 DIAGNOSIS — R911 Solitary pulmonary nodule: Secondary | ICD-10-CM

## 2016-03-10 ENCOUNTER — Ambulatory Visit: Payer: Medicare Other | Admitting: Hematology & Oncology

## 2016-03-10 ENCOUNTER — Other Ambulatory Visit: Payer: Medicare Other

## 2016-04-22 IMAGING — CT CT FEMUR *L* W/O CM
3 series · 10 of 35 positions shown, 11 images · non-contrast
Comparison: Ultrasound 12/21/2013.  CT 08/23/2013.

CLINICAL DATA: Left upper thigh cellulitis. Previously treated with
antibiotics and irrigation and drainage 4 months ago. Additional
irrigation and drainage 12/22/2013. History of multiple medical
problems including systemic lupus erythematosus, chronic pain
syndrome, hypertension, diabetes and Sjogren syndrome. Subsequent
encounter.

EXAM:
CT OF THE LEFT FEMUR WITHOUT CONTRAST
TECHNIQUE: Multidetector CT imaging was performed according to the standard
protocol. Multiplanar CT image reconstructions were also generated.

[Series 5: lfov ext 3.0 b40s · axial · 0.67mm/px · z∈[+719,+983]mm · 2 of 192 slices shown, 3 images]
[im 59/192  soft-tissue]
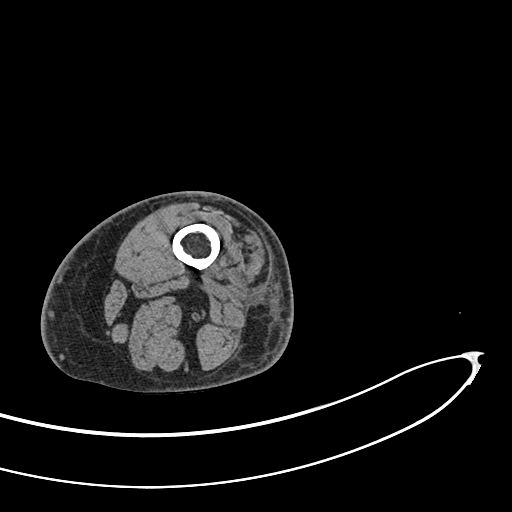
[im 59/192  bone]
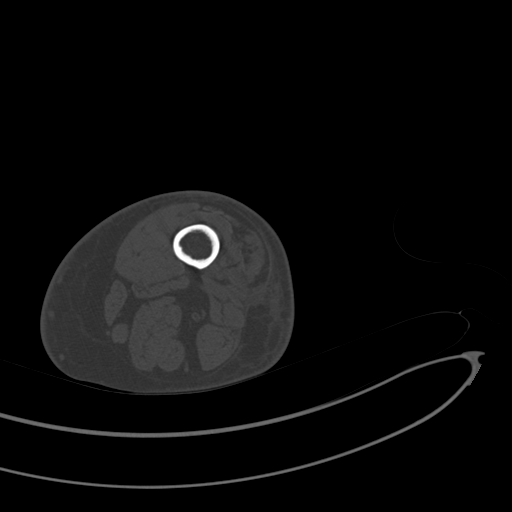
[im 147/192  bone]
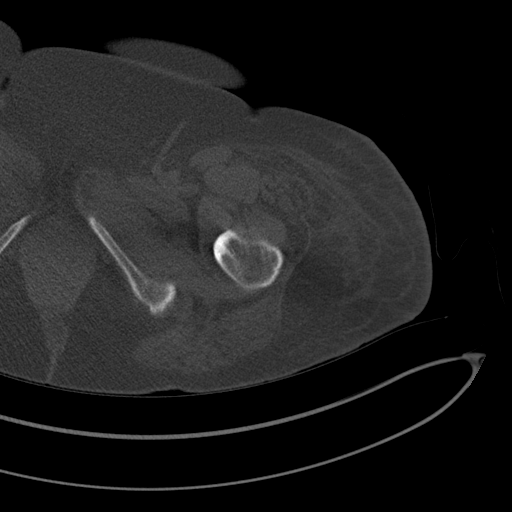

[Series 7: sagittal bone · sagittal · 0.53mm/px · 5 of 106 slices shown]
[im 36/106  bone]
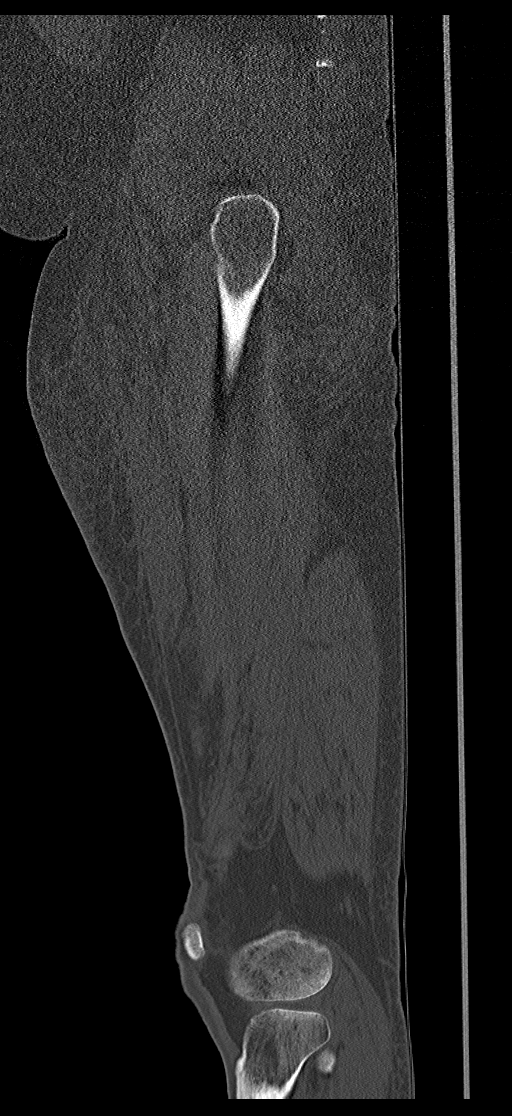
[im 44/106  bone]
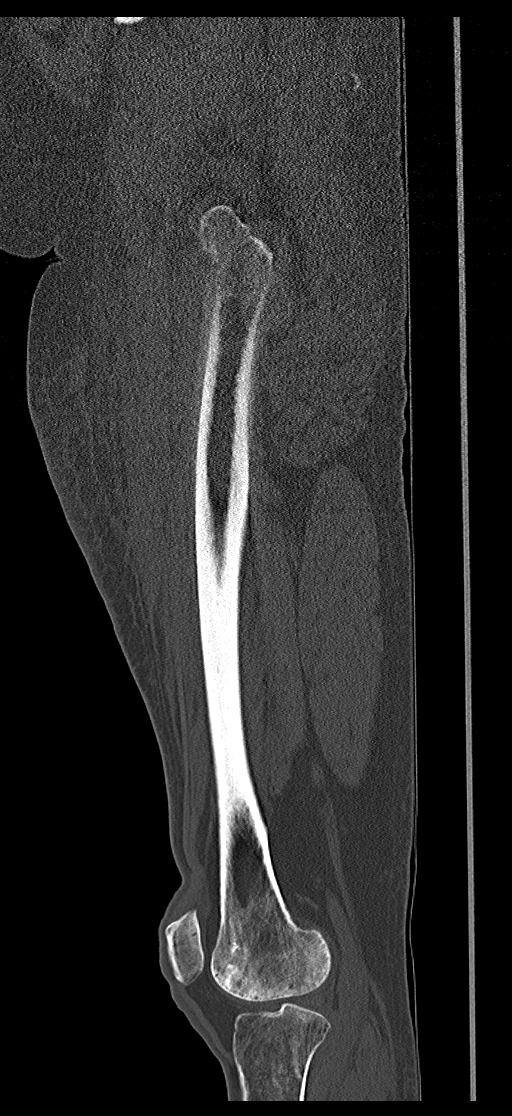
[im 53/106  bone]
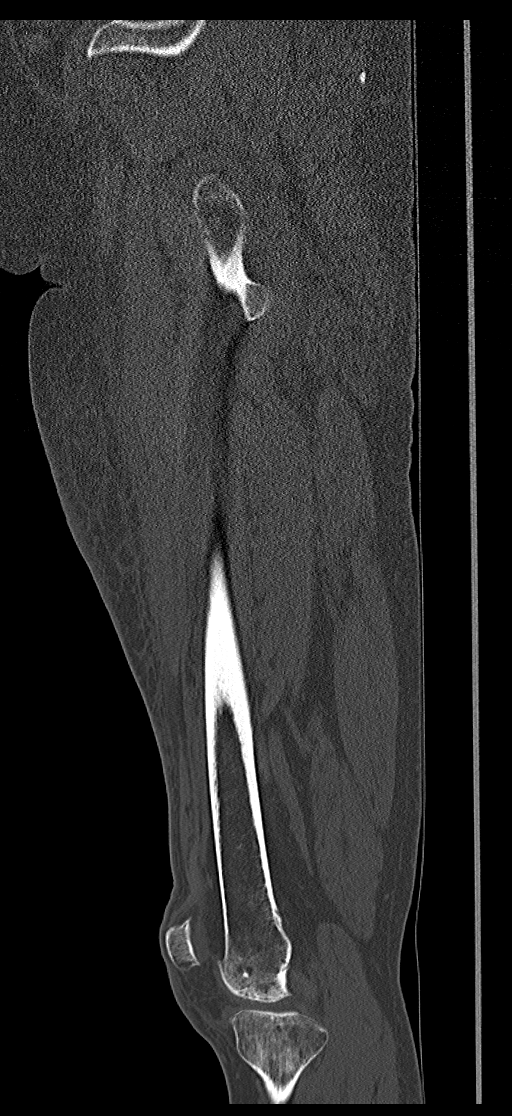
[im 62/106  bone]
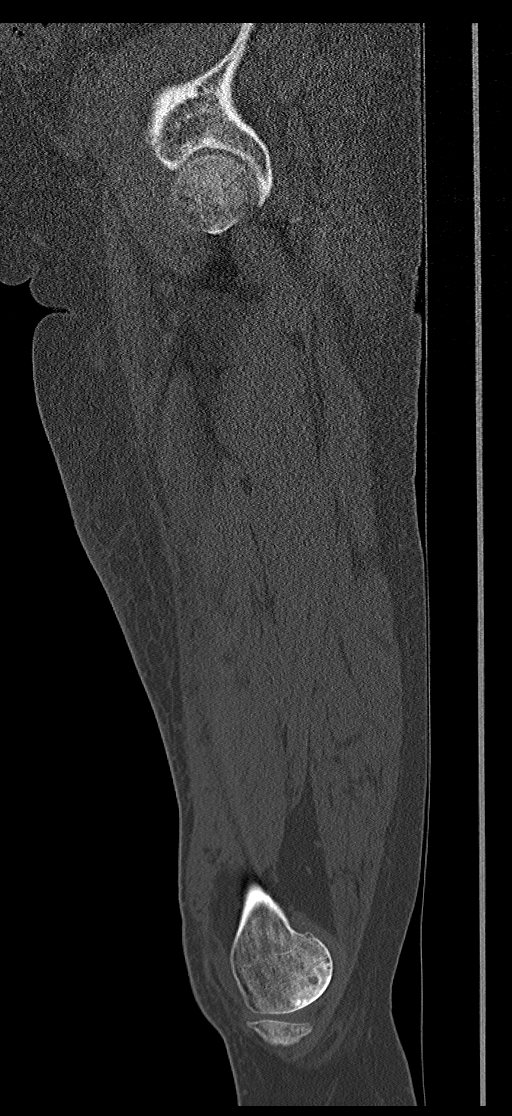
[im 71/106  bone]
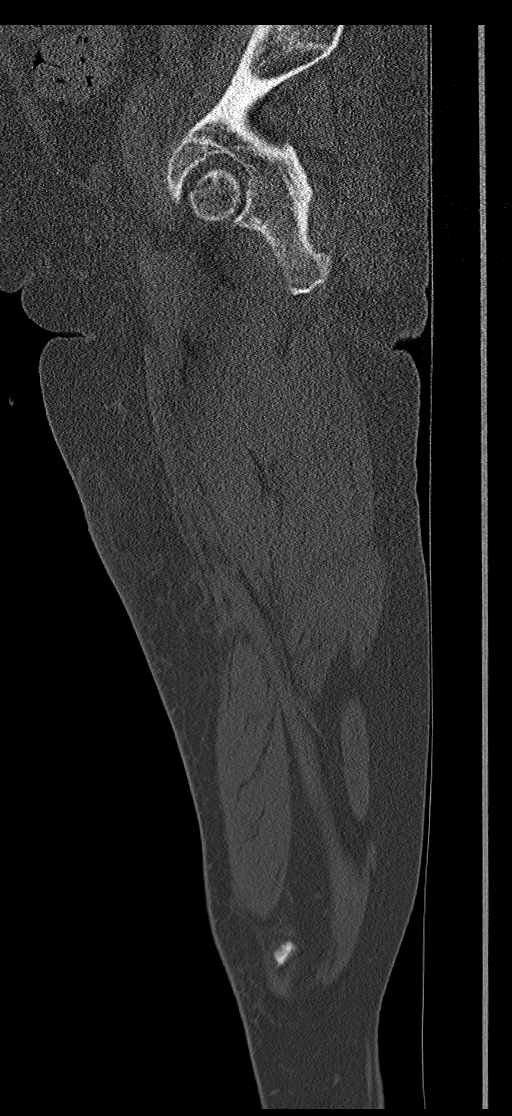

[Series 9: coronalsoft tissue · coronal · 0.55mm/px · 3 of 108 slices shown]
[im 22/108  bone]
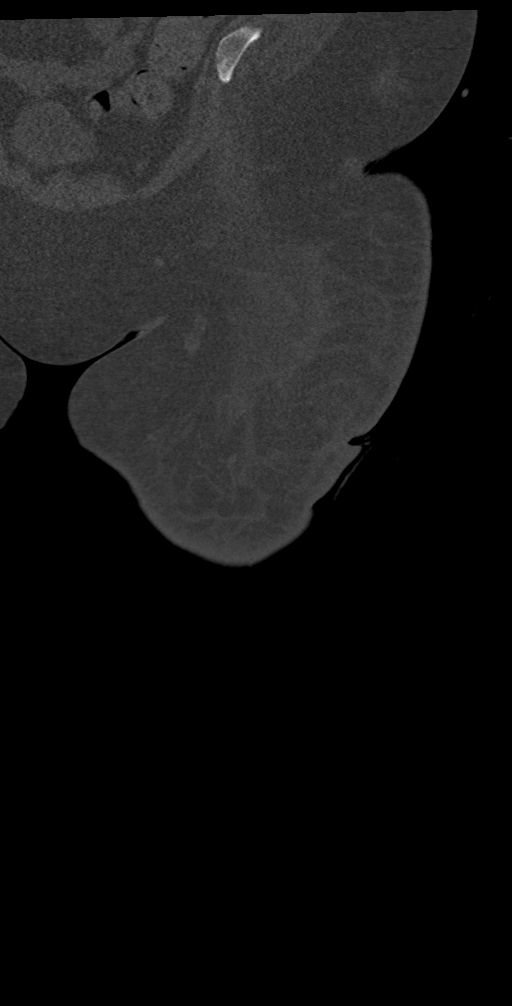
[im 43/108  bone]
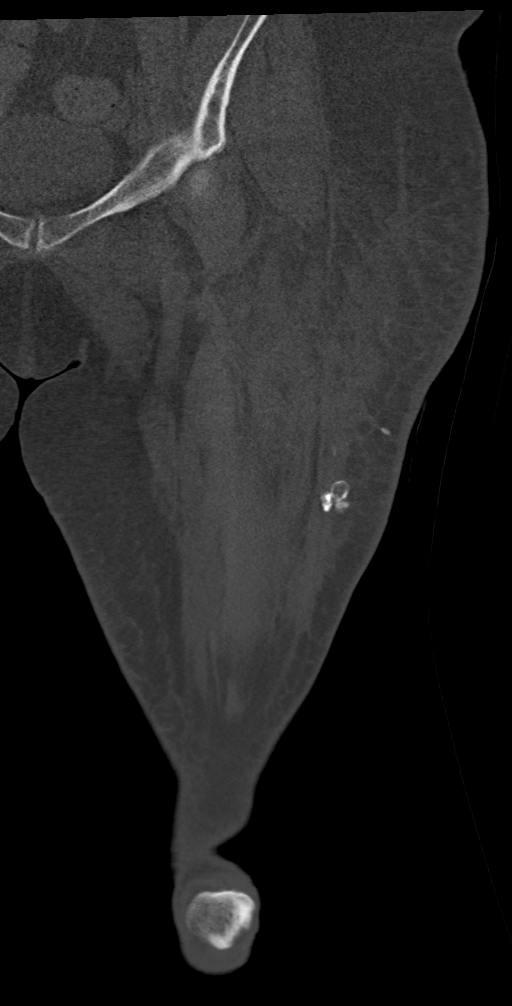
[im 65/108  bone]
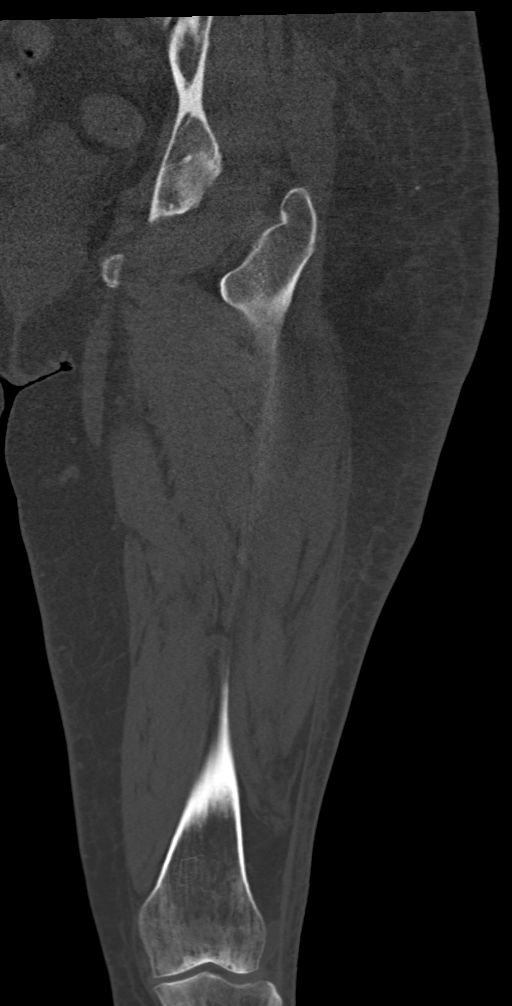

[10 of 35 positions shown; findings below may reference images not displayed]

FINDINGS: There is a 3.4 cm open wound anterolaterally in the mid left thigh
at the site of the patient's recent procedure. This is superficial
to multiple coarse calcifications within the subcutaneous fat which
are chronic and unchanged. There is soft tissue stranding throughout
the surrounding subcutaneous fat without focal fluid collection. No
definite underlying inflammatory change or fluid collection is seen
within the quadriceps musculature.

Chronic calcifications are present within the subcutaneous fat of
both buttocks.

Sacroiliac degenerative changes are present bilaterally. The left
hip appears unremarkable. There is irregular subchondral sclerosis
and cyst formation in both femoral condyles at the knee suspicious
for chronic osteonecrosis. There is no evidence of acute fracture or
bone destruction.
IMPRESSION: 1. Recent irrigation and debridement in the anterolateral left thigh
with open wound. There are surrounding inflammatory changes in the
subcutaneous fat, but no residual fluid collection identified.
2. Chronic soft tissue calcifications attributed to the patient's
connective tissue disorder.
3. No evidence of osteomyelitis.
4. Suspected chronic osteonecrosis at the knee.

## 2016-04-26 LAB — HEPATIC FUNCTION PANEL
ALT: 15 U/L (ref 7–35)
AST: 19 U/L (ref 13–35)
Alkaline Phosphatase: 76 U/L (ref 25–125)
BILIRUBIN, TOTAL: 0.5 mg/dL

## 2016-04-26 LAB — BASIC METABOLIC PANEL
BUN: 14 mg/dL (ref 4–21)
Creatinine: 0.9 mg/dL (ref 0.5–1.1)
GLUCOSE: 95 mg/dL
Potassium: 3.5 mmol/L (ref 3.4–5.3)
Potassium: 3.6 mmol/L (ref 3.4–5.3)
Sodium: 138 mmol/L (ref 137–147)

## 2016-04-26 LAB — CBC AND DIFFERENTIAL
HEMATOCRIT: 12 % — AB (ref 36–46)
HEMATOCRIT: 38 % (ref 36–46)
Hemoglobin: 12.3 g/dL (ref 12.0–16.0)
PLATELETS: 219 10*3/uL (ref 150–399)
WBC: 7.9 10*3/mL

## 2016-04-27 LAB — BASIC METABOLIC PANEL
BUN: 13 mg/dL (ref 4–21)
CREATININE: 1 mg/dL (ref 0.5–1.1)
Glucose: 111 mg/dL
Potassium: 3.7 mmol/L (ref 3.4–5.3)
Sodium: 137 mmol/L (ref 137–147)

## 2016-04-27 LAB — CBC AND DIFFERENTIAL
HEMATOCRIT: 33 % — AB (ref 36–46)
Hemoglobin: 10.5 g/dL — AB (ref 12.0–16.0)
PLATELETS: 217 10*3/uL (ref 150–399)
WBC: 7.9 10^3/mL

## 2016-04-29 ENCOUNTER — Other Ambulatory Visit: Payer: Medicare Other

## 2016-04-29 ENCOUNTER — Ambulatory Visit (HOSPITAL_BASED_OUTPATIENT_CLINIC_OR_DEPARTMENT_OTHER): Payer: Medicare Other

## 2016-04-29 ENCOUNTER — Ambulatory Visit: Payer: Medicare Other | Admitting: Hematology & Oncology

## 2016-05-02 ENCOUNTER — Encounter: Payer: Self-pay | Admitting: Internal Medicine

## 2016-05-02 ENCOUNTER — Non-Acute Institutional Stay (SKILLED_NURSING_FACILITY): Payer: Medicare Other | Admitting: Internal Medicine

## 2016-05-02 DIAGNOSIS — F339 Major depressive disorder, recurrent, unspecified: Secondary | ICD-10-CM

## 2016-05-02 DIAGNOSIS — I10 Essential (primary) hypertension: Secondary | ICD-10-CM | POA: Diagnosis not present

## 2016-05-02 DIAGNOSIS — F11282 Opioid dependence with opioid-induced sleep disorder: Secondary | ICD-10-CM

## 2016-05-02 DIAGNOSIS — E271 Primary adrenocortical insufficiency: Secondary | ICD-10-CM | POA: Diagnosis not present

## 2016-05-02 DIAGNOSIS — G629 Polyneuropathy, unspecified: Secondary | ICD-10-CM | POA: Diagnosis not present

## 2016-05-02 DIAGNOSIS — G894 Chronic pain syndrome: Secondary | ICD-10-CM

## 2016-05-02 DIAGNOSIS — F1110 Opioid abuse, uncomplicated: Secondary | ICD-10-CM | POA: Diagnosis not present

## 2016-05-02 DIAGNOSIS — L03116 Cellulitis of left lower limb: Secondary | ICD-10-CM | POA: Diagnosis not present

## 2016-05-02 DIAGNOSIS — L03115 Cellulitis of right lower limb: Secondary | ICD-10-CM

## 2016-05-02 DIAGNOSIS — S81802D Unspecified open wound, left lower leg, subsequent encounter: Secondary | ICD-10-CM

## 2016-05-02 DIAGNOSIS — E876 Hypokalemia: Secondary | ICD-10-CM | POA: Diagnosis not present

## 2016-05-02 NOTE — Progress Notes (Signed)
: Provider:  Noah Delaine. Sheppard Coil, MD Location:  [facility] Room Number: [phone] Place of Service:  SNF (31)  PCP: [name], MD Patient Care Team: [name], MD as PCP - General (Internal Medicine) Ramon Dredge, MD (Inactive) as Consulting Physician (Orthopedic Surgery) Max Paulla Dolly, MD as Consulting Physician (Orthopedic Surgery) Annia Belt, MD as Consulting Physician (Hematology and Oncology)  Extended Emergency Contact Information Primary Emergency Contact: [place], [place] [address] [place] Phone: [phone] Relation: Friend Secondary Emergency Contact: [place], [place] [address] [place] Phone: [phone] Relation: Friend     Allergies: Ammonia; Contrast media [iodinated diagnostic agents]; Iohexol; Methotrexate; Midazolam hcl; Nalbuphine; Bee venom; Hydroxychloroquine; Infliximab; Ketorolac tromethamine; Morphine and related; Nsaids; Penicillins; Pentazocine; Rofecoxib; Tetracyclines & related; Celecoxib; and Midazolam  Chief Complaint  Patient presents with  . New Admit To SNF    Admit to Facility    HPI: Patient is 61 y.o. female with Hypertension, opioid dependence, chronic pain syndrome, and chronic bilateral lower extremity ulcers, who was admitted to [facility] from March 20-24 for worsening erythema and increased drainage from bilateral lower extremities. Patient had a PICC line placed due to poor access she was started on IV clindamycin and seen by wound care. Both lower extension regional wrapped in Ace bandage and improved. I D recommended DALBAVANCIN 1500 mg IV once prior to discharge. Hospital course was compensated by hypokalemia which was repleted and of course by chronic neck pain and chronic pain syndrome with opioid syndrome which was treated with IV narcotics patient is admitted to skilled nursing facility with  generalized weakness for OT/PT. While at skilled nursing facility patient will be treated with for hypertrophic tension treated with hydrochlorothiazide, lasix and metoprolol, neuropathy treated with Neurontin, and depression treated with Cymbalta.  Past Medical History:  Diagnosis Date  . Abnormal brain MRI   . Addison's disease ([place])   . ALLERGIC RHINITIS   . ANEMIA-IRON DEFICIENCY   . Anxiety   . At risk for falls   . Bacteremia   . Blood clot in vein 2011; 02/04/11   "right jugular vein;left jugular vein"  . CARCINOMA, BREAST 08/2009 dx   R breast,  mets to liver - resolution s/p chemo, s/p B mastect  . Cephalgia   . Chronic constipation   . Chronic migraine w/o aura w/o status migrainosus, not intractable   . Chronic pain syndrome    chronic narcotics, dependancy hx - [facility] for pain mgmt  . DDD (degenerative disc disease)   . Degenerative joint disease   . DEPRESSION   . DIABETES MELLITUS, TYPE II   . Disease of thyroid gland   . Fibromyalgia   . GERD   . HYPERLIPIDEMIA   . Hypertension   . Hypothyroidism pt states she has never had hy pothyroidism  . Leukocytosis   . Lumbar degenerative disc disease   . Lumbar radiculopathy   . Lupus erythematosus   . Lyme disease   . 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 MIGRAINE HEADACHE   . MRSA (methicillin resistant staph aureus) culture positive   . Munchausen syndrome   . Myoclonus   . Narcotic abuse   . Neuropathy (HCC)    hands, feet, due to diabetes & back problem  . Opioid dependence ([place])   . Peripheral  neuropathy ([place])   . PERSONALITY DISORDER   . Phlebitis after infusion    left ?calf; "S/P phenergan/demerol injection"  . Psoriasis   . RA (rheumatoid arthritis) ([place])   . SEIZURE DISORDER    "lots of seizures; due to lupus"  . SLE (systemic lupus erythematosus) (HCC)    rheum at [place] ([place]) - chronic pred  . Sleeping difficulties   . Small vessel disease   . Speech disturbance   . Surgical wound infection  02/15/2011   R shoulder and mastectomy site - related to PICC  . Therapeutic drug monitoring     Past Surgical History:  Procedure Laterality Date  . APPLICATION OF WOUND VAC Left 05/23/2014   Procedure: APPLICATION OF WOUND VAC;  Surgeon: Meredith Pel, MD;  Location: [facility];  Service: Orthopedics;  Laterality: Left;  . BREAST SURGERY  09/2009   right breast biopsy  . I&D EXTREMITY  ~ 2010   right hand; S/P "dog bite"  . I&D EXTREMITY Left 05/09/2014   Procedure: IRRIGATION AND DEBRIDEMENT LEFT THIGH ABSCESS;  Surgeon: Meredith Pel, MD;  Location: [facility];  Service: Orthopedics;  Laterality: Left;  . I&D EXTREMITY Left 05/23/2014   Procedure: IRRIGATION AND DEBRIDEMENT EXTREMITY;  Surgeon: Meredith Pel, MD;  Location: [facility];  Service: Orthopedics;  Laterality: Left;  . INCISION AND DRAINAGE ABSCESS Left 12/22/2013   Procedure: INCISION AND DRAINAGE ABSCESS OF LEFT THIGH;  Surgeon: Donnie Mesa, MD;  Location: [facility];  Service: General;  Laterality: Left;  . liver bopsy  09/2009  . MASTECTOMY  11/19/10   bilateral mastectomy   . PERIPHERALLY INSERTED CENTRAL CATHETER INSERTION    . port a cath placement  09/2009; 11/19/10  . PORT-A-CATH REMOVAL  04/2010   "due to staph & strept"  . PORT-A-CATH REMOVAL  12/23/2010   Procedure: REMOVAL PORT-A-CATH;  Surgeon: Pedro Earls, MD;  Location: [facility];  Service: General;  Laterality: Left;  . spider bite      Allergies as of 05/02/2016      Reactions   Ammonia Other (See Comments)   Other reaction(s): ASTHMA Ended up on ventilator from ammonia tabs   Contrast Media [iodinated Diagnostic Agents] Other (See Comments)   Other reaction(s): DIFFICULTY BREATHING Doesn't breath well.   Iohexol Other (See Comments)   SOB   Methotrexate Anaphylaxis   Midazolam Hcl Anaphylaxis   Nalbuphine Other (See Comments)   Can't breathe well   Bee Venom    Hydroxychloroquine    Infliximab Nausea And Vomiting   Ketorolac Tromethamine Other  (See Comments)   Injectable doesn't work and pill hurts stomach.   Morphine And Related Hives   Nsaids Nausea And Vomiting   Penicillins    Pentazocine    Rofecoxib    Tetracyclines & Related    Other reaction(s): ANAPHYLAXIS   Celecoxib Rash   Midazolam Anxiety   Other reaction(s): CAUSES ANXIETY      Medication List       Accurate as of 05/02/16  9:16 AM. Always use your most recent med list.          clonazePAM 0.5 MG tablet Commonly known as:  KLONOPIN Take 0.5 mg by mouth 2 (two) times daily as needed for anxiety.   DULoxetine 60 MG capsule Commonly known as:  CYMBALTA Take 1 capsule (60 mg total) by mouth 2 (two) times daily.   furosemide 20 MG tablet Commonly known as:  LASIX Take 20 mg by mouth daily.  gabapentin 800 MG tablet Commonly known as:  NEURONTIN Take 800 mg by mouth 3 (three) times daily.   hydrochlorothiazide 25 MG tablet Commonly known as:  HYDRODIURIL Take 25 mg by mouth daily.   HYDROcodone-acetaminophen 7.5-325 MG tablet Commonly known as:  NORCO Take 1 tablet by mouth 4 (four) times daily as needed for moderate pain.   LINZESS 290 MCG Caps capsule Generic drug:  linaclotide Take 290 mcg by mouth daily as needed.   methadone 5 MG tablet Commonly known as:  DOLOPHINE Take 5 mg by mouth 2 (two) times daily.   metoprolol tartrate 25 MG tablet Commonly known as:  LOPRESSOR Take 25 mg by mouth 2 (two) times daily.   multivitamin with minerals tablet Take 1 tablet by mouth daily.   ondansetron 8 MG tablet Commonly known as:  ZOFRAN Take 8 mg by mouth 3 (three) times daily as needed for nausea or vomiting.   pantoprazole 40 MG tablet Commonly known as:  PROTONIX Take 40 mg by mouth 2 (two) times daily.   polyvinyl alcohol 1.4 % ophthalmic solution Commonly known as:  LIQUIFILM TEARS Place 1 drop into both eyes every 2 (two) hours as needed for dry eyes.   potassium chloride 20 MEQ packet Commonly known as:  KLOR-CON Take 20  mEq by mouth daily.   predniSONE 10 MG tablet Commonly known as:  DELTASONE Take 10 mg by mouth daily with breakfast.   promethazine 25 MG tablet Commonly known as:  PHENERGAN Take 25 mg by mouth every 6 (six) hours as needed for nausea or vomiting.   rOPINIRole 2 MG tablet Commonly known as:  REQUIP Take 2 mg by mouth at bedtime.   senna-docusate 8.6-50 MG tablet Commonly known as:  Senokot-S Take 2 tablets by mouth 2 (two) times daily.   spironolactone 25 MG tablet Commonly known as:  ALDACTONE Take 50 mg by mouth daily. 2 tablets   traZODone 50 MG tablet Commonly known as:  DESYREL Take 50 mg by mouth at bedtime.       Meds ordered this encounter  Medications  . spironolactone (ALDACTONE) 25 MG tablet    Sig: Take 50 mg by mouth daily. 2 tablets  . clonazePAM (KLONOPIN) 0.5 MG tablet    Sig: Take 0.5 mg by mouth 2 (two) times daily as needed for anxiety.  . hydrochlorothiazide (HYDRODIURIL) 25 MG tablet    Sig: Take 25 mg by mouth daily.  Marland Kitchen HYDROcodone-acetaminophen (NORCO) 7.5-325 MG tablet    Sig: Take 1 tablet by mouth 4 (four) times daily as needed for moderate pain.  . methadone (DOLOPHINE) 5 MG tablet    Sig: Take 5 mg by mouth 2 (two) times daily.  . predniSONE (DELTASONE) 10 MG tablet    Sig: Take 10 mg by mouth daily with breakfast.    Immunization History  Administered Date(s) Administered  . Influenza Split 01/08/2012  . Influenza,inj,Quad PF,36+ Mos 01/01/2013, 11/06/2014, 11/30/2015  . Influenza-Unspecified 11/30/2015  . PPD Test 09/18/2015  . Pneumococcal Polysaccharide-23 02/08/2008, 04/07/2015  . Td 02/07/2006  . Tdap 12/12/2014    Social History  Substance Use Topics  . Smoking status: Never Smoker  . Smokeless tobacco: Never Used     Comment: Never used tobacco  . Alcohol use No    Family history is   Family History  Problem Relation Age of Onset  . Lung cancer Mother   . Diabetes Mother   . Lung cancer Father   . Diabetes  Father   .  Arthritis Other   . Diabetes Other   . Hyperlipidemia Other   . Diabetes Brother       Review of Systems  DATA OBTAINED: from patient GENERAL:  no fevers, fatigue, appetite changes SKIN: No itching, or rash EYES: No eye pain, redness, discharge EARS: No earache, tinnitus, change in hearing NOSE: No congestion, drainage or bleeding  MOUTH/THROAT: No mouth or tooth pain, No sore throat RESPIRATORY: No cough, wheezing, SOB CARDIAC: No chest pain, palpitations, lower extremity edema  GI: No abdominal pain, No N/V/D or constipation, No heartburn or reflux  GU: No dysuria, frequency or urgency, or incontinence  MUSCULOSKELETAL: No unrelieved bone/joint pain NEUROLOGIC: No headache, dizziness or focal weakness PSYCHIATRIC: No c/o anxiety or sadness   Vitals:   05/02/16 0856  BP: 120/69  Pulse: (!) 57  Resp: 18  Temp: 97.3 F (36.3 C)    SpO2 Readings from Last 1 Encounters:  04/14/15 98%   Body mass index is 29.18 kg/m.     Physical Exam  GENERAL APPEARANCE: Alert, conversant,  No acute distress.  SKIN: legs wrapped; no heat HEAD: Normocephalic, atraumatic  EYES: Conjunctiva/lids clear. Pupils round, reactive. EOMs intact.  EARS: External exam WNL, canals clear. Hearing grossly normal.  NOSE: No deformity or discharge.  MOUTH/THROAT: Lips w/o lesions  RESPIRATORY: Breathing is even, unlabored. Lung sounds are clear   CARDIOVASCULAR: Heart RRR no murmurs, rubs or gallops; BLE wrapped   GASTROINTESTINAL: Abdomen is soft, non-tender, not distended w/ normal bowel sounds. GENITOURINARY: Bladder non tender, not distended  MUSCULOSKELETAL: No abnormal joints or musculature NEUROLOGIC:  Cranial nerves 2-12 grossly intact. Moves all extremities  PSYCHIATRIC: Mood and affect appropriate to situation, no behavioral issues  Patient Active Problem List   Diagnosis Date Noted  . Wounds, multiple open, lower extremity 09/24/2015  . Multiple open wounds of right  lower extremity 09/24/2015  . Wound of upper extremity 09/24/2015  . Sepsis ([place]) 09/21/2015  . Abnormal brain MRI   . At risk for falls   . Bacteremia   . Cephalgia   . Chronic migraine w/o aura w/o status migrainosus, not intractable   . Disease of thyroid gland   . Fibromyalgia   . Leukocytosis   . Lumbar degenerative disc disease   . Lumbar radiculopathy   . Lupus erythematosus   . MRSA (methicillin resistant staph aureus) culture positive   . Myoclonus   . Narcotic abuse   . Opioid dependence ([place])   . Peripheral neuropathy ([place])   . Therapeutic drug monitoring   . Fever   . Somnolence 05/25/2014  . Hypotension 05/25/2014  . FUO (fever of unknown origin)   . Anemia 05/24/2014  . Thigh abscess 05/23/2014  . Abscess   . MRSA infection   . Munchausen syndrome   . Malingering   . Mixed personality disorder   . 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 Drug-seeking behavior   . Infection 05/09/2014  . Abscess of left thigh 05/08/2014  . Diabetes mellitus type 2, controlled ([place]) 05/08/2014  . 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 Neuropathy ([place]) 08/28/2013  . Hypertension   . Lower extremity cellulitis 08/22/2013  . Hypokalemia 08/22/2013  . Syncope 01/11/2012  . Hyponatremia 12/17/2011  . SLE-on prednisone-Follow at [facility] 02/05/2011  . DVT L IJ 12/26/10 02/04/2011  . Normocytic anemia 12/27/2010  . Malignant neoplasm of female breast ([place]) 09/07/2009  . Type 2 diabetes mellitus ([place]) 09/07/2009  . HYPERLIPIDEMIA 09/07/2009  .  Iron deficiency anemia 09/07/2009  . Chronic pain syndrome 09/07/2009  . MIGRAINE HEADACHE 09/07/2009  . ALLERGIC RHINITIS 09/07/2009  . GERD 09/07/2009  . SEIZURE DISORDER 09/07/2009  . PERSONALITY DISORDER 09/01/2009  . Major depression, recurrent, chronic ([place]) 09/01/2009  . RENAL INSUFFICIENCY 09/01/2009      Labs reviewed: Basic Metabolic Panel:    Component Value Date/Time   NA 137  04/27/2016 0100   NA 135 04/25/2013 1439   NA 135 (L) 04/08/2013 1148   K 3.7 04/27/2016 0100   K 3.7 12/14/2015 1513   K 3.5 04/25/2013 1439   K 4.6 04/08/2013 1148   CL 94 (L) 12/14/2015 1513   CL 90 (L) 04/25/2013 1439   CL 91 (L) 03/15/2012 1410   CO2 27 12/14/2015 1513   CO2 32 04/25/2013 1439   CO2 28 04/08/2013 1148   GLUCOSE 156 (H) 12/14/2015 1513   GLUCOSE 95 12/26/2014 0649   GLUCOSE 201 (H) 04/25/2013 1439   GLUCOSE 131 (H) 03/15/2012 1410   BUN 13 04/27/2016 0100   BUN 26 (H) 04/25/2013 1439   BUN 29.9 (H) 04/08/2013 1148   CREATININE 1.0 04/27/2016 0100   CREATININE 0.89 12/14/2015 1513   CREATININE 0.94 09/11/2013 1718   CREATININE 1.2 (H) 04/08/2013 1148   CALCIUM 9.1 12/14/2015 1513   CALCIUM 9.9 04/25/2013 1439   CALCIUM 9.6 04/08/2013 1148   PROT 7.3 12/14/2015 1513   PROT 8.5 (H) 04/25/2013 1439   PROT 6.9 04/08/2013 1148   ALBUMIN 4.3 12/14/2015 1513   ALBUMIN 3.4 (L) 04/08/2013 1148   AST 19 04/26/2016 0024   AST 17 12/14/2015 1513   AST 21 04/25/2013 1439   AST 17 04/08/2013 1148   ALT 15 04/26/2016 0024   ALT 10 12/14/2015 1513   ALT 36 04/25/2013 1439   ALT 18 04/08/2013 1148   ALKPHOS 76 04/26/2016 0024   ALKPHOS 112 12/14/2015 1513   ALKPHOS 102 (H) 04/25/2013 1439   ALKPHOS 100 04/08/2013 1148   BILITOT 0.7 12/14/2015 1513   BILITOT 0.70 04/25/2013 1439   BILITOT 0.34 04/08/2013 1148   GFRNONAA 70 12/14/2015 1513   GFRNONAA 67 09/11/2013 1718   GFRAA 81 12/14/2015 1513   GFRAA 77 09/11/2013 1718     Recent Labs  12/14/15 1513 04/26/16 0024 04/26/16 0517 04/27/16 0100  NA 131* 138  --  137  K 3.7 3.6 3.5 3.7  CL 94*  --   --   --   CO2 27  --   --   --   GLUCOSE 156*  --   --   --   BUN 19 14  --  13  CREATININE 0.89 0.9  --  1.0  CALCIUM 9.1  --   --   --    Liver Function Tests:  Recent Labs  09/12/15 12/14/15 1513 04/26/16 0024  AST 27 17 19   ALT 17 10 15   ALKPHOS 153* 112 76  BILITOT  --  0.7  --   PROT  --   7.3  --   ALBUMIN  --  4.3  --    No results for input(s): LIPASE, AMYLASE in the last 8760 hours. No results for input(s): AMMONIA in the last 8760 hours. CBC:  Recent Labs  12/14/15 1513 04/26/16 04/26/16 0517 04/27/16 0100  WBC 9.9 7.9  --  7.9  NEUTROABS 8.6*  --   --   --   HGB 11.8 12.3  --  10.5*  HCT 37.8 38  12* 33*  MCV 85  --   --   --   PLT 213 219  --  217   Lipid No results for input(s): CHOL, HDL, LDLCALC, TRIG in the last 8760 hours.  Cardiac Enzymes: No results for input(s): CKTOTAL, CKMB, CKMBINDEX, TROPONINI in the last 8760 hours. BNP: No results for input(s): BNP in the last 8760 hours. No results found for: [facility] Lab Results  Component Value Date   HGBA1C 6.5 (H) 05/23/2014   Lab Results  Component Value Date   TSH 1.782 11/09/2012   Lab Results  Component Value Date   UUEKCMKL49 179 11/09/2012   Lab Results  Component Value Date   FOLATE 13.5 09/27/2011   Lab Results  Component Value Date   IRON 20 (L) 05/07/2014   TIBC 321 05/07/2014   FERRITIN 336 (H) 05/07/2014    Imaging and Procedures obtained prior to SNF admission: No results found.   Not all labs, radiology exams or other studies done during hospitalization come through on my EPIC note; however they are reviewed by me.    Assessment and Plan  CELLULITIS BLE - treated with IV clindamycin and leg wraps. A PICC line placed due to poor access and ID rec dalbavacin 1500 milligrams IV to be given once prior to discharge. Leg wounds were healing on discharge SNF - admitted for wound care and OT/Pt ; abx course is finished  HYPOKALEMIA- repleted SNF - will f/u BMP  CHRONIC NECK PAIN/CHRONIC PAIN /OPIOID ADDICTION SNF - cont with home/pain clinic regimen of methadone 5 mg BID and norco 5 mg q 4 which was scheduled by me because she asked for it when it was prn 100% of the time  ADDISONS'S DX SNF - cont prednisone 10 mg daily  HTN Stable- cont HCTZ 25 mg daily, lasix 20  mg daily and metoprolol25 mg BID  POLYNEUROPATHY SNF - stable; cont neurontin 800 mg TID  DEPRESSION SNF - -not stated as uncontrolled; cont cymbalta 60 mg BID   Time spent > 45 min;> 50% of time with patient was spent reviewing records, labs, tests and studies, counseling and developing plan of care  Webb Silversmith D. Sheppard Coil, MD

## 2016-05-05 LAB — CBC AND DIFFERENTIAL
HCT: 39 % (ref 36–46)
Hemoglobin: 12.3 g/dL (ref 12.0–16.0)
PLATELETS: 210 10*3/uL (ref 150–399)
WBC: 7.5 10^3/mL

## 2016-05-05 LAB — BASIC METABOLIC PANEL
BUN: 18 mg/dL (ref 4–21)
CREATININE: 1 mg/dL (ref 0.5–1.1)
Glucose: 116 mg/dL
Potassium: 3.8 mmol/L (ref 3.4–5.3)
SODIUM: 141 mmol/L (ref 137–147)

## 2016-05-06 ENCOUNTER — Encounter: Payer: Self-pay | Admitting: Internal Medicine

## 2016-05-06 DIAGNOSIS — S81802D Unspecified open wound, left lower leg, subsequent encounter: Secondary | ICD-10-CM | POA: Insufficient documentation

## 2016-05-06 DIAGNOSIS — L03116 Cellulitis of left lower limb: Secondary | ICD-10-CM | POA: Insufficient documentation

## 2016-05-06 DIAGNOSIS — E271 Primary adrenocortical insufficiency: Secondary | ICD-10-CM | POA: Insufficient documentation

## 2016-06-17 ENCOUNTER — Telehealth: Payer: Self-pay | Admitting: *Deleted

## 2016-06-17 ENCOUNTER — Ambulatory Visit: Payer: Medicare Other | Admitting: Hematology & Oncology

## 2016-06-17 ENCOUNTER — Ambulatory Visit (HOSPITAL_BASED_OUTPATIENT_CLINIC_OR_DEPARTMENT_OTHER): Admission: RE | Admit: 2016-06-17 | Payer: Medicare Other | Source: Ambulatory Visit

## 2016-06-17 ENCOUNTER — Other Ambulatory Visit: Payer: Medicare Other

## 2016-06-17 NOTE — Telephone Encounter (Signed)
Pt called to Triage at 830 am per onset of fever of 102 and chest congestion.  She states she is scheduled for CT and visit today with Dr Marin Olp and will need to reschedule.  Per phone discussion of above symptoms- pt is under the care of her primary MD with [facility] [place] per her known lupus with recent inpt stay secondary to cellulitis.  She is on an antibiotic and has home health nursing.  Informed pt above requested need to reschedule will be sent to Dr Antonieta Pert office.  Pt will follow up with primary MD per fever in setting of known lupus with an active infection.  This RN contacted RN with Dr Marin Olp per above.

## 2016-07-27 ENCOUNTER — Ambulatory Visit (HOSPITAL_BASED_OUTPATIENT_CLINIC_OR_DEPARTMENT_OTHER): Payer: Medicare Other | Admitting: Hematology & Oncology

## 2016-07-27 ENCOUNTER — Ambulatory Visit (HOSPITAL_BASED_OUTPATIENT_CLINIC_OR_DEPARTMENT_OTHER)
Admission: RE | Admit: 2016-07-27 | Discharge: 2016-07-27 | Disposition: A | Discharge status: Home / Self Care | Payer: Medicare Other | Source: Ambulatory Visit | Attending: Hematology & Oncology | Admitting: Hematology & Oncology

## 2016-07-27 ENCOUNTER — Other Ambulatory Visit (HOSPITAL_BASED_OUTPATIENT_CLINIC_OR_DEPARTMENT_OTHER): Payer: Medicare Other

## 2016-07-27 VITALS — BP 125/58 | HR 56 | Temp 97.7°F | Resp 18 | Wt 188.0 lb

## 2016-07-27 DIAGNOSIS — L0390 Cellulitis, unspecified: Secondary | ICD-10-CM | POA: Diagnosis not present

## 2016-07-27 DIAGNOSIS — Z853 Personal history of malignant neoplasm of breast: Secondary | ICD-10-CM | POA: Diagnosis not present

## 2016-07-27 DIAGNOSIS — R911 Solitary pulmonary nodule: Secondary | ICD-10-CM

## 2016-07-27 DIAGNOSIS — I700 Atherosclerosis of aorta: Secondary | ICD-10-CM | POA: Insufficient documentation

## 2016-07-27 DIAGNOSIS — C50011 Malignant neoplasm of nipple and areola, right female breast: Secondary | ICD-10-CM

## 2016-07-27 DIAGNOSIS — I2510 Atherosclerotic heart disease of native coronary artery without angina pectoris: Secondary | ICD-10-CM | POA: Insufficient documentation

## 2016-07-27 DIAGNOSIS — G4701 Insomnia due to medical condition: Secondary | ICD-10-CM

## 2016-07-27 DIAGNOSIS — B372 Candidiasis of skin and nail: Secondary | ICD-10-CM

## 2016-07-27 LAB — CMP (CANCER CENTER ONLY)
ALBUMIN: 3.6 g/dL (ref 3.3–5.5)
ALK PHOS: 62 U/L (ref 26–84)
ALT(SGPT): 23 U/L (ref 10–47)
AST: 23 U/L (ref 11–38)
BILIRUBIN TOTAL: 0.7 mg/dL (ref 0.20–1.60)
BUN, Bld: 13 mg/dL (ref 7–22)
CALCIUM: 9 mg/dL (ref 8.0–10.3)
CO2: 27 mEq/L (ref 18–33)
CREATININE: 1 mg/dL (ref 0.6–1.2)
Chloride: 102 mEq/L (ref 98–108)
Glucose, Bld: 128 mg/dL — ABNORMAL HIGH (ref 73–118)
Potassium: 4.4 mEq/L (ref 3.3–4.7)
SODIUM: 138 meq/L (ref 128–145)
TOTAL PROTEIN: 6.5 g/dL (ref 6.4–8.1)

## 2016-07-27 LAB — CBC WITH DIFFERENTIAL (CANCER CENTER ONLY)
BASO#: 0 10*3/uL (ref 0.0–0.2)
BASO%: 0.4 % (ref 0.0–2.0)
EOS ABS: 0.1 10*3/uL (ref 0.0–0.5)
EOS%: 1.2 % (ref 0.0–7.0)
HCT: 36.8 % (ref 34.8–46.6)
HEMOGLOBIN: 11.7 g/dL (ref 11.6–15.9)
LYMPH#: 0.7 10*3/uL — AB (ref 0.9–3.3)
LYMPH%: 8.7 % — ABNORMAL LOW (ref 14.0–48.0)
MCH: 29 pg (ref 26.0–34.0)
MCHC: 31.8 g/dL — AB (ref 32.0–36.0)
MCV: 91 fL (ref 81–101)
MONO#: 0.5 10*3/uL (ref 0.1–0.9)
MONO%: 6.7 % (ref 0.0–13.0)
NEUT%: 83 % — ABNORMAL HIGH (ref 39.6–80.0)
NEUTROS ABS: 6.5 10*3/uL (ref 1.5–6.5)
Platelets: 145 10*3/uL (ref 145–400)
RBC: 4.03 10*6/uL (ref 3.70–5.32)
RDW: 16.3 % — ABNORMAL HIGH (ref 11.1–15.7)
WBC: 7.8 10*3/uL (ref 3.9–10.0)

## 2016-07-27 MED ORDER — FLUCONAZOLE 100 MG PO TABS: 100.0000 mg | ORAL_TABLET | Freq: Every day | ORAL | 3 refills | Status: DC

## 2016-07-27 MED ORDER — TRAZODONE HCL 50 MG PO TABS: 50.0000 mg | ORAL_TABLET | Freq: Every evening | ORAL | 3 refills | Status: DC | PRN

## 2016-07-27 NOTE — Progress Notes (Signed)
Hematology and Oncology Follow Up Visit  [name] Carr [id number] [birth year] 61 y.o. 07/27/2016   Principle Diagnosis:   Metastatic breast cancer-remission  Right lower lobe pulmonary nodule  Current Therapy:    Observation     Interim History:  [name] Carr is back for follow-up. She has had issues since we last saw her. Unfortunately, she has multiple other health problems.  She's been hospitalized. They should been hospitalized at [facility].  We did go ahead and do a CT scan of her chest. This was to follow-up on a pulmonary nodule that was seen in the right lower lobe. Thankfully, the pulmonary nodule was minimally larger. It measured 10 x 9 mm. This was slightly larger than 2 years ago when it was 7 mm.  Her problem right now that she has cellulitis on her legs. She's had this before. She has issues with edema. I think the edema is probably from steroids that she takes for her lupus.  She cannot take oral antibiotics she says. She says that when she gets IV antibiotics the cellulitis gets better.  I think that she probably has to go to the emergency room and have this looked at.  She also has candidiasis in skin folds on her abdomen.  Her appetite is doing okay. She's had no nausea or vomiting. She has no diarrhea.  Overall, her performance status is ECOG 2.   Medications:  Current Outpatient Prescriptions:  .  clonazePAM (KLONOPIN) 0.5 MG tablet, Take 0.5 mg by mouth 2 (two) times daily. , Disp: , Rfl:  .  DULoxetine (CYMBALTA) 60 MG capsule, Take 1 capsule (60 mg total) by mouth 2 (two) times daily., Disp: 180 capsule, Rfl: 1 .  furosemide (LASIX) 20 MG tablet, Take 20 mg by mouth daily., Disp: , Rfl:  .  gabapentin (NEURONTIN) 800 MG tablet, Take 800 mg by mouth 3 (three) times daily., Disp: , Rfl:  .  hydrochlorothiazide (HYDRODIURIL) 25 MG tablet, Take 25 mg by mouth daily., Disp: , Rfl:  .  HYDROcodone-acetaminophen (NORCO/VICODIN) 5-325 MG tablet, Take  1 tablet by mouth every 4 (four) hours., Disp: , Rfl:  .  linaclotide (LINZESS) 290 MCG CAPS capsule, Take 290 mcg by mouth daily as needed. , Disp: , Rfl:  .  methadone (DOLOPHINE) 5 MG tablet, Take 5 mg by mouth 2 (two) times daily., Disp: , Rfl:  .  metoprolol tartrate (LOPRESSOR) 25 MG tablet, Take 25 mg by mouth 2 (two) times daily., Disp: , Rfl:  .  Multiple Vitamins-Minerals (MULTIVITAMIN WITH MINERALS) tablet, Take 1 tablet by mouth daily., Disp: , Rfl:  .  ondansetron (ZOFRAN) 8 MG tablet, Take 8 mg by mouth 3 (three) times daily as needed for nausea or vomiting. , Disp: , Rfl:  .  pantoprazole (PROTONIX) 40 MG tablet, Take 40 mg by mouth 2 (two) times daily., Disp: , Rfl:  .  polyvinyl alcohol (LIQUIFILM TEARS) 1.4 % ophthalmic solution, Place 1 drop into both eyes every 2 (two) hours as needed for dry eyes. , Disp: , Rfl:  .  potassium chloride (KLOR-CON) 20 MEQ packet, Take 20 mEq by mouth daily., Disp: , Rfl:  .  predniSONE (DELTASONE) 10 MG tablet, Take 10 mg by mouth daily with breakfast., Disp: , Rfl:  .  promethazine (PHENERGAN) 25 MG tablet, Take 25 mg by mouth every 6 (six) hours as needed for nausea or vomiting., Disp: , Rfl:  .  rOPINIRole (REQUIP) 2 MG tablet, Take 2 mg by  mouth at bedtime., Disp: , Rfl:  .  senna-docusate (SENOKOT-S) 8.6-50 MG per tablet, Take 2 tablets by mouth 2 (two) times daily. , Disp: , Rfl:  .  spironolactone (ALDACTONE) 25 MG tablet, Take 50 mg by mouth daily. 2 tablets, Disp: , Rfl:  .  traZODone (DESYREL) 50 MG tablet, Take 50 mg by mouth at bedtime as needed. , Disp: , Rfl:   Allergies:  Allergies  Allergen Reactions  . Ammonia Other (See Comments)    Other reaction(s): ASTHMA Ended up on ventilator from ammonia tabs  . Contrast Media [Iodinated Diagnostic Agents] Other (See Comments)    Other reaction(s): DIFFICULTY BREATHING Doesn't breath well.  . Iohexol Other (See Comments)    SOB   . Methotrexate Anaphylaxis  . Midazolam Hcl  Anaphylaxis  . Nalbuphine Other (See Comments)    Can't breathe well  . Bee Venom   . Hydroxychloroquine   . Infliximab Nausea And Vomiting  . Ketorolac Tromethamine Other (See Comments)    Injectable doesn't work and pill hurts stomach.  . Morphine And Related Hives  . Nsaids Nausea And Vomiting  . Penicillins   . Pentazocine   . Rofecoxib   . Tetracyclines & Related     Other reaction(s): ANAPHYLAXIS  . Celecoxib Rash  . Midazolam Anxiety    Other reaction(s): CAUSES ANXIETY    Past Medical History, Surgical history, Social history, and Family History were reviewed and updated.  Review of Systems: As above  Physical Exam:  weight is 188 lb (85.3 kg). Her oral temperature is 97.7 F (36.5 C). Her blood pressure is 125/58 (abnormal) and her pulse is 56 (abnormal). Her respiration is 18 and oxygen saturation is 97%.   Wt Readings from Last 3 Encounters:  07/27/16 188 lb (85.3 kg)  05/02/16 170 lb (77.1 kg)  12/14/15 165 lb (74.8 kg)     Well-developed and well-nourished white female in no obvious distress. Head and neck exam shows no ocular or oral lesions. There are no palpable cervical or supraclavicular lymph nodes. Lungs are clear. Cardiac exam regular rate and rhythm with no murmurs, rubs or bruits. Abdomen is soft. She has good bowel sounds present no palpable abdominal mass. There is no palpable fluid wave. Chest wall exam showed bilateral mastectomies. No nodules, no erythema or warmth is noted. No discharge is noted. Extremities shows the area of cellulitis in the lower legs bilaterally. She has more cellulitis in the right leg than on the left leg. She has 1-2+ edema in her legs. She has several open wounds on her lower legs. Neurological exam is nonfocal.  Lab Results  Component Value Date   WBC 7.8 07/27/2016   HGB 11.7 07/27/2016   HCT 36.8 07/27/2016   MCV 91 07/27/2016   PLT 145 07/27/2016     Chemistry      Component Value Date/Time   NA 138 07/27/2016  1427   NA 135 (L) 04/08/2013 1148   K 4.4 07/27/2016 1427   K 4.6 04/08/2013 1148   CL 102 07/27/2016 1427   CL 91 (L) 03/15/2012 1410   CO2 27 07/27/2016 1427   CO2 28 04/08/2013 1148   BUN 13 07/27/2016 1427   BUN 29.9 (H) 04/08/2013 1148   CREATININE 1.0 07/27/2016 1427   CREATININE 1.2 (H) 04/08/2013 1148   GLU 116 05/05/2016      Component Value Date/Time   CALCIUM 9.0 07/27/2016 1427   CALCIUM 9.6 04/08/2013 1148   ALKPHOS 62 07/27/2016 1427  ALKPHOS 100 04/08/2013 1148   AST 23 07/27/2016 1427   AST 17 04/08/2013 1148   ALT 23 07/27/2016 1427   ALT 18 04/08/2013 1148   BILITOT 0.70 07/27/2016 1427   BILITOT 0.34 04/08/2013 1148         Impression and Plan: Ms. Penafiel is 61 year old female. She has metastatic breast cancer.   We will still follow this right lower lobe nodule. I will do another CT scan in 4 months.  My main concern right now is the cellulitis. I told her that this has to be treated with intravenous antibiotic if she cannot take oral antibiotic. Maybe, she can have a PICC line put in at the hospital and then take antibiotics at home.  For right now, we will get her back in 4 months. I think this will be reasonable from my point of view. Again she has quite a few other issues.  I did spend about 35 minutes with her.   Volanda Napoleon, MD 6/20/20183:38 PM

## 2016-07-28 LAB — CANCER ANTIGEN 27.29: CA 27.29: 26.3 U/mL (ref 0.0–38.6)

## 2016-10-18 ENCOUNTER — Other Ambulatory Visit: Payer: Self-pay | Admitting: Internal Medicine

## 2016-10-29 ENCOUNTER — Other Ambulatory Visit: Payer: Self-pay | Admitting: Internal Medicine

## 2016-11-23 ENCOUNTER — Other Ambulatory Visit: Payer: Self-pay | Admitting: Hematology & Oncology

## 2016-11-23 DIAGNOSIS — G4701 Insomnia due to medical condition: Secondary | ICD-10-CM

## 2016-11-23 DIAGNOSIS — R911 Solitary pulmonary nodule: Secondary | ICD-10-CM

## 2016-11-23 DIAGNOSIS — B372 Candidiasis of skin and nail: Secondary | ICD-10-CM

## 2016-11-25 ENCOUNTER — Other Ambulatory Visit: Payer: Self-pay

## 2016-11-25 ENCOUNTER — Ambulatory Visit: Payer: Self-pay | Admitting: Hematology & Oncology

## 2016-11-25 ENCOUNTER — Ambulatory Visit (HOSPITAL_BASED_OUTPATIENT_CLINIC_OR_DEPARTMENT_OTHER): Payer: Medicare Other

## 2016-12-15 ENCOUNTER — Other Ambulatory Visit: Payer: Self-pay

## 2016-12-15 ENCOUNTER — Ambulatory Visit: Payer: Self-pay | Admitting: Hematology & Oncology

## 2016-12-15 ENCOUNTER — Ambulatory Visit (HOSPITAL_BASED_OUTPATIENT_CLINIC_OR_DEPARTMENT_OTHER): Payer: Medicare Other

## 2016-12-22 ENCOUNTER — Other Ambulatory Visit (HOSPITAL_BASED_OUTPATIENT_CLINIC_OR_DEPARTMENT_OTHER): Payer: Self-pay

## 2016-12-22 ENCOUNTER — Ambulatory Visit: Payer: Self-pay | Admitting: Family

## 2016-12-22 ENCOUNTER — Other Ambulatory Visit: Payer: Self-pay

## 2016-12-24 ENCOUNTER — Other Ambulatory Visit: Payer: Self-pay | Admitting: Hematology & Oncology

## 2016-12-24 DIAGNOSIS — R911 Solitary pulmonary nodule: Secondary | ICD-10-CM

## 2016-12-24 DIAGNOSIS — G4701 Insomnia due to medical condition: Secondary | ICD-10-CM

## 2016-12-24 DIAGNOSIS — B372 Candidiasis of skin and nail: Secondary | ICD-10-CM

## 2017-01-06 ENCOUNTER — Other Ambulatory Visit (HOSPITAL_BASED_OUTPATIENT_CLINIC_OR_DEPARTMENT_OTHER): Payer: Self-pay

## 2017-01-06 ENCOUNTER — Ambulatory Visit: Payer: Self-pay | Admitting: Hematology & Oncology

## 2017-01-06 ENCOUNTER — Other Ambulatory Visit: Payer: Self-pay

## 2017-01-13 ENCOUNTER — Ambulatory Visit (HOSPITAL_BASED_OUTPATIENT_CLINIC_OR_DEPARTMENT_OTHER): Payer: Medicare Other

## 2017-01-13 ENCOUNTER — Ambulatory Visit: Payer: Self-pay | Admitting: Hematology & Oncology

## 2017-01-13 ENCOUNTER — Other Ambulatory Visit: Payer: Self-pay

## 2017-01-25 ENCOUNTER — Other Ambulatory Visit: Payer: Self-pay | Admitting: Hematology & Oncology

## 2017-01-25 DIAGNOSIS — B372 Candidiasis of skin and nail: Secondary | ICD-10-CM

## 2017-01-25 DIAGNOSIS — R911 Solitary pulmonary nodule: Secondary | ICD-10-CM

## 2017-01-25 DIAGNOSIS — G4701 Insomnia due to medical condition: Secondary | ICD-10-CM

## 2017-02-06 ENCOUNTER — Other Ambulatory Visit: Payer: Self-pay | Admitting: Internal Medicine

## 2017-02-20 ENCOUNTER — Ambulatory Visit (HOSPITAL_BASED_OUTPATIENT_CLINIC_OR_DEPARTMENT_OTHER): Payer: Medicare Other

## 2017-02-20 ENCOUNTER — Other Ambulatory Visit: Payer: Self-pay

## 2017-02-20 ENCOUNTER — Ambulatory Visit: Payer: Self-pay | Admitting: Hematology & Oncology

## 2017-02-24 ENCOUNTER — Other Ambulatory Visit: Payer: Self-pay | Admitting: Hematology & Oncology

## 2017-02-24 DIAGNOSIS — G4701 Insomnia due to medical condition: Secondary | ICD-10-CM

## 2017-02-24 DIAGNOSIS — B372 Candidiasis of skin and nail: Secondary | ICD-10-CM

## 2017-02-24 DIAGNOSIS — R911 Solitary pulmonary nodule: Secondary | ICD-10-CM

## 2017-03-23 ENCOUNTER — Inpatient Hospital Stay: Payer: Medicare Other

## 2017-03-23 ENCOUNTER — Inpatient Hospital Stay: Payer: Medicare Other | Admitting: Hematology & Oncology

## 2017-03-23 ENCOUNTER — Ambulatory Visit (HOSPITAL_BASED_OUTPATIENT_CLINIC_OR_DEPARTMENT_OTHER): Payer: Medicare Other

## 2017-04-04 ENCOUNTER — Other Ambulatory Visit: Payer: Self-pay | Admitting: Hematology & Oncology

## 2017-04-04 DIAGNOSIS — B372 Candidiasis of skin and nail: Secondary | ICD-10-CM

## 2017-04-04 DIAGNOSIS — R911 Solitary pulmonary nodule: Secondary | ICD-10-CM

## 2017-04-04 DIAGNOSIS — G4701 Insomnia due to medical condition: Secondary | ICD-10-CM

## 2017-05-02 ENCOUNTER — Other Ambulatory Visit: Payer: Self-pay | Admitting: Hematology & Oncology

## 2017-05-02 DIAGNOSIS — B372 Candidiasis of skin and nail: Secondary | ICD-10-CM

## 2017-05-02 DIAGNOSIS — R911 Solitary pulmonary nodule: Secondary | ICD-10-CM

## 2017-05-02 DIAGNOSIS — G4701 Insomnia due to medical condition: Secondary | ICD-10-CM

## 2017-05-25 ENCOUNTER — Other Ambulatory Visit: Payer: Self-pay | Admitting: Hematology & Oncology

## 2017-05-25 DIAGNOSIS — R911 Solitary pulmonary nodule: Secondary | ICD-10-CM

## 2017-05-25 DIAGNOSIS — B372 Candidiasis of skin and nail: Secondary | ICD-10-CM

## 2017-05-25 DIAGNOSIS — G4701 Insomnia due to medical condition: Secondary | ICD-10-CM

## 2017-06-12 ENCOUNTER — Other Ambulatory Visit: Payer: Medicare Other

## 2017-06-12 ENCOUNTER — Ambulatory Visit: Payer: Medicare Other | Admitting: Hematology & Oncology

## 2017-06-24 ENCOUNTER — Other Ambulatory Visit: Payer: Self-pay | Admitting: Hematology & Oncology

## 2017-06-24 DIAGNOSIS — G4701 Insomnia due to medical condition: Secondary | ICD-10-CM

## 2017-06-24 DIAGNOSIS — R911 Solitary pulmonary nodule: Secondary | ICD-10-CM

## 2017-06-24 DIAGNOSIS — B372 Candidiasis of skin and nail: Secondary | ICD-10-CM

## 2017-06-26 ENCOUNTER — Other Ambulatory Visit: Payer: Self-pay | Admitting: Family

## 2017-06-26 DIAGNOSIS — B372 Candidiasis of skin and nail: Secondary | ICD-10-CM

## 2017-06-26 DIAGNOSIS — R911 Solitary pulmonary nodule: Secondary | ICD-10-CM

## 2017-06-26 DIAGNOSIS — G4701 Insomnia due to medical condition: Secondary | ICD-10-CM

## 2017-07-06 ENCOUNTER — Inpatient Hospital Stay: Payer: Medicare Other | Admitting: Hematology & Oncology

## 2017-07-06 ENCOUNTER — Inpatient Hospital Stay: Payer: Medicare Other

## 2017-08-01 ENCOUNTER — Telehealth: Payer: Self-pay | Admitting: Hematology & Oncology

## 2017-08-01 NOTE — Telephone Encounter (Signed)
lmom to inform pt of r/s lab/MD appts 7/17 at 215 pm

## 2017-08-22 ENCOUNTER — Other Ambulatory Visit: Payer: Self-pay | Admitting: *Deleted

## 2017-08-22 DIAGNOSIS — C50011 Malignant neoplasm of nipple and areola, right female breast: Secondary | ICD-10-CM

## 2017-08-23 ENCOUNTER — Ambulatory Visit (HOSPITAL_BASED_OUTPATIENT_CLINIC_OR_DEPARTMENT_OTHER)
Admission: RE | Admit: 2017-08-23 | Discharge: 2017-08-23 | Disposition: A | Discharge status: Home / Self Care | Payer: Medicare Other | Source: Ambulatory Visit | Attending: Hematology & Oncology | Admitting: Hematology & Oncology

## 2017-08-23 ENCOUNTER — Other Ambulatory Visit: Payer: Self-pay

## 2017-08-23 ENCOUNTER — Encounter: Payer: Self-pay | Admitting: Hematology & Oncology

## 2017-08-23 ENCOUNTER — Inpatient Hospital Stay: Payer: Medicare Other

## 2017-08-23 ENCOUNTER — Inpatient Hospital Stay: Payer: Medicare Other | Attending: Hematology & Oncology | Admitting: Hematology & Oncology

## 2017-08-23 DIAGNOSIS — R911 Solitary pulmonary nodule: Secondary | ICD-10-CM | POA: Insufficient documentation

## 2017-08-23 DIAGNOSIS — I2510 Atherosclerotic heart disease of native coronary artery without angina pectoris: Secondary | ICD-10-CM | POA: Insufficient documentation

## 2017-08-23 DIAGNOSIS — I700 Atherosclerosis of aorta: Secondary | ICD-10-CM | POA: Insufficient documentation

## 2017-08-23 DIAGNOSIS — Z853 Personal history of malignant neoplasm of breast: Secondary | ICD-10-CM | POA: Diagnosis not present

## 2017-08-23 DIAGNOSIS — C50011 Malignant neoplasm of nipple and areola, right female breast: Secondary | ICD-10-CM

## 2017-08-23 LAB — CMP (CANCER CENTER ONLY)
ALK PHOS: 82 U/L (ref 26–84)
ALT: 28 U/L (ref 10–47)
ANION GAP: 8 (ref 5–15)
AST: 29 U/L (ref 11–38)
Albumin: 3.8 g/dL (ref 3.5–5.0)
BUN: 17 mg/dL (ref 7–22)
CHLORIDE: 94 mmol/L — AB (ref 98–108)
CO2: 33 mmol/L (ref 18–33)
Calcium: 9.5 mg/dL (ref 8.0–10.3)
Creatinine: 1 mg/dL (ref 0.60–1.20)
Glucose, Bld: 221 mg/dL — ABNORMAL HIGH (ref 73–118)
POTASSIUM: 4 mmol/L (ref 3.3–4.7)
Sodium: 135 mmol/L (ref 128–145)
TOTAL PROTEIN: 7.1 g/dL (ref 6.4–8.1)
Total Bilirubin: 0.7 mg/dL (ref 0.2–1.6)

## 2017-08-23 LAB — CBC WITH DIFFERENTIAL (CANCER CENTER ONLY)
BASOS ABS: 0 10*3/uL (ref 0.0–0.1)
BASOS PCT: 0 %
EOS ABS: 0 10*3/uL (ref 0.0–0.5)
EOS PCT: 0 %
HEMATOCRIT: 37.5 % (ref 34.8–46.6)
Hemoglobin: 11.8 g/dL (ref 11.6–15.9)
Lymphocytes Relative: 7 %
Lymphs Abs: 0.7 10*3/uL — ABNORMAL LOW (ref 0.9–3.3)
MCH: 29.4 pg (ref 26.0–34.0)
MCHC: 31.5 g/dL — AB (ref 32.0–36.0)
MCV: 93.5 fL (ref 81.0–101.0)
MONO ABS: 0.5 10*3/uL (ref 0.1–0.9)
MONOS PCT: 5 %
Neutro Abs: 8.9 10*3/uL — ABNORMAL HIGH (ref 1.5–6.5)
Neutrophils Relative %: 88 %
PLATELETS: 265 10*3/uL (ref 145–400)
RBC: 4.01 MIL/uL (ref 3.70–5.32)
RDW: 14.4 % (ref 11.1–15.7)
WBC Count: 10.1 10*3/uL — ABNORMAL HIGH (ref 3.9–10.0)

## 2017-08-23 NOTE — Progress Notes (Signed)
Hematology and Oncology Follow Up Visit  [name] Carr [id number] Jun 22, 1955 62 y.o. 08/23/2017   Principle Diagnosis:   Metastatic breast cancer-remission  Right lower lobe pulmonary nodule  Current Therapy:    Observation     Interim History:  [name] Carr is back for follow-up.  It has been a year since we last saw her.  She just cannot make it to the office because of other health problems.  She has a cellulitis in her legs.  Both legs are wrapped.  I guess a health nurse comes out to her house to help with this.  She has been hospitalized.  I think she is been at [facility].  We have to get another CT scan of her chest.  Her last scan was done a year ago.  This showed a nodule in the right lower lung measuring 10 x 9 mm.  We had to follow-up with this.  She has had no bleeding.  There is been no cough.  She is had no problems with bowels or bladder.  Of note, her CA 27.29 back in June 2018 was only 26.  Overall, her performance status is ECOG 2.   Medications:  Current Outpatient Medications:  .  clonazePAM (KLONOPIN) 0.5 MG tablet, Take 0.5 mg by mouth 2 (two) times daily. , Disp: , Rfl:  .  DULoxetine (CYMBALTA) 60 MG capsule, Take 1 capsule (60 mg total) by mouth 2 (two) times daily., Disp: 180 capsule, Rfl: 1 .  furosemide (LASIX) 20 MG tablet, Take 20 mg by mouth daily., Disp: , Rfl:  .  gabapentin (NEURONTIN) 800 MG tablet, Take 800 mg by mouth 3 (three) times daily., Disp: , Rfl:  .  hydrochlorothiazide (HYDRODIURIL) 25 MG tablet, Take 25 mg by mouth daily., Disp: , Rfl:  .  HYDROcodone-acetaminophen (NORCO/VICODIN) 5-325 MG tablet, Take 1 tablet by mouth every 4 (four) hours., Disp: , Rfl:  .  methadone (DOLOPHINE) 5 MG tablet, Take 5 mg by mouth 2 (two) times daily., Disp: , Rfl:  .  metoprolol tartrate (LOPRESSOR) 25 MG tablet, Take 25 mg by mouth 2 (two) times daily., Disp: , Rfl:  .  nystatin (MYCOSTATIN/NYSTOP) powder, Apply twice daily to affected  area, Disp: , Rfl:  .  ondansetron (ZOFRAN) 8 MG tablet, Take 8 mg by mouth 3 (three) times daily as needed for nausea or vomiting. , Disp: , Rfl:  .  pantoprazole (PROTONIX) 40 MG tablet, Take 40 mg by mouth 2 (two) times daily., Disp: , Rfl:  .  polyvinyl alcohol (LIQUIFILM TEARS) 1.4 % ophthalmic solution, Place 1 drop into both eyes every 2 (two) hours as needed for dry eyes. , Disp: , Rfl:  .  predniSONE (DELTASONE) 10 MG tablet, Take 10 mg by mouth daily with breakfast., Disp: , Rfl:  .  promethazine (PHENERGAN) 25 MG tablet, Take 25 mg by mouth every 6 (six) hours as needed for nausea or vomiting., Disp: , Rfl:  .  rOPINIRole (REQUIP) 2 MG tablet, Take 2 mg by mouth at bedtime., Disp: , Rfl:  .  senna-docusate (SENOKOT-S) 8.6-50 MG per tablet, Take 2 tablets by mouth 2 (two) times daily. , Disp: , Rfl:  .  spironolactone (ALDACTONE) 25 MG tablet, Take 50 mg by mouth daily. 2 tablets, Disp: , Rfl:  .  traZODone (DESYREL) 50 MG tablet, TAKE 1 TABLET(50 MG) BY MOUTH AT BEDTIME AS NEEDED FOR SLEEP, Disp: 90 tablet, Rfl: 0 .  fluconazole (DIFLUCAN) 100 MG tablet, Take  1 tablet (100 mg total) by mouth daily. (Patient not taking: Reported on 08/23/2017), Disp: 30 tablet, Rfl: 3 .  linaclotide (LINZESS) 290 MCG CAPS capsule, Take 290 mcg by mouth daily as needed. , Disp: , Rfl:  .  Multiple Vitamins-Minerals (MULTIVITAMIN WITH MINERALS) tablet, Take 1 tablet by mouth daily., Disp: , Rfl:  .  potassium chloride (KLOR-CON) 20 MEQ packet, Take 20 mEq by mouth daily., Disp: , Rfl:   Allergies:  Allergies  Allergen Reactions  . Ammonia Other (See Comments)    Other reaction(s): ASTHMA Ended up on ventilator from ammonia tabs  . Contrast Media [Iodinated Diagnostic Agents] Other (See Comments)    Other reaction(s): DIFFICULTY BREATHING Doesn't breath well.  . Iohexol Other (See Comments)    SOB   . Methotrexate Anaphylaxis  . Midazolam Hcl Anaphylaxis  . Nalbuphine Other (See Comments)    Can't  breathe well  . Bee Venom   . Hydroxychloroquine   . Infliximab Nausea And Vomiting  . Ketorolac Tromethamine Other (See Comments)    Injectable doesn't work and pill hurts stomach.  . Morphine And Related Hives  . Nsaids Nausea And Vomiting  . Penicillins   . Pentazocine   . Rofecoxib   . Tetracyclines & Related     Other reaction(s): ANAPHYLAXIS  . Celecoxib Rash  . Midazolam Anxiety    Other reaction(s): CAUSES ANXIETY    Past Medical History, Surgical history, Social history, and Family History were reviewed and updated.  Review of Systems: Review of Systems  Constitutional: Positive for malaise/fatigue.  HENT: Negative.   Eyes: Negative.   Respiratory: Negative.   Cardiovascular: Negative.   Gastrointestinal: Negative.   Genitourinary: Negative.   Musculoskeletal: Negative.   Skin: Negative.   Neurological: Negative.   Endo/Heme/Allergies: Negative.   Psychiatric/Behavioral: Negative.      Physical Exam:  weight is 183 lb 1.9 oz (83.1 kg). Her oral temperature is 98.1 F (36.7 C). Her blood pressure is 140/55 (abnormal) and her pulse is 68. Her respiration is 20 and oxygen saturation is 97%.   Wt Readings from Last 3 Encounters:  08/23/17 183 lb 1.9 oz (83.1 kg)  07/27/16 188 lb (85.3 kg)  05/02/16 170 lb (77.1 kg)     Physical Exam  Constitutional: She is oriented to person, place, and time.  HENT:  Head: Normocephalic and atraumatic.  Mouth/Throat: Oropharynx is clear and moist.  Eyes: Pupils are equal, round, and reactive to light. EOM are normal.  Neck: Normal range of motion.  Cardiovascular: Normal rate, regular rhythm and normal heart sounds.  Pulmonary/Chest: Effort normal and breath sounds normal.  Abdominal: Soft. Bowel sounds are normal.  Musculoskeletal: Normal range of motion. She exhibits no edema, tenderness or deformity.  She has wrappings around both legs.  Lymphadenopathy:    She has no cervical adenopathy.  Neurological: She is  alert and oriented to person, place, and time.  Skin: Skin is warm and dry. No rash noted. No erythema.  Psychiatric: She has a normal mood and affect. Her behavior is normal. Judgment and thought content normal.  Vitals reviewed.    Lab Results  Component Value Date   WBC 10.1 (H) 08/23/2017   HGB 11.8 08/23/2017   HCT 37.5 08/23/2017   MCV 93.5 08/23/2017   PLT 265 08/23/2017     Chemistry      Component Value Date/Time   NA 135 08/23/2017 1356   NA 138 07/27/2016 1427   NA 135 (L) 04/08/2013 1148  K 4.0 08/23/2017 1356   K 4.4 07/27/2016 1427   K 4.6 04/08/2013 1148   CL 94 (L) 08/23/2017 1356   CL 102 07/27/2016 1427   CL 91 (L) 03/15/2012 1410   CO2 33 08/23/2017 1356   CO2 27 07/27/2016 1427   CO2 28 04/08/2013 1148   BUN 17 08/23/2017 1356   BUN 13 07/27/2016 1427   BUN 29.9 (H) 04/08/2013 1148   CREATININE 1.00 08/23/2017 1356   CREATININE 1.0 07/27/2016 1427   CREATININE 1.2 (H) 04/08/2013 1148   GLU 116 05/05/2016      Component Value Date/Time   CALCIUM 9.5 08/23/2017 1356   CALCIUM 9.0 07/27/2016 1427   CALCIUM 9.6 04/08/2013 1148   ALKPHOS 82 08/23/2017 1356   ALKPHOS 62 07/27/2016 1427   ALKPHOS 100 04/08/2013 1148   AST 29 08/23/2017 1356   AST 17 04/08/2013 1148   ALT 28 08/23/2017 1356   ALT 23 07/27/2016 1427   ALT 18 04/08/2013 1148   BILITOT 0.7 08/23/2017 1356   BILITOT 0.34 04/08/2013 1148         Impression and Plan: [name] Carr is 62 year old female. She has metastatic breast cancer.  Again, I do not find any evidence of recurrence.  I am not sure what to make of the lung nodule.  This is some that we are going to have to do a CT scan on.  I will try to get a CT scan today for this.  She does have a strong faith.  She is using this.  She comes in a wheelchair.  Again I am not sure she really is all that mobile.  Thankfully, the CAT scan can be done while she is here today.  I will plan to see her back in another 3-4  months.  Volanda Napoleon, MD 7/17/20193:22 PM

## 2017-08-24 ENCOUNTER — Telehealth: Payer: Self-pay | Admitting: *Deleted

## 2017-08-24 NOTE — Telephone Encounter (Signed)
-----   Message from Volanda Napoleon, MD sent at 08/24/2017 11:23 AM EDT ----- Call - the nodule in the lung is stable in size.  pete

## 2017-08-24 NOTE — Telephone Encounter (Signed)
Patient notified per order of Dr. Marin Olp that the nodule in the lung is stable in size.  Patient appreciative of call and has no questions at this time.

## 2017-09-02 ENCOUNTER — Other Ambulatory Visit: Payer: Self-pay

## 2017-09-02 ENCOUNTER — Encounter (HOSPITAL_COMMUNITY): Payer: Self-pay

## 2017-09-02 ENCOUNTER — Inpatient Hospital Stay (HOSPITAL_COMMUNITY)
Admission: EM | Admit: 2017-09-02 | Discharge: 2017-09-07 | DRG: 603 | Disposition: A | Discharge status: Home / Self Care | Payer: Medicare Other | Attending: Internal Medicine | Admitting: Internal Medicine

## 2017-09-02 ENCOUNTER — Emergency Department (HOSPITAL_COMMUNITY): Payer: Medicare Other

## 2017-09-02 DIAGNOSIS — L97929 Non-pressure chronic ulcer of unspecified part of left lower leg with unspecified severity: Secondary | ICD-10-CM | POA: Diagnosis present

## 2017-09-02 DIAGNOSIS — Z888 Allergy status to other drugs, medicaments and biological substances status: Secondary | ICD-10-CM

## 2017-09-02 DIAGNOSIS — G894 Chronic pain syndrome: Secondary | ICD-10-CM | POA: Diagnosis present

## 2017-09-02 DIAGNOSIS — M797 Fibromyalgia: Secondary | ICD-10-CM | POA: Diagnosis present

## 2017-09-02 DIAGNOSIS — F11282 Opioid dependence with opioid-induced sleep disorder: Secondary | ICD-10-CM | POA: Diagnosis not present

## 2017-09-02 DIAGNOSIS — Z833 Family history of diabetes mellitus: Secondary | ICD-10-CM

## 2017-09-02 DIAGNOSIS — I10 Essential (primary) hypertension: Secondary | ICD-10-CM | POA: Diagnosis present

## 2017-09-02 DIAGNOSIS — E1151 Type 2 diabetes mellitus with diabetic peripheral angiopathy without gangrene: Secondary | ICD-10-CM | POA: Diagnosis present

## 2017-09-02 DIAGNOSIS — B372 Candidiasis of skin and nail: Secondary | ICD-10-CM

## 2017-09-02 DIAGNOSIS — E871 Hypo-osmolality and hyponatremia: Secondary | ICD-10-CM | POA: Diagnosis present

## 2017-09-02 DIAGNOSIS — C787 Secondary malignant neoplasm of liver and intrahepatic bile duct: Secondary | ICD-10-CM | POA: Diagnosis present

## 2017-09-02 DIAGNOSIS — F609 Personality disorder, unspecified: Secondary | ICD-10-CM | POA: Diagnosis present

## 2017-09-02 DIAGNOSIS — E271 Primary adrenocortical insufficiency: Secondary | ICD-10-CM | POA: Diagnosis present

## 2017-09-02 DIAGNOSIS — J309 Allergic rhinitis, unspecified: Secondary | ICD-10-CM | POA: Diagnosis present

## 2017-09-02 DIAGNOSIS — L409 Psoriasis, unspecified: Secondary | ICD-10-CM | POA: Diagnosis present

## 2017-09-02 DIAGNOSIS — M328 Other forms of systemic lupus erythematosus: Secondary | ICD-10-CM

## 2017-09-02 DIAGNOSIS — E785 Hyperlipidemia, unspecified: Secondary | ICD-10-CM | POA: Diagnosis present

## 2017-09-02 DIAGNOSIS — L0390 Cellulitis, unspecified: Secondary | ICD-10-CM | POA: Diagnosis present

## 2017-09-02 DIAGNOSIS — L03116 Cellulitis of left lower limb: Secondary | ICD-10-CM | POA: Diagnosis present

## 2017-09-02 DIAGNOSIS — E039 Hypothyroidism, unspecified: Secondary | ICD-10-CM | POA: Diagnosis present

## 2017-09-02 DIAGNOSIS — L03115 Cellulitis of right lower limb: Principal | ICD-10-CM | POA: Diagnosis present

## 2017-09-02 DIAGNOSIS — G2581 Restless legs syndrome: Secondary | ICD-10-CM | POA: Diagnosis present

## 2017-09-02 DIAGNOSIS — Z91030 Bee allergy status: Secondary | ICD-10-CM

## 2017-09-02 DIAGNOSIS — M069 Rheumatoid arthritis, unspecified: Secondary | ICD-10-CM | POA: Diagnosis present

## 2017-09-02 DIAGNOSIS — M329 Systemic lupus erythematosus, unspecified: Secondary | ICD-10-CM | POA: Diagnosis present

## 2017-09-02 DIAGNOSIS — G43709 Chronic migraine without aura, not intractable, without status migrainosus: Secondary | ICD-10-CM | POA: Diagnosis present

## 2017-09-02 DIAGNOSIS — M79609 Pain in unspecified limb: Secondary | ICD-10-CM

## 2017-09-02 DIAGNOSIS — L930 Discoid lupus erythematosus: Secondary | ICD-10-CM | POA: Diagnosis present

## 2017-09-02 DIAGNOSIS — L97919 Non-pressure chronic ulcer of unspecified part of right lower leg with unspecified severity: Secondary | ICD-10-CM | POA: Diagnosis present

## 2017-09-02 DIAGNOSIS — S81809A Unspecified open wound, unspecified lower leg, initial encounter: Secondary | ICD-10-CM | POA: Diagnosis present

## 2017-09-02 DIAGNOSIS — E119 Type 2 diabetes mellitus without complications: Secondary | ICD-10-CM | POA: Diagnosis not present

## 2017-09-02 DIAGNOSIS — Z853 Personal history of malignant neoplasm of breast: Secondary | ICD-10-CM | POA: Diagnosis not present

## 2017-09-02 DIAGNOSIS — Z7952 Long term (current) use of systemic steroids: Secondary | ICD-10-CM

## 2017-09-02 DIAGNOSIS — Z9221 Personal history of antineoplastic chemotherapy: Secondary | ICD-10-CM | POA: Diagnosis not present

## 2017-09-02 DIAGNOSIS — S81809D Unspecified open wound, unspecified lower leg, subsequent encounter: Secondary | ICD-10-CM

## 2017-09-02 DIAGNOSIS — K219 Gastro-esophageal reflux disease without esophagitis: Secondary | ICD-10-CM | POA: Diagnosis present

## 2017-09-02 DIAGNOSIS — Z765 Malingerer [conscious simulation]: Secondary | ICD-10-CM

## 2017-09-02 DIAGNOSIS — Z880 Allergy status to penicillin: Secondary | ICD-10-CM

## 2017-09-02 DIAGNOSIS — Z801 Family history of malignant neoplasm of trachea, bronchus and lung: Secondary | ICD-10-CM

## 2017-09-02 DIAGNOSIS — L03119 Cellulitis of unspecified part of limb: Secondary | ICD-10-CM

## 2017-09-02 DIAGNOSIS — F329 Major depressive disorder, single episode, unspecified: Secondary | ICD-10-CM | POA: Diagnosis present

## 2017-09-02 DIAGNOSIS — F419 Anxiety disorder, unspecified: Secondary | ICD-10-CM | POA: Diagnosis present

## 2017-09-02 DIAGNOSIS — Z8614 Personal history of Methicillin resistant Staphylococcus aureus infection: Secondary | ICD-10-CM

## 2017-09-02 DIAGNOSIS — M7989 Other specified soft tissue disorders: Secondary | ICD-10-CM

## 2017-09-02 DIAGNOSIS — E876 Hypokalemia: Secondary | ICD-10-CM | POA: Diagnosis present

## 2017-09-02 DIAGNOSIS — Z886 Allergy status to analgesic agent status: Secondary | ICD-10-CM

## 2017-09-02 DIAGNOSIS — R911 Solitary pulmonary nodule: Secondary | ICD-10-CM

## 2017-09-02 DIAGNOSIS — Z881 Allergy status to other antibiotic agents status: Secondary | ICD-10-CM

## 2017-09-02 DIAGNOSIS — M5116 Intervertebral disc disorders with radiculopathy, lumbar region: Secondary | ICD-10-CM | POA: Diagnosis present

## 2017-09-02 DIAGNOSIS — F1120 Opioid dependence, uncomplicated: Secondary | ICD-10-CM | POA: Diagnosis present

## 2017-09-02 DIAGNOSIS — G4701 Insomnia due to medical condition: Secondary | ICD-10-CM

## 2017-09-02 DIAGNOSIS — Z91041 Radiographic dye allergy status: Secondary | ICD-10-CM

## 2017-09-02 LAB — CBC WITH DIFFERENTIAL/PLATELET
BASOS ABS: 0 10*3/uL (ref 0.0–0.1)
Basophils Relative: 0 %
EOS ABS: 0.1 10*3/uL (ref 0.0–0.7)
EOS PCT: 1 %
HCT: 38.1 % (ref 36.0–46.0)
Hemoglobin: 12.3 g/dL (ref 12.0–15.0)
LYMPHS ABS: 1 10*3/uL (ref 0.7–4.0)
LYMPHS PCT: 12 %
MCH: 29.5 pg (ref 26.0–34.0)
MCHC: 32.3 g/dL (ref 30.0–36.0)
MCV: 91.4 fL (ref 78.0–100.0)
MONO ABS: 1 10*3/uL (ref 0.1–1.0)
Monocytes Relative: 11 %
Neutro Abs: 6.8 10*3/uL (ref 1.7–7.7)
Neutrophils Relative %: 76 %
PLATELETS: 238 10*3/uL (ref 150–400)
RBC: 4.17 MIL/uL (ref 3.87–5.11)
RDW: 14.6 % (ref 11.5–15.5)
WBC: 9 10*3/uL (ref 4.0–10.5)

## 2017-09-02 LAB — COMPREHENSIVE METABOLIC PANEL
ALBUMIN: 4.1 g/dL (ref 3.5–5.0)
ALT: 18 U/L (ref 0–44)
ANION GAP: 14 (ref 5–15)
AST: 41 U/L (ref 15–41)
Alkaline Phosphatase: 74 U/L (ref 38–126)
BUN: 10 mg/dL (ref 8–23)
CHLORIDE: 88 mmol/L — AB (ref 98–111)
CO2: 31 mmol/L (ref 22–32)
Calcium: 8.9 mg/dL (ref 8.9–10.3)
Creatinine, Ser: 1.11 mg/dL — ABNORMAL HIGH (ref 0.44–1.00)
GFR calc Af Amer: >60 mL/min (ref >60)
GFR calc non Af Amer: 52 mL/min — ABNORMAL LOW (ref >60)
GLUCOSE: 145 mg/dL — AB (ref 70–99)
POTASSIUM: 3.2 mmol/L — AB (ref 3.5–5.1)
Sodium: 133 mmol/L — ABNORMAL LOW (ref 135–145)
Total Bilirubin: 2.1 mg/dL — ABNORMAL HIGH (ref 0.3–1.2)
Total Protein: 7.6 g/dL (ref 6.5–8.1)

## 2017-09-02 LAB — CBG MONITORING, ED: GLUCOSE-CAPILLARY: 137 mg/dL — AB (ref 70–99)

## 2017-09-02 LAB — LACTIC ACID, PLASMA: Lactic Acid, Venous: 1.5 mmol/L (ref 0.5–1.9)

## 2017-09-02 MED ORDER — CLINDAMYCIN PHOSPHATE 600 MG/50ML IV SOLN
600.0000 mg | Freq: Once | INTRAVENOUS | Status: AC
Start: 2017-09-02 — End: 2017-09-02
  Administered 2017-09-02: 600 mg via INTRAVENOUS
  Filled 2017-09-02: qty 50

## 2017-09-02 MED ORDER — SULFAMETHOXAZOLE-TRIMETHOPRIM 800-160 MG PO TABS
1.0000 | ORAL_TABLET | Freq: Once | ORAL | Status: AC
  Administered 2017-09-02: 1 via ORAL
  Filled 2017-09-02: qty 1

## 2017-09-02 MED ORDER — POTASSIUM CHLORIDE CRYS ER 20 MEQ PO TBCR
40.0000 meq | EXTENDED_RELEASE_TABLET | Freq: Once | ORAL | Status: AC
  Administered 2017-09-03: 40 meq via ORAL
  Filled 2017-09-02: qty 2

## 2017-09-02 MED ORDER — OXYCODONE-ACETAMINOPHEN 5-325 MG PO TABS
1.0000 | ORAL_TABLET | Freq: Once | ORAL | Status: AC
  Administered 2017-09-02: 1 via ORAL
  Filled 2017-09-02: qty 1

## 2017-09-02 MED ORDER — LACTATED RINGERS IV BOLUS
1000.0000 mL | Freq: Once | INTRAVENOUS | Status: AC
  Administered 2017-09-02: 1000 mL via INTRAVENOUS

## 2017-09-02 MED ORDER — ONDANSETRON 4 MG PO TBDP
4.0000 mg | ORAL_TABLET | Freq: Once | ORAL | Status: AC
  Administered 2017-09-02: 4 mg via ORAL
  Filled 2017-09-02: qty 1

## 2017-09-02 MED ORDER — SULFAMETHOXAZOLE-TRIMETHOPRIM 800-160 MG PO TABS: 1.0000 | ORAL_TABLET | Freq: Once | ORAL | Status: DC

## 2017-09-02 NOTE — Progress Notes (Signed)
VASCULAR LAB PRELIMINARY  PRELIMINARY  PRELIMINARY  PRELIMINARY  Left lower extremity venous duplex completed.    Preliminary report:  There is no DVT or SVT noted in the left lower extremity. Multiple enlarged inguinal lymph nodes noted.  Gave results to Dr. Alfonso Ellis, [place], RVT 09/02/2017, 8:11 PM

## 2017-09-02 NOTE — ED Notes (Signed)
Spoke to [employer] ad she has a stat peds U/S and will be here after that is done.18:00

## 2017-09-02 NOTE — ED Provider Notes (Signed)
Emergency Department Provider Note   I have reviewed the triage vital signs and the nursing notes.   HISTORY  Chief Complaint Wound Check   HPI [name] Carr is a 62 y.o. female history of recurrent bilateral lower extremity cellulitis, diabetes, lupus who presents to the emergency department secondary to worsening lower extremity swelling and redness and sores.  Patient states this is similar to previous episodes of cellulitis.  She states she always has to get admitted for IV antibiotics hour and a recent presentation she was discharged on home antibiotics.  Patient states that she gets admitted because of nausea. No fever.  No other associated or modifying symptoms.    Past Medical History:  Diagnosis Date  . Abnormal brain MRI   . Addison's disease ([place])   . ALLERGIC RHINITIS   . ANEMIA-IRON DEFICIENCY   . Anxiety   . At risk for falls   . Bacteremia   . Blood clot in vein 2011; 02/04/11   "right jugular vein;left jugular vein"  . CARCINOMA, BREAST 08/2009 dx   R breast,  mets to liver - resolution s/p chemo, s/p B mastect  . Cephalgia   . Chronic constipation   . Chronic migraine w/o aura w/o status migrainosus, not intractable   . Chronic pain syndrome    chronic narcotics, dependancy hx - [place] for pain mgmt  . DDD (degenerative disc disease)   . Degenerative joint disease   . DEPRESSION   . DIABETES MELLITUS, TYPE II   . Disease of thyroid gland   . Fibromyalgia   . GERD   . HYPERLIPIDEMIA   . Hypertension   . Hypothyroidism pt states she has never had hy pothyroidism  . Leukocytosis   . Lumbar degenerative disc disease   . Lumbar radiculopathy   . Lupus erythematosus   . Lyme disease   . 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 MIGRAINE HEADACHE   . MRSA (methicillin resistant staph aureus) culture positive   . Munchausen syndrome   . Myoclonus   . Narcotic abuse ([place])   . Neuropathy    hands, feet, due to diabetes & back problem  . Opioid dependence  ([place])   . Peripheral neuropathy   . PERSONALITY DISORDER   . Phlebitis after infusion    left ?calf; "S/P phenergan/demerol injection"  . Psoriasis   . RA (rheumatoid arthritis) ([place])   . SEIZURE DISORDER    "lots of seizures; due to lupus"  . SLE (systemic lupus erythematosus) ([facility])    rheum at [place] ([place]) - chronic pred  . Sleeping difficulties   . Small vessel disease ([place])   . Speech disturbance   . Surgical wound infection 02/15/2011   R shoulder and mastectomy site - related to PICC  . Therapeutic drug monitoring     Patient Active Problem List   Diagnosis Date Noted  . Cellulitis of left leg 05/06/2016  . Wounds, multiple open, lower extremity, left, subsequent encounter 05/06/2016  . Addison's disease ([place]) 05/06/2016  . Wounds, multiple open, lower extremity 09/24/2015  . Multiple open wounds of right lower extremity 09/24/2015  . Wound of upper extremity 09/24/2015  . Sepsis ([place]) 09/21/2015  . Abnormal brain MRI   . At risk for falls   . Bacteremia   . Cephalgia   . Chronic migraine w/o aura w/o status migrainosus, not intractable   . Disease of thyroid gland   . Fibromyalgia   . Leukocytosis   . Lumbar degenerative  disc disease   . Lumbar radiculopathy   . Lupus erythematosus   . MRSA (methicillin resistant staph aureus) culture positive   . Myoclonus   . Narcotic abuse ([place])   . Opioid dependence ([place])   . Peripheral neuropathy   . Therapeutic drug monitoring   . Fever   . Somnolence 05/25/2014  . Hypotension 05/25/2014  . FUO (fever of unknown origin)   . Anemia 05/24/2014  . Thigh abscess 05/23/2014  . Abscess   . MRSA infection   . Munchausen syndrome   . Malingering   . 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 Drug-seeking behavior   . Infection 05/09/2014  . Abscess of left thigh 05/08/2014  . Diabetes mellitus type 2, controlled ([place]) 05/08/2014  . 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 Neuropathy 08/28/2013  . Hypertension   . Lower extremity cellulitis 08/22/2013  . Hypokalemia 08/22/2013  . Syncope 01/11/2012  . Hyponatremia 12/17/2011  . Cellulitis 02/09/2011  . SLE-on prednisone-Follow at [facility] 02/05/2011  . DVT L IJ 12/26/10 02/04/2011  . Normocytic anemia 12/27/2010  . Malignant neoplasm of female breast ([place]) 09/07/2009  . Type 2 diabetes mellitus ([place]) 09/07/2009  . HYPERLIPIDEMIA 09/07/2009  . Iron deficiency anemia 09/07/2009  . Chronic pain syndrome 09/07/2009  . MIGRAINE HEADACHE 09/07/2009  . ALLERGIC RHINITIS 09/07/2009  . GERD 09/07/2009  . SEIZURE DISORDER 09/07/2009  . PERSONALITY DISORDER 09/01/2009  . Major depression, recurrent, chronic ([place]) 09/01/2009  . RENAL INSUFFICIENCY 09/01/2009    Past Surgical History:  Procedure Laterality Date  . APPLICATION OF WOUND VAC Left 05/23/2014   Procedure: APPLICATION OF WOUND VAC;  Surgeon: Meredith Pel, MD;  Location: [facility];  Service: Orthopedics;  Laterality: Left;  . BREAST SURGERY  09/2009   right breast biopsy  . I&D EXTREMITY  ~ 2010   right hand; S/P "dog bite"  . I&D EXTREMITY Left 05/09/2014   Procedure: IRRIGATION AND DEBRIDEMENT LEFT THIGH ABSCESS;  Surgeon: Meredith Pel, MD;  Location: [facility];  Service: Orthopedics;  Laterality: Left;  . I&D EXTREMITY Left 05/23/2014   Procedure: IRRIGATION AND DEBRIDEMENT EXTREMITY;  Surgeon: Meredith Pel, MD;  Location: [facility];  Service: Orthopedics;  Laterality: Left;  . INCISION AND DRAINAGE ABSCESS Left 12/22/2013   Procedure: INCISION AND DRAINAGE ABSCESS OF LEFT THIGH;  Surgeon: Donnie Mesa, MD;  Location: [facility];  Service: General;  Laterality: Left;  . liver bopsy  09/2009  . MASTECTOMY  11/19/10   bilateral mastectomy   . PERIPHERALLY INSERTED CENTRAL CATHETER INSERTION    . port a cath placement  09/2009; 11/19/10  . PORT-A-CATH REMOVAL  04/2010   "due to staph & strept"  . PORT-A-CATH REMOVAL   12/23/2010   Procedure: REMOVAL PORT-A-CATH;  Surgeon: Pedro Earls, MD;  Location: [facility];  Service: General;  Laterality: Left;  . spider bite      Current Outpatient Rx  . Order #: [id number] Class: Historical Med  . Order #: [id number] Class: Normal  . Order #: [id number] Class: Historical Med  . Order #: [id number] Class: Historical Med  . Order #: [id number] Class: Historical Med  . Order #: [id number] Class: Historical Med  . Order #: [id number] Class: Historical Med  . Order #: [id number] Class: Historical Med  . Order #: [id number] Class: Historical Med  . Order #: [id number] Class: Historical Med  . Order #: [id number] Class: Historical Med  . Order #: [id number] Class: Historical Med  . Order #:  [id number] Class: Historical Med  . Order #: [id number] Class: Historical Med  . Order #: [id number] Class: Historical Med  . Order #: [id number] Class: Historical Med  . Order #: [id number] Class: Historical Med  . Order #: [id number] Class: Historical Med  . Order #: [id number] Class: Normal    Allergies Ammonia; Bee venom; Contrast media [iodinated diagnostic agents]; Infliximab; Iohexol; Methotrexate; Midazolam hcl; Nalbuphine; Pentazocine; Hydroxychloroquine; Ketorolac tromethamine; Morphine and related; Nsaids; Penicillins; Rofecoxib; Tetracyclines & related; Celecoxib; and Midazolam  Family History  Problem Relation Age of Onset  . Lung cancer Mother   . Diabetes Mother   . Lung cancer Father   . Diabetes Father   . Arthritis Other   . Diabetes Other   . Hyperlipidemia Other   . Diabetes Brother     Social History Social History   Tobacco Use  . Smoking status: Never Smoker  . Smokeless tobacco: Never Used  . Tobacco comment: Never used tobacco  Substance Use Topics  . Alcohol use: No    Alcohol/week: 0.0 oz  . Drug use: No    Review of Systems  All other systems negative except as documented in the HPI. All pertinent positives and negatives as reviewed in the  HPI. ____________________________________________   PHYSICAL EXAM:  VITAL SIGNS: ED Triage Vitals  Enc Vitals Group     BP 09/02/17 1535 (!) 162/68     Pulse Rate 09/02/17 1535 92     Resp 09/02/17 1535 16     Temp --      Temp src --      SpO2 09/02/17 1535 97 %     Weight --      Height --      Head Circumference --      Peak Flow --      Pain Score 09/02/17 1549 5     Pain Loc --      Pain Edu? --      Excl. in [place]? --     Constitutional: Alert and oriented. Well appearing and in no acute distress. Eyes: Conjunctivae are normal. PERRL. EOMI. Head: Atraumatic. Nose: No congestion/rhinnorhea. Mouth/Throat: Mucous membranes are moist.  Oropharynx non-erythematous. Neck: No stridor.  No meningeal signs.   Cardiovascular: Normal rate, regular rhythm. Good peripheral circulation. Grossly normal heart sounds.   Respiratory: Normal respiratory effort.  No retractions. Lungs CTAB. Gastrointestinal: Soft and nontender. No distention.  Musculoskeletal: No lower extremity tenderness nor edema. No gross deformities of extremities. Neurologic:  Normal speech and language. No gross focal neurologic deficits are appreciated.  Skin:  Skin is warm, dry and intact. See haiku picture below.               Images contained in this chart were taken by me using Haiku application on my iPhone after verbal consent from the patient. No images were stored in any way on my personal device.    ____________________________________________   LABS (all labs ordered are listed, but only abnormal results are displayed)  Labs Reviewed  COMPREHENSIVE METABOLIC PANEL - Abnormal; Notable for the following components:      Result Value   Sodium 133 (*)    Potassium 3.2 (*)    Chloride 88 (*)    Glucose, Bld 145 (*)    Creatinine, Ser 1.11 (*)    Total Bilirubin 2.1 (*)    GFR calc non Af Amer 52 (*)    All other components within normal limits  CBG MONITORING, ED - Abnormal; Notable  for the following components:  Glucose-Capillary 137 (*)    All other components within normal limits  CBC WITH DIFFERENTIAL/PLATELET  LACTIC ACID, PLASMA  LACTIC ACID, PLASMA  BRAIN NATRIURETIC PEPTIDE  RAPID URINE DRUG SCREEN, HOSP PERFORMED  MAGNESIUM  PHOSPHORUS   ____________________________________________     INITIAL IMPRESSION / ASSESSMENT AND PLAN / ED COURSE  Patient has a history of venous stasis and venous stasis ulcers but today appears to have worsening cellulitis from the same.  She also has a pretty significant right toe wound.  There history of diabetes and lupus which makes outpatient treatment high risk.  Start antibiotics and admit to the hospital.  After hospitalist evaluation appears the patient has a history of difficult to treat cellulitis and noncompliant behavior along with purposely making herself more ill.     Pertinent labs & imaging results that were available during my care of the patient were reviewed by me and considered in my medical decision making (see chart for details).  ____________________________________________  FINAL CLINICAL IMPRESSION(S) / ED DIAGNOSES  Final diagnoses:  Cellulitis, unspecified cellulitis site     MEDICATIONS GIVEN DURING THIS VISIT:  Medications  potassium chloride SA (K-DUR,KLOR-CON) CR tablet 40 mEq (has no administration in time range)  oxyCODONE-acetaminophen (PERCOCET/ROXICET) 5-325 MG per tablet 1 tablet (1 tablet Oral Given 09/02/17 1739)  ondansetron (ZOFRAN-ODT) disintegrating tablet 4 mg (4 mg Oral Given 09/02/17 1739)  sulfamethoxazole-trimethoprim (BACTRIM DS,SEPTRA DS) 800-160 MG per tablet 1 tablet (1 tablet Oral Given 09/02/17 1739)  lactated ringers bolus 1,000 mL (0 mLs Intravenous Stopped 09/02/17 2327)  clindamycin (CLEOCIN) IVPB 600 mg (0 mg Intravenous Stopped 09/02/17 2327)     NEW OUTPATIENT MEDICATIONS STARTED DURING THIS VISIT:  New Prescriptions   No medications on file    Note:   This note was prepared with assistance of Dragon voice recognition software. Occasional wrong-word or sound-a-like substitutions may have occurred due to the inherent limitations of voice recognition software.   Jolyssa Oplinger, Corene Cornea, MD 09/03/17 [phone]

## 2017-09-02 NOTE — H&P (Signed)
[name] Carr IRC:[id number] DOB: [birth year] DOA: 09/02/2017     PCP: [name], MD   Outpatient Specialists: Other doctors at [facility] and [facility]    Oncology  Dr. Marin Olp Patient arrived to ER on 09/02/17 at 1507  Patient coming from:   home Lives alone,       Chief Complaint:  Chief Complaint  Patient presents with  . Wound Check    HPI: [name] Carr is a 62 y.o. 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  Worse from baseline she has chronic sores bilaterally.  REports  fevers or chills.  Patient requesting admission for IV antibiotics because she states she cannot tolerate p.o. at home of note patient has complex medical history secondary to chronic cellulitis with medical noncompliance and refusal to take oral antibiotics with history of prescription drug abuse and polypharmacy Has been evaluated at [facility] for cellulitis on 1 July for the same at that time was discharged home from ER Last time patient was admitted to our system in 2016 there was some possibility of PICC line abuse.  Patient has refused skilled nursing placement or outpatient physical therapy Prednisone 10 mg a day for history of lupus. Last admission to [facility] was on 3 June secondary to cellulitis and dehydration ID was consulted recommended discharge on clindamycin 450 mg p.o. 3 times a day for 10 days blood cultures showed no growth recommended SNF at that time refused refused outpatient physical therapy as well      While in ER: Doppler was done to rule out DVT  Following Medications were ordered in ER: Medications  lactated ringers bolus 1,000 mL (1,000 mLs  Intravenous New Bag/Given 09/02/17 2216)  clindamycin (CLEOCIN) IVPB 600 mg (has no administration in time range)  oxyCODONE-acetaminophen (PERCOCET/ROXICET) 5-325 MG per tablet 1 tablet (1 tablet Oral Given 09/02/17 1739)  ondansetron (ZOFRAN-ODT) disintegrating tablet 4 mg (4 mg Oral Given 09/02/17 1739)  sulfamethoxazole-trimethoprim (BACTRIM DS,SEPTRA DS) 800-160 MG per tablet 1 tablet (1 tablet Oral Given 09/02/17 1739)    Significant initial  Findings: Abnormal Labs Reviewed  COMPREHENSIVE METABOLIC PANEL - Abnormal; Notable for the following components:      Result Value   Sodium 133 (*)    Potassium 3.2 (*)    Chloride 88 (*)    Glucose, Bld 145 (*)    Creatinine, Ser 1.11 (*)    Total Bilirubin 2.1 (*)    GFR calc non Af Amer 52 (*)    All other components within normal limits  CBG MONITORING, ED - Abnormal; Notable for the following components:   Glucose-Capillary 137 (*)    All other components within normal limits     Na 133 K 3.2  Cr   stable,     Lab Results  Component Value Date   CREATININE 1.11 (H) 09/02/2017   CREATININE 1.00 08/23/2017   CREATININE 1.0 07/27/2016      WBC  9.0  HG/HCT stable,       Component Value Date/Time   HGB 12.3 09/02/2017 1815   HGB 11.8 08/23/2017 1356   HGB 11.7 07/27/2016 1427   HGB 8.4 (L) 04/08/2013 1148   HCT 38.1 09/02/2017  1815   HCT 36.8 07/27/2016 1427   HCT 27.7 (L) 04/08/2013 1148     Lactic Acid, Venous    Component Value Date/Time   LATICACIDVEN 1.5 09/02/2017 1645      UA  not ordered    ECG: not obtained    ED Triage Vitals  Enc Vitals Group     BP 09/02/17 1535 (!) 162/68     Pulse Rate 09/02/17 1535 92     Resp 09/02/17 1535 16     Temp --      Temp src --      SpO2 09/02/17 1535 97 %     Weight 09/02/17 2215 183 lb (83 kg)     Height 09/02/17 2215 5\' 4"  (1.626 m)     Head Circumference --      Peak Flow --      Pain Score 09/02/17 1549 5     Pain Loc --      Pain Edu? --      Excl.  in [place]? --   TMAX(24)@       Latest  Blood pressure (!) 148/82, pulse 94, resp. rate 18, height 5\' 4"  (1.626 m), weight 83 kg (183 lb), SpO2 96 %.    Hospitalist was called for admission for cellulitis   Review of Systems:    Pertinent positives include:  fatigue, bilateral leg edema as well as some redness  Constitutional:  No weight loss, night sweats, Fevers, chills,  weight loss  HEENT:  No headaches, Difficulty swallowing,Tooth/dental problems,Sore throat,  No sneezing, itching, ear ache, nasal congestion, post nasal drip,  Cardio-vascular:  No chest pain, Orthopnea, PND, anasarca, dizziness, palpitations.no Bilateral lower extremity swelling  GI:  No heartburn, indigestion, abdominal pain, nausea, vomiting, diarrhea, change in bowel habits, loss of appetite, melena, blood in stool, hematemesis Resp:  no shortness of breath at rest. No dyspnea on exertion, No excess mucus, no productive cough, No non-productive cough, No coughing up of blood.No change in color of mucus.No wheezing. Skin:  no rash or lesions. No jaundice GU:  no dysuria, change in color of urine, no urgency or frequency. No straining to urinate.  No flank pain.  Musculoskeletal:  No joint pain or no joint swelling. No decreased range of motion. No back pain.  Psych:  No change in mood or affect. No depression or anxiety. No memory loss.  Neuro: no localizing neurological complaints, no tingling, no weakness, no double vision, no gait abnormality, no slurred speech, no confusion  All systems reviewed and apart from [place] all are negative  Past Medical History:   Past Medical History:  Diagnosis Date  . Abnormal brain MRI   . Addison's disease ([place])   . ALLERGIC RHINITIS   . ANEMIA-IRON DEFICIENCY   . Anxiety   . At risk for falls   . Bacteremia   . Blood clot in vein 2011; 02/04/11   "right jugular vein;left jugular vein"  . CARCINOMA, BREAST 08/2009 dx   R breast,  mets to liver - resolution s/p  chemo, s/p B mastect  . Cephalgia   . Chronic constipation   . Chronic migraine w/o aura w/o status migrainosus, not intractable   . Chronic pain syndrome    chronic narcotics, dependancy hx - [place] for pain mgmt  . DDD (degenerative disc disease)   . Degenerative joint disease   . DEPRESSION   . DIABETES MELLITUS, TYPE II   . Disease of thyroid gland   . Fibromyalgia   .  GERD   . HYPERLIPIDEMIA   . Hypertension   . Hypothyroidism pt states she has never had hy pothyroidism  . Leukocytosis   . Lumbar degenerative disc disease   . Lumbar radiculopathy   . Lupus erythematosus   . Lyme disease   . Metastases to the liver ([place])   . MIGRAINE HEADACHE   . MRSA (methicillin resistant staph aureus) culture positive   . Munchausen syndrome   . Myoclonus   . Narcotic abuse ([place])   . Neuropathy    hands, feet, due to diabetes & back problem  . Opioid dependence ([place])   . Peripheral neuropathy   . PERSONALITY DISORDER   . Phlebitis after infusion    left ?calf; "S/P phenergan/demerol injection"  . Psoriasis   . RA (rheumatoid arthritis) ([place])   . SEIZURE DISORDER    "lots of seizures; due to lupus"  . SLE (systemic lupus erythematosus) ([facility])    rheum at [place] ([place]) - chronic pred  . Sleeping difficulties   . Small vessel disease ([place])   . Speech disturbance   . Surgical wound infection 02/15/2011   R shoulder and mastectomy site - related to PICC  . Therapeutic drug monitoring       Past Surgical History:  Procedure Laterality Date  . APPLICATION OF WOUND VAC Left 05/23/2014   Procedure: APPLICATION OF WOUND VAC;  Surgeon: Meredith Pel, MD;  Location: [facility];  Service: Orthopedics;  Laterality: Left;  . BREAST SURGERY  09/2009   right breast biopsy  . I&D EXTREMITY  ~ 2010   right hand; S/P "dog bite"  . I&D EXTREMITY Left 05/09/2014   Procedure: IRRIGATION AND DEBRIDEMENT LEFT THIGH ABSCESS;  Surgeon: Meredith Pel, MD;  Location: [facility];  Service: Orthopedics;   Laterality: Left;  . I&D EXTREMITY Left 05/23/2014   Procedure: IRRIGATION AND DEBRIDEMENT EXTREMITY;  Surgeon: Meredith Pel, MD;  Location: [facility];  Service: Orthopedics;  Laterality: Left;  . INCISION AND DRAINAGE ABSCESS Left 12/22/2013   Procedure: INCISION AND DRAINAGE ABSCESS OF LEFT THIGH;  Surgeon: Donnie Mesa, MD;  Location: [facility];  Service: General;  Laterality: Left;  . liver bopsy  09/2009  . MASTECTOMY  11/19/10   bilateral mastectomy   . PERIPHERALLY INSERTED CENTRAL CATHETER INSERTION    . port a cath placement  09/2009; 11/19/10  . PORT-A-CATH REMOVAL  04/2010   "due to staph & strept"  . PORT-A-CATH REMOVAL  12/23/2010   Procedure: REMOVAL PORT-A-CATH;  Surgeon: Pedro Earls, MD;  Location: [facility];  Service: General;  Laterality: Left;  . spider bite      Social History:  Ambulatory cain or  wheelchair bound,       reports that she has never smoked. She has never used smokeless tobacco. She reports that she does not drink alcohol or use drugs.     Family History:   Family History  Problem Relation Age of Onset  . Lung cancer Mother   . Diabetes Mother   . Lung cancer Father   . Diabetes Father   . Arthritis Other   . Diabetes Other   . Hyperlipidemia Other   . Diabetes Brother     Allergies: Allergies  Allergen Reactions  . Ammonia Other (See Comments)    Other reaction(s): ASTHMA Ended up on ventilator from ammonia tabs  . Contrast Media [Iodinated Diagnostic Agents] Other (See Comments)    Other reaction(s): DIFFICULTY BREATHING Doesn't breath well.  . Iohexol  Other (See Comments)    SOB   . Methotrexate Anaphylaxis  . Midazolam Hcl Anaphylaxis  . Nalbuphine Other (See Comments)    Can't breathe well  . Bee Venom   . Hydroxychloroquine   . Infliximab Nausea And Vomiting  . Ketorolac Tromethamine Other (See Comments)    Injectable doesn't work and pill hurts stomach.  . Morphine And Related Hives  . Nsaids Nausea And Vomiting  .  Penicillins   . Pentazocine   . Rofecoxib   . Tetracyclines & Related     Other reaction(s): ANAPHYLAXIS  . Celecoxib Rash  . Midazolam Anxiety    Other reaction(s): CAUSES ANXIETY     Prior to Admission medications   Medication Sig Start Date End Date Taking? Authorizing Provider  clonazePAM (KLONOPIN) 0.5 MG tablet Take 0.5 mg by mouth 2 (two) times daily.     [provider]  DULoxetine (CYMBALTA) 60 MG capsule Take 1 capsule (60 mg total) by mouth 2 (two) times daily. 06/11/12   Rowe Clack, MD  fluconazole (DIFLUCAN) 100 MG tablet Take 1 tablet (100 mg total) by mouth daily. Patient not taking: Reported on 08/23/2017 07/27/16   Volanda Napoleon, MD  furosemide (LASIX) 20 MG tablet Take 20 mg by mouth daily.    [provider]  gabapentin (NEURONTIN) 800 MG tablet Take 800 mg by mouth 3 (three) times daily.    [provider]  hydrochlorothiazide (HYDRODIURIL) 25 MG tablet Take 25 mg by mouth daily.    [provider]  HYDROcodone-acetaminophen (NORCO/VICODIN) 5-325 MG tablet Take 1 tablet by mouth every 4 (four) hours.    [provider]  linaclotide (LINZESS) 290 MCG CAPS capsule Take 290 mcg by mouth daily as needed.     [provider]  methadone (DOLOPHINE) 5 MG tablet Take 5 mg by mouth 2 (two) times daily.    [provider]  metoprolol tartrate (LOPRESSOR) 25 MG tablet Take 25 mg by mouth 2 (two) times daily.    [provider]  Multiple Vitamins-Minerals (MULTIVITAMIN WITH MINERALS) tablet Take 1 tablet by mouth daily.    [provider]  nystatin (MYCOSTATIN/NYSTOP) powder Apply twice daily to affected area 09/14/16   [provider]  ondansetron (ZOFRAN) 8 MG tablet Take 8 mg by mouth 3 (three) times daily as needed for nausea or vomiting.     [provider]  pantoprazole (PROTONIX) 40 MG tablet Take 40 mg by mouth 2 (two) times daily.    [provider]  polyvinyl  alcohol (LIQUIFILM TEARS) 1.4 % ophthalmic solution Place 1 drop into both eyes every 2 (two) hours as needed for dry eyes.     [provider]  potassium chloride (KLOR-CON) 20 MEQ packet Take 20 mEq by mouth daily.    [provider]  predniSONE (DELTASONE) 10 MG tablet Take 10 mg by mouth daily with breakfast.    [provider]  promethazine (PHENERGAN) 25 MG tablet Take 25 mg by mouth every 6 (six) hours as needed for nausea or vomiting.    [provider]  rOPINIRole (REQUIP) 2 MG tablet Take 2 mg by mouth at bedtime.    [provider]  senna-docusate (SENOKOT-S) 8.6-50 MG per tablet Take 2 tablets by mouth 2 (two) times daily.     [provider]  spironolactone (ALDACTONE) 25 MG tablet Take 50 mg by mouth daily. 2 tablets    [provider]  traZODone (DESYREL) 50 MG tablet  TAKE 1 TABLET(50 MG) BY MOUTH AT BEDTIME AS NEEDED FOR SLEEP 06/26/17   [place], Holli Humbles, NP   Physical Exam: Blood pressure (!) 148/82, pulse 94, resp. rate 18, height 5\' 4"  (1.626 m), weight 83 kg (183 lb), SpO2 96 %. 1. General:  in No Acute distress   Chronically ill -appearing 2. Psychological: Alert and   Oriented 3. Head/ENT:   Moist Mucous Membranes                          Head Non traumatic, neck supple                            Poor Dentition 4. SKIN  decreased Skin turgor,  Skin clean Dry multiple wounds            5. Heart: Regular rate and rhythm no Murmur, no Rub or gallop 6. Lungs:  no wheezes or crackles   7. Abdomen: Soft, non-tender, Non distended   obese  bowel sounds present 8. Lower extremities: no clubbing, cyanosis, or  edema 9. Neurologically Grossly intact, moving all 4 extremities equally   10. MSK: Normal range of motion   LABS:     Recent Labs  Lab 09/02/17 1815  WBC 9.0  NEUTROABS 6.8  HGB 12.3  HCT 38.1  MCV 91.4  PLT 983   Basic Metabolic Panel: Recent Labs  Lab 09/02/17 1815  NA 133*  K  3.2*  CL 88*  CO2 31  GLUCOSE 145*  BUN 10  CREATININE 1.11*  CALCIUM 8.9      Recent Labs  Lab 09/02/17 1815  AST 41  ALT 18  ALKPHOS 74  BILITOT 2.1*  PROT 7.6  ALBUMIN 4.1   No results for input(s): LIPASE, AMYLASE in the last 168 hours. No results for input(s): AMMONIA in the last 168 hours.    HbA1C: No results for input(s): HGBA1C in the last 72 hours. CBG: Recent Labs  Lab 09/02/17 1541  GLUCAP 137*      Urine analysis:    Component Value Date/Time   COLORURINE YELLOW 05/25/2014 [place] 05/25/2014 1716   LABSPEC 1.005 05/25/2014 1716   PHURINE 6.5 05/25/2014 1716   GLUCOSEU NEGATIVE 05/25/2014 1716   HGBUR NEGATIVE 05/25/2014 [place] 05/25/2014 1716   BILIRUBINUR neg 07/08/2011 1333   [place] 05/25/2014 1716   PROTEINUR NEGATIVE 05/25/2014 1716   UROBILINOGEN 0.2 05/25/2014 1716   NITRITE NEGATIVE 05/25/2014 1716   LEUKOCYTESUR NEGATIVE 05/25/2014 1716       Cultures:    Component Value Date/Time   SDES URINE, CLEAN CATCH 05/25/2014 1715   [place] 05/25/2014 1715   CULT NO GROWTH Performed at [place]  05/25/2014 1715   REPTSTATUS 05/27/2014 FINAL 05/25/2014 1715     Radiological Exams on Admission: No results found.  Chart has been reviewed    Assessment/Plan   62 y.o. 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 Cellulitis recurrent in the past have improved with clindamycin we will continue switch to p.o. when  able to tolerate patient has known history of MRSA, history of drug-seeking behavior  and possible PICC line abuse would avoid discharging to home with PICC line . Chronic pain syndrome continue home medications . Hypertension stable continue home medications  . Lupus erythematosus restart prednisone patient states she missed a few doses . Opioid dependence ([place]) avoid IV narcotics check methadone in urine to evaluate for diversion . Wounds, multiple open, lower extremity -wound care consult patient may benefit from outpatient home health wound care follow-up . Hypokalemia we will replace check magnesium level   . Addison's disease ([place]) patient has been on chronic steroids will make sure to restart monitor for any evidence of adrenal insufficiency  DM 2 -order sliding scale check hemoglobin A1c  History of depression continue home medications Other plan as per orders.  DVT prophylaxis:  Lovenox     Code Status:  FULL CODE  as per patient   I had personally discussed CODE STATUS with patient    Family Communication:   Family not  at  Bedside    Disposition Plan:  likely will need placement for rehabilitation                                               Would benefit from PT/OT eval prior to DC  Ordered                                   Wound care  consulted                                        Consults called: none   Admission status:   inpatient       Level of care      medical floor            Toy Baker 09/03/2017, 12:06 AM   [facility]  Pager [phone]   after 2 AM please page floor coverage PA If 7AM-7PM, please contact the day team taking care of the patient  [email or web address]  Password [email or web address]

## 2017-09-02 NOTE — ED Notes (Signed)
Pt placed on steady and transported to use restroom.

## 2017-09-02 NOTE — ED Triage Notes (Signed)
She c/o bilat. Lower leg pain and redness. She has chronic sores on bilat. Lower legs "that put out fluids sometimes". She also tells [place] that she has "diabetes and lupus". She is in good spirits and is in no distress. her bilat. Lower legs are erythematous and edematous. She further has an open area at proximal ant. Left thigh which she states "has been there ever since they did an I & D on it three years ago".

## 2017-09-03 ENCOUNTER — Other Ambulatory Visit: Payer: Self-pay

## 2017-09-03 LAB — BRAIN NATRIURETIC PEPTIDE: B Natriuretic Peptide: 73 pg/mL (ref 0.0–100.0)

## 2017-09-03 LAB — GLUCOSE, CAPILLARY
GLUCOSE-CAPILLARY: 117 mg/dL — AB (ref 70–99)
GLUCOSE-CAPILLARY: 345 mg/dL — AB (ref 70–99)
Glucose-Capillary: 172 mg/dL — ABNORMAL HIGH (ref 70–99)
Glucose-Capillary: 160 mg/dL — ABNORMAL HIGH (ref 70–99)

## 2017-09-03 LAB — TSH: TSH: 2.765 u[IU]/mL (ref 0.350–4.500)

## 2017-09-03 LAB — CBC
HEMATOCRIT: 37.3 % (ref 36.0–46.0)
HEMOGLOBIN: 11.8 g/dL — AB (ref 12.0–15.0)
MCH: 28.6 pg (ref 26.0–34.0)
MCHC: 31.6 g/dL (ref 30.0–36.0)
MCV: 90.3 fL (ref 78.0–100.0)
Platelets: 227 10*3/uL (ref 150–400)
RBC: 4.13 MIL/uL (ref 3.87–5.11)
RDW: 14.6 % (ref 11.5–15.5)
WBC: 6.7 10*3/uL (ref 4.0–10.5)

## 2017-09-03 LAB — COMPREHENSIVE METABOLIC PANEL
ALBUMIN: 3.8 g/dL (ref 3.5–5.0)
ALK PHOS: 69 U/L (ref 38–126)
ALT: 19 U/L (ref 0–44)
ANION GAP: 12 (ref 5–15)
AST: 21 U/L (ref 15–41)
BILIRUBIN TOTAL: 1.4 mg/dL — AB (ref 0.3–1.2)
BUN: 10 mg/dL (ref 8–23)
CALCIUM: 9.3 mg/dL (ref 8.9–10.3)
CO2: 34 mmol/L — ABNORMAL HIGH (ref 22–32)
Chloride: 91 mmol/L — ABNORMAL LOW (ref 98–111)
Creatinine, Ser: 1.04 mg/dL — ABNORMAL HIGH (ref 0.44–1.00)
GFR calc Af Amer: >60 mL/min (ref >60)
GFR, EST NON AFRICAN AMERICAN: 56 mL/min — AB (ref >60)
GLUCOSE: 129 mg/dL — AB (ref 70–99)
POTASSIUM: 2.5 mmol/L — AB (ref 3.5–5.1)
Sodium: 137 mmol/L (ref 135–145)
TOTAL PROTEIN: 7 g/dL (ref 6.5–8.1)

## 2017-09-03 LAB — PHOSPHORUS
PHOSPHORUS: 3.8 mg/dL (ref 2.5–4.6)
Phosphorus: 4.1 mg/dL (ref 2.5–4.6)

## 2017-09-03 LAB — RAPID URINE DRUG SCREEN, HOSP PERFORMED
Amphetamines: NOT DETECTED
BARBITURATES: NOT DETECTED
BENZODIAZEPINES: NOT DETECTED
Cocaine: NOT DETECTED
Opiates: POSITIVE — AB
TETRAHYDROCANNABINOL: NOT DETECTED

## 2017-09-03 LAB — MAGNESIUM
MAGNESIUM: 1.8 mg/dL (ref 1.7–2.4)
Magnesium: 2 mg/dL (ref 1.7–2.4)

## 2017-09-03 LAB — HIV ANTIBODY (ROUTINE TESTING W REFLEX): HIV Screen 4th Generation wRfx: NONREACTIVE

## 2017-09-03 LAB — MRSA PCR SCREENING: MRSA by PCR: POSITIVE — AB

## 2017-09-03 MED ORDER — ROPINIROLE HCL 1 MG PO TABS
2.0000 mg | ORAL_TABLET | Freq: Every day | ORAL | Status: DC
  Administered 2017-09-03 – 2017-09-06 (×5): 2 mg via ORAL
  Filled 2017-09-03 (×5): qty 2

## 2017-09-03 MED ORDER — ACETAMINOPHEN 325 MG PO TABS
650.0000 mg | ORAL_TABLET | Freq: Four times a day (QID) | ORAL | Status: DC | PRN
  Administered 2017-09-07: 650 mg via ORAL
  Filled 2017-09-03 (×2): qty 2

## 2017-09-03 MED ORDER — ACETAMINOPHEN 650 MG RE SUPP: 650.0000 mg | Freq: Four times a day (QID) | RECTAL | Status: DC | PRN

## 2017-09-03 MED ORDER — METOPROLOL TARTRATE 25 MG PO TABS
25.0000 mg | ORAL_TABLET | Freq: Two times a day (BID) | ORAL | Status: DC
  Administered 2017-09-03 – 2017-09-07 (×8): 25 mg via ORAL
  Filled 2017-09-03 (×10): qty 1

## 2017-09-03 MED ORDER — DULOXETINE HCL 60 MG PO CPEP
60.0000 mg | ORAL_CAPSULE | Freq: Two times a day (BID) | ORAL | Status: DC
  Administered 2017-09-03 – 2017-09-07 (×10): 60 mg via ORAL
  Filled 2017-09-03 (×10): qty 1

## 2017-09-03 MED ORDER — INSULIN ASPART 100 UNIT/ML ~~LOC~~ SOLN
0.0000 [IU] | Freq: Three times a day (TID) | SUBCUTANEOUS | Status: DC
  Administered 2017-09-03: 7 [IU] via SUBCUTANEOUS
  Administered 2017-09-03: 2 [IU] via SUBCUTANEOUS
  Administered 2017-09-04: 1 [IU] via SUBCUTANEOUS
  Administered 2017-09-04: 2 [IU] via SUBCUTANEOUS
  Administered 2017-09-04: 1 [IU] via SUBCUTANEOUS
  Administered 2017-09-05: 2 [IU] via SUBCUTANEOUS
  Administered 2017-09-05 – 2017-09-06 (×2): 1 [IU] via SUBCUTANEOUS
  Administered 2017-09-06: 2 [IU] via SUBCUTANEOUS
  Administered 2017-09-07 (×2): 1 [IU] via SUBCUTANEOUS

## 2017-09-03 MED ORDER — SODIUM CHLORIDE 0.9% FLUSH
3.0000 mL | Freq: Two times a day (BID) | INTRAVENOUS | Status: DC
  Administered 2017-09-03 – 2017-09-07 (×3): 3 mL via INTRAVENOUS

## 2017-09-03 MED ORDER — CLINDAMYCIN PHOSPHATE 600 MG/50ML IV SOLN
600.0000 mg | Freq: Three times a day (TID) | INTRAVENOUS | Status: DC
  Administered 2017-09-03 – 2017-09-07 (×13): 600 mg via INTRAVENOUS
  Filled 2017-09-03 (×12): qty 50

## 2017-09-03 MED ORDER — SENNOSIDES-DOCUSATE SODIUM 8.6-50 MG PO TABS
2.0000 | ORAL_TABLET | Freq: Two times a day (BID) | ORAL | Status: DC
  Administered 2017-09-03 – 2017-09-07 (×9): 2 via ORAL
  Filled 2017-09-03 (×9): qty 2

## 2017-09-03 MED ORDER — FUROSEMIDE 20 MG PO TABS
20.0000 mg | ORAL_TABLET | Freq: Every day | ORAL | Status: DC
  Administered 2017-09-03 – 2017-09-07 (×5): 20 mg via ORAL
  Filled 2017-09-03 (×5): qty 1

## 2017-09-03 MED ORDER — ENOXAPARIN SODIUM 40 MG/0.4ML ~~LOC~~ SOLN
40.0000 mg | Freq: Every day | SUBCUTANEOUS | Status: DC
  Administered 2017-09-03 – 2017-09-07 (×5): 40 mg via SUBCUTANEOUS
  Filled 2017-09-03 (×5): qty 0.4

## 2017-09-03 MED ORDER — ONDANSETRON HCL 4 MG/2ML IJ SOLN
4.0000 mg | Freq: Four times a day (QID) | INTRAMUSCULAR | Status: DC | PRN
  Administered 2017-09-04 – 2017-09-06 (×6): 4 mg via INTRAVENOUS
  Filled 2017-09-03 (×6): qty 2

## 2017-09-03 MED ORDER — SODIUM CHLORIDE 0.9% FLUSH: 3.0000 mL | INTRAVENOUS | Status: DC | PRN

## 2017-09-03 MED ORDER — PANTOPRAZOLE SODIUM 40 MG PO TBEC
40.0000 mg | DELAYED_RELEASE_TABLET | Freq: Two times a day (BID) | ORAL | Status: DC
  Administered 2017-09-03 – 2017-09-07 (×10): 40 mg via ORAL
  Filled 2017-09-03 (×10): qty 1

## 2017-09-03 MED ORDER — PREDNISONE 5 MG PO TABS
10.0000 mg | ORAL_TABLET | Freq: Every day | ORAL | Status: DC
  Administered 2017-09-03 – 2017-09-07 (×5): 10 mg via ORAL
  Filled 2017-09-03 (×7): qty 2

## 2017-09-03 MED ORDER — MUPIROCIN 2 % EX OINT
1.0000 "application " | TOPICAL_OINTMENT | Freq: Two times a day (BID) | CUTANEOUS | Status: DC
  Administered 2017-09-03 – 2017-09-07 (×9): 1 via NASAL
  Filled 2017-09-03: qty 22

## 2017-09-03 MED ORDER — ONDANSETRON HCL 4 MG PO TABS
4.0000 mg | ORAL_TABLET | Freq: Four times a day (QID) | ORAL | Status: DC | PRN
  Administered 2017-09-03 – 2017-09-07 (×5): 4 mg via ORAL
  Filled 2017-09-03 (×6): qty 1

## 2017-09-03 MED ORDER — SPIRONOLACTONE 25 MG PO TABS
50.0000 mg | ORAL_TABLET | Freq: Every day | ORAL | Status: DC
  Administered 2017-09-03 – 2017-09-07 (×5): 50 mg via ORAL
  Filled 2017-09-03 (×5): qty 2

## 2017-09-03 MED ORDER — METHADONE HCL 5 MG PO TABS
5.0000 mg | ORAL_TABLET | Freq: Two times a day (BID) | ORAL | Status: DC
Start: 2017-09-03 — End: 2017-09-07
  Administered 2017-09-03 – 2017-09-07 (×10): 5 mg via ORAL
  Filled 2017-09-03 (×10): qty 1

## 2017-09-03 MED ORDER — HYDROCODONE-ACETAMINOPHEN 5-325 MG PO TABS
1.0000 | ORAL_TABLET | ORAL | Status: DC | PRN
  Administered 2017-09-03 – 2017-09-06 (×15): 2 via ORAL
  Administered 2017-09-06: 1 via ORAL
  Administered 2017-09-07 (×2): 2 via ORAL
  Filled 2017-09-03 (×16): qty 2
  Filled 2017-09-03: qty 1
  Filled 2017-09-03: qty 2

## 2017-09-03 MED ORDER — ADULT MULTIVITAMIN W/MINERALS CH
1.0000 | ORAL_TABLET | Freq: Every day | ORAL | Status: DC
  Administered 2017-09-03 – 2017-09-05 (×3): 1 via ORAL
  Filled 2017-09-03 (×5): qty 1

## 2017-09-03 MED ORDER — GABAPENTIN 400 MG PO CAPS
800.0000 mg | ORAL_CAPSULE | Freq: Three times a day (TID) | ORAL | Status: DC
  Administered 2017-09-03 – 2017-09-07 (×13): 800 mg via ORAL
  Filled 2017-09-03 (×13): qty 2

## 2017-09-03 MED ORDER — TRAZODONE HCL 50 MG PO TABS
50.0000 mg | ORAL_TABLET | Freq: Every day | ORAL | Status: DC
  Administered 2017-09-03 – 2017-09-06 (×5): 50 mg via ORAL
  Filled 2017-09-03 (×5): qty 1

## 2017-09-03 MED ORDER — SODIUM CHLORIDE 0.9 % IV SOLN
250.0000 mL | INTRAVENOUS | Status: DC | PRN
  Administered 2017-09-03 – 2017-09-06 (×3): 250 mL via INTRAVENOUS

## 2017-09-03 MED ORDER — PROMETHAZINE HCL 25 MG/ML IJ SOLN: 12.5000 mg | Freq: Four times a day (QID) | INTRAMUSCULAR | Status: DC | PRN

## 2017-09-03 MED ORDER — CHLORHEXIDINE GLUCONATE CLOTH 2 % EX PADS
6.0000 | MEDICATED_PAD | Freq: Every day | CUTANEOUS | Status: DC
  Administered 2017-09-04 – 2017-09-07 (×4): 6 via TOPICAL

## 2017-09-03 MED ORDER — INSULIN ASPART 100 UNIT/ML ~~LOC~~ SOLN: 0.0000 [IU] | Freq: Every day | SUBCUTANEOUS | Status: DC

## 2017-09-03 MED ORDER — NYSTATIN 100000 UNIT/GM EX POWD
Freq: Three times a day (TID) | CUTANEOUS | Status: DC
  Administered 2017-09-03 – 2017-09-07 (×13): via TOPICAL
  Filled 2017-09-03: qty 15

## 2017-09-03 MED ORDER — POTASSIUM CHLORIDE CRYS ER 20 MEQ PO TBCR
40.0000 meq | EXTENDED_RELEASE_TABLET | Freq: Two times a day (BID) | ORAL | Status: AC
  Administered 2017-09-03 (×2): 40 meq via ORAL
  Filled 2017-09-03 (×2): qty 2

## 2017-09-03 MED ORDER — CLONAZEPAM 0.5 MG PO TABS
0.5000 mg | ORAL_TABLET | Freq: Two times a day (BID) | ORAL | Status: DC
  Administered 2017-09-03 – 2017-09-07 (×10): 0.5 mg via ORAL
  Filled 2017-09-03 (×10): qty 1

## 2017-09-03 NOTE — ED Notes (Signed)
ED TO INPATIENT HANDOFF REPORT  Name/Age/Gender [name] Carr 62 y.o. female  Code Status Code Status History    Date Active Date Inactive Code Status Order ID Comments User Context   12/12/2014 1708 12/26/2014 2138 Full Code [id number]  Ozzie Hoyle Inpatient   11/07/2014 0003 12/12/2014 1523 Full Code [id number]  Corey Harold Inpatient   05/23/2014 2057 05/29/2014 1413 Full Code [id number]  Meredith Pel, MD Inpatient   05/23/2014 0529 05/23/2014 2057 Full Code [id number]  Deneise Lever, MD ED   05/09/2014 1947 05/14/2014 1911 Full Code [id number]  Meredith Pel, MD Inpatient   05/08/2014 2259 05/09/2014 1947 Full Code [id number]  Rise Patience, MD Inpatient   12/21/2013 0407 12/29/2013 1829 Full Code [id number]  Berle Mull, MD Inpatient   08/30/2013 1220 12/21/2013 0407 Full Code [id number]  Hennie Duos, MD Outpatient   08/22/2013 0454 08/27/2013 2125 Full Code [id number]  Theressa Millard, MD Inpatient   11/08/2012 2321 11/21/2012 1705 Full Code [id number]  [place], Delphine Inpatient   08/30/2012 1816 09/19/2012 2019 Full Code [id number]  Ladoris Gene Inpatient   01/11/2012 0308 01/12/2012 2147 Full Code [id number]  Derenda Fennel, RN Inpatient   12/17/2011 0105 12/18/2011 1739 Full Code [id number]  Theressa Millard, MD ED   02/04/2011 2115 02/07/2011 2025 Full Code [id number]  Herold Harms Inpatient   12/20/2010 0149 01/03/2011 1420 Full Code [id number]  Beryl Meager, RN Inpatient      Home/SNF/Other Home  Chief Complaint Leg Pain; Lower Back Pain  Level of Care/Admitting Diagnosis ED Disposition    ED Disposition [facility]: [facility] [[id number]]  Level of Care: Med-Surg [16]  Diagnosis: Cellulitis [073710]  Admitting Physician: Toy Baker [[id number]]  Attending Physician: Toy Baker [[id number]]  Estimated length of stay: 3 - 4 days  Certification:: I certify this patient will need inpatient services  for at least 2 midnights  PT Class (Do Not Modify): Inpatient [101]  PT Acc Code (Do Not Modify): Private [1]       Medical History Past Medical History:  Diagnosis Date  . Abnormal brain MRI   . Addison's disease ([place])   . ALLERGIC RHINITIS   . ANEMIA-IRON DEFICIENCY   . Anxiety   . At risk for falls   . Bacteremia   . Blood clot in vein 2011; 02/04/11   "right jugular vein;left jugular vein"  . CARCINOMA, BREAST 08/2009 dx   R breast,  mets to liver - resolution s/p chemo, s/p B mastect  . Cephalgia   . Chronic constipation   . Chronic migraine w/o aura w/o status migrainosus, not intractable   . Chronic pain syndrome    chronic narcotics, dependancy hx - [place] for pain mgmt  . DDD (degenerative disc disease)   . Degenerative joint disease   . DEPRESSION   . DIABETES MELLITUS, TYPE II   . Disease of thyroid gland   . Fibromyalgia   . GERD   . HYPERLIPIDEMIA   . Hypertension   . Hypothyroidism pt states she has never had hy pothyroidism  . Leukocytosis   . Lumbar degenerative disc disease   . Lumbar radiculopathy   . Lupus erythematosus   . Lyme disease   . 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 MIGRAINE HEADACHE   . MRSA (methicillin resistant staph aureus) culture positive   . Munchausen syndrome   . Myoclonus   . Narcotic abuse ([place])   .  Neuropathy    hands, feet, due to diabetes & back problem  . Opioid dependence ([place])   . Peripheral neuropathy   . PERSONALITY DISORDER   . Phlebitis after infusion    left ?calf; "S/P phenergan/demerol injection"  . Psoriasis   . RA (rheumatoid arthritis) ([place])   . SEIZURE DISORDER    "lots of seizures; due to lupus"  . SLE (systemic lupus erythematosus) (HCC)    rheum at [place] ([place]) - chronic pred  . Sleeping difficulties   . Small vessel disease ([place])   . Speech disturbance   . Surgical wound infection 02/15/2011   R shoulder and mastectomy site - related to PICC  . Therapeutic drug monitoring      Allergies Allergies  Allergen Reactions  . Ammonia Other (See Comments) and Shortness Of Breath    Other reaction(s): ASTHMA Ended up on ventilator from ammonia tabs  . Bee Venom Anaphylaxis  . Contrast Media [Iodinated Diagnostic Agents] Other (See Comments)    Other reaction(s): DIFFICULTY BREATHING Doesn't breath well.  . Infliximab Nausea And Vomiting and Swelling    Other reaction(s): ANAPHYLAXIS  . Iohexol Other (See Comments)    SOB   . Methotrexate Anaphylaxis and Swelling  . Midazolam Hcl Anaphylaxis  . Nalbuphine Other (See Comments) and Anaphylaxis    Can't breathe well Other reaction(s): ANAPHYLAXIS  . Pentazocine Anaphylaxis  . Hydroxychloroquine   . Ketorolac Tromethamine Other (See Comments)    Injectable doesn't work and pill hurts stomach.  . Morphine And Related Hives  . Nsaids Nausea And Vomiting  . Penicillins     Has patient had a PCN reaction causing immediate rash, facial/tongue/throat swelling, SOB or lightheadedness with hypotension: No Has patient had a PCN reaction causing severe rash involving mucus membranes or skin necrosis: No Has patient had a PCN reaction that required hospitalization: No Has patient had a PCN reaction occurring within the last 10 years: No If all of the above answers are "NO", then may proceed with Cephalosporin use.  Tolerates cephalosporins  . Rofecoxib   . Tetracyclines & Related     Other reaction(s): ANAPHYLAXIS  . Celecoxib Rash    Other reaction(s): GI Upset (intolerance)  . Midazolam Anxiety    Other reaction(s): CAUSES ANXIETY    IV Location/Drains/Wounds Patient Lines/Drains/Airways Status   Active Line/Drains/Airways    Name:   Placement date:   Placement time:   Site:   Days:   Negative Pressure Wound Therapy Thigh Left;Anterior   05/23/14    1954    -   1199   Incision (Closed) 12/22/13 Thigh Left   12/22/13    0857     1351   Incision (Closed) 05/09/14 Thigh Left   05/09/14    1743     1213    Incision (Closed) 05/23/14 Thigh Left   05/23/14    2009     1199          Labs/Imaging Results for orders placed or performed during the hospital encounter of 09/02/17 (from the past 48 hour(s))  CBG monitoring, ED     Status: Abnormal   Collection Time: 09/02/17  3:41 PM  Result Value Ref Range   Glucose-Capillary 137 (H) 70 - 99 mg/dL  Lactic acid, plasma     Status: None   Collection Time: 09/02/17  4:45 PM  Result Value Ref Range   Lactic Acid, Venous 1.5 0.5 - 1.9 mmol/L    Comment: Performed at [facility],  [place] [address], [place], [place] [address]  CBC with Differential     Status: None   Collection Time: 09/02/17  6:15 PM  Result Value Ref Range   WBC 9.0 4.0 - 10.5 K/uL   RBC 4.17 3.87 - 5.11 MIL/uL   Hemoglobin 12.3 12.0 - 15.0 g/dL   HCT 38.1 36.0 - 46.0 %   MCV 91.4 78.0 - 100.0 fL   MCH 29.5 26.0 - 34.0 pg   MCHC 32.3 30.0 - 36.0 g/dL   RDW 14.6 11.5 - 15.5 %   Platelets 238 150 - 400 K/uL   Neutrophils Relative % 76 %   Neutro Abs 6.8 1.7 - 7.7 K/uL   Lymphocytes Relative 12 %   Lymphs Abs 1.0 0.7 - 4.0 K/uL   Monocytes Relative 11 %   Monocytes Absolute 1.0 0.1 - 1.0 K/uL   Eosinophils Relative 1 %   Eosinophils Absolute 0.1 0.0 - 0.7 K/uL   Basophils Relative 0 %   Basophils Absolute 0.0 0.0 - 0.1 K/uL    Comment: Performed at [facility], [place] [address], [place], [place] [address]  Comprehensive metabolic panel     Status: Abnormal   Collection Time: 09/02/17  6:15 PM  Result Value Ref Range   Sodium 133 (L) 135 - 145 mmol/L   Potassium 3.2 (L) 3.5 - 5.1 mmol/L   Chloride 88 (L) 98 - 111 mmol/L   CO2 31 22 - 32 mmol/L   Glucose, Bld 145 (H) 70 - 99 mg/dL   BUN 10 8 - 23 mg/dL   Creatinine, Ser 1.11 (H) 0.44 - 1.00 mg/dL   Calcium 8.9 8.9 - 10.3 mg/dL   Total Protein 7.6 6.5 - 8.1 g/dL   Albumin 4.1 3.5 - 5.0 g/dL   AST 41 15 - 41 U/L   ALT 18 0 - 44 U/L   Alkaline Phosphatase 74 38 - 126 U/L   Total  Bilirubin 2.1 (H) 0.3 - 1.2 mg/dL   GFR calc non Af Amer 52 (L) >60 mL/min   GFR calc Af Amer >60 >60 mL/min    Comment: (NOTE) The eGFR has been calculated using the CKD EPI equation. This calculation has not been validated in all clinical situations. eGFR's persistently <60 mL/min signify possible Chronic Kidney Disease.    Anion gap 14 5 - 15    Comment: Performed at [facility], [place] [address], [place], [place] [address]  Magnesium     Status: None   Collection Time: 09/02/17  6:15 PM  Result Value Ref Range   Magnesium 1.8 1.7 - 2.4 mg/dL    Comment: Performed at [facility], [place] [address], [place], [place] [address]  Phosphorus     Status: None   Collection Time: 09/02/17  6:15 PM  Result Value Ref Range   Phosphorus 4.1 2.5 - 4.6 mg/dL    Comment: Performed at [facility], [place] [address], [place], [place] [address]   No results found.  Pending Labs Unresulted Labs (From admission, onward)   Start     Ordered   09/02/17 2314  Urine rapid drug screen (hosp performed)  STAT,   R     09/02/17 2313   09/02/17 2205  Brain natriuretic peptide  Once,   R     09/02/17 2204   09/02/17 1645  Lactic acid, plasma  Now then every 2 hours,   STAT     09/02/17 1645  Signed and Held  HIV antibody (Routine Testing)  Tomorrow morning,   R     Signed and Held   Signed and Held  Magnesium  Tomorrow morning,   R    Comments:  Call MD if <1.5    Signed and Held   Signed and Held  Phosphorus  Tomorrow morning,   R     Signed and Held   Signed and Held  TSH  Once,   R    Comments:  Cancel if already done within 1 month and notify MD    Signed and Held   Signed and Held  Comprehensive metabolic panel  Once,   R    Comments:  Cal MD for K<3.5 or >5.0    Signed and Held   Signed and Held  CBC  Once,   R    Comments:  Call for hg <8.0    Signed and Held      Vitals/Pain Today's Vitals   09/02/17 1549 09/02/17 2214  09/02/17 2215 09/02/17 2215  BP:    (!) 148/82  Pulse:    94  Resp:    18  SpO2:    96%  Weight:   183 lb (83 kg)   Height:   _0  (1.626 m)   PainSc: 5  8       Isolation Precautions No active isolations  Medications Medications  potassium chloride SA (K-DUR,KLOR-CON) CR tablet 40 mEq (has no administration in time range)  oxyCODONE-acetaminophen (PERCOCET/ROXICET) 5-325 MG per tablet 1 tablet (1 tablet Oral Given 09/02/17 1739)  ondansetron (ZOFRAN-ODT) disintegrating tablet 4 mg (4 mg Oral Given 09/02/17 1739)  sulfamethoxazole-trimethoprim (BACTRIM DS,SEPTRA DS) 800-160 MG per tablet 1 tablet (1 tablet Oral Given 09/02/17 1739)  lactated ringers bolus 1,000 mL (0 mLs Intravenous Stopped 09/02/17 2327)  clindamycin (CLEOCIN) IVPB 600 mg (0 mg Intravenous Stopped 09/02/17 2327)    Mobility walks with device

## 2017-09-03 NOTE — Progress Notes (Signed)
Patient ID: [name] Carr, female   DOB: 07/01/1955, 62 y.o.   MRN: [id number]                                                                PROGRESS NOTE                                                                                                                                                                                                             Patient Demographics:    [name] Carr, is a 62 y.o. female, DOB - 01-Jul-1955, HCW:[id number]  Admit date - 09/02/2017   Admitting Physician Toy Baker, MD  Outpatient Primary MD for the patient is [name], MD  LOS - 1  Outpatient Specialists:     Chief Complaint  Patient presents with  . Wound Check       Brief Narrative     62 y.o. 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  Worse from baseline she has chronic sores bilaterally.  REports  fevers or chills.  Patient requesting admission for IV antibiotics because she states she cannot tolerate p.o. at home of note patient has complex medical history secondary to chronic cellulitis with medical noncompliance and refusal to take oral antibiotics with history of prescription drug abuse and polypharmacy Has been evaluated at [facility] for cellulitis on 1 July for the same at that time was discharged home from ER Last time patient was admitted to our system in 2016 there was some possibility of PICC line abuse.  Patient has refused skilled nursing placement or outpatient physical therapy Prednisone 10 mg a day for history of lupus. Last admission to [facility] was on 3 June secondary to cellulitis and dehydration ID was consulted recommended discharge on  clindamycin 450 mg p.o. 3 times a day for 10 days blood cultures showed no growth recommended SNF at that time refused refused outpatient physical therapy as well        Subjective:    [name] Carr today has been afebrile.  Redness of legs still present,  Still with slight warmth.    No headache, No chest pain, No abdominal pain - No Nausea, No new weakness tingling or numbness, No Cough - SOB.    Assessment  & Plan :    Active Problems:   Chronic pain syndrome   Cellulitis   Hyponatremia   Lower extremity cellulitis   Hypokalemia   Hypertension   Diabetes mellitus type 2, controlled ([place])   Drug-seeking behavior   Lupus erythematosus   Opioid dependence ([place])   Wounds, multiple open, lower extremity   Addison's disease ([place])   62 y.o. 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  Admitted for likely lower extremity cellulitis in the setting of multiple wounds  Cellulitis, recurrent H/o MRSA, h/o drug seeking behavior and Picc line abuse Cont Clindamycin iv  Multiple wounds Wound care Wound care consult, appreciate input  Chronic pain syndrome Cont Gabapentin 800mg  po tid Cont methadone Percocet 5/325mg  po q6h prn  pain  Opioid dep Avoid IV narcotics per admitting physician  Hypertension Cont Spironolactone 50mg  po qday Cont Lasix 20mg  po qday Cont metoprolol 25mg  po bid   Lupus Cont prednisone 10mg  po qday  Addison disease Cont Prednisone as above  Hypokalemia Replete Check cmp in am  Dm2 (Hga1c= fsbs ac and qhs, ISS  H/o depression/ anxiety Cont Cymbalta 60mg  po bid Cont clonazepam 0.5mg  po bid  RLS Cont Ropinirole  Gerd Cont PPI  DVT prophylaxis:  Lovenox     Code Status:   FULL CODE     Family Communication:   Family not  at  Bedside    Disposition Plan:   PT/OT to evaluate, TBD                                       Consults called: none   Admission status:   inpatient       Level of care      medical floor               Lab Results  Component Value Date   PLT 227 09/03/2017    Antibiotics  :  clinda 7/28=>  Anti-infectives (From admission, onward)   Start     Dose/Rate Route Frequency Ordered Stop   09/03/17 0600  clindamycin (CLEOCIN) IVPB 600 mg     600 mg 100 mL/hr over 30 Minutes Intravenous Every 8 hours 09/03/17 0153     09/02/17 2215  clindamycin (CLEOCIN) IVPB 600 mg     600 mg 100 mL/hr over 30 Minutes Intravenous  Once 09/02/17 2201 09/02/17 2327   09/02/17 1700  sulfamethoxazole-trimethoprim (BACTRIM DS,SEPTRA DS) 800-160 MG per tablet 1 tablet     1 tablet Oral  Once 09/02/17 1645 09/02/17 1739   09/02/17 1645  sulfamethoxazole-trimethoprim (BACTRIM DS,SEPTRA DS) 800-160 MG per tablet 1 tablet  Status:  Discontinued     1 tablet Oral  Once 09/02/17 1643 09/02/17 1645        Objective:   Vitals:   09/02/17 2215 09/03/17 0130 09/03/17 0209 09/03/17 0509  BP: (!) 148/82 (!) 155/70 (!) 165/66 (!) 142/68  Pulse: 94 100 83 80  Resp: 18  17 18 16   Temp:  98.2 F (36.8 C) 99.4 F (37.4 C) 99.2 F (37.3 C)  TempSrc:  Oral Oral Oral  SpO2: 96% 97% 93% 97%  Weight:   83 kg (182 lb 15.7 oz)   Height:   5\' 4"  (1.626 m)     Wt Readings from Last 3 Encounters:  09/03/17 83 kg (182 lb 15.7 oz)  08/23/17 83.1 kg (183 lb 1.9 oz)  07/27/16 85.3 kg (188 lb)    No intake or output data in the 24 hours ending 09/03/17 0759   Physical Exam  Awake Alert, Oriented X 3, No new F.N deficits, Normal affect [place].AT,PERRAL Supple Neck,No JVD, No cervical lymphadenopathy appriciated.  Symmetrical Chest wall movement, Good air movement bilaterally, CTAB RRR,No Gallops,Rubs or new Murmurs, No Parasternal Heave +ve B.Sounds, Abd  Soft, No tenderness, No organomegaly appriciated, No rebound - guarding or rigidity. No Cyanosis, Clubbing or edema,  Multiple wounds, redness up to the knee bilateral lower ext   Data Review:    CBC Recent Labs  Lab 09/02/17 1815 09/03/17 0445  WBC 9.0 6.7  HGB 12.3 11.8*  HCT 38.1 37.3  PLT 238 227  MCV 91.4 90.3  MCH 29.5 28.6  MCHC 32.3 31.6  RDW 14.6 14.6  LYMPHSABS 1.0  --   MONOABS 1.0  --   EOSABS 0.1  --   BASOSABS 0.0  --     Chemistries  Recent Labs  Lab 09/02/17 1815 09/03/17 0445  NA 133* 137  K 3.2* 2.5*  CL 88* 91*  CO2 31 34*  GLUCOSE 145* 129*  BUN 10 10  CREATININE 1.11* 1.04*  CALCIUM 8.9 9.3  MG 1.8 2.0  AST 41 21  ALT 18 19  ALKPHOS 74 69  BILITOT 2.1* 1.4*   ------------------------------------------------------------------------------------------------------------------ No results for input(s): CHOL, HDL, LDLCALC, TRIG, CHOLHDL, LDLDIRECT in the last 72 hours.  Lab Results  Component Value Date   HGBA1C 6.5 (H) 05/23/2014   ------------------------------------------------------------------------------------------------------------------ Recent Labs    09/03/17 0445  TSH 2.765   ------------------------------------------------------------------------------------------------------------------ No results for input(s): VITAMINB12, FOLATE, FERRITIN, TIBC, IRON, RETICCTPCT in the last 72 hours.  Coagulation profile No results for input(s): INR, PROTIME in the last 168 hours.  No results for input(s): DDIMER in the last 72 hours.  Cardiac Enzymes No results for input(s): CKMB, TROPONINI, MYOGLOBIN in the last 168 hours.  Invalid input(s): CK ------------------------------------------------------------------------------------------------------------------    Component Value Date/Time   BNP 73.0 09/02/2017 2205    Inpatient Medications  Scheduled Meds: . clonazePAM  0.5 mg Oral BID  . DULoxetine  60 mg Oral BID  .  enoxaparin (LOVENOX) injection  40 mg Subcutaneous Daily  . furosemide  20 mg Oral Daily  . gabapentin  800 mg Oral TID  . insulin aspart  0-5 Units Subcutaneous QHS  . insulin aspart  0-9 Units Subcutaneous TID WC  . methadone  5 mg Oral BID  . metoprolol tartrate  25 mg Oral BID  . multivitamin with minerals  1 tablet Oral Daily  . nystatin   Topical TID  . pantoprazole  40 mg Oral BID  . potassium chloride  40 mEq Oral BID  . predniSONE  10 mg Oral Q breakfast  . rOPINIRole  2 mg Oral QHS  . senna-docusate  2 tablet Oral BID  . sodium chloride flush  3 mL Intravenous Q12H  . spironolactone  50 mg Oral Daily  . traZODone  50 mg Oral QHS   Continuous  Infusions: . sodium chloride 250 mL (09/03/17 0529)  . clindamycin (CLEOCIN) IV Stopped (09/03/17 0602)   PRN Meds:.sodium chloride, acetaminophen **OR** acetaminophen, HYDROcodone-acetaminophen, ondansetron **OR** ondansetron (ZOFRAN) IV, sodium chloride flush  Micro Results No results found for this or any previous visit (from the past 240 hour(s)).  Radiology Reports Ct Chest Wo Contrast  Result Date: 08/24/2017 CLINICAL DATA:  Follow-up left lung nodule. History of breast cancer. EXAM: CT CHEST WITHOUT CONTRAST TECHNIQUE: Multidetector CT imaging of the chest was performed following the standard protocol without IV contrast. COMPARISON:  07/27/2016 FINDINGS: Cardiovascular: The heart size appears within normal limits. Aortic atherosclerosis identified. Calcification in the LAD coronary artery noted. Mediastinum/Nodes: The trachea appears patent and is midline. Normal appearance of the thyroid gland. Normal appearance of the esophagus. No supraclavicular or axillary adenopathy. No mediastinal or hilar adenopathy. Lungs/Pleura: No pleural effusion, airspace consolidation, or atelectasis. Well-circumscribed solid nodule in the right lower lobe measures 1 cm, image 76/3. unchanged from comparison exam. No new or enlarging pulmonary nodules  or masses identified. Upper Abdomen: No acute abnormality. Musculoskeletal: Mild scoliosis and spondylosis noted. Chronic right posterior tenth rib fracture. Healed fracture involving the proximal body of sternum. IMPRESSION: 1. Stable right lower lobe solid nodule compared with 07/27/2016. Given perceived increase in size from abdominal CT from 2016 continued interval follow-up is recommended in order to establish 24 months of stability (from 07/27/2016). 2. Aortic atherosclerosis and LAD coronary artery atherosclerotic calcifications. Electronically Signed   By: Kerby Moors M.D.   On: 08/24/2017 10:14    Time Spent in minutes  30   Jani Gravel M.D on 09/03/2017 at 7:59 AM  Between 7am to 7pm - Pager - [phone]    After 7pm go to [email or web address] - password [facility]  [facility] -  Office  [phone]

## 2017-09-03 NOTE — Progress Notes (Signed)
CRITICAL VALUE ALERT  Critical Value:  Potassium 2.5  Date & Time Notified: 7/28 0557  Provider Notified: Kennon Holter NP  Orders Received/Actions taken:

## 2017-09-04 ENCOUNTER — Encounter (HOSPITAL_COMMUNITY): Payer: Self-pay

## 2017-09-04 DIAGNOSIS — E271 Primary adrenocortical insufficiency: Secondary | ICD-10-CM

## 2017-09-04 LAB — GLUCOSE, CAPILLARY
GLUCOSE-CAPILLARY: 132 mg/dL — AB (ref 70–99)
Glucose-Capillary: 160 mg/dL — ABNORMAL HIGH (ref 70–99)
Glucose-Capillary: 147 mg/dL — ABNORMAL HIGH (ref 70–99)
Glucose-Capillary: 129 mg/dL — ABNORMAL HIGH (ref 70–99)

## 2017-09-04 LAB — COMPREHENSIVE METABOLIC PANEL
ALT: 18 U/L (ref 0–44)
AST: 21 U/L (ref 15–41)
Albumin: 3.2 g/dL — ABNORMAL LOW (ref 3.5–5.0)
Alkaline Phosphatase: 57 U/L (ref 38–126)
Anion gap: 11 (ref 5–15)
BUN: 14 mg/dL (ref 8–23)
CALCIUM: 8.8 mg/dL — AB (ref 8.9–10.3)
CHLORIDE: 96 mmol/L — AB (ref 98–111)
CO2: 30 mmol/L (ref 22–32)
CREATININE: 1.13 mg/dL — AB (ref 0.44–1.00)
GFR, EST AFRICAN AMERICAN: 59 mL/min — AB (ref >60)
GFR, EST NON AFRICAN AMERICAN: 51 mL/min — AB (ref >60)
Glucose, Bld: 150 mg/dL — ABNORMAL HIGH (ref 70–99)
Potassium: 3 mmol/L — ABNORMAL LOW (ref 3.5–5.1)
Sodium: 137 mmol/L (ref 135–145)
TOTAL PROTEIN: 6.2 g/dL — AB (ref 6.5–8.1)
Total Bilirubin: 0.7 mg/dL (ref 0.3–1.2)

## 2017-09-04 LAB — CBC
HCT: 35 % — ABNORMAL LOW (ref 36.0–46.0)
Hemoglobin: 11 g/dL — ABNORMAL LOW (ref 12.0–15.0)
MCH: 28.9 pg (ref 26.0–34.0)
MCHC: 31.4 g/dL (ref 30.0–36.0)
MCV: 91.9 fL (ref 78.0–100.0)
PLATELETS: 210 10*3/uL (ref 150–400)
RBC: 3.81 MIL/uL — ABNORMAL LOW (ref 3.87–5.11)
RDW: 14.9 % (ref 11.5–15.5)
WBC: 5.6 10*3/uL (ref 4.0–10.5)

## 2017-09-04 MED ORDER — POTASSIUM CHLORIDE 10 MEQ/100ML IV SOLN
INTRAVENOUS | Status: AC
Start: 2017-09-04 — End: 2017-09-04
  Filled 2017-09-04: qty 100

## 2017-09-04 MED ORDER — POTASSIUM CHLORIDE 10 MEQ/100ML IV SOLN
10.0000 meq | INTRAVENOUS | Status: AC
  Administered 2017-09-04 (×3): 10 meq via INTRAVENOUS
  Filled 2017-09-04 (×2): qty 100

## 2017-09-04 MED ORDER — POTASSIUM CHLORIDE CRYS ER 20 MEQ PO TBCR
30.0000 meq | EXTENDED_RELEASE_TABLET | Freq: Once | ORAL | Status: AC
  Administered 2017-09-04: 30 meq via ORAL
  Filled 2017-09-04: qty 1

## 2017-09-04 NOTE — Evaluation (Signed)
Physical Therapy Evaluation Patient Details Name: [name] Carr MRN: [id number] DOB: 03-30-1955 Today's Date: 09/04/2017   History of Present Illness  62yo female presenting with redness of B LEs and requesting antibiotics. She has a recent history of admissions to [facility] on 07/10/17 and to [facility] on 08/07/17 for cellulitus. PMH Addisons, anxiety, hx DVTs, hx metastatic breast CA, chronic pain syndrome, DDD, DJD, DM, fibromyalgia, HTN, hypothyroidism, lupus, Lyme disease, Munchausen syndrome, narcotics abuse, neuropathy, opiod dependency, personality disorder, RA, hx seizures, PICC and history of central line manipulation   Clinical Impression   Patient received in bed, very pleasant and willing to participate in skilled PT services this morning. She is able to complete functional bed mobility with S, but does require Min guard for functional transfers and gait with RW, excessive use of B UEs noted likely due to LE and wound pain during gait. SpO2 remained between 97-100% with activity today. She was left up in the chair with chair alarm activated, nurse tech present, and all other needs met this morning. She will continue to benefit from skilled PT services in the acute setting as well as ST-SNF to further address functional deficits and reduce fall risk (7-8 falls at home within the past several months) moving forward.     Follow Up Recommendations SNF    Equipment Recommendations  Other (comment)(defer to next venue )    Recommendations for Other Services       Precautions / Restrictions Precautions Precautions: Fall Precaution Comments: multiple LE wounds including on toe  Restrictions Weight Bearing Restrictions: No      Mobility  Bed Mobility Overal bed mobility: Needs Assistance Bed Mobility: Supine to Sit     Supine to sit: Supervision     General bed mobility comments: S for safety   Transfers Overall transfer level: Needs assistance Equipment used:  Rolling walker (2 wheeled) Transfers: Sit to/from Stand Sit to Stand: Min guard         General transfer comment: Min guard for safety, cues for hand placement   Ambulation/Gait Ambulation/Gait assistance: Min guard Gait Distance (Feet): 80 Feet Assistive device: Rolling walker (2 wheeled) Gait Pattern/deviations: Step-through pattern;Decreased step length - left;Decreased stance time - right;Decreased stride length;Decreased dorsiflexion - left;Decreased dorsiflexion - right;Drifts right/left;Trunk flexed     General Gait Details: gait limited by fatigue, back pain, pain in LEs; one standing rest break, SpO2 remains 97-100% following mobility   Stairs            Wheelchair Mobility    Modified Rankin (Stroke Patients Only)       Balance Overall balance assessment: History of Falls;Needs assistance Sitting-balance support: Bilateral upper extremity supported;Feet supported Sitting balance-Leahy Scale: Good     Standing balance support: Bilateral upper extremity supported;During functional activity Standing balance-Leahy Scale: Fair                               Pertinent Vitals/Pain Pain Assessment: 0-10 Pain Score: 6  Pain Location: wound locations  Pain Descriptors / Indicators: Aching;Sore Pain Intervention(s): Limited activity within patient's tolerance;Monitored during session    [place] expects to be discharged to:: Private residence Living Arrangements: Alone Available Help at Discharge: Family;Friend(s);Available PRN/intermittently Type of Home: House Home Access: Ramped entrance     Home Layout: One level Home Equipment: Walker - 2 wheels;Walker - 4 wheels;Cane - single point;Bedside commode;Shower seat;Electric scooter;Wheelchair - manual  Prior Function Level of Independence: Needs assistance   Gait / Transfers Assistance Needed: cane or RW for mobility            Hand Dominance         Extremity/Trunk Assessment        Lower Extremity Assessment Lower Extremity Assessment: Generalized weakness    Cervical / Trunk Assessment Cervical / Trunk Assessment: Kyphotic  Communication   Communication: No difficulties  Cognition Arousal/Alertness: Awake/alert Behavior During Therapy: WFL for tasks assessed/performed Overall Cognitive Status: Within Functional Limits for tasks assessed                                        General Comments      Exercises     Assessment/Plan    PT Assessment Patient needs continued PT services  PT Problem List Decreased strength;Decreased mobility;Decreased coordination;Decreased activity tolerance;Decreased balance;Pain;Impaired sensation       PT Treatment Interventions DME instruction;Therapeutic activities;Gait training;Therapeutic exercise;Patient/family education;Stair training;Balance training;Functional mobility training;Neuromuscular re-education;Manual techniques    PT Goals (Current goals can be found in the Care Plan section)  Acute Rehab PT Goals Patient Stated Goal: to get stronger and stop falling  PT Goal Formulation: With patient Time For Goal Achievement: 09/18/17 Potential to Achieve Goals: Good    Frequency Min 2X/week   Barriers to discharge        Co-evaluation               AM-PAC PT "6 Clicks" Daily Activity  Outcome Measure Difficulty turning over in bed (including adjusting bedclothes, sheets and blankets)?: None Difficulty moving from lying on back to sitting on the side of the bed? : None Difficulty sitting down on and standing up from a chair with arms (e.g., wheelchair, bedside commode, etc,.)?: A Little Help needed moving to and from a bed to chair (including a wheelchair)?: A Little Help needed walking in hospital room?: A Little Help needed climbing 3-5 steps with a railing? : A Lot 6 Click Score: 19    End of Session Equipment Utilized During Treatment: Gait  belt Activity Tolerance: Patient tolerated treatment well Patient left: in chair;with call bell/phone within reach;with chair alarm set;with nursing/sitter in room   PT Visit Diagnosis: Unsteadiness on feet (R26.81);Muscle weakness (generalized) (M62.81);Difficulty in walking, not elsewhere classified (R26.2);History of falling (Z91.81)    Time: 1140-1210 PT Time Calculation (min) (ACUTE ONLY): 30 min   Charges:   PT Evaluation $PT Eval Moderate Complexity: 1 Mod PT Treatments $Gait Training: 8-22 mins        Deniece Ree PT, DPT, CBIS  Supplemental Physical Therapist [place]   Pager [phone]

## 2017-09-04 NOTE — Progress Notes (Addendum)
Patient ID: LAYNEE LOCKAMY, female   DOB: 13-Feb-1955, 62 y.o.   MRN: [id number]                                                                PROGRESS NOTE                                                                                                                                                                                                             Patient Demographics:    [name] Carr, is a 62 y.o. female, DOB - June 01, 1955, CLE:[id number]  Admit date - 09/02/2017   Admitting Physician Toy Baker, MD  Outpatient Primary MD for the patient is [name], MD  LOS - 2  Outpatient Specialists:    Chief Complaint  Patient presents with  . 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 Worse from baseline she has chronic sores bilaterally.REportsfevers or chills. Patient requesting admission for IV antibiotics because she states she cannot tolerate p.o. at home of note patient has complex medical history secondary to chronic cellulitis with medical noncompliance and refusal to take oral antibiotics with history of prescription drug abuse and polypharmacy Has been evaluated at [facility] for cellulitis on 1 July for the same at that time was discharged home from ER Last time patient was admitted to our system in 2016 there was some possibility of PICC line abuse. Patient has refused skilled nursing placement or outpatient physical therapy Prednisone 10 mg a day for history of lupus. Last admission to [facility] was on 3 June secondary to cellulitis and dehydration ID was consulted recommended discharge on  clindamycin 450 mg p.o. 3 times a day for 10 days blood cultures showed no growth recommended SNF at that time refused refused outpatient physical therapy as well         Subjective:    [name] Carr today still has redness of the bilateral distal lower ext,  Has not improved much on the iv clindamycin  Afebrile.  No headache, No chest pain, No abdominal pain - No Nausea, No new weakness tingling or numbness, No Cough - SOB.    Assessment  & Plan :    Active Problems:   Chronic pain syndrome   Cellulitis   Hyponatremia   Lower extremity cellulitis   Hypokalemia   Hypertension   Diabetes mellitus type 2, controlled ([place])   Drug-seeking behavior   Lupus erythematosus   Opioid dependence ([place])   Wounds, multiple open, lower extremity   Addison's disease ([place])    62 y.o.femalewith medical history significant of metastatic breast cancer in remission right lower lobe pulmonary nodule chronic cellulitis of her legs, SLE on chronic steroidsfollowedwith [place],history of MRSA ,DM 2,recurrent thigh abscess since 2015wound cultures that grew out mycobacterium fortuitumAdmitted for likely lower extremity cellulitis in the setting of multiple wounds  Cellulitis, recurrent H/o MRSA, h/o drug seeking behavior and Picc line abuse Cont Clindamycin iv Check cbc in am  Multiple wounds Wound care Wound care consult, appreciate input  Hypokalemia Replete Check cmp in am  Chronic pain syndrome Cont Gabapentin 800mg  po tid Cont methadone Percocet 5/325mg  po q6h prn  pain  Opioid dep Avoid IV narcotics per admitting physician  Hypertension Cont Spironolactone 50mg  po qday Cont Lasix 20mg  po qday Cont metoprolol 25mg  po bid   Lupus Cont prednisone 10mg  po qday  Addison disease Cont Prednisone as above  Hypokalemia Replete Check cmp in am  Dm2 fsbs ac and qhs, ISS  H/o depression/ anxiety Cont Cymbalta 60mg  po bid Cont clonazepam 0.5mg  po  bid  RLS Cont Ropinirole  Gerd Cont PPI     Code Status :  FULL CODE  Family Communication  : w patient  Disposition Plan  : home  Barriers For Discharge :   Consults  :  none  Procedures  :   DVT Prophylaxis  :  Lovenox - - SCDs  Lab Results  Component Value Date   PLT 210 09/04/2017        Antibiotics  :  clinda 7/28=>     Anti-infectives (From admission, onward)   Start     Dose/Rate Route Frequency Ordered Stop   09/03/17 0600  clindamycin (CLEOCIN) IVPB 600 mg     600 mg 100 mL/hr over 30 Minutes Intravenous Every 8 hours 09/03/17 0153     09/02/17 2215  clindamycin (CLEOCIN) IVPB 600 mg     600 mg 100 mL/hr over 30 Minutes Intravenous  Once 09/02/17 2201 09/02/17 2327   09/02/17 1700  sulfamethoxazole-trimethoprim (BACTRIM DS,SEPTRA DS) 800-160 MG per tablet 1 tablet     1 tablet Oral  Once 09/02/17 1645 09/02/17 1739   09/02/17 1645  sulfamethoxazole-trimethoprim (BACTRIM DS,SEPTRA DS) 800-160 MG per tablet 1 tablet  Status:  Discontinued     1 tablet Oral  Once 09/02/17 1643 09/02/17 1645        Objective:   Vitals:   09/03/17 0209 09/03/17 0509 09/03/17 1336 09/03/17 2057  BP: (!) 165/66 (!) 142/68 133/63 (!) 88/76  Pulse: 83 80 74 76  Resp: 18 16 18 16   Temp: 99.4 F (37.4 C) 99.2 F (37.3 C) 98.8 F (37.1 C) 99.1 F (37.3 C)  TempSrc: Oral Oral Oral Oral  SpO2: 93% 97% 94% 95%  Weight: 83 kg (182 lb 15.7 oz)     Height: 5\' 4"  (1.626 m)       Wt Readings from Last 3 Encounters:  09/03/17 83 kg (182 lb 15.7 oz)  08/23/17  83.1 kg (183 lb 1.9 oz)  07/27/16 85.3 kg (188 lb)     Intake/Output Summary (Last 24 hours) at 09/04/2017 0648 Last data filed at 09/03/2017 2026 Gross per 24 hour  Intake 687.5 ml  Output 1776 ml  Net -1088.5 ml     Physical Exam  Awake Alert, Oriented X 3, No new F.N deficits, Normal affect [name] Carr.AT,PERRAL Supple Neck,No JVD, No cervical lymphadenopathy appriciated.  Symmetrical Chest wall movement,  Good air movement bilaterally, CTAB RRR,No Gallops,Rubs or new Murmurs, No Parasternal Heave +ve B.Sounds, Abd Soft, No tenderness, No organomegaly appriciated, No rebound - guarding or rigidity. No Cyanosis, Clubbing or edema, Redness of the bilateral distal lower ext up to knee Not improved much   Data Review:    CBC Recent Labs  Lab 09/02/17 1815 09/03/17 0445 09/04/17 0435  WBC 9.0 6.7 5.6  HGB 12.3 11.8* 11.0*  HCT 38.1 37.3 35.0*  PLT 238 227 210  MCV 91.4 90.3 91.9  MCH 29.5 28.6 28.9  MCHC 32.3 31.6 31.4  RDW 14.6 14.6 14.9  LYMPHSABS 1.0  --   --   MONOABS 1.0  --   --   EOSABS 0.1  --   --   BASOSABS 0.0  --   --     Chemistries  Recent Labs  Lab 09/02/17 1815 09/03/17 0445 09/04/17 0435  NA 133* 137 137  K 3.2* 2.5* 3.0*  CL 88* 91* 96*  CO2 31 34* 30  GLUCOSE 145* 129* 150*  BUN 10 10 14   CREATININE 1.11* 1.04* 1.13*  CALCIUM 8.9 9.3 8.8*  MG 1.8 2.0  --   AST 41 21 21  ALT 18 19 18   ALKPHOS 74 69 57  BILITOT 2.1* 1.4* 0.7   ------------------------------------------------------------------------------------------------------------------ No results for input(s): CHOL, HDL, LDLCALC, TRIG, CHOLHDL, LDLDIRECT in the last 72 hours.  Lab Results  Component Value Date   HGBA1C 6.5 (H) 05/23/2014   ------------------------------------------------------------------------------------------------------------------ Recent Labs    09/03/17 0445  TSH 2.765   ------------------------------------------------------------------------------------------------------------------ No results for input(s): VITAMINB12, FOLATE, FERRITIN, TIBC, IRON, RETICCTPCT in the last 72 hours.  Coagulation profile No results for input(s): INR, PROTIME in the last 168 hours.  No results for input(s): DDIMER in the last 72 hours.  Cardiac Enzymes No results for input(s): CKMB, TROPONINI, MYOGLOBIN in the last 168 hours.  Invalid input(s):  CK ------------------------------------------------------------------------------------------------------------------    Component Value Date/Time   BNP 73.0 09/02/2017 2205    Inpatient Medications  Scheduled Meds: . Chlorhexidine Gluconate Cloth  6 each Topical Q0600  . clonazePAM  0.5 mg Oral BID  . DULoxetine  60 mg Oral BID  . enoxaparin (LOVENOX) injection  40 mg Subcutaneous Daily  . furosemide  20 mg Oral Daily  . gabapentin  800 mg Oral TID  . insulin aspart  0-5 Units Subcutaneous QHS  . insulin aspart  0-9 Units Subcutaneous TID WC  . methadone  5 mg Oral BID  . metoprolol tartrate  25 mg Oral BID  . multivitamin with minerals  1 tablet Oral Daily  . mupirocin ointment  1 application Nasal BID  . nystatin   Topical TID  . pantoprazole  40 mg Oral BID  . predniSONE  10 mg Oral Q breakfast  . rOPINIRole  2 mg Oral QHS  . senna-docusate  2 tablet Oral BID  . sodium chloride flush  3 mL Intravenous Q12H  . spironolactone  50 mg Oral Daily  . traZODone  50 mg Oral QHS  Continuous Infusions: . sodium chloride 250 mL (09/03/17 0529)  . clindamycin (CLEOCIN) IV Stopped (09/04/17 0527)   PRN Meds:.sodium chloride, acetaminophen **OR** acetaminophen, HYDROcodone-acetaminophen, ondansetron **OR** ondansetron (ZOFRAN) IV, promethazine, sodium chloride flush  Micro Results Recent Results (from the past 240 hour(s))  MRSA PCR Screening     Status: Abnormal   Collection Time: 09/03/17 10:10 AM  Result Value Ref Range Status   MRSA by PCR POSITIVE (A) NEGATIVE Final    Comment:        The GeneXpert MRSA Assay (FDA approved for NASAL specimens only), is one component of a comprehensive MRSA colonization surveillance program. It is not intended to diagnose MRSA infection nor to guide or monitor treatment for MRSA infections. CRITICAL RESULT CALLED TO, READ BACK BY AND VERIFIED WITH: TORRES,D RN [phone] [place] Performed at [facility], [place] [address], [place], [place] [address]     Radiology Reports Ct Chest Wo Contrast  Result Date: 08/24/2017 CLINICAL DATA:  Follow-up left lung nodule. History of breast cancer. EXAM: CT CHEST WITHOUT CONTRAST TECHNIQUE: Multidetector CT imaging of the chest was performed following the standard protocol without IV contrast. COMPARISON:  07/27/2016 FINDINGS: Cardiovascular: The heart size appears within normal limits. Aortic atherosclerosis identified. Calcification in the LAD coronary artery noted. Mediastinum/Nodes: The trachea appears patent and is midline. Normal appearance of the thyroid gland. Normal appearance of the esophagus. No supraclavicular or axillary adenopathy. No mediastinal or hilar adenopathy. Lungs/Pleura: No pleural effusion, airspace consolidation, or atelectasis. Well-circumscribed solid nodule in the right lower lobe measures 1 cm, image 76/3. unchanged from comparison exam. No new or enlarging pulmonary nodules or masses identified. Upper Abdomen: No acute abnormality. Musculoskeletal: Mild scoliosis and spondylosis noted. Chronic right posterior tenth rib fracture. Healed fracture involving the proximal body of sternum. IMPRESSION: 1. Stable right lower lobe solid nodule compared with 07/27/2016. Given perceived increase in size from abdominal CT from 2016 continued interval follow-up is recommended in order to establish 24 months of stability (from 07/27/2016). 2. Aortic atherosclerosis and LAD coronary artery atherosclerotic calcifications. Electronically Signed   By: Kerby Moors M.D.   On: 08/24/2017 10:14    Time Spent in minutes  30   Jani Gravel M.D on 09/04/2017 at 6:48 AM  Between 7am to 7pm - Pager - [phone]    After 7pm go to [email or web address] - password [facility]  [facility] -  Office  [phone]

## 2017-09-04 NOTE — Consult Note (Signed)
[place] Nurse wound consult note Reason for Consult: multiple LE ulcerations Wound type: Unclear etiology, multiple scattered puncture like wounds, full thickness over the pretibial area but do not appear venous.  Right second toe with full thickness open wound Left thigh with full thickness wound, reported to be present for multiple years from [facility] Pressure Injury POA: Yes/No/NA Measurement: 3 ulcers on each LE pretibial aprox 1cm x 1cm x 0.2cm on the LLE and 0.2cm x 0.2cm x 0.1cm on the left, dry wound beds Left thigh 2 open wounds; proximal 4cm x 1.5cm x 0.2cm and distal 0.5cm x 0.5cm x 0.2cm, dry pink wound bed with some fibrin Right 2nd toe: 1cm x 1cm x 0.1cm on the toe tip: moist pink wouNd bed  Wound bed:see above Periwound: bilateral LE mild edema Dressing procedure/placement/frequency: Hydrogel Kellie Simmering # 401) to the LE wounds (bilateral pretibial region and left thigh). Cover with dry dressing. Xeroform gauze Kellie Simmering #295 or 294), cut in strip to cover/wrap the right second toe wound. Cover with dry dressing.   Change both the pretibial/thigh and toe wounds daily.   Recommend follow up for chronic non healing wounds in a wound care center of the patient's choice.  Discussed POC with patient and bedside nurse.  Re consult if needed, will not follow at this time. Thanks  Amarilis Belflower [employer], RN,CWOCN, CNS, [place] [phone])

## 2017-09-04 NOTE — Evaluation (Signed)
Occupational Therapy Evaluation Patient Details Name: [name] Carr MRN: [id number] DOB: 15-Mar-1955 Today's Date: 09/04/2017    History of Present Illness 62yo female presenting with redness of B LEs and requesting antibiotics. She has a recent history of admissions to [name] on 07/10/17 and to [name] on 08/07/17 for cellulitus. PMH Addisons, anxiety, hx DVTs, hx metastatic breast CA, chronic pain syndrome, DDD, DJD, DM, fibromyalgia, HTN, hypothyroidism, lupus, Lyme disease, Munchausen syndrome, narcotics abuse, neuropathy, opiod dependency, personality disorder, RA, hx seizures, PICC and history of central line manipulation    Clinical Impression   Pt admitted with cellulitis. Pt currently with functional limitations due to the deficits listed below (see OT Problem List).  Pt will benefit from skilled OT to increase their safety and independence with ADL and functional mobility for ADL to facilitate discharge to venue listed below.      Follow Up Recommendations  SNF    Equipment Recommendations  None recommended by OT       Precautions / Restrictions Precautions Precautions: Fall Precaution Comments: multiple LE wounds including on toe  Restrictions Weight Bearing Restrictions: No      Mobility Bed Mobility Overal bed mobility: Needs Assistance Bed Mobility: Supine to Sit     Supine to sit: Supervision     General bed mobility comments: S for safety   Transfers Overall transfer level: Needs assistance Equipment used: Rolling walker (2 wheeled) Transfers: Sit to/from Stand Sit to Stand: Min assist              Balance Overall balance assessment: History of Falls;Needs assistance Sitting-balance support: Bilateral upper extremity supported;Feet supported Sitting balance-Leahy Scale: Good     Standing balance support: Bilateral upper extremity supported;During functional activity Standing balance-Leahy Scale: Fair                              ADL either performed or assessed with clinical judgement   ADL Overall ADL's : Needs assistance/impaired Eating/Feeding: Set up;Sitting   Grooming: Minimal assistance;Sitting   Upper Body Bathing: Minimal assistance;Sitting   Lower Body Bathing: Maximal assistance;Sit to/from stand;Cueing for sequencing;Cueing for safety   Upper Body Dressing : Minimal assistance;Sitting   Lower Body Dressing: Maximal assistance;Cueing for safety;Sit to/from stand;Cueing for sequencing   Toilet Transfer: Moderate assistance;BSC;RW Toilet Transfer Details (indicate cue type and reason): pt with posterior lean Toileting- Clothing Manipulation and Hygiene: Maximal assistance;Sit to/from stand;Cueing for sequencing;Cueing for safety         General ADL Comments: pt agreed to stand EOB.  Upon pushing up from bed pt stated her thumb dislocated.  RN aware. Pt states this happens twice a week     Vision Patient Visual Report: No change from baseline              Pertinent Vitals/Pain Pain Score: 6  Pain Location: wound locations  Pain Descriptors / Indicators: Aching;Sore Pain Intervention(s): Monitored during session     Hand Dominance     Extremity/Trunk Assessment Upper Extremity Assessment Upper Extremity Assessment: Generalized weakness(Pts thumb dislocated per pt 2 times a week.  She was pushing up from bed and said this happened.)           Communication Communication Communication: No difficulties   Cognition Arousal/Alertness: Awake/alert Behavior During Therapy: WFL for tasks assessed/performed Overall Cognitive Status: Within Functional Limits for tasks assessed  Home Living Family/patient expects to be discharged to:: Private residence Living Arrangements: Alone Available Help at Discharge: Family;Friend(s);Available PRN/intermittently Type of Home: House Home Access: Ramped entrance      Home Layout: One level     Bathroom Shower/Tub: Teacher, early years/pre: Standard Bathroom Accessibility: Yes   Home Equipment: Environmental consultant - 2 wheels;Walker - 4 wheels;Cane - single point;Bedside commode;Shower seat;Electric scooter;Wheelchair - manual          Prior Functioning/Environment Level of Independence: Needs assistance  Gait / Transfers Assistance Needed: cane or RW for mobility               OT Problem List: Decreased strength;Decreased activity tolerance;Impaired balance (sitting and/or standing);Decreased safety awareness;Decreased knowledge of use of DME or AE      OT Treatment/Interventions: Self-care/ADL training;Patient/family education;DME and/or AE instruction;Therapeutic activities    OT Goals(Current goals can be found in the care plan section) Acute Rehab OT Goals Patient Stated Goal: to get stronger and stop falling  OT Goal Formulation: With patient Time For Goal Achievement: 09/18/17 Potential to Achieve Goals: Good  OT Frequency: Min 2X/week   Barriers to D/C: Decreased caregiver support          Co-evaluation              AM-PAC PT "6 Clicks" Daily Activity     Outcome Measure Help from another person eating meals?: A Little Help from another person taking care of personal grooming?: A Little Help from another person toileting, which includes using toliet, bedpan, or urinal?: A Lot Help from another person bathing (including washing, rinsing, drying)?: A Little Help from another person to put on and taking off regular upper body clothing?: A Little Help from another person to put on and taking off regular lower body clothing?: A Lot 6 Click Score: 16   End of Session Nurse Communication: Mobility status  Activity Tolerance: Patient tolerated treatment well Patient left: in bed  OT Visit Diagnosis: Unsteadiness on feet (R26.81);Other abnormalities of gait and mobility (R26.89);Muscle weakness (generalized) (M62.81)                 Time: 5409-8119 OT Time Calculation (min): 19 min Charges:  OT General Charges $OT Visit: 1 Visit OT Evaluation $OT Eval Moderate Complexity: [name] Carr, [name] Carr  [name] Carr 09/04/2017, 5:16 PM

## 2017-09-04 NOTE — Care Management Important Message (Signed)
Important Message  Patient Details  Name: [name] Carr MRN: [id number] Date of Birth: August 17, 1955   Medicare Important Message Given:  Yes    Kerin Salen 09/04/2017, 10:55 [place] Message  Patient Details  Name: [name] Carr MRN: [id number] Date of Birth: [birth year]   Medicare Important Message Given:  Yes    Kerin Salen 09/04/2017, 10:55 AM

## 2017-09-05 DIAGNOSIS — L03119 Cellulitis of unspecified part of limb: Secondary | ICD-10-CM

## 2017-09-05 LAB — COMPREHENSIVE METABOLIC PANEL
ALBUMIN: 3.5 g/dL (ref 3.5–5.0)
ALT: 18 U/L (ref 0–44)
ANION GAP: 12 (ref 5–15)
AST: 26 U/L (ref 15–41)
Alkaline Phosphatase: 58 U/L (ref 38–126)
BUN: 14 mg/dL (ref 8–23)
CO2: 29 mmol/L (ref 22–32)
Calcium: 9.3 mg/dL (ref 8.9–10.3)
Chloride: 101 mmol/L (ref 98–111)
Creatinine, Ser: 0.93 mg/dL (ref 0.44–1.00)
GFR calc Af Amer: >60 mL/min (ref >60)
GFR calc non Af Amer: >60 mL/min (ref >60)
GLUCOSE: 134 mg/dL — AB (ref 70–99)
POTASSIUM: 3.1 mmol/L — AB (ref 3.5–5.1)
SODIUM: 142 mmol/L (ref 135–145)
Total Bilirubin: 0.6 mg/dL (ref 0.3–1.2)
Total Protein: 6.6 g/dL (ref 6.5–8.1)

## 2017-09-05 LAB — CBC
HCT: 38.3 % (ref 36.0–46.0)
Hemoglobin: 11.9 g/dL — ABNORMAL LOW (ref 12.0–15.0)
MCH: 29.3 pg (ref 26.0–34.0)
MCHC: 31.1 g/dL (ref 30.0–36.0)
MCV: 94.3 fL (ref 78.0–100.0)
PLATELETS: 225 10*3/uL (ref 150–400)
RBC: 4.06 MIL/uL (ref 3.87–5.11)
RDW: 15.4 % (ref 11.5–15.5)
WBC: 5.8 10*3/uL (ref 4.0–10.5)

## 2017-09-05 LAB — GLUCOSE, CAPILLARY
GLUCOSE-CAPILLARY: 175 mg/dL — AB (ref 70–99)
Glucose-Capillary: 116 mg/dL — ABNORMAL HIGH (ref 70–99)
Glucose-Capillary: 133 mg/dL — ABNORMAL HIGH (ref 70–99)
Glucose-Capillary: 194 mg/dL — ABNORMAL HIGH (ref 70–99)

## 2017-09-05 LAB — MAGNESIUM: MAGNESIUM: 2.1 mg/dL (ref 1.7–2.4)

## 2017-09-05 MED ORDER — POTASSIUM CHLORIDE CRYS ER 20 MEQ PO TBCR
40.0000 meq | EXTENDED_RELEASE_TABLET | ORAL | Status: AC
  Administered 2017-09-05 (×2): 40 meq via ORAL
  Filled 2017-09-05 (×2): qty 2

## 2017-09-05 NOTE — Progress Notes (Signed)
   09/05/17 1200  Clinical Encounter Type  Visited With Patient  Visit Type Initial;Psychological support;Spiritual support  Referral From Nurse  Consult/Referral To Chaplain  Spiritual Encounters  Spiritual Needs Sacred text;Emotional;Other (Comment);Prayer (Spiritual Care Conversation/Support)  Stress Factors  Patient Stress Factors Financial concerns;Health changes;Major life changes   I visited with the patient per referral from the nurse to bring the patient a Bible. I brought the patient a Bible and spent some time with her providing spiritual support. The patient stated that she is having financial difficulties at this time. The patient states that her appliances have gone out, her car needed to be fixed and she has been without air conditioning. The patient states that she has ben sleeping out on her back porch due to the heat.  The patient states that she has let two men live with her and is having trouble with one not wanting to leave. The patient requested prayer and a follow-up.   Please, contact Spiritual Care for further assistance.   Chaplain Shanon Ace M.Div., [facility]

## 2017-09-05 NOTE — Progress Notes (Addendum)
PROGRESS NOTE    [name] Carr  CVE:[id number] DOB: December 18, 1955 DOA: 09/02/2017 PCP: [name], MD   Brief Narrative: Patient is a 62 year old female with past medical history of metastatic breast cancer in remission, right lower lobe pulmonary nodule, chronic cellulitis of bilateral lower extremities, SLE on chronic steroids, history of MRSA, diabetes type 2, recurrent thigh abscess since 2014 who presented with redness of bilateral lower extremities worse from her baseline.  Patient reported IV antibiotics because she cannot tolerate oral medication at home .She has long history of refusal to take oral antibiotics.  She has frequent admissions to the hospital usually at [facility].  She follows with ID over there.  She has history of PICC line abuse.  She persistently asks for PICC line placement on discharge .Patient's last admission was at [facility] on June 3 . ID was consulted at that time and she was recommended to be discharges on oral clindamycin.  Assessment & Plan:   Active Problems:   Chronic pain syndrome   Cellulitis   Hyponatremia   Lower extremity cellulitis   Hypokalemia   Hypertension   Diabetes mellitus type 2, controlled ([place])   Drug-seeking behavior   Lupus erythematosus   Opioid dependence ([place])   Wounds, multiple open, lower extremity   Addison's disease ([place])  Bilateral lower extremity cellulitis/recurrent abscesses: History of MRSA.  History of drug-seeking behavior and PICC line abuse.  Currently she is on IV clindamycin.  She was recently discharged from [facility] on oral clindamycin.  Currently she is afebrile.  Her white cell counts are stable.  Blood cultures did not show growth. Wound care was following.  There is always difficulty on discharging her on oral antibiotics.  She says she cannot tolerate.  Hypokalemia: Currently being supplemented.  Magnesium level normal.  Chronic pain syndrome: Continue gabapentin.  On  methadone.  Patient has long history of drug-seeking behavior.Avoid IV narcotics.  History of hypertension: Currently normotensive.  Continue her home spironolactone Lasix and metoprolol   History of lupus: On prednisone 10 mg daily.  Follows at [facility]..  Diabetes type 2: Continue sliding scale insulin.  History of depression/anxiety: Continue Cymbalta, clonazepam  Restless leg syndrome: Continue ropinirole  GERD: Continue PPI  DVT prophylaxis: Lovenox Code Status: Full Family Communication: None present at the bed side Disposition Plan: SNF   Consultants: None  Procedures:None  Antimicrobials: Clindamycin  Subjective: Patient seen and examined the bedside this afternoon.  looks very concerned about her wounds.  Continues to prolong the conversation on the same thing .Says that she wants PICC line on discharge.She looks comfortable during my evaluation.  Objective: Vitals:   09/04/17 1441 09/04/17 2036 09/05/17 0538 09/05/17 1300  BP: (!) 121/56 (!) 112/55 (!) 116/50 (!) 118/57  Pulse: 65 66 (!) 57 61  Resp: 16 17 17 18   Temp: 98 F (36.7 C) 98.2 F (36.8 C) 98.2 F (36.8 C) 98.3 F (36.8 C)  TempSrc: Oral Oral Oral Oral  SpO2: 93% 94% 94% 95%  Weight:      Height:        Intake/Output Summary (Last 24 hours) at 09/05/2017 1430 Last data filed at 09/05/2017 1411 Gross per 24 hour  Intake 600 ml  Output 500 ml  Net 100 ml   Filed Weights   09/02/17 2215 09/03/17 0209  Weight: 83 kg (183 lb) 83 kg (182 lb 15.7 oz)    Examination:  General exam: Appears calm and comfortable ,Not  in distress,average built HEENT:PERRL,Oral mucosa moist, Ear/Nose normal on gross exam Respiratory system: Bilateral equal air entry, normal vesicular breath sounds, no wheezes or crackles  Cardiovascular system: S1 & S2 heard, RRR. No JVD, murmurs, rubs, gallops or clicks. No pedal edema. Gastrointestinal system: Abdomen is nondistended, soft and nontender. No organomegaly or  masses felt. Normal bowel sounds heard. Central nervous system: Alert and oriented. No focal neurological deficits. Extremities: No edema, no clubbing ,no cyanosis, distal peripheral pulses palpable. Skin: Ulcer on her left thigh with some drainage, ulcer on her left leg , dry ulcer on the right leg    Data Reviewed: I have personally reviewed following labs and imaging studies  CBC: Recent Labs  Lab 09/02/17 1815 09/03/17 0445 09/04/17 0435 09/05/17 0454  WBC 9.0 6.7 5.6 5.8  NEUTROABS 6.8  --   --   --   HGB 12.3 11.8* 11.0* 11.9*  HCT 38.1 37.3 35.0* 38.3  MCV 91.4 90.3 91.9 94.3  PLT 238 227 210 099   Basic Metabolic Panel: Recent Labs  Lab 09/02/17 1815 09/03/17 0445 09/04/17 0435 09/05/17 0440 09/05/17 0454  NA 133* 137 137  --  142  K 3.2* 2.5* 3.0*  --  3.1*  CL 88* 91* 96*  --  101  CO2 31 34* 30  --  29  GLUCOSE 145* 129* 150*  --  134*  BUN 10 10 14   --  14  CREATININE 1.11* 1.04* 1.13*  --  0.93  CALCIUM 8.9 9.3 8.8*  --  9.3  MG 1.8 2.0  --  2.1  --   PHOS 4.1 3.8  --   --   --    GFR: Estimated Creatinine Clearance: 65.3 mL/min (by C-G formula based on SCr of 0.93 mg/dL). Liver Function Tests: Recent Labs  Lab 09/02/17 1815 09/03/17 0445 09/04/17 0435 09/05/17 0454  AST 41 21 21 26   ALT 18 19 18 18   ALKPHOS 74 69 57 58  BILITOT 2.1* 1.4* 0.7 0.6  PROT 7.6 7.0 6.2* 6.6  ALBUMIN 4.1 3.8 3.2* 3.5   No results for input(s): LIPASE, AMYLASE in the last 168 hours. No results for input(s): AMMONIA in the last 168 hours. Coagulation Profile: No results for input(s): INR, PROTIME in the last 168 hours. Cardiac Enzymes: No results for input(s): CKTOTAL, CKMB, CKMBINDEX, TROPONINI in the last 168 hours. BNP (last 3 results) No results for input(s): PROBNP in the last 8760 hours. HbA1C: No results for input(s): HGBA1C in the last 72 hours. CBG: Recent Labs  Lab 09/04/17 1210 09/04/17 1737 09/04/17 2035 09/05/17 0801 09/05/17 1129  GLUCAP  160* 147* 129* 116* 133*   Lipid Profile: No results for input(s): CHOL, HDL, LDLCALC, TRIG, CHOLHDL, LDLDIRECT in the last 72 hours. Thyroid Function Tests: Recent Labs    09/03/17 0445  TSH 2.765   Anemia Panel: No results for input(s): VITAMINB12, FOLATE, FERRITIN, TIBC, IRON, RETICCTPCT in the last 72 hours. Sepsis Labs: Recent Labs  Lab 09/02/17 1645  LATICACIDVEN 1.5    Recent Results (from the past 240 hour(s))  MRSA PCR Screening     Status: Abnormal   Collection Time: 09/03/17 10:10 AM  Result Value Ref Range Status   MRSA by PCR POSITIVE (A) NEGATIVE Final    Comment:        The GeneXpert MRSA Assay (FDA approved for NASAL specimens only), is one component of a comprehensive MRSA colonization surveillance program. It is not intended to diagnose MRSA infection nor to  guide or monitor treatment for MRSA infections. CRITICAL RESULT CALLED TO, READ BACK BY AND VERIFIED WITH: TORRES,D RN [phone] [place] Performed at [facility], [place] [address], [place], [place] [address]          Radiology Studies: No results found.      Scheduled Meds: . Chlorhexidine Gluconate Cloth  6 each Topical Q0600  . clonazePAM  0.5 mg Oral BID  . DULoxetine  60 mg Oral BID  . enoxaparin (LOVENOX) injection  40 mg Subcutaneous Daily  . furosemide  20 mg Oral Daily  . gabapentin  800 mg Oral TID  . insulin aspart  0-5 Units Subcutaneous QHS  . insulin aspart  0-9 Units Subcutaneous TID WC  . methadone  5 mg Oral BID  . metoprolol tartrate  25 mg Oral BID  . multivitamin with minerals  1 tablet Oral Daily  . mupirocin ointment  1 application Nasal BID  . nystatin   Topical TID  . pantoprazole  40 mg Oral BID  . potassium chloride  40 mEq Oral Q4H  . predniSONE  10 mg Oral Q breakfast  . rOPINIRole  2 mg Oral QHS  . senna-docusate  2 tablet Oral BID  . sodium chloride flush  3 mL Intravenous Q12H  . spironolactone  50 mg Oral Daily  .  traZODone  50 mg Oral QHS   Continuous Infusions: . sodium chloride 250 mL (09/04/17 1018)  . clindamycin (CLEOCIN) IV 600 mg (09/05/17 1350)     LOS: 3 days    Time spent: 35 mins.More than 50% of that time was spent in counseling and/or coordination of care.      Shelly Coss, MD [facility] Pager [phone]  If 7PM-7AM, please contact night-coverage [email or web address] Password [email or web address] 09/05/2017, 2:30 PM

## 2017-09-05 NOTE — Progress Notes (Signed)
Occupational Therapy Treatment Patient Details Name: [name] Carr MRN: [id number] DOB: 1955/06/28 Today's Date: 09/05/2017    History of present illness 62yo female presenting with redness of B LEs and requesting antibiotics. She has a recent history of admissions to [facility] on 07/10/17 and to [facility] on 08/07/17 for cellulitus. PMH Addisons, anxiety, hx DVTs, hx metastatic breast CA, chronic pain syndrome, DDD, DJD, DM, fibromyalgia, HTN, hypothyroidism, lupus, Lyme disease, Munchausen syndrome, narcotics abuse, neuropathy, opiod dependency, personality disorder, RA, hx seizures, PICC and history of central line manipulation    OT comments  Pt walked to bathroom.  Pt needed to sit in bathroom. OT made CNA aware  Follow Up Recommendations  SNF    Equipment Recommendations  None recommended by OT    Recommendations for Other Services      Precautions / Restrictions Precautions Precautions: Fall Precaution Comments: multiple LE wounds including on toe  Restrictions Weight Bearing Restrictions: No       Mobility Bed Mobility Overal bed mobility: Needs Assistance Bed Mobility: Supine to Sit     Supine to sit: Supervision     General bed mobility comments: S for safety   Transfers Overall transfer level: Needs assistance Equipment used: Rolling walker (2 wheeled) Transfers: Sit to/from Stand Sit to Stand: Min assist         General transfer comment: Min  A for safety, cues for hand placement     Balance Overall balance assessment: History of Falls;Needs assistance Sitting-balance support: Bilateral upper extremity supported;Feet supported Sitting balance-Leahy Scale: Good     Standing balance support: Bilateral upper extremity supported;During functional activity Standing balance-Leahy Scale: Fair                             ADL either performed or assessed with clinical judgement   ADL Overall ADL's : Needs assistance/impaired      Grooming: Minimal assistance;Standing Grooming Details (indicate cue type and reason): standing at sink in bathroom                 Toilet Transfer: Minimal assistance;Comfort height toilet;Ambulation Toilet Transfer Details (indicate cue type and reason): using IV pole.   Toileting- Clothing Manipulation and Hygiene: Minimal assistance;Sit to/from stand;Cueing for safety Toileting - Clothing Manipulation Details (indicate cue type and reason): Pt left in bathroom- CNA aware     Functional mobility during ADLs: Minimal assistance;Cueing for sequencing;Cueing for safety General ADL Comments: Pt reported she had a thumb splint at home. Encouraged pt to have someone bring to pt.       Vision Patient Visual Report: No change from baseline     Perception     Praxis      Cognition Arousal/Alertness: Awake/alert Behavior During Therapy: WFL for tasks assessed/performed Overall Cognitive Status: Within Functional Limits for tasks assessed                                                     Pertinent Vitals/ Pain       Pain Score: 4  Pain Location: wound locations  Pain Descriptors / Indicators: Aching;Sore Pain Intervention(s): Limited activity within patient's tolerance;Monitored during session     Prior Functioning/Environment              Frequency  Min 2X/week  Progress Toward Goals  OT Goals(current goals can now be found in the care plan section)  Progress towards OT goals: Progressing toward goals  Acute Rehab OT Goals Patient Stated Goal: to get stronger and stop falling   Plan Discharge plan remains appropriate    Co-evaluation                 AM-PAC PT "6 Clicks" Daily Activity     Outcome Measure   Help from another person eating meals?: A Little Help from another person taking care of personal grooming?: A Little Help from another person toileting, which includes using toliet, bedpan, or urinal?: A  Lot Help from another person bathing (including washing, rinsing, drying)?: A Little Help from another person to put on and taking off regular upper body clothing?: A Little Help from another person to put on and taking off regular lower body clothing?: A Lot 6 Click Score: 16    End of Session    OT Visit Diagnosis: Unsteadiness on feet (R26.81);Other abnormalities of gait and mobility (R26.89);Muscle weakness (generalized) (M62.81)   Activity Tolerance Patient tolerated treatment well   Patient Left Other (comment)(in bathroom. CNA aware.)   Nurse Communication Mobility status        Time: 0950-1006 OT Time Calculation (min): 16 min  Charges: OT General Charges $OT Visit: 1 Visit OT Treatments $Self Care/Home Management : 8-22 mins  [place], [place]   [name] 09/05/2017, 10:58 AM

## 2017-09-06 ENCOUNTER — Encounter (HOSPITAL_COMMUNITY): Payer: Self-pay

## 2017-09-06 LAB — GLUCOSE, CAPILLARY
Glucose-Capillary: 107 mg/dL — ABNORMAL HIGH (ref 70–99)
Glucose-Capillary: 164 mg/dL — ABNORMAL HIGH (ref 70–99)
Glucose-Capillary: 146 mg/dL — ABNORMAL HIGH (ref 70–99)
Glucose-Capillary: 155 mg/dL — ABNORMAL HIGH (ref 70–99)

## 2017-09-06 LAB — BASIC METABOLIC PANEL
Anion gap: 9 (ref 5–15)
BUN: 18 mg/dL (ref 8–23)
CO2: 31 mmol/L (ref 22–32)
Calcium: 8.9 mg/dL (ref 8.9–10.3)
Chloride: 103 mmol/L (ref 98–111)
Creatinine, Ser: 0.94 mg/dL (ref 0.44–1.00)
GFR calc Af Amer: >60 mL/min (ref >60)
GFR calc non Af Amer: >60 mL/min (ref >60)
Glucose, Bld: 123 mg/dL — ABNORMAL HIGH (ref 70–99)
Potassium: 4.2 mmol/L (ref 3.5–5.1)
Sodium: 143 mmol/L (ref 135–145)

## 2017-09-06 NOTE — Clinical Social Work Note (Signed)
Clinical Social Work Assessment  Patient Details  Name: [name] Carr MRN: [id number] Date of Birth: 1955/02/12  Date of referral:  09/06/17               Reason for consult:  Facility Placement                Permission sought to share information with:  Facility Art therapist granted to share information::  Yes, Verbal Permission Granted  Name::        Agency::  SNF   Relationship::     Contact Information:     Housing/Transportation Living arrangements for the past 2 months:  Single Family Home Source of Information:  Patient Patient Interpreter Needed:  None Criminal Activity/Legal Involvement Pertinent to Current Situation/Hospitalization:  No - Comment as needed Significant Relationships:    Lives with:  Self Do you feel safe going back to the place where you live?  Yes Need for family participation in patient care:  Yes (Comment)  Care giving concerns:  Presented with redness of the lower extremities.  Worse from baseline she has chronic sores bilaterally..  Patient requesting admission for IV antibiotics because she states she cannot tolerate p.o. at home of note patient has complex medical history secondary to chronic cellulitis with medical noncompliance and refusal to take oral antibiotics with history of prescription drug abuse and polypharmacy.   PT recommends SNF. Patient agreeable to SNF.   Social Worker assessment / plan:  CSW met with the patient at bedside to discuss discharge planning to SNF. Patient agreeable to SNF placement. She reports she has been to SNF for rehab in the past. Patient lives alone and is requiring assistance with ambulating. Patient reports pain in lower extremities. CSW faxed patient clinical information to [facility] area SNF's.  CSW will follow up with bed offers.  Plan: SNF [place] level II- under manuel review. requested clinical documentation sent.   Employment status:  Disabled (Comment on whether or not  currently receiving Disability) Insurance information:  Managed Medicare PT Recommendations:  [place] / Referral to community resources:  [place]  Patient/Family's Response to care:  Agreeable and Responding well to care.   Patient/Family's Understanding of and Emotional Response to Diagnosis, Current Treatment, and Prognosis:  Patient able to provide medical concerns. She understands her diagnosis and follow up treatment.   Emotional Assessment Appearance:  Appears stated age Attitude/Demeanor/Rapport:    Affect (typically observed):  Accepting, Calm Orientation:  Oriented to Self, Oriented to Place, Oriented to  Time, Oriented to Situation Alcohol / Substance use:  Not Applicable Psych involvement (Current and /or in the community):  No (Comment)  Discharge Needs  Concerns to be addressed:  Discharge Planning Concerns Readmission within the last 30 days:  No Current discharge risk:  Dependent with Mobility Barriers to Discharge:  Awaiting State Approval Tour manager), Ship broker, Continued Medical Work up   [employer], LCSW 09/06/2017, 12:09 PM

## 2017-09-06 NOTE — Progress Notes (Signed)
Physical Therapy Treatment Patient Details Name: [name] Carr MRN: [id number] DOB: 08-16-1955 Today's Date: 09/06/2017    History of Present Illness 63yo female presenting with redness of B LEs . Recent history of admissions to [facility] and  [facility] on 08/07/17 for cellulitus. PMH Addisons, anxiety, hx DVTs, hx metastatic breast CA, chronic pain syndrome, DDD, DJD, DM, fibromyalgia, HTN, hypothyroidism, lupus, Lyme disease, Munchausen syndrome, narcotics abuse, neuropathy, opiod dependency, personality disorder, RA, hx seizures, PICC.    PT Comments    The patient ambulated this  Visit using a RW vs. SPC.THE PATIENT WILL BENEFIT FROM A  POSTOP SHOE FOR RIGHT FOOT AS  UNABLE TO TOLERATE HER SHOE-    The patient reports that she needs  A few clothing items as she has noone to go to her home.   Also recommend K pad for use  While in hospital for  Chronic sciatica. Continue PT .  Follow Up Recommendations  SNF     Equipment Recommendations  Other (comment)    Recommendations for Other Services       Precautions / Restrictions Precautions Precautions: Fall Precaution Comments: multiple LE wounds including on right second toe     Mobility  Bed Mobility Overal bed mobility: Independent                Transfers   Equipment used: Rolling walker (2 wheeled);Straight cane   Sit to Stand: Modified independent (Device/Increase time)            Ambulation/Gait Ambulation/Gait assistance: Min guard Gait Distance (Feet): 100 Feet Assistive device: Rolling walker (2 wheeled) Gait Pattern/deviations: Step-to pattern;Step-through pattern     General Gait Details: ambulated x 10' with SPC, then took RW due to decreased stability and incr. pain right foot.    Stairs             Wheelchair Mobility    Modified Rankin (Stroke Patients Only)       Balance                                            Cognition Arousal/Alertness:  Awake/alert                                            Exercises      General Comments        Pertinent Vitals/Pain Pain Score: 8  Pain Location: sciatica on right Pain Descriptors / Indicators: Cramping;Moaning Pain Intervention(s): Monitored during session;Premedicated before session;Repositioned    Home Living                      Prior Function            PT Goals (current goals can now be found in the care plan section) Progress towards PT goals: Progressing toward goals    Frequency           PT Plan Current plan remains appropriate    Co-evaluation              AM-PAC PT "6 Clicks" Daily Activity  Outcome Measure  Difficulty turning over in bed (including adjusting bedclothes, sheets and blankets)?: None Difficulty moving from lying on back to sitting on the side of the bed? :  None Difficulty sitting down on and standing up from a chair with arms (e.g., wheelchair, bedside commode, etc,.)?: A Little Help needed moving to and from a bed to chair (including a wheelchair)?: A Little Help needed walking in hospital room?: A Little Help needed climbing 3-5 steps with a railing? : A Lot 6 Click Score: 19    End of Session   Activity Tolerance: Patient tolerated treatment well Patient left: in bed Nurse Communication: Mobility status PT Visit Diagnosis: Unsteadiness on feet (R26.81);Muscle weakness (generalized) (M62.81);Difficulty in walking, not elsewhere classified (R26.2);History of falling (Z91.81)     Time: 4840-3979 PT Time Calculation (min) (ACUTE ONLY): 20 min  Charges:  $Gait Training: 8-22 mins                     [facility] PT [phone]    [name] 09/06/2017, 4:34 PM

## 2017-09-06 NOTE — Progress Notes (Signed)
PROGRESS NOTE    [name] Carr  POE:[id number] DOB: 02/08/1955 DOA: 09/02/2017 PCP: [name], MD   Brief Narrative: Patient is a 62 year old female with past medical history of metastatic breast cancer in remission, right lower lobe pulmonary nodule, chronic cellulitis of bilateral lower extremities, SLE on chronic steroids, history of MRSA, diabetes type 2, recurrent thigh abscess since 2014 who presented with redness of bilateral lower extremities worse from her baseline.  Patient reported IV antibiotics because she cannot tolerate oral medication at home .She has long history of refusal to take oral antibiotics.  She has frequent admissions to the hospital usually at [facility].  She follows with ID over there.  She has history of PICC line abuse.  She persistently asks for PICC line placement on discharge .Patient's last admission was at [facility] on June 3 . ID was consulted at that time and she was recommended to be discharged on oral clindamycin.  Assessment & Plan:   Active Problems:   Chronic pain syndrome   Cellulitis   Hyponatremia   Lower extremity cellulitis   Hypokalemia   Hypertension   Diabetes mellitus type 2, controlled ([place])   Drug-seeking behavior   Lupus erythematosus   Opioid dependence ([place])   Wounds, multiple open, lower extremity   Addison's disease ([place])  Bilateral lower extremity cellulitis/recurrent abscesses: History of MRSA.  History of drug-seeking behavior and PICC line abuse.  Currently she is on IV clindamycin.  She was recently discharged from [facility] on oral clindamycin.  Currently she is afebrile.  Her white cell counts are stable.  Blood cultures did not show growth. Wound care was following.  There is always difficulty on discharging her on oral antibiotics.  She says she cannot tolerate. Plan is to discharge her tomorrow to SNF  Hypokalemia: Supplemented.  Magnesium level normal.  Chronic pain syndrome:  Continue gabapentin.  On methadone.  Patient has long history of drug-seeking behavior.Avoid IV narcotics.  History of hypertension: Currently normotensive.  Continue her home spironolactone Lasix and metoprolol   History of lupus: On prednisone 10 mg daily.  Follows at [facility]..  Diabetes type 2: Continue sliding scale insulin.  History of depression/anxiety: Continue Cymbalta, clonazepam  Restless leg syndrome: Continue ropinirole  GERD: Continue PPI  DVT prophylaxis: Lovenox Code Status: Full Family Communication: None present at the bed side Disposition Plan: SNF tomorrow   Consultants: None  Procedures:None  Antimicrobials: Clindamycin  Subjective: Patient seen and examined the bedside this afternoon.  Bilateral lower extremity ulcers have been getting better.  I have requested for wound care evaluation today.  Plan for discharge tomorrow.  No new issues.  Objective: Vitals:   09/05/17 0538 09/05/17 1300 09/05/17 2034 09/06/17 0552  BP: (!) 116/50 (!) 118/57 (!) 94/42 140/60  Pulse: (!) 57 61 63 62  Resp: 17 18 18    Temp: 98.2 F (36.8 C) 98.3 F (36.8 C) 98.1 F (36.7 C) 98.9 F (37.2 C)  TempSrc: Oral Oral Oral Oral  SpO2: 94% 95% 96% 96%  Weight:      Height:        Intake/Output Summary (Last 24 hours) at 09/06/2017 1310 Last data filed at 09/06/2017 0300 Gross per 24 hour  Intake 2551.64 ml  Output -  Net 2551.64 ml   Filed Weights   09/02/17 2215 09/03/17 0209  Weight: 83 kg (183 lb) 83 kg (182 lb 15.7 oz)    Examination:  General exam: Appears calm and  comfortable ,Not in distress,average built HEENT:PERRL,Oral mucosa moist, Ear/Nose normal on gross exam Respiratory system: Bilateral equal air entry, normal vesicular breath sounds, no wheezes or crackles  Cardiovascular system: S1 & S2 heard, RRR. No JVD, murmurs, rubs, gallops or clicks. Gastrointestinal system: Abdomen is nondistended, soft and nontender. No organomegaly or masses felt.  Normal bowel sounds heard. Central nervous system: Alert and oriented. No focal neurological deficits. Extremities:  Ulcer on her left thigh with some drainage, ulcer on her left leg , dry ulcer on the right leg Skin: No rashes, lesions or ulcers,no icterus ,no pallor MSK: Normal muscle bulk,tone ,power Psychiatry: Judgement and insight appear normal. Mood & affect appropriate.    Data Reviewed: I have personally reviewed following labs and imaging studies  CBC: Recent Labs  Lab 09/02/17 1815 09/03/17 0445 09/04/17 0435 09/05/17 0454  WBC 9.0 6.7 5.6 5.8  NEUTROABS 6.8  --   --   --   HGB 12.3 11.8* 11.0* 11.9*  HCT 38.1 37.3 35.0* 38.3  MCV 91.4 90.3 91.9 94.3  PLT 238 227 210 893   Basic Metabolic Panel: Recent Labs  Lab 09/02/17 1815 09/03/17 0445 09/04/17 0435 09/05/17 0440 09/05/17 0454 09/06/17 0419  NA 133* 137 137  --  142 143  K 3.2* 2.5* 3.0*  --  3.1* 4.2  CL 88* 91* 96*  --  101 103  CO2 31 34* 30  --  29 31  GLUCOSE 145* 129* 150*  --  134* 123*  BUN 10 10 14   --  14 18  CREATININE 1.11* 1.04* 1.13*  --  0.93 0.94  CALCIUM 8.9 9.3 8.8*  --  9.3 8.9  MG 1.8 2.0  --  2.1  --   --   PHOS 4.1 3.8  --   --   --   --    GFR: Estimated Creatinine Clearance: 64.7 mL/min (by C-G formula based on SCr of 0.94 mg/dL). Liver Function Tests: Recent Labs  Lab 09/02/17 1815 09/03/17 0445 09/04/17 0435 09/05/17 0454  AST 41 21 21 26   ALT 18 19 18 18   ALKPHOS 74 69 57 58  BILITOT 2.1* 1.4* 0.7 0.6  PROT 7.6 7.0 6.2* 6.6  ALBUMIN 4.1 3.8 3.2* 3.5   No results for input(s): LIPASE, AMYLASE in the last 168 hours. No results for input(s): AMMONIA in the last 168 hours. Coagulation Profile: No results for input(s): INR, PROTIME in the last 168 hours. Cardiac Enzymes: No results for input(s): CKTOTAL, CKMB, CKMBINDEX, TROPONINI in the last 168 hours. BNP (last 3 results) No results for input(s): PROBNP in the last 8760 hours. HbA1C: No results for input(s):  HGBA1C in the last 72 hours. CBG: Recent Labs  Lab 09/05/17 1129 09/05/17 1657 09/05/17 2144 09/06/17 0736 09/06/17 1213  GLUCAP 133* 194* 175* 107* 164*   Lipid Profile: No results for input(s): CHOL, HDL, LDLCALC, TRIG, CHOLHDL, LDLDIRECT in the last 72 hours. Thyroid Function Tests: No results for input(s): TSH, T4TOTAL, FREET4, T3FREE, THYROIDAB in the last 72 hours. Anemia Panel: No results for input(s): VITAMINB12, FOLATE, FERRITIN, TIBC, IRON, RETICCTPCT in the last 72 hours. Sepsis Labs: Recent Labs  Lab 09/02/17 1645  LATICACIDVEN 1.5    Recent Results (from the past 240 hour(s))  MRSA PCR Screening     Status: Abnormal   Collection Time: 09/03/17 10:10 AM  Result Value Ref Range Status   MRSA by PCR POSITIVE (A) NEGATIVE Final    Comment:  The GeneXpert MRSA Assay (FDA approved for NASAL specimens only), is one component of a comprehensive MRSA colonization surveillance program. It is not intended to diagnose MRSA infection nor to guide or monitor treatment for MRSA infections. CRITICAL RESULT CALLED TO, READ BACK BY AND VERIFIED WITH: TORRES,D RN [phone] [place] Performed at [facility], [place] [address], [place], [place] [address]          Radiology Studies: No results found.      Scheduled Meds: . Chlorhexidine Gluconate Cloth  6 each Topical Q0600  . clonazePAM  0.5 mg Oral BID  . DULoxetine  60 mg Oral BID  . enoxaparin (LOVENOX) injection  40 mg Subcutaneous Daily  . furosemide  20 mg Oral Daily  . gabapentin  800 mg Oral TID  . insulin aspart  0-5 Units Subcutaneous QHS  . insulin aspart  0-9 Units Subcutaneous TID WC  . methadone  5 mg Oral BID  . metoprolol tartrate  25 mg Oral BID  . multivitamin with minerals  1 tablet Oral Daily  . mupirocin ointment  1 application Nasal BID  . nystatin   Topical TID  . pantoprazole  40 mg Oral BID  . predniSONE  10 mg Oral Q breakfast  . rOPINIRole  2 mg  Oral QHS  . senna-docusate  2 tablet Oral BID  . sodium chloride flush  3 mL Intravenous Q12H  . spironolactone  50 mg Oral Daily  . traZODone  50 mg Oral QHS   Continuous Infusions: . sodium chloride 10 mL/hr at 09/06/17 0300  . clindamycin (CLEOCIN) IV 600 mg (09/06/17 0538)     LOS: 4 days    Time spent: 25 mins.More than 50% of that time was spent in counseling and/or coordination of care.      Shelly Coss, MD [facility] Pager [phone]  If 7PM-7AM, please contact night-coverage [email or web address] Password [email or web address] 09/06/2017, 1:10 PM

## 2017-09-06 NOTE — NC FL2 (Addendum)
[place] LEVEL OF CARE SCREENING TOOL     IDENTIFICATION  Patient Name: [name] Carr Birthdate: 1955-02-19 Sex: female Admission Date (Current Location): 09/02/2017  [facility] and [place] Number:  [place] and Address:  [facility],  [place] [address] [place]      Provider Number: [id number]  Attending Physician Name and Address:  Shelly Coss, MD  Relative Name and Phone Number:       Current Level of Care: Hospital Recommended Level of Care: [place] Prior Approval Number:    Date Approved/Denied:   PASRR Number: pending   Discharge Plan: SNF    Current Diagnoses: Patient Active Problem List   Diagnosis Date Noted  . Cellulitis of left leg 05/06/2016  . Wounds, multiple open, lower extremity, left, subsequent encounter 05/06/2016  . Addison's disease ([place]) 05/06/2016  . Wounds, multiple open, lower extremity 09/24/2015  . Multiple open wounds of right lower extremity 09/24/2015  . Wound of upper extremity 09/24/2015  . Sepsis ([place]) 09/21/2015  . Abnormal brain MRI   . At risk for falls   . Bacteremia   . Cephalgia   . Chronic migraine w/o aura w/o status migrainosus, not intractable   . Disease of thyroid gland   . Fibromyalgia   . Leukocytosis   . Lumbar degenerative disc disease   . Lumbar radiculopathy   . Lupus erythematosus   . MRSA (methicillin resistant staph aureus) culture positive   . Myoclonus   . Narcotic abuse ([place])   . Opioid dependence ([place])   . Peripheral neuropathy   . Therapeutic drug monitoring   . Fever   . Somnolence 05/25/2014  . Hypotension 05/25/2014  . FUO (fever of unknown origin)   . Anemia 05/24/2014  . Thigh abscess 05/23/2014  . Abscess   . MRSA infection   . Munchausen syndrome   . Malingering   . 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 Drug-seeking behavior   . Infection  05/09/2014  . Abscess of left thigh 05/08/2014  . Diabetes mellitus type 2, controlled ([place]) 05/08/2014  . 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 Neuropathy 08/28/2013  . Hypertension   . Lower extremity cellulitis 08/22/2013  . Hypokalemia 08/22/2013  . Syncope 01/11/2012  . Hyponatremia 12/17/2011  . Cellulitis 02/09/2011  . SLE-on prednisone-Follow at [facility] 02/05/2011  . DVT L IJ 12/26/10 02/04/2011  . Normocytic anemia 12/27/2010  . Malignant neoplasm of female breast ([place]) 09/07/2009  . Type 2 diabetes mellitus ([place]) 09/07/2009  . HYPERLIPIDEMIA 09/07/2009  . Iron deficiency anemia 09/07/2009  . Chronic pain syndrome 09/07/2009  . MIGRAINE HEADACHE 09/07/2009  . ALLERGIC RHINITIS 09/07/2009  . GERD 09/07/2009  . SEIZURE DISORDER 09/07/2009  . PERSONALITY DISORDER 09/01/2009  . Major depression, recurrent, chronic ([place]) 09/01/2009  . RENAL INSUFFICIENCY 09/01/2009    Orientation RESPIRATION BLADDER Height & Weight     Self, Time, Situation, Place  Normal Continent Weight: 182 lb 15.7 oz (83 kg) Height:  5\' 4"  (162.6 cm)  BEHAVIORAL SYMPTOMS/MOOD NEUROLOGICAL BOWEL NUTRITION STATUS      Continent Diet(Carb Modified. )  AMBULATORY STATUS COMMUNICATION OF NEEDS Skin   Extensive Assist  Unclear etiology, multiple scattered puncture like wounds, full thickness over the pretibial area but do not appear venous.  Right second toe with full thickness open wound Left thigh with full thickness wound,   Change both the pretibial/thigh  and toe wounds daily.                  Personal Care Assistance Level of Assistance  Bathing, Feeding, Dressing Bathing Assistance: Limited assistance Feeding assistance: Independent Dressing Assistance: Limited assistance     Functional Limitations Info  Sight, Hearing, Speech Sight Info: Adequate Hearing Info: Adequate Speech Info: Adequate    SPECIAL CARE FACTORS FREQUENCY  PT (By licensed PT), OT (By licensed OT)     PT  Frequency: 5x/week OT Frequency: 5x/week            Contractures Contractures Info: Not present    Additional Factors Info  Code Status, Allergies, Psychotropic, Insulin Sliding Scale Code Status Info: Fullcode  Allergies Info:  Ammonia, Bee Venom, Contrast Media Iodinated Diagnostic Agents, Infliximab, Iohexol, Methotrexate, Midazolam Hcl, Nalbuphine, Pentazocine, Hydroxychloroquine, Ketorolac Tromethamine, Morphine And Related, Nsaids, Penicillins, Rofecoxib, Tetracyclines & Related, Celecoxib, Midazolam Psychotropic Info: Trazadone, Prenidsone, methadone, klonopin Insulin Sliding Scale Info: yes, see med list        Current Medications (09/06/2017):  This is the current hospital active medication list Current Facility-Administered Medications  Medication Dose Route Frequency Provider Last Rate Last Dose  . 0.9 %  sodium chloride infusion  250 mL Intravenous PRN Toy Baker, MD 10 mL/hr at 09/06/17 0300    . acetaminophen (TYLENOL) tablet 650 mg  650 mg Oral Q6H PRN Doutova, Anastassia, MD       Or  . acetaminophen (TYLENOL) suppository 650 mg  650 mg Rectal Q6H PRN Doutova, Anastassia, MD      . Chlorhexidine Gluconate Cloth 2 % PADS 6 each  6 each Topical Q0600 Jani Gravel, MD   6 each at 09/06/17 [phone]  . clindamycin (CLEOCIN) IVPB 600 mg  600 mg Intravenous Q8H Doutova, Anastassia, MD 100 mL/hr at 09/06/17 0538 600 mg at 09/06/17 0538  . clonazePAM (KLONOPIN) tablet 0.5 mg  0.5 mg Oral BID Toy Baker, MD   0.5 mg at 09/06/17 0932  . DULoxetine (CYMBALTA) DR capsule 60 mg  60 mg Oral BID Toy Baker, MD   60 mg at 09/06/17 0945  . enoxaparin (LOVENOX) injection 40 mg  40 mg Subcutaneous Daily Doutova, Anastassia, MD   40 mg at 09/06/17 0932  . furosemide (LASIX) tablet 20 mg  20 mg Oral Daily Doutova, Anastassia, MD   20 mg at 09/06/17 0932  . gabapentin (NEURONTIN) capsule 800 mg  800 mg Oral TID Toy Baker, MD   800 mg at 09/06/17 0931  .  HYDROcodone-acetaminophen (NORCO/VICODIN) 5-325 MG per tablet 1-2 tablet  1-2 tablet Oral Q4H PRN Toy Baker, MD   1 tablet at 09/06/17 0838  . insulin aspart (novoLOG) injection 0-5 Units  0-5 Units Subcutaneous QHS Doutova, Anastassia, MD      . insulin aspart (novoLOG) injection 0-9 Units  0-9 Units Subcutaneous TID WC Toy Baker, MD   2 Units at 09/05/17 1710  . methadone (DOLOPHINE) tablet 5 mg  5 mg Oral BID Toy Baker, MD   5 mg at 09/06/17 0932  . metoprolol tartrate (LOPRESSOR) tablet 25 mg  25 mg Oral BID Toy Baker, MD   25 mg at 09/06/17 0932  . multivitamin with minerals tablet 1 tablet  1 tablet Oral Daily Toy Baker, MD   1 tablet at 09/05/17 1054  . mupirocin ointment (BACTROBAN) 2 % 1 application  1 application Nasal BID Jani Gravel, MD   1 application at 34/74/25 0932  . nystatin (MYCOSTATIN/NYSTOP) topical powder  Topical TID Toy Baker, MD      . ondansetron (ZOFRAN) tablet 4 mg  4 mg Oral Q6H PRN Toy Baker, MD   4 mg at 09/06/17 0048   Or  . ondansetron (ZOFRAN) injection 4 mg  4 mg Intravenous Q6H PRN Toy Baker, MD   4 mg at 09/06/17 0931  . pantoprazole (PROTONIX) EC tablet 40 mg  40 mg Oral BID Toy Baker, MD   40 mg at 09/06/17 0932  . predniSONE (DELTASONE) tablet 10 mg  10 mg Oral Q breakfast Doutova, Anastassia, MD   10 mg at 09/06/17 3154  . promethazine (PHENERGAN) injection 12.5 mg  12.5 mg Intravenous Q6H PRN Blount, Scarlette Shorts T, NP      . rOPINIRole (REQUIP) tablet 2 mg  2 mg Oral QHS Toy Baker, MD   2 mg at 09/05/17 2112  . senna-docusate (Senokot-S) tablet 2 tablet  2 tablet Oral BID Toy Baker, MD   2 tablet at 09/06/17 0931  . sodium chloride flush (NS) 0.9 % injection 3 mL  3 mL Intravenous Q12H Doutova, Anastassia, MD   3 mL at 09/06/17 0946  . sodium chloride flush (NS) 0.9 % injection 3 mL  3 mL Intravenous PRN Doutova, Anastassia, MD      . spironolactone  (ALDACTONE) tablet 50 mg  50 mg Oral Daily Doutova, Anastassia, MD   50 mg at 09/06/17 0931  . traZODone (DESYREL) tablet 50 mg  50 mg Oral QHS Toy Baker, MD   50 mg at 09/05/17 2112     Discharge Medications: Please see discharge summary for a list of discharge medications.  Relevant Imaging Results:  Relevant Lab Results:   Additional Information ssn: [id number]  Lia Hopping, LCSW

## 2017-09-06 NOTE — Consult Note (Signed)
WOC consult completed 09/04/17, please see consult notes and orders.  [place], [place], [place]

## 2017-09-06 NOTE — Progress Notes (Signed)
Physical Therapy Treatment Patient Details Name: [name] Carr MRN: [id number] DOB: Dec 27, 1955 Today's Date: 09/06/2017    History of Present Illness 62yo female presenting with redness of B LEs . Recent history of admissions to [facility] and  [facility] on 08/07/17 for cellulitus. PMH Addisons, anxiety, hx DVTs, hx metastatic breast CA, chronic pain syndrome, DDD, DJD, DM, fibromyalgia, HTN, hypothyroidism, lupus, Lyme disease, Munchausen syndrome, narcotics abuse, neuropathy, opiod dependency, personality disorder, RA, hx seizures, PICC.    PT Comments    The patient reports that she is not able to ambulate due to needing more pain medication. Assisted  To bathroom using RW. Continue PT.   SNF     Equipment Recommendations  Other (comment)    Recommendations for Other Services       Precautions / Restrictions Precautions Precautions: Fall Precaution Comments: multiple LE wounds including on right second toe  Restrictions Weight Bearing Restrictions: No    Mobility  Bed Mobility Overal bed mobility: Independent                Transfers   Equipment used: Rolling walker (2 wheeled) Transfers: Sit to/from Stand Sit to Stand: Supervision            Ambulation/Gait Ambulation/Gait assistance: Min guard Gait Distance (Feet): 20 Feet Assistive device: Rolling walker (2 wheeled)       General Gait Details: to bathroom, limited due to C/O PAIN RIGHT FOOT/TOE .   Patient. LEFT ON TOILET, TO CALL for assistance    Stairs             Wheelchair Mobility    Modified Rankin (Stroke Patients Only)       Balance                                            Cognition Arousal/Alertness: Awake/alert                                            Exercises      General Comments        Pertinent Vitals/Pain Pain Score: 7  Pain Location: wound locations  Pain Descriptors / Indicators: Aching;Sore Pain  Intervention(s): Monitored during session;Premedicated before session;Limited activity within patient's tolerance    Home Living                      Prior Function            PT Goals (current goals can now be found in the care plan section) Progress towards PT goals: Progressing toward goals    Frequency    Min 2X/week      PT Plan Current plan remains appropriate    Co-evaluation              AM-PAC PT "6 Clicks" Daily Activity  Outcome Measure  Difficulty turning over in bed (including adjusting bedclothes, sheets and blankets)?: None Difficulty moving from lying on back to sitting on the side of the bed? : None Difficulty sitting down on and standing up from a chair with arms (e.g., wheelchair, bedside commode, etc,.)?: A Little Help needed moving to and from a bed to chair (including a wheelchair)?: A Little Help needed walking in hospital room?: A  Little Help needed climbing 3-5 steps with a railing? : A Lot 6 Click Score: 19    End of Session   Activity Tolerance: Patient limited by pain Patient left: (IN br WITH CALL BELL) Nurse Communication: Mobility status PT Visit Diagnosis: Unsteadiness on feet (R26.81);Muscle weakness (generalized) (M62.81);Difficulty in walking, not elsewhere classified (R26.2);History of falling (Z91.81)     Time: 1991-4445 PT Time Calculation (min) (ACUTE ONLY): 25 min  Charges:  $Gait Training: 8-22 mins $Self Care/Home Management: 8-22                     [facility] PT [phone]    [name] 09/06/2017, 10:57 AM

## 2017-09-07 LAB — GLUCOSE, CAPILLARY
GLUCOSE-CAPILLARY: 132 mg/dL — AB (ref 70–99)
Glucose-Capillary: 123 mg/dL — ABNORMAL HIGH (ref 70–99)
Glucose-Capillary: 156 mg/dL — ABNORMAL HIGH (ref 70–99)

## 2017-09-07 MED ORDER — CLONAZEPAM 0.5 MG PO TABS: 0.5000 mg | ORAL_TABLET | Freq: Two times a day (BID) | ORAL | 0 refills | Status: DC

## 2017-09-07 MED ORDER — METHADONE HCL 5 MG PO TABS: 5.0000 mg | ORAL_TABLET | Freq: Two times a day (BID) | ORAL | 0 refills | Status: DC

## 2017-09-07 MED ORDER — CLINDAMYCIN HCL 300 MG PO CAPS: 450.0000 mg | ORAL_CAPSULE | Freq: Three times a day (TID) | ORAL | Status: DC

## 2017-09-07 MED ORDER — CLINDAMYCIN HCL 300 MG PO CAPS
600.0000 mg | ORAL_CAPSULE | Freq: Three times a day (TID) | ORAL | Status: DC
  Administered 2017-09-07: 600 mg via ORAL
  Filled 2017-09-07 (×3): qty 2

## 2017-09-07 MED ORDER — CLINDAMYCIN HCL 300 MG PO CAPS: 600.0000 mg | ORAL_CAPSULE | Freq: Three times a day (TID) | ORAL | 0 refills | Status: AC

## 2017-09-07 NOTE — Progress Notes (Signed)
Pt left via PTAR at 2013. Pt stable, alert and oriented.

## 2017-09-07 NOTE — Progress Notes (Signed)
Patient was accepted to [facility]. Authorization was received. Facility ready to admit today.   Patient is now refusing to go to SNF due to "home issues."   CSW informed SNF the patient will no longer admit.   Physician and Case Manager aware.    Kathrin Greathouse, Marlinda Mike, MSW Clinical Social Worker  [phone] 09/07/2017  3:09 PM

## 2017-09-07 NOTE — Progress Notes (Signed)
Occupational Therapy Treatment Patient Details Name: [name] Carr MRN: [id number] DOB: 1956-01-14 Today's Date: 09/07/2017    History of present illness 62yo female presenting with redness of B LEs . Recent history of admissions to [facility] and  [facility] on 08/07/17 for cellulitus. PMH Addisons, anxiety, hx DVTs, hx metastatic breast CA, chronic pain syndrome, DDD, DJD, DM, fibromyalgia, HTN, hypothyroidism, lupus, Lyme disease, Munchausen syndrome, narcotics abuse, neuropathy, opiod dependency, personality disorder, RA, hx seizures, PICC.   OT comments  Pt is set up for SNF but now states she has to go home. She has a number of issues at home and feel snf is her safest d/c option.  Pt is not agreeable to snf at this time.     Follow Up Recommendations  SNF(if pt refuses, then [facility])    Equipment Recommendations  None recommended by OT    Recommendations for Other Services      Precautions / Restrictions Precautions Precautions: Fall Precaution Comments: multiple LE wounds including on right second toe  Restrictions Weight Bearing Restrictions: No       Mobility Bed Mobility               General bed mobility comments: pt sitting EOB  Transfers   Equipment used: Straight cane   Sit to Stand: Min guard         General transfer comment: pt states she has a walker at home but prefers cane    Balance                                           ADL either performed or assessed with clinical judgement   ADL                                         General ADL Comments: ambulated to bathroom with min guard. Stood from high surface at min guard level.  Pt was set to go to SNF and now states she will go home. She has a number of problems at home:  a/c not working, no groceries, car battery dead and she doesn't have anyone to get her groceries.  She is also concerned about her dog.  Recommended that pt go to snf to regain  strength, but she states she cannot. She has an aide x 7 days a week.  As medicaid is her secondary insurance, I've given her a reacher to retrieve things that drop. She is able to manage this with her R hand despite thumb deformity     Vision       Perception     Praxis      Cognition Arousal/Alertness: Awake/alert Behavior During Therapy: WFL for tasks assessed/performed Overall Cognitive Status: Within Functional Limits for tasks assessed                                          Exercises     Shoulder Instructions       General Comments      Pertinent Vitals/ Pain       Pain Score: 8  Pain Location: sciatica on right Pain Descriptors / Indicators: Aching Pain Intervention(s): Limited activity within patient's  tolerance;Monitored during session;Repositioned  Home Living                                          Prior Functioning/Environment              Frequency  Min 2X/week        Progress Toward Goals  OT Goals(current goals can now be found in the care plan section)  Progress towards OT goals: Progressing toward goals  Acute Rehab OT Goals Potential to Achieve Goals: Good  Plan Discharge plan remains appropriate    Co-evaluation                 AM-PAC PT "6 Clicks" Daily Activity     Outcome Measure   Help from another person eating meals?: A Little Help from another person taking care of personal grooming?: A Little Help from another person toileting, which includes using toliet, bedpan, or urinal?: A Lot Help from another person bathing (including washing, rinsing, drying)?: A Little Help from another person to put on and taking off regular upper body clothing?: A Little Help from another person to put on and taking off regular lower body clothing?: A Lot 6 Click Score: 16    End of Session    OT Visit Diagnosis: Unsteadiness on feet (R26.81);Other abnormalities of gait and mobility  (R26.89);Muscle weakness (generalized) (M62.81)   Activity Tolerance Patient tolerated treatment well   Patient Left in bed;with call bell/phone within reach   Nurse Communication          Time: 1740-8144 OT Time Calculation (min): 11 min  Charges: OT General Charges $OT Visit: 1 Visit OT Treatments $Self Care/Home Management : 8-22 mins  Lesle Chris, OTR/L [phone] 09/07/2017   [name] 09/07/2017, 1:41 PM

## 2017-09-07 NOTE — Progress Notes (Addendum)
Per conversation with pt at bedside she is declining SNF due to "too many issues at home". Pt states she is currently using [facility] home health services and would like to resume home care. This CM contacted [facility] rep for resumption referral. MD made aware of discussion with pt and need for home health orders. This CM also made pt a wound care appointment at the [facility] per pt and MD request. Appointment placed on AVS.  Marney Doctor RN,BSN,NCM [phone]

## 2017-09-07 NOTE — Care Management Important Message (Signed)
Important Message  Patient Details  Name: ONETA SIGMAN MRN: [id number] Date of Birth: Sep 07, 1955   Medicare Important Message Given:  Yes    Kerin Salen 09/07/2017, 10:52 AMImportant Message  Patient Details  Name: ZEYNAB KLETT MRN: [id number] Date of Birth: [birth year]   Medicare Important Message Given:  Yes    Kerin Salen 09/07/2017, 10:52 AM

## 2017-09-07 NOTE — Discharge Summary (Addendum)
Physician Discharge Summary  [name] Carr TKP:[id number] DOB: 01/09/1956 DOA: 09/02/2017  PCP: [name], MD  Admit date: 09/02/2017 Discharge date: 09/07/2017  Admitted From: Home Disposition: Home  Discharge Condition:Stable CODE STATUS:FULL Diet recommendation: Heart Healthy  Brief/Interim Summary:  Patient is a 62 year old female with past medical history of metastatic breast cancer in remission, right lower lobe pulmonary nodule, chronic cellulitis of bilateral lower extremities, SLE on chronic steroids, history of MRSA, diabetes type 2, recurrent thigh abscess since 2014 who presented with redness of bilateral lower extremities worse from her baseline.  Patient reported that she came for  IV antibiotics because she cannot tolerate oral medication at home .She has long history of refusal to take oral antibiotics.  She has frequent admissions to the hospital usually at [facility].  She follows with ID over there.  She has history of drug abuse,PICC line abuse.  She persistently asks for PICC line placement on discharge .Patient's last admission was at [facility] on June 3 . ID was consulted at that time and she was recommended to be discharged on oral clindamycin. Patient was started on IV clindamycin upon admission here.  Wound care evaluated the patient.  Patient's bilateral lower extremity wounds  gradually improved.  Physical therapy also evaluated her and recommended to go to skilled nursing facility on discharge but she declined.  We will change antibiotics to oral today.  She is stable enough to be discharged to home with home health today.  Following problems were addressed during hospitalization:  Bilateral lower extremity cellulitis/recurrent abscesses: History of MRSA.  History of drug-seeking behavior and PICC line abuse. She was on IV clindamycin.  She was recently discharged from [facility] on oral clindamycin.  Currently she is afebrile.   Her white cell counts are stable.   Wound care was following.  She is being discharged to skilled nursing facility today.  She can follow-up with wound care and infectious disease as an outpatient.  Hypokalemia: Supplemented.  Magnesium level normal.  Chronic pain syndrome: Continue gabapentin.  On methadone.  Patient has long history of drug-seeking behavior.Avoid IV /PO narcotics.  History of hypertension: Currently normotensive.  Continue her home spironolactone Lasix and metoprolol   History of lupus: On prednisone 10 mg daily.  Follows at [facility]..  Diabetes type 2: Continue sliding scale insulin.  History of depression/anxiety: Continue Cymbalta, clonazepam  Restless leg syndrome: Continue ropinirole  GERD: Continue PPI   Discharge Diagnoses:  Active Problems:   Chronic pain syndrome   Cellulitis   Hyponatremia   Lower extremity cellulitis   Hypokalemia   Hypertension   Diabetes mellitus type 2, controlled ([place])   Drug-seeking behavior   Lupus erythematosus   Opioid dependence ([place])   Wounds, multiple open, lower extremity   Addison's disease [facility])    Discharge Instructions  Discharge Instructions    AMB referral to wound care center   Complete by:  As directed    2 weeks   Diet - low sodium heart healthy   Complete by:  As directed    Discharge instructions   Complete by:  As directed    1) Follow up with infectious disease as an outpatient. Name and  Number of the provider has been attached. 2) Take prescribed medications as instructed.   Increase activity slowly   Complete by:  As directed      Allergies as of 09/07/2017      Reactions   Ammonia Other (See  Comments), Shortness Of Breath   Other reaction(s): ASTHMA Ended up on ventilator from ammonia tabs   Bee Venom Anaphylaxis   Contrast Media [iodinated Diagnostic Agents] Other (See Comments)   Other reaction(s): DIFFICULTY BREATHING Doesn't breath well.   Infliximab Nausea And  Vomiting, Swelling   Other reaction(s): ANAPHYLAXIS   Iohexol Other (See Comments)   SOB   Methotrexate Anaphylaxis, Swelling   Midazolam Hcl Anaphylaxis   Nalbuphine Other (See Comments), Anaphylaxis   Can't breathe well Other reaction(s): ANAPHYLAXIS   Pentazocine Anaphylaxis   Hydroxychloroquine    Ketorolac Tromethamine Other (See Comments)   Injectable doesn't work and pill hurts stomach.   Morphine And Related Hives   Nsaids Nausea And Vomiting   Penicillins    Has patient had a PCN reaction causing immediate rash, facial/tongue/throat swelling, SOB or lightheadedness with hypotension: No Has patient had a PCN reaction causing severe rash involving mucus membranes or skin necrosis: No Has patient had a PCN reaction that required hospitalization: No Has patient had a PCN reaction occurring within the last 10 years: No If all of the above answers are "NO", then may proceed with Cephalosporin use. Tolerates cephalosporins   Rofecoxib    Tetracyclines & Related    Other reaction(s): ANAPHYLAXIS   Celecoxib Rash   Other reaction(s): GI Upset (intolerance)   Midazolam Anxiety   Other reaction(s): CAUSES ANXIETY      Medication List    STOP taking these medications   HYDROcodone-acetaminophen 5-325 MG tablet Commonly known as:  NORCO/VICODIN     TAKE these medications   clindamycin 300 MG capsule Commonly known as:  CLEOCIN Take 2 capsules (600 mg total) by mouth every 8 (eight) hours for 4 days.   clonazePAM 0.5 MG tablet Commonly known as:  KLONOPIN Take 1 tablet (0.5 mg total) by mouth 2 (two) times daily.   DULoxetine 60 MG capsule Commonly known as:  CYMBALTA Take 1 capsule (60 mg total) by mouth 2 (two) times daily.   furosemide 20 MG tablet Commonly known as:  LASIX Take 20 mg by mouth daily.   gabapentin 800 MG tablet Commonly known as:  NEURONTIN Take 800 mg by mouth 3 (three) times daily.   hydrochlorothiazide 25 MG tablet Commonly known as:   HYDRODIURIL Take 25 mg by mouth daily.   methadone 5 MG tablet Commonly known as:  DOLOPHINE Take 1 tablet (5 mg total) by mouth 2 (two) times daily.   metoprolol tartrate 25 MG tablet Commonly known as:  LOPRESSOR Take 25 mg by mouth 2 (two) times daily.   nystatin powder Commonly known as:  MYCOSTATIN/NYSTOP Apply twice daily to affected area   ondansetron 8 MG tablet Commonly known as:  ZOFRAN Take 8 mg by mouth 3 (three) times daily as needed for nausea or vomiting.   pantoprazole 40 MG tablet Commonly known as:  PROTONIX Take 40 mg by mouth 2 (two) times daily.   polyvinyl alcohol 1.4 % ophthalmic solution Commonly known as:  LIQUIFILM TEARS Place 1 drop into both eyes every 2 (two) hours as needed for dry eyes.   potassium chloride 20 MEQ packet Commonly known as:  KLOR-CON Take 20 mEq by mouth 2 (two) times daily.   predniSONE 10 MG tablet Commonly known as:  DELTASONE Take 10 mg by mouth daily with breakfast.   promethazine 25 MG tablet Commonly known as:  PHENERGAN Take 25 mg by mouth every 6 (six) hours as needed for nausea or vomiting.   rOPINIRole 2  MG tablet Commonly known as:  REQUIP Take 2 mg by mouth at bedtime.   senna-docusate 8.6-50 MG tablet Commonly known as:  Senokot-S Take 2 tablets by mouth 2 (two) times daily.   spironolactone 25 MG tablet Commonly known as:  ALDACTONE Take 50 mg by mouth daily. 2 tablets   traZODone 50 MG tablet Commonly known as:  DESYREL TAKE 1 TABLET(50 MG) BY MOUTH AT BEDTIME AS NEEDED FOR SLEEP       Contact information for follow-up providers    [name], MD. Schedule an appointment as soon as possible for a visit in 1 week(s).   Specialty:  Internal Medicine       Alver Fisher, MD. Schedule an appointment as soon as possible for a visit in 1 week(s).   Specialty:  Infectious Diseases Contact information: [address] [place] [address] [phone]             Contact information for after-discharge care    Destination    [facility] .   Service:  Skilled Nursing Contact information: [place] [phone]                 Allergies  Allergen Reactions  . Ammonia Other (See Comments) and Shortness Of Breath    Other reaction(s): ASTHMA Ended up on ventilator from ammonia tabs  . Bee Venom Anaphylaxis  . Contrast Media [Iodinated Diagnostic Agents] Other (See Comments)    Other reaction(s): DIFFICULTY BREATHING Doesn't breath well.  . Infliximab Nausea And Vomiting and Swelling    Other reaction(s): ANAPHYLAXIS  . Iohexol Other (See Comments)    SOB   . Methotrexate Anaphylaxis and Swelling  . Midazolam Hcl Anaphylaxis  . Nalbuphine Other (See Comments) and Anaphylaxis    Can't breathe well Other reaction(s): ANAPHYLAXIS  . Pentazocine Anaphylaxis  . Hydroxychloroquine   . Ketorolac Tromethamine Other (See Comments)    Injectable doesn't work and pill hurts stomach.  . Morphine And Related Hives  . Nsaids Nausea And Vomiting  . Penicillins     Has patient had a PCN reaction causing immediate rash, facial/tongue/throat swelling, SOB or lightheadedness with hypotension: No Has patient had a PCN reaction causing severe rash involving mucus membranes or skin necrosis: No Has patient had a PCN reaction that required hospitalization: No Has patient had a PCN reaction occurring within the last 10 years: No If all of the above answers are "NO", then may proceed with Cephalosporin use.  Tolerates cephalosporins  . Rofecoxib   . Tetracyclines & Related     Other reaction(s): ANAPHYLAXIS  . Celecoxib Rash    Other reaction(s): GI Upset (intolerance)  . Midazolam Anxiety    Other reaction(s): CAUSES ANXIETY    Consultations: None  Procedures/Studies: Ct Chest Wo Contrast  Result Date: 08/24/2017 CLINICAL DATA:  Follow-up left lung nodule. History of  breast cancer. EXAM: CT CHEST WITHOUT CONTRAST TECHNIQUE: Multidetector CT imaging of the chest was performed following the standard protocol without IV contrast. COMPARISON:  07/27/2016 FINDINGS: Cardiovascular: The heart size appears within normal limits. Aortic atherosclerosis identified. Calcification in the LAD coronary artery noted. Mediastinum/Nodes: The trachea appears patent and is midline. Normal appearance of the thyroid gland. Normal appearance of the esophagus. No supraclavicular or axillary adenopathy. No mediastinal or hilar adenopathy. Lungs/Pleura: No pleural effusion, airspace consolidation, or atelectasis. Well-circumscribed solid nodule in the right lower lobe measures 1 cm, image 76/3. unchanged from comparison  exam. No new or enlarging pulmonary nodules or masses identified. Upper Abdomen: No acute abnormality. Musculoskeletal: Mild scoliosis and spondylosis noted. Chronic right posterior tenth rib fracture. Healed fracture involving the proximal body of sternum. IMPRESSION: 1. Stable right lower lobe solid nodule compared with 07/27/2016. Given perceived increase in size from abdominal CT from 2016 continued interval follow-up is recommended in order to establish 24 months of stability (from 07/27/2016). 2. Aortic atherosclerosis and LAD coronary artery atherosclerotic calcifications. Electronically Signed   By: Kerby Moors M.D.   On: 08/24/2017 10:14      Subjective: Patient seen and examined the bedside this morning.  Lower extremity bilateral cellulitis and ulcers are improving.  As always, she insists on staying in the hospital for IV antibiotics.  I have discussed with her extensively that she does not need any more IV antibiotics and she is stable for discharge today with oral antibiotics.  Discharge Exam: Vitals:   09/07/17 0500 09/07/17 1217  BP: 126/68   Pulse: 67 68  Resp: 16   Temp: 97.8 F (36.6 C)   SpO2: 96% 98%   Vitals:   09/06/17 1415 09/06/17 2117  09/07/17 0500 09/07/17 1217  BP: 138/67 (!) 114/46 126/68   Pulse: 63 (!) 58 67 68  Resp: 18 20 16    Temp: 98.8 F (37.1 C) 97.7 F (36.5 C) 97.8 F (36.6 C)   TempSrc: Oral Oral Oral   SpO2: 100% 97% 96% 98%  Weight:      Height:        General: Pt is alert, awake, not in acute distress Cardiovascular: RRR, S1/S2 +, no rubs, no gallops Respiratory: CTA bilaterally, no wheezing, no rhonchi Abdominal: Soft, NT, ND, bowel sounds + Extremities: no edema, no cyanosis, bilatera healing cellulitis/ulcers on bilateral lower extremities    The results of significant diagnostics from this hospitalization (including imaging, microbiology, ancillary and laboratory) are listed below for reference.     Microbiology: Recent Results (from the past 240 hour(s))  MRSA PCR Screening     Status: Abnormal   Collection Time: 09/03/17 10:10 AM  Result Value Ref Range Status   MRSA by PCR POSITIVE (A) NEGATIVE Final    Comment:        The GeneXpert MRSA Assay (FDA approved for NASAL specimens only), is one component of a comprehensive MRSA colonization surveillance program. It is not intended to diagnose MRSA infection nor to guide or monitor treatment for MRSA infections. CRITICAL RESULT CALLED TO, READ BACK BY AND VERIFIED WITH: TORRES,D RN [phone] [place] Performed at [facility], [place] [address] [place], [place] [address]      Labs: BNP (last 3 results) Recent Labs    09/02/17 2205  BNP 27.2   Basic Metabolic Panel: Recent Labs  Lab 09/02/17 1815 09/03/17 0445 09/04/17 0435 09/05/17 0440 09/05/17 0454 09/06/17 0419  NA 133* 137 137  --  142 143  K 3.2* 2.5* 3.0*  --  3.1* 4.2  CL 88* 91* 96*  --  101 103  CO2 31 34* 30  --  29 31  GLUCOSE 145* 129* 150*  --  134* 123*  BUN 10 10 14   --  14 18  CREATININE 1.11* 1.04* 1.13*  --  0.93 0.94  CALCIUM 8.9 9.3 8.8*  --  9.3 8.9  MG 1.8 2.0  --  2.1  --   --   PHOS 4.1 3.8  --   --   --   --  Liver Function Tests: Recent Labs  Lab 09/02/17 1815 09/03/17 0445 09/04/17 0435 09/05/17 0454  AST 41 21 21 26   ALT 18 19 18 18   ALKPHOS 74 69 57 58  BILITOT 2.1* 1.4* 0.7 0.6  PROT 7.6 7.0 6.2* 6.6  ALBUMIN 4.1 3.8 3.2* 3.5   No results for input(s): LIPASE, AMYLASE in the last 168 hours. No results for input(s): AMMONIA in the last 168 hours. CBC: Recent Labs  Lab 09/02/17 1815 09/03/17 0445 09/04/17 0435 09/05/17 0454  WBC 9.0 6.7 5.6 5.8  NEUTROABS 6.8  --   --   --   HGB 12.3 11.8* 11.0* 11.9*  HCT 38.1 37.3 35.0* 38.3  MCV 91.4 90.3 91.9 94.3  PLT 238 227 210 225   Cardiac Enzymes: No results for input(s): CKTOTAL, CKMB, CKMBINDEX, TROPONINI in the last 168 hours. BNP: Invalid input(s): POCBNP CBG: Recent Labs  Lab 09/06/17 1213 09/06/17 1709 09/06/17 2114 09/07/17 0749 09/07/17 1246  GLUCAP 164* 146* 155* 123* 132*   D-Dimer No results for input(s): DDIMER in the last 72 hours. Hgb A1c No results for input(s): HGBA1C in the last 72 hours. Lipid Profile No results for input(s): CHOL, HDL, LDLCALC, TRIG, CHOLHDL, LDLDIRECT in the last 72 hours. Thyroid function studies No results for input(s): TSH, T4TOTAL, T3FREE, THYROIDAB in the last 72 hours.  Invalid input(s): FREET3 Anemia work up No results for input(s): VITAMINB12, FOLATE, FERRITIN, TIBC, IRON, RETICCTPCT in the last 72 hours. Urinalysis    Component Value Date/Time   COLORURINE YELLOW 05/25/2014 1716   APPEARANCEUR CLEAR 05/25/2014 1716   LABSPEC 1.005 05/25/2014 1716   PHURINE 6.5 05/25/2014 1716   GLUCOSEU NEGATIVE 05/25/2014 1716   HGBUR NEGATIVE 05/25/2014 [place] 05/25/2014 1716   BILIRUBINUR neg 07/08/2011 1333   [place] 05/25/2014 1716   PROTEINUR NEGATIVE 05/25/2014 1716   UROBILINOGEN 0.2 05/25/2014 1716   NITRITE NEGATIVE 05/25/2014 1716   LEUKOCYTESUR NEGATIVE 05/25/2014 1716   Sepsis Labs Invalid input(s): PROCALCITONIN,  WBC,   LACTICIDVEN Microbiology Recent Results (from the past 240 hour(s))  MRSA PCR Screening     Status: Abnormal   Collection Time: 09/03/17 10:10 AM  Result Value Ref Range Status   MRSA by PCR POSITIVE (A) NEGATIVE Final    Comment:        The GeneXpert MRSA Assay (FDA approved for NASAL specimens only), is one component of a comprehensive MRSA colonization surveillance program. It is not intended to diagnose MRSA infection nor to guide or monitor treatment for MRSA infections. CRITICAL RESULT CALLED TO, READ BACK BY AND VERIFIED WITH: TORRES,D RN [phone] [place] Performed at [facility], [place] [address] [place], [place] [address]     Please note: You were cared for by a hospitalist during your hospital stay. Once you are discharged, your primary care physician will handle any further medical issues. Please note that NO REFILLS for any discharge medications will be authorized once you are discharged, as it is imperative that you return to your primary care physician (or establish a relationship with a primary care physician if you do not have one) for your post hospital discharge needs so that they can reassess your need for medications and monitor your lab values.    Time coordinating discharge: 40 minutes  SIGNED:   Shelly Coss, MD  [facility] 09/07/2017, 2:34 PM Pager [phone]  If 7PM-7AM, please contact night-coverage [email or web address] Password [email or web address]

## 2017-09-23 ENCOUNTER — Other Ambulatory Visit: Payer: Self-pay | Admitting: Family

## 2017-09-23 DIAGNOSIS — G4701 Insomnia due to medical condition: Secondary | ICD-10-CM

## 2017-09-23 DIAGNOSIS — B372 Candidiasis of skin and nail: Secondary | ICD-10-CM

## 2017-09-23 DIAGNOSIS — R911 Solitary pulmonary nodule: Secondary | ICD-10-CM

## 2017-10-03 ENCOUNTER — Encounter (HOSPITAL_BASED_OUTPATIENT_CLINIC_OR_DEPARTMENT_OTHER): Payer: Medicare Other

## 2017-11-23 ENCOUNTER — Inpatient Hospital Stay: Payer: Medicare Other

## 2017-11-23 ENCOUNTER — Inpatient Hospital Stay: Payer: Medicare Other | Admitting: Family

## 2017-12-25 ENCOUNTER — Other Ambulatory Visit: Payer: Self-pay | Admitting: Family

## 2017-12-25 DIAGNOSIS — G4701 Insomnia due to medical condition: Secondary | ICD-10-CM

## 2017-12-25 DIAGNOSIS — B372 Candidiasis of skin and nail: Secondary | ICD-10-CM

## 2017-12-25 DIAGNOSIS — R911 Solitary pulmonary nodule: Secondary | ICD-10-CM

## 2018-03-08 ENCOUNTER — Encounter: Payer: Self-pay | Admitting: Family

## 2018-03-08 ENCOUNTER — Inpatient Hospital Stay: Payer: Medicare Other

## 2018-03-08 ENCOUNTER — Inpatient Hospital Stay: Payer: Medicare Other | Attending: Family | Admitting: Family

## 2018-03-08 VITALS — BP 121/54 | HR 62 | Temp 99.0°F | Resp 17 | Ht 64.0 in | Wt 170.4 lb

## 2018-03-08 DIAGNOSIS — I7300 Raynaud's syndrome without gangrene: Secondary | ICD-10-CM | POA: Insufficient documentation

## 2018-03-08 DIAGNOSIS — R911 Solitary pulmonary nodule: Secondary | ICD-10-CM | POA: Insufficient documentation

## 2018-03-08 DIAGNOSIS — C50011 Malignant neoplasm of nipple and areola, right female breast: Secondary | ICD-10-CM

## 2018-03-08 DIAGNOSIS — Z853 Personal history of malignant neoplasm of breast: Secondary | ICD-10-CM | POA: Insufficient documentation

## 2018-03-08 LAB — CBC WITH DIFFERENTIAL (CANCER CENTER ONLY)
Abs Immature Granulocytes: 0.08 10*3/uL — ABNORMAL HIGH (ref 0.00–0.07)
Basophils Absolute: 0 10*3/uL (ref 0.0–0.1)
Basophils Relative: 0 %
EOS PCT: 1 %
Eosinophils Absolute: 0.1 10*3/uL (ref 0.0–0.5)
HCT: 34.7 % — ABNORMAL LOW (ref 36.0–46.0)
Hemoglobin: 10.7 g/dL — ABNORMAL LOW (ref 12.0–15.0)
Immature Granulocytes: 1 %
LYMPHS ABS: 0.7 10*3/uL (ref 0.7–4.0)
LYMPHS PCT: 6 %
MCH: 26.1 pg (ref 26.0–34.0)
MCHC: 30.8 g/dL (ref 30.0–36.0)
MCV: 84.6 fL (ref 80.0–100.0)
MONO ABS: 0.8 10*3/uL (ref 0.1–1.0)
MONOS PCT: 7 %
Neutro Abs: 9.6 10*3/uL — ABNORMAL HIGH (ref 1.7–7.7)
Neutrophils Relative %: 85 %
PLATELETS: 252 10*3/uL (ref 150–400)
RBC: 4.1 MIL/uL (ref 3.87–5.11)
RDW: 16.3 % — ABNORMAL HIGH (ref 11.5–15.5)
WBC Count: 11.3 10*3/uL — ABNORMAL HIGH (ref 4.0–10.5)
nRBC: 0 % (ref 0.0–0.2)

## 2018-03-08 LAB — CMP (CANCER CENTER ONLY)
ALT: 12 U/L (ref 0–44)
ANION GAP: 7 (ref 5–15)
AST: 17 U/L (ref 15–41)
Albumin: 4.4 g/dL (ref 3.5–5.0)
Alkaline Phosphatase: 80 U/L (ref 38–126)
BUN: 20 mg/dL (ref 8–23)
CHLORIDE: 96 mmol/L — AB (ref 98–111)
CO2: 34 mmol/L — ABNORMAL HIGH (ref 22–32)
Calcium: 9.5 mg/dL (ref 8.9–10.3)
Creatinine: 1.24 mg/dL — ABNORMAL HIGH (ref 0.44–1.00)
GFR, EST AFRICAN AMERICAN: 54 mL/min — AB (ref >60)
GFR, EST NON AFRICAN AMERICAN: 46 mL/min — AB (ref >60)
Glucose, Bld: 129 mg/dL — ABNORMAL HIGH (ref 70–99)
POTASSIUM: 3.7 mmol/L (ref 3.5–5.1)
Sodium: 137 mmol/L (ref 135–145)
TOTAL PROTEIN: 7 g/dL (ref 6.5–8.1)
Total Bilirubin: 0.7 mg/dL (ref 0.3–1.2)

## 2018-03-08 NOTE — Progress Notes (Signed)
Hematology and Oncology Follow Up Visit  [name] Carr [id number] Nov 16, 1955 63 y.o. 03/08/2018   Principle Diagnosis:  Metastatic breast cancer-remission Right lower lobe pulmonary nodule  Current Therapy:   Observation   Interim History:  [name] Carr is here today for follow-up. She is doing fairly well but had a fall a couple days ago off of a stool. She landed on her back and on her right arm which is red and warm to the touch, swollen at the wrist. She is abld to bend her wrist, wiggle her fingers and grip. She states that she plans to request an xray from her PCP this week when she goes to his [place] office.  She was not scheduled for a CT scan today. Our financial counselor if out of the office and there is no one available to do the PA. I spoke with Dr. Marin Olp and since it is so difficult for her to come in for visits (she is limited on her health transportation to 45 rides a year, it takes 2 each trip), we will repeat in another 6 months.  She has never been a smoker.  CA 27.29 in July 2019 was 26.3. Today's result is stable.  No n/v, cough, rash, dizziness, SOB, chest pain, palpitations, abdominal pain or changes in bowel or bladder habits.  She has occasional urinary incontinence on diuretics.  She will take a stool softener if needed for constipation.  She is on prednisone 10 mg PO daily for her cellulitis. She has several abrasions on her shins. She states that she has occasional low grade fever and chills with the cellulitis. She is followed by Dr. Leane Call at [facility].  She uses her jazzy when out and ambulates with a cane for support.  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 She states that she has Raynaud's.  No lymphadenopathy noted on exam.  No episodes of bleeding, no bruising or petechiae.  She has a good appetite and is staying well hydrated. Her weight is stable.   ECOG Performance Status: 2 - Symptomatic, <50% confined to bed  Medications:    Allergies as of 03/08/2018      Reactions   Ammonia Shortness Of Breath, Other (See Comments)   Other reaction(s): ASTHMA Ended up on ventilator from ammonia tabs   Bee Venom Anaphylaxis   Contrast Media [iodinated Diagnostic Agents] Other (See Comments)   Other reaction(s): DIFFICULTY BREATHING Doesn't breath well.   Infliximab Nausea And Vomiting, Swelling   Other reaction(s): ANAPHYLAXIS   Iohexol Other (See Comments)   SOB   Methotrexate Anaphylaxis, Swelling   Midazolam Hcl Anaphylaxis   Nalbuphine Other (See Comments), Anaphylaxis   Can't breathe well Other reaction(s): ANAPHYLAXIS   Pentazocine Anaphylaxis   Tetracyclines & Related Anaphylaxis   Ketorolac Tromethamine Other (See Comments)   Injectable does work and pills don't work.   Hydroxychloroquine    Morphine And Related Hives   Nsaids Nausea And Vomiting   Penicillins    Has patient had a PCN reaction causing immediate rash, facial/tongue/throat swelling, SOB or lightheadedness with hypotension: No Has patient had a PCN reaction causing severe rash involving mucus membranes or skin necrosis: No Has patient had a PCN reaction that required hospitalization: No Has patient had a PCN reaction occurring within the last 10 years: No If all of the above answers are "NO", then may proceed with Cephalosporin use. Tolerates cephalosporins   Rofecoxib    Celecoxib Rash   Other reaction(s): GI Upset (  intolerance)   Midazolam Anxiety   Other reaction(s): CAUSES ANXIETY      Medication List       Accurate as of March 08, 2018  2:59 PM. Always use your most recent med list.        busPIRone 10 MG tablet Commonly known as:  BUSPAR Take 1 tablet by mouth 2 (two) times daily.   clonazePAM 0.5 MG tablet Commonly known as:  KLONOPIN Take 1 tablet (0.5 mg total) by mouth 2 (two) times daily.   DULoxetine 60 MG capsule Commonly known as:  CYMBALTA Take 1 capsule (60 mg total) by mouth 2 (two) times daily.    furosemide 20 MG tablet Commonly known as:  LASIX Take 20 mg by mouth daily.   gabapentin 800 MG tablet Commonly known as:  NEURONTIN Take 800 mg by mouth 3 (three) times daily.   hydrochlorothiazide 25 MG tablet Commonly known as:  HYDRODIURIL Take 25 mg by mouth daily.   methadone 5 MG tablet Commonly known as:  DOLOPHINE Take 1 tablet (5 mg total) by mouth 2 (two) times daily.   metoprolol tartrate 25 MG tablet Commonly known as:  LOPRESSOR Take 25 mg by mouth 2 (two) times daily.   nystatin powder Commonly known as:  MYCOSTATIN/NYSTOP Apply twice daily to affected area   ondansetron 8 MG tablet Commonly known as:  ZOFRAN Take 8 mg by mouth 3 (three) times daily as needed for nausea or vomiting.   pantoprazole 40 MG tablet Commonly known as:  PROTONIX Take 40 mg by mouth 2 (two) times daily.   polyvinyl alcohol 1.4 % ophthalmic solution Commonly known as:  LIQUIFILM TEARS Place 1 drop into both eyes every 2 (two) hours as needed for dry eyes.   potassium chloride 20 MEQ packet Commonly known as:  KLOR-CON Take 20 mEq by mouth 2 (two) times daily.   predniSONE 10 MG tablet Commonly known as:  DELTASONE Take 10 mg by mouth daily with breakfast.   promethazine 25 MG tablet Commonly known as:  PHENERGAN Take 25 mg by mouth every 6 (six) hours as needed for nausea or vomiting.   rOPINIRole 2 MG tablet Commonly known as:  REQUIP Take 2 mg by mouth at bedtime.   senna-docusate 8.6-50 MG tablet Commonly known as:  Senokot-S Take 2 tablets by mouth 2 (two) times daily.   spironolactone 25 MG tablet Commonly known as:  ALDACTONE Take 50 mg by mouth daily. 2 tablets   traZODone 50 MG tablet Commonly known as:  DESYREL TAKE 1 TABLET(50 MG) BY MOUTH AT BEDTIME AS NEEDED FOR SLEEP       Allergies:  Allergies  Allergen Reactions  . Ammonia Shortness Of Breath and Other (See Comments)    Other reaction(s): ASTHMA Ended up on ventilator from ammonia tabs   . Bee Venom Anaphylaxis  . Contrast Media [Iodinated Diagnostic Agents] Other (See Comments)    Other reaction(s): DIFFICULTY BREATHING Doesn't breath well.  . Infliximab Nausea And Vomiting and Swelling    Other reaction(s): ANAPHYLAXIS  . Iohexol Other (See Comments)    SOB   . Methotrexate Anaphylaxis and Swelling  . Midazolam Hcl Anaphylaxis  . Nalbuphine Other (See Comments) and Anaphylaxis    Can't breathe well Other reaction(s): ANAPHYLAXIS  . Pentazocine Anaphylaxis  . Tetracyclines & Related Anaphylaxis  . Ketorolac Tromethamine Other (See Comments)    Injectable does work and pills don't work.  . Hydroxychloroquine   . Morphine And Related Hives  . Nsaids  Nausea And Vomiting  . Penicillins     Has patient had a PCN reaction causing immediate rash, facial/tongue/throat swelling, SOB or lightheadedness with hypotension: No Has patient had a PCN reaction causing severe rash involving mucus membranes or skin necrosis: No Has patient had a PCN reaction that required hospitalization: No Has patient had a PCN reaction occurring within the last 10 years: No If all of the above answers are "NO", then may proceed with Cephalosporin use.  Tolerates cephalosporins  . Rofecoxib   . Celecoxib Rash    Other reaction(s): GI Upset (intolerance)  . Midazolam Anxiety    Other reaction(s): CAUSES ANXIETY    Past Medical History, Surgical history, Social history, and Family History were reviewed and updated.  Review of Systems: All other 10 point review of systems is negative.   Physical Exam:  height is 5\' 4"  (1.626 m) and weight is 170 lb 6.4 oz (77.3 kg). Her oral temperature is 99 F (37.2 C). Her blood pressure is 121/54 (abnormal) and her pulse is 62. Her respiration is 17 and oxygen saturation is 96%.   Wt Readings from Last 3 Encounters:  03/08/18 170 lb 6.4 oz (77.3 kg)  09/03/17 182 lb 15.7 oz (83 kg)  08/23/17 183 lb 1.9 oz (83.1 kg)    Ocular: Sclerae unicteric,  pupils equal, round and reactive to light Ear-nose-throat: Oropharynx clear, dentition fair Lymphatic: No cervical, supraclavicular or axillary adenopathy Lungs no rales or rhonchi, good excursion bilaterally Heart regular rate and rhythm, no murmur appreciated Abd soft, nontender, positive bowel sounds, no liver or spleen tip palpated on exam, no fluid wave  MSK no focal spinal tenderness, no joint edema Neuro: non-focal, well-oriented, appropriate affect Breasts: No changes.  Lab Results  Component Value Date   WBC 11.3 (H) 03/08/2018   HGB 10.7 (L) 03/08/2018   HCT 34.7 (L) 03/08/2018   MCV 84.6 03/08/2018   PLT 252 03/08/2018   Lab Results  Component Value Date   FERRITIN 336 (H) 05/07/2014   IRON 20 (L) 05/07/2014   TIBC 321 05/07/2014   UIBC 301 05/07/2014   IRONPCTSAT 6 (L) 05/07/2014   Lab Results  Component Value Date   RETICCTPCT 2.19 (H) 09/27/2011   RBC 4.10 03/08/2018   RETICCTABS 88.48 09/27/2011   No results found for: KPAFRELGTCHN, LAMBDASER, KAPLAMBRATIO No results found for: IGGSERUM, IGA, IGMSERUM No results found for: Odetta Pink, SPEI   Chemistry      Component Value Date/Time   NA 137 03/08/2018 1347   NA 138 07/27/2016 1427   NA 135 (L) 04/08/2013 1148   K 3.7 03/08/2018 1347   K 4.4 07/27/2016 1427   K 4.6 04/08/2013 1148   CL 96 (L) 03/08/2018 1347   CL 102 07/27/2016 1427   CL 91 (L) 03/15/2012 1410   CO2 34 (H) 03/08/2018 1347   CO2 27 07/27/2016 1427   CO2 28 04/08/2013 1148   BUN 20 03/08/2018 1347   BUN 13 07/27/2016 1427   BUN 29.9 (H) 04/08/2013 1148   CREATININE 1.24 (H) 03/08/2018 1347   CREATININE 1.0 07/27/2016 1427   CREATININE 1.2 (H) 04/08/2013 1148   GLU 116 05/05/2016      Component Value Date/Time   CALCIUM 9.5 03/08/2018 1347   CALCIUM 9.0 07/27/2016 1427   CALCIUM 9.6 04/08/2013 1148   ALKPHOS 80 03/08/2018 1347   ALKPHOS 62 07/27/2016 1427   ALKPHOS 100  04/08/2013 1148   AST 17  03/08/2018 1347   AST 17 04/08/2013 1148   ALT 12 03/08/2018 1347   ALT 23 07/27/2016 1427   ALT 18 04/08/2013 1148   BILITOT 0.7 03/08/2018 1347   BILITOT 0.34 04/08/2013 1148       Impression and Plan: Ms. Guthridge is a very pleasant 63 yo caucasian female with metastatic breast cancer. She continues to do well from our standpoint.  She has difficulty coming in to see [place] due to limited rides with health transportation.  I spoke with Dr. Marin Olp and we will plan to see her back in another 6 months with CT of the chest that same day.   CA 27.29 is pending.  She will contact our office with any questions or concerns. We can certainly see her sooner if need be.   Laverna Peace, NP 1/30/20202:59 PM

## 2018-03-09 LAB — CANCER ANTIGEN 27.29: CA 27.29: 27.1 U/mL (ref 0.0–38.6)

## 2018-03-09 LAB — LACTATE DEHYDROGENASE: LDH: 229 U/L — AB (ref 98–192)

## 2018-03-24 ENCOUNTER — Other Ambulatory Visit: Payer: Self-pay | Admitting: Family

## 2018-03-24 DIAGNOSIS — R911 Solitary pulmonary nodule: Secondary | ICD-10-CM

## 2018-03-24 DIAGNOSIS — G4701 Insomnia due to medical condition: Secondary | ICD-10-CM

## 2018-03-24 DIAGNOSIS — B372 Candidiasis of skin and nail: Secondary | ICD-10-CM

## 2018-06-24 ENCOUNTER — Other Ambulatory Visit: Payer: Self-pay | Admitting: Family

## 2018-06-24 DIAGNOSIS — R911 Solitary pulmonary nodule: Secondary | ICD-10-CM

## 2018-06-24 DIAGNOSIS — B372 Candidiasis of skin and nail: Secondary | ICD-10-CM

## 2018-06-24 DIAGNOSIS — G4701 Insomnia due to medical condition: Secondary | ICD-10-CM

## 2018-09-06 ENCOUNTER — Encounter: Payer: Self-pay | Admitting: Family

## 2018-09-06 ENCOUNTER — Inpatient Hospital Stay (HOSPITAL_BASED_OUTPATIENT_CLINIC_OR_DEPARTMENT_OTHER): Payer: Medicare Other | Admitting: Family

## 2018-09-06 ENCOUNTER — Ambulatory Visit (HOSPITAL_BASED_OUTPATIENT_CLINIC_OR_DEPARTMENT_OTHER)
Admission: RE | Admit: 2018-09-06 | Discharge: 2018-09-06 | Disposition: A | Discharge status: Home / Self Care | Payer: Medicare Other | Source: Ambulatory Visit | Attending: Family | Admitting: Family

## 2018-09-06 ENCOUNTER — Other Ambulatory Visit: Payer: Self-pay

## 2018-09-06 ENCOUNTER — Inpatient Hospital Stay: Payer: Medicare Other | Attending: Hematology & Oncology

## 2018-09-06 VITALS — BP 122/60 | HR 57 | Temp 97.6°F | Resp 18 | Ht 66.0 in | Wt 159.6 lb

## 2018-09-06 DIAGNOSIS — L03116 Cellulitis of left lower limb: Secondary | ICD-10-CM | POA: Insufficient documentation

## 2018-09-06 DIAGNOSIS — L03115 Cellulitis of right lower limb: Secondary | ICD-10-CM | POA: Insufficient documentation

## 2018-09-06 DIAGNOSIS — R911 Solitary pulmonary nodule: Secondary | ICD-10-CM | POA: Insufficient documentation

## 2018-09-06 DIAGNOSIS — C50011 Malignant neoplasm of nipple and areola, right female breast: Secondary | ICD-10-CM | POA: Diagnosis present

## 2018-09-06 DIAGNOSIS — Z853 Personal history of malignant neoplasm of breast: Secondary | ICD-10-CM | POA: Diagnosis present

## 2018-09-06 LAB — CBC WITH DIFFERENTIAL (CANCER CENTER ONLY)
Abs Immature Granulocytes: 0.03 10*3/uL (ref 0.00–0.07)
Basophils Absolute: 0 10*3/uL (ref 0.0–0.1)
Basophils Relative: 0 %
Eosinophils Absolute: 0 10*3/uL (ref 0.0–0.5)
Eosinophils Relative: 0 %
HCT: 37.2 % (ref 36.0–46.0)
Hemoglobin: 11.5 g/dL — ABNORMAL LOW (ref 12.0–15.0)
Immature Granulocytes: 0 %
Lymphocytes Relative: 4 %
Lymphs Abs: 0.4 10*3/uL — ABNORMAL LOW (ref 0.7–4.0)
MCH: 26.1 pg (ref 26.0–34.0)
MCHC: 30.9 g/dL (ref 30.0–36.0)
MCV: 84.5 fL (ref 80.0–100.0)
Monocytes Absolute: 0.4 10*3/uL (ref 0.1–1.0)
Monocytes Relative: 5 %
Neutro Abs: 8.5 10*3/uL — ABNORMAL HIGH (ref 1.7–7.7)
Neutrophils Relative %: 91 %
Platelet Count: 200 10*3/uL (ref 150–400)
RBC: 4.4 MIL/uL (ref 3.87–5.11)
RDW: 16.7 % — ABNORMAL HIGH (ref 11.5–15.5)
WBC Count: 9.4 10*3/uL (ref 4.0–10.5)
nRBC: 0 % (ref 0.0–0.2)

## 2018-09-06 LAB — CMP (CANCER CENTER ONLY)
ALT: 13 U/L (ref 0–44)
AST: 15 U/L (ref 15–41)
Albumin: 4.3 g/dL (ref 3.5–5.0)
Alkaline Phosphatase: 71 U/L (ref 38–126)
Anion gap: 10 (ref 5–15)
BUN: 16 mg/dL (ref 8–23)
CO2: 33 mmol/L — ABNORMAL HIGH (ref 22–32)
Calcium: 9.1 mg/dL (ref 8.9–10.3)
Chloride: 94 mmol/L — ABNORMAL LOW (ref 98–111)
Creatinine: 0.95 mg/dL (ref 0.44–1.00)
GFR, Est AFR Am: >60 mL/min (ref >60)
GFR, Estimated: >60 mL/min (ref >60)
Glucose, Bld: 152 mg/dL — ABNORMAL HIGH (ref 70–99)
Potassium: 3.4 mmol/L — ABNORMAL LOW (ref 3.5–5.1)
Sodium: 137 mmol/L (ref 135–145)
Total Bilirubin: 1.1 mg/dL (ref 0.3–1.2)
Total Protein: 6.9 g/dL (ref 6.5–8.1)

## 2018-09-06 MED ORDER — HYDROCODONE-ACETAMINOPHEN 5-325 MG PO TABS
1.00 | ORAL_TABLET | ORAL | Status: DC
Start: ? — End: 2018-09-06

## 2018-09-06 NOTE — Progress Notes (Signed)
Hematology and Oncology Follow Up Visit  [name] Carr [id number] Dec 09, 1955 63 y.o. 09/06/2018   Principle Diagnosis:  Metastatic breast cancer Right lower lobe pulmonary nodule  Current Therapy:   Observation   Interim History:  [name] Carr is here today for follow-up. She is currently on Clindamycin for bilateral lower extremity cellulitis. She has multiple healing sores on both legs. She had been wrapping them but currently has them open to air.  CT scan today showed that her right lower lobe nodule is stable. This was also reviewed by Dr. Marin Olp.  CA 27.29 in January was 27. Today's is pending.  No fever, chills, n/v, cough, rash, SOB, chest pain, palpitations, abdominal pain or changes in bowel or bladder habits.  She did have a fall recently where she miss stepped on her ramo and fell on her left shoulder. She has bruising but states that thankfully she did not break anything.  No episodes of bleeding. No petechiae.  No swelling or tenderness in her extremities.  The numbness and tingling in her hands and feet is unchanged.  She is eating well and staying hydrated. Her weight is stable.   ECOG Performance Status: 1 - Symptomatic but completely ambulatory  Medications:  Allergies as of 09/06/2018      Reactions   Ammonia Shortness Of Breath, Other (See Comments)   Other reaction(s): ASTHMA Ended up on ventilator from ammonia tabs   Bee Venom Anaphylaxis   Contrast Media [iodinated Diagnostic Agents] Other (See Comments)   Other reaction(s): DIFFICULTY BREATHING Doesn't breath well.   Infliximab Nausea And Vomiting, Swelling   Other reaction(s): ANAPHYLAXIS   Iohexol Shortness Of Breath      Methotrexate Anaphylaxis, Swelling   Midazolam Hcl Anaphylaxis   Nalbuphine Other (See Comments), Anaphylaxis   Can't breathe well Other reaction(s): ANAPHYLAXIS   Pentazocine Anaphylaxis   Tetracyclines & Related Anaphylaxis   Ketorolac Tromethamine Other (See Comments)   Injectable does work and pills don't work.   Midazolam Anxiety   Morphine And Related Hives   Nsaids Nausea And Vomiting   Hydroxychloroquine    Penicillins    Has patient had a PCN reaction causing immediate rash, facial/tongue/throat swelling, SOB or lightheadedness with hypotension: No Has patient had a PCN reaction causing severe rash involving mucus membranes or skin necrosis: No Has patient had a PCN reaction that required hospitalization: No Has patient had a PCN reaction occurring within the last 10 years: No If all of the above answers are "NO", then may proceed with Cephalosporin use. Tolerates cephalosporins   Rofecoxib    Celecoxib Rash   Other reaction(s): GI Upset (intolerance)      Medication List       Accurate as of September 06, 2018  3:15 PM. If you have any questions, ask your nurse or doctor.        busPIRone 10 MG tablet Commonly known as: BUSPAR Take 1 tablet by mouth 2 (two) times daily.   clonazePAM 0.5 MG tablet Commonly known as: KLONOPIN Take 1 tablet (0.5 mg total) by mouth 2 (two) times daily.   DULoxetine 60 MG capsule Commonly known as: CYMBALTA Take 1 capsule (60 mg total) by mouth 2 (two) times daily.   furosemide 20 MG tablet Commonly known as: LASIX Take 20 mg by mouth daily.   gabapentin 800 MG tablet Commonly known as: NEURONTIN Take 800 mg by mouth 3 (three) times daily.   hydrochlorothiazide 25 MG tablet Commonly known as: HYDRODIURIL Take 25 mg by  mouth daily.   methadone 5 MG tablet Commonly known as: DOLOPHINE Take 1 tablet (5 mg total) by mouth 2 (two) times daily.   metoprolol tartrate 25 MG tablet Commonly known as: LOPRESSOR Take 25 mg by mouth 2 (two) times daily.   nystatin powder Commonly known as: MYCOSTATIN/NYSTOP Apply twice daily to affected area   ondansetron 8 MG tablet Commonly known as: ZOFRAN Take 8 mg by mouth 3 (three) times daily as needed for nausea or vomiting.   pantoprazole 40 MG tablet  Commonly known as: PROTONIX Take 40 mg by mouth 2 (two) times daily.   polyvinyl alcohol 1.4 % ophthalmic solution Commonly known as: LIQUIFILM TEARS Place 1 drop into both eyes every 2 (two) hours as needed for dry eyes.   potassium chloride 20 MEQ packet Commonly known as: KLOR-CON Take 20 mEq by mouth 2 (two) times daily.   predniSONE 10 MG tablet Commonly known as: DELTASONE Take 10 mg by mouth daily with breakfast.   promethazine 25 MG tablet Commonly known as: PHENERGAN Take 25 mg by mouth every 6 (six) hours as needed for nausea or vomiting.   rOPINIRole 2 MG tablet Commonly known as: REQUIP Take 2 mg by mouth at bedtime.   senna-docusate 8.6-50 MG tablet Commonly known as: Senokot-S Take 2 tablets by mouth 2 (two) times daily.   spironolactone 25 MG tablet Commonly known as: ALDACTONE Take 50 mg by mouth daily. 2 tablets   traZODone 50 MG tablet Commonly known as: DESYREL TAKE 1 TABLET(50 MG) BY MOUTH AT BEDTIME AS NEEDED FOR SLEEP       Allergies:  Allergies  Allergen Reactions  . Ammonia Shortness Of Breath and Other (See Comments)    Other reaction(s): ASTHMA Ended up on ventilator from ammonia tabs  . Bee Venom Anaphylaxis  . Contrast Media [Iodinated Diagnostic Agents] Other (See Comments)    Other reaction(s): DIFFICULTY BREATHING Doesn't breath well.  . Infliximab Nausea And Vomiting and Swelling    Other reaction(s): ANAPHYLAXIS  . Iohexol Shortness Of Breath       . Methotrexate Anaphylaxis and Swelling  . Midazolam Hcl Anaphylaxis  . Nalbuphine Other (See Comments) and Anaphylaxis    Can't breathe well Other reaction(s): ANAPHYLAXIS  . Pentazocine Anaphylaxis  . Tetracyclines & Related Anaphylaxis  . Ketorolac Tromethamine Other (See Comments)    Injectable does work and pills don't work.  . Midazolam Anxiety  . Morphine And Related Hives  . Nsaids Nausea And Vomiting  . Hydroxychloroquine   . Penicillins     Has patient had a PCN  reaction causing immediate rash, facial/tongue/throat swelling, SOB or lightheadedness with hypotension: No Has patient had a PCN reaction causing severe rash involving mucus membranes or skin necrosis: No Has patient had a PCN reaction that required hospitalization: No Has patient had a PCN reaction occurring within the last 10 years: No If all of the above answers are "NO", then may proceed with Cephalosporin use.  Tolerates cephalosporins  . Rofecoxib   . Celecoxib Rash    Other reaction(s): GI Upset (intolerance)    Past Medical History, Surgical history, Social history, and Family History were reviewed and updated.  Review of Systems: All other 10 point review of systems is negative.   Physical Exam:  vitals were not taken for this visit.   Wt Readings from Last 3 Encounters:  03/08/18 170 lb 6.4 oz (77.3 kg)  09/03/17 182 lb 15.7 oz (83 kg)  08/23/17 183 lb 1.9 oz (83.1  kg)    Ocular: Sclerae unicteric, pupils equal, round and reactive to light Ear-nose-throat: Oropharynx clear, dentition fair Lymphatic: No cervical or supraclavicular adenopathy Lungs no rales or rhonchi, good excursion bilaterally Heart regular rate and rhythm, no murmur appreciated Abd soft, nontender, positive bowel sounds, no liver or spleen tip palpated on exam, no fluid wave  MSK no focal spinal tenderness, no joint edema Neuro: non-focal, well-oriented, appropriate affect Breasts: Bilateral chest exam negative, no mass, lesion or rash.   Lab Results  Component Value Date   WBC 9.4 09/06/2018   HGB 11.5 (L) 09/06/2018   HCT 37.2 09/06/2018   MCV 84.5 09/06/2018   PLT 200 09/06/2018   Lab Results  Component Value Date   FERRITIN 336 (H) 05/07/2014   IRON 20 (L) 05/07/2014   TIBC 321 05/07/2014   UIBC 301 05/07/2014   IRONPCTSAT 6 (L) 05/07/2014   Lab Results  Component Value Date   RETICCTPCT 2.19 (H) 09/27/2011   RBC 4.40 09/06/2018   RETICCTABS 88.48 09/27/2011   No results found  for: KPAFRELGTCHN, LAMBDASER, KAPLAMBRATIO No results found for: IGGSERUM, IGA, IGMSERUM No results found for: Odetta Pink, SPEI   Chemistry      Component Value Date/Time   NA 137 09/06/2018 1425   NA 138 07/27/2016 1427   NA 135 (L) 04/08/2013 1148   K 3.4 (L) 09/06/2018 1425   K 4.4 07/27/2016 1427   K 4.6 04/08/2013 1148   CL 94 (L) 09/06/2018 1425   CL 102 07/27/2016 1427   CL 91 (L) 03/15/2012 1410   CO2 33 (H) 09/06/2018 1425   CO2 27 07/27/2016 1427   CO2 28 04/08/2013 1148   BUN 16 09/06/2018 1425   BUN 13 07/27/2016 1427   BUN 29.9 (H) 04/08/2013 1148   CREATININE 0.95 09/06/2018 1425   CREATININE 1.0 07/27/2016 1427   CREATININE 1.2 (H) 04/08/2013 1148   GLU 116 05/05/2016      Component Value Date/Time   CALCIUM 9.1 09/06/2018 1425   CALCIUM 9.0 07/27/2016 1427   CALCIUM 9.6 04/08/2013 1148   ALKPHOS 71 09/06/2018 1425   ALKPHOS 62 07/27/2016 1427   ALKPHOS 100 04/08/2013 1148   AST 15 09/06/2018 1425   AST 17 04/08/2013 1148   ALT 13 09/06/2018 1425   ALT 23 07/27/2016 1427   ALT 18 04/08/2013 1148   BILITOT 1.1 09/06/2018 1425   BILITOT 0.34 04/08/2013 1148       Impression and Plan: [name] Carr is a very pleasant 63 yo caucasian female with metastatic breast cancer. The right lower lob nodule was stable on today's CT scan.  We will continue to follow along with her and plan to see her back in another 6 months. We will repeat CT at that time.  She will contact our office with any questions or concerns. We can certainly see her sooner if needed.   Laverna Peace, NP 7/30/20203:15 PM

## 2018-09-07 LAB — CANCER ANTIGEN 27.29: CA 27.29: 27 U/mL (ref 0.0–38.6)

## 2018-09-07 LAB — LACTATE DEHYDROGENASE: LDH: 264 U/L — ABNORMAL HIGH (ref 98–192)

## 2018-09-25 ENCOUNTER — Other Ambulatory Visit: Payer: Self-pay | Admitting: Family

## 2018-09-25 DIAGNOSIS — R911 Solitary pulmonary nodule: Secondary | ICD-10-CM

## 2018-09-25 DIAGNOSIS — B372 Candidiasis of skin and nail: Secondary | ICD-10-CM

## 2018-09-25 DIAGNOSIS — G4701 Insomnia due to medical condition: Secondary | ICD-10-CM

## 2018-12-25 ENCOUNTER — Other Ambulatory Visit: Payer: Self-pay | Admitting: Family

## 2018-12-25 DIAGNOSIS — R911 Solitary pulmonary nodule: Secondary | ICD-10-CM

## 2018-12-25 DIAGNOSIS — C50011 Malignant neoplasm of nipple and areola, right female breast: Secondary | ICD-10-CM

## 2018-12-31 ENCOUNTER — Other Ambulatory Visit: Payer: Self-pay | Admitting: Family

## 2018-12-31 DIAGNOSIS — B372 Candidiasis of skin and nail: Secondary | ICD-10-CM

## 2018-12-31 DIAGNOSIS — G4701 Insomnia due to medical condition: Secondary | ICD-10-CM

## 2018-12-31 DIAGNOSIS — R911 Solitary pulmonary nodule: Secondary | ICD-10-CM

## 2019-03-08 ENCOUNTER — Inpatient Hospital Stay: Payer: Medicare Other | Attending: Hematology & Oncology

## 2019-03-08 ENCOUNTER — Encounter: Payer: Self-pay | Admitting: Hematology & Oncology

## 2019-03-08 ENCOUNTER — Other Ambulatory Visit: Payer: Self-pay

## 2019-03-08 ENCOUNTER — Inpatient Hospital Stay (HOSPITAL_BASED_OUTPATIENT_CLINIC_OR_DEPARTMENT_OTHER): Payer: Medicare Other | Admitting: Hematology & Oncology

## 2019-03-08 ENCOUNTER — Ambulatory Visit (HOSPITAL_BASED_OUTPATIENT_CLINIC_OR_DEPARTMENT_OTHER)
Admission: RE | Admit: 2019-03-08 | Discharge: 2019-03-08 | Disposition: A | Discharge status: Home / Self Care | Payer: Medicare Other | Source: Ambulatory Visit | Attending: Family | Admitting: Family

## 2019-03-08 VITALS — BP 127/65 | HR 57 | Temp 97.3°F | Resp 18 | Wt 155.0 lb

## 2019-03-08 DIAGNOSIS — C50011 Malignant neoplasm of nipple and areola, right female breast: Secondary | ICD-10-CM

## 2019-03-08 DIAGNOSIS — F329 Major depressive disorder, single episode, unspecified: Secondary | ICD-10-CM | POA: Diagnosis not present

## 2019-03-08 DIAGNOSIS — R911 Solitary pulmonary nodule: Secondary | ICD-10-CM | POA: Insufficient documentation

## 2019-03-08 DIAGNOSIS — Z853 Personal history of malignant neoplasm of breast: Secondary | ICD-10-CM | POA: Insufficient documentation

## 2019-03-08 LAB — CBC WITH DIFFERENTIAL (CANCER CENTER ONLY)
Abs Immature Granulocytes: 0.03 10*3/uL (ref 0.00–0.07)
Basophils Absolute: 0 10*3/uL (ref 0.0–0.1)
Basophils Relative: 0 %
Eosinophils Absolute: 0 10*3/uL (ref 0.0–0.5)
Eosinophils Relative: 0 %
HCT: 36.1 % (ref 36.0–46.0)
Hemoglobin: 11.7 g/dL — ABNORMAL LOW (ref 12.0–15.0)
Immature Granulocytes: 0 %
Lymphocytes Relative: 7 %
Lymphs Abs: 0.6 10*3/uL — ABNORMAL LOW (ref 0.7–4.0)
MCH: 28.1 pg (ref 26.0–34.0)
MCHC: 32.4 g/dL (ref 30.0–36.0)
MCV: 86.6 fL (ref 80.0–100.0)
Monocytes Absolute: 0.4 10*3/uL (ref 0.1–1.0)
Monocytes Relative: 5 %
Neutro Abs: 7.1 10*3/uL (ref 1.7–7.7)
Neutrophils Relative %: 88 %
Platelet Count: 196 10*3/uL (ref 150–400)
RBC: 4.17 MIL/uL (ref 3.87–5.11)
RDW: 14.6 % (ref 11.5–15.5)
WBC Count: 8.1 10*3/uL (ref 4.0–10.5)
nRBC: 0 % (ref 0.0–0.2)

## 2019-03-08 LAB — CMP (CANCER CENTER ONLY)
ALT: 11 U/L (ref 0–44)
AST: 12 U/L — ABNORMAL LOW (ref 15–41)
Albumin: 4.4 g/dL (ref 3.5–5.0)
Alkaline Phosphatase: 62 U/L (ref 38–126)
Anion gap: 9 (ref 5–15)
BUN: 19 mg/dL (ref 8–23)
CO2: 33 mmol/L — ABNORMAL HIGH (ref 22–32)
Calcium: 9.4 mg/dL (ref 8.9–10.3)
Chloride: 94 mmol/L — ABNORMAL LOW (ref 98–111)
Creatinine: 1.02 mg/dL — ABNORMAL HIGH (ref 0.44–1.00)
GFR, Est AFR Am: >60 mL/min (ref >60)
GFR, Estimated: 58 mL/min — ABNORMAL LOW (ref >60)
Glucose, Bld: 143 mg/dL — ABNORMAL HIGH (ref 70–99)
Potassium: 3.4 mmol/L — ABNORMAL LOW (ref 3.5–5.1)
Sodium: 136 mmol/L (ref 135–145)
Total Bilirubin: 0.7 mg/dL (ref 0.3–1.2)
Total Protein: 7.1 g/dL (ref 6.5–8.1)

## 2019-03-08 NOTE — Progress Notes (Signed)
Hematology and Oncology Follow Up Visit  [name] Carr [id number] 1955/09/14 64 y.o. 03/08/2019   Principle Diagnosis:  Metastatic breast cancer Right lower lobe pulmonary nodule  Current Therapy:   Observation   Interim History:  [name] Carr is here today for follow-up.  She is doing okay although she is having some problems with depression.  She is on several medicines for depression.  She is in between doctors right now.  We did go ahead and get a CT scan on her today.  Thankfully, this showed that his right lower lobe nodule has not grown in size.  This nodule is 1.1 x 1 cm.  She has had no issues with respect to her legs.  Last time she was here she has cellulitis.  Her legs look pretty good right now.  There is no swelling.  We did go and give her some food today.  She has a really hard time getting around.  She comes in a wheelchair.  She has had no issues with nausea or vomiting.  There is been no issues with diarrhea.  Currently, her performance status is ECOG 2.    Medications:  Allergies as of 03/08/2019      Reactions   Ammonia Shortness Of Breath, Other (See Comments)   Other reaction(s): ASTHMA Ended up on ventilator from ammonia tabs   Bee Venom Anaphylaxis   Contrast Media [iodinated Diagnostic Agents] Other (See Comments)   Other reaction(s): DIFFICULTY BREATHING Doesn't breath well.   Infliximab Nausea And Vomiting, Swelling   Other reaction(s): ANAPHYLAXIS   Iohexol Shortness Of Breath      Methotrexate Anaphylaxis, Swelling   Midazolam Hcl Anaphylaxis   Nalbuphine Other (See Comments), Anaphylaxis   Can't breathe well Other reaction(s): ANAPHYLAXIS   Pentazocine Anaphylaxis   Tetracyclines & Related Anaphylaxis   Ketorolac Tromethamine Other (See Comments)   Injectable does work and pills don't work.   Midazolam Anxiety   Morphine And Related Hives   Nsaids Nausea And Vomiting   Hydroxychloroquine    Penicillins    Has patient had a PCN  reaction causing immediate rash, facial/tongue/throat swelling, SOB or lightheadedness with hypotension: No Has patient had a PCN reaction causing severe rash involving mucus membranes or skin necrosis: No Has patient had a PCN reaction that required hospitalization: No Has patient had a PCN reaction occurring within the last 10 years: No If all of the above answers are "NO", then may proceed with Cephalosporin use. Tolerates cephalosporins   Rofecoxib    Celecoxib Rash   Other reaction(s): GI Upset (intolerance)      Medication List       Accurate as of March 08, 2019  3:09 PM. If you have any questions, ask your nurse or doctor.        busPIRone 10 MG tablet Commonly known as: BUSPAR Take 1 tablet by mouth 2 (two) times daily.   clonazePAM 0.5 MG tablet Commonly known as: KLONOPIN Take 1 tablet (0.5 mg total) by mouth 2 (two) times daily.   DULoxetine 60 MG capsule Commonly known as: CYMBALTA Take 1 capsule (60 mg total) by mouth 2 (two) times daily.   furosemide 20 MG tablet Commonly known as: LASIX Take 20 mg by mouth daily.   gabapentin 800 MG tablet Commonly known as: NEURONTIN Take 800 mg by mouth 3 (three) times daily.   hydrochlorothiazide 25 MG tablet Commonly known as: HYDRODIURIL Take 25 mg by mouth daily.   methadone 5 MG tablet Commonly known  as: DOLOPHINE Take 1 tablet (5 mg total) by mouth 2 (two) times daily.   metoprolol tartrate 25 MG tablet Commonly known as: LOPRESSOR Take 25 mg by mouth 2 (two) times daily.   nystatin powder Commonly known as: MYCOSTATIN/NYSTOP Apply twice daily to affected area   ondansetron 8 MG tablet Commonly known as: ZOFRAN Take 8 mg by mouth 3 (three) times daily as needed for nausea or vomiting.   pantoprazole 40 MG tablet Commonly known as: PROTONIX Take 40 mg by mouth 2 (two) times daily.   polyvinyl alcohol 1.4 % ophthalmic solution Commonly known as: LIQUIFILM TEARS Place 1 drop into both eyes every 2  (two) hours as needed for dry eyes.   potassium chloride 20 MEQ packet Commonly known as: KLOR-CON Take 20 mEq by mouth 2 (two) times daily.   predniSONE 10 MG tablet Commonly known as: DELTASONE Take 10 mg by mouth daily with breakfast.   promethazine 25 MG tablet Commonly known as: PHENERGAN Take 25 mg by mouth every 6 (six) hours as needed for nausea or vomiting.   rOPINIRole 2 MG tablet Commonly known as: REQUIP Take 2 mg by mouth at bedtime.   senna-docusate 8.6-50 MG tablet Commonly known as: Senokot-S Take 2 tablets by mouth 2 (two) times daily.   spironolactone 25 MG tablet Commonly known as: ALDACTONE Take 50 mg by mouth daily. 2 tablets   traZODone 50 MG tablet Commonly known as: DESYREL TAKE 1 TABLET(50 MG) BY MOUTH AT BEDTIME AS NEEDED FOR SLEEP       Allergies:  Allergies  Allergen Reactions  . Ammonia Shortness Of Breath and Other (See Comments)    Other reaction(s): ASTHMA Ended up on ventilator from ammonia tabs  . Bee Venom Anaphylaxis  . Contrast Media [Iodinated Diagnostic Agents] Other (See Comments)    Other reaction(s): DIFFICULTY BREATHING Doesn't breath well.  . Infliximab Nausea And Vomiting and Swelling    Other reaction(s): ANAPHYLAXIS  . Iohexol Shortness Of Breath       . Methotrexate Anaphylaxis and Swelling  . Midazolam Hcl Anaphylaxis  . Nalbuphine Other (See Comments) and Anaphylaxis    Can't breathe well Other reaction(s): ANAPHYLAXIS  . Pentazocine Anaphylaxis  . Tetracyclines & Related Anaphylaxis  . Ketorolac Tromethamine Other (See Comments)    Injectable does work and pills don't work.  . Midazolam Anxiety  . Morphine And Related Hives  . Nsaids Nausea And Vomiting  . Hydroxychloroquine   . Penicillins     Has patient had a PCN reaction causing immediate rash, facial/tongue/throat swelling, SOB or lightheadedness with hypotension: No Has patient had a PCN reaction causing severe rash involving mucus membranes or  skin necrosis: No Has patient had a PCN reaction that required hospitalization: No Has patient had a PCN reaction occurring within the last 10 years: No If all of the above answers are "NO", then may proceed with Cephalosporin use.  Tolerates cephalosporins  . Rofecoxib   . Celecoxib Rash    Other reaction(s): GI Upset (intolerance)    Past Medical History, Surgical history, Social history, and Family History were reviewed and updated.  Review of Systems: Review of Systems  Constitutional: Positive for malaise/fatigue.  HENT: Negative.   Eyes: Negative.   Respiratory: Negative.   Cardiovascular: Negative.   Gastrointestinal: Negative.   Genitourinary: Negative.   Musculoskeletal: Positive for back pain and joint pain.  Skin: Negative.   Neurological: Negative.   Endo/Heme/Allergies: Negative.   Psychiatric/Behavioral: Positive for depression.  Physical Exam:  vitals were not taken for this visit.   Wt Readings from Last 3 Encounters:  09/06/18 159 lb 9.6 oz (72.4 kg)  03/08/18 170 lb 6.4 oz (77.3 kg)  09/03/17 182 lb 15.7 oz (83 kg)    Physical Exam Vitals reviewed.  HENT:     Head: Normocephalic and atraumatic.  Eyes:     Pupils: Pupils are equal, round, and reactive to light.  Cardiovascular:     Rate and Rhythm: Normal rate and regular rhythm.     Heart sounds: Normal heart sounds.  Pulmonary:     Effort: Pulmonary effort is normal.     Breath sounds: Normal breath sounds.  Abdominal:     General: Bowel sounds are normal.     Palpations: Abdomen is soft.  Musculoskeletal:        General: No tenderness or deformity. Normal range of motion.     Cervical back: Normal range of motion.  Lymphadenopathy:     Cervical: No cervical adenopathy.  Skin:    General: Skin is warm and dry.     Findings: No erythema or rash.  Neurological:     Mental Status: She is alert and oriented to person, place, and time.  Psychiatric:        Behavior: Behavior normal.         Thought Content: Thought content normal.        Judgment: Judgment normal.      Lab Results  Component Value Date   WBC 8.1 03/08/2019   HGB 11.7 (L) 03/08/2019   HCT 36.1 03/08/2019   MCV 86.6 03/08/2019   PLT 196 03/08/2019   Lab Results  Component Value Date   FERRITIN 336 (H) 05/07/2014   IRON 20 (L) 05/07/2014   TIBC 321 05/07/2014   UIBC 301 05/07/2014   IRONPCTSAT 6 (L) 05/07/2014   Lab Results  Component Value Date   RETICCTPCT 2.19 (H) 09/27/2011   RBC 4.17 03/08/2019   RETICCTABS 88.48 09/27/2011   No results found for: KPAFRELGTCHN, LAMBDASER, KAPLAMBRATIO No results found for: IGGSERUM, IGA, IGMSERUM No results found for: Odetta Pink, SPEI   Chemistry      Component Value Date/Time   NA 137 09/06/2018 1425   NA 138 07/27/2016 1427   NA 135 (L) 04/08/2013 1148   K 3.4 (L) 09/06/2018 1425   K 4.4 07/27/2016 1427   K 4.6 04/08/2013 1148   CL 94 (L) 09/06/2018 1425   CL 102 07/27/2016 1427   CL 91 (L) 03/15/2012 1410   CO2 33 (H) 09/06/2018 1425   CO2 27 07/27/2016 1427   CO2 28 04/08/2013 1148   BUN 16 09/06/2018 1425   BUN 13 07/27/2016 1427   BUN 29.9 (H) 04/08/2013 1148   CREATININE 0.95 09/06/2018 1425   CREATININE 1.0 07/27/2016 1427   CREATININE 1.2 (H) 04/08/2013 1148   GLU 116 05/05/2016 0000      Component Value Date/Time   CALCIUM 9.1 09/06/2018 1425   CALCIUM 9.0 07/27/2016 1427   CALCIUM 9.6 04/08/2013 1148   ALKPHOS 71 09/06/2018 1425   ALKPHOS 62 07/27/2016 1427   ALKPHOS 100 04/08/2013 1148   AST 15 09/06/2018 1425   AST 17 04/08/2013 1148   ALT 13 09/06/2018 1425   ALT 23 07/27/2016 1427   ALT 18 04/08/2013 1148   BILITOT 1.1 09/06/2018 1425   BILITOT 0.34 04/08/2013 1148       Impression and Plan:  [name] Carr is a very pleasant 64 yo caucasian female with metastatic breast cancer.  I really doubt that she has obvious metastatic breast cancer.  She is on no  treatment for metastatic breast cancer.  We cannot find any evidence of metastatic breast cancer.  I would have thought that the CAT scan would have shown something.  We will now get a CT scan in 8 months.  She has other health issues that clearly are much more clinically significant then breast cancer.  We did give her a bag of food today to make it easier for her.  We gave her a prayer blanket which she definitely enjoyed.  We had a little prayer session which also made her feel good.    Volanda Napoleon, MD 1/29/20213:09 PM

## 2019-03-09 LAB — CANCER ANTIGEN 27.29: CA 27.29: 27.5 U/mL (ref 0.0–38.6)

## 2019-03-11 LAB — LACTATE DEHYDROGENASE: LDH: 202 U/L — ABNORMAL HIGH (ref 98–192)

## 2019-03-29 ENCOUNTER — Other Ambulatory Visit: Payer: Self-pay | Admitting: Family

## 2019-03-29 DIAGNOSIS — B372 Candidiasis of skin and nail: Secondary | ICD-10-CM

## 2019-03-29 DIAGNOSIS — R911 Solitary pulmonary nodule: Secondary | ICD-10-CM

## 2019-03-29 DIAGNOSIS — G4701 Insomnia due to medical condition: Secondary | ICD-10-CM

## 2019-06-17 ENCOUNTER — Telehealth: Payer: Self-pay | Admitting: Physician Assistant

## 2019-06-17 NOTE — Telephone Encounter (Signed)
Called to discuss the homebound Covid-19 vaccination initiative with the patient and/or caregiver.   Message left to call back.  Raziya Aveni PA-C  [facility]     

## 2019-06-20 ENCOUNTER — Telehealth: Payer: Self-pay | Admitting: Adult Health

## 2019-06-20 NOTE — Telephone Encounter (Signed)
I connected by phone with [name] Carr and/or patient's caregiver on 06/20/2019 at 7:25 PM to discuss the potential vaccination through our Homebound vaccination initiative.   She has multiply drug allergies with anaphylaxis history would not be candidate for home injection. Advised to reach out to PCP.   [name] 06/20/2019 7:25 PM

## 2019-10-25 ENCOUNTER — Ambulatory Visit (HOSPITAL_BASED_OUTPATIENT_CLINIC_OR_DEPARTMENT_OTHER): Payer: Medicare Other

## 2019-10-25 ENCOUNTER — Inpatient Hospital Stay: Payer: Medicare Other

## 2019-10-25 ENCOUNTER — Ambulatory Visit: Payer: Medicare Other | Admitting: Hematology & Oncology

## 2019-12-03 ENCOUNTER — Inpatient Hospital Stay: Payer: Medicare Other

## 2019-12-03 ENCOUNTER — Inpatient Hospital Stay: Payer: Medicare Other | Admitting: Hematology & Oncology

## 2019-12-03 ENCOUNTER — Ambulatory Visit (HOSPITAL_BASED_OUTPATIENT_CLINIC_OR_DEPARTMENT_OTHER): Payer: Medicare Other

## 2019-12-05 ENCOUNTER — Ambulatory Visit (HOSPITAL_BASED_OUTPATIENT_CLINIC_OR_DEPARTMENT_OTHER): Payer: Medicare Other

## 2019-12-09 ENCOUNTER — Telehealth: Payer: Self-pay | Admitting: *Deleted

## 2019-12-09 NOTE — Telephone Encounter (Signed)
Patient called requesting transportation to her appt tomorrow 12/10/2019.  Therapist, nutritional for assistance.

## 2019-12-10 ENCOUNTER — Ambulatory Visit (HOSPITAL_BASED_OUTPATIENT_CLINIC_OR_DEPARTMENT_OTHER): Payer: Medicare Other

## 2019-12-10 ENCOUNTER — Ambulatory Visit: Payer: Medicare Other | Admitting: Hematology & Oncology

## 2019-12-10 ENCOUNTER — Other Ambulatory Visit: Payer: Medicare Other

## 2020-01-20 ENCOUNTER — Other Ambulatory Visit: Payer: Self-pay

## 2020-01-20 ENCOUNTER — Encounter: Payer: Self-pay | Admitting: Hematology & Oncology

## 2020-01-20 ENCOUNTER — Ambulatory Visit (HOSPITAL_BASED_OUTPATIENT_CLINIC_OR_DEPARTMENT_OTHER)
Admission: RE | Admit: 2020-01-20 | Discharge: 2020-01-20 | Disposition: A | Discharge status: Home / Self Care | Payer: Medicare Other | Source: Ambulatory Visit | Attending: Hematology & Oncology | Admitting: Hematology & Oncology

## 2020-01-20 ENCOUNTER — Inpatient Hospital Stay: Payer: Medicare Other | Attending: Family

## 2020-01-20 ENCOUNTER — Inpatient Hospital Stay (HOSPITAL_BASED_OUTPATIENT_CLINIC_OR_DEPARTMENT_OTHER): Payer: Medicare Other | Admitting: Hematology & Oncology

## 2020-01-20 VITALS — BP 126/49 | HR 65 | Temp 99.0°F | Resp 18

## 2020-01-20 DIAGNOSIS — Z853 Personal history of malignant neoplasm of breast: Secondary | ICD-10-CM | POA: Diagnosis not present

## 2020-01-20 DIAGNOSIS — C50011 Malignant neoplasm of nipple and areola, right female breast: Secondary | ICD-10-CM

## 2020-01-20 DIAGNOSIS — R911 Solitary pulmonary nodule: Secondary | ICD-10-CM | POA: Insufficient documentation

## 2020-01-20 LAB — CBC WITH DIFFERENTIAL (CANCER CENTER ONLY)
Abs Immature Granulocytes: 0.02 10*3/uL (ref 0.00–0.07)
Basophils Absolute: 0 10*3/uL (ref 0.0–0.1)
Basophils Relative: 0 %
Eosinophils Absolute: 0 10*3/uL (ref 0.0–0.5)
Eosinophils Relative: 0 %
HCT: 38.2 % (ref 36.0–46.0)
Hemoglobin: 11.8 g/dL — ABNORMAL LOW (ref 12.0–15.0)
Immature Granulocytes: 0 %
Lymphocytes Relative: 7 %
Lymphs Abs: 0.7 10*3/uL (ref 0.7–4.0)
MCH: 27.6 pg (ref 26.0–34.0)
MCHC: 30.9 g/dL (ref 30.0–36.0)
MCV: 89.5 fL (ref 80.0–100.0)
Monocytes Absolute: 0.3 10*3/uL (ref 0.1–1.0)
Monocytes Relative: 3 %
Neutro Abs: 8.2 10*3/uL — ABNORMAL HIGH (ref 1.7–7.7)
Neutrophils Relative %: 90 %
Platelet Count: 261 10*3/uL (ref 150–400)
RBC: 4.27 MIL/uL (ref 3.87–5.11)
RDW: 14.8 % (ref 11.5–15.5)
WBC Count: 9.3 10*3/uL (ref 4.0–10.5)
nRBC: 0 % (ref 0.0–0.2)

## 2020-01-20 LAB — CMP (CANCER CENTER ONLY)
ALT: 9 U/L (ref 0–44)
AST: 17 U/L (ref 15–41)
Albumin: 4.1 g/dL (ref 3.5–5.0)
Alkaline Phosphatase: 77 U/L (ref 38–126)
Anion gap: 9 (ref 5–15)
BUN: 14 mg/dL (ref 8–23)
CO2: 25 mmol/L (ref 22–32)
Calcium: 9.5 mg/dL (ref 8.9–10.3)
Chloride: 101 mmol/L (ref 98–111)
Creatinine: 0.95 mg/dL (ref 0.44–1.00)
GFR, Estimated: >60 mL/min (ref >60)
Glucose, Bld: 132 mg/dL — ABNORMAL HIGH (ref 70–99)
Potassium: 5 mmol/L (ref 3.5–5.1)
Sodium: 135 mmol/L (ref 135–145)
Total Bilirubin: 0.8 mg/dL (ref 0.3–1.2)
Total Protein: 6.8 g/dL (ref 6.5–8.1)

## 2020-01-20 NOTE — Progress Notes (Signed)
Hematology and Oncology Follow Up Visit  [name] Carr [id number] [birth year] 64 y.o. 01/20/2020   Principle Diagnosis:  Metastatic breast cancer Right lower lobe pulmonary nodule  Current Therapy:   Observation   Interim History:  [name] Carr is here today for follow-up.  We last saw [name] Carr back in January.  Since then, [name] Carr been having problems with [name] Carr back.  Sounds like [name] Carr has spinal stenosis.  [name] Carr has weakness in [name] Carr legs.  [name] Carr apparently has been referred to neurosurgery.  Again, I see no evidence of metastatic breast cancer.  We did do a CT scan on [name] Carr today.  There is an unchanged right lower lobe nodule measuring 1.1 x 1 cm.  This is not changed in 2 years.  As such, this likely is not malignant.  [name] Carr last CA 27.29 was stable at 27.  Again, I just do not believe that there is any problems with respect to metastatic breast cancer.  I see no problems with [name] Carr having back surgery if [name] Carr needs it.  [name] Carr has had no bleeding or bruising.  [name] Carr has had some urinary issues.  There is no bowel issues.  [name] Carr has had no problems with nausea or vomiting.  Overall, I would have to say that [name] Carr performance status is ECOG 2.      Medications:  Allergies as of 01/20/2020      Reactions   Ammonia Shortness Of Breath, Other (See Comments)   Other reaction(s): ASTHMA Ended up on ventilator from ammonia tabs   Bee Venom Anaphylaxis   Contrast Media [iodinated Diagnostic Agents] Other (See Comments)   Other reaction(s): DIFFICULTY BREATHING Doesn't breath well.   Infliximab Nausea And Vomiting, Swelling   Other reaction(s): ANAPHYLAXIS   Iohexol Shortness Of Breath      Methotrexate Anaphylaxis, Swelling   Midazolam Hcl Anaphylaxis   Nalbuphine Other (See Comments), Anaphylaxis   Can't breathe well Other reaction(s): ANAPHYLAXIS   Pentazocine Anaphylaxis   Tetracyclines & Related Anaphylaxis   Ketorolac Tromethamine Other (See Comments)   Injectable does work and pills don't work.    Midazolam Anxiety   Morphine And Related Hives   Nsaids Nausea And Vomiting   Hydroxychloroquine    Penicillins    Has patient had a PCN reaction causing immediate rash, facial/tongue/throat swelling, SOB or lightheadedness with hypotension: No Has patient had a PCN reaction causing severe rash involving mucus membranes or skin necrosis: No Has patient had a PCN reaction that required hospitalization: No Has patient had a PCN reaction occurring within the last 10 years: No If all of the above answers are "NO", then may proceed with Cephalosporin use. Tolerates cephalosporins   Rofecoxib    Celecoxib Rash   Other reaction(s): GI Upset (intolerance)      Medication List       Accurate as of January 20, 2020  4:06 PM. If you have any questions, ask your nurse or doctor.        Accu-Chek Aviva Plus test strip Generic drug: glucose blood daily.   Accu-Chek FastClix Lancets Misc Apply 1 each topically daily.   acetaminophen 500 MG tablet Commonly known as: TYLENOL Take by mouth.   atorvastatin 20 MG tablet Commonly known as: LIPITOR Take 20 mg by mouth daily.   brimonidine 0.2 % ophthalmic solution Commonly known as: ALPHAGAN Place 1 drop into the right eye 3 (three) times daily.   busPIRone 10 MG tablet Commonly known as: BUSPAR Take 1 tablet by mouth 2 (two) times daily.  clonazePAM 0.5 MG tablet Commonly known as: KLONOPIN Take 1 tablet (0.5 mg total) by mouth 2 (two) times daily.   dorzolamide 2 % ophthalmic solution Commonly known as: TRUSOPT Place 1 drop into the right eye 3 (three) times daily.   DULoxetine 60 MG capsule Commonly known as: CYMBALTA Take 1 capsule (60 mg total) by mouth 2 (two) times daily.   furosemide 20 MG tablet Commonly known as: LASIX Take 20 mg by mouth daily.   gabapentin 800 MG tablet Commonly known as: NEURONTIN Take 800 mg by mouth 3 (three) times daily.   hydrochlorothiazide 25 MG tablet Commonly known as:  HYDRODIURIL Take 25 mg by mouth daily.   HYDROcodone-acetaminophen 10-325 MG tablet Commonly known as: NORCO Take by mouth.   Januvia 100 MG tablet Generic drug: sitaGLIPtin Take 100 mg by mouth daily.   LACTOBACILLUS ACID-PECTIN PO Take 1 tablet by mouth daily.   lidocaine 5 % Commonly known as: [place] onto the skin.   Magnesium Oxide 400 (240 Mg) MG Tabs Take 1 tablet by mouth daily.   methadone 5 MG tablet Commonly known as: DOLOPHINE Take 1 tablet (5 mg total) by mouth 2 (two) times daily.   metoprolol tartrate 25 MG tablet Commonly known as: LOPRESSOR Take 25 mg by mouth 2 (two) times daily.   Narcan 4 MG/0.1ML Liqd nasal spray kit Generic drug: naloxone SMARTSIG:1 Spray(s) Both Nares Once PRN   nystatin powder Commonly known as: MYCOSTATIN/NYSTOP Apply twice daily to affected area   ondansetron 8 MG tablet Commonly known as: ZOFRAN Take 8 mg by mouth 3 (three) times daily as needed for nausea or vomiting.   oxyCODONE-acetaminophen 5-325 MG tablet Commonly known as: PERCOCET/ROXICET Take 1 tablet by mouth every 6 (six) hours as needed.   pantoprazole 40 MG tablet Commonly known as: PROTONIX Take 40 mg by mouth 2 (two) times daily.   polyvinyl alcohol 1.4 % ophthalmic solution Commonly known as: LIQUIFILM TEARS Place 1 drop into both eyes every 2 (two) hours as needed for dry eyes.   potassium chloride 20 MEQ packet Commonly known as: KLOR-CON Take 20 mEq by mouth 2 (two) times daily.   predniSONE 10 MG tablet Commonly known as: DELTASONE Take 10 mg by mouth daily with breakfast.   promethazine 25 MG tablet Commonly known as: PHENERGAN Take 25 mg by mouth every 6 (six) hours as needed for nausea or vomiting.   rOPINIRole 2 MG tablet Commonly known as: REQUIP Take 2 mg by mouth at bedtime.   senna-docusate 8.6-50 MG tablet Commonly known as: Senokot-S Take 2 tablets by mouth 2 (two) times daily.   spironolactone 25 MG  tablet Commonly known as: ALDACTONE Take 50 mg by mouth daily. 2 tablets   tiZANidine 4 MG tablet Commonly known as: ZANAFLEX Take by mouth.   traZODone 50 MG tablet Commonly known as: DESYREL TAKE 1 TABLET(50 MG) BY MOUTH AT BEDTIME AS NEEDED FOR SLEEP       Allergies:  Allergies  Allergen Reactions  . Ammonia Shortness Of Breath and Other (See Comments)    Other reaction(s): ASTHMA Ended up on ventilator from ammonia tabs  . Bee Venom Anaphylaxis  . Contrast Media [Iodinated Diagnostic Agents] Other (See Comments)    Other reaction(s): DIFFICULTY BREATHING Doesn't breath well.  . Infliximab Nausea And Vomiting and Swelling    Other reaction(s): ANAPHYLAXIS  . Iohexol Shortness Of Breath       . Methotrexate Anaphylaxis and Swelling  . Midazolam Hcl Anaphylaxis  .  Nalbuphine Other (See Comments) and Anaphylaxis    Can't breathe well Other reaction(s): ANAPHYLAXIS  . Pentazocine Anaphylaxis  . Tetracyclines & Related Anaphylaxis  . Ketorolac Tromethamine Other (See Comments)    Injectable does work and pills don't work.  . Midazolam Anxiety  . Morphine And Related Hives  . Nsaids Nausea And Vomiting  . Hydroxychloroquine   . Penicillins     Has patient had a PCN reaction causing immediate rash, facial/tongue/throat swelling, SOB or lightheadedness with hypotension: No Has patient had a PCN reaction causing severe rash involving mucus membranes or skin necrosis: No Has patient had a PCN reaction that required hospitalization: No Has patient had a PCN reaction occurring within the last 10 years: No If all of the above answers are "NO", then may proceed with Cephalosporin use.  Tolerates cephalosporins  . Rofecoxib   . Celecoxib Rash    Other reaction(s): GI Upset (intolerance)    Past Medical History, Surgical history, Social history, and Family History were reviewed and updated.  Review of Systems: Review of Systems  Constitutional: Positive for  malaise/fatigue.  HENT: Negative.   Eyes: Negative.   Respiratory: Negative.   Cardiovascular: Negative.   Gastrointestinal: Negative.   Genitourinary: Negative.   Musculoskeletal: Positive for back pain and joint pain.  Skin: Negative.   Neurological: Negative.   Endo/Heme/Allergies: Negative.   Psychiatric/Behavioral: Positive for depression.     Physical Exam:  oral temperature is 99 F (37.2 C). [name] Carr blood pressure is 126/49 (abnormal) and [name] Carr pulse is 65. [name] Carr respiration is 18 and oxygen saturation is 98%.   Wt Readings from Last 3 Encounters:  03/08/19 155 lb (70.3 kg)  09/06/18 159 lb 9.6 oz (72.4 kg)  03/08/18 170 lb 6.4 oz (77.3 kg)    Physical Exam Vitals reviewed.  HENT:     Head: Normocephalic and atraumatic.  Eyes:     Pupils: Pupils are equal, round, and reactive to light.  Cardiovascular:     Rate and Rhythm: Normal rate and regular rhythm.     Heart sounds: Normal heart sounds.  Pulmonary:     Effort: Pulmonary effort is normal.     Breath sounds: Normal breath sounds.  Abdominal:     General: Bowel sounds are normal.     Palpations: Abdomen is soft.  Musculoskeletal:        General: No tenderness or deformity. Normal range of motion.     Cervical back: Normal range of motion.  Lymphadenopathy:     Cervical: No cervical adenopathy.  Skin:    General: Skin is warm and dry.     Findings: No erythema or rash.  Neurological:     Mental Status: [name] Carr is alert and oriented to person, place, and time.  Psychiatric:        Behavior: Behavior normal.        Thought Content: Thought content normal.        Judgment: Judgment normal.      Lab Results  Component Value Date   WBC 9.3 01/20/2020   HGB 11.8 (L) 01/20/2020   HCT 38.2 01/20/2020   MCV 89.5 01/20/2020   PLT 261 01/20/2020   Lab Results  Component Value Date   FERRITIN 336 (H) 05/07/2014   IRON 20 (L) 05/07/2014   TIBC 321 05/07/2014   UIBC 301 05/07/2014   IRONPCTSAT 6 (L) 05/07/2014    Lab Results  Component Value Date   RETICCTPCT 2.19 (H) 09/27/2011   RBC 4.27 01/20/2020  RETICCTABS 88.48 09/27/2011   No results found for: KPAFRELGTCHN, LAMBDASER, KAPLAMBRATIO No results found for: IGGSERUM, IGA, IGMSERUM No results found for: Kathrynn Ducking, MSPIKE, SPEI   Chemistry      Component Value Date/Time   NA 135 01/20/2020 1427   NA 138 07/27/2016 1427   NA 135 (L) 04/08/2013 1148   K 5.0 01/20/2020 1427   K 4.4 07/27/2016 1427   K 4.6 04/08/2013 1148   CL 101 01/20/2020 1427   CL 102 07/27/2016 1427   CL 91 (L) 03/15/2012 1410   CO2 25 01/20/2020 1427   CO2 27 07/27/2016 1427   CO2 28 04/08/2013 1148   BUN 14 01/20/2020 1427   BUN 13 07/27/2016 1427   BUN 29.9 (H) 04/08/2013 1148   CREATININE 0.95 01/20/2020 1427   CREATININE 1.0 07/27/2016 1427   CREATININE 1.2 (H) 04/08/2013 1148   GLU 116 05/05/2016 0000      Component Value Date/Time   CALCIUM 9.5 01/20/2020 1427   CALCIUM 9.0 07/27/2016 1427   CALCIUM 9.6 04/08/2013 1148   ALKPHOS 77 01/20/2020 1427   ALKPHOS 62 07/27/2016 1427   ALKPHOS 100 04/08/2013 1148   AST 17 01/20/2020 1427   AST 17 04/08/2013 1148   ALT 9 01/20/2020 1427   ALT 23 07/27/2016 1427   ALT 18 04/08/2013 1148   BILITOT 0.8 01/20/2020 1427   BILITOT 0.34 04/08/2013 1148      Impression and Plan: [name] Carr is a very pleasant 64 yo caucasian female with metastatic breast cancer.  I really am happy that the CT scan looks good.  Again, there is no evidence of metastatic disease.  Again if [name] Carr needs to have surgery, I do not see a problem with [name] Carr having this.  I will plan to see [name] Carr back in 6 months.  I really want to make sure that [name] Carr back is going to be taken care of.  It sounds like [name] Carr family doctor is working on this.      Volanda Napoleon, MD 12/13/20214:06 PM

## 2020-01-21 ENCOUNTER — Telehealth: Payer: Self-pay

## 2020-01-21 LAB — CANCER ANTIGEN 27.29: CA 27.29: 21.7 U/mL (ref 0.0–38.6)

## 2020-01-21 NOTE — Telephone Encounter (Signed)
aptps made per 01/20/20 los, left a vm and mailed a calendar///// AOM

## 2020-05-01 ENCOUNTER — Telehealth: Payer: Self-pay

## 2020-05-01 NOTE — Telephone Encounter (Signed)
appts r/s due to sarah out of the office thurs, left a vm with new appt    [name] Carr

## 2020-07-21 ENCOUNTER — Telehealth: Payer: Self-pay

## 2020-07-22 ENCOUNTER — Ambulatory Visit: Payer: Medicare Other | Admitting: Family

## 2020-07-22 ENCOUNTER — Other Ambulatory Visit: Payer: Medicare Other

## 2020-07-23 ENCOUNTER — Ambulatory Visit: Payer: Medicare Other | Admitting: Family

## 2020-07-23 ENCOUNTER — Other Ambulatory Visit: Payer: Medicare Other

## 2020-08-21 ENCOUNTER — Encounter: Payer: Self-pay | Admitting: Oncology

## 2020-08-25 ENCOUNTER — Inpatient Hospital Stay: Payer: Medicare Other

## 2020-08-25 ENCOUNTER — Inpatient Hospital Stay: Payer: Medicare Other | Admitting: Family

## 2020-08-25 ENCOUNTER — Encounter: Payer: Self-pay | Admitting: Oncology

## 2020-08-25 ENCOUNTER — Telehealth: Payer: Self-pay

## 2020-09-23 ENCOUNTER — Encounter: Payer: Self-pay | Admitting: Oncology

## 2020-09-24 ENCOUNTER — Encounter: Payer: Self-pay | Admitting: Oncology

## 2020-09-25 ENCOUNTER — Encounter: Payer: Self-pay | Admitting: Oncology

## 2020-09-26 ENCOUNTER — Encounter: Payer: Self-pay | Admitting: Oncology

## 2020-09-27 ENCOUNTER — Encounter: Payer: Self-pay | Admitting: Oncology

## 2020-09-28 ENCOUNTER — Encounter: Payer: Self-pay | Admitting: Oncology

## 2020-09-30 ENCOUNTER — Other Ambulatory Visit: Payer: Medicare Other

## 2020-09-30 ENCOUNTER — Ambulatory Visit: Payer: Medicare Other | Admitting: Hematology & Oncology

## 2020-10-27 ENCOUNTER — Encounter: Payer: Self-pay | Admitting: Oncology

## 2020-11-03 ENCOUNTER — Other Ambulatory Visit: Payer: Medicare Other

## 2020-11-03 ENCOUNTER — Ambulatory Visit: Payer: Medicare Other | Admitting: Hematology & Oncology

## 2021-01-04 ENCOUNTER — Telehealth: Payer: Self-pay | Admitting: Hematology & Oncology

## 2021-01-04 ENCOUNTER — Other Ambulatory Visit: Payer: Medicare Other

## 2021-01-04 ENCOUNTER — Inpatient Hospital Stay: Payer: Medicare Other | Admitting: Hematology & Oncology

## 2021-01-12 ENCOUNTER — Ambulatory Visit: Payer: Medicare Other | Admitting: Hematology & Oncology

## 2021-01-12 ENCOUNTER — Other Ambulatory Visit: Payer: Medicare Other

## 2021-01-28 ENCOUNTER — Other Ambulatory Visit: Payer: Self-pay | Admitting: *Deleted

## 2021-01-28 DIAGNOSIS — C50011 Malignant neoplasm of nipple and areola, right female breast: Secondary | ICD-10-CM

## 2021-01-29 ENCOUNTER — Inpatient Hospital Stay: Payer: Medicare Other | Admitting: Hematology & Oncology

## 2021-01-29 ENCOUNTER — Inpatient Hospital Stay: Payer: Medicare Other

## 2021-02-04 ENCOUNTER — Inpatient Hospital Stay: Payer: Medicare Other

## 2021-02-04 ENCOUNTER — Inpatient Hospital Stay: Payer: Medicare Other | Admitting: Hematology & Oncology

## 2021-03-10 ENCOUNTER — Other Ambulatory Visit: Payer: Medicare Other

## 2021-03-10 ENCOUNTER — Ambulatory Visit: Payer: Medicare Other | Admitting: Hematology & Oncology

## 2021-03-30 ENCOUNTER — Inpatient Hospital Stay: Payer: Medicare Other

## 2021-03-30 ENCOUNTER — Inpatient Hospital Stay: Payer: Medicare Other | Admitting: Hematology & Oncology

## 2021-09-09 ENCOUNTER — Telehealth: Payer: Self-pay | Admitting: *Deleted

## 2021-09-09 NOTE — Telephone Encounter (Signed)
[name] Carr nursing care [facility] called to see when patient's next appointment is. Patient was last seen in 2021 and since then appointments have been canceled. There are no future appointments. Called and lvm to Rashieda @ [phone] with this information.

## 2021-09-27 ENCOUNTER — Telehealth: Payer: Self-pay | Admitting: *Deleted

## 2021-09-27 NOTE — Telephone Encounter (Signed)
Message received from patient to confirm her appt this week with Dr. Marin Olp.  Pt informed that she does not have an appt this week and that last appt was in 03/2021 which she canceled.  Pt states that she is currently in [facility] [place] facility and would like appt called to [phone].  Message sent to scheduling.

## 2021-11-04 ENCOUNTER — Inpatient Hospital Stay: Payer: Medicare Other

## 2021-11-04 ENCOUNTER — Inpatient Hospital Stay: Payer: Medicare Other | Admitting: Hematology & Oncology

## 2021-12-20 ENCOUNTER — Inpatient Hospital Stay: Payer: Medicare Other | Attending: Hematology & Oncology

## 2021-12-20 ENCOUNTER — Inpatient Hospital Stay (HOSPITAL_BASED_OUTPATIENT_CLINIC_OR_DEPARTMENT_OTHER): Payer: Medicare Other | Admitting: Hematology & Oncology

## 2021-12-20 ENCOUNTER — Encounter: Payer: Self-pay | Admitting: Hematology & Oncology

## 2021-12-20 DIAGNOSIS — Z853 Personal history of malignant neoplasm of breast: Secondary | ICD-10-CM | POA: Diagnosis present

## 2021-12-20 DIAGNOSIS — C50011 Malignant neoplasm of nipple and areola, right female breast: Secondary | ICD-10-CM

## 2021-12-20 DIAGNOSIS — Z08 Encounter for follow-up examination after completed treatment for malignant neoplasm: Secondary | ICD-10-CM | POA: Insufficient documentation

## 2021-12-20 LAB — CMP (CANCER CENTER ONLY)
ALT: 7 U/L (ref 0–44)
AST: 11 U/L — ABNORMAL LOW (ref 15–41)
Albumin: 3.9 g/dL (ref 3.5–5.0)
Alkaline Phosphatase: 51 U/L (ref 38–126)
Anion gap: 9 (ref 5–15)
BUN: 22 mg/dL (ref 8–23)
CO2: 27 mmol/L (ref 22–32)
Calcium: 9.1 mg/dL (ref 8.9–10.3)
Chloride: 102 mmol/L (ref 98–111)
Creatinine: 1.04 mg/dL — ABNORMAL HIGH (ref 0.44–1.00)
GFR, Estimated: 59 mL/min — ABNORMAL LOW (ref >60)
Glucose, Bld: 115 mg/dL — ABNORMAL HIGH (ref 70–99)
Potassium: 4.4 mmol/L (ref 3.5–5.1)
Sodium: 138 mmol/L (ref 135–145)
Total Bilirubin: 0.4 mg/dL (ref 0.3–1.2)
Total Protein: 6.5 g/dL (ref 6.5–8.1)

## 2021-12-20 LAB — CBC WITH DIFFERENTIAL (CANCER CENTER ONLY)
Abs Immature Granulocytes: 0.05 10*3/uL (ref 0.00–0.07)
Basophils Absolute: 0.1 10*3/uL (ref 0.0–0.1)
Basophils Relative: 0 %
Eosinophils Absolute: 0.2 10*3/uL (ref 0.0–0.5)
Eosinophils Relative: 2 %
HCT: 32.3 % — ABNORMAL LOW (ref 36.0–46.0)
Hemoglobin: 9.9 g/dL — ABNORMAL LOW (ref 12.0–15.0)
Immature Granulocytes: 0 %
Lymphocytes Relative: 9 %
Lymphs Abs: 1 10*3/uL (ref 0.7–4.0)
MCH: 26.1 pg (ref 26.0–34.0)
MCHC: 30.7 g/dL (ref 30.0–36.0)
MCV: 85.2 fL (ref 80.0–100.0)
Monocytes Absolute: 0.7 10*3/uL (ref 0.1–1.0)
Monocytes Relative: 7 %
Neutro Abs: 9.1 10*3/uL — ABNORMAL HIGH (ref 1.7–7.7)
Neutrophils Relative %: 82 %
Platelet Count: ADEQUATE 10*3/uL (ref 150–400)
RBC: 3.79 MIL/uL — ABNORMAL LOW (ref 3.87–5.11)
RDW: 16.5 % — ABNORMAL HIGH (ref 11.5–15.5)
WBC Count: 11.2 10*3/uL — ABNORMAL HIGH (ref 4.0–10.5)
nRBC: 0 % (ref 0.0–0.2)

## 2021-12-20 NOTE — Progress Notes (Signed)
Hematology and Oncology Follow Up Visit  [name] Carr [id number] [birth year] 66 y.o. 12/20/2021   Principle Diagnosis:  Metastatic breast cancer Right lower lobe pulmonary nodule  Current Therapy:   Observation   Interim History:  [name] Carr is here today for a very long awaited follow-up.  We have not seen her for 2 years.  I am not sure exactly what happened in the interim.  She comes in a wheelchair.  She still is having quite a lot of issues.  She says that she had fallen recently.  She said that she broke her left upper arm.  However, surgery cannot be done from what she says.  She has a lot of pain.  She has had chronic pain issues.  I think she sees a pain specialist.  She has had a little bit of a cough.  She says she had COVID several weeks ago.  She is not sure how she got COVID.  She had a last CA 27.29 back in December 2021.  This was 22.  I really cannot find anything in the system about her going to the ER for falling and breaking her arm.  I know she has seen orthopedic surgery in the past for her left shoulder.  Again, it does not look like that she has had any surgery.  She has had no change in bowel or bladder habits.  She clearly needs to have a CAT scan done to see exactly what is going on internally.  Hopefully, there is nothing with respect to breast cancer.   Currently, I would say performance status is probably ECOG 2.  Medications:  Allergies as of 12/20/2021       Reactions   Ammonia Shortness Of Breath, Other (See Comments)   Other reaction(s): ASTHMA Ended up on ventilator from ammonia tabs   Bee Venom Anaphylaxis   Contrast Media [iodinated Contrast Media] Other (See Comments)   Other reaction(s): DIFFICULTY BREATHING Doesn't breath well.   Infliximab Nausea And Vomiting, Swelling   Other reaction(s): ANAPHYLAXIS   Iohexol Shortness Of Breath      Methotrexate Anaphylaxis, Swelling   Midazolam Hcl Anaphylaxis   Nalbuphine Other (See  Comments), Anaphylaxis   Can't breathe well Other reaction(s): ANAPHYLAXIS   Pentazocine Anaphylaxis   Tetracyclines & Related Anaphylaxis   Ketorolac Tromethamine Other (See Comments)   Injectable does work and pills don't work.   Midazolam Anxiety   Morphine And Related Hives   Nsaids Nausea And Vomiting   Hydroxychloroquine    Penicillins    Has patient had a PCN reaction causing immediate rash, facial/tongue/throat swelling, SOB or lightheadedness with hypotension: No Has patient had a PCN reaction causing severe rash involving mucus membranes or skin necrosis: No Has patient had a PCN reaction that required hospitalization: No Has patient had a PCN reaction occurring within the last 10 years: No If all of the above answers are "NO", then may proceed with Cephalosporin use. Tolerates cephalosporins   Rofecoxib    Celecoxib Rash   Other reaction(s): GI Upset (intolerance)        Medication List        Accurate as of December 20, 2021  4:15 PM. If you have any questions, ask your nurse or doctor.          STOP taking these medications    busPIRone 10 MG tablet Commonly known as: BUSPAR Stopped by: Volanda Napoleon, MD   oxyCODONE-acetaminophen 5-325 MG tablet Commonly known as: PERCOCET/ROXICET Stopped  by: Volanda Napoleon, MD   polyvinyl alcohol 1.4 % ophthalmic solution Commonly known as: LIQUIFILM TEARS Stopped by: Volanda Napoleon, MD       TAKE these medications    Accu-Chek Aviva Plus test strip Generic drug: glucose blood daily.   Accu-Chek FastClix Lancets Misc Apply 1 each topically daily.   acetaminophen 500 MG tablet Commonly known as: TYLENOL Take by mouth.   atorvastatin 20 MG tablet Commonly known as: LIPITOR Take 20 mg by mouth daily.   brimonidine 0.2 % ophthalmic solution Commonly known as: ALPHAGAN Place 1 drop into the right eye 3 (three) times daily.   clonazePAM 0.5 MG tablet Commonly known as: KLONOPIN Take 1 tablet (0.5  mg total) by mouth 2 (two) times daily.   docusate sodium 100 MG capsule Commonly known as: COLACE Take 100 mg by mouth 2 (two) times daily.   dorzolamide 2 % ophthalmic solution Commonly known as: TRUSOPT Place 1 drop into the right eye 3 (three) times daily.   doxycycline 100 MG tablet Commonly known as: ADOXA Take 100 mg by mouth 2 (two) times daily.   DULoxetine 60 MG capsule Commonly known as: CYMBALTA Take 1 capsule (60 mg total) by mouth 2 (two) times daily.   furosemide 20 MG tablet Commonly known as: LASIX Take 20 mg by mouth daily.   gabapentin 800 MG tablet Commonly known as: NEURONTIN Take 800 mg by mouth 3 (three) times daily.   hydrochlorothiazide 25 MG tablet Commonly known as: HYDRODIURIL Take 25 mg by mouth daily.   HYDROcodone-acetaminophen 10-325 MG tablet Commonly known as: NORCO Take 1 tablet by mouth every 6 (six) hours as needed.   Januvia 100 MG tablet Generic drug: sitaGLIPtin Take 100 mg by mouth daily.   LACTOBACILLUS ACID-PECTIN PO Take 1 tablet by mouth daily.   lidocaine 5 % Commonly known as: LIDODERM Place onto the skin.   magnesium oxide 400 (240 Mg) MG tablet Commonly known as: MAG-OX Take 1 tablet by mouth daily.   methadone 5 MG tablet Commonly known as: DOLOPHINE Take 1 tablet (5 mg total) by mouth 2 (two) times daily.   metoprolol tartrate 25 MG tablet Commonly known as: LOPRESSOR Take 25 mg by mouth 2 (two) times daily.   Narcan 4 MG/0.1ML Liqd nasal spray kit Generic drug: naloxone SMARTSIG:1 Spray(s) Both Nares Once PRN   nystatin powder Commonly known as: MYCOSTATIN/NYSTOP Apply twice daily to affected area   ondansetron 8 MG tablet Commonly known as: ZOFRAN Take 8 mg by mouth 3 (three) times daily as needed for nausea or vomiting.   pantoprazole 40 MG tablet Commonly known as: PROTONIX Take 40 mg by mouth 2 (two) times daily.   potassium chloride 20 MEQ packet Commonly known as: KLOR-CON Take 20 mEq  by mouth daily.   predniSONE 10 MG tablet Commonly known as: DELTASONE Take 10 mg by mouth daily with breakfast.   promethazine 25 MG tablet Commonly known as: PHENERGAN Take 25 mg by mouth every 6 (six) hours as needed for nausea or vomiting.   rOPINIRole 2 MG tablet Commonly known as: REQUIP Take 2 mg by mouth at bedtime.   senna-docusate 8.6-50 MG tablet Commonly known as: Senokot-S Take 2 tablets by mouth 2 (two) times daily.   spironolactone 25 MG tablet Commonly known as: ALDACTONE Take 50 mg by mouth daily. 2 tablets   Systane 0.4-0.3 % Soln Generic drug: Polyethyl Glycol-Propyl Glycol Apply 2 drops to eye 3 (three) times daily.   tiZANidine  4 MG tablet Commonly known as: ZANAFLEX Take by mouth.   traZODone 50 MG tablet Commonly known as: DESYREL TAKE 1 TABLET(50 MG) BY MOUTH AT BEDTIME AS NEEDED FOR SLEEP   Vraylar 1.5 MG capsule Generic drug: cariprazine Take by mouth daily.        Allergies:  Allergies  Allergen Reactions   Ammonia Shortness Of Breath and Other (See Comments)    Other reaction(s): ASTHMA Ended up on ventilator from ammonia tabs   Bee Venom Anaphylaxis   Contrast Media [Iodinated Contrast Media] Other (See Comments)    Other reaction(s): DIFFICULTY BREATHING Doesn't breath well.   Infliximab Nausea And Vomiting and Swelling    Other reaction(s): ANAPHYLAXIS   Iohexol Shortness Of Breath        Methotrexate Anaphylaxis and Swelling   Midazolam Hcl Anaphylaxis   Nalbuphine Other (See Comments) and Anaphylaxis    Can't breathe well Other reaction(s): ANAPHYLAXIS   Pentazocine Anaphylaxis   Tetracyclines & Related Anaphylaxis   Ketorolac Tromethamine Other (See Comments)    Injectable does work and pills don't work.   Midazolam Anxiety   Morphine And Related Hives   Nsaids Nausea And Vomiting   Hydroxychloroquine    Penicillins     Has patient had a PCN reaction causing immediate rash, facial/tongue/throat swelling, SOB or  lightheadedness with hypotension: No Has patient had a PCN reaction causing severe rash involving mucus membranes or skin necrosis: No Has patient had a PCN reaction that required hospitalization: No Has patient had a PCN reaction occurring within the last 10 years: No If all of the above answers are "NO", then may proceed with Cephalosporin use.  Tolerates cephalosporins   Rofecoxib    Celecoxib Rash    Other reaction(s): GI Upset (intolerance)    Past Medical History, Surgical history, Social history, and Family History were reviewed and updated.  Review of Systems: Review of Systems  Constitutional:  Positive for malaise/fatigue.  HENT: Negative.    Eyes: Negative.   Respiratory: Negative.    Cardiovascular: Negative.   Gastrointestinal: Negative.   Genitourinary: Negative.   Musculoskeletal:  Positive for back pain and joint pain.  Skin: Negative.   Neurological: Negative.   Endo/Heme/Allergies: Negative.   Psychiatric/Behavioral:  Positive for depression.      Physical Exam: Her vital signs show a temperature of 98.5.  Pulse 65.  Blood pressure 100/78.  Weight is 165 pounds.  Wt Readings from Last 3 Encounters:  03/08/19 155 lb (70.3 kg)  09/06/18 159 lb 9.6 oz (72.4 kg)  03/08/18 170 lb 6.4 oz (77.3 kg)    Physical Exam Vitals reviewed.  HENT:     Head: Normocephalic and atraumatic.  Eyes:     Pupils: Pupils are equal, round, and reactive to light.  Cardiovascular:     Rate and Rhythm: Normal rate and regular rhythm.     Heart sounds: Normal heart sounds.  Pulmonary:     Effort: Pulmonary effort is normal.     Breath sounds: Normal breath sounds.  Abdominal:     General: Bowel sounds are normal.     Palpations: Abdomen is soft.  Musculoskeletal:        General: No tenderness or deformity. Normal range of motion.     Cervical back: Normal range of motion.     Comments: She has limited range of motion of the left shoulder.  Lymphadenopathy:      Cervical: No cervical adenopathy.  Skin:    General: Skin is warm  and dry.     Findings: No erythema or rash.  Neurological:     Mental Status: She is alert and oriented to person, place, and time.  Psychiatric:        Behavior: Behavior normal.        Thought Content: Thought content normal.        Judgment: Judgment normal.      Lab Results  Component Value Date   WBC 11.2 (H) 12/20/2021   HGB 9.9 (L) 12/20/2021   HCT 32.3 (L) 12/20/2021   MCV 85.2 12/20/2021   PLT  12/20/2021    PLATELET CLUMPS NOTED ON SMEAR, COUNT APPEARS ADEQUATE   Lab Results  Component Value Date   FERRITIN 336 (H) 05/07/2014   IRON 20 (L) 05/07/2014   TIBC 321 05/07/2014   UIBC 301 05/07/2014   IRONPCTSAT 6 (L) 05/07/2014   Lab Results  Component Value Date   RETICCTPCT 2.19 (H) 09/27/2011   RBC 3.79 (L) 12/20/2021   RETICCTABS 88.48 09/27/2011   No results found for: "KPAFRELGTCHN", "LAMBDASER", "KAPLAMBRATIO" No results found for: "IGGSERUM", "IGA", "IGMSERUM" No results found for: "TOTALPROTELP", "ALBUMINELP", "A1GS", "A2GS", "BETS", "BETA2SER", "GAMS", "MSPIKE", "SPEI"   Chemistry      Component Value Date/Time   NA 138 12/20/2021 1136   NA 138 07/27/2016 1427   NA 135 (L) 04/08/2013 1148   K 4.4 12/20/2021 1136   K 4.4 07/27/2016 1427   K 4.6 04/08/2013 1148   CL 102 12/20/2021 1136   CL 102 07/27/2016 1427   CL 91 (L) 03/15/2012 1410   CO2 27 12/20/2021 1136   CO2 27 07/27/2016 1427   CO2 28 04/08/2013 1148   BUN 22 12/20/2021 1136   BUN 13 07/27/2016 1427   BUN 29.9 (H) 04/08/2013 1148   CREATININE 1.04 (H) 12/20/2021 1136   CREATININE 1.0 07/27/2016 1427   CREATININE 1.2 (H) 04/08/2013 1148   GLU 116 05/05/2016 0000      Component Value Date/Time   CALCIUM 9.1 12/20/2021 1136   CALCIUM 9.0 07/27/2016 1427   CALCIUM 9.6 04/08/2013 1148   ALKPHOS 51 12/20/2021 1136   ALKPHOS 62 07/27/2016 1427   ALKPHOS 100 04/08/2013 1148   AST 11 (L) 12/20/2021 1136   AST 17  04/08/2013 1148   ALT 7 12/20/2021 1136   ALT 23 07/27/2016 1427   ALT 18 04/08/2013 1148   BILITOT 0.4 12/20/2021 1136   BILITOT 0.34 04/08/2013 1148      Impression and Plan: Ms. Wulf is a very pleasant 66 yo caucasian female with metastatic breast cancer.  Again, we have not seen her for couple years.  She has all these issues.  She still have a lot of pain issues.  CT scan can certainly help [place] out.  If we can see if there are fractures.  We can see if there is any issues with breast cancer recurrence.  She basically has had metastatic breast cancer for many years.  Yet, we have not been able to uncover anything.  She has had no treatment.  I just feel bad for Ms. Ladell Pier.  It sounds like a lot is going on with her.  Hopefully, we will see that cancer is not an active problem for her.  We will try to get the CAT scan in couple weeks.  It would be nice to try to get her back to see [place] in another 4 or 5 weeks.      Volanda Napoleon, MD 11/13/20234:15  PM

## 2021-12-21 LAB — CANCER ANTIGEN 27.29: CA 27.29: 24.4 U/mL (ref 0.0–38.6)

## 2022-01-11 ENCOUNTER — Telehealth (HOSPITAL_BASED_OUTPATIENT_CLINIC_OR_DEPARTMENT_OTHER): Payer: Self-pay

## 2022-01-12 ENCOUNTER — Ambulatory Visit (HOSPITAL_BASED_OUTPATIENT_CLINIC_OR_DEPARTMENT_OTHER): Admission: RE | Admit: 2022-01-12 | Payer: Medicare Other | Source: Ambulatory Visit

## 2022-01-17 ENCOUNTER — Inpatient Hospital Stay: Payer: Medicare Other | Admitting: Family

## 2022-01-17 ENCOUNTER — Other Ambulatory Visit: Payer: Self-pay | Admitting: Family

## 2022-01-17 ENCOUNTER — Inpatient Hospital Stay: Payer: Medicare Other | Attending: Hematology & Oncology

## 2022-01-17 NOTE — Progress Notes (Signed)
Patient complaining of n/v and headache. She does not want to go to the ED. She prefers to go back to her assisted living facility and be seen by the provider there. She states that she had asked that we be contacted with her symptoms prior to bringing her in but this was not done. Her CT scans were also cancelled by her facility. We will see if we can get thee rescheduled along with her follow-up once she is feeling better. Patient in agreement with the plan. I also called and spoke with the charge nurse at [facility] and updated her on patient condition. They will have her seen by the in house provider once she returns. We recommend testing for flu and Covid.

## 2022-01-19 ENCOUNTER — Other Ambulatory Visit: Payer: Self-pay

## 2022-01-19 ENCOUNTER — Telehealth (HOSPITAL_BASED_OUTPATIENT_CLINIC_OR_DEPARTMENT_OTHER): Payer: Self-pay

## 2022-01-19 ENCOUNTER — Encounter: Payer: Self-pay | Admitting: Oncology

## 2022-01-19 DIAGNOSIS — M542 Cervicalgia: Secondary | ICD-10-CM | POA: Insufficient documentation

## 2022-01-19 DIAGNOSIS — M79602 Pain in left arm: Secondary | ICD-10-CM | POA: Diagnosis present

## 2022-01-19 DIAGNOSIS — W19XXXA Unspecified fall, initial encounter: Secondary | ICD-10-CM | POA: Insufficient documentation

## 2022-01-19 DIAGNOSIS — Y92129 Unspecified place in nursing home as the place of occurrence of the external cause: Secondary | ICD-10-CM | POA: Insufficient documentation

## 2022-01-19 DIAGNOSIS — R519 Headache, unspecified: Secondary | ICD-10-CM | POA: Insufficient documentation

## 2022-01-19 DIAGNOSIS — R42 Dizziness and giddiness: Secondary | ICD-10-CM | POA: Insufficient documentation

## 2022-01-19 MED ORDER — PREDNISONE 50 MG PO TABS: ORAL_TABLET | ORAL | 0 refills | Status: DC

## 2022-01-19 MED ORDER — DIPHENHYDRAMINE HCL 50 MG PO TABS: 50.0000 mg | ORAL_TABLET | Freq: Once | ORAL | 0 refills | Status: AC

## 2022-01-19 NOTE — Telephone Encounter (Signed)
Received phone call from patient facility asking if patient will be sedated for CT scan. Facility (This RN spoke to [facility]) educated that patients are not sedated for CT scans. [name] Carr was educated that since patient has an allergy to contrast we will send a prescription in to her pharmacy for prednisone and benadryl for her to take prior to her CT scan. Prescriptions sent to pharmacy. [name] Carr aware of patient time and date of scan. [name] Carr verbalized understanding and had no further questions.

## 2022-01-19 NOTE — ED Triage Notes (Signed)
Pt brought in by [facility] from [facility] in [place] with c/o left arm pain  Pt states she broke it about a year ago and pain is worse for the past 4 days

## 2022-01-19 NOTE — ED Triage Notes (Signed)
Per Pt- pt c/o "unbearable" arm pain intermittent since yesterday. Reports fall tonight due to dizziness, unsure how she landed.  Pt reports taking pain medication (Norco) today, unsure when , "the facility takes care of that for me."

## 2022-01-20 ENCOUNTER — Emergency Department (HOSPITAL_BASED_OUTPATIENT_CLINIC_OR_DEPARTMENT_OTHER)
Admission: EM | Admit: 2022-01-20 | Discharge: 2022-01-20 | Disposition: A | Discharge status: Skilled Nursing Facility | Payer: Medicare Other | Attending: Emergency Medicine | Admitting: Emergency Medicine

## 2022-01-20 ENCOUNTER — Emergency Department (HOSPITAL_BASED_OUTPATIENT_CLINIC_OR_DEPARTMENT_OTHER): Payer: Medicare Other

## 2022-01-20 ENCOUNTER — Other Ambulatory Visit: Payer: Self-pay

## 2022-01-20 DIAGNOSIS — M79602 Pain in left arm: Secondary | ICD-10-CM

## 2022-01-20 DIAGNOSIS — W19XXXA Unspecified fall, initial encounter: Secondary | ICD-10-CM

## 2022-01-20 LAB — COMPREHENSIVE METABOLIC PANEL
ALT: 11 U/L (ref 0–44)
AST: 16 U/L (ref 15–41)
Albumin: 4 g/dL (ref 3.5–5.0)
Alkaline Phosphatase: 49 U/L (ref 38–126)
Anion gap: 7 (ref 5–15)
BUN: 22 mg/dL (ref 8–23)
CO2: 30 mmol/L (ref 22–32)
Calcium: 9.4 mg/dL (ref 8.9–10.3)
Chloride: 102 mmol/L (ref 98–111)
Creatinine, Ser: 1.07 mg/dL — ABNORMAL HIGH (ref 0.44–1.00)
GFR, Estimated: 57 mL/min — ABNORMAL LOW (ref >60)
Glucose, Bld: 89 mg/dL (ref 70–99)
Potassium: 4 mmol/L (ref 3.5–5.1)
Sodium: 139 mmol/L (ref 135–145)
Total Bilirubin: 0.7 mg/dL (ref 0.3–1.2)
Total Protein: 7.4 g/dL (ref 6.5–8.1)

## 2022-01-20 LAB — CBC
HCT: 34.8 % — ABNORMAL LOW (ref 36.0–46.0)
Hemoglobin: 10.7 g/dL — ABNORMAL LOW (ref 12.0–15.0)
MCH: 25.9 pg — ABNORMAL LOW (ref 26.0–34.0)
MCHC: 30.7 g/dL (ref 30.0–36.0)
MCV: 84.3 fL (ref 80.0–100.0)
Platelets: 143 10*3/uL — ABNORMAL LOW (ref 150–400)
RBC: 4.13 MIL/uL (ref 3.87–5.11)
RDW: 16 % — ABNORMAL HIGH (ref 11.5–15.5)
WBC: 8.7 10*3/uL (ref 4.0–10.5)
nRBC: 0 % (ref 0.0–0.2)

## 2022-01-20 LAB — URINALYSIS, ROUTINE W REFLEX MICROSCOPIC
Bilirubin Urine: NEGATIVE
Glucose, UA: NEGATIVE mg/dL
Hgb urine dipstick: NEGATIVE
Ketones, ur: NEGATIVE mg/dL
Nitrite: NEGATIVE
Protein, ur: NEGATIVE mg/dL
Specific Gravity, Urine: 1.02 (ref 1.005–1.030)
pH: 7 (ref 5.0–8.0)

## 2022-01-20 LAB — URINALYSIS, MICROSCOPIC (REFLEX)

## 2022-01-20 MED ORDER — HYDROCODONE-ACETAMINOPHEN 5-325 MG PO TABS
1.0000 | ORAL_TABLET | Freq: Once | ORAL | Status: AC
  Administered 2022-01-20: 1 via ORAL
  Filled 2022-01-20: qty 1

## 2022-01-20 NOTE — ED Provider Notes (Signed)
Patient with multiple arthralgia complaints.  No signs of acute traumatic injury.  No signs of septic joint.  This all appears to be in a chronic process.  She is now requesting a shoulder sling.  She can follow-up with orthopedics as an outpatient   Ripley Fraise, MD 01/20/22 [phone]

## 2022-01-20 NOTE — Discharge Instructions (Signed)

## 2022-01-20 NOTE — ED Provider Notes (Signed)
[facility] DEPART[facility] Provider Note   CSN: [id number] Arrival date & time: 01/19/22  2230     History  Chief Complaint  Patient presents with   Arm Pain    [name] Carr is a 66 y.o. female.  The history is provided by the patient.  Patient with history of lupus, chronic pain presents with multiple complaints.  Patient lives in a nursing facility.  She reports persistent pain in her left arm.  Patient reports she feels dizzy.  Patient thinks she fell but is unsure if she reinjured her arm.  She also reports headache and neck pain.  No fevers or vomiting.     Home Medications Prior to Admission medications   Medication Sig Start Date End Date Taking? Authorizing Provider  ACCU-CHEK AVIVA PLUS test strip daily. Patient not taking: Reported on 12/20/2021 12/21/19   [provider]  Accu-Chek FastClix Lancets MISC Apply 1 each topically daily. Patient not taking: Reported on 12/20/2021 12/21/19   [provider]  acetaminophen (TYLENOL) 500 MG tablet Take by mouth. 12/06/19   [provider]  atorvastatin (LIPITOR) 20 MG tablet Take 20 mg by mouth daily. 12/21/19   [provider]  brimonidine (ALPHAGAN) 0.2 % ophthalmic solution Place 1 drop into the right eye 3 (three) times daily. Patient not taking: Reported on 12/20/2021 10/20/19   [provider]  cariprazine (VRAYLAR) 1.5 MG capsule Take by mouth daily.    [provider]  clonazePAM (KLONOPIN) 0.5 MG tablet Take 1 tablet (0.5 mg total) by mouth 2 (two) times daily. 09/07/17   Shelly Coss, MD  diphenhydrAMINE (BENADRYL) 50 MG tablet Take 1 tablet (50 mg total) by mouth once for 1 dose. Take one tablet (50 mg) one hour prior to CT scan. 01/19/22 01/19/22  Volanda Napoleon, MD  docusate sodium (COLACE) 100 MG capsule Take 100 mg by mouth 2 (two) times daily.    [provider]  dorzolamide (TRUSOPT) 2 % ophthalmic solution Place 1 drop into  the right eye 3 (three) times daily. Patient not taking: Reported on 12/20/2021 10/20/19   [provider]  doxycycline (ADOXA) 100 MG tablet Take 100 mg by mouth 2 (two) times daily.    [provider]  DULoxetine (CYMBALTA) 60 MG capsule Take 1 capsule (60 mg total) by mouth 2 (two) times daily. 06/11/12   Rowe Clack, MD  furosemide (LASIX) 20 MG tablet Take 20 mg by mouth daily.    [provider]  gabapentin (NEURONTIN) 800 MG tablet Take 800 mg by mouth 3 (three) times daily.    [provider]  hydrochlorothiazide (HYDRODIURIL) 25 MG tablet Take 25 mg by mouth daily. Patient not taking: Reported on 12/20/2021    [provider]  HYDROcodone-acetaminophen (NORCO) 10-325 MG tablet Take 1 tablet by mouth every 6 (six) hours as needed. 08/25/17   [provider]  JANUVIA 100 MG tablet Take 100 mg by mouth daily. 11/21/19   [provider]  LACTOBACILLUS ACID-PECTIN PO Take 1 tablet by mouth daily.    [provider]  lidocaine (LIDODERM) 5 % Place onto the skin. Patient not taking: Reported on 12/20/2021    [provider]  Magnesium Oxide 400 (240 Mg) MG TABS Take 1 tablet by mouth daily. 12/19/19   [provider]  methadone (DOLOPHINE) 5 MG tablet Take 1 tablet (5 mg total) by mouth 2 (two) times daily. 09/07/17   Shelly Coss, MD  metoprolol tartrate (  LOPRESSOR) 25 MG tablet Take 25 mg by mouth 2 (two) times daily.    [provider]  NARCAN 4 MG/0.1ML LIQD nasal spray kit SMARTSIG:1 Spray(s) Both Nares Once PRN Patient not taking: Reported on 01/20/2020 08/23/19   [provider]  nystatin (MYCOSTATIN/NYSTOP) powder Apply twice daily to affected area Patient not taking: Reported on 01/20/2020 09/14/16   [provider]  ondansetron (ZOFRAN) 8 MG tablet Take 8 mg by mouth 3 (three) times daily as needed for nausea or vomiting.  Patient not taking: Reported on 01/20/2020     [provider]  pantoprazole (PROTONIX) 40 MG tablet Take 40 mg by mouth 2 (two) times daily.    [provider]  Polyethyl Glycol-Propyl Glycol (SYSTANE) 0.4-0.3 % SOLN Apply 2 drops to eye 3 (three) times daily.    [provider]  potassium chloride (KLOR-CON) 20 MEQ packet Take 20 mEq by mouth daily.    [provider]  predniSONE (DELTASONE) 10 MG tablet Take 10 mg by mouth daily with breakfast.    [provider]  predniSONE (DELTASONE) 50 MG tablet Take 50 mg (one tablet) 13,7 and 1 hours prior to CT scan 01/19/22   Volanda Napoleon, MD  promethazine (PHENERGAN) 25 MG tablet Take 25 mg by mouth every 6 (six) hours as needed for nausea or vomiting.    [provider]  rOPINIRole (REQUIP) 2 MG tablet Take 2 mg by mouth at bedtime.    [provider]  senna-docusate (SENOKOT-S) 8.6-50 MG per tablet Take 2 tablets by mouth 2 (two) times daily. Patient not taking: Reported on 12/20/2021    [provider]  spironolactone (ALDACTONE) 25 MG tablet Take 50 mg by mouth daily. 2 tablets Patient not taking: Reported on 12/20/2021    [provider]  tiZANidine (ZANAFLEX) 4 MG tablet Take by mouth. Patient not taking: Reported on 01/20/2020 04/21/19   [provider]  traZODone (DESYREL) 50 MG tablet TAKE 1 TABLET(50 MG) BY MOUTH AT BEDTIME AS NEEDED FOR SLEEP 12/31/18   Celso Amy, NP      Allergies    Ammonia, Bee venom, Contrast media [iodinated contrast media], Infliximab, Iohexol, Methotrexate, Midazolam hcl, Nalbuphine, Pentazocine, Tetracyclines & related, Ketorolac tromethamine, Midazolam, Morphine and related, Nsaids, Hydroxychloroquine, Penicillins, Rofecoxib, and Celecoxib    Review of Systems   Review of Systems  Musculoskeletal:  Positive for arthralgias and neck pain.  Neurological:  Positive for headaches.    Physical Exam Updated Vital Signs BP (!) 154/70 (BP Location: Right Arm)    Pulse 70   Temp 98.4 F (36.9 C) (Oral)   Resp 16   Ht 1.676 m (_0 )   SpO2 100%   BMI 25.02 kg/m  Physical Exam CONSTITUTIONAL: Elderly, chronically ill-appearing HEAD: Normocephalic/atraumatic EYES: EOMI/PERRL ENMT: Mucous membranes moist SPINE/BACK: Cervical spine tenderness No thoracic or lumbar tenderness CV: S1/S2 noted, no murmurs/rubs/gallops noted LUNGS: Lungs are clear to auscultation bilaterally, no apparent distress Chest-no bruising or crepitus ABDOMEN: soft, nontender NEURO: Pt is awake/alert/appropriate, moves all extremitiesx4.  No facial droop.   EXTREMITIES: pulses normal/equal, full ROM Tenderness noted to left shoulder with decreased range of motion.  Mild tenderness noted to right hip range of motion. All other extremities/joints palpated/ranged and nontender SKIN: warm, color normal Chronic wounds noted to bilateral lower extremities, no signs of secondary cellulitis   ED Results / Procedures / Treatments   Labs (all labs ordered are listed, but only abnormal results are displayed) Labs  Reviewed  COMPREHENSIVE METABOLIC PANEL - Abnormal; Notable for the following components:      Result Value   Creatinine, Ser 1.07 (*)    GFR, Estimated 57 (*)    All other components within normal limits  CBC - Abnormal; Notable for the following components:   Hemoglobin 10.7 (*)    HCT 34.8 (*)    MCH 25.9 (*)    RDW 16.0 (*)    Platelets 143 (*)    All other components within normal limits  URINALYSIS, ROUTINE W REFLEX MICROSCOPIC    EKG EKG Interpretation  Date/Time:  Thursday January 20 2022 04:40:33 EST Ventricular Rate:  62 PR Interval:  157 QRS Duration: 114 QT Interval:  415 QTC Calculation: 422 R Axis:   -6 Text Interpretation: Sinus rhythm Borderline intraventricular conduction delay Low voltage, precordial leads No significant change since last tracing Confirmed by Ripley Fraise ([id number]) on 01/20/2022 5:02:43 AM  Radiology DG Chest Port  1 View  Result Date: 01/20/2022 CLINICAL DATA:  Fall with polytrauma. EXAM: PORTABLE CHEST 1 VIEW; LEFT SHOULDER AP AND TRANSSCAPULAR Y-VIEWS; PORTABLE AP PELVIS 1 VIEW COMPARISON:  CT scan left shoulder 07/26/2021, CT pelvis 07/24/2021, portable chest 07/24/2021 FINDINGS: Left shoulder two views: There is mild osteopenia without acute fracture or dislocation. 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 There is minimal spurring at the [facility] joint. Comparison to the prior study reveals no significant interval change. Portable AP chest: The heart is slightly enlarged. No vascular congestion is seen. Stable mediastinal configuration with mild aortic tortuosity and calcification. The lungs are clear. No pleural effusion is evident. There is mild osteopenia and mild thoracic dextroscoliosis. Comparison to the prior study reveals no significant interval change. Portable AP pelvis: No pelvic or proximal femoral fracture or diastasis is seen. Bone mineralization slightly osteopenic. There is degenerative change of the lumbosacral junction. Extensive bilateral calcified injection granulomas are redemonstrated. Comparison to the prior study reveals no significant interval change. IMPRESSION: 1. No acute fracture or dislocation identified in the left shoulder. 2. No evidence of acute chest disease. 3. No pelvic or proximal femoral fracture or diastasis is visible AP. 4. Osteopenia and degenerative change. Electronically Signed   By: Telford Nab M.D.   On: 01/20/2022 06:24   DG Pelvis Portable  Result Date: 01/20/2022 CLINICAL DATA:  Fall with polytrauma. EXAM: PORTABLE CHEST 1 VIEW; LEFT SHOULDER AP AND TRANSSCAPULAR Y-VIEWS; PORTABLE AP PELVIS 1 VIEW COMPARISON:  CT scan left shoulder 07/26/2021, CT pelvis 07/24/2021, portable chest 07/24/2021 FINDINGS: Left shoulder two views: There is mild osteopenia without acute fracture or dislocation. Severe  glenohumeral degenerative arthrosis is again noted, with subcortical cystic changes and osteophytes. There is minimal spurring at the [facility] joint. Comparison to the prior study reveals no significant interval change. Portable AP chest: The heart is slightly enlarged. No vascular congestion is seen. Stable mediastinal configuration with mild aortic tortuosity and calcification. The lungs are clear. No pleural effusion is evident. There is mild osteopenia and mild thoracic dextroscoliosis. Comparison to the prior study reveals no significant interval change. Portable AP pelvis: No pelvic or proximal femoral fracture or diastasis is seen. Bone mineralization slightly osteopenic. There is degenerative change of the lumbosacral junction. Extensive bilateral calcified injection granulomas are redemonstrated. Comparison to the prior study reveals no significant interval change. IMPRESSION: 1. No acute fracture or dislocation identified in the left shoulder. 2. No evidence of acute chest disease. 3. No pelvic or proximal femoral fracture or  diastasis is visible AP. 4. Osteopenia and degenerative change. Electronically Signed   By: Telford Nab M.D.   On: 01/20/2022 06:24   DG Shoulder Left Port  Result Date: 01/20/2022 CLINICAL DATA:  Fall with polytrauma. EXAM: PORTABLE CHEST 1 VIEW; LEFT SHOULDER AP AND TRANSSCAPULAR Y-VIEWS; PORTABLE AP PELVIS 1 VIEW COMPARISON:  CT scan left shoulder 07/26/2021, CT pelvis 07/24/2021, portable chest 07/24/2021 FINDINGS: Left shoulder two views: There is mild osteopenia without acute fracture or dislocation. 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 There is minimal spurring at the [facility] joint. Comparison to the prior study reveals no significant interval change. Portable AP chest: The heart is slightly enlarged. No vascular congestion is seen. Stable mediastinal configuration with mild aortic tortuosity and calcification.  The lungs are clear. No pleural effusion is evident. There is mild osteopenia and mild thoracic dextroscoliosis. Comparison to the prior study reveals no significant interval change. Portable AP pelvis: No pelvic or proximal femoral fracture or diastasis is seen. Bone mineralization slightly osteopenic. There is degenerative change of the lumbosacral junction. Extensive bilateral calcified injection granulomas are redemonstrated. Comparison to the prior study reveals no significant interval change. IMPRESSION: 1. No acute fracture or dislocation identified in the left shoulder. 2. No evidence of acute chest disease. 3. No pelvic or proximal femoral fracture or diastasis is visible AP. 4. Osteopenia and degenerative change. Electronically Signed   By: Telford Nab M.D.   On: 01/20/2022 06:24   CT HEAD WO CONTRAST  Result Date: 01/20/2022 CLINICAL DATA:  Head trauma, moderate to severe. Fall with dizziness EXAM: CT HEAD WITHOUT CONTRAST CT CERVICAL SPINE WITHOUT CONTRAST TECHNIQUE: Multidetector CT imaging of the head and cervical spine was performed following the standard protocol without intravenous contrast. Multiplanar CT image reconstructions of the cervical spine were also generated. RADIATION DOSE REDUCTION: This exam was performed according to the departmental dose-optimization program which includes automated exposure control, adjustment of the mA and/or kV according to patient size and/or use of iterative reconstruction technique. COMPARISON:  Cervical MRI 12/17/2014 FINDINGS: CT HEAD FINDINGS Brain: No evidence of acute infarction, hemorrhage, hydrocephalus, extra-axial collection or mass lesion/mass effect. Cerebral volume loss, central predominant with stable ventriculomegaly. Vascular: No hyperdense vessel or unexpected calcification. Skull: Normal. Negative for fracture or focal lesion. Sinuses/Orbits: No acute finding. Generalized mucosal thickening in the paranasal sinuses with large defect in  the nasal septum. CT CERVICAL SPINE FINDINGS Alignment: Degenerative anterolisthesis at C3-4 and C4-5 measuring up to 5 mm at C4-5, chronic and degenerative when associated with cervical MRI from 2016, although progressed from that time Skull base and vertebrae: No acute finding C5-C7 solid arthrodesis Soft tissues and spinal canal: No prevertebral fluid or swelling. No visible canal hematoma. Disc levels: Advanced erosive facet arthritis at C2-3 to C4-5 with anterolisthesis at C3-4 and C4-5. Foraminal narrowing bilaterally at C3-4 and C4-5. Solid arthrodesis at C5-C7 without bony impingement Upper chest: No acute finding IMPRESSION: 1. No evidence of acute intracranial injury or cervical spine fracture. 2. 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 Solid arthrodesis at C5-C7. Electronically Signed   By: Jorje Guild M.D.   On: 01/20/2022 05:54   CT CERVICAL SPINE WO CONTRAST  Result Date: 01/20/2022 CLINICAL DATA:  Head trauma, moderate to severe. Fall with dizziness EXAM: CT HEAD WITHOUT CONTRAST CT CERVICAL SPINE WITHOUT CONTRAST TECHNIQUE: Multidetector CT imaging of the head and cervical spine was performed following the standard protocol without intravenous contrast. Multiplanar CT image  reconstructions of the cervical spine were also generated. RADIATION DOSE REDUCTION: This exam was performed according to the departmental dose-optimization program which includes automated exposure control, adjustment of the mA and/or kV according to patient size and/or use of iterative reconstruction technique. COMPARISON:  Cervical MRI 12/17/2014 FINDINGS: CT HEAD FINDINGS Brain: No evidence of acute infarction, hemorrhage, hydrocephalus, extra-axial collection or mass lesion/mass effect. Cerebral volume loss, central predominant with stable ventriculomegaly. Vascular: No hyperdense vessel or unexpected calcification. Skull: Normal. Negative for fracture or focal lesion. Sinuses/Orbits: No  acute finding. Generalized mucosal thickening in the paranasal sinuses with large defect in the nasal septum. CT CERVICAL SPINE FINDINGS Alignment: Degenerative anterolisthesis at C3-4 and C4-5 measuring up to 5 mm at C4-5, chronic and degenerative when associated with cervical MRI from 2016, although progressed from that time Skull base and vertebrae: No acute finding C5-C7 solid arthrodesis Soft tissues and spinal canal: No prevertebral fluid or swelling. No visible canal hematoma. Disc levels: Advanced erosive facet arthritis at C2-3 to C4-5 with anterolisthesis at C3-4 and C4-5. Foraminal narrowing bilaterally at C3-4 and C4-5. Solid arthrodesis at C5-C7 without bony impingement Upper chest: No acute finding IMPRESSION: 1. No evidence of acute intracranial injury or cervical spine fracture. 2. 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 Solid arthrodesis at C5-C7. Electronically Signed   By: Jorje Guild M.D.   On: 01/20/2022 05:54    Procedures Procedures    Medications Ordered in ED Medications  HYDROcodone-acetaminophen (NORCO/VICODIN) 5-325 MG per tablet 1 tablet (1 tablet Oral Given 01/20/22 0518)    ED Course/ Medical Decision Making/ A&P Clinical Course as of 01/20/22 0647  Thu Jan 20, 2022  0645 Labs overall reassuring.  All imaging is negative.  No signs of acute traumatic injury.  Patient is safe for discharge back to facility [DW]  [phone] Patient is a very poor historian, it is unclear when she actually fell.  Due to her unclear history, imaging was ordered.  No signs of acute traumatic injury.  Patient be discharged home to facility [DW]    Clinical Course User Index [DW] Ripley Fraise, MD                           Medical Decision Making Amount and/or Complexity of Data Reviewed Labs: ordered. Radiology: ordered. ECG/medicine tests: ordered.  Risk Prescription drug management.   This patient presents to the ED for concern of fall and  dizziness, this involves an extensive number of treatment options, and is a complaint that carries with it a high risk of complications and morbidity.  The differential diagnosis includes but is not limited to CVA, intracranial hemorrhage, subdural hematoma, acute coronary syndrome, renal failure, urinary tract infection, electrolyte disturbance, pneumonia    Comorbidities that complicate the patient evaluation: Patient's presentation is complicated by their history of chronic pain  Social Determinants of Health: Patient's  resides in a nursing facility   increases the complexity of managing their presentation  Additional history obtained: Records reviewed previous admission documents  Lab Tests: I Ordered, and personally interpreted labs.  The pertinent results include: Labs unremarkable  Imaging Studies ordered: I ordered imaging studies including CT scan head and C-spine and X-ray extremity films   I independently visualized and interpreted imaging which showed no acute findings I agree with the radiologist interpretation   Medicines ordered and prescription drug management: I ordered medication including Vicodin for pain Reevaluation of the patient after these medicines showed that  the patient    stayed the same   Reevaluation: After the interventions noted above, I reevaluated the patient and found that they have :stayed the same  Complexity of problems addressed: Patient's presentation is most consistent with  acute presentation with potential threat to life or bodily function  Disposition: After consideration of the diagnostic results and the patient's response to treatment,  I feel that the patent would benefit from discharge   .           Final Clinical Impression(s) / ED Diagnoses Final diagnoses:  Fall, initial encounter  Left arm pain    Rx / DC Orders ED Discharge Orders     None         Ripley Fraise, MD 01/20/22 [phone]

## 2022-01-25 ENCOUNTER — Encounter: Payer: Self-pay | Admitting: Oncology

## 2022-01-28 ENCOUNTER — Ambulatory Visit (HOSPITAL_BASED_OUTPATIENT_CLINIC_OR_DEPARTMENT_OTHER): Admission: RE | Admit: 2022-01-28 | Payer: Medicare Other | Source: Ambulatory Visit

## 2022-02-15 ENCOUNTER — Encounter: Payer: Self-pay | Admitting: Oncology

## 2022-02-17 ENCOUNTER — Telehealth: Payer: Self-pay | Admitting: *Deleted

## 2022-02-17 NOTE — Telephone Encounter (Signed)
Call received from patient stating that she would like to cancel CT scan scheduled for tomorrow and would like to talk with Dr. Marin Olp prior to having the CT.  CT scan canceled per pt.'s request and pt given new appt time to see Dr. Marin Olp on 02/24/22 at 12:15PM.  Pt is appreciative of assistance and has no further questions at this time.

## 2022-02-18 ENCOUNTER — Ambulatory Visit (HOSPITAL_BASED_OUTPATIENT_CLINIC_OR_DEPARTMENT_OTHER): Payer: 59

## 2022-02-23 ENCOUNTER — Other Ambulatory Visit: Payer: Self-pay

## 2022-02-23 DIAGNOSIS — C50011 Malignant neoplasm of nipple and areola, right female breast: Secondary | ICD-10-CM

## 2022-02-24 ENCOUNTER — Inpatient Hospital Stay: Payer: 59 | Attending: Hematology & Oncology

## 2022-02-24 ENCOUNTER — Inpatient Hospital Stay: Payer: 59 | Admitting: Hematology & Oncology

## 2022-03-17 ENCOUNTER — Other Ambulatory Visit: Payer: Self-pay

## 2022-03-17 ENCOUNTER — Inpatient Hospital Stay: Payer: 59 | Attending: Hematology & Oncology

## 2022-03-17 ENCOUNTER — Inpatient Hospital Stay: Payer: 59

## 2022-03-17 ENCOUNTER — Inpatient Hospital Stay (HOSPITAL_BASED_OUTPATIENT_CLINIC_OR_DEPARTMENT_OTHER): Payer: 59 | Admitting: Hematology & Oncology

## 2022-03-17 ENCOUNTER — Encounter: Payer: Self-pay | Admitting: Hematology & Oncology

## 2022-03-17 VITALS — BP 123/45 | HR 64 | Temp 99.6°F | Resp 18 | Ht 66.14 in

## 2022-03-17 DIAGNOSIS — R911 Solitary pulmonary nodule: Secondary | ICD-10-CM | POA: Diagnosis not present

## 2022-03-17 DIAGNOSIS — Z853 Personal history of malignant neoplasm of breast: Secondary | ICD-10-CM | POA: Insufficient documentation

## 2022-03-17 DIAGNOSIS — Z08 Encounter for follow-up examination after completed treatment for malignant neoplasm: Secondary | ICD-10-CM | POA: Insufficient documentation

## 2022-03-17 DIAGNOSIS — D509 Iron deficiency anemia, unspecified: Secondary | ICD-10-CM | POA: Diagnosis not present

## 2022-03-17 DIAGNOSIS — D500 Iron deficiency anemia secondary to blood loss (chronic): Secondary | ICD-10-CM

## 2022-03-17 DIAGNOSIS — C50011 Malignant neoplasm of nipple and areola, right female breast: Secondary | ICD-10-CM

## 2022-03-17 DIAGNOSIS — K623 Rectal prolapse: Secondary | ICD-10-CM | POA: Insufficient documentation

## 2022-03-17 DIAGNOSIS — G8929 Other chronic pain: Secondary | ICD-10-CM | POA: Diagnosis not present

## 2022-03-17 LAB — FERRITIN: Ferritin: 7 ng/mL — ABNORMAL LOW (ref 11–307)

## 2022-03-17 LAB — CMP (CANCER CENTER ONLY)
ALT: 9 U/L (ref 0–44)
AST: 12 U/L — ABNORMAL LOW (ref 15–41)
Albumin: 4 g/dL (ref 3.5–5.0)
Alkaline Phosphatase: 43 U/L (ref 38–126)
Anion gap: 8 (ref 5–15)
BUN: 21 mg/dL (ref 8–23)
CO2: 29 mmol/L (ref 22–32)
Calcium: 8.9 mg/dL (ref 8.9–10.3)
Chloride: 102 mmol/L (ref 98–111)
Creatinine: 1.19 mg/dL — ABNORMAL HIGH (ref 0.44–1.00)
GFR, Estimated: 50 mL/min — ABNORMAL LOW (ref >60)
Glucose, Bld: 139 mg/dL — ABNORMAL HIGH (ref 70–99)
Potassium: 3.9 mmol/L (ref 3.5–5.1)
Sodium: 139 mmol/L (ref 135–145)
Total Bilirubin: 0.5 mg/dL (ref 0.3–1.2)
Total Protein: 6.5 g/dL (ref 6.5–8.1)

## 2022-03-17 LAB — IRON AND IRON BINDING CAPACITY (CC-WL,HP ONLY)
Iron: 52 ug/dL (ref 28–170)
Saturation Ratios: 14 % (ref 10.4–31.8)
TIBC: 361 ug/dL (ref 250–450)
UIBC: 309 ug/dL (ref 148–442)

## 2022-03-17 LAB — CBC WITH DIFFERENTIAL (CANCER CENTER ONLY)
Abs Immature Granulocytes: 0.04 10*3/uL (ref 0.00–0.07)
Basophils Absolute: 0 10*3/uL (ref 0.0–0.1)
Basophils Relative: 1 %
Eosinophils Absolute: 0.1 10*3/uL (ref 0.0–0.5)
Eosinophils Relative: 2 %
HCT: 32.7 % — ABNORMAL LOW (ref 36.0–46.0)
Hemoglobin: 9.9 g/dL — ABNORMAL LOW (ref 12.0–15.0)
Immature Granulocytes: 1 %
Lymphocytes Relative: 22 %
Lymphs Abs: 1.6 10*3/uL (ref 0.7–4.0)
MCH: 26.4 pg (ref 26.0–34.0)
MCHC: 30.3 g/dL (ref 30.0–36.0)
MCV: 87.2 fL (ref 80.0–100.0)
Monocytes Absolute: 0.7 10*3/uL (ref 0.1–1.0)
Monocytes Relative: 10 %
Neutro Abs: 4.6 10*3/uL (ref 1.7–7.7)
Neutrophils Relative %: 64 %
Platelet Count: 153 10*3/uL (ref 150–400)
RBC: 3.75 MIL/uL — ABNORMAL LOW (ref 3.87–5.11)
RDW: 16.2 % — ABNORMAL HIGH (ref 11.5–15.5)
WBC Count: 7.1 10*3/uL (ref 4.0–10.5)
nRBC: 0 % (ref 0.0–0.2)

## 2022-03-17 NOTE — Progress Notes (Signed)
Hematology and Oncology Follow Up Visit  [name] Carr [id number] [birth year] 67 y.o. 03/17/2022   Principle Diagnosis:  Metastatic breast cancer Right lower lobe pulmonary nodule  Current Therapy:   Observation   Interim History:  [name] Carr is here today for follow-up.  We last saw her back in December I think.  She is in a wheelchair.  She lives at a nursing home.  She has bad arthritis.  She recently had a rectal prolapse.  She said that she had a lot of blood per rectum when she had a bowel movement.  She says that she posterior "anus" back open to her inside..  She has had a lot of vomiting.  She is going to see gastroenterology for this next week.  When we last saw her, we want to get scans on her to assess the status of her breast cancer.  However, she was not can be able to get IV contrast.  She does have a pulmonary nodule that we have to watch.  She has chronic pain issues.  I know that she is on pain medication for this.  She has had bleeding from the rectal prolapse.  I suspect that she is probably iron deficient.  We need to see what her iron levels are.  She probably has some renal insufficiency.  She is little bit anemic.  We will have to see if she has erythropoietin deficiency.  She is eating okay although she throws up whenever she eats.  She has had no fever.  She has had no problems with COVID recently.  Overall, I would have said that her performance status is probably ECOG 2 at best.    Medications:  Allergies as of 03/17/2022       Reactions   Ammonia Shortness Of Breath, Other (See Comments)   Other reaction(s): ASTHMA Ended up on ventilator from ammonia tabs   Bee Venom Anaphylaxis   Contrast Media [iodinated Contrast Media] Other (See Comments)   Other reaction(s): DIFFICULTY BREATHING Doesn't breath well.   Infliximab Nausea And Vomiting, Swelling   Other reaction(s): ANAPHYLAXIS   Iohexol Shortness Of Breath      Methotrexate Anaphylaxis,  Swelling   Midazolam Hcl Anaphylaxis   Nalbuphine Other (See Comments), Anaphylaxis   Can't breathe well Other reaction(s): ANAPHYLAXIS   Pentazocine Anaphylaxis   Tetracyclines & Related Anaphylaxis   Ketorolac Tromethamine Other (See Comments)   Injectable does work and pills don't work.   Midazolam Anxiety   Morphine And Related Hives   Nsaids Nausea And Vomiting   Hydroxychloroquine    Penicillins    Has patient had a PCN reaction causing immediate rash, facial/tongue/throat swelling, SOB or lightheadedness with hypotension: No Has patient had a PCN reaction causing severe rash involving mucus membranes or skin necrosis: No Has patient had a PCN reaction that required hospitalization: No Has patient had a PCN reaction occurring within the last 10 years: No If all of the above answers are "NO", then may proceed with Cephalosporin use. Tolerates cephalosporins   Rofecoxib    Celecoxib Rash   Other reaction(s): GI Upset (intolerance)        Medication List        Accurate as of March 17, 2022 11:57 AM. If you have any questions, ask your nurse or doctor.          STOP taking these medications    brimonidine 0.2 % ophthalmic solution Commonly known as: ALPHAGAN Stopped by: Volanda Napoleon, MD  dorzolamide 2 % ophthalmic solution Commonly known as: TRUSOPT Stopped by: Volanda Napoleon, MD   doxycycline 100 MG tablet Commonly known as: ADOXA Stopped by: Volanda Napoleon, MD   hydrochlorothiazide 25 MG tablet Commonly known as: HYDRODIURIL Stopped by: Volanda Napoleon, MD   lidocaine 5 % Commonly known as: LIDODERM Stopped by: Volanda Napoleon, MD   nystatin powder Commonly known as: MYCOSTATIN/NYSTOP Stopped by: Volanda Napoleon, MD   senna-docusate 8.6-50 MG tablet Commonly known as: Senokot-S Stopped by: Volanda Napoleon, MD   spironolactone 25 MG tablet Commonly known as: ALDACTONE Stopped by: Volanda Napoleon, MD   tiZANidine 4 MG tablet Commonly  known as: ZANAFLEX Stopped by: Volanda Napoleon, MD   Vraylar 1.5 MG capsule Generic drug: cariprazine Stopped by: Volanda Napoleon, MD       TAKE these medications    Accu-Chek Aviva Plus test strip Generic drug: glucose blood daily.   Accu-Chek FastClix Lancets Misc Apply 1 each topically daily.   acetaminophen 500 MG tablet Commonly known as: TYLENOL Take by mouth.   atorvastatin 20 MG tablet Commonly known as: LIPITOR Take 20 mg by mouth daily.   busPIRone 10 MG tablet Commonly known as: BUSPAR Take 10 mg by mouth 3 (three) times daily.   clonazePAM 0.5 MG tablet Commonly known as: KLONOPIN Take 1 tablet (0.5 mg total) by mouth 2 (two) times daily.   diphenhydrAMINE 50 MG tablet Commonly known as: BENADRYL Take 1 tablet (50 mg total) by mouth once for 1 dose. Take one tablet (50 mg) one hour prior to CT scan.   docusate sodium 100 MG capsule Commonly known as: COLACE Take 100 mg by mouth 2 (two) times daily.   DULoxetine 60 MG capsule Commonly known as: CYMBALTA Take 1 capsule (60 mg total) by mouth 2 (two) times daily.   furosemide 20 MG tablet Commonly known as: LASIX Take 20 mg by mouth daily.   gabapentin 800 MG tablet Commonly known as: NEURONTIN Take 800 mg by mouth 3 (three) times daily.   HYDROcodone-acetaminophen 10-325 MG tablet Commonly known as: NORCO Take 1 tablet by mouth every 6 (six) hours as needed.   Januvia 100 MG tablet Generic drug: sitaGLIPtin Take 100 mg by mouth daily.   LACTOBACILLUS ACID-PECTIN PO Take 1 tablet by mouth daily.   magnesium oxide 400 (240 Mg) MG tablet Commonly known as: MAG-OX Take 1 tablet by mouth daily.   methadone 5 MG tablet Commonly known as: DOLOPHINE Take 1 tablet (5 mg total) by mouth 2 (two) times daily.   metoprolol tartrate 25 MG tablet Commonly known as: LOPRESSOR Take 25 mg by mouth 2 (two) times daily.   Narcan 4 MG/0.1ML Liqd nasal spray kit Generic drug: naloxone SMARTSIG:1  Spray(s) Both Nares Once PRN   ondansetron 8 MG disintegrating tablet Commonly known as: ZOFRAN-ODT Take 8 mg by mouth 3 (three) times daily.   ondansetron 8 MG tablet Commonly known as: ZOFRAN Take 8 mg by mouth 3 (three) times daily as needed for nausea or vomiting.   pantoprazole 40 MG tablet Commonly known as: PROTONIX Take 40 mg by mouth 2 (two) times daily.   potassium chloride 20 MEQ packet Commonly known as: KLOR-CON Take 20 mEq by mouth daily.   predniSONE 10 MG tablet Commonly known as: DELTASONE Take 10 mg by mouth daily with breakfast.   predniSONE 50 MG tablet Commonly known as: DELTASONE Take 50 mg (one tablet) 13,7 and 1 hours prior to  CT scan   promethazine 25 MG tablet Commonly known as: PHENERGAN Take 25 mg by mouth every 6 (six) hours as needed for nausea or vomiting.   rOPINIRole 2 MG tablet Commonly known as: REQUIP Take 2 mg by mouth at bedtime.   Systane 0.4-0.3 % Soln Generic drug: Polyethyl Glycol-Propyl Glycol Apply 2 drops to eye 3 (three) times daily.   traZODone 50 MG tablet Commonly known as: DESYREL TAKE 1 TABLET(50 MG) BY MOUTH AT BEDTIME AS NEEDED FOR SLEEP        Allergies:  Allergies  Allergen Reactions   Ammonia Shortness Of Breath and Other (See Comments)    Other reaction(s): ASTHMA Ended up on ventilator from ammonia tabs   Bee Venom Anaphylaxis   Contrast Media [Iodinated Contrast Media] Other (See Comments)    Other reaction(s): DIFFICULTY BREATHING Doesn't breath well.   Infliximab Nausea And Vomiting and Swelling    Other reaction(s): ANAPHYLAXIS   Iohexol Shortness Of Breath        Methotrexate Anaphylaxis and Swelling   Midazolam Hcl Anaphylaxis   Nalbuphine Other (See Comments) and Anaphylaxis    Can't breathe well Other reaction(s): ANAPHYLAXIS   Pentazocine Anaphylaxis   Tetracyclines & Related Anaphylaxis   Ketorolac Tromethamine Other (See Comments)    Injectable does work and pills don't work.    Midazolam Anxiety   Morphine And Related Hives   Nsaids Nausea And Vomiting   Hydroxychloroquine    Penicillins     Has patient had a PCN reaction causing immediate rash, facial/tongue/throat swelling, SOB or lightheadedness with hypotension: No Has patient had a PCN reaction causing severe rash involving mucus membranes or skin necrosis: No Has patient had a PCN reaction that required hospitalization: No Has patient had a PCN reaction occurring within the last 10 years: No If all of the above answers are "NO", then may proceed with Cephalosporin use.  Tolerates cephalosporins   Rofecoxib    Celecoxib Rash    Other reaction(s): GI Upset (intolerance)    Past Medical History, Surgical history, Social history, and Family History were reviewed and updated.  Review of Systems: Review of Systems  Constitutional:  Positive for malaise/fatigue.  HENT: Negative.    Eyes: Negative.   Respiratory: Negative.    Cardiovascular: Negative.   Gastrointestinal: Negative.   Genitourinary: Negative.   Musculoskeletal:  Positive for back pain and joint pain.  Skin: Negative.   Neurological: Negative.   Endo/Heme/Allergies: Negative.   Psychiatric/Behavioral:  Positive for depression.      Physical Exam: Her vital signs show a temperature of 98.5.  Pulse 65.  Blood pressure 100/78.  Weight is 165 pounds.  Wt Readings from Last 3 Encounters:  03/08/19 155 lb (70.3 kg)  09/06/18 159 lb 9.6 oz (72.4 kg)  03/08/18 170 lb 6.4 oz (77.3 kg)    Physical Exam Vitals reviewed.  Constitutional:      Comments: This is a chronically ill white female.  She is in no distress.  On her exam, she has bilateral mastectomies.  Her chest walls are without erythema, nodules or warmth.  There is no bilateral axillary adenopathy.  HENT:     Head: Normocephalic and atraumatic.  Eyes:     Pupils: Pupils are equal, round, and reactive to light.  Cardiovascular:     Rate and Rhythm: Normal rate and regular  rhythm.     Heart sounds: Normal heart sounds.  Pulmonary:     Effort: Pulmonary effort is normal.  Breath sounds: Normal breath sounds.  Abdominal:     General: Bowel sounds are normal.     Palpations: Abdomen is soft.  Musculoskeletal:        General: No tenderness or deformity. Normal range of motion.     Cervical back: Normal range of motion.     Comments: She has limited range of motion of the left shoulder.  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  Lymphadenopathy:     Cervical: No cervical adenopathy.  Skin:    General: Skin is warm and dry.     Findings: No erythema or rash.  Neurological:     Mental Status: She is alert and oriented to person, place, and time.  Psychiatric:        Behavior: Behavior normal.        Thought Content: Thought content normal.        Judgment: Judgment normal.      Lab Results  Component Value Date   WBC 7.1 03/17/2022   HGB 9.9 (L) 03/17/2022   HCT 32.7 (L) 03/17/2022   MCV 87.2 03/17/2022   PLT 153 03/17/2022   Lab Results  Component Value Date   FERRITIN 336 (H) 05/07/2014   IRON 20 (L) 05/07/2014   TIBC 321 05/07/2014   UIBC 301 05/07/2014   IRONPCTSAT 6 (L) 05/07/2014   Lab Results  Component Value Date   RETICCTPCT 2.19 (H) 09/27/2011   RBC 3.75 (L) 03/17/2022   RETICCTABS 88.48 09/27/2011   No results found for: "KPAFRELGTCHN", "LAMBDASER", "KAPLAMBRATIO" No results found for: "IGGSERUM", "IGA", "IGMSERUM" No results found for: "TOTALPROTELP", "ALBUMINELP", "A1GS", "A2GS", "BETS", "BETA2SER", "GAMS", "MSPIKE", "SPEI"   Chemistry      Component Value Date/Time   NA 139 03/17/2022 1017   NA 138 07/27/2016 1427   NA 135 (L) 04/08/2013 1148   K 3.9 03/17/2022 1017   K 4.4 07/27/2016 1427   K 4.6 04/08/2013 1148   CL 102 03/17/2022 1017   CL 102 07/27/2016 1427   CL 91 (L) 03/15/2012 1410   CO2 29 03/17/2022 1017   CO2 27 07/27/2016 1427   CO2 28 04/08/2013 1148   BUN 21 03/17/2022 1017   BUN 13  07/27/2016 1427   BUN 29.9 (H) 04/08/2013 1148   CREATININE 1.19 (H) 03/17/2022 1017   CREATININE 1.0 07/27/2016 1427   CREATININE 1.2 (H) 04/08/2013 1148   GLU 116 05/05/2016 0000      Component Value Date/Time   CALCIUM 8.9 03/17/2022 1017   CALCIUM 9.0 07/27/2016 1427   CALCIUM 9.6 04/08/2013 1148   ALKPHOS 43 03/17/2022 1017   ALKPHOS 62 07/27/2016 1427   ALKPHOS 100 04/08/2013 1148   AST 12 (L) 03/17/2022 1017   AST 17 04/08/2013 1148   ALT 9 03/17/2022 1017   ALT 23 07/27/2016 1427   ALT 18 04/08/2013 1148   BILITOT 0.5 03/17/2022 1017   BILITOT 0.34 04/08/2013 1148      Impression and Plan: [name] Carr is a very pleasant 67 yo caucasian female with metastatic breast cancer.  I would not think that metastatic breast cancer would be a problem.  When we last saw her, her CA 27.29 was actually pretty stable at 24.  We will see what it is this time.  I am more worried about her anemia.  I suspect this might be multifactorial anemia.  We will see what her iron levels are.  She clearly needs to have the Gastroenterologist see her for this vomiting  and the rectal prolapse.  She might need surgery for the rectal prolapse.  She agrees to the CT scan.  We will have her go downstairs today and have the scan set up.  Hopefully this will be done next week.  I will have to get her back in about a month so we can monitor her blood counts.      Volanda Napoleon, MD 2/8/202411:57 AM

## 2022-03-18 ENCOUNTER — Encounter: Payer: Self-pay | Admitting: Oncology

## 2022-03-18 ENCOUNTER — Encounter: Payer: Self-pay | Admitting: *Deleted

## 2022-03-18 LAB — ERYTHROPOIETIN: Erythropoietin: 58.7 m[IU]/mL — ABNORMAL HIGH (ref 2.6–18.5)

## 2022-03-18 LAB — CANCER ANTIGEN 27.29: CA 27.29: 19.6 U/mL (ref 0.0–38.6)

## 2022-03-18 NOTE — Addendum Note (Signed)
Addended by: Volanda Napoleon on: 03/18/2022 06:25 AM   Modules accepted: Orders

## 2022-03-21 ENCOUNTER — Encounter: Payer: Self-pay | Admitting: Oncology

## 2022-03-23 ENCOUNTER — Ambulatory Visit (HOSPITAL_BASED_OUTPATIENT_CLINIC_OR_DEPARTMENT_OTHER): Payer: 59

## 2022-03-29 ENCOUNTER — Inpatient Hospital Stay: Payer: 59

## 2022-03-29 VITALS — BP 116/59 | HR 68 | Temp 98.3°F | Resp 18

## 2022-03-29 DIAGNOSIS — Z08 Encounter for follow-up examination after completed treatment for malignant neoplasm: Secondary | ICD-10-CM | POA: Diagnosis not present

## 2022-03-29 DIAGNOSIS — D500 Iron deficiency anemia secondary to blood loss (chronic): Secondary | ICD-10-CM

## 2022-03-29 MED ORDER — SODIUM CHLORIDE 0.9 % IV SOLN: INTRAVENOUS | Status: DC

## 2022-03-29 MED ORDER — SODIUM CHLORIDE 0.9 % IV SOLN
510.0000 mg | Freq: Once | INTRAVENOUS | Status: AC
  Administered 2022-03-29: 510 mg via INTRAVENOUS
  Filled 2022-03-29: qty 510

## 2022-03-29 NOTE — Patient Instructions (Signed)

## 2022-04-04 ENCOUNTER — Ambulatory Visit (HOSPITAL_BASED_OUTPATIENT_CLINIC_OR_DEPARTMENT_OTHER)
Admission: RE | Admit: 2022-04-04 | Discharge: 2022-04-04 | Disposition: A | Discharge status: Home / Self Care | Payer: 59 | Source: Ambulatory Visit | Attending: Hematology & Oncology | Admitting: Hematology & Oncology

## 2022-04-04 DIAGNOSIS — C50011 Malignant neoplasm of nipple and areola, right female breast: Secondary | ICD-10-CM | POA: Insufficient documentation

## 2022-04-04 DIAGNOSIS — D500 Iron deficiency anemia secondary to blood loss (chronic): Secondary | ICD-10-CM | POA: Diagnosis present

## 2022-04-05 ENCOUNTER — Encounter: Payer: Self-pay | Admitting: *Deleted

## 2022-04-06 ENCOUNTER — Inpatient Hospital Stay: Payer: 59

## 2022-04-06 VITALS — BP 121/49 | HR 60 | Temp 98.2°F | Resp 19

## 2022-04-06 DIAGNOSIS — D500 Iron deficiency anemia secondary to blood loss (chronic): Secondary | ICD-10-CM

## 2022-04-06 DIAGNOSIS — Z08 Encounter for follow-up examination after completed treatment for malignant neoplasm: Secondary | ICD-10-CM | POA: Diagnosis not present

## 2022-04-06 MED ORDER — SODIUM CHLORIDE 0.9 % IV SOLN
510.0000 mg | Freq: Once | INTRAVENOUS | Status: AC
  Administered 2022-04-06: 510 mg via INTRAVENOUS
  Filled 2022-04-06: qty 17

## 2022-04-06 MED ORDER — SODIUM CHLORIDE 0.9 % IV SOLN: Freq: Once | INTRAVENOUS | Status: AC

## 2022-04-06 NOTE — Patient Instructions (Signed)

## 2022-04-14 ENCOUNTER — Ambulatory Visit: Payer: 59 | Admitting: Hematology & Oncology

## 2022-04-14 ENCOUNTER — Ambulatory Visit: Payer: 59

## 2022-04-14 ENCOUNTER — Inpatient Hospital Stay: Payer: 59

## 2022-05-12 ENCOUNTER — Inpatient Hospital Stay: Payer: 59 | Admitting: Hematology & Oncology

## 2022-05-12 ENCOUNTER — Inpatient Hospital Stay: Payer: 59 | Attending: Hematology & Oncology

## 2022-05-12 ENCOUNTER — Inpatient Hospital Stay: Payer: 59

## 2022-05-24 ENCOUNTER — Encounter: Payer: Self-pay | Admitting: Hematology & Oncology

## 2022-06-08 DEATH — deceased
